# Patient Record
Sex: Female | Born: 1935
Health system: Southern US, Community
[De-identification: ages and names within clinical notes are randomized; demographics above are authoritative.]

## PROBLEM LIST (undated history)

## (undated) DIAGNOSIS — M545 Low back pain, unspecified: Secondary | ICD-10-CM

## (undated) DIAGNOSIS — E785 Hyperlipidemia, unspecified: Secondary | ICD-10-CM

## (undated) DIAGNOSIS — I872 Venous insufficiency (chronic) (peripheral): Secondary | ICD-10-CM

## (undated) DIAGNOSIS — F419 Anxiety disorder, unspecified: Secondary | ICD-10-CM

## (undated) DIAGNOSIS — I10 Essential (primary) hypertension: Secondary | ICD-10-CM

## (undated) DIAGNOSIS — I440 Atrioventricular block, first degree: Secondary | ICD-10-CM

## (undated) DIAGNOSIS — R42 Dizziness and giddiness: Secondary | ICD-10-CM

## (undated) DIAGNOSIS — I6529 Occlusion and stenosis of unspecified carotid artery: Secondary | ICD-10-CM

## (undated) HISTORY — PX: CHOLECYSTECTOMY: SHX55

## (undated) HISTORY — DX: Hyperlipidemia, unspecified: E78.5

## (undated) HISTORY — PX: BREAST EXCISIONAL BIOPSY: SUR124

## (undated) HISTORY — DX: Occlusion and stenosis of unspecified carotid artery: I65.29

## (undated) HISTORY — DX: Essential (primary) hypertension: I10

## (undated) HISTORY — PX: EYE SURGERY: SHX253

## (undated) HISTORY — PX: BREAST BIOPSY: SHX20

## (undated) HISTORY — DX: Atrioventricular block, first degree: I44.0

## (undated) HISTORY — PX: LUNG SURGERY: SHX703

## (undated) HISTORY — PX: ABDOMINAL HYSTERECTOMY: SHX81

---

## 1998-06-30 ENCOUNTER — Ambulatory Visit (HOSPITAL_COMMUNITY): Admission: RE | Admit: 1998-06-30 | Discharge: 1998-06-30 | Payer: Self-pay | Admitting: Family Medicine

## 1998-07-21 ENCOUNTER — Other Ambulatory Visit: Admission: RE | Admit: 1998-07-21 | Discharge: 1998-07-21 | Payer: Self-pay | Admitting: *Deleted

## 1998-08-03 ENCOUNTER — Encounter: Payer: Self-pay | Admitting: Family Medicine

## 1998-08-03 ENCOUNTER — Ambulatory Visit (HOSPITAL_COMMUNITY): Admission: RE | Admit: 1998-08-03 | Discharge: 1998-08-03 | Payer: Self-pay | Admitting: Family Medicine

## 1998-08-10 ENCOUNTER — Encounter: Payer: Self-pay | Admitting: Family Medicine

## 1998-08-10 ENCOUNTER — Ambulatory Visit (HOSPITAL_COMMUNITY): Admission: RE | Admit: 1998-08-10 | Discharge: 1998-08-10 | Payer: Self-pay | Admitting: Family Medicine

## 1999-08-09 ENCOUNTER — Encounter: Payer: Self-pay | Admitting: Family Medicine

## 1999-08-09 ENCOUNTER — Encounter: Admission: RE | Admit: 1999-08-09 | Discharge: 1999-08-09 | Payer: Self-pay | Admitting: Family Medicine

## 2000-01-26 ENCOUNTER — Encounter: Admission: RE | Admit: 2000-01-26 | Discharge: 2000-01-26 | Payer: Self-pay | Admitting: Family Medicine

## 2000-01-26 ENCOUNTER — Encounter: Payer: Self-pay | Admitting: Family Medicine

## 2000-06-12 ENCOUNTER — Other Ambulatory Visit: Admission: RE | Admit: 2000-06-12 | Discharge: 2000-06-12 | Payer: Self-pay | Admitting: *Deleted

## 2000-08-15 ENCOUNTER — Encounter: Admission: RE | Admit: 2000-08-15 | Discharge: 2000-08-15 | Payer: Self-pay | Admitting: Family Medicine

## 2000-08-15 ENCOUNTER — Encounter: Payer: Self-pay | Admitting: Family Medicine

## 2001-03-17 ENCOUNTER — Encounter: Payer: Self-pay | Admitting: Family Medicine

## 2001-03-17 ENCOUNTER — Encounter: Admission: RE | Admit: 2001-03-17 | Discharge: 2001-03-17 | Payer: Self-pay | Admitting: Family Medicine

## 2001-08-28 ENCOUNTER — Encounter: Admission: RE | Admit: 2001-08-28 | Discharge: 2001-08-28 | Payer: Self-pay | Admitting: Family Medicine

## 2001-08-28 ENCOUNTER — Encounter: Payer: Self-pay | Admitting: Family Medicine

## 2002-09-14 ENCOUNTER — Encounter: Admission: RE | Admit: 2002-09-14 | Discharge: 2002-09-14 | Payer: Self-pay | Admitting: Family Medicine

## 2002-09-14 ENCOUNTER — Encounter: Payer: Self-pay | Admitting: Family Medicine

## 2002-09-16 ENCOUNTER — Encounter: Admission: RE | Admit: 2002-09-16 | Discharge: 2002-09-16 | Payer: Self-pay | Admitting: Family Medicine

## 2002-09-16 ENCOUNTER — Encounter: Payer: Self-pay | Admitting: Family Medicine

## 2003-01-13 ENCOUNTER — Encounter: Payer: Self-pay | Admitting: Emergency Medicine

## 2003-01-13 ENCOUNTER — Emergency Department (HOSPITAL_COMMUNITY): Admission: EM | Admit: 2003-01-13 | Discharge: 2003-01-14 | Payer: Self-pay | Admitting: Emergency Medicine

## 2003-01-14 ENCOUNTER — Encounter: Payer: Self-pay | Admitting: General Surgery

## 2003-01-14 ENCOUNTER — Inpatient Hospital Stay (HOSPITAL_COMMUNITY): Admission: EM | Admit: 2003-01-14 | Discharge: 2003-01-17 | Payer: Self-pay | Admitting: Emergency Medicine

## 2003-04-22 ENCOUNTER — Other Ambulatory Visit: Admission: RE | Admit: 2003-04-22 | Discharge: 2003-04-22 | Payer: Self-pay | Admitting: Obstetrics & Gynecology

## 2003-09-23 ENCOUNTER — Encounter: Admission: RE | Admit: 2003-09-23 | Discharge: 2003-09-23 | Payer: Self-pay | Admitting: Family Medicine

## 2004-09-27 ENCOUNTER — Encounter: Admission: RE | Admit: 2004-09-27 | Discharge: 2004-09-27 | Payer: Self-pay | Admitting: Family Medicine

## 2005-04-19 ENCOUNTER — Emergency Department (HOSPITAL_COMMUNITY): Admission: EM | Admit: 2005-04-19 | Discharge: 2005-04-19 | Payer: Self-pay | Admitting: Emergency Medicine

## 2005-10-19 ENCOUNTER — Encounter: Admission: RE | Admit: 2005-10-19 | Discharge: 2005-10-19 | Payer: Self-pay | Admitting: Family Medicine

## 2006-02-19 ENCOUNTER — Ambulatory Visit: Payer: Self-pay | Admitting: Gastroenterology

## 2006-03-26 ENCOUNTER — Ambulatory Visit: Payer: Self-pay | Admitting: Gastroenterology

## 2006-03-26 ENCOUNTER — Encounter (INDEPENDENT_AMBULATORY_CARE_PROVIDER_SITE_OTHER): Payer: Self-pay | Admitting: *Deleted

## 2006-08-16 ENCOUNTER — Ambulatory Visit: Payer: Self-pay | Admitting: Internal Medicine

## 2006-11-05 ENCOUNTER — Encounter: Admission: RE | Admit: 2006-11-05 | Discharge: 2006-11-05 | Payer: Self-pay | Admitting: Family Medicine

## 2007-11-21 ENCOUNTER — Encounter: Admission: RE | Admit: 2007-11-21 | Discharge: 2007-11-21 | Payer: Self-pay | Admitting: Family Medicine

## 2008-11-22 ENCOUNTER — Encounter: Admission: RE | Admit: 2008-11-22 | Discharge: 2008-11-22 | Payer: Self-pay | Admitting: Family Medicine

## 2009-08-03 ENCOUNTER — Ambulatory Visit: Payer: Self-pay | Admitting: Vascular Surgery

## 2009-10-20 ENCOUNTER — Ambulatory Visit: Payer: Self-pay | Admitting: Vascular Surgery

## 2010-01-03 ENCOUNTER — Encounter: Admission: RE | Admit: 2010-01-03 | Discharge: 2010-01-03 | Payer: Self-pay | Admitting: Family Medicine

## 2010-06-07 ENCOUNTER — Ambulatory Visit: Payer: Self-pay | Admitting: Cardiology

## 2010-10-31 NOTE — Procedures (Signed)
LOWER EXTREMITY VENOUS REFLUX EXAM   INDICATION:  Telangiectasia and varicosity of right posterior calf.   EXAM:  Using color-flow imaging and pulse Doppler spectral analysis, the  right common femoral, superficial femoral, popliteal, posterior tibial,  greater and lesser saphenous veins are evaluated.  There is no evidence  suggesting deep venous insufficiency in the right lower extremity.   The right saphenofemoral junction is competent. The right GSV is  competent.   The right proximal short saphenous vein demonstrates competency.   GSV Diameter (used if found to be incompetent only)                                            Right    Left  Proximal Greater Saphenous Vein           cm       cm  Proximal-to-mid-thigh                     cm       cm  Mid thigh                                 cm       cm  Mid-distal thigh                          cm       cm  Distal thigh                              cm       cm  Knee                                      cm       cm   IMPRESSION:  1. The right greater saphenous vein shows no evidence of significant      reflux.  2. The right greater saphenous vein is not aneurysmal.  3. The right greater saphenous vein is not tortuous.  4. The deep venous system is competent on the right.  5. The right lesser saphenous vein is competent.   ___________________________________________  Larina Earthly, M.D.   AS/MEDQ  D:  08/03/2009  T:  08/04/2009  Job:  [id number]

## 2010-10-31 NOTE — Consult Note (Signed)
NEW PATIENT CONSULTATION   SINK, Myda E.  DOB:  January 27, 1935                                       08/03/2009  ZOXWR#:[id number]   The patient presents today for evaluation of lower extremity venous  pathology.  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  She has had multiple prior treatments with sclerotherapy, mainly by Dr.  Marcy Panning over the course of 10-15 years.  She presents today with  recurrent concerns of reticular vein in her posterior right calf which  has been treated in the past and also telangiectasia in both legs and  ankles.  She does not have any specific pain over this area.  She does  have heavy sensation over her ankles bilaterally that is somewhat worse  with standing.  She has not had any prior bleeding from these areas.   PAST MEDICAL HISTORY:  Significant for hypertension.  She does have a  history of oophorectomy and hysterectomy in 1974 and 1976 and also  cholecystectomy in 1985.  She does have a history of mitral valve  prolapse diagnosis in the past.   FAMILY HISTORY:  Is significant for premature atherosclerotic disease in  sister, mother and father.   SOCIAL HISTORY:  She is married.  She is retired.  She has one child.  She does not smoke, having quit in 1970s.  Does have one glass of  alcohol per day.   REVIEW OF SYSTEMS:  Negative for weight loss or weight gain.  She  reports her weight is 150 pounds.  She is 5 feet 5 inches tall.  HEART:  Negative other than the mitral valve prolapse in the past.  PULMONARY:  Negative.  GI:  Hiatal hernia.  GU:  Negative.  VASCULAR:  With discomfort in her legs with prolonged standing.  NEUROLOGIC:  Negative.  PSYCHIATRIC:  Negative.  SKIN:  Does have some prior small cutaneous cancers.   PHYSICAL EXAM.:  General:  A well-developed, well-nourished white female  in no acute distress.  Vital  signs:  Blood pressure is 180/100, heart  rate is 88, respirations 18.  HEENT:  Normal.  Musculoskeletal:  Shows  no major deformities or cyanosis.  Neurological:  No focal weakness or  paresthesias.  Skin:  Without ulcers or rashes.  She does have 2+  dorsalis pedis pulses bilaterally, 2+ radial pulses bilaterally.   She underwent noninvasive vascular lab study in our office.  This does  show no significant reflux in her great or superficial system.  On the  right she does have several small varicosities that are not in  communication with her greater small saphenous vein which is  representing the telangiectasia and reticular vein in her right  posterior calf.  I did discuss options with the patient explaining that  there was no significant danger related to these.  She has had multiple  sessions of sclerotherapy in the past for recurrent telangiectasia and  she is interested in proceeding with this in our office.  I explained  that this was performed by Clementeen Hoof, RN in our office and explained that  we would not use hypertonic saline which has been used by other  practitioners in the past due to the increased risk for staining and  also increased  pain.  She understands the procedure and potential out of  pocket expense and wishes to proceed at her convenience.     Larina Earthly, M.D.  Electronically Signed   TFE/MEDQ  D:  08/03/2009  T:  08/04/2009  Job:  [id number]   cc:   Levander Campion, M.D.

## 2010-11-03 NOTE — Consult Note (Signed)
   NAME:  Robin, [name] NO.:  [id number]   MEDICAL RECORD NO.:  [id number]                   PATIENT TYPE:  INP   LOCATION:  5705                                 FACILITY:  [facility]   PHYSICIAN:  Mark C. Ophelia Charter, M.D.                 DATE OF BIRTH:  1934-10-04   DATE OF CONSULTATION:  01/14/2003  DATE OF DISCHARGE:                                   CONSULTATION   REASON FOR CONSULTATION:  Fall off horse with compression fractures and  right fifth carpal fracture.   HISTORY OF PRESENT ILLNESS:  This 76 year old female was riding a horse when  it went through some bushes, and she was hit by a branch from a tree,  causing her to fall off the horse. She suffered L3 and T12 compression  fractures, less than 30%. She also suffered a right fifth metacarpal  fracture. The patient is neurologically intact. MRI scan has been performed.  There is some ecchymosis of right fingers, but sensation is intact. She has  periorbital ecchymosis on the right secondary to hitting the branch.   MRI scan is reviewed which shows no evidence of epidural hematoma. No cord  compression. The patient has some associated degenerative changes at 4-5  which is pre-existing.   PLAN:  TLSO extension orthosis. She should apply the brace before she gets  up. Once the brace is applied, she can be ambulatory, discontinue Foley,  discontinue IV/p.o. pain medications, followup in one week. X-ray department  is looking for x-rays of the hand from [place], and I would recommend leaving  her in the split, and we can follow up her hand injury next week in the  office.                                               Mark C. Ophelia Charter, M.D.    MCY/MEDQ  D:  01/14/2003  T:  01/15/2003  Job:  [id number]

## 2010-11-03 NOTE — Discharge Summary (Signed)
NAME:  Robin [name], Robin [name] NO.:  [id number]   MEDICAL RECORD NO.:  [id number]                   PATIENT TYPE:  INP   LOCATION:  5705                                 FACILITY:  [facility]   PHYSICIAN:  Jimmye Norman, M.D.                   DATE OF BIRTH:  08-30-1934   DATE OF ADMISSION:  01/14/2003  DATE OF DISCHARGE:  01/17/2003                                 DISCHARGE SUMMARY   FINAL DIAGNOSES:  1. Fall from a horse.  2. Right fifth metacarpal fracture.  3. A T12 compression fracture.  4. An L3 compression fracture.  5. Hypertension.  6. Hypokalemia.   HISTORY:  This is a 76 year old who fell off a horse at about 8:30 p.m. on  the night of admission.  She stated she struck her right face on a tree limb  and then fell off the horse to the left striking her right hand on the  ground first and then landing on the ground face first.  The patient denied  any loss of consciousness.  She stated she had some lower back pain and pain  in her right hand.  Because of these findings, she was brought to [facility]  Emergency Room where she was seen by Dr. Derrell Lolling and workup done.  She had  noted right fifth metacarpal fracture.  She also had questionable T12 and L3  compression fractures.  Subsequently, an MRI was ordered.  While in the  emergency room, Dr. Annell Greening was consulted and he came to see the patient.   HOSPITAL COURSE:  The patient had her right hand splinted by Dr. Ophelia Charter and  also was fitted for a TLSO brace per Dr. Ophelia Charter' orders.  An MRI was done,  which did reveal the L3 compression fracture and T12 compression fracture  with T12 being approximately 20% and L3 being approximately 10%.  The  patient was given pain medicines and she did well during her stay.  Her diet  was advanced to a regular diet as tolerated.  After the brace was applied,  she was told how to use it and she was up and out of bed with the brace on.  She continued to do well and  subsequently by January 17, 2003 she was ready  for discharge.  At this point, it was discussed with her that she needed to  wear the brace at all times while up and out of bed.  She had already been  given an appointment by Dr. Ophelia Charter to follow up with him on Wednesday.  She  was having no other complaints at this time and was doing well.  She did  have some constipation and it had not resolved at the time of discharge but  she was advised that if she did not get any better she should try a bottle  of magnesium citrate at home.  The patient was given a  prescription for  Vicoprofen one or two p.o. q.4-6h. p.r.n. for pain; 40 of these, no refills.  She was told to follow up with Dr. Ophelia Charter  as he has appointed.  She was told to call the trauma office if she should  have any questions or any problems.  There is no other reason to follow up  with her per the trauma service at this time.  The patient is subsequently  discharged home in satisfactory and stable condition.      Phineas Semen, P.A.                      Jimmye Norman, M.D.    CL/MEDQ  D:  01/17/2003  T:  01/18/2003  Job:  [id number]   cc:   Loraine Leriche C. Ophelia Charter, M.D.  [address]  [place], [place] [address]  Fax: [phone]

## 2010-11-03 NOTE — Assessment & Plan Note (Signed)
[facility]                           GASTROENTEROLOGY OFFICE NOTE   Robin [name], Robin [name]                MRN:          [id number]  DATE:02/19/2006                            DOB:          [birth year]    INDICATIONS:  Robin [name] is a 76 year old white female referred for  screening colonoscopy per the courtesy of Dr. Artis Flock.  She really denies GI  complaints.   On close questioning, the patient does have periodic reflux symptoms for  which she takes Tagamet.  She denies diarrhea, constipation, abdominal pain  or rectal bleeding.  She reports that she has done yearly hemoccult cards  which have been negative, had a negative flexible sigmoidoscopy several  years ago.  She never had full colonoscopy exam or barium enema.  Her  appetite is good and her weight is stable and she denies specific food  intolerances.  She has never had hepatitis or known hepatobiliary problems.  She does have mild lactose intolerance.   PAST MEDICAL HISTORY:  1. Remarkable for hypertension and angina pectoris but she denies having      had previous MI or CVAs.  2. She also suffers from mitral valve prolapse and has a history of benign      palpitations.  3. She has what sounds like pneumothorax at age 65 and had to have a      pleural adhesion operation by Dr. Laddie Aquas.  4. She has had previous cholecystectomy.  5. Hysterectomy.  6. Breast biopsy.   FAMILY HISTORY:  Remarkable for diabetes and atherosclerosis but no known  gastrointestinal problems.   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  She does not smoke or use ethanol.   MEDICATIONS:  1. Toprol 200 mg half a tablet twice a day.  2. Dyazide 25 mg a day.  3. Tiazac 180 mg a day.  4. Premarin 0.9 mg a day.  5. Synthroid 0.88 mg a day.  6. Potassium 20 mEq a day.  7. A variety of multivitamins.   She in the past has had reactions  to codeine and sulfa.   REVIEW OF SYSTEMS:  Noncontributory.   LABORATORY DATA:  Recent lab data per Dr. Artis Flock shows a normal CBC and  metabolic profile.   EXAM:  She is a healthy-appearing, attractive white female appearing younger  than her stated age.  I cannot appreciate stigmata of chronic liver disease.  She is 5 feet 5 inches tall and weighs 147 pounds.  Blood pressure is 138/84  and pulse was 90 and regular.  I could not appreciate thyromegaly or  lymphadenopathy.  Chest was entirely clear.  She appeared to be in a regular  rhythm at this time without significant murmurs, gallops or rubs to my exam.  I could not appreciate hepatosplenomegaly, abdominal masses or tenderness.  Peripheral extremities were unremarkable.  Mental status was clear.  Rectal  examination was deferred.   ASSESSMENT:  1. Need for screening colonoscopy because of her age.  2. Periodic acid reflux managed by over-the-counter H2 blockers.  3. Nonessential  hypertension with history of angina pectoris.  4. Status post cholecystectomy and hysterectomy.  5. Vague history of mitral valve prolapse and palpitations.  6. History of lactose intolerance.   RECOMMENDATIONS:  1. Outpatient colonoscopy at her convenience.  2. Continue other medications as per Dr. Artis Flock.  3. Reflux regime review with patient but I do not think she needs      endoscopic exam or regular PPI therapy at this time.                                   Vania Rea. Jarold Motto, MD, Clementeen Graham, [place]   DRP/MedQ  DD:  02/19/2006  DT:  02/19/2006  Job #:  [id number]   cc:   Quita Skye. Artis Flock, M.D.

## 2010-11-03 NOTE — H&P (Signed)
NAME:  Robin [name], Robin [name] NO.:  [id number]   MEDICAL RECORD NO.:  [id number]                   PATIENT TYPE:  INP   LOCATION:  5705                                 FACILITY:  [facility]   PHYSICIAN:  Angelia Mould. Derrell Lolling, M.D.             DATE OF BIRTH:  1935-02-09   DATE OF ADMISSION:  01/14/2003  DATE OF DISCHARGE:                                HISTORY & PHYSICAL   CHIEF COMPLAINT:  Fell from a horse.   HISTORY OF PRESENT ILLNESS:  This is a 76 year old white female who fell off  of a horse at about 8:30 p.m. last night.  She states that she struck her  right face on a tree limb and then fell off the horse to the left, striking  her right hand on the ground first and then landing on the ground face  first.  She denies loss of consciousness.  She states she has some lower  back pain, some pain in her right hand and left thumb.   She was initially evaluated at [facility] ER by Dr. Lynelle Doctor; Dr. Lynelle Doctor felt  that the patient needed to be admitted to the trauma service and transferred  the patient to [facility] ER following x-ray evaluation.  The patient has been  hemodynamically stable throughout and alert throughout.   PAST MEDICAL HISTORY:  1. Hypertension.  2. Remote history of mitral valve prolapse.  3. Hiatal hernia and gastroesophageal reflux disease.  4. She has had a hysterectomy, BSO, and appendectomy.  5. Status post open cholecystectomy.  6. Status post tonsillectomy.  7. Status post right pleurodesis at age 47 for spontaneous pneumothorax.   CURRENT MEDICATIONS:  1. Toprol 100 mg p.o. b.i.d.  2. Diltiazem 180 mg daily.  3. Synthroid 88 mcg daily.  4. Dyazide 25 mg daily.  5. Premarin 1.25 mg daily.   DRUG ALLERGIES:  1. CODEINE.  2. SULFA.  3. IVP DYE.   SOCIAL HISTORY:  The patient is here with her husband and daughter.  She  denies the use of tobacco but admits to drinking alcohol socially.  She has  had a tetanus shot today.   FAMILY  HISTORY:  Mother died of heart disease and hypertension.  Father died  of heart disease and diabetes.   REVIEW OF SYSTEMS:  All systems are reviewed and are noncontributory except  as described above.   PHYSICAL EXAMINATION:  GENERAL:  A pleasant, older-middle-aged woman in  minimal distress.  She is sitting up in bed.  VITAL SIGNS:  Pulse 88, blood pressure 154/32, respirations 20.  SKIN:  Warm and dry.  HEENT:  Normocephalic.  No lacerations.  There is some ecchymosis around the  right periorbital area but no edema.  There is no palpable facial fracture.  Eyes show intact extraocular movements.  Pupils are 3 mm and reactive to  light.  External auditory canals are clear.  There is no laceration of the  scalp or face.  Dental occlusion is good.  No dental injury.  NECK:  Supple and nontender.  There is no posterior tenderness to the neck.  She has good active and passive range of motion without any pain whatsoever.  Trachea is midline.  There is no swelling or crepitus or distended veins.  CHEST:  A well-healed right thoracotomy scar.  No crepitus or chest wall  tenderness.  LUNGS:  Clear.  HEART:  Regular rate and rhythm.  No detectable murmur.  BREASTS:  Not examined.  ABDOMEN:  Soft and nontender.  Active bowel sounds.  Liver and spleen not  enlarged.  No bruising or signs of trauma.  BACK:  She can sit up, and there is mild tenderness to palpate the lumbar  spine but no deformity or swelling.  The rest of the posterior back is  nontender.  GENITOURINARY:  A Foley catheter is in with clear urine visible.  There is  no blood at the urethral meatus.  Pelvis is stable and nontender to  compression.  EXTREMITIES:  There is a splint on the right hand, and neurovascular exam of  the right fingers is intact.  There is a little bit of swelling of the left  thumb with good range of motion there and no deformity.  NEUROLOGIC:  The patient is oriented times four.  She has excellent  memory.  She has normal motor and sensory exam of all four extremities and moves all  four extremities well.  Her speech is normal.  Her eyes are open.  VASCULAR:  The carotid, radial, and posterior tibial pulses are palpable  bilaterally.   ADMISSION DATA:  Sodium 130, potassium 2.5, BUN 9, glucose 151, hemoglobin  12.8, white blood cell count 18,400, platelet count 268,000.   X-rays show that the chest x-ray shows no active disease, perhaps some  slight interstitial changes.  X-ray of the extremities show a mid shaft  fracture of the right fifth metacarpal.  X-ray of the left thumb is  negative.  C-spine series is negative.  CT scan of the brain is normal.  CT  scan of the face is normal.  CT scan of the abdomen and pelvis is normal;  however, the patient refused IV contrast, so this was an uninfused study.  No obvious solid organ injury and clearly no free fluid or free air.   IMPRESSION:  1. Fall from horse.  2. Probable fracture of T12 and L3 (as seen on plain films, rule out     degenerative changes).  3. Fracture of the right fifth metacarpal, mid shaft.  4. Hypertension.  5. Hypokalemia.   PLAN:  1. The patient will be admitted to the hospital for pain control.  2.     Dr. Annell Greening is going to see her regarding the hand fracture and the T12     and L3 fractures.  3. We are going to get an MRI of the spine in the morning to clarify whether     these are old or new findings.  4. Potassium supplementation is ordered.                                                Angelia Mould. Derrell Lolling, M.D.    HMI/MEDQ  D:  01/14/2003  T:  01/14/2003  Job:  [id number]   cc:  Quita Skye Artis Flock, M.D.  [address]  [place]  [place] [address]  Fax: [phone]

## 2010-11-14 ENCOUNTER — Ambulatory Visit: Payer: Self-pay | Admitting: Cardiology

## 2010-12-05 ENCOUNTER — Encounter: Payer: Self-pay | Admitting: Cardiology

## 2010-12-11 ENCOUNTER — Encounter: Payer: Self-pay | Admitting: Cardiology

## 2010-12-11 ENCOUNTER — Ambulatory Visit (INDEPENDENT_AMBULATORY_CARE_PROVIDER_SITE_OTHER): Payer: Medicare Other | Admitting: Cardiology

## 2010-12-11 ENCOUNTER — Other Ambulatory Visit: Payer: Self-pay | Admitting: Family Medicine

## 2010-12-11 VITALS — BP 156/75 | HR 72 | Wt 159.0 lb

## 2010-12-11 DIAGNOSIS — Z1231 Encounter for screening mammogram for malignant neoplasm of breast: Secondary | ICD-10-CM

## 2010-12-11 DIAGNOSIS — I499 Cardiac arrhythmia, unspecified: Secondary | ICD-10-CM

## 2010-12-11 DIAGNOSIS — I119 Hypertensive heart disease without heart failure: Secondary | ICD-10-CM | POA: Insufficient documentation

## 2010-12-11 NOTE — Assessment & Plan Note (Signed)
This pleasant middle-aged woman has a history of essential hypertension.  We last saw her in December 2011.  At that time her blood pressure had improved significantly with the addition of Micardis.  Since last visit she's been feeling well.  She has not been having any chest pain or shortness of breath.  She has been traveling a lot and has been off her low cholesterol diet and has gained 4 pounds since last visit.

## 2010-12-11 NOTE — Progress Notes (Signed)
Robin [name] Date of Birth:  Dec 24, 1934 [facility] / [facility] [address]   [address] [place], [place]  [address] [phone]           Fax   [phone]  HPI: This pleasant 76 year old woman is seen for a scheduled followup office visit.  She has a history of essential hypertension.  We initially saw her in July 2011 at which time her systolic blood pressure was 190.  It was at that point we started Micardis 80 mg daily.  When she returned in December 2011 her blood pressure was significantly improved she's not having any side effects from her medication.  In addition to the Micardis the patient remains on Tiazac Dyazide and metoprolol.  She denies chest discomfort or shortness of breath.  His not having any palpitations dizziness or syncope.  Previous workup included a normal nuclear study done at [facility] vascular in October 2009.  She also had an echocardiogram in August of 2009 showing normal left ventricular structure and function and no evidence of mitral valve prolapse  Current Outpatient Prescriptions  Medication Sig Dispense Refill  . atorvastatin (LIPITOR) 10 MG tablet Take 10 mg by mouth daily. Takes off and on       . Cholecalciferol (VITAMIN D PO) Take 2,400 mg by mouth daily.        Marland Kitchen diltiazem (TIAZAC) 180 MG 24 hr capsule Take 180 mg by mouth daily.        Marland Kitchen estrogen-methylTESTOSTERone (ESTRATEST) 1.25-2.5 MG per tablet Take 1 tablet by mouth daily.        . Lutein 20 MG CAPS Take by mouth daily.        . metoprolol (TOPROL-XL) 100 MG 24 hr tablet Take 100 mg by mouth 2 (two) times daily.        Marland Kitchen telmisartan (MICARDIS) 80 MG tablet Take 80 mg by mouth daily.        . Triamterene-HCTZ (DYAZIDE PO) Take 25 mg by mouth daily.        Marland Kitchen VITAMIN A PO Take 5,000 mg by mouth daily.        Marland Kitchen DISCONTD: fish oil-omega-3 fatty acids 1000 MG capsule Take 1 g by mouth daily.          Allergies  Allergen Reactions  . Codeine   . Iodinated  Diagnostic Agents   . Pravastatin   . Sulfa Drugs Cross Reactors     Patient Active Problem List  Diagnoses  . Benign hypertensive heart disease without heart failure    History  Smoking status  . Never Smoker   Smokeless tobacco  . Not on file    History  Alcohol Use No    Family History  Problem Relation Age of Onset  . Hypertension Mother   . Stroke Mother   . Heart disease Mother   . Heart disease Father   . Hypertension Sister   . Cancer Sister   . Hypertension Brother   . Hypertension Sister   . Hypertension Sister     Review of Systems: The patient denies any heat or cold intolerance.  No weight gain or weight loss.  The patient denies headaches or blurry vision.  There is no cough or sputum production.  The patient denies dizziness.  There is no hematuria or hematochezia.  The patient denies any muscle aches or arthritis.  The patient denies any rash.  The patient denies frequent falling or instability.  There is no  history of depression or anxiety.  All other systems were reviewed and are negative.   Physical Exam: Filed Vitals:   12/11/10 1509  BP: 156/75  Pulse: 72  The general appearance reveals a well-developed well-nourished woman in no distress.The head and neck exam reveals pupils equal and reactive.  Extraocular movements are full.  There is no scleral icterus.  The mouth and pharynx are normal.  The neck is supple.  The carotids reveal no bruits.  The jugular venous pressure is normal.  The  thyroid is not enlarged.  There is no lymphadenopathy.  The chest is clear to percussion and auscultation.  There are no rales or rhonchi.  Expansion of the chest is symmetrical.  The precordium is quiet.  The first heart sound is normal.  The second heart sound is physiologically split.  There is no murmur gallop rub or click.  There is no abnormal lift or heave.  The abdomen is soft and nontender.  The bowel sounds are normal.  The liver and spleen are not enlarged.   There are no abdominal masses.  There are no abdominal bruits.  Extremities reveal good pedal pulses.  There is no phlebitis or edema.  There is no cyanosis or clubbing.  Strength is normal and symmetrical in all extremities.  There is no lateralizing weakness.  There are no sensory deficits.  The skin is warm and dry.  There is no rash.    Assessment / Plan: The patient's blood pressure still shows some evidence of whitecoat syndrome.  The patient states when she checks her blood pressure at home and is significantly improved.  We will continue her present medication and encourage better adherence to a low-salt diet and to try to lose weight through decreased portion size.  She'll return in 4 months for followup office visit

## 2011-01-08 ENCOUNTER — Ambulatory Visit
Admission: RE | Admit: 2011-01-08 | Discharge: 2011-01-08 | Disposition: A | Discharge status: Home / Self Care | Payer: Medicare Other | Source: Ambulatory Visit | Attending: Family Medicine | Admitting: Family Medicine

## 2011-01-08 DIAGNOSIS — Z1231 Encounter for screening mammogram for malignant neoplasm of breast: Secondary | ICD-10-CM

## 2011-04-20 ENCOUNTER — Telehealth: Payer: Self-pay | Admitting: Cardiology

## 2011-04-20 MED ORDER — TELMISARTAN 80 MG PO TABS: 80.0000 mg | ORAL_TABLET | Freq: Every day | ORAL | Status: DC

## 2011-04-20 NOTE — Telephone Encounter (Signed)
pt needs refill of mycardis 90 day supply, uses [facility]

## 2011-06-02 ENCOUNTER — Other Ambulatory Visit: Payer: Self-pay | Admitting: Cardiology

## 2011-06-13 ENCOUNTER — Ambulatory Visit (INDEPENDENT_AMBULATORY_CARE_PROVIDER_SITE_OTHER): Payer: Medicare Other | Admitting: Cardiology

## 2011-06-13 ENCOUNTER — Encounter: Payer: Self-pay | Admitting: Cardiology

## 2011-06-13 VITALS — BP 140/80 | HR 59 | Ht 65.0 in | Wt 155.0 lb

## 2011-06-13 DIAGNOSIS — I119 Hypertensive heart disease without heart failure: Secondary | ICD-10-CM

## 2011-06-13 DIAGNOSIS — E7800 Pure hypercholesterolemia, unspecified: Secondary | ICD-10-CM

## 2011-06-13 DIAGNOSIS — R002 Palpitations: Secondary | ICD-10-CM

## 2011-06-13 NOTE — Assessment & Plan Note (Signed)
The patient has not been having any chest pain or shortness of breath or recent palpitations

## 2011-06-13 NOTE — Progress Notes (Signed)
Robin [name] Date of Birth:  1935-02-23 [facility] / [facility] [address]   [address] [place], [place]  [address] [phone]           Fax   [phone]  History of Present Illness: This pleasant 76 year old woman with a history of palpitations high blood pressure and hypercholesterolemia is seen for a six-month followup office visit.  She has a long history of high blood pressure.  We initially saw her in July 2011.  She does not have any history of ischemic heart disease.  He had a previous normal nuclear stress test in August 2009 at [facility].  She also had a normal echocardiogram and normal cardiac catheterization.  Since last visit she's been doing well.  She's not been experiencing any palpitations.  She is getting over an upper respiratory infection which started a month ago.  She still has a nonproductive cough.  Not been running any fever and has been no sputum production.  She has a past history of elevated cholesterol.  Recently she has been off her statin therapy for uncertain reasons.  Current Outpatient Prescriptions  Medication Sig Dispense Refill  . atorvastatin (LIPITOR) 10 MG tablet Take 10 mg by mouth daily. Takes off and on       . CALCIUM PO Take by mouth daily.        . Cholecalciferol (VITAMIN D PO) Take 2,400 mg by mouth daily.        Marland Kitchen diltiazem (TIAZAC) 180 MG 24 hr capsule Take 180 mg by mouth daily.        Marland Kitchen estrogen-methylTESTOSTERone (ESTRATEST) 1.25-2.5 MG per tablet Take 1 tablet by mouth daily.        . fish oil-omega-3 fatty acids 1000 MG capsule Take 2 g by mouth 3 (three) times a week.        . Lutein 20 MG CAPS Take by mouth daily.        . metoprolol (TOPROL-XL) 100 MG 24 hr tablet Take 100 mg by mouth 2 (two) times daily.        Marland Kitchen telmisartan (MICARDIS) 80 MG tablet Take 1 tablet (80 mg total) by mouth daily.  90 tablet  3  . Triamterene-HCTZ (DYAZIDE PO) Take 25 mg by mouth daily.        Marland Kitchen  VITAMIN A PO Take 5,000 mg by mouth daily.          Allergies  Allergen Reactions  . Codeine   . Iodinated Diagnostic Agents   . Pravastatin   . Sulfa Drugs Cross Reactors     Patient Active Problem List  Diagnoses  . Benign hypertensive heart disease without heart failure    History  Smoking status  . Never Smoker   Smokeless tobacco  . Not on file    History  Alcohol Use No    Family History  Problem Relation Age of Onset  . Hypertension Mother   . Stroke Mother   . Heart disease Mother   . Heart disease Father   . Hypertension Sister   . Cancer Sister   . Hypertension Brother   . Hypertension Sister   . Hypertension Sister     Review of Systems: Constitutional: no fever chills diaphoresis or fatigue or change in weight.  Head and neck: no hearing loss, no epistaxis, no photophobia or visual disturbance. Respiratory: No cough, shortness of breath or wheezing. Cardiovascular: No chest pain peripheral edema, palpitations. Gastrointestinal: No  abdominal distention, no abdominal pain, no change in bowel habits hematochezia or melena. Genitourinary: No dysuria, no frequency, no urgency, no nocturia. Musculoskeletal:No arthralgias, no back pain, no gait disturbance or myalgias. Neurological: No dizziness, no headaches, no numbness, no seizures, no syncope, no weakness, no tremors. Hematologic: No lymphadenopathy, no easy bruising. Psychiatric: No confusion, no hallucinations, no sleep disturbance.    Physical Exam: Filed Vitals:   06/13/11 1430  BP: 178/88  Pulse: 59   I rechecked her blood pressure and it was down to 140/80 with relaxation.Pupils equal and reactive.   Extraocular Movements are full.  There is no scleral icterus.  The mouth and pharynx are normal.  The neck is supple.  The carotids reveal no bruits.  The jugular venous pressure is normal.  The thyroid is not enlarged.  There is no lymphadenopathy.  The head and neck exam reveals pupils equal  and reactive.  Extraocular movements are full.  There is no scleral icterus.  The mouth and pharynx are normal.  The neck is supple.  The carotids reveal no bruits.  The jugular venous pressure is normal.  The  thyroid is not enlarged.  There is no lymphadenopathy.  The chest is clear to percussion and auscultation.  There are no rales or rhonchi.  Expansion of the chest is symmetrical.  The precordium is quiet.  The first heart sound is normal.  The second heart sound is physiologically split.  There is no murmur gallop rub or click.  There is no abnormal lift or heave.  The abdomen is soft and nontender.  The bowel sounds are normal.  The liver and spleen are not enlarged.  There are no abdominal masses.  There are no abdominal bruits.  Extremities reveal good pedal pulses.  There is no phlebitis or edema.  There is no cyanosis or clubbing.  Strength is normal and symmetrical in all extremities.  There is no lateralizing weakness.  There are no sensory deficits.  The skin is warm and dry.  There is no rash.  EKG shows sinus bradycardia with first-degree AV block and no ischemic changes   Assessment / Plan: Continue present medication.  Return soon for fasting lab work.  Recheck in 6 months for followup office visit and EKG.  She was asking for a recommendation for a primary care physician in the [facility] area and I gave her the name of Dr. Caryn Bee little or his associates.

## 2011-06-13 NOTE — Patient Instructions (Signed)
Your physician wants you to return in 6 months, You will receive a reminder letter in the mail two months in advance. If you don't receive a letter, please call our office to schedule the follow-up appointment.  Your physician recommends that you return for a FASTING lipid profile: tomorrow between 8:30 -12

## 2011-06-13 NOTE — Assessment & Plan Note (Signed)
The patient had not been having any side effects from her low dose Crestor.  She has been off it for at least several weeks.  We will have her return in the near future for a fasting lipid panel and then decide about restarting her medication.

## 2011-06-14 ENCOUNTER — Other Ambulatory Visit (INDEPENDENT_AMBULATORY_CARE_PROVIDER_SITE_OTHER): Payer: Medicare Other | Admitting: *Deleted

## 2011-06-14 ENCOUNTER — Other Ambulatory Visit: Payer: Self-pay | Admitting: Cardiology

## 2011-06-14 DIAGNOSIS — I119 Hypertensive heart disease without heart failure: Secondary | ICD-10-CM

## 2011-06-14 DIAGNOSIS — R002 Palpitations: Secondary | ICD-10-CM

## 2011-06-14 DIAGNOSIS — E7800 Pure hypercholesterolemia, unspecified: Secondary | ICD-10-CM

## 2011-06-14 LAB — HEPATIC FUNCTION PANEL
ALT: 17 U/L (ref 0–35)
AST: 19 U/L (ref 0–37)
Albumin: 3.6 g/dL (ref 3.5–5.2)
Alkaline Phosphatase: 82 U/L (ref 39–117)
Bilirubin, Direct: 0.1 mg/dL (ref 0.0–0.3)
Total Bilirubin: 1 mg/dL (ref 0.3–1.2)
Total Protein: 6.8 g/dL (ref 6.0–8.3)

## 2011-06-14 LAB — LIPID PANEL
Cholesterol: 213 mg/dL — ABNORMAL HIGH (ref 0–200)
HDL: 32.6 mg/dL — ABNORMAL LOW (ref >39.00)
Total CHOL/HDL Ratio: 7
Triglycerides: 85 mg/dL (ref 0.0–149.0)
VLDL: 17 mg/dL (ref 0.0–40.0)

## 2011-06-14 LAB — BASIC METABOLIC PANEL WITH GFR
BUN: 15 mg/dL (ref 6–23)
CO2: 28 meq/L (ref 19–32)
Calcium: 9.7 mg/dL (ref 8.4–10.5)
Chloride: 101 meq/L (ref 96–112)
Creatinine, Ser: 0.9 mg/dL (ref 0.4–1.2)
GFR: 68.53 mL/min (ref >60.00)
Glucose, Bld: 96 mg/dL (ref 70–99)
Potassium: 3.5 meq/L (ref 3.5–5.1)
Sodium: 137 meq/L (ref 135–145)

## 2011-06-14 LAB — LDL CHOLESTEROL, DIRECT: Direct LDL: 161.5 mg/dL

## 2011-06-15 ENCOUNTER — Telehealth: Payer: Self-pay | Admitting: Cardiology

## 2011-06-15 ENCOUNTER — Telehealth: Payer: Self-pay | Admitting: *Deleted

## 2011-06-15 NOTE — Progress Notes (Signed)
lmtcb 06/15/11;aeh 

## 2011-06-15 NOTE — Telephone Encounter (Signed)
Message copied by Burnell Blanks on Fri Jun 15, 2011  2:33 PM ------      Message from: Cassell Clement      Created: Fri Jun 15, 2011 12:40 PM       Please report.  The cholesterol and LDL levels are too high.  She needs to take the Lipitor every day and not just on some days      The potassium level is borderline low at 3.5 so she needs to eat more high potassium foods

## 2011-06-15 NOTE — Telephone Encounter (Signed)
FU Call: pt returning call from our office regarding pt lab work. Please return pt call to discuss further.

## 2011-06-15 NOTE — Telephone Encounter (Signed)
Advised husband of labs and mailed copy

## 2011-06-15 NOTE — Telephone Encounter (Signed)
Advised husband of labs and mailed copy 

## 2011-06-22 ENCOUNTER — Telehealth: Payer: Self-pay | Admitting: Cardiology

## 2011-06-22 NOTE — Telephone Encounter (Signed)
Left message

## 2011-06-22 NOTE — Telephone Encounter (Signed)
Fu call °Pt returning your call  °

## 2011-06-22 NOTE — Telephone Encounter (Signed)
New msg Pt wanted to talk to you about her last blood work she had done. Please call

## 2011-06-22 NOTE — Telephone Encounter (Signed)
Faxed lab to PCP as requested

## 2012-01-03 ENCOUNTER — Other Ambulatory Visit: Payer: Self-pay | Admitting: Family Medicine

## 2012-01-03 DIAGNOSIS — Z1231 Encounter for screening mammogram for malignant neoplasm of breast: Secondary | ICD-10-CM

## 2012-01-18 ENCOUNTER — Ambulatory Visit: Payer: Medicare Other

## 2012-02-12 ENCOUNTER — Ambulatory Visit: Payer: Medicare Other

## 2012-02-14 ENCOUNTER — Other Ambulatory Visit: Payer: Self-pay | Admitting: Family Medicine

## 2012-02-14 ENCOUNTER — Ambulatory Visit
Admission: RE | Admit: 2012-02-14 | Discharge: 2012-02-14 | Disposition: A | Discharge status: Home / Self Care | Payer: Medicare Other | Source: Ambulatory Visit | Attending: Family Medicine | Admitting: Family Medicine

## 2012-02-14 DIAGNOSIS — Z1231 Encounter for screening mammogram for malignant neoplasm of breast: Secondary | ICD-10-CM

## 2012-05-01 ENCOUNTER — Telehealth: Payer: Self-pay | Admitting: Cardiology

## 2012-05-01 NOTE — Telephone Encounter (Signed)
New Problem:    Patient called in needing a refill of her telmisartan (MICARDIS) 80 MG tablet sent in to her pharmacy on file.  Also needs a weeks worth of pills called in to [facility] to hold her over until her mail order prescription arrives.  Please call back if you have any questions.

## 2012-05-02 MED ORDER — TELMISARTAN 80 MG PO TABS: 80.0000 mg | ORAL_TABLET | Freq: Every day | ORAL | Status: DC

## 2012-05-05 ENCOUNTER — Ambulatory Visit (INDEPENDENT_AMBULATORY_CARE_PROVIDER_SITE_OTHER): Payer: Medicare Other | Admitting: Cardiology

## 2012-05-05 ENCOUNTER — Encounter: Payer: Self-pay | Admitting: Cardiology

## 2012-05-05 VITALS — BP 145/76 | HR 54 | Resp 16 | Ht 65.0 in | Wt 154.0 lb

## 2012-05-05 DIAGNOSIS — I119 Hypertensive heart disease without heart failure: Secondary | ICD-10-CM

## 2012-05-05 DIAGNOSIS — E7800 Pure hypercholesterolemia, unspecified: Secondary | ICD-10-CM

## 2012-05-05 DIAGNOSIS — I491 Atrial premature depolarization: Secondary | ICD-10-CM

## 2012-05-05 NOTE — Assessment & Plan Note (Signed)
The patient's blood pressure today is elevated.  Physical examination reveals an accentuated aortic closure sound but no definite aortic insufficiency. We will update her echocardiogram

## 2012-05-05 NOTE — Patient Instructions (Signed)
Your physician has requested that you have an echocardiogram. Echocardiography is a painless test that uses sound waves to create images of your heart. It provides your doctor with information about the size and shape of your heart and how well your heart's chambers and valves are working. This procedure takes approximately one hour. There are no restrictions for this procedure.   Your physician recommends that you continue on your current medications as directed. Please refer to the Current Medication list given to you today.  Your physician wants you to follow-up in: 6 months. You will receive a reminder letter in the mail two months in advance. If you don't receive a letter, please call our office to schedule the follow-up appointment.  

## 2012-05-05 NOTE — Assessment & Plan Note (Signed)
The patient has a history of premature atrial contractions.  She's had a past history of symptomatic palpitations.  Her electrocardiogram today however did not show any premature beats.  Does have first-degree AV block

## 2012-05-05 NOTE — Progress Notes (Signed)
Robin [name] Date of Birth:  1934/07/16 [facility] [address] [place], [place]  [address] [phone]         Fax   [phone]  History of Present Illness: This pleasant 77 year old woman with a history of palpitations, high blood pressure and hypercholesterolemia is seen for a six-month followup office visit. She has a long history of high blood pressure. We initially saw her in July 2011. She does not have any history of ischemic heart disease.  She had a previous normal nuclear stress test in August 2009 at [facility]. She also had a normal echocardiogram and normal cardiac catheterization. Since last visit she's been doing well.  She has had some palpitations.  She states that she has white coat syndrome when she comes to the doctor's office.  She states that years ago she was told she had mitral valve prolapse.  She has been taking her Lipitor only Monday Wednesday and Friday.   Current Outpatient Prescriptions  Medication Sig Dispense Refill  . atorvastatin (LIPITOR) 10 MG tablet Take 10 mg by mouth daily. Takes off and on       . CALCIUM PO Take by mouth daily.        . Cholecalciferol (VITAMIN D PO) Take 2,400 mg by mouth daily.        Marland Kitchen diltiazem (TIAZAC) 180 MG 24 hr capsule Take 180 mg by mouth daily.        Marland Kitchen estrogen-methylTESTOSTERone (ESTRATEST) 1.25-2.5 MG per tablet Take 1 tablet by mouth daily.        . fish oil-omega-3 fatty acids 1000 MG capsule Take 2 g by mouth 3 (three) times a week.        . Lutein 20 MG CAPS Take by mouth daily.        . metoprolol (TOPROL-XL) 100 MG 24 hr tablet Take 100 mg by mouth 2 (two) times daily.        Marland Kitchen telmisartan (MICARDIS) 80 MG tablet Take 1 tablet (80 mg total) by mouth daily.  30 tablet  0  . Triamterene-HCTZ (DYAZIDE PO) Take 25 mg by mouth daily.        Marland Kitchen VITAMIN A PO Take 5,000 mg by mouth daily.          Allergies  Allergen Reactions  . Codeine   . Iodinated  Diagnostic Agents   . Pravastatin   . Sulfa Drugs Cross Reactors     Patient Active Problem List  Diagnosis  . Benign hypertensive heart disease without heart failure  . Hypercholesterolemia  . PAC (premature atrial contraction)    History  Smoking status  . Never Smoker   Smokeless tobacco  . Not on file    History  Alcohol Use No    Family History  Problem Relation Age of Onset  . Hypertension Mother   . Stroke Mother   . Heart disease Mother   . Heart disease Father   . Hypertension Sister   . Cancer Sister   . Hypertension Brother   . Hypertension Sister   . Hypertension Sister     Review of Systems: Constitutional: no fever chills diaphoresis or fatigue or change in weight.  Head and neck: no hearing loss, no epistaxis, no photophobia or visual disturbance. Respiratory: No cough, shortness of breath or wheezing. Cardiovascular: No chest pain peripheral edema, palpitations. Gastrointestinal: No abdominal distention, no abdominal pain, no change in bowel habits hematochezia or melena. Genitourinary: No dysuria,  no frequency, no urgency, no nocturia. Musculoskeletal:No arthralgias, no back pain, no gait disturbance or myalgias. Neurological: No dizziness, no headaches, no numbness, no seizures, no syncope, no weakness, no tremors. Hematologic: No lymphadenopathy, no easy bruising. Psychiatric: No confusion, no hallucinations, no sleep disturbance.    Physical Exam: Filed Vitals:   05/05/12 1417  BP: 145/76  Pulse: 54  Resp: 16   the general appearance reveals a well-developed well-nourished woman in no distress.The head and neck exam reveals pupils equal and reactive.  Extraocular movements are full.  There is no scleral icterus.  The mouth and pharynx are normal.  The neck is supple.  The carotids reveal no bruits.  The jugular venous pressure is normal.  The  thyroid is not enlarged.  There is no lymphadenopathy.  The chest is clear to percussion and  auscultation.  There are no rales or rhonchi.  Expansion of the chest is symmetrical.  The precordium is quiet.  The first heart sound is normal.  The second heart sound is accentuated. There is no murmur gallop rub or click.  There is no abnormal lift or heave.  The abdomen is soft and nontender.  The bowel sounds are normal.  The liver and spleen are not enlarged.  There are no abdominal masses.  There are no abdominal bruits.  Extremities reveal good pedal pulses.  There is no phlebitis or edema.  There is no cyanosis or clubbing.  Strength is normal and symmetrical in all extremities.  There is no lateralizing weakness.  There are no sensory deficits.  The skin is warm and dry.  There is no rash.   EKG today shows normal sinus rhythm and rightward axis and first-degree AV block  Assessment / Plan: Continue same medication.  Get two-dimensional echocardiogram.  Recheck 6 months

## 2012-05-05 NOTE — Assessment & Plan Note (Signed)
The patient is on low-dose Lipitor 10 mg 3 times a week.  She is not having any myalgias or side effects

## 2012-05-09 ENCOUNTER — Telehealth: Payer: Self-pay | Admitting: Cardiology

## 2012-05-09 ENCOUNTER — Ambulatory Visit (HOSPITAL_COMMUNITY): Payer: Medicare Other | Attending: Cardiology

## 2012-05-09 DIAGNOSIS — I491 Atrial premature depolarization: Secondary | ICD-10-CM

## 2012-05-09 DIAGNOSIS — R002 Palpitations: Secondary | ICD-10-CM | POA: Insufficient documentation

## 2012-05-09 DIAGNOSIS — I10 Essential (primary) hypertension: Secondary | ICD-10-CM | POA: Insufficient documentation

## 2012-05-09 DIAGNOSIS — I059 Rheumatic mitral valve disease, unspecified: Secondary | ICD-10-CM

## 2012-05-09 DIAGNOSIS — E7800 Pure hypercholesterolemia, unspecified: Secondary | ICD-10-CM | POA: Insufficient documentation

## 2012-05-09 DIAGNOSIS — I517 Cardiomegaly: Secondary | ICD-10-CM | POA: Insufficient documentation

## 2012-05-09 MED ORDER — TELMISARTAN 80 MG PO TABS: 80.0000 mg | ORAL_TABLET | Freq: Every day | ORAL | Status: DC

## 2012-05-09 NOTE — Telephone Encounter (Signed)
rx sent into pharmacy. LMOM 

## 2012-05-09 NOTE — Telephone Encounter (Signed)
NEW MESS:  Pt states she needs Micardis 80 mg CALLED into [facility].  90 day supply. Pt is out of this and needs a prescription called today.

## 2012-05-09 NOTE — Progress Notes (Signed)
Echocardiogram performed.  

## 2012-05-12 ENCOUNTER — Telehealth: Payer: Self-pay | Admitting: *Deleted

## 2012-05-12 NOTE — Telephone Encounter (Signed)
Message copied by Burnell Blanks on Mon May 12, 2012  3:36 PM ------      Message from: Cassell Clement      Created: Sun May 11, 2012  5:42 PM       Please report.  Echo is satisfactory.  LV normal. Mild MR. No serious problems. CSD

## 2012-05-12 NOTE — Telephone Encounter (Signed)
Advised of echo  

## 2012-08-06 ENCOUNTER — Other Ambulatory Visit: Payer: Self-pay | Admitting: *Deleted

## 2012-08-06 MED ORDER — TELMISARTAN 80 MG PO TABS: 80.0000 mg | ORAL_TABLET | Freq: Every day | ORAL | Status: DC

## 2012-11-11 ENCOUNTER — Ambulatory Visit (INDEPENDENT_AMBULATORY_CARE_PROVIDER_SITE_OTHER): Payer: Medicare Other | Admitting: Cardiology

## 2012-11-11 ENCOUNTER — Encounter: Payer: Self-pay | Admitting: Cardiology

## 2012-11-11 VITALS — BP 142/80 | HR 54 | Ht 65.0 in | Wt 159.8 lb

## 2012-11-11 DIAGNOSIS — E7800 Pure hypercholesterolemia, unspecified: Secondary | ICD-10-CM

## 2012-11-11 DIAGNOSIS — I119 Hypertensive heart disease without heart failure: Secondary | ICD-10-CM

## 2012-11-11 NOTE — Progress Notes (Signed)
Robin [name] Date of Birth:  Apr 27, 1935 [facility] [address] [place], [place]  [address] [phone]         Fax   [phone]  History of Present Illness: This pleasant 78 year old woman with a history of palpitations, high blood pressure and hypercholesterolemia is seen for a six-month followup office visit. She has a long history of high blood pressure. We initially saw her in July 2011. She does not have any history of ischemic heart disease. She had a previous normal nuclear stress test in August 2009 at [facility]. She also had a normal echocardiogram and normal cardiac catheterization. Since last visit she's been doing well. She has had some palpitations. She states that she has white coat syndrome when she comes to the doctor's office. She states that years ago she was told she had mitral valve prolapse. She has been taking her Lipitor only Monday Wednesday and Friday.  Since last visit she had an echocardiogram on 05/09/12 which showed an ejection fraction of 55-60% and showed mild mitral regurgitation.   Current Outpatient Prescriptions  Medication Sig Dispense Refill  . atorvastatin (LIPITOR) 10 MG tablet Take 10 mg by mouth daily. Takes off and on       . CALCIUM PO Take by mouth daily.        . Cholecalciferol (VITAMIN D PO) Take 2,400 mg by mouth daily.        Marland Kitchen diltiazem (TIAZAC) 180 MG 24 hr capsule Take 180 mg by mouth daily.        Marland Kitchen estrogen-methylTESTOSTERone (ESTRATEST) 1.25-2.5 MG per tablet Take 1 tablet by mouth daily.        . fish oil-omega-3 fatty acids 1000 MG capsule Take 2 g by mouth 3 (three) times a week.        . Lutein 20 MG CAPS Take by mouth daily.        . metoprolol (TOPROL-XL) 100 MG 24 hr tablet Take 100 mg by mouth 2 (two) times daily.        Marland Kitchen telmisartan (MICARDIS) 80 MG tablet Take 1 tablet (80 mg total) by mouth daily.  90 tablet  1  . Triamterene-HCTZ (DYAZIDE PO) Take 25 mg by mouth  daily.        Marland Kitchen VITAMIN A PO Take 5,000 mg by mouth daily.         No current facility-administered medications for this visit.    Allergies  Allergen Reactions  . Codeine   . Iodinated Diagnostic Agents   . Pravastatin   . Sulfa Drugs Cross Reactors     Patient Active Problem List   Diagnosis Date Noted  . PAC (premature atrial contraction) 05/05/2012  . Hypercholesterolemia 06/13/2011  . Benign hypertensive heart disease without heart failure 12/11/2010    History  Smoking status  . Never Smoker   Smokeless tobacco  . Not on file    History  Alcohol Use No    Family History  Problem Relation Age of Onset  . Hypertension Mother   . Stroke Mother   . Heart disease Mother   . Heart disease Father   . Hypertension Sister   . Cancer Sister   . Hypertension Brother   . Hypertension Sister   . Hypertension Sister     Review of Systems: Constitutional: no fever chills diaphoresis or fatigue or change in weight.  Head and neck: no hearing loss, no epistaxis, no photophobia or visual disturbance. Respiratory:  No cough, shortness of breath or wheezing. Cardiovascular: No chest pain peripheral edema, palpitations. Gastrointestinal: No abdominal distention, no abdominal pain, no change in bowel habits hematochezia or melena. Genitourinary: No dysuria, no frequency, no urgency, no nocturia. Musculoskeletal:No arthralgias, no back pain, no gait disturbance or myalgias. Neurological: No dizziness, no headaches, no numbness, no seizures, no syncope, no weakness, no tremors. Hematologic: No lymphadenopathy, no easy bruising. Psychiatric: No confusion, no hallucinations, no sleep disturbance.    Physical Exam: Filed Vitals:   11/11/12 1131  BP: 142/80  Pulse: 54   general appearance reveals a well-developed well-nourished woman in no distress.The head and neck exam reveals pupils equal and reactive.  Extraocular movements are full.  There is no scleral icterus.  The  mouth and pharynx are normal.  The neck is supple.  The carotids reveal no bruits.  The jugular venous pressure is normal.  The  thyroid is not enlarged.  There is no lymphadenopathy.  The chest is clear to percussion and auscultation.  There are no rales or rhonchi.  Expansion of the chest is symmetrical.  The precordium is quiet.  The first heart sound is normal.  The second heart sound is physiologically split.  There is no murmur gallop rub or click.  There is no abnormal lift or heave.  The abdomen is soft and nontender.  The bowel sounds are normal.  The liver and spleen are not enlarged.  There are no abdominal masses.  There are no abdominal bruits.  Extremities reveal good pedal pulses.  There is no phlebitis or edema.  There is no cyanosis or clubbing.  Strength is normal and symmetrical in all extremities.  There is no lateralizing weakness.  There are no sensory deficits.  The skin is warm and dry.  There is no rash.     Assessment / Plan: Continue on same medication.  Recheck in 6 months for office visit and EKG.

## 2012-11-11 NOTE — Assessment & Plan Note (Signed)
Patient has history of hypercholesterolemia.  She is on low-dose atorvastatin.  She has not been having any myalgias

## 2012-11-11 NOTE — Patient Instructions (Addendum)
Your physician recommends that you continue on your current medications as directed. Please refer to the Current Medication list given to you today.  Your physician wants you to follow-up in: 6 month ov/ekg You will receive a reminder letter in the mail two months in advance. If you don't receive a letter, please call our office to schedule the follow-up appointment.  

## 2012-11-11 NOTE — Assessment & Plan Note (Signed)
She checks her blood pressure at home.  Generally it runs in the range 120/70.  Occasionally the systolic will be down to 100 and on those days she does not feel as well.  I suggested to her that when her blood pressure is low she should increase her dietary salt intake or drink Gatorade.

## 2013-01-12 ENCOUNTER — Other Ambulatory Visit: Payer: Self-pay

## 2013-01-12 DIAGNOSIS — Z1231 Encounter for screening mammogram for malignant neoplasm of breast: Secondary | ICD-10-CM

## 2013-02-17 ENCOUNTER — Ambulatory Visit
Admission: RE | Admit: 2013-02-17 | Discharge: 2013-02-17 | Disposition: A | Discharge status: Home / Self Care | Payer: Medicare Other | Source: Ambulatory Visit

## 2013-02-17 DIAGNOSIS — Z1231 Encounter for screening mammogram for malignant neoplasm of breast: Secondary | ICD-10-CM

## 2013-04-21 ENCOUNTER — Encounter: Payer: Self-pay | Admitting: Cardiology

## 2013-04-21 ENCOUNTER — Ambulatory Visit (INDEPENDENT_AMBULATORY_CARE_PROVIDER_SITE_OTHER): Payer: Medicare Other | Admitting: Cardiology

## 2013-04-21 VITALS — BP 209/94 | HR 72 | Ht 65.0 in | Wt 154.0 lb

## 2013-04-21 DIAGNOSIS — I491 Atrial premature depolarization: Secondary | ICD-10-CM

## 2013-04-21 DIAGNOSIS — E28319 Asymptomatic premature menopause: Secondary | ICD-10-CM

## 2013-04-21 DIAGNOSIS — E7800 Pure hypercholesterolemia, unspecified: Secondary | ICD-10-CM

## 2013-04-21 DIAGNOSIS — I119 Hypertensive heart disease without heart failure: Secondary | ICD-10-CM

## 2013-04-21 MED ORDER — METOPROLOL TARTRATE 50 MG PO TABS
50.0000 mg | ORAL_TABLET | Freq: Once | ORAL | Status: AC
  Administered 2013-04-21: 50 mg via ORAL

## 2013-04-21 MED ORDER — AMLODIPINE BESYLATE 5 MG PO TABS: 5.0000 mg | ORAL_TABLET | Freq: Every day | ORAL | Status: DC

## 2013-04-21 NOTE — Assessment & Plan Note (Signed)
Patient has a history of hypercholesterolemia.  This is followed by her PCP

## 2013-04-21 NOTE — Assessment & Plan Note (Signed)
The patient has not been aware of any irregular heartbeat.  EKG today confirms normal sinus rhythm with first degree AV block

## 2013-04-21 NOTE — Assessment & Plan Note (Signed)
Pressure today was very high.  Checked it myself and the value was 208/100.  For better blood pressure control we will add amlodipine 5 mg one daily.  Here in the office we gave her an additional metoprolol 50 mg by mouth stat

## 2013-04-21 NOTE — Progress Notes (Signed)
Robin [name] Date of Birth:  18-Oct-1934 [address] [facility] [address] [place], [place]  [address] [phone]         Fax   [phone]  History of Present Illness: This pleasant 78 year old woman with a history of palpitations, high blood pressure and hypercholesterolemia is seen for a six-month followup office visit. She has a long history of high blood pressure. We initially saw her in July 2011. She does not have any history of ischemic heart disease. She had a previous normal nuclear stress test in August 2009 at [facility] vascular [facility]. She also had a normal echocardiogram and normal cardiac catheterization. Since last visit she's been doing well. She has had some palpitations. She states that she has white coat syndrome when she comes to the doctor's office. She states that years ago she was told she had mitral valve prolapse.   Since last visit she had an echocardiogram on 05/09/12 which showed an ejection fraction of 55-60% and showed mild mitral regurgitation.  Recently she has been having problems with poorly controlled high blood pressure.  She is on micardis and triamterene/HCTZ and Toprol.  She feels that her blood pressure elevation was triggered by a recent reduction in her estrogen replacement therapy.  She will be discussing this with her gynecologist Dr. Lloyd Huger  Current Outpatient Prescriptions  Medication Sig Dispense Refill  . CALCIUM PO Take by mouth daily.        . Cholecalciferol (VITAMIN D PO) Take 2,400 mg by mouth daily.        Marland Kitchen estradiol (CLIMARA - DOSED IN MG/24 HR) 0.05 mg/24hr patch       . fish oil-omega-3 fatty acids 1000 MG capsule Take 2 g by mouth 3 (three) times a week.        . fluticasone (FLONASE) 50 MCG/ACT nasal spray       . Lutein 20 MG CAPS Take by mouth daily.        . metoprolol (TOPROL-XL) 100 MG 24 hr tablet Take 100 mg by mouth 2 (two) times daily.        Marland Kitchen telmisartan (MICARDIS) 80 MG tablet Take 1 tablet (80 mg total) by  mouth daily.  90 tablet  1  . Triamterene-HCTZ (DYAZIDE PO) Take 25 mg by mouth daily.        Marland Kitchen VITAMIN A PO Take 5,000 mg by mouth daily.        Marland Kitchen amLODipine (NORVASC) 5 MG tablet Take 1 tablet (5 mg total) by mouth daily.  30 tablet  5   No current facility-administered medications for this visit.    Allergies  Allergen Reactions  . Codeine   . Iodinated Diagnostic Agents   . Pravastatin   . Sulfa Drugs Cross Reactors     Patient Active Problem List   Diagnosis Date Noted  . PAC (premature atrial contraction) 05/05/2012  . Hypercholesterolemia 06/13/2011  . Benign hypertensive heart disease without heart failure 12/11/2010    History  Smoking status  . Never Smoker   Smokeless tobacco  . Not on file    History  Alcohol Use No    Family History  Problem Relation Age of Onset  . Hypertension Mother   . Stroke Mother   . Heart disease Mother   . Heart disease Father   . Hypertension Sister   . Cancer Sister   . Hypertension Brother   . Hypertension Sister   . Hypertension Sister     Review  of Systems: Constitutional: no fever chills diaphoresis or fatigue or change in weight.  Head and neck: no hearing loss, no epistaxis, no photophobia or visual disturbance. Respiratory: No cough, shortness of breath or wheezing. Cardiovascular: No chest pain peripheral edema, palpitations. Gastrointestinal: No abdominal distention, no abdominal pain, no change in bowel habits hematochezia or melena. Genitourinary: No dysuria, no frequency, no urgency, no nocturia. Musculoskeletal:No arthralgias, no back pain, no gait disturbance or myalgias. Neurological: No dizziness, no headaches, no numbness, no seizures, no syncope, no weakness, no tremors. Hematologic: No lymphadenopathy, no easy bruising. Psychiatric: No confusion, no hallucinations, no sleep disturbance.    Physical Exam: Filed Vitals:   04/21/13 1005  BP: 209/94  Pulse: 72   general appearance reveals a  well-developed well-nourished woman in no distress.The head and neck exam reveals pupils equal and reactive.  Extraocular movements are full.  There is no scleral icterus.  The mouth and pharynx are normal.  The neck is supple.  The carotids reveal no bruits.  The jugular venous pressure is normal.  The  thyroid is not enlarged.  There is no lymphadenopathy.  The chest is clear to percussion and auscultation.  There are no rales or rhonchi.  Expansion of the chest is symmetrical.  The precordium is quiet.  The first heart sound is normal.  The second heart sound is physiologically split.  There is no murmur gallop rub or click.  There is no abnormal lift or heave.  The abdomen is soft and nontender.  The bowel sounds are normal.  The liver and spleen are not enlarged.  There are no abdominal masses.  There are no abdominal bruits.  Extremities reveal good pedal pulses.  There is no phlebitis or edema.  There is no cyanosis or clubbing.  Strength is normal and symmetrical in all extremities.  There is no lateralizing weakness.  There are no sensory deficits.  The skin is warm and dry.  There is no rash.  EKG shows normal sinus rhythm with first-degree AV block and no ischemic changes.   Assessment / Plan: For better blood pressure control we will add amlodipine 5 mg one daily.  If her blood pressure remains high she may need a higher dose of amlodipine subsequently.  She is anxious and I've encouraged her to take the lorazepam that she has on hand. Continue to avoid dietary salt. Recheck in several weeks for followup office visit

## 2013-04-21 NOTE — Patient Instructions (Signed)
START AMLODIPINE 5 MG DAILY, RX SENT TO [place]  Your physician recommends that you schedule a follow-up appointment in: 1-2 WEEKS

## 2013-04-27 ENCOUNTER — Telehealth: Payer: Self-pay | Admitting: Cardiology

## 2013-04-27 NOTE — Telephone Encounter (Signed)
Pt called because she states since she started taking Amlodipine 5 mg, she has been SOB tired; pt denies having LE edema pt's BP is 137/66.  Pt states she was taking Diltiazem 180 mg before, and the reason it was D/C was because her BP was slow, but she was taking so many BP medications. Pt would like to go back taking Diltiazem  Medication. Pt is aware of the  appointment she has on 05/07/13 with Dr. Patty Sermons.

## 2013-04-27 NOTE — Telephone Encounter (Signed)
New message    Talk to a nurse about amilodipine.

## 2013-04-28 NOTE — Telephone Encounter (Signed)
Okay to switch back to her diltiazem.

## 2013-04-28 NOTE — Telephone Encounter (Signed)
Left pt a detail message that Dr. Patty Sermons okay for pt to switch back to take diltiazem 180 mg once a day; and to stop taking the Amlodipine 5 mg. Pt to call back regarding the medication change.

## 2013-04-29 NOTE — Telephone Encounter (Signed)
Called back.  Had a question as to when she should take her Diltiazem.  States takes Metoprolol BID, Micardis around noon.  States doesn't like to take all BP med at same time because she is afraid that BP will drop.  Advised that she should take in the mornings.  She will monitor her BP and report to [place] if is low.  Also will call back when she needs refill on Diltiazem.  She agrees and verbalizes understanding.

## 2013-04-29 NOTE — Telephone Encounter (Signed)
Follow Up:  Pt is calling back to get instructions on how to take her medication. She wants to know if she takes it in the morning or at night, before or after food, etc... Please advise

## 2013-04-29 NOTE — Telephone Encounter (Signed)
lmtcb @8 :30

## 2013-04-30 ENCOUNTER — Telehealth: Payer: Self-pay

## 2013-04-30 MED ORDER — DILTIAZEM HCL ER COATED BEADS 120 MG PO CP24: 120.0000 mg | ORAL_CAPSULE | Freq: Every day | ORAL | Status: DC

## 2013-04-30 NOTE — Telephone Encounter (Signed)
Pt calls today b/c her blood pressure last pm after taking the Diltiazem 180  Was 109/58. She did not take her metoprolol last night because of this  She has not taken her blood pressure today. She would like Dr. Patty Sermons to decrease her Diltiazem to 90mg  CD  I will forward this to Dr. Patty Sermons for review. Pt would like new prescription called in to [facility]

## 2013-04-30 NOTE — Telephone Encounter (Signed)
I spoke with pt and she agrees with Dr. Patty Sermons Her current BP 140/70 & 131/65  Phoned in prescription of Diltiazem 120 ER as pt would like to start it tonight Mylo Red RN

## 2013-04-30 NOTE — Telephone Encounter (Signed)
Smallest CD is 120 mg CD.  Okay to send that in for her.

## 2013-04-30 NOTE — Addendum Note (Signed)
Addended by: Lisabeth Devoid F on: 04/30/2013 02:22 PM   Modules accepted: Orders

## 2013-04-30 NOTE — Telephone Encounter (Signed)
Transfer to triage

## 2013-04-30 NOTE — Addendum Note (Signed)
Addended by: Lisabeth Devoid F on: 04/30/2013 04:20 PM   Modules accepted: Orders

## 2013-05-06 NOTE — Telephone Encounter (Signed)
Patient has ov tomorrow

## 2013-05-07 ENCOUNTER — Ambulatory Visit (INDEPENDENT_AMBULATORY_CARE_PROVIDER_SITE_OTHER): Payer: Medicare Other | Admitting: Cardiology

## 2013-05-07 ENCOUNTER — Encounter: Payer: Self-pay | Admitting: Cardiology

## 2013-05-07 VITALS — BP 166/80 | HR 70 | Ht 65.0 in | Wt 156.0 lb

## 2013-05-07 DIAGNOSIS — I119 Hypertensive heart disease without heart failure: Secondary | ICD-10-CM

## 2013-05-07 DIAGNOSIS — R002 Palpitations: Secondary | ICD-10-CM

## 2013-05-07 DIAGNOSIS — E7800 Pure hypercholesterolemia, unspecified: Secondary | ICD-10-CM

## 2013-05-07 NOTE — Assessment & Plan Note (Signed)
The patient has been experiencing better blood pressure readings at home although her blood pressure is still quite labile.  Last night at home her blood pressure was 112/72.  Today in the office is elevated.  I rechecked it and got 166/80.  She does have an elevation of the white coat syndrome.  She has been watching her diet and avoiding salty foods.  She has been eating some sweets however and her weight is up 2 pounds

## 2013-05-07 NOTE — Assessment & Plan Note (Signed)
The patient has a past history of hypercholesterolemia which she is attempting to control with careful diet.

## 2013-05-07 NOTE — Patient Instructions (Signed)
Your physician recommends that you continue on your current medications as directed. Please refer to the Current Medication list given to you today.  Your physician recommends that you schedule a follow-up appointment in: 2 MONTH OV/EKG  

## 2013-05-07 NOTE — Progress Notes (Signed)
Robin [name] Date of Birth:  07-26-1934 [address] [facility] [address] [place], [place]  [address] [phone]         Fax   [phone]  History of Present Illness: This pleasant 78 year old woman with a history of palpitations, high blood pressure and hypercholesterolemia is seen for a six-month followup office visit. She has a long history of high blood pressure. We initially saw her in July 2011. She does not have any history of ischemic heart disease. She had a previous normal nuclear stress test in August 2009 at [facility] vascular [facility]. She also had a normal echocardiogram and normal cardiac catheterization. Since last visit she's been doing well. She has had some palpitations. She states that she has white coat syndrome when she comes to the doctor's office. She states that years ago she was told she had mitral valve prolapse.   Since last visit she had an echocardiogram on 05/09/12 which showed an ejection fraction of 55-60% and showed mild mitral regurgitation.  Recently she has been having problems with poorly controlled high blood pressure.  She is on micardis and triamterene/HCTZ and Toprol.  Since last visit she had a trial of amlodipine but was unable to tolerate it.  It caused shortness of breath.  Current Outpatient Prescriptions  Medication Sig Dispense Refill  . BILBERRY-BIOFLAV-RUTIN-QUERCET PO Take by mouth daily.      Marland Kitchen CALCIUM PO Take by mouth daily.        . Cholecalciferol (VITAMIN D PO) Take 2,400 mg by mouth daily.        Marland Kitchen diltiazem (CARDIZEM CD) 120 MG 24 hr capsule Take 1 capsule (120 mg total) by mouth daily.  90 capsule  2  . estradiol (CLIMARA - DOSED IN MG/24 HR) 0.05 mg/24hr patch Place 0.05 mg onto the skin once a week.       . fluticasone (FLONASE) 50 MCG/ACT nasal spray Place 2 sprays into both nostrils daily.       . Lutein 20 MG CAPS Take by mouth daily.       . metoprolol (TOPROL-XL) 100 MG 24 hr tablet Take 100 mg by mouth 2 (two)  times daily.        Marland Kitchen telmisartan (MICARDIS) 80 MG tablet Take 1 tablet (80 mg total) by mouth daily.  90 tablet  1  . Triamterene-HCTZ (DYAZIDE PO) Take 25 mg by mouth daily.        Marland Kitchen VITAMIN A PO Take 5,000 mg by mouth daily.        Marland Kitchen diltiazem (TIAZAC) 120 MG 24 hr capsule Take 120 mg by mouth daily.        No current facility-administered medications for this visit.    Allergies  Allergen Reactions  . Amlodipine     Shortness of breath  . Codeine   . Iodinated Diagnostic Agents   . Pravastatin   . Sulfa Drugs Cross Reactors     Patient Active Problem List   Diagnosis Date Noted  . PAC (premature atrial contraction) 05/05/2012  . Hypercholesterolemia 06/13/2011  . Benign hypertensive heart disease without heart failure 12/11/2010    History  Smoking status  . Never Smoker   Smokeless tobacco  . Not on file    History  Alcohol Use No    Family History  Problem Relation Age of Onset  . Hypertension Mother   . Stroke Mother   . Heart disease Mother   . Heart disease Father   .  Hypertension Sister   . Cancer Sister   . Hypertension Brother   . Hypertension Sister   . Hypertension Sister     Review of Systems: Constitutional: no fever chills diaphoresis or fatigue or change in weight.  Head and neck: no hearing loss, no epistaxis, no photophobia or visual disturbance. Respiratory: No cough, shortness of breath or wheezing. Cardiovascular: No chest pain peripheral edema, palpitations. Gastrointestinal: No abdominal distention, no abdominal pain, no change in bowel habits hematochezia or melena. Genitourinary: No dysuria, no frequency, no urgency, no nocturia. Musculoskeletal:No arthralgias, no back pain, no gait disturbance or myalgias. Neurological: No dizziness, no headaches, no numbness, no seizures, no syncope, no weakness, no tremors. Hematologic: No lymphadenopathy, no easy bruising. Psychiatric: No confusion, no hallucinations, no sleep  disturbance.    Physical Exam: Filed Vitals:   05/07/13 1057  BP: 166/80  Pulse:    general appearance reveals a well-developed well-nourished woman in no distress.The head and neck exam reveals pupils equal and reactive.  Extraocular movements are full.  There is no scleral icterus.  The mouth and pharynx are normal.  The neck is supple.  The carotids reveal no bruits.  The jugular venous pressure is normal.  The  thyroid is not enlarged.  There is no lymphadenopathy.  The chest is clear to percussion and auscultation.  There are no rales or rhonchi.  Expansion of the chest is symmetrical.  The precordium is quiet.  The first heart sound is normal.  The second heart sound is physiologically split.  There is no murmur gallop rub or click.  There is no abnormal lift or heave.  The abdomen is soft and nontender.  The bowel sounds are normal.  The liver and spleen are not enlarged.  There are no abdominal masses.  There are no abdominal bruits.  Extremities reveal good pedal pulses.  There is no phlebitis or edema.  There is no cyanosis or clubbing.  Strength is normal and symmetrical in all extremities.  There is no lateralizing weakness.  There are no sensory deficits.  The skin is warm and dry.  There is no rash.  EKG shows normal sinus rhythm with first-degree AV block and no ischemic changes.   Assessment / Plan: We had marked her chart allergic to amlodipine because it caused shortness of breath.  Her blood pressure at home is satisfactory on current therapy.  We will not add any additional medication at this time.  Continue to work harder on weight loss and prudent low-salt diet.  Recheck in 2 months for office visit and EKG.

## 2013-05-11 ENCOUNTER — Ambulatory Visit: Payer: Medicare Other | Admitting: Cardiology

## 2013-05-12 ENCOUNTER — Other Ambulatory Visit: Payer: Self-pay

## 2013-05-12 MED ORDER — TELMISARTAN 80 MG PO TABS: 80.0000 mg | ORAL_TABLET | Freq: Every day | ORAL | Status: DC

## 2013-06-01 ENCOUNTER — Other Ambulatory Visit: Payer: Self-pay | Admitting: Cardiology

## 2013-07-01 ENCOUNTER — Encounter: Payer: Self-pay | Admitting: Cardiology

## 2013-07-01 ENCOUNTER — Ambulatory Visit (INDEPENDENT_AMBULATORY_CARE_PROVIDER_SITE_OTHER): Payer: Medicare Other | Admitting: Cardiology

## 2013-07-01 VITALS — BP 180/72 | HR 82 | Ht 65.0 in | Wt 158.0 lb

## 2013-07-01 DIAGNOSIS — R002 Palpitations: Secondary | ICD-10-CM

## 2013-07-01 DIAGNOSIS — I119 Hypertensive heart disease without heart failure: Secondary | ICD-10-CM

## 2013-07-01 DIAGNOSIS — E7800 Pure hypercholesterolemia, unspecified: Secondary | ICD-10-CM

## 2013-07-01 NOTE — Patient Instructions (Signed)
Work on diet and weight loss  Your physician recommends that you continue on your current medications as directed. Please refer to the Current Medication list given to you today.  Your physician wants you to follow-up in: 3 month ov You will receive a reminder letter in the mail two months in advance. If you don't receive a letter, please call our office to schedule the follow-up appointment.

## 2013-07-01 NOTE — Assessment & Plan Note (Signed)
Patient has a history of hypercholesterolemia being treated with careful diet and attempts at weight loss.  Unfortunately her weight is up 4 pounds over the holidays.  The patient is walking a mile a day for exercise.

## 2013-07-01 NOTE — Progress Notes (Signed)
Robin [name] Date of Birth:  October 11, 1934 [place] [place], [place]  [address] [phone]         Fax   [phone]  History of Present Illness: This pleasant 79 year old woman with a history of palpitations, high blood pressure and hypercholesterolemia is seen for a six-month followup office visit. She has a long history of high blood pressure. We initially saw her in July 2011. She does not have any history of ischemic heart disease. She had a previous normal nuclear stress test in August 2009 at [facility]. She also had a normal echocardiogram and normal cardiac catheterization. Since last visit she's been doing well. She has had some palpitations. She states that she has white coat syndrome when she comes to the doctor's office. She states that years ago she was told she had mitral valve prolapse.   Since last visit she had an echocardiogram on 05/09/12 which showed an ejection fraction of 55-60% and showed mild mitral regurgitation.  Recently she has been having problems with poorly controlled high blood pressure.  She is on micardis and triamterene/HCTZ and Toprol.  She has had a trial of amlodipine but was unable to tolerate it.  It caused shortness of breath.  Since last visit she has been feeling reasonably well. Current Outpatient Prescriptions  Medication Sig Dispense Refill  . CALCIUM PO Take by mouth daily.        . Cholecalciferol (VITAMIN D PO) Take 2,400 mg by mouth daily.        Marland Kitchen diltiazem (TIAZAC) 120 MG 24 hr capsule Take 120 mg by mouth daily.       Marland Kitchen estradiol (CLIMARA - DOSED IN MG/24 HR) 0.05 mg/24hr patch Place 0.05 mg onto the skin once a week.       . Lutein 20 MG CAPS Take by mouth daily.       . metoprolol (TOPROL-XL) 100 MG 24 hr tablet Take 1 whole tab in the am and a  half in th pm      . telmisartan (MICARDIS) 80 MG tablet Take 1 tablet (80 mg total) by mouth daily.  90 tablet  2  . Triamterene-HCTZ (DYAZIDE  PO) Take 25 mg by mouth daily.        Marland Kitchen VITAMIN A PO Take 5,000 mg by mouth daily.         No current facility-administered medications for this visit.    Allergies  Allergen Reactions  . Amlodipine     Shortness of breath  . Codeine   . Iodinated Diagnostic Agents   . Pravastatin   . Sulfa Drugs Cross Reactors     Patient Active Problem List   Diagnosis Date Noted  . PAC (premature atrial contraction) 05/05/2012  . Hypercholesterolemia 06/13/2011  . Benign hypertensive heart disease without heart failure 12/11/2010    History  Smoking status  . Never Smoker   Smokeless tobacco  . Not on file    History  Alcohol Use No    Family History  Problem Relation Age of Onset  . Hypertension Mother   . Stroke Mother   . Heart disease Mother   . Heart disease Father   . Hypertension Sister   . Cancer Sister   . Hypertension Brother   . Hypertension Sister   . Hypertension Sister     Review of Systems: Constitutional: no fever chills diaphoresis or fatigue or change in weight.  Head and neck: no  hearing loss, no epistaxis, no photophobia or visual disturbance. Respiratory: No cough, shortness of breath or wheezing. Cardiovascular: No chest pain peripheral edema, palpitations. Gastrointestinal: No abdominal distention, no abdominal pain, no change in bowel habits hematochezia or melena. Genitourinary: No dysuria, no frequency, no urgency, no nocturia. Musculoskeletal:No arthralgias, no back pain, no gait disturbance or myalgias. Neurological: No dizziness, no headaches, no numbness, no seizures, no syncope, no weakness, no tremors. Hematologic: No lymphadenopathy, no easy bruising. Psychiatric: No confusion, no hallucinations, no sleep disturbance.    Physical Exam: Filed Vitals:   07/01/13 1357  BP: 180/72  Pulse: 82   general appearance reveals a well-developed well-nourished woman in no distress.The head and neck exam reveals pupils equal and reactive.   Extraocular movements are full.  There is no scleral icterus.  The mouth and pharynx are normal.  The neck is supple.  The carotids reveal no bruits.  The jugular venous pressure is normal.  The  thyroid is not enlarged.  There is no lymphadenopathy.  The chest is clear to percussion and auscultation.  There are no rales or rhonchi.  Expansion of the chest is symmetrical.  The precordium is quiet.  The first heart sound is normal.  The second heart sound is physiologically split.  There is no murmur gallop rub or click.  There is no abnormal lift or heave.  The abdomen is soft and nontender.  The bowel sounds are normal.  The liver and spleen are not enlarged.  There are no abdominal masses.  There are no abdominal bruits.  Extremities reveal good pedal pulses.  There is no phlebitis or edema.  There is no cyanosis or clubbing.  Strength is normal and symmetrical in all extremities.  There is no lateralizing weakness.  There are no sensory deficits.  The skin is warm and dry.  There is no rash.    Assessment / Plan: Overall patient is doing well.  She has white coat syndrome.  She will continue to monitor her blood pressures at home where they are quite satisfactory.  Recheck here in 3 months for followup office visit.  No change in meds today.Marland Kitchen

## 2013-07-01 NOTE — Assessment & Plan Note (Addendum)
Blood pressure on arrival here today was elevated.  At home her blood pressure runs in the 128/60 range.  I checked her blood pressure myself during the exam and obtained 158/80.  She attributes this to her known whitecoat syndrome.  The patient walks daily for exercise.  She has not been expressing any chest pain.  She notes occasional palpitations.

## 2013-07-30 ENCOUNTER — Other Ambulatory Visit: Payer: Self-pay | Admitting: Family Medicine

## 2013-07-30 DIAGNOSIS — E2839 Other primary ovarian failure: Secondary | ICD-10-CM

## 2013-07-30 DIAGNOSIS — Z1231 Encounter for screening mammogram for malignant neoplasm of breast: Secondary | ICD-10-CM

## 2013-09-03 ENCOUNTER — Other Ambulatory Visit: Payer: Self-pay | Admitting: Family Medicine

## 2013-09-03 DIAGNOSIS — H532 Diplopia: Secondary | ICD-10-CM

## 2013-09-04 ENCOUNTER — Ambulatory Visit
Admission: RE | Admit: 2013-09-04 | Discharge: 2013-09-04 | Disposition: A | Discharge status: Home / Self Care | Payer: 59 | Source: Ambulatory Visit | Attending: Family Medicine | Admitting: Family Medicine

## 2013-09-04 ENCOUNTER — Other Ambulatory Visit: Payer: Medicare Other

## 2013-09-04 DIAGNOSIS — H532 Diplopia: Secondary | ICD-10-CM

## 2013-10-16 ENCOUNTER — Telehealth: Payer: Self-pay | Admitting: Cardiology

## 2013-10-16 NOTE — Telephone Encounter (Signed)
New problem   Pt is having acute indigestion and want to come in office today to be seen,.

## 2013-10-16 NOTE — Telephone Encounter (Signed)
Patient states she is feeling some better. She has taken Prilosec and Nexium in the past and the Tagement helped the best so she will stick with this. PCP did recommend taking twice a day for a few days. Patient declined NTG at this time. Did advise to go to ED if worse, verbalized understanding.

## 2013-10-16 NOTE — Telephone Encounter (Signed)
Spoke with patient and she did see her PCP today who recommended Tagement or Mylanta and that he did not think it was a heart related issue but to call  Dr. Mare Ferrari if worse. PCP did not do an EKG. Patient states last night had heartburn and a lot of belching, even sore to touch.   Denies any radiating pain, just in center of breast bone. Pain was constant last night but eased up this am until she ate chicken noodle soup. She did take Tagement about 30 minutes before eating. She has no GI and does h/o of hiatal hernia. Patient thinks she may have some shortness of breath but she is unsure. Patient admits to being anxious. Has appointment next week to see  Dr. Mare Ferrari. Will forward to Tera Helper NP for review.

## 2013-10-16 NOTE — Telephone Encounter (Signed)
Start Protonix 40 mg a day  Give RX for sl NTG to have on hand.   To the ER if has further symptoms/concerns

## 2013-10-20 ENCOUNTER — Encounter: Payer: Self-pay | Admitting: Nurse Practitioner

## 2013-10-20 ENCOUNTER — Ambulatory Visit (INDEPENDENT_AMBULATORY_CARE_PROVIDER_SITE_OTHER): Payer: Medicare Other | Admitting: Nurse Practitioner

## 2013-10-20 ENCOUNTER — Telehealth: Payer: Self-pay | Admitting: *Deleted

## 2013-10-20 ENCOUNTER — Telehealth: Payer: Self-pay | Admitting: Cardiology

## 2013-10-20 VITALS — BP 170/80 | HR 68 | Ht 65.0 in | Wt 152.8 lb

## 2013-10-20 DIAGNOSIS — R0789 Other chest pain: Secondary | ICD-10-CM

## 2013-10-20 DIAGNOSIS — R0600 Dyspnea, unspecified: Secondary | ICD-10-CM

## 2013-10-20 DIAGNOSIS — R0609 Other forms of dyspnea: Secondary | ICD-10-CM

## 2013-10-20 DIAGNOSIS — R0989 Other specified symptoms and signs involving the circulatory and respiratory systems: Secondary | ICD-10-CM

## 2013-10-20 DIAGNOSIS — E876 Hypokalemia: Secondary | ICD-10-CM

## 2013-10-20 DIAGNOSIS — I491 Atrial premature depolarization: Secondary | ICD-10-CM

## 2013-10-20 LAB — CBC
HCT: 41.9 % (ref 36.0–46.0)
Hemoglobin: 14.3 g/dL (ref 12.0–15.0)
MCHC: 34.1 g/dL (ref 30.0–36.0)
MCV: 92.8 fl (ref 78.0–100.0)
Platelets: 305 10*3/uL (ref 150.0–400.0)
RBC: 4.52 Mil/uL (ref 3.87–5.11)
RDW: 12.2 % (ref 11.5–15.5)
WBC: 7.8 10*3/uL (ref 4.0–10.5)

## 2013-10-20 LAB — BASIC METABOLIC PANEL
BUN: 11 mg/dL (ref 6–23)
CO2: 30 mEq/L (ref 19–32)
Calcium: 10.7 mg/dL — ABNORMAL HIGH (ref 8.4–10.5)
Chloride: 97 mEq/L (ref 96–112)
Creatinine, Ser: 0.7 mg/dL (ref 0.4–1.2)
GFR: 89.29 mL/min (ref >60.00)
Glucose, Bld: 93 mg/dL (ref 70–99)
Potassium: 2.8 mEq/L — CL (ref 3.5–5.1)
Sodium: 134 mEq/L — ABNORMAL LOW (ref 135–145)

## 2013-10-20 LAB — BRAIN NATRIURETIC PEPTIDE: Pro B Natriuretic peptide (BNP): 124 pg/mL — ABNORMAL HIGH (ref 0.0–100.0)

## 2013-10-20 MED ORDER — POTASSIUM CHLORIDE ER 20 MEQ PO TBCR: EXTENDED_RELEASE_TABLET | ORAL | Status: DC

## 2013-10-20 NOTE — Telephone Encounter (Signed)
Pt states he had spoken with Rip Harbour Dr. Mare Ferrari nurse on 10/16/13 regarding pt having indigestion and SOB. Pt was order Protonix 40 mg once a day. Pt states her indigestion is better but she continues to be SOB. Pt states she would like to be seen today because, she would like to know if these symptoms aren't  her heart. An appointment was made for pt with Truitt Merle NP today at 2:30 Pm, pt is aware.

## 2013-10-20 NOTE — Patient Instructions (Signed)
I think you are doing ok  We will check some lab today to evaluate your shortness of breath  We will update your echocardiogram  We will change your visit with Dr. Mare Ferrari to 3 months  Stay on your current medicines for now  Call the [place] office at [phone] if you have any questions, problems or concerns.

## 2013-10-20 NOTE — Progress Notes (Signed)
[name] Date of Birth: 1935/02/14 Medical Record #[id number]  History of Present Illness: [name] is seen back today for a work in visit. Seen for Dr. Mare Ferrari. She has a history of palpitations from PACs, HTN and HLD. She has not tolerated Norvasc in the past. Normal nuclear stress test in 2009. Mild MR on echo from 2013 with a normal EF.   Last seen here in January.   Comes in today. Here alone. She notes that last week she had a "real pain" in the midsternal chest. Not really able to describe further. Does not remember what she was doing. Saw her PCP and found the area to be sore to touch - told it was her hiatal hernia. She is worried. Anxious. Waking up with anxiety. Also with more shortness of breath, especially with inclines, steps, etc. BP better at home. Walking 1 mile a day.   Current Outpatient Prescriptions  Medication Sig Dispense Refill  . CALCIUM PO Take by mouth daily.        . Cholecalciferol (VITAMIN D PO) Take 2,400 mg by mouth daily.        Marland Kitchen diltiazem (TIAZAC) 120 MG 24 hr capsule Take 120 mg by mouth daily.       Marland Kitchen estradiol (CLIMARA - DOSED IN MG/24 HR) 0.05 mg/24hr patch Place 0.05 mg onto the skin once a week.       . Lutein 20 MG CAPS Take by mouth daily.       . metoprolol (TOPROL-XL) 100 MG 24 hr tablet Take 1 whole tab in the am and a  half in th pm      . telmisartan (MICARDIS) 80 MG tablet Take 1 tablet (80 mg total) by mouth daily.  90 tablet  2  . Triamterene-HCTZ (DYAZIDE PO) Take 25 mg by mouth daily.        Marland Kitchen VITAMIN A PO Take 5,000 mg by mouth daily.         No current facility-administered medications for this visit.    Allergies  Allergen Reactions  . Amlodipine     Shortness of breath  . Codeine   . Iodinated Diagnostic Agents   . Pravastatin   . Sulfa Drugs Cross Reactors     Past Medical History  Diagnosis Date  . Hypertension   . Hyperlipidemia   . AV bloc first degree     Past Surgical History  Procedure Laterality  Date  . Cholecystectomy    . Lung surgery      History  Smoking status  . Never Smoker   Smokeless tobacco  . Not on file    History  Alcohol Use No    Family History  Problem Relation Age of Onset  . Hypertension Mother   . Stroke Mother   . Heart disease Mother   . Heart disease Father   . Hypertension Sister   . Cancer Sister   . Hypertension Brother   . Hypertension Sister   . Hypertension Sister     Review of Systems: The review of systems is per the HPI.  All other systems were reviewed and are negative.  Physical Exam: BP 170/80  Pulse 68  Ht 5\' 5"  (1.651 m)  Wt 152 lb 12.8 oz (69.31 kg)  BMI 25.43 kg/m2 Patient is very pleasant and in no acute distress. Does seem a little anxious. Skin is warm and dry. Color is normal.  HEENT is unremarkable. Normocephalic/atraumatic. PERRL. Sclera are nonicteric. Neck is  supple. No masses. No JVD. Lungs are clear. Cardiac exam shows a regular rate and rhythm. Abdomen is soft. Extremities are without edema. Gait and ROM are intact. No gross neurologic deficits noted.  LABORATORY DATA: EKG today shows sinus rhythm with 1st degree AV block   Lab Results  Component Value Date   GLUCOSE 96 06/14/2011   CHOL 213* 06/14/2011   TRIG 85.0 06/14/2011   HDL 32.60* 06/14/2011   LDLDIRECT 161.5 06/14/2011   ALT 17 06/14/2011   AST 19 06/14/2011   NA 137 06/14/2011   K 3.5 06/14/2011   CL 101 06/14/2011   CREATININE 0.9 06/14/2011   BUN 15 06/14/2011   CO2 28 06/14/2011   Echo Study Conclusions from November of 2013  - Left ventricle: The cavity size was normal. Wall thickness was normal. Systolic function was normal. The estimated ejection fraction was in the range of 55% to 60%. Wall motion was normal; there were no regional wall motion abnormalities. Left ventricular diastolic function parameters were normal. - Mitral valve: Mild regurgitation. - Left atrium: The atrium was mildly dilated. - Right atrium: The atrium  was mildly dilated. - Pulmonary arteries: Systolic pressure was mildly increased. PA peak pressure: 10mm Hg (S).    Assessment / Plan: 1. Atypical chest pain - sounds like this is musculoskeletal - she is reassured. EKG non acute.   2. HTN - bp better at home - has white coat syndrome - BP was 130/70 prior to coming here today. She will continue to monitor.   3. Dyspnea - will check echo. Check labs today as well.   4. Palpitations - does not seem to be an issue.  Will reschedule her visit that was for later this week with Dr. Mare Ferrari to 3 months, unless her studies are abnormal.   Patient is agreeable to this plan and will call if any problems develop in the interim.   Burtis Junes, RN, [facility] [address] [place], [place]  [address] [phone]

## 2013-10-20 NOTE — Telephone Encounter (Signed)
Potassium level 2.8. Dr Marlou Porch DOD recommended for pt to start Potassium supplement 40 mEq once daily, and to repeat BMET on Friday. Pt is aware to start taking this medication today. A prescription sent to [facility]. Pt has an appointment for labs this coming Friday 5/8/ 15 pt aware.

## 2013-10-20 NOTE — Telephone Encounter (Signed)
New problem   Pt is having indigestion and want to come in office today. Please call pt.

## 2013-10-22 ENCOUNTER — Ambulatory Visit: Payer: Medicare Other | Admitting: Cardiology

## 2013-10-23 ENCOUNTER — Other Ambulatory Visit (INDEPENDENT_AMBULATORY_CARE_PROVIDER_SITE_OTHER): Payer: Medicare Other

## 2013-10-23 DIAGNOSIS — E876 Hypokalemia: Secondary | ICD-10-CM

## 2013-10-23 LAB — BASIC METABOLIC PANEL
BUN: 14 mg/dL (ref 6–23)
CO2: 28 mEq/L (ref 19–32)
Calcium: 10.2 mg/dL (ref 8.4–10.5)
Chloride: 99 mEq/L (ref 96–112)
Creatinine, Ser: 0.9 mg/dL (ref 0.4–1.2)
GFR: 68.09 mL/min (ref >60.00)
Glucose, Bld: 93 mg/dL (ref 70–99)
Potassium: 3.9 mEq/L (ref 3.5–5.1)
Sodium: 134 mEq/L — ABNORMAL LOW (ref 135–145)

## 2013-11-04 ENCOUNTER — Ambulatory Visit (HOSPITAL_COMMUNITY): Payer: Medicare Other | Attending: Family Medicine | Admitting: Radiology

## 2013-11-04 DIAGNOSIS — R0600 Dyspnea, unspecified: Secondary | ICD-10-CM

## 2013-11-04 DIAGNOSIS — R072 Precordial pain: Secondary | ICD-10-CM

## 2013-11-04 DIAGNOSIS — I10 Essential (primary) hypertension: Secondary | ICD-10-CM | POA: Insufficient documentation

## 2013-11-04 DIAGNOSIS — R0609 Other forms of dyspnea: Secondary | ICD-10-CM | POA: Insufficient documentation

## 2013-11-04 DIAGNOSIS — R0989 Other specified symptoms and signs involving the circulatory and respiratory systems: Principal | ICD-10-CM | POA: Insufficient documentation

## 2013-11-04 DIAGNOSIS — I491 Atrial premature depolarization: Secondary | ICD-10-CM

## 2013-11-04 DIAGNOSIS — R0602 Shortness of breath: Secondary | ICD-10-CM

## 2013-11-04 NOTE — Progress Notes (Signed)
Echocardiogram performed.  

## 2013-11-05 NOTE — Telephone Encounter (Signed)
New message    Patient stating nurse Andee Poles called regarding test results.

## 2013-11-11 ENCOUNTER — Telehealth: Payer: Self-pay | Admitting: Cardiology

## 2013-11-11 NOTE — Telephone Encounter (Signed)
Spoke with patient and she has had anxiety attacks and had one last night. Had a nightmare and thinks this set it off and heart was racing. She took an old Lorazepam but thinks it has helped. Today she feels like she has been having a lot of irregular heart beats (like she's had in the past with her PAC's). Patient states she just can not seem to "shake it" this time. Blood pressure 121/62 HR 72. Currently taking Metoprolol 100 mg in the morning and 50 mg in the evening. Will forward to  Dr. Mare Ferrari for review

## 2013-11-11 NOTE — Telephone Encounter (Signed)
Advised patient

## 2013-11-11 NOTE — Telephone Encounter (Signed)
Would suggest that she talk to her PCP about getting some medication for anxiety

## 2013-11-11 NOTE — Telephone Encounter (Signed)
Follow up    Pt would like a call back about her Ultrasound test results please.

## 2014-01-17 ENCOUNTER — Other Ambulatory Visit: Payer: Self-pay | Admitting: Cardiology

## 2014-01-28 ENCOUNTER — Ambulatory Visit: Payer: Medicare Other | Admitting: Cardiology

## 2014-02-07 ENCOUNTER — Other Ambulatory Visit: Payer: Self-pay | Admitting: Cardiology

## 2014-02-18 ENCOUNTER — Ambulatory Visit
Admission: RE | Admit: 2014-02-18 | Discharge: 2014-02-18 | Disposition: A | Discharge status: Home / Self Care | Payer: Medicare Other | Source: Ambulatory Visit | Attending: Family Medicine | Admitting: Family Medicine

## 2014-02-18 DIAGNOSIS — E2839 Other primary ovarian failure: Secondary | ICD-10-CM

## 2014-02-18 DIAGNOSIS — Z1231 Encounter for screening mammogram for malignant neoplasm of breast: Secondary | ICD-10-CM

## 2014-03-01 ENCOUNTER — Other Ambulatory Visit: Payer: Self-pay | Admitting: Family Medicine

## 2014-03-01 DIAGNOSIS — R109 Unspecified abdominal pain: Secondary | ICD-10-CM

## 2014-03-02 ENCOUNTER — Ambulatory Visit (INDEPENDENT_AMBULATORY_CARE_PROVIDER_SITE_OTHER): Payer: Medicare Other | Admitting: Cardiology

## 2014-03-02 ENCOUNTER — Encounter: Payer: Self-pay | Admitting: Cardiology

## 2014-03-02 VITALS — BP 172/81 | HR 71 | Ht 65.0 in | Wt 150.0 lb

## 2014-03-02 DIAGNOSIS — E876 Hypokalemia: Secondary | ICD-10-CM

## 2014-03-02 DIAGNOSIS — R002 Palpitations: Secondary | ICD-10-CM

## 2014-03-02 DIAGNOSIS — I119 Hypertensive heart disease without heart failure: Secondary | ICD-10-CM

## 2014-03-02 DIAGNOSIS — I491 Atrial premature depolarization: Secondary | ICD-10-CM

## 2014-03-02 MED ORDER — POTASSIUM CHLORIDE ER 20 MEQ PO TBCR: EXTENDED_RELEASE_TABLET | ORAL | Status: DC

## 2014-03-02 NOTE — Assessment & Plan Note (Addendum)
She has had mild hypokalemia presumably secondary to her diuretic therapy.  She was seen in the work and office visit in May 2015 complaining of weakness and her potassium at that time was extremely low at 2.8.  3 days later after repletion and was up to 3.9.  She does not recall any history of diarrhea or excessive body fluid losses.

## 2014-03-02 NOTE — Progress Notes (Signed)
Robin [name] Date of Birth:  27-Jul-1934 Robin [name] [address] [place], [place]  [address] [phone]        Fax   [phone]   History of Present Illness: This pleasant 79 year old woman with a history of palpitations, high blood pressure and hypercholesterolemia is seen for a six-month followup office visit. She has a long history of high blood pressure. We initially saw her in July 2011. She does not have any history of ischemic heart disease. She had a previous normal nuclear stress test in August 2009 at [facility]. She also had a normal echocardiogram and normal cardiac catheterization. Since last visit she's been doing well. She has had some palpitations. She states that she has white coat syndrome when she comes to the doctor's office. She states that years ago she was told she had mitral valve prolapse. Since last visit she had an echocardiogram on 05/09/12 which showed an ejection fraction of 55-60% and showed mild mitral regurgitation.  Her last echocardiogram on 11/04/13 showed normal LV systolic function with an ejection fraction of 60-65% and there was grade 1 diastolic dysfunction.  She has a past history of hypokalemia and has been on potassium supplementation.   Current Outpatient Prescriptions  Medication Sig Dispense Refill  . BILBERRY-BIOFLAV-RUTIN-QUERCET PO Take 1,000 mg by mouth.      Marland Kitchen CALCIUM PO Take by mouth daily.        . Cholecalciferol (VITAMIN D PO) Take 2,400 mg by mouth daily.        Marland Kitchen diltiazem (TIAZAC) 120 MG 24 hr capsule TAKE (1) CAPSULE DAILY.  90 capsule  0  . estradiol (ESTRACE) 1 MG tablet Take 1 mg by mouth daily.      . Lutein 20 MG CAPS Take by mouth daily.       . metoprolol (TOPROL-XL) 100 MG 24 hr tablet 1/2 tab in am and 1/2 tab  in pm      . Potassium Chloride ER 20 MEQ TBCR One tablet daily  90 tablet  3  . telmisartan (MICARDIS) 80 MG tablet TAKE 1 TABLET ONCE DAILY.  90 tablet  0    . Triamterene-HCTZ (DYAZIDE PO) Take 25 mg by mouth daily.        Marland Kitchen VITAMIN A PO Take 5,000 mg by mouth daily.        . vitamin E (VITAMIN E) 400 UNIT capsule Take 400 Units by mouth daily.       No current facility-administered medications for this visit.    Allergies  Allergen Reactions  . Amlodipine     Shortness of breath  . Codeine   . Diphenhydramine   . Iodinated Diagnostic Agents   . Pravastatin   . Sulfa Antibiotics   . Sulfa Drugs Cross Reactors     Patient Active Problem List   Diagnosis Date Noted  . Hypokalemia 03/02/2014  . PAC (premature atrial contraction) 05/05/2012  . Hypercholesterolemia 06/13/2011  . Benign hypertensive heart disease without heart failure 12/11/2010    History  Smoking status  . Never Smoker   Smokeless tobacco  . Not on file    History  Alcohol Use No    Family History  Problem Relation Age of Onset  . Hypertension Mother   . Stroke Mother   . Heart disease Mother   . Heart disease Father   . Hypertension Sister   . Cancer Sister   . Hypertension Brother   .  Hypertension Sister   . Hypertension Sister     Review of Systems: Constitutional: no fever chills diaphoresis or fatigue or change in weight.  Head and neck: no hearing loss, no epistaxis, no photophobia or visual disturbance. Respiratory: No cough, shortness of breath or wheezing. Cardiovascular: No chest pain peripheral edema, palpitations. Gastrointestinal: No abdominal distention, no abdominal pain, no change in bowel habits hematochezia or melena. Genitourinary: No dysuria, no frequency, no urgency, no nocturia. Musculoskeletal:No arthralgias, no back pain, no gait disturbance or myalgias. Neurological: No dizziness, no headaches, no numbness, no seizures, no syncope, no weakness, no tremors. Hematologic: No lymphadenopathy, no easy bruising. Psychiatric: No confusion, no hallucinations, no sleep disturbance.    Physical Exam: Filed Vitals:   03/02/14  1508  BP: 172/81  Pulse: 71  The patient appears to be in no distress.  Head and neck exam reveals that the pupils are equal and reactive.  The extraocular movements are full.  There is no scleral icterus.  Mouth and pharynx are benign.  No lymphadenopathy.  No carotid bruits.  The jugular venous pressure is normal.  Thyroid is not enlarged or tender.  Chest is clear to percussion and auscultation.  No rales or rhonchi.  Expansion of the chest is symmetrical.  Heart reveals no abnormal lift or heave.  First and second heart sounds are normal.  There is no murmur gallop rub or click.  The abdomen is soft and nontender.  Bowel sounds are normoactive.  There is no hepatosplenomegaly or mass.  There are no abdominal bruits.  Extremities reveal no phlebitis or edema.  Pedal pulses are good.  There is no cyanosis or clubbing.  Neurologic exam is normal strength and no lateralizing weakness.  No sensory deficits.  Integument reveals no rash    Assessment / Plan: 1. hypertensive heart disease without heart failure. 2. palpitations and history of PACs. 3. Hypercholesterolemia 4. hypokalemia  Disposition: Continue current medications including potassium chloride 20 mEq daily. Return in 3 months for followup office visit EKG and basal metabolic panel.

## 2014-03-02 NOTE — Patient Instructions (Addendum)
Start taking your potassium 20 meq daily, rx sent to [facility] physician recommends that you schedule a follow-up appointment in: 3 month ov/ekg/bmet

## 2014-03-02 NOTE — Assessment & Plan Note (Signed)
She has an element of whitecoat syndrome and has been rushing around today.  At home her systolic blood pressures in the range of 125.  We will continue current antihypertensive therapy.

## 2014-03-02 NOTE — Assessment & Plan Note (Signed)
The patient has not been aware of any recent palpitations or tachycardia. 

## 2014-03-08 ENCOUNTER — Ambulatory Visit
Admission: RE | Admit: 2014-03-08 | Discharge: 2014-03-08 | Disposition: A | Discharge status: Home / Self Care | Payer: Medicare Other | Source: Ambulatory Visit | Attending: Family Medicine | Admitting: Family Medicine

## 2014-03-08 DIAGNOSIS — R109 Unspecified abdominal pain: Secondary | ICD-10-CM

## 2014-04-05 ENCOUNTER — Other Ambulatory Visit: Payer: Self-pay | Admitting: Cardiology

## 2014-04-30 ENCOUNTER — Other Ambulatory Visit: Payer: Self-pay | Admitting: Cardiology

## 2014-06-03 ENCOUNTER — Ambulatory Visit (INDEPENDENT_AMBULATORY_CARE_PROVIDER_SITE_OTHER): Payer: Medicare Other | Admitting: Cardiology

## 2014-06-03 ENCOUNTER — Encounter: Payer: Self-pay | Admitting: Cardiology

## 2014-06-03 ENCOUNTER — Other Ambulatory Visit (INDEPENDENT_AMBULATORY_CARE_PROVIDER_SITE_OTHER): Payer: Medicare Other | Admitting: *Deleted

## 2014-06-03 VITALS — BP 140/82 | HR 66 | Ht 65.0 in | Wt 154.0 lb

## 2014-06-03 DIAGNOSIS — E7800 Pure hypercholesterolemia, unspecified: Secondary | ICD-10-CM

## 2014-06-03 DIAGNOSIS — E876 Hypokalemia: Secondary | ICD-10-CM

## 2014-06-03 DIAGNOSIS — I119 Hypertensive heart disease without heart failure: Secondary | ICD-10-CM

## 2014-06-03 DIAGNOSIS — I491 Atrial premature depolarization: Secondary | ICD-10-CM

## 2014-06-03 DIAGNOSIS — I440 Atrioventricular block, first degree: Secondary | ICD-10-CM

## 2014-06-03 LAB — BASIC METABOLIC PANEL
BUN: 13 mg/dL (ref 6–23)
CO2: 27 mEq/L (ref 19–32)
Calcium: 10.2 mg/dL (ref 8.4–10.5)
Chloride: 101 mEq/L (ref 96–112)
Creatinine, Ser: 0.6 mg/dL (ref 0.4–1.2)
GFR: 99.17 mL/min (ref >60.00)
Glucose, Bld: 91 mg/dL (ref 70–99)
Potassium: 3.6 mEq/L (ref 3.5–5.1)
Sodium: 135 mEq/L (ref 135–145)

## 2014-06-03 NOTE — Progress Notes (Signed)
Robin [name] Date of Birth:  06/06/1935 [place] [address] [place], [place]  [address] [phone]        Fax   [phone]   History of Present Illness: This pleasant 79 year old woman with a history of palpitations, high blood pressure and hypercholesterolemia is seen for a six-month followup office visit. She has a long history of high blood pressure. We initially saw her in July 2011. She does not have any history of ischemic heart disease. She had a previous normal nuclear stress test in August 2009 at [facility]. She also had a normal echocardiogram and normal cardiac catheterization. Since last visit she's been doing well. She has had some palpitations. She states that she has white coat syndrome when she comes to the doctor's office. She states that years ago she was told she had mitral valve prolapse. Since last visit she had an echocardiogram on 05/09/12 which showed an ejection fraction of 55-60% and showed mild mitral regurgitation.   Her last echocardiogram on 11/04/13 showed normal LV systolic function with an ejection fraction of 60-65% and there was grade 1 diastolic dysfunction.   She has a past history of hypokalemia and has been on potassium supplementation.   Current Outpatient Prescriptions  Medication Sig Dispense Refill  . BILBERRY-BIOFLAV-RUTIN-QUERCET PO Take 1,000 mg by mouth.    Marland Kitchen CALCIUM PO Take by mouth daily.      . Cholecalciferol (VITAMIN D PO) Take 2,400 mg by mouth daily.      Marland Kitchen diltiazem (TIAZAC) 120 MG 24 hr capsule TAKE (1) CAPSULE DAILY. 90 capsule 3  . estradiol (ESTRACE) 1 MG tablet Take 1 mg by mouth daily.    . Lutein 20 MG CAPS Take by mouth daily.     . metoprolol (TOPROL-XL) 100 MG 24 hr tablet 1/2 tab in am and 1/2 tab  in pm    . Potassium Chloride ER 20 MEQ TBCR One tablet daily 90 tablet 3  . telmisartan (MICARDIS) 80 MG tablet TAKE 1 TABLET ONCE DAILY. 90 tablet 1  .  Triamterene-HCTZ (DYAZIDE PO) Take 25 mg by mouth daily.      Marland Kitchen VITAMIN A PO Take 5,000 mg by mouth daily.      . vitamin E (VITAMIN E) 400 UNIT capsule Take 400 Units by mouth 3 (three) times a week.      No current facility-administered medications for this visit.    Allergies  Allergen Reactions  . Amlodipine     Shortness of breath  . Codeine   . Diphenhydramine   . Iodinated Diagnostic Agents   . Pravastatin   . Sulfa Antibiotics   . Sulfa Drugs Cross Reactors     Patient Active Problem List   Diagnosis Date Noted  . First degree heart block by electrocardiogram 06/03/2014  . Hypokalemia 03/02/2014  . PAC (premature atrial contraction) 05/05/2012  . Hypercholesterolemia 06/13/2011  . Benign hypertensive heart disease without heart failure 12/11/2010    History  Smoking status  . Never Smoker   Smokeless tobacco  . Not on file    History  Alcohol Use No    Family History  Problem Relation Age of Onset  . Hypertension Mother   . Stroke Mother   . Heart disease Mother   . Heart disease Father   . Hypertension Sister   . Cancer Sister   . Hypertension Brother   . Hypertension Sister   . Hypertension Sister  Review of Systems: Constitutional: no fever chills diaphoresis or fatigue or change in weight.  Head and neck: no hearing loss, no epistaxis, no photophobia or visual disturbance. Respiratory: No cough, shortness of breath or wheezing. Cardiovascular: No chest pain peripheral edema, palpitations. Gastrointestinal: No abdominal distention, no abdominal pain, no change in bowel habits hematochezia or melena. Genitourinary: No dysuria, no frequency, no urgency, no nocturia. Musculoskeletal:No arthralgias, no back pain, no gait disturbance or myalgias. Neurological: No dizziness, no headaches, no numbness, no seizures, no syncope, no weakness, no tremors. Hematologic: No lymphadenopathy, no easy bruising. Psychiatric: No confusion, no hallucinations,  no sleep disturbance.    Physical Exam: Filed Vitals:   06/03/14 1357  BP: 140/82  Pulse: 66  The patient appears to be in no distress.  Head and neck exam reveals that the pupils are equal and reactive.  The extraocular movements are full.  There is no scleral icterus.  Mouth and pharynx are benign.  No lymphadenopathy.  No carotid bruits.  The jugular venous pressure is normal.  Thyroid is not enlarged or tender.  Chest is clear to percussion and auscultation.  No rales or rhonchi.  Expansion of the chest is symmetrical.  Heart reveals no abnormal lift or heave.  First and second heart sounds are normal.  There is no murmur gallop rub or click.  The abdomen is soft and nontender.  Bowel sounds are normoactive.  There is no hepatosplenomegaly or mass.  There are no abdominal bruits.  Extremities reveal no phlebitis or edema.  Pedal pulses are good.  There is no cyanosis or clubbing.  Neurologic exam is normal strength and no lateralizing weakness.  No sensory deficits.  Integument reveals no rash  EKG today shows normal sinus rhythm with first-degree AV block.  No ischemic changes.  Since last tracing of 10/20/13, the PR interval has improved   Assessment / Plan: 1. hypertensive heart disease without heart failure. 2. palpitations and history of PACs. 3. Hypercholesterolemia 4. Hypokalemia 5.  First-degree AV block  Disposition: Continue current medication.  We are checking a basal metabolic panel today.  Recheck in 6 months for office visit and basal metabolic panel.

## 2014-06-03 NOTE — Assessment & Plan Note (Signed)
Her blood pressure is remaining stable on current therapy.  No increased shortness of breath.  No dizziness or syncope.

## 2014-06-03 NOTE — Progress Notes (Signed)
Quick Note:  Please report to patient. The recent labs are stable. Continue same medication and careful diet. K is 3.6. Increase high potassium foods. ______

## 2014-06-03 NOTE — Assessment & Plan Note (Signed)
She notes occasional nonsustained palpitations.  She has had occasional PACs on previous EKGs.  No premature beats seen today.

## 2014-06-03 NOTE — Patient Instructions (Signed)
Your physician wants you to follow-up in: 6 months with Dr. Mare Ferrari. You will receive a reminder letter in the mail two months in advance. If you don't receive a letter, please call our office to schedule the follow-up appointment.  Your physician recommends that you continue on your current medications as directed. Please refer to the Current Medication list given to you today.

## 2014-06-03 NOTE — Assessment & Plan Note (Signed)
She is not having any symptoms of hypokalemia.  We are checking labs today.

## 2014-06-08 ENCOUNTER — Telehealth: Payer: Self-pay | Admitting: *Deleted

## 2014-06-08 NOTE — Telephone Encounter (Signed)
-----   Message from Darlin Coco, MD sent at 06/03/2014  9:19 PM EST ----- Please report to patient.  The recent labs are stable. Continue same medication and careful diet.  K is 3.6. Increase high potassium foods.

## 2014-06-08 NOTE — Telephone Encounter (Signed)
Pt notified of stable labs and to continue current med regimen and careful diet per Dr Mare Ferrari.  Informed the pt also that per Dr Mare Ferrari her K was 3.6 and he would like for her to increase the K in her foods.  Went over food choices high in K with the pt and informed her that I will mail her some resources for her review.  Pt verbalize understanding and agrees with this plan.

## 2014-08-19 ENCOUNTER — Telehealth: Payer: Self-pay | Admitting: Cardiology

## 2014-08-19 NOTE — Telephone Encounter (Signed)
New Msg      Pt calling, states she will having a procedure next Wednesday.  Pt has a question about Xanex .5 mg, states she will be given a dose before procedure.  Please return call.

## 2014-08-19 NOTE — Telephone Encounter (Signed)
Left message on VM (identified her) ok to take

## 2014-08-19 NOTE — Telephone Encounter (Signed)
Having a procedure at [facility] next week Rx given for Xanax and she wants to make sure ok to take Will forward to  Dr. Mare Ferrari for review

## 2014-08-19 NOTE — Telephone Encounter (Signed)
Okay for her to take the Xanax.

## 2014-10-24 ENCOUNTER — Other Ambulatory Visit: Payer: Self-pay | Admitting: Cardiology

## 2014-11-09 ENCOUNTER — Ambulatory Visit (INDEPENDENT_AMBULATORY_CARE_PROVIDER_SITE_OTHER): Payer: Medicare Other | Admitting: Cardiology

## 2014-11-09 ENCOUNTER — Encounter: Payer: Self-pay | Admitting: Cardiology

## 2014-11-09 VITALS — BP 140/80 | HR 69 | Ht 65.0 in | Wt 154.8 lb

## 2014-11-09 DIAGNOSIS — E876 Hypokalemia: Secondary | ICD-10-CM

## 2014-11-09 DIAGNOSIS — R002 Palpitations: Secondary | ICD-10-CM

## 2014-11-09 DIAGNOSIS — I119 Hypertensive heart disease without heart failure: Secondary | ICD-10-CM

## 2014-11-09 DIAGNOSIS — I872 Venous insufficiency (chronic) (peripheral): Secondary | ICD-10-CM

## 2014-11-09 NOTE — Patient Instructions (Signed)
Medication Instructions:  Your physician recommends that you continue on your current medications as directed. Please refer to the Current Medication list given to you today.  Labwork: bmet   Testing/Procedures: none  Follow-Up: Your physician wants you to follow-up in: 6 month ov/ekg/bmet You will receive a reminder letter in the mail two months in advance. If you don't receive a letter, please call our office to schedule the follow-up appointment.   Any Other Special Instructions Will Be Listed Below (If Applicable).

## 2014-11-09 NOTE — Progress Notes (Signed)
Cardiology Office Note   Date:  11/09/2014   ID:  Robin [name] [birth year], MRN [id number]  PCP:  Gavin Pound, MD  Cardiologist: Darlin Coco MD  No chief complaint on file.     History of Present Illness: Robin [name] is a 80 y.o. female who presents for a six-month follow-up office visit  This pleasant 80 year old woman with a history of palpitations, high blood pressure and hypercholesterolemia is seen for a six-month followup office visit. She has a long history of high blood pressure. We initially saw her in July 2011. She does not have any history of ischemic heart disease. She had a previous normal nuclear stress test in August 2009 at [facility]. She also had a normal echocardiogram and normal cardiac catheterization. Since last visit she's been doing well. She has had some palpitations. She states that she has white coat syndrome when she comes to the doctor's office. She states that years ago she was told she had mitral valve prolapse. Since last visit she had an echocardiogram on 05/09/12 which showed an ejection fraction of 55-60% and showed mild mitral regurgitation.  Her last echocardiogram on 11/04/13 showed normal LV systolic function with an ejection fraction of 60-65% and there was grade 1 diastolic dysfunction.  She has a past history of hypokalemia and has been on potassium supplementation.  We are checking a basal metabolic panel today. Overall she's been feeling better.  She is trying to walk a mile a day.  She had been having prior palpitations at night which stopped when she stopped eating a bedtime snack. She gives a history of having had vertigo in February and saw an ENT physician who diagnosed fluid in her right ear and placed her on a nasal spray. She has a history of venous insufficiency with reflux and is being treated at the vein center by Dr. Aleda Grana.  Past Medical History  Diagnosis Date  .  Hypertension   . Hyperlipidemia   . AV bloc first degree     Past Surgical History  Procedure Laterality Date  . Cholecystectomy    . Lung surgery       Current Outpatient Prescriptions  Medication Sig Dispense Refill  . ALPRAZolam (XANAX) 0.5 MG tablet Take 0.5 mg by mouth daily as needed (for anxiety).     Marland Kitchen BILBERRY-BIOFLAV-RUTIN-QUERCET PO Take 1,000 mg by mouth.    Marland Kitchen CALCIUM PO Take 1 tablet by mouth daily.     . Cholecalciferol (VITAMIN D PO) Take 2,400 mg by mouth daily.      Marland Kitchen diltiazem (TIAZAC) 120 MG 24 hr capsule Take 120 mg by mouth daily.    . diphenoxylate-atropine (LOMOTIL) 2.5-0.025 MG per tablet Take 1 tablet by mouth daily as needed (for diarrhea).     . estradiol (ESTRACE) 1 MG tablet Take 1 mg by mouth daily.    . Lutein 20 MG CAPS Take 1 capsule by mouth daily.     . metoprolol (TOPROL-XL) 100 MG 24 hr tablet Take by mouth daily. 1/2 tab in am and 1/2 tab  in pm    . Potassium Chloride ER 20 MEQ TBCR One tablet daily 90 tablet 3  . telmisartan (MICARDIS) 80 MG tablet Take 80 mg by mouth daily.    Marland Kitchen triamcinolone cream (KENALOG) 0.1 % Apply 1 application topically daily as needed (for rash).     . Triamterene-HCTZ (DYAZIDE PO) Take 25 mg by mouth daily.      Marland Kitchen  triamterene-hydrochlorothiazide (MAXZIDE-25) 37.5-25 MG per tablet Take 1 tablet by mouth daily.    Marland Kitchen VITAMIN A PO Take 5,000 mg by mouth daily.      . vitamin E (VITAMIN E) 400 UNIT capsule Take 400 Units by mouth 3 (three) times a week.      No current facility-administered medications for this visit.    Allergies:   Amlodipine; Codeine; Diphenhydramine; Iodinated diagnostic agents; Pravastatin; Sulfa antibiotics; and Sulfa drugs cross reactors    Social History:  The patient  reports that she has never smoked. She does not have any smokeless tobacco history on file. She reports that she does not drink alcohol or use illicit drugs.   Family History:  The patient's family history includes Cancer in  her sister; Heart disease in her father and mother; Hypertension in her brother, mother, sister, sister, and sister; Stroke in her mother.    ROS:  Please see the history of present illness.   Otherwise, review of systems are positive for none.   All other systems are reviewed and negative.    PHYSICAL EXAM: VS:  BP 140/80 mmHg  Pulse 69  Ht 5\' 5"  (1.651 m)  Wt 154 lb 12.8 oz (70.217 kg)  BMI 25.76 kg/m2 , BMI Body mass index is 25.76 kg/(m^2). GEN: Well nourished, well developed, in no acute distress HEENT: normal Neck: no JVD, carotid bruits, or masses Cardiac: RRR; no murmurs, rubs, or gallops,no edema  Respiratory:  clear to auscultation bilaterally, normal work of breathing GI: soft, nontender, nondistended, + BS MS: no deformity or atrophy Skin: warm and dry, no rash Neuro:  Strength and sensation are intact Psych: euthymic mood, full affect   EKG:  EKG is not ordered today.    Recent Labs: 06/03/2014: BUN 13; Creatinine 0.6; Potassium 3.6; Sodium 135    Lipid Panel    Component Value Date/Time   CHOL 213* 06/14/2011 0832   TRIG 85.0 06/14/2011 0832   HDL 32.60* 06/14/2011 0832   CHOLHDL 7 06/14/2011 0832   VLDL 17.0 06/14/2011 0832   LDLDIRECT 161.5 06/14/2011 0832      Wt Readings from Last 3 Encounters:  11/09/14 154 lb 12.8 oz (70.217 kg)  06/03/14 154 lb (69.854 kg)  03/02/14 150 lb (68.04 kg)        ASSESSMENT AND PLAN:  1. hypertensive heart disease without heart failure. 2. palpitations and history of PACs. 3. Hypercholesterolemia 4. Hypokalemia 5. First-degree AV block  Disposition: Continue current medication. We are checking a basal metabolic panel today. Recheck in 6 months for office visit and basal metabolic panel.   Current medicines are reviewed at length with the patient today.  The patient does not have concerns regarding medicines.  The following changes have been made:  no change  Labs/ tests ordered today include:    Orders Placed This Encounter  Procedures  . Basic metabolic panel  . Basic metabolic panel       Signed, Darlin Coco MD 11/09/2014 [facility] [place], [place], [place]  [address] Phone: [phone]; Fax: [phone]

## 2014-11-10 LAB — BASIC METABOLIC PANEL
BUN: 14 mg/dL (ref 6–23)
CO2: 32 mEq/L (ref 19–32)
Calcium: 9.8 mg/dL (ref 8.4–10.5)
Chloride: 98 mEq/L (ref 96–112)
Creatinine, Ser: 0.65 mg/dL (ref 0.40–1.20)
GFR: 93.8 mL/min (ref >60.00)
Glucose, Bld: 101 mg/dL — ABNORMAL HIGH (ref 70–99)
Potassium: 3.6 mEq/L (ref 3.5–5.1)
Sodium: 134 mEq/L — ABNORMAL LOW (ref 135–145)

## 2014-11-10 NOTE — Progress Notes (Signed)
Quick Note:  Please report to patient. The recent labs are stable. Continue same medication and careful diet. ______ 

## 2015-01-24 ENCOUNTER — Other Ambulatory Visit: Payer: Self-pay

## 2015-01-24 DIAGNOSIS — Z1231 Encounter for screening mammogram for malignant neoplasm of breast: Secondary | ICD-10-CM

## 2015-01-25 ENCOUNTER — Other Ambulatory Visit: Payer: Self-pay | Admitting: Cardiology

## 2015-01-25 NOTE — Telephone Encounter (Signed)
Is the patient still to be taking this? At her last office it is under the discontinued medication list. Please advise. Thanks, MI

## 2015-02-03 ENCOUNTER — Other Ambulatory Visit: Payer: Self-pay | Admitting: Family Medicine

## 2015-02-03 DIAGNOSIS — R911 Solitary pulmonary nodule: Secondary | ICD-10-CM

## 2015-03-03 ENCOUNTER — Ambulatory Visit
Admission: RE | Admit: 2015-03-03 | Discharge: 2015-03-03 | Disposition: A | Discharge status: Home / Self Care | Payer: Medicare Other | Source: Ambulatory Visit

## 2015-03-03 DIAGNOSIS — Z1231 Encounter for screening mammogram for malignant neoplasm of breast: Secondary | ICD-10-CM

## 2015-03-04 ENCOUNTER — Other Ambulatory Visit: Payer: Self-pay | Admitting: Family Medicine

## 2015-03-04 DIAGNOSIS — R928 Other abnormal and inconclusive findings on diagnostic imaging of breast: Secondary | ICD-10-CM

## 2015-03-10 ENCOUNTER — Ambulatory Visit
Admission: RE | Admit: 2015-03-10 | Discharge: 2015-03-10 | Disposition: A | Discharge status: Home / Self Care | Payer: Medicare Other | Source: Ambulatory Visit | Attending: Family Medicine | Admitting: Family Medicine

## 2015-03-10 ENCOUNTER — Other Ambulatory Visit: Payer: Self-pay | Admitting: Family Medicine

## 2015-03-10 DIAGNOSIS — R928 Other abnormal and inconclusive findings on diagnostic imaging of breast: Secondary | ICD-10-CM

## 2015-03-15 ENCOUNTER — Ambulatory Visit
Admission: RE | Admit: 2015-03-15 | Discharge: 2015-03-15 | Disposition: A | Discharge status: Home / Self Care | Payer: Medicare Other | Source: Ambulatory Visit | Attending: Family Medicine | Admitting: Family Medicine

## 2015-03-15 DIAGNOSIS — R911 Solitary pulmonary nodule: Secondary | ICD-10-CM

## 2015-03-17 ENCOUNTER — Ambulatory Visit
Admission: RE | Admit: 2015-03-17 | Discharge: 2015-03-17 | Disposition: A | Discharge status: Home / Self Care | Payer: Medicare Other | Source: Ambulatory Visit | Attending: Family Medicine | Admitting: Family Medicine

## 2015-03-17 ENCOUNTER — Other Ambulatory Visit: Payer: Self-pay | Admitting: Family Medicine

## 2015-03-17 DIAGNOSIS — R928 Other abnormal and inconclusive findings on diagnostic imaging of breast: Secondary | ICD-10-CM

## 2015-03-18 ENCOUNTER — Ambulatory Visit
Admission: RE | Admit: 2015-03-18 | Discharge: 2015-03-18 | Disposition: A | Discharge status: Home / Self Care | Payer: Medicare Other | Source: Ambulatory Visit | Attending: Family Medicine | Admitting: Family Medicine

## 2015-03-18 DIAGNOSIS — R928 Other abnormal and inconclusive findings on diagnostic imaging of breast: Secondary | ICD-10-CM

## 2015-03-26 ENCOUNTER — Other Ambulatory Visit: Payer: Self-pay | Admitting: Cardiology

## 2015-03-28 ENCOUNTER — Other Ambulatory Visit: Payer: Self-pay | Admitting: Cardiology

## 2015-03-30 ENCOUNTER — Other Ambulatory Visit: Payer: Self-pay | Admitting: General Surgery

## 2015-03-30 DIAGNOSIS — N6022 Fibroadenosis of left breast: Secondary | ICD-10-CM

## 2015-04-07 ENCOUNTER — Other Ambulatory Visit: Payer: Self-pay | Admitting: General Surgery

## 2015-04-07 DIAGNOSIS — N6022 Fibroadenosis of left breast: Secondary | ICD-10-CM

## 2015-04-29 ENCOUNTER — Encounter (HOSPITAL_BASED_OUTPATIENT_CLINIC_OR_DEPARTMENT_OTHER): Payer: Self-pay | Admitting: *Deleted

## 2015-04-29 ENCOUNTER — Encounter: Payer: Self-pay | Admitting: Cardiology

## 2015-05-02 ENCOUNTER — Ambulatory Visit
Admission: RE | Admit: 2015-05-02 | Discharge: 2015-05-02 | Disposition: A | Discharge status: Home / Self Care | Payer: Medicare Other | Source: Ambulatory Visit | Attending: General Surgery | Admitting: General Surgery

## 2015-05-02 ENCOUNTER — Encounter (HOSPITAL_BASED_OUTPATIENT_CLINIC_OR_DEPARTMENT_OTHER)
Admission: RE | Admit: 2015-05-02 | Discharge: 2015-05-02 | Disposition: A | Discharge status: Home / Self Care | Payer: Medicare Other | Source: Ambulatory Visit | Attending: General Surgery | Admitting: General Surgery

## 2015-05-02 DIAGNOSIS — Z882 Allergy status to sulfonamides status: Secondary | ICD-10-CM | POA: Diagnosis not present

## 2015-05-02 DIAGNOSIS — Z91041 Radiographic dye allergy status: Secondary | ICD-10-CM | POA: Diagnosis not present

## 2015-05-02 DIAGNOSIS — N6022 Fibroadenosis of left breast: Secondary | ICD-10-CM | POA: Diagnosis not present

## 2015-05-02 DIAGNOSIS — Z888 Allergy status to other drugs, medicaments and biological substances status: Secondary | ICD-10-CM | POA: Diagnosis not present

## 2015-05-02 DIAGNOSIS — M1990 Unspecified osteoarthritis, unspecified site: Secondary | ICD-10-CM | POA: Diagnosis not present

## 2015-05-02 DIAGNOSIS — E7800 Pure hypercholesterolemia, unspecified: Secondary | ICD-10-CM | POA: Diagnosis not present

## 2015-05-02 DIAGNOSIS — I10 Essential (primary) hypertension: Secondary | ICD-10-CM | POA: Diagnosis not present

## 2015-05-02 DIAGNOSIS — F419 Anxiety disorder, unspecified: Secondary | ICD-10-CM | POA: Diagnosis not present

## 2015-05-02 DIAGNOSIS — Z885 Allergy status to narcotic agent status: Secondary | ICD-10-CM | POA: Diagnosis not present

## 2015-05-02 DIAGNOSIS — Z87442 Personal history of urinary calculi: Secondary | ICD-10-CM | POA: Diagnosis not present

## 2015-05-02 DIAGNOSIS — R011 Cardiac murmur, unspecified: Secondary | ICD-10-CM | POA: Diagnosis not present

## 2015-05-02 LAB — BASIC METABOLIC PANEL
Anion gap: 5 (ref 5–15)
BUN: 12 mg/dL (ref 6–20)
CO2: 30 mmol/L (ref 22–32)
Calcium: 10 mg/dL (ref 8.9–10.3)
Chloride: 102 mmol/L (ref 101–111)
Creatinine, Ser: 0.75 mg/dL (ref 0.44–1.00)
GFR calc Af Amer: >60 mL/min (ref >60)
GFR calc non Af Amer: >60 mL/min (ref >60)
Glucose, Bld: 100 mg/dL — ABNORMAL HIGH (ref 65–99)
Potassium: 4.3 mmol/L (ref 3.5–5.1)
Sodium: 137 mmol/L (ref 135–145)

## 2015-05-05 ENCOUNTER — Ambulatory Visit
Admission: RE | Admit: 2015-05-05 | Discharge: 2015-05-05 | Disposition: A | Discharge status: Home / Self Care | Payer: Medicare Other | Source: Ambulatory Visit | Attending: General Surgery | Admitting: General Surgery

## 2015-05-05 ENCOUNTER — Ambulatory Visit (HOSPITAL_BASED_OUTPATIENT_CLINIC_OR_DEPARTMENT_OTHER): Payer: Medicare Other | Admitting: Anesthesiology

## 2015-05-05 ENCOUNTER — Encounter (HOSPITAL_BASED_OUTPATIENT_CLINIC_OR_DEPARTMENT_OTHER): Payer: Self-pay | Admitting: Anesthesiology

## 2015-05-05 ENCOUNTER — Encounter (HOSPITAL_BASED_OUTPATIENT_CLINIC_OR_DEPARTMENT_OTHER)
Admission: RE | Disposition: A | Discharge status: Home / Self Care | Payer: Self-pay | Source: Ambulatory Visit | Attending: General Surgery

## 2015-05-05 ENCOUNTER — Ambulatory Visit (HOSPITAL_BASED_OUTPATIENT_CLINIC_OR_DEPARTMENT_OTHER)
Admission: RE | Admit: 2015-05-05 | Discharge: 2015-05-05 | Disposition: A | Discharge status: Home / Self Care | Payer: Medicare Other | Source: Ambulatory Visit | Attending: General Surgery | Admitting: General Surgery

## 2015-05-05 DIAGNOSIS — I10 Essential (primary) hypertension: Secondary | ICD-10-CM | POA: Diagnosis not present

## 2015-05-05 DIAGNOSIS — Z888 Allergy status to other drugs, medicaments and biological substances status: Secondary | ICD-10-CM | POA: Insufficient documentation

## 2015-05-05 DIAGNOSIS — N6022 Fibroadenosis of left breast: Secondary | ICD-10-CM | POA: Insufficient documentation

## 2015-05-05 DIAGNOSIS — F419 Anxiety disorder, unspecified: Secondary | ICD-10-CM | POA: Diagnosis not present

## 2015-05-05 DIAGNOSIS — M1990 Unspecified osteoarthritis, unspecified site: Secondary | ICD-10-CM | POA: Diagnosis not present

## 2015-05-05 DIAGNOSIS — Z882 Allergy status to sulfonamides status: Secondary | ICD-10-CM | POA: Insufficient documentation

## 2015-05-05 DIAGNOSIS — Z885 Allergy status to narcotic agent status: Secondary | ICD-10-CM | POA: Insufficient documentation

## 2015-05-05 DIAGNOSIS — E7800 Pure hypercholesterolemia, unspecified: Secondary | ICD-10-CM | POA: Insufficient documentation

## 2015-05-05 DIAGNOSIS — Z87442 Personal history of urinary calculi: Secondary | ICD-10-CM | POA: Insufficient documentation

## 2015-05-05 DIAGNOSIS — R011 Cardiac murmur, unspecified: Secondary | ICD-10-CM | POA: Insufficient documentation

## 2015-05-05 DIAGNOSIS — Z91041 Radiographic dye allergy status: Secondary | ICD-10-CM | POA: Insufficient documentation

## 2015-05-05 HISTORY — DX: Low back pain: M54.5

## 2015-05-05 HISTORY — DX: Low back pain, unspecified: M54.50

## 2015-05-05 HISTORY — PX: BREAST LUMPECTOMY WITH RADIOACTIVE SEED LOCALIZATION: SHX6424

## 2015-05-05 HISTORY — DX: Venous insufficiency (chronic) (peripheral): I87.2

## 2015-05-05 HISTORY — DX: Anxiety disorder, unspecified: F41.9

## 2015-05-05 HISTORY — DX: Dizziness and giddiness: R42

## 2015-05-05 SURGERY — BREAST LUMPECTOMY WITH RADIOACTIVE SEED LOCALIZATION
Anesthesia: General | Site: Breast | Laterality: Left

## 2015-05-05 MED ORDER — GLYCOPYRROLATE 0.2 MG/ML IJ SOLN: 0.2000 mg | Freq: Once | INTRAMUSCULAR | Status: DC | PRN

## 2015-05-05 MED ORDER — PHENYLEPHRINE HCL 10 MG/ML IJ SOLN
INTRAMUSCULAR | Status: DC | PRN
  Administered 2015-05-05: 40 ug via INTRAVENOUS

## 2015-05-05 MED ORDER — FENTANYL CITRATE (PF) 100 MCG/2ML IJ SOLN: 25.0000 ug | INTRAMUSCULAR | Status: DC | PRN

## 2015-05-05 MED ORDER — BUPIVACAINE HCL (PF) 0.25 % IJ SOLN
INTRAMUSCULAR | Status: DC | PRN
  Administered 2015-05-05: 20 mL

## 2015-05-05 MED ORDER — PROPOFOL 10 MG/ML IV BOLUS
INTRAVENOUS | Status: AC
  Filled 2015-05-05: qty 40

## 2015-05-05 MED ORDER — EPHEDRINE SULFATE 50 MG/ML IJ SOLN
INTRAMUSCULAR | Status: AC
  Filled 2015-05-05: qty 1

## 2015-05-05 MED ORDER — MIDAZOLAM HCL 2 MG/2ML IJ SOLN: 1.0000 mg | INTRAMUSCULAR | Status: DC | PRN

## 2015-05-05 MED ORDER — CHLORHEXIDINE GLUCONATE 4 % EX LIQD: 1.0000 "application " | Freq: Once | CUTANEOUS | Status: DC

## 2015-05-05 MED ORDER — LIDOCAINE HCL (CARDIAC) 20 MG/ML IV SOLN
INTRAVENOUS | Status: AC
  Filled 2015-05-05: qty 5

## 2015-05-05 MED ORDER — SCOPOLAMINE 1 MG/3DAYS TD PT72: 1.0000 | MEDICATED_PATCH | Freq: Once | TRANSDERMAL | Status: DC | PRN

## 2015-05-05 MED ORDER — LACTATED RINGERS IV SOLN
INTRAVENOUS | Status: DC
  Administered 2015-05-05 (×2): via INTRAVENOUS

## 2015-05-05 MED ORDER — GLYCOPYRROLATE 0.2 MG/ML IJ SOLN
INTRAMUSCULAR | Status: DC | PRN
  Administered 2015-05-05 (×2): 0.2 mg via INTRAVENOUS

## 2015-05-05 MED ORDER — ONDANSETRON HCL 4 MG/2ML IJ SOLN
INTRAMUSCULAR | Status: DC | PRN
  Administered 2015-05-05: 4 mg via INTRAVENOUS

## 2015-05-05 MED ORDER — DEXAMETHASONE SODIUM PHOSPHATE 10 MG/ML IJ SOLN
INTRAMUSCULAR | Status: AC
  Filled 2015-05-05: qty 1

## 2015-05-05 MED ORDER — ONDANSETRON HCL 4 MG/2ML IJ SOLN
INTRAMUSCULAR | Status: AC
  Filled 2015-05-05: qty 2

## 2015-05-05 MED ORDER — FENTANYL CITRATE (PF) 100 MCG/2ML IJ SOLN
INTRAMUSCULAR | Status: AC
  Filled 2015-05-05: qty 2

## 2015-05-05 MED ORDER — CEFAZOLIN SODIUM-DEXTROSE 2-3 GM-% IV SOLR
INTRAVENOUS | Status: AC
  Filled 2015-05-05: qty 50

## 2015-05-05 MED ORDER — CEFAZOLIN SODIUM-DEXTROSE 2-3 GM-% IV SOLR
2.0000 g | INTRAVENOUS | Status: AC
  Administered 2015-05-05: 2 g via INTRAVENOUS

## 2015-05-05 MED ORDER — FENTANYL CITRATE (PF) 100 MCG/2ML IJ SOLN: 50.0000 ug | INTRAMUSCULAR | Status: DC | PRN

## 2015-05-05 MED ORDER — DEXAMETHASONE SODIUM PHOSPHATE 4 MG/ML IJ SOLN
INTRAMUSCULAR | Status: DC | PRN
  Administered 2015-05-05: 10 mg via INTRAVENOUS

## 2015-05-05 MED ORDER — FENTANYL CITRATE (PF) 100 MCG/2ML IJ SOLN
INTRAMUSCULAR | Status: DC | PRN
  Administered 2015-05-05: 100 ug via INTRAVENOUS

## 2015-05-05 MED ORDER — GLYCOPYRROLATE 0.2 MG/ML IJ SOLN
INTRAMUSCULAR | Status: AC
  Filled 2015-05-05: qty 1

## 2015-05-05 MED ORDER — EPHEDRINE SULFATE 50 MG/ML IJ SOLN
INTRAMUSCULAR | Status: DC | PRN
  Administered 2015-05-05: 10 mg via INTRAVENOUS

## 2015-05-05 MED ORDER — LIDOCAINE HCL (CARDIAC) 20 MG/ML IV SOLN
INTRAVENOUS | Status: DC | PRN
  Administered 2015-05-05: 50 mg via INTRAVENOUS

## 2015-05-05 MED ORDER — OXYCODONE-ACETAMINOPHEN 5-325 MG PO TABS: 1.0000 | ORAL_TABLET | ORAL | Status: DC | PRN

## 2015-05-05 MED ORDER — ONDANSETRON HCL 4 MG/2ML IJ SOLN: 4.0000 mg | Freq: Once | INTRAMUSCULAR | Status: DC | PRN

## 2015-05-05 MED ORDER — PROPOFOL 10 MG/ML IV BOLUS
INTRAVENOUS | Status: DC | PRN
  Administered 2015-05-05: 140 mg via INTRAVENOUS

## 2015-05-05 SURGICAL SUPPLY — 45 items
APPLIER CLIP 9.375 MED OPEN (MISCELLANEOUS)
APR CLP MED 9.3 20 MLT OPN (MISCELLANEOUS)
BLADE SURG 15 STRL LF DISP TIS (BLADE) ×1 IMPLANT
BLADE SURG 15 STRL SS (BLADE) ×2
CANISTER SUC SOCK COL 7IN (MISCELLANEOUS) ×2 IMPLANT
CANISTER SUCT 1200ML W/VALVE (MISCELLANEOUS) ×2 IMPLANT
CHLORAPREP W/TINT 26ML (MISCELLANEOUS) ×2 IMPLANT
CLIP APPLIE 9.375 MED OPEN (MISCELLANEOUS) IMPLANT
COVER BACK TABLE 60X90IN (DRAPES) ×2 IMPLANT
COVER MAYO STAND STRL (DRAPES) ×2 IMPLANT
COVER PROBE W GEL 5X96 (DRAPES) ×2 IMPLANT
DECANTER SPIKE VIAL GLASS SM (MISCELLANEOUS) IMPLANT
DEVICE DUBIN W/COMP PLATE 8390 (MISCELLANEOUS) ×2 IMPLANT
DRAPE LAPAROSCOPIC ABDOMINAL (DRAPES) ×1 IMPLANT
DRAPE UTILITY XL STRL (DRAPES) ×2 IMPLANT
ELECT COATED BLADE 2.86 ST (ELECTRODE) ×2 IMPLANT
ELECT REM PT RETURN 9FT ADLT (ELECTROSURGICAL) ×2
ELECTRODE REM PT RTRN 9FT ADLT (ELECTROSURGICAL) ×1 IMPLANT
GLOVE BIO SURGEON STRL SZ 6.5 (GLOVE) ×2 IMPLANT
GLOVE BIO SURGEON STRL SZ7.5 (GLOVE) ×4 IMPLANT
GLOVE BIOGEL PI IND STRL 7.0 (GLOVE) IMPLANT
GLOVE BIOGEL PI INDICATOR 7.0 (GLOVE) ×1
GLOVE ECLIPSE 6.5 STRL STRAW (GLOVE) ×1 IMPLANT
GLOVE ECLIPSE 7.5 STRL STRAW (GLOVE) ×1 IMPLANT
GLOVE SS BIOGEL STRL SZ 7.5 (GLOVE) IMPLANT
GLOVE SUPERSENSE BIOGEL SZ 7.5 (GLOVE) ×1
GOWN STRL REUS W/ TWL LRG LVL3 (GOWN DISPOSABLE) ×2 IMPLANT
GOWN STRL REUS W/TWL LRG LVL3 (GOWN DISPOSABLE) ×4
KIT MARKER MARGIN INK (KITS) ×2 IMPLANT
LIQUID BAND (GAUZE/BANDAGES/DRESSINGS) ×2 IMPLANT
NDL HYPO 25X1 1.5 SAFETY (NEEDLE) IMPLANT
NEEDLE HYPO 25X1 1.5 SAFETY (NEEDLE) ×2 IMPLANT
NS IRRIG 1000ML POUR BTL (IV SOLUTION) ×1 IMPLANT
PACK BASIN DAY SURGERY FS (CUSTOM PROCEDURE TRAY) ×2 IMPLANT
PENCIL BUTTON HOLSTER BLD 10FT (ELECTRODE) ×2 IMPLANT
SLEEVE SCD COMPRESS KNEE MED (MISCELLANEOUS) ×2 IMPLANT
SPONGE LAP 18X18 X RAY DECT (DISPOSABLE) ×2 IMPLANT
SUT MON AB 4-0 PC3 18 (SUTURE) ×1 IMPLANT
SUT SILK 2 0 SH (SUTURE) IMPLANT
SUT VICRYL 3-0 CR8 SH (SUTURE) ×2 IMPLANT
SYR CONTROL 10ML LL (SYRINGE) ×1 IMPLANT
TOWEL OR 17X24 6PK STRL BLUE (TOWEL DISPOSABLE) ×2 IMPLANT
TOWEL OR NON WOVEN STRL DISP B (DISPOSABLE) ×2 IMPLANT
TUBE CONNECTING 20X1/4 (TUBING) ×2 IMPLANT
YANKAUER SUCT BULB TIP NO VENT (SUCTIONS) ×1 IMPLANT

## 2015-05-05 NOTE — Anesthesia Preprocedure Evaluation (Addendum)
Anesthesia Evaluation  Patient identified by MRN, date of birth, ID band Patient awake    Reviewed: Allergy & Precautions, H&P , NPO status , Patient's Chart, lab work & pertinent test results, reviewed documented beta blocker date and time   History of Anesthesia Complications Negative for: history of anesthetic complications  Airway Mallampati: II  TM Distance: >3 FB Neck ROM: full    Dental no notable dental hx.    Pulmonary neg pulmonary ROS,    Pulmonary exam normal breath sounds clear to auscultation       Cardiovascular hypertension, Pt. on medications Normal cardiovascular exam+ dysrhythmias (1st degree AV block, PACs intermittent)  Rhythm:regular Rate:Normal  Echo 2015 with normal EF, diastolic dysfunction that is mild, mild MR   Neuro/Psych Anxiety negative neurological ROS     GI/Hepatic negative GI ROS, Neg liver ROS,   Endo/Other  negative endocrine ROS  Renal/GU negative Renal ROS     Musculoskeletal   Abdominal   Peds  Hematology negative hematology ROS (+)   Anesthesia Other Findings   Reproductive/Obstetrics negative OB ROS                            Anesthesia Physical Anesthesia Plan  ASA: II  Anesthesia Plan: General   Post-op Pain Management:    Induction: Intravenous  Airway Management Planned: LMA  Additional Equipment:   Intra-op Plan:   Post-operative Plan:   Informed Consent: I have reviewed the patients History and Physical, chart, labs and discussed the procedure including the risks, benefits and alternatives for the proposed anesthesia with the patient or authorized representative who has indicated his/her understanding and acceptance.   Dental Advisory Given  Plan Discussed with: Anesthesiologist, CRNA and Surgeon  Anesthesia Plan Comments:         Anesthesia Quick Evaluation

## 2015-05-05 NOTE — Anesthesia Postprocedure Evaluation (Signed)
  Anesthesia Post-op Note  Patient: Robin [name]  Procedure(s) Performed: Procedure(s) (LRB): LEFT BREAST LUMPECTOMY WITH RADIOACTIVE SEED LOCALIZATION (Left)  Patient Location: PACU  Anesthesia Type: General  Level of Consciousness: awake and alert   Airway and Oxygen Therapy: Patient Spontanous Breathing  Post-op Pain: mild  Post-op Assessment: Post-op Vital signs reviewed, Patient's Cardiovascular Status Stable, Respiratory Function Stable, Patent Airway and No signs of Nausea or vomiting  Last Vitals:  Filed Vitals:   05/05/15 1245  BP: 162/76  Pulse: 93  Temp:   Resp: 16    Post-op Vital Signs: stable   Complications: No apparent anesthesia complications

## 2015-05-05 NOTE — Interval H&P Note (Signed)
History and Physical Interval Note:  05/05/2015 11:20 AM  Mansi E. Sink  has presented today for surgery, with the diagnosis of LEFT BREAST COMPLEX SCLEROSING LESION  The various methods of treatment have been discussed with the patient and family. After consideration of risks, benefits and other options for treatment, the patient has consented to  Procedure(s): LEFT BREAST LUMPECTOMY WITH RADIOACTIVE SEED LOCALIZATION (Left) as a surgical intervention .  The patient's history has been reviewed, patient examined, no change in status, stable for surgery.  I have reviewed the patient's chart and labs.  Questions were answered to the patient's satisfaction.     TOTH III,PAUL S

## 2015-05-05 NOTE — Anesthesia Procedure Notes (Signed)
Procedure Name: LMA Insertion Date/Time: 05/05/2015 11:46 AM Performed by: Toula Moos L Pre-anesthesia Checklist: Patient identified, Emergency Drugs available, Suction available, Patient being monitored and Timeout performed Patient Re-evaluated:Patient Re-evaluated prior to inductionOxygen Delivery Method: Circle System Utilized Preoxygenation: Pre-oxygenation with 100% oxygen Intubation Type: IV induction Ventilation: Mask ventilation without difficulty LMA: LMA inserted LMA Size: 4.0 Number of attempts: 1 Airway Equipment and Method: Bite block Placement Confirmation: positive ETCO2 Tube secured with: Tape Dental Injury: Teeth and Oropharynx as per pre-operative assessment

## 2015-05-05 NOTE — Op Note (Signed)
05/05/2015  12:28 PM  PATIENT:  Robin [name]  80 y.o. female  PRE-OPERATIVE DIAGNOSIS:  LEFT BREAST COMPLEX SCLEROSING LESION  POST-OPERATIVE DIAGNOSIS:  LEFT BREAST COMPLEX SCLEROSING LESION  PROCEDURE:  Procedure(s): LEFT BREAST LUMPECTOMY WITH RADIOACTIVE SEED LOCALIZATION (Left)  SURGEON:  Surgeon(s) and Role:    * Jovita Kussmaul, MD - Primary  PHYSICIAN ASSISTANT:   ASSISTANTS: none   ANESTHESIA:   general  EBL:  Total I/O In: 1000 [I.V.:1000] Out: -   BLOOD ADMINISTERED:none  DRAINS: none   LOCAL MEDICATIONS USED:  MARCAINE     SPECIMEN:  Source of Specimen:  left breast tissue  DISPOSITION OF SPECIMEN:  PATHOLOGY  COUNTS:  YES  TOURNIQUET:  * No tourniquets in log *  DICTATION: .Dragon Dictation   After informed consent was obtained the patient was brought to the operating room and placed in the supine position on the operating room table. After adequate induction of general anesthesia the patient's left breast was prepped with ChloraPrep, allowed to dry, and draped in usual sterile manner. Previously an I-125 seed was placed in the lateral aspect of the left breast to mark an area of a complex sclerosing lesion. The neoprobe was set to I-125 in the area of radioactivity in the lateral left breast was readily identified. A transversely oriented incision was made overlying the area of radioactivity with a 15 blade knife. The incision was carried to the skin and subcutaneous tissue sharply with the electrocautery. While checking the area of radioactivity frequently with the neoprobe a circular portion of breast tissue was excised sharply around the radioactive seed with the electrocautery. Once the specimen was removed it was oriented with the appropriate paint colors. A specimen radiograph was obtained that showed the clip and seed to be in the center of the specimen. The specimen was then sent to pathology for further evaluation. Wound was infiltrated with quarter  percent Marcaine and irrigated with saline. The deep layer of the wound was closed in layers of interrupted 3-0 Vicryl stitches. The skin was then closed with interrupted 4-0 Monocryl subcuticular stitches. Dermabond dressings were applied. The patient tolerated the procedure well. At the end of the case all needle sponge and instrument counts were correct. The patient was then awakened and taken to recovery in stable condition.  PLAN OF CARE: Discharge to home after PACU  PATIENT DISPOSITION:  PACU - hemodynamically stable.   Delay start of Pharmacological VTE agent (>24hrs) due to surgical blood loss or risk of bleeding: not applicable

## 2015-05-05 NOTE — Transfer of Care (Signed)
Immediate Anesthesia Transfer of Care Note  Patient: Robin [name]  Procedure(s) Performed: Procedure(s): LEFT BREAST LUMPECTOMY WITH RADIOACTIVE SEED LOCALIZATION (Left)  Patient Location: PACU  Anesthesia Type:General  Level of Consciousness: awake and patient cooperative  Airway & Oxygen Therapy: Patient Spontanous Breathing and Patient connected to face mask oxygen  Post-op Assessment: Report given to RN and Post -op Vital signs reviewed and stable  Post vital signs: Reviewed and stable  Last Vitals:  Filed Vitals:   05/05/15 1014  BP: 182/60  Pulse: 63  Temp: 36.4 C  Resp: 20    Complications: No apparent anesthesia complications

## 2015-05-05 NOTE — Discharge Instructions (Signed)

## 2015-05-05 NOTE — H&P (Signed)
Robin [name] 03/29/2015 9:55 AM Location: [facility] Patient #: [id number] DOB: 02/18/1935 Widowed / Language: Cleophus Molt / Race: White Female   History of Present Illness Eddie Dibbles S. Marlou Starks MD; 03/30/2015 8:35 AM) Patient words: breast f/u.  The patient is a 80 year old female who presents with a breast mass. We are asked to see the patient in consultation by Dr. Pamelia Hoit to evaluate her for a complex sclerosing lesion of the left breast. The patient is a 80 year old white female who recently went for a routine screening mammogram. At that time she was found to have an abnormality in the lateral left breast. This was biopsied and came back as a complex sclerosing lesion. She denies any breast pain or discharge from the nipple. She states that she has no personal or family history of breast cancer and does not smoke.   Other Problems Marjean Donna, CMA; 03/29/2015 9:55 AM) Anxiety Disorder Arthritis Chest pain Cholelithiasis Heart murmur High blood pressure Hypercholesterolemia Kidney Stone Oophorectomy Bilateral. Other disease, cancer, significant illness Transfusion history Vascular Disease  Past Surgical History Marjean Donna, CMA; 03/29/2015 9:55 AM) Breast Biopsy Bilateral. Breast Mass; Local Excision Right. Cataract Surgery Bilateral. Gallbladder Surgery - Open Hysterectomy (not due to cancer) - Complete Lung Surgery Right. Tonsillectomy  Diagnostic Studies History Marjean Donna, CMA; 03/29/2015 9:55 AM) Colonoscopy 5-10 years ago Mammogram within last year Pap Smear 1-5 years ago  Allergies Marjean Donna, CMA; 03/29/2015 10:01 AM) AmLODIPine Besylate *CALCIUM CHANNEL BLOCKERS* Codeine Phosphate *ANALGESICS - OPIOID* Pravastatin Sodium *ANTIHYPERLIPIDEMICS* Sulfa Antibiotics DiphenhydrAMINE HCl *ANTIHISTAMINES*  Medication History (Sonya Bynum, CMA; 03/29/2015 9:58 AM) Xanax (0.5MG  Tablet, Oral) Active. Vitamin D  (Cholecalciferol) (1000UNIT Capsule, Oral) Active. Vitamin A (10000UNIT Capsule, Oral) Active. Tiazac (120MG  Capsule ER 24HR, Oral) Active. Metoprolol Succinate (100MG  Tablet ER, Oral) Active. Estrace (1MG  Tablet, Oral) Active. Potassium Chloride (20MEQ Packet, Oral) Active. Micardis (80MG  Tablet, Oral) Active. Vitamin E (1000UNIT Capsule, Oral) Active.  Social History Marjean Donna, [place]; 03/29/2015 9:55 AM) Alcohol use Occasional alcohol use. Caffeine use Carbonated beverages, Coffee, Tea. No drug use Tobacco use Former smoker.  Family History Marjean Donna, [place]; 03/29/2015 9:55 AM) Alcohol Abuse Father. Arthritis Family Members In General, Mother, Sister. Cancer Sister. Heart Disease Brother, Father, Mother. Hypertension Brother, Mother, Sister. Migraine Headache Family Members In General, Sister. Respiratory Condition Sister.  Pregnancy / Birth History Marjean Donna, [place]; 03/29/2015 9:55 AM) Age at menarche 27 years. Age of menopause <47 Gravida 4 Maternal age 53-25 Para 2    Review of Systems Eddie Dibbles S. Marlou Starks MD; 03/30/2015 8:35 AM) General Not Present- Appetite Loss, Chills, Fatigue, Fever, Night Sweats, Weight Gain and Weight Loss. Skin Not Present- Change in Wart/Mole, Dryness, Hives, Jaundice, New Lesions, Non-Healing Wounds, Rash and Ulcer. HEENT Not Present- Earache, Hearing Loss, Hoarseness, Nose Bleed, Oral Ulcers, Ringing in the Ears, Seasonal Allergies, Sinus Pain, Sore Throat, Visual Disturbances, Wears glasses/contact lenses and Yellow Eyes. Respiratory Not Present- Bloody sputum, Chronic Cough, Difficulty Breathing, Snoring and Wheezing. Breast Not Present- Breast Mass, Breast Pain, Nipple Discharge and Skin Changes. Cardiovascular Not Present- Chest Pain, Difficulty Breathing Lying Down, Leg Cramps, Palpitations, Rapid Heart Rate, Shortness of Breath and Swelling of Extremities. Gastrointestinal Not Present- Abdominal Pain, Bloating, Bloody  Stool, Change in Bowel Habits, Chronic diarrhea, Constipation, Difficulty Swallowing, Excessive gas, Gets full quickly at meals, Hemorrhoids, Indigestion, Nausea, Rectal Pain and Vomiting. Female Genitourinary Not Present- Frequency, Nocturia, Painful Urination, Pelvic Pain and Urgency. Musculoskeletal Not Present- Back Pain, Joint Pain, Joint Stiffness, Muscle Pain,  Muscle Weakness and Swelling of Extremities. Neurological Not Present- Decreased Memory, Fainting, Headaches, Numbness, Seizures, Tingling, Tremor, Trouble walking and Weakness. Psychiatric Not Present- Anxiety, Bipolar, Change in Sleep Pattern, Depression, Fearful and Frequent crying. Endocrine Not Present- Cold Intolerance, Excessive Hunger, Hair Changes, Heat Intolerance, Hot flashes and New Diabetes. Hematology Not Present- Easy Bruising, Excessive bleeding, Gland problems, HIV and Persistent Infections.  Vitals (Sonya Bynum CMA; 03/29/2015 9:56 AM) 03/29/2015 9:56 AM Weight: 149 lb Height: 65in Body Surface Area: 1.76 m Body Mass Index: 24.79 kg/m  Temp.: 30F(Temporal)  Pulse: 81 (Regular)  BP: 124/76 (Sitting, Left Arm, Standard)     Physical Exam Eddie Dibbles S. Marlou Starks MD; 03/30/2015 8:36 AM) General Mental Status-Alert. General Appearance-Consistent with stated age. Hydration-Well hydrated. Voice-Normal.  Head and Neck Head-normocephalic, atraumatic with no lesions or palpable masses. Trachea-midline. Thyroid Gland Characteristics - normal size and consistency.  Eye Eyeball - Bilateral-Extraocular movements intact. Sclera/Conjunctiva - Bilateral-No scleral icterus.  Chest and Lung Exam Chest and lung exam reveals -quiet, even and easy respiratory effort with no use of accessory muscles and on auscultation, normal breath sounds, no adventitious sounds and normal vocal resonance. Inspection Chest Wall - Normal. Back - normal.  Breast Note: There is no palpable mass in either  breast. There is no palpable axillary, supraclavicular, or cervical lymphadenopathy.   Cardiovascular Cardiovascular examination reveals -normal heart sounds, regular rate and rhythm with no murmurs and normal pedal pulses bilaterally.  Abdomen Inspection Inspection of the abdomen reveals - No Hernias. Skin - Scar - no surgical scars. Palpation/Percussion Palpation and Percussion of the abdomen reveal - Soft, Non Tender, No Rebound tenderness, No Rigidity (guarding) and No hepatosplenomegaly. Auscultation Auscultation of the abdomen reveals - Bowel sounds normal.  Neurologic Neurologic evaluation reveals -alert and oriented x 3 with no impairment of recent or remote memory. Mental Status-Normal.  Musculoskeletal Normal Exam - Left-Upper Extremity Strength Normal and Lower Extremity Strength Normal. Normal Exam - Right-Upper Extremity Strength Normal and Lower Extremity Strength Normal.  Lymphatic Head & Neck  General Head & Neck Lymphatics: Bilateral - Description - Normal. Axillary  General Axillary Region: Bilateral - Description - Normal. Tenderness - Non Tender. Femoral & Inguinal  Generalized Femoral & Inguinal Lymphatics: Bilateral - Description - Normal. Tenderness - Non Tender.    Assessment & Plan Eddie Dibbles S. Marlou Starks MD; 03/29/2015 10:32 AM) SCLEROSING ADENOSIS OF BREAST, LEFT (N60.22) Impression: The patient appears to have a complex sclerosing lesion in the lateral left breast. Because it is considered a high risk lesion and because of its abnormal appearance I would recommend that this area be removed. I have discussed with her in detail the risks and benefits of the operation to do this as well as some of the technical aspects and she understands and wishes to proceed. She would need a left breast radioactive seed localized lumpectomy. She would like to schedule this around some vein work that she is having done. Current Plans Pt Education - Breast Diseases:  discussed with patient and provided information.   Signed by Luella Cook, MD (03/30/2015 8:40 AM)

## 2015-05-06 ENCOUNTER — Encounter (HOSPITAL_BASED_OUTPATIENT_CLINIC_OR_DEPARTMENT_OTHER): Payer: Self-pay | Admitting: General Surgery

## 2015-05-16 ENCOUNTER — Ambulatory Visit: Payer: Medicare Other | Admitting: Cardiology

## 2015-06-16 ENCOUNTER — Other Ambulatory Visit: Payer: Self-pay | Admitting: Cardiology

## 2015-07-13 ENCOUNTER — Encounter: Payer: Self-pay | Admitting: Cardiology

## 2015-07-13 ENCOUNTER — Ambulatory Visit (INDEPENDENT_AMBULATORY_CARE_PROVIDER_SITE_OTHER): Payer: Medicare Other | Admitting: Cardiology

## 2015-07-13 VITALS — BP 118/70 | HR 62 | Ht 65.0 in | Wt 148.0 lb

## 2015-07-13 DIAGNOSIS — I119 Hypertensive heart disease without heart failure: Secondary | ICD-10-CM

## 2015-07-13 DIAGNOSIS — R002 Palpitations: Secondary | ICD-10-CM | POA: Diagnosis not present

## 2015-07-13 DIAGNOSIS — I440 Atrioventricular block, first degree: Secondary | ICD-10-CM

## 2015-07-13 DIAGNOSIS — I491 Atrial premature depolarization: Secondary | ICD-10-CM | POA: Diagnosis not present

## 2015-07-13 NOTE — Progress Notes (Signed)
Cardiology Office Note   Date:  07/13/2015   ID:  Robin [name] [birth year], MRN [id number]  PCP:  [name], PA-C  Cardiologist: Darlin Coco MD  Chief Complaint  Patient presents with  . routine follow up    Patient denies any chest pain, shortness of breath, le edema. or claudication      History of Present Illness: Robin [name] is a 81 y.o. female who presents for  Six-month follow-up visit  This pleasant 81 year old woman with a history of palpitations, high blood pressure and hypercholesterolemia is seen for a six-month followup office visit. She has a long history of high blood pressure. We initially saw her in July 2011. She does not have any history of ischemic heart disease. She had a previous normal nuclear stress test in August 2009 at [facility]. She also had a normal echocardiogram and normal cardiac catheterization. Since last visit she's been doing well. She has had some palpitations. She states that she has white coat syndrome when she comes to the doctor's office. She states that years ago she was told she had mitral valve prolapse. Since last visit she had an echocardiogram on 05/09/12 which showed an ejection fraction of 55-60% and showed mild mitral regurgitation.  Her last echocardiogram on 11/04/13 showed normal LV systolic function with an ejection fraction of 60-65% and there was grade 1 diastolic dysfunction.  She has a past history of hypokalemia and has been on potassium supplementation.  Her PCP checked her potassium recently and it was normal Overall she's been feeling better. She is trying to walk a mile a day. She had been having prior palpitations at night which stopped when she stopped eating a bedtime snack. She gives a history of having had vertigo in February and saw an ENT physician who diagnosed fluid in her right ear and placed her on a nasal spray. She has a history of venous insufficiency  with reflux and is being treated at the vein center by Dr. Aleda Grana. She thinks that she has finished with surgery on her legs now. She is pleased with the result.  Since we last saw her she had a left breast biopsy which proved to be non-cancer.   Past Medical History  Diagnosis Date  . Hypertension   . Hyperlipidemia   . AV bloc first degree   . Anxiety   . Vertigo   . Venous insufficiency of both lower extremities   . Lumbar pain     fell off of her horse and has low back pain at times    Past Surgical History  Procedure Laterality Date  . Cholecystectomy    . Lung surgery    . Abdominal hysterectomy    . Eye surgery      bil cataract surgery  . Breast lumpectomy with radioactive seed localization Left 05/05/2015    Procedure: LEFT BREAST LUMPECTOMY WITH RADIOACTIVE SEED LOCALIZATION;  Surgeon: Autumn Messing III, MD;  Location: [facility];  Service: General;  Laterality: Left;     Current Outpatient Prescriptions  Medication Sig Dispense Refill  . ALPRAZolam (XANAX) 0.5 MG tablet Take 0.5 mg by mouth daily as needed (for anxiety).     Marland Kitchen BILBERRY-BIOFLAV-RUTIN-QUERCET PO Take 1,000 mg by mouth.    Marland Kitchen CALCIUM PO Take 1 tablet by mouth daily.     . Cholecalciferol (VITAMIN D PO) Take 2,400 mg by mouth daily.      Marland Kitchen diltiazem (TIAZAC) 120  MG 24 hr capsule Take 120 mg by mouth daily.    . diphenoxylate-atropine (LOMOTIL) 2.5-0.025 MG per tablet Take 1 tablet by mouth daily as needed (for diarrhea).     . estradiol (ESTRACE) 1 MG tablet Take 1 mg by mouth daily.    . Lutein 20 MG CAPS Take 1 capsule by mouth daily.     . metoprolol (TOPROL-XL) 100 MG 24 hr tablet Take by mouth daily. 1/2 tab in am and 1/2 tab  in pm    . mometasone (NASONEX) 50 MCG/ACT nasal spray Place 2 sprays into the nose daily.    . potassium chloride SA (K-DUR,KLOR-CON) 20 MEQ tablet Take 1 tablet (20 mEq total) by mouth daily. 90 tablet 1  . telmisartan (MICARDIS) 80 MG tablet Take 80 mg by  mouth daily.    Marland Kitchen triamcinolone cream (KENALOG) 0.1 % Apply 1 application topically daily as needed (for rash).     . Triamterene-HCTZ (DYAZIDE PO) Take 25 mg by mouth daily.      Marland Kitchen VITAMIN A PO Take 5,000 mg by mouth daily.      . vitamin E (VITAMIN E) 400 UNIT capsule Take 400 Units by mouth 3 (three) times a week.      No current facility-administered medications for this visit.    Allergies:   Amlodipine; Codeine; Diphenhydramine; Iodinated diagnostic agents; Pravastatin; Sulfa antibiotics; and Sulfa drugs cross reactors    Social History:  The patient  reports that she has never smoked. She does not have any smokeless tobacco history on file. She reports that she drinks alcohol. She reports that she does not use illicit drugs.   Family History:  The patient's family history includes Cancer in her sister; Heart disease in her father and mother; Hypertension in her brother, mother, sister, sister, and sister; Stroke in her mother.    ROS:  Please see the history of present illness.   Otherwise, review of systems are positive for none.   All other systems are reviewed and negative.    PHYSICAL EXAM: VS:  BP 118/70 mmHg  Pulse 62  Ht 5\' 5"  (1.651 m)  Wt 148 lb (67.132 kg)  BMI 24.63 kg/m2 , BMI Body mass index is 24.63 kg/(m^2). GEN: Well nourished, well developed, in no acute distress HEENT: normal Neck: no JVD, carotid bruits, or masses Cardiac: RRR; no murmurs, rubs, or gallops,no edema  Respiratory:  clear to auscultation bilaterally, normal work of breathing GI: soft, nontender, nondistended, + BS MS: no deformity or atrophy Skin: warm and dry, no rash Neuro:  Strength and sensation are intact Psych: euthymic mood, full affect   EKG:  EKG is ordered today. The ekg ordered today demonstrates  Normal sinus rhythm with first-degree AV block and occasional PAC. Since prior tracing of 06/03/14, no significant change   Recent Labs: 05/02/2015: BUN 12; Creatinine, Ser 0.75;  Potassium 4.3; Sodium 137    Lipid Panel    Component Value Date/Time   CHOL 213* 06/14/2011 0832   TRIG 85.0 06/14/2011 0832   HDL 32.60* 06/14/2011 0832   CHOLHDL 7 06/14/2011 0832   VLDL 17.0 06/14/2011 0832   LDLDIRECT 161.5 06/14/2011 0832      Wt Readings from Last 3 Encounters:  07/13/15 148 lb (67.132 kg)  05/05/15 147 lb (66.679 kg)  11/09/14 154 lb 12.8 oz (70.217 kg)        ASSESSMENT AND PLAN:  1. hypertensive heart disease without heart failure. 2. palpitations and history of PACs. 3.  Hypercholesterolemia 4. Hypokalemia resolved 5. First-degree AV block, stable   Current medicines are reviewed at length with the patient today.  The patient does not have concerns regarding medicines.  The following changes have been made:  no change  Labs/ tests ordered today include:   Orders Placed This Encounter  Procedures  . EKG 12-Lead     disposition: the patient is currently stable on her present regimen.  She will continue current medication. She will continue close follow-up with her PCP and return here on a when necessary basis.   Berna Spare MD 07/13/2015 12:59 PM    [facility], [place], [place]  [address] Phone: [phone]; Fax: [phone]

## 2015-07-13 NOTE — Patient Instructions (Signed)
Medication Instructions:  Your physician recommends that you continue on your current medications as directed. Please refer to the Current Medication list given to you today.   Labwork: none  Testing/Procedures: none  Follow-Up: Follow up with Cardiology as needed.   Any Other Special Instructions Will Be Listed Below (If Applicable).     If you need a refill on your cardiac medications before your next appointment, please call your pharmacy.

## 2015-08-05 ENCOUNTER — Telehealth: Payer: Self-pay | Admitting: *Deleted

## 2015-08-05 ENCOUNTER — Telehealth: Payer: Self-pay | Admitting: Cardiology

## 2015-08-05 ENCOUNTER — Ambulatory Visit (INDEPENDENT_AMBULATORY_CARE_PROVIDER_SITE_OTHER): Payer: Medicare Other | Admitting: Cardiology

## 2015-08-05 VITALS — BP 120/80 | HR 76 | Ht 64.0 in | Wt 146.4 lb

## 2015-08-05 DIAGNOSIS — E876 Hypokalemia: Secondary | ICD-10-CM

## 2015-08-05 DIAGNOSIS — I480 Paroxysmal atrial fibrillation: Secondary | ICD-10-CM | POA: Insufficient documentation

## 2015-08-05 DIAGNOSIS — R002 Palpitations: Secondary | ICD-10-CM

## 2015-08-05 DIAGNOSIS — I119 Hypertensive heart disease without heart failure: Secondary | ICD-10-CM | POA: Diagnosis not present

## 2015-08-05 LAB — CBC WITH DIFFERENTIAL/PLATELET
Basophils Absolute: 0 10*3/uL (ref 0.0–0.1)
Basophils Relative: 0 % (ref 0–1)
Eosinophils Absolute: 0.2 10*3/uL (ref 0.0–0.7)
Eosinophils Relative: 2 % (ref 0–5)
HCT: 43.8 % (ref 36.0–46.0)
Hemoglobin: 14.8 g/dL (ref 12.0–15.0)
Lymphocytes Relative: 22 % (ref 12–46)
Lymphs Abs: 2 10*3/uL (ref 0.7–4.0)
MCH: 30.4 pg (ref 26.0–34.0)
MCHC: 33.8 g/dL (ref 30.0–36.0)
MCV: 89.9 fL (ref 78.0–100.0)
MPV: 9.2 fL (ref 8.6–12.4)
Monocytes Absolute: 0.7 10*3/uL (ref 0.1–1.0)
Monocytes Relative: 8 % (ref 3–12)
Neutro Abs: 6.2 10*3/uL (ref 1.7–7.7)
Neutrophils Relative %: 68 % (ref 43–77)
Platelets: 292 10*3/uL (ref 150–400)
RBC: 4.87 MIL/uL (ref 3.87–5.11)
RDW: 13 % (ref 11.5–15.5)
WBC: 9.1 10*3/uL (ref 4.0–10.5)

## 2015-08-05 LAB — BASIC METABOLIC PANEL
BUN: 15 mg/dL (ref 7–25)
CO2: 28 mmol/L (ref 20–31)
Calcium: 10.2 mg/dL (ref 8.6–10.4)
Chloride: 102 mmol/L (ref 98–110)
Creat: 0.76 mg/dL (ref 0.60–0.93)
Glucose, Bld: 102 mg/dL — ABNORMAL HIGH (ref 65–99)
Potassium: 3.5 mmol/L (ref 3.5–5.3)
Sodium: 139 mmol/L (ref 135–146)

## 2015-08-05 MED ORDER — APIXABAN 5 MG PO TABS: 5.0000 mg | ORAL_TABLET | Freq: Two times a day (BID) | ORAL | Status: DC

## 2015-08-05 NOTE — Telephone Encounter (Signed)
Patient c/o Palpitations:  High priority if patient c/o lightheadedness and shortness of breath.  1. How long have you been having palpitations? This am   2. Are you currently experiencing lightheadedness and shortness of breath? "not much breath"   3. Have you checked your BP and heart rate? (document readings) 111/68 p 109,  117/80 p 95, 100/65 p 89    4. Are you experiencing any other symptoms? Feeling weak   Stated she took 75 mg of toprol. Please call back and discuss.

## 2015-08-05 NOTE — Progress Notes (Signed)
Cardiology Office Note   Date:  08/05/2015   ID:  Zelia E. Meredeth Ide 1935/06/08, MRN [id number]  PCP:  [name], PA-C  Cardiologist: Darlin Coco MD  Chief Complaint  Patient presents with  . Shortness of Breath  . Nausea  . Chest Pain      History of Present Illness: Robin [name] is a 81 y.o. female who presents for evaluation of palpitations.  EKG today shows that she is in atrial flutter fibrillation.  She gives a history of intermittent nocturnal palpitations which have been increasing.  Today she awoke feeling somewhat nauseated and was aware of rapid irregular heartbeat in her chest.  She checked her blood pressure and it was much lower than usual and her pulse was 109 on her blood pressure machine.  She took a neck to have Toprol which resulted in slowing of her pulse.  However she has continued to feel the palpitations today.  She does not have any prior documentation of atrial flutter fibrillation. . She has a long history of high blood pressure. We initially saw her in July 2011. She does not have any history of ischemic heart disease. She had a previous normal nuclear stress test in August 2009 at [facility]. She also had a normal echocardiogram and normal cardiac catheterization. Since last visit she's been doing well. She has had some palpitations. She states that she has white coat syndrome when she comes to the doctor's office. She states that years ago she was told she had mitral valve prolapse. Since last visit she had an echocardiogram on 05/09/12 which showed an ejection fraction of 55-60% and showed mild mitral regurgitation.  Her last echocardiogram on 11/04/13 showed normal LV systolic function with an ejection fraction of 60-65% and there was grade 1 diastolic dysfunction.  She has a past history of hypokalemia and has been on potassium supplementation. Her PCP checked her potassium recently and it was normal. She  does not have any history of TIA or stroke.   Past Medical History  Diagnosis Date  . Hypertension   . Hyperlipidemia   . AV bloc first degree   . Anxiety   . Vertigo   . Venous insufficiency of both lower extremities   . Lumbar pain     fell off of her horse and has low back pain at times    Past Surgical History  Procedure Laterality Date  . Cholecystectomy    . Lung surgery    . Abdominal hysterectomy    . Eye surgery      bil cataract surgery  . Breast lumpectomy with radioactive seed localization Left 05/05/2015    Procedure: LEFT BREAST LUMPECTOMY WITH RADIOACTIVE SEED LOCALIZATION;  Surgeon: Autumn Messing III, MD;  Location: [place];  Service: General;  Laterality: Left;     Current Outpatient Prescriptions  Medication Sig Dispense Refill  . ALPRAZolam (XANAX) 0.5 MG tablet Take 0.5 mg by mouth daily as needed (for anxiety).     Marland Kitchen BILBERRY-BIOFLAV-RUTIN-QUERCET PO Take 1,000 mg by mouth.    Marland Kitchen CALCIUM PO Take 1 tablet by mouth daily.     . Cholecalciferol (VITAMIN D PO) Take 2,400 mg by mouth daily.      Marland Kitchen diltiazem (TIAZAC) 120 MG 24 hr capsule Take 120 mg by mouth 2 (two) times daily.     . diphenoxylate-atropine (LOMOTIL) 2.5-0.025 MG per tablet Take 1 tablet by mouth daily as needed (for diarrhea).     Marland Kitchen  estradiol (ESTRACE) 1 MG tablet Take 1 mg by mouth daily.    . Lutein 20 MG CAPS Take 1 capsule by mouth daily.     . metoprolol (TOPROL-XL) 100 MG 24 hr tablet Take by mouth daily. 1/2 tab in am and 1/2 tab  in pm    . mometasone (NASONEX) 50 MCG/ACT nasal spray Place 2 sprays into the nose daily.    . potassium chloride SA (K-DUR,KLOR-CON) 20 MEQ tablet Take 1 tablet (20 mEq total) by mouth daily. 90 tablet 1  . telmisartan (MICARDIS) 80 MG tablet Take 80 mg by mouth as directed. 1/2 tablet by mouth daily    . triamcinolone cream (KENALOG) 0.1 % Apply 1 application topically daily as needed (for rash).     . Triamterene-HCTZ (DYAZIDE PO) Take 25 mg  by mouth daily.      Marland Kitchen VITAMIN A PO Take 5,000 mg by mouth daily.      Marland Kitchen apixaban (ELIQUIS) 5 MG TABS tablet Take 1 tablet (5 mg total) by mouth 2 (two) times daily. 60 tablet 5   No current facility-administered medications for this visit.    Allergies:   Iodinated diagnostic agents; Amlodipine; Codeine; Diphenhydramine; Diphenhydramine hcl; Pravastatin; Sulfa antibiotics; and Sulfa drugs cross reactors    Social History:  The patient  reports that she has never smoked. She does not have any smokeless tobacco history on file. She reports that she drinks alcohol. She reports that she does not use illicit drugs.   Family History:  The patient's family history includes Cancer in her sister; Heart disease in her father and mother; Hypertension in her brother, mother, sister, sister, and sister; Stroke in her mother.    ROS:  Please see the history of present illness.   Otherwise, review of systems are positive for none.   All other systems are reviewed and negative.    PHYSICAL EXAM: VS:  BP 120/80 mmHg  Pulse 76  Ht 5\' 4"  (1.626 m)  Wt 146 lb 6.4 oz (66.407 kg)  BMI 25.12 kg/m2 , BMI Body mass index is 25.12 kg/(m^2). GEN: Well nourished, well developed, in no acute distress HEENT: normal Neck: no JVD, carotid bruits, or masses Cardiac: RRR; no murmurs, rubs, or gallops,no edema  Respiratory:  clear to auscultation bilaterally, normal work of breathing GI: soft, nontender, nondistended, + BS MS: no deformity or atrophy Skin: warm and dry, no rash Neuro:  Strength and sensation are intact Psych: euthymic mood, full affect   EKG:  EKG is ordered today. The ekg ordered today demonstrates atrial flutter fibrillation with variable ventricular response.  Ventricular rate of 111.  Since the previous EKG, atrial flutter fibrillation is negative.   Recent Labs: 05/02/2015: BUN 12; Creatinine, Ser 0.75; Potassium 4.3; Sodium 137    Lipid Panel    Component Value Date/Time   CHOL  213* 06/14/2011 0832   TRIG 85.0 06/14/2011 0832   HDL 32.60* 06/14/2011 0832   CHOLHDL 7 06/14/2011 0832   VLDL 17.0 06/14/2011 0832   LDLDIRECT 161.5 06/14/2011 0832      Wt Readings from Last 3 Encounters:  08/05/15 146 lb 6.4 oz (66.407 kg)  07/13/15 148 lb (67.132 kg)  05/05/15 147 lb (66.679 kg)        ASSESSMENT AND PLAN:  1.  Paroxysmal atrial flutter fibrillation.This patients CHA2DS2-VASc Score is 4 for age female sex and hypertension. Above score calculated as 1 point each if present [CHF, HTN, DM, Vascular=MI/PAD/Aortic Plaque, Age if 5-74, or  Female] Above score calculated as 2 points each if present [Age > 75, or Stroke/TIA/TE]  2.  Essential hypertension  3.  Hypercholesterolemia    Current medicines are reviewed at length with the patient today.  The patient does not have concerns regarding medicines.  The following changes have been made:  We are stopping her vitamin E.  We are starting Apixaban 5 mg twice a day.  We are stopping her baby aspirin.  We are increasing her diltiazem 120 mg twice a day.  We are decreasing her Micardis to 40 mg daily to allow more rate control.  She will continue her Toprol 50 mg twice a day. We will get an echocardiogram.  Today we are checking a CBC and basal metabolic panel.  Labs/ tests ordered today include:   Orders Placed This Encounter  Procedures  . Basic metabolic panel  . CBC with Differential/Platelet  . EKG 12-Lead  . ECHOCARDIOGRAM COMPLETE     Disposition:   The patient will return in one week for follow-up office visit and EKG.  Initial strategy will be rate control and anticoagulation.  After 4 weeks of anticoagulation, if still in atrial fibrillation we can consider elective cardioversion.  It sounds from her history that she may have been going in and out of atrial fibrillation at night for a while.  Berna Spare MD 08/05/2015 6:26 PM    [facility] [address], [place], [place]  [address] Phone: [phone]; Fax: [phone]

## 2015-08-05 NOTE — Telephone Encounter (Signed)
Patient having irregular heart beats with shortness of breath  Scheduled ov for patient today

## 2015-08-05 NOTE — Telephone Encounter (Signed)
PA # [id number] for Eliquis Good until 06/17/16 [phone]

## 2015-08-05 NOTE — Patient Instructions (Signed)
Medication Instructions:  STOP VITAMIN E  STOP ASPIRIN   START ELIQUIS 5 MG TWICE A DAY  DECREASE YOUR MICARDIS TO 1/2 TABLET (40 MG) DAILY   INCREASE YOUR DILTIAZEM TO 120 MG TWICE A DAY  START ELIQUIS TO 5 MG TWICE A DAY  Labwork: CBC/BMET  Testing/Procedures: Your physician has requested that you have an echocardiogram. Echocardiography is a painless test that uses sound waves to create images of your heart. It provides your doctor with information about the size and shape of your heart and how well your heart's chambers and valves are working. This procedure takes approximately one hour. There are no restrictions for this procedure.  Follow-Up: FOLLOW UP IN 1 WEEK

## 2015-08-06 ENCOUNTER — Telehealth: Payer: Self-pay | Admitting: Nurse Practitioner

## 2015-08-06 NOTE — Telephone Encounter (Signed)
   [name] called this afternoon to report that her blood pressure has been in the 1-teens today.  This comes after being dx with AF yesterday and having her dilt increased to 120 mg BID.  She is also on toprol xl 50 BID and micardis 80, 1/2 tab daily.  She has taken her AM doses of toprol and dilt and is due to take micardis now.  BP is 115.  She is concerned that her BP is going to drop too low and she won't be able to take PM doses of bb/dilt.  I advised that she hold micardis today to see how BP fares off of it.  If BP 130's to 140's off of it, she can resume tomorrow.  OTW, if BP remains in the 1-teens to 120's on bb/dilt alone, she should remain off of micardis until instructed otw.  Caller verbalized understanding and was grateful for the call back.  Murray Hodgkins, NP 08/06/2015, 2:44 PM

## 2015-08-06 NOTE — Progress Notes (Signed)
Quick Note:  Please report to patient. The recent labs are stable. Continue same medication and careful diet. Potassium borderline low. Increase KDur to BID ______

## 2015-08-08 ENCOUNTER — Other Ambulatory Visit: Payer: Self-pay | Admitting: Cardiology

## 2015-08-08 ENCOUNTER — Telehealth: Payer: Self-pay

## 2015-08-08 DIAGNOSIS — F419 Anxiety disorder, unspecified: Secondary | ICD-10-CM

## 2015-08-08 DIAGNOSIS — E876 Hypokalemia: Secondary | ICD-10-CM

## 2015-08-08 MED ORDER — POTASSIUM CHLORIDE CRYS ER 20 MEQ PO TBCR: 20.0000 meq | EXTENDED_RELEASE_TABLET | Freq: Two times a day (BID) | ORAL | Status: DC

## 2015-08-08 NOTE — Telephone Encounter (Signed)
New Message  Pt states that she had to call the on call provider Saturday. Pt would like a call back with clarity on which medications take. Please call   *STAT* If patient is at the pharmacy, call can be transferred to refill team.   1. Which medications need to be refilled? (please list name of each medication and dose if known) ALPRAZolam (XANAX) 0.5 MG tablet  2. Which pharmacy/location (including street and city if local pharmacy) is medication to be sent to? [place]  3. Do they need a 30 day or 90 day supply? 90 day   Pt would like this message routed to [facility] for review.

## 2015-08-08 NOTE — Telephone Encounter (Signed)
Prior auth for Eliquis approved through 06/17/2016. [id number].

## 2015-08-09 ENCOUNTER — Ambulatory Visit (HOSPITAL_COMMUNITY)
Admission: RE | Admit: 2015-08-09 | Discharge: 2015-08-09 | Disposition: A | Discharge status: Home / Self Care | Payer: Medicare Other | Source: Ambulatory Visit | Attending: Cardiology | Admitting: Cardiology

## 2015-08-09 DIAGNOSIS — I480 Paroxysmal atrial fibrillation: Secondary | ICD-10-CM

## 2015-08-09 DIAGNOSIS — I119 Hypertensive heart disease without heart failure: Secondary | ICD-10-CM | POA: Insufficient documentation

## 2015-08-09 DIAGNOSIS — I4891 Unspecified atrial fibrillation: Secondary | ICD-10-CM | POA: Diagnosis present

## 2015-08-09 DIAGNOSIS — I071 Rheumatic tricuspid insufficiency: Secondary | ICD-10-CM | POA: Insufficient documentation

## 2015-08-09 DIAGNOSIS — E785 Hyperlipidemia, unspecified: Secondary | ICD-10-CM | POA: Insufficient documentation

## 2015-08-09 DIAGNOSIS — I340 Nonrheumatic mitral (valve) insufficiency: Secondary | ICD-10-CM | POA: Diagnosis not present

## 2015-08-12 ENCOUNTER — Ambulatory Visit (INDEPENDENT_AMBULATORY_CARE_PROVIDER_SITE_OTHER): Payer: Medicare Other | Admitting: Cardiology

## 2015-08-12 ENCOUNTER — Encounter: Payer: Self-pay | Admitting: Cardiology

## 2015-08-12 VITALS — BP 152/80 | HR 72 | Ht 64.75 in | Wt 146.1 lb

## 2015-08-12 DIAGNOSIS — I119 Hypertensive heart disease without heart failure: Secondary | ICD-10-CM

## 2015-08-12 DIAGNOSIS — I480 Paroxysmal atrial fibrillation: Secondary | ICD-10-CM | POA: Diagnosis not present

## 2015-08-12 LAB — BASIC METABOLIC PANEL
BUN: 14 mg/dL (ref 7–25)
CO2: 28 mmol/L (ref 20–31)
Calcium: 10.2 mg/dL (ref 8.6–10.4)
Chloride: 98 mmol/L (ref 98–110)
Creat: 0.73 mg/dL (ref 0.60–0.93)
Glucose, Bld: 102 mg/dL — ABNORMAL HIGH (ref 65–99)
Potassium: 4.1 mmol/L (ref 3.5–5.3)
Sodium: 136 mmol/L (ref 135–146)

## 2015-08-12 MED ORDER — DILTIAZEM HCL ER COATED BEADS 180 MG PO CP24: 180.0000 mg | ORAL_CAPSULE | Freq: Every day | ORAL | Status: DC

## 2015-08-12 NOTE — Progress Notes (Signed)
Cardiology Office Note   Date:  08/12/2015   ID:  Robin [name] 02-Aug-1934, MRN [id number]  PCP:  Robin Stalker, PA-C  Cardiologist: Robin Coco MD  Chief Complaint  Patient presents with  . scheduled follow up    new onset a fib      History of Present Illness: Robin [name] is a 81 y.o. female who presents for follow-up office visit.  We had seen her one week ago at which time she was in atrial flutter fibrillation.  She had a past history of palpitations but had never had documented atrial flutter fibrillation.  She also has a past history of essential hypertension.  We initially saw her in July 2011. She does not have any history of ischemic heart disease. She had a previous normal nuclear stress test in August 2009 at [facility]. She also had a normal echocardiogram and normal cardiac catheterization. Since last visit she's been doing well. She has had some palpitations. She states that she has white coat syndrome when she comes to the doctor's office. She states that years ago she was told she had mitral valve prolapse. Since last visit she had an echocardiogram on 05/09/12 which showed an ejection fraction of 55-60% and showed mild mitral regurgitation.  The patient does not have any history of TIA or stroke.  She has a history of borderline hypokalemia and at her visit one week ago her potassium was 3.5 and we doubled her Kdur up to 6meq Twice a day. At her last visit she was started on Apixaban and we increased her diltiazem from 120 mg up to 240 mg daily.  She has felt weak and tired on this higher dose. She has not felt any recurrent atrial fibrillation.  She is in sinus rhythm today. We reviewed her echocardiogram 08/09/15.  It shows left ventricular ejection fraction of 55-60% and there is mild MR, mild TR, trivial PI, and no AI.  Past Medical History  Diagnosis Date  . Hypertension   . Hyperlipidemia   . AV bloc first  degree   . Anxiety   . Vertigo   . Venous insufficiency of both lower extremities   . Lumbar pain     fell off of her horse and has low back pain at times    Past Surgical History  Procedure Laterality Date  . Cholecystectomy    . Lung surgery    . Abdominal hysterectomy    . Eye surgery      bil cataract surgery  . Breast lumpectomy with radioactive seed localization Left 05/05/2015    Procedure: LEFT BREAST LUMPECTOMY WITH RADIOACTIVE SEED LOCALIZATION;  Surgeon: Robin Messing III, MD;  Location: [place];  Service: General;  Laterality: Left;     Current Outpatient Prescriptions  Medication Sig Dispense Refill  . ALPRAZolam (XANAX) 0.5 MG tablet Take 0.5 mg by mouth daily as needed (for anxiety).     Marland Kitchen apixaban (ELIQUIS) 5 MG TABS tablet Take 1 tablet (5 mg total) by mouth 2 (two) times daily. 60 tablet 5  . BILBERRY-BIOFLAV-RUTIN-QUERCET PO Take 1,000 mg by mouth.    Marland Kitchen CALCIUM PO Take 1 tablet by mouth daily.     . Cholecalciferol (VITAMIN D PO) Take 2,400 mg by mouth daily.      . diphenoxylate-atropine (LOMOTIL) 2.5-0.025 MG per tablet Take 1 tablet by mouth daily as needed (for diarrhea).     . estradiol (ESTRACE) 1 MG tablet  Take 1 mg by mouth daily.    Marland Kitchen LORazepam (ATIVAN) 0.5 MG tablet TAKE 1 TABLET TWICE DAILY AS NEEDED FOR ANXIETY. 60 tablet 0  . Lutein 20 MG CAPS Take 1 capsule by mouth daily.     . metoprolol (TOPROL-XL) 100 MG 24 hr tablet Take by mouth daily. 1/2 tab in am and 1/2 tab  in pm    . potassium chloride SA (K-DUR,KLOR-CON) 20 MEQ tablet Take 1 tablet (20 mEq total) by mouth 2 (two) times daily. 180 tablet 1  . telmisartan (MICARDIS) 80 MG tablet Take 80 mg by mouth as directed. 1/2 tablet by mouth daily    . triamcinolone (NASACORT ALLERGY 24HR) 55 MCG/ACT AERO nasal inhaler Place 2 sprays into the nose daily.    Marland Kitchen triamcinolone cream (KENALOG) 0.1 % Apply 1 application topically daily as needed (for rash).     . Triamterene-HCTZ (DYAZIDE  PO) Take 25 mg by mouth daily.      Marland Kitchen VITAMIN A PO Take 5,000 mg by mouth daily.      Marland Kitchen diltiazem (CARDIZEM CD) 180 MG 24 hr capsule Take 1 capsule (180 mg total) by mouth daily. 30 capsule 5   No current facility-administered medications for this visit.    Allergies:   Iodinated diagnostic agents; Amlodipine; Codeine; Diphenhydramine; Diphenhydramine hcl; Pravastatin; Sulfa antibiotics; and Sulfa drugs cross reactors    Social History:  The patient  reports that she has never smoked. She does not have any smokeless tobacco history on file. She reports that she drinks alcohol. She reports that she does not use illicit drugs.   Family History:  The patient's family history includes Cancer in her sister; Heart disease in her father and mother; Hypertension in her brother, mother, sister, sister, and sister; Stroke in her mother.    ROS:  Please see the history of present illness.   Otherwise, review of systems are positive for none.   All other systems are reviewed and negative.    PHYSICAL EXAM: VS:  BP 152/80 mmHg  Pulse 72  Ht 5' 4.75" (1.645 m)  Wt 146 lb 1.9 oz (66.28 kg)  BMI 24.49 kg/m2 , BMI Body mass index is 24.49 kg/(m^2). GEN: Well nourished, well developed, in no acute distress HEENT: normal Neck: no JVD, carotid bruits, or masses Cardiac: RRR; no murmurs, rubs, or gallops,no edema  Respiratory:  clear to auscultation bilaterally, normal work of breathing GI: soft, nontender, nondistended, + BS MS: no deformity or atrophy Skin: warm and dry, no rash Neuro:  Strength and sensation are intact Psych: euthymic mood, full affect   EKG:  EKG is ordered today. The ekg ordered today demonstrates normal sinus rhythm with first-degree AV block.  Since the prior tracing of 08/05/15, normal sinus rhythm has replaced atrial flutter fibrillation.   Recent Labs: 08/05/2015: BUN 15; Creat 0.76; Hemoglobin 14.8; Platelets 292; Potassium 3.5; Sodium 139    Lipid Panel      Component Value Date/Time   CHOL 213* 06/14/2011 0832   TRIG 85.0 06/14/2011 0832   HDL 32.60* 06/14/2011 0832   CHOLHDL 7 06/14/2011 0832   VLDL 17.0 06/14/2011 0832   LDLDIRECT 161.5 06/14/2011 0832      Wt Readings from Last 3 Encounters:  08/12/15 146 lb 1.9 oz (66.28 kg)  08/05/15 146 lb 6.4 oz (66.407 kg)  07/13/15 148 lb (67.132 kg)         ASSESSMENT AND PLAN:  1. Paroxysmal atrial flutter fibrillation.This patients CHA2DS2-VASc Score is  4 for age female sex and hypertension.  She is back in normal sinus rhythm today Above score calculated as 1 point each if present [CHF, HTN, DM, Vascular=MI/PAD/Aortic Plaque, Age if 65-74, or Female] Above score calculated as 2 points each if present [Age > 75, or Stroke/TIA/TE]  2. Essential hypertension  3. Hypercholesterolemia  4.  Hypokalemia.  We are checking a BMET today.  5.  Malaise and fatigue.   Current medicines are reviewed at length with the patient today.  The patient does have concerns regarding medicines.  The following changes have been made:  Reduce diltiazem to 180 mg extended release once a day  Labs/ tests ordered today include:   Orders Placed This Encounter  Procedures  . Basic metabolic panel  . EKG 12-Lead     Disposition:   She will return in one to 2 months for follow-up with Dr. Oval Linsey for office visit and EKG  Signed, Robin Coco MD 08/12/2015 11:50 AM    [address], [place], [place]  [address] Phone: [phone]; Fax: [phone]

## 2015-08-12 NOTE — Patient Instructions (Signed)
Medication Instructions START DILTIAZEM CD 180 MG DAILY  STOP DILTIAZEM 120   Labwork: BMET  Testing/Procedures: NONE  Follow-Up: Your physician recommends that you schedule a follow-up appointment in: DR [facility] [place] 1-2 MONTHS  If you need a refill on your cardiac medications before your next appointment, please call your pharmacy.

## 2015-08-15 ENCOUNTER — Telehealth: Payer: Self-pay | Admitting: Cardiology

## 2015-08-15 DIAGNOSIS — Z79899 Other long term (current) drug therapy: Secondary | ICD-10-CM

## 2015-08-15 NOTE — Telephone Encounter (Signed)
New message ° ° ° ° °Returning a call to the nurse °

## 2015-08-15 NOTE — Telephone Encounter (Signed)
Patient stated she was having loose stools with the 2 potassium Discussed with  Dr. Mare Ferrari and ok to decrease back to 1 K+ a day, increase potassium rich foods, and follow up lab in 1 month Advised patient, verbalized understanding

## 2015-08-15 NOTE — Telephone Encounter (Signed)
-----   Message from Darlin Coco, MD sent at 08/12/2015  8:20 PM EST ----- Potassium is now better. CSD

## 2015-08-18 ENCOUNTER — Telehealth: Payer: Self-pay | Admitting: Physician Assistant

## 2015-08-18 ENCOUNTER — Telehealth: Payer: Self-pay | Admitting: Cardiology

## 2015-08-18 NOTE — Telephone Encounter (Signed)
Please call,question about her medication.

## 2015-08-18 NOTE — Telephone Encounter (Signed)
Patient feels jittery every am when she gets up until about 10 am Patient coming for ov in am

## 2015-08-18 NOTE — Telephone Encounter (Signed)
Patient with past medical history of atrial fibrillation on eliquis, Toprol-XL and diltiazem called her this morning for jittery feeling. By 6 PM, she feels she went back into atrial fibrillation. She checked her blood pressure her systolic blood pressure is 160. Her heart rate is 116.  She has not taken her nighttime dose of 50 mg Toprol-XL. I have advised her to take an additional 50 mg tonight for total 100 mg at nighttime to help control the heart rate. She has a office visit tomorrow to further discuss medication adjustment. Of note, she is on 50 twice a day of Toprol-XL. She is also on 180 mg diltiazem CD.  Hilbert Corrigan PA Pager: [phone]

## 2015-08-19 ENCOUNTER — Encounter: Payer: Self-pay | Admitting: Cardiology

## 2015-08-19 ENCOUNTER — Ambulatory Visit
Admission: RE | Admit: 2015-08-19 | Discharge: 2015-08-19 | Disposition: A | Discharge status: Home / Self Care | Payer: Medicare Other | Source: Ambulatory Visit | Attending: Cardiology | Admitting: Cardiology

## 2015-08-19 ENCOUNTER — Ambulatory Visit (INDEPENDENT_AMBULATORY_CARE_PROVIDER_SITE_OTHER): Payer: Medicare Other | Admitting: Cardiology

## 2015-08-19 VITALS — BP 120/80 | HR 72 | Ht 64.75 in | Wt 145.8 lb

## 2015-08-19 DIAGNOSIS — Z79899 Other long term (current) drug therapy: Secondary | ICD-10-CM | POA: Diagnosis not present

## 2015-08-19 DIAGNOSIS — I480 Paroxysmal atrial fibrillation: Secondary | ICD-10-CM

## 2015-08-19 DIAGNOSIS — R002 Palpitations: Secondary | ICD-10-CM | POA: Diagnosis not present

## 2015-08-19 DIAGNOSIS — E876 Hypokalemia: Secondary | ICD-10-CM | POA: Diagnosis not present

## 2015-08-19 LAB — HEPATIC FUNCTION PANEL
ALT: 15 U/L (ref 6–29)
AST: 18 U/L (ref 10–35)
Albumin: 4.2 g/dL (ref 3.6–5.1)
Alkaline Phosphatase: 72 U/L (ref 33–130)
Bilirubin, Direct: 0.2 mg/dL (ref <=0.2)
Indirect Bilirubin: 0.9 mg/dL (ref 0.2–1.2)
Total Bilirubin: 1.1 mg/dL (ref 0.2–1.2)
Total Protein: 6.5 g/dL (ref 6.1–8.1)

## 2015-08-19 LAB — BASIC METABOLIC PANEL
BUN: 14 mg/dL (ref 7–25)
CO2: 30 mmol/L (ref 20–31)
Calcium: 10.6 mg/dL — ABNORMAL HIGH (ref 8.6–10.4)
Chloride: 98 mmol/L (ref 98–110)
Creat: 0.71 mg/dL (ref 0.60–0.93)
Glucose, Bld: 100 mg/dL — ABNORMAL HIGH (ref 65–99)
Potassium: 3.8 mmol/L (ref 3.5–5.3)
Sodium: 137 mmol/L (ref 135–146)

## 2015-08-19 LAB — T4, FREE: Free T4: 1.4 ng/dL (ref 0.8–1.8)

## 2015-08-19 LAB — TSH: TSH: 3.17 mIU/L

## 2015-08-19 NOTE — Progress Notes (Signed)
Cardiology Office Note   Date:  08/19/2015   ID:  [name] [birth year], MRN [id number]  PCP:  [name], PA-C  Cardiologist: Darlin Coco MD  Chief Complaint  Patient presents with  . Palpitations  . Dizziness      History of Present Illness: Robin [name] is a 81 y.o. female who presents for work in visit.  Robin [name] is a 81 y.o. female who presents for follow-up office visit. She has a history of recently documented atrial flutter fibrillation.  She continues to have bothersome palpitations. We initially saw her in July 2011. She does not have any history of ischemic heart disease. She had a previous normal nuclear stress test in August 2009 at [facility]. She also had a normal echocardiogram and normal cardiac catheterization. Since last visit she's been doing well. She has had some palpitations. She states that she has white coat syndrome when she comes to the doctor's office. She states that years ago she was told she had mitral valve prolapse. Since last visit she had an echocardiogram on 05/09/12 which showed an ejection fraction of 55-60% and showed mild mitral regurgitation. The patient does not have any history of TIA or stroke. She has a history of borderline hypokalemia and is on potassium supplementation.  She is on Apixaban for her anticoagulation. We reviewed her echocardiogram 08/09/15. It shows left ventricular ejection fraction of 55-60% and there is mild MR, mild TR, trivial PI, and no AI. Last evening she was aware of palpitations and spoke with the doctor on call who advised taking an extra 50 mg of metoprolol.  This appeared to have helped and she is back in normal sinus rhythm today  Past Medical History  Diagnosis Date  . Hypertension   . Hyperlipidemia   . AV bloc first degree   . Anxiety   . Vertigo   . Venous insufficiency of both lower extremities   . Lumbar pain     fell off of her  horse and has low back pain at times    Past Surgical History  Procedure Laterality Date  . Cholecystectomy    . Lung surgery    . Abdominal hysterectomy    . Eye surgery      bil cataract surgery  . Breast lumpectomy with radioactive seed localization Left 05/05/2015    Procedure: LEFT BREAST LUMPECTOMY WITH RADIOACTIVE SEED LOCALIZATION;  Surgeon: Autumn Messing III, MD;  Location: [place];  Service: General;  Laterality: Left;     Current Outpatient Prescriptions  Medication Sig Dispense Refill  . apixaban (ELIQUIS) 5 MG TABS tablet Take 1 tablet (5 mg total) by mouth 2 (two) times daily. 60 tablet 5  . BILBERRY-BIOFLAV-RUTIN-QUERCET PO Take 1,000 mg by mouth.    Marland Kitchen CALCIUM PO Take 1 tablet by mouth daily.     . Cholecalciferol (VITAMIN D PO) Take 2,400 mg by mouth daily.      Marland Kitchen diltiazem (CARDIZEM CD) 180 MG 24 hr capsule Take 1 capsule (180 mg total) by mouth daily. 30 capsule 5  . diphenoxylate-atropine (LOMOTIL) 2.5-0.025 MG per tablet Take 1 tablet by mouth daily as needed (for diarrhea).     . estradiol (ESTRACE) 1 MG tablet Take 1 mg by mouth daily.    Marland Kitchen LORazepam (ATIVAN) 0.5 MG tablet Take 0.5 mg by mouth 2 (two) times daily as needed for anxiety.    . Lutein 20 MG CAPS Take 1  capsule by mouth daily.     . metoprolol (TOPROL-XL) 100 MG 24 hr tablet Take by mouth as directed. 1 TABLET BY MOUTH IN THE MORNING AND 1/2 IN THE EVENING    . potassium chloride SA (K-DUR,KLOR-CON) 20 MEQ tablet Take 20 mEq by mouth daily.    Marland Kitchen triamcinolone (NASACORT ALLERGY 24HR) 55 MCG/ACT AERO nasal inhaler Place 2 sprays into the nose daily.    Marland Kitchen triamcinolone cream (KENALOG) 0.1 % Apply 1 application topically daily as needed (for rash).     . Triamterene-HCTZ (DYAZIDE PO) Take 25 mg by mouth daily.      Marland Kitchen VITAMIN A PO Take 5,000 mg by mouth daily.       No current facility-administered medications for this visit.    Allergies:   Iodinated diagnostic agents; Amlodipine;  Codeine; Diphenhydramine; Diphenhydramine hcl; Pravastatin; Sulfa antibiotics; and Sulfa drugs cross reactors    Social History:  The patient  reports that she has never smoked. She does not have any smokeless tobacco history on file. She reports that she drinks alcohol. She reports that she does not use illicit drugs.   Family History:  The patient's family history includes Cancer in her sister; Heart disease in her father and mother; Hypertension in her brother, mother, sister, sister, and sister; Stroke in her mother.    ROS:  Please see the history of present illness.   Otherwise, review of systems are positive for none.   All other systems are reviewed and negative.    PHYSICAL EXAM: VS:  BP 120/80 mmHg  Pulse 72  Ht 5' 4.75" (1.645 m)  Wt 145 lb 12.8 oz (66.134 kg)  BMI 24.44 kg/m2 , BMI Body mass index is 24.44 kg/(m^2). GEN: Well nourished, well developed, in no acute distress HEENT: normal Neck: no JVD, carotid bruits, or masses Cardiac: RRR; no murmurs, rubs, or gallops,no edema  Respiratory:  clear to auscultation bilaterally, normal work of breathing GI: soft, nontender, nondistended, + BS MS: no deformity or atrophy Skin: warm and dry, no rash Neuro:  Strength and sensation are intact Psych: euthymic mood, full affect   EKG:  EKG is ordered today. The ekg ordered today demonstrates normal sinus rhythm at 70 bpm with first-degree AV block and PR interval of 228 ms.  No ischemic changes.   Recent Labs: 08/05/2015: Hemoglobin 14.8; Platelets 292 08/12/2015: BUN 14; Creat 0.73; Potassium 4.1; Sodium 136    Lipid Panel    Component Value Date/Time   CHOL 213* 06/14/2011 0832   TRIG 85.0 06/14/2011 0832   HDL 32.60* 06/14/2011 0832   CHOLHDL 7 06/14/2011 0832   VLDL 17.0 06/14/2011 0832   LDLDIRECT 161.5 06/14/2011 0832      Wt Readings from Last 3 Encounters:  08/19/15 145 lb 12.8 oz (66.134 kg)  08/12/15 146 lb 1.9 oz (66.28 kg)  08/05/15 146 lb 6.4 oz  (66.407 kg)         ASSESSMENT AND PLAN:  1. Paroxysmal atrial flutter fibrillation.This patients CHA2DS2-VASc Score is 4 for age female sex and hypertension. She is back in normal sinus rhythm today Above score calculated as 1 point each if present [CHF, HTN, DM, Vascular=MI/PAD/Aortic Plaque, Age if 65-74, or Female] Above score calculated as 2 points each if present [Age > 75, or Stroke/TIA/TE]  2. Essential hypertension  3. Hypercholesterolemia  4. Hypokalemia. We are checking a BMET today.  5. Malaise and fatigue.   Current medicines are reviewed at length with the patient today.  The patient has concerns regarding medicines.  The following changes have been made:  We are increasing her Toprol XL 100 mg in the morning and continue 50 mg in the evening.  Labs/ tests ordered today include:   Orders Placed This Encounter  Procedures  . DG Chest 2 View  . TSH  . Hepatic function panel  . Basic metabolic panel  . T4, free  . EKG 12-Lead     Disposition:  We are checking additional lab work today including TSH, free T4, hepatic function panel, and basal metabolic panel.  We will get a chest x-ray today.  To make room for the higher dose of beta blocker we will stop Micardis.  Consider low dose amiodarone if palpitations persist.  Alternatively, could consider flecainide.  She has had a prior normal heart cath. She will be following up with Dr. Oval Linsey after my retirement.  Berna Spare MD 08/19/2015 9:50 AM    [address], [place], [place]  [address] Phone: [phone]; Fax: [phone]

## 2015-08-19 NOTE — Patient Instructions (Addendum)
Medication Instructions:  INCREASE YOUR TOPROL (METOPROLOL) TO 100 MG IN THE MORNING AND 50 MG IN THE EVENING  STOP MICARDIS   Labwork: FT4/TSH/HFP/BMET  Testing/Procedures: A chest x-ray takes a picture of the organs and structures inside the chest, including the heart, lungs, and blood vessels. This test can show several things, including, whether the heart is enlarges; whether fluid is building up in the lungs; and whether pacemaker / defibrillator leads are still in place. [facility] [place]   Follow-Up: KEEP SCHEDULED APPOINTMENT WITH DR [facility]  If you need a refill on your cardiac medications before your next appointment, please call your pharmacy.

## 2015-08-19 NOTE — Progress Notes (Signed)
Quick Note:  Please report to patient. The recent labs are stable. Continue same medication and careful diet. Thyroid function is stable ______

## 2015-08-23 ENCOUNTER — Telehealth: Payer: Self-pay | Admitting: *Deleted

## 2015-08-23 ENCOUNTER — Telehealth: Payer: Self-pay | Admitting: Cardiovascular Disease

## 2015-08-23 NOTE — Telephone Encounter (Signed)
-----   Message from Darlin Coco, MD sent at 08/19/2015  4:53 PM EST ----- Please report.  Chest x-ray is stable.  The heart is not enlarged.  There are changes consistent with COPD.  No evidence of heart failure.

## 2015-08-23 NOTE — Telephone Encounter (Signed)
New message ° ° ° ° °Returning a call to the nurse °

## 2015-08-23 NOTE — Telephone Encounter (Signed)
-----   Message from Darlin Coco, MD sent at 08/19/2015  4:54 PM EST ----- Please report to patient.  The recent labs are stable. Continue same medication and careful diet.  Thyroid function is stable

## 2015-08-23 NOTE — Telephone Encounter (Signed)
Advised patient of results.  

## 2015-08-23 NOTE — Telephone Encounter (Signed)
Patient is concerned with her blood pressure running in the 140's-150's but drops to 120's-130's with rest, heart rate running 50's-70's At night prior to her evening dose of Metoprolol her heart rate has been as low as 52, am heart rate normally above 70 Concerned about heart rate dropping with the additional Metoprolol  She has most of her elevated heart rates during the night and early am Will forward to Tera Helper NP for review

## 2015-08-24 NOTE — Telephone Encounter (Signed)
Ok to continue but will need to monitor. May need to consider placing monitor.

## 2015-08-25 NOTE — Telephone Encounter (Signed)
Left message to call back  

## 2015-08-26 NOTE — Telephone Encounter (Signed)
Returning your call from yesterday. °

## 2015-08-26 NOTE — Telephone Encounter (Signed)
Spoke with patient, states her blood pressure seems to be doing better Does continue to have "an episode once a day"  Stated they are getting shorter and not lasting as long, down to about 20 minutes Did mention monitor placement but told her I would follow up with her on Monday Advised if any problems over weekend she could call on call doctor for recommendations, verbalized understanding

## 2015-08-29 NOTE — Telephone Encounter (Signed)
Left message to call back  

## 2015-08-30 NOTE — Telephone Encounter (Signed)
Follow Up: ° ° ° °Returning your call from yesterday. °

## 2015-08-31 ENCOUNTER — Other Ambulatory Visit: Payer: Self-pay | Admitting: Family Medicine

## 2015-08-31 DIAGNOSIS — R918 Other nonspecific abnormal finding of lung field: Secondary | ICD-10-CM

## 2015-09-13 ENCOUNTER — Other Ambulatory Visit: Payer: Medicare Other

## 2015-09-21 NOTE — Telephone Encounter (Signed)
Pt would like for [facility] or a nurse to call her back in regards to her Afib. Please f/u with pt  Thanks

## 2015-09-21 NOTE — Telephone Encounter (Signed)
i returned patient's call. She's been having some intermittent palpitations. She states HR when checked today w BP check was 85. BP 145/80. Wanted to make sure the HR of 85 was OK. I did make her aware that these readings were fine, though if she is having A Fib the cuff reading may not be accurate for her pulse. Pt aware to call or seek urgent attention for new SOB, onset of dizziness/weakness, etc.  She has visit tomorrow at 2pm w/ Dr. Oval Linsey for establish care (former [facility] patient) and I advised appropriate to follow up then to address concerns. Appropriate to consider monitor placement or other intervention as warranted by physician.

## 2015-09-22 ENCOUNTER — Ambulatory Visit (INDEPENDENT_AMBULATORY_CARE_PROVIDER_SITE_OTHER): Payer: Medicare Other | Admitting: Cardiovascular Disease

## 2015-09-22 ENCOUNTER — Encounter: Payer: Self-pay | Admitting: Cardiovascular Disease

## 2015-09-22 VITALS — BP 140/70 | HR 64 | Ht 64.75 in | Wt 145.0 lb

## 2015-09-22 DIAGNOSIS — I480 Paroxysmal atrial fibrillation: Secondary | ICD-10-CM | POA: Diagnosis not present

## 2015-09-22 DIAGNOSIS — I491 Atrial premature depolarization: Secondary | ICD-10-CM

## 2015-09-22 DIAGNOSIS — I10 Essential (primary) hypertension: Secondary | ICD-10-CM | POA: Diagnosis not present

## 2015-09-22 NOTE — Progress Notes (Signed)
Cardiology Office Note   Date:  09/22/2015   ID:  Monicka E. Meredeth Ide 1934-08-03, MRN [id number]  PCP:  [name], PA-C  Cardiologist:   Sharol Harness, MD   Chief Complaint  Patient presents with  . Appointment    had chest pains yesterday. SHOB with steps and a lot of walking. a lot of tiredness and fatigue. No swelling.       History of Present Illness: Cecilie E. Susa Griffins is a 81 y.o. female with hypertension, paroxysmal atrial fibrillation, and hyperlipidemia who presents for follow-up.  [name] is a former patient of Dr. Mare Ferrari. She saw him on 09/15/15, at which time she continued to note recurrent palpitations.  She did an extra dose of metoprolol during one of these episodes which helped and she was back in sinus rhythm at the time she saw Dr. Mare Ferrari.  Her metoprolol was increased at that appointment to 100 mg in the morning and in the 50 evening.  After that appointment she has noted fluctuations in her blood pressures in the 120s to 130s at rest but up to the 140s to 150s with exertion. Her heart rate has been lower in the evening.  Two evenings ago she noted palpitations.  She felt like her heart was beating fast and skipping beats.  She has a home monitor that indicated that her heart reate was irregular.  The episode lasted through yesterday afternoon.  Her maximum heart rate during that time was 90.  The max BP was 146/87. When her heart is irregular she feels very tired and weak.  She has a hard time walking.  Episodes last from an hour up to days at a time.  The episode felt like a prolonged panic attack. She sometimes tries taking extra metoprolol or a xanax which helps.  She did not have any chest pain but did feel short of breath.  She denies lower extremity edema, orthopnea or PND.    She walks for exercise daily.  She walks her dog for usually 1 mile daily.  She notes shortness of breath when walking up an incline but denies chest pain.   Past Medical  History  Diagnosis Date  . Hypertension   . Hyperlipidemia   . AV bloc first degree   . Anxiety   . Vertigo   . Venous insufficiency of both lower extremities   . Lumbar pain     fell off of her horse and has low back pain at times    Past Surgical History  Procedure Laterality Date  . Cholecystectomy    . Lung surgery    . Abdominal hysterectomy    . Eye surgery      bil cataract surgery  . Breast lumpectomy with radioactive seed localization Left 05/05/2015    Procedure: LEFT BREAST LUMPECTOMY WITH RADIOACTIVE SEED LOCALIZATION;  Surgeon: Autumn Messing III, MD;  Location: [place];  Service: General;  Laterality: Left;     Current Outpatient Prescriptions  Medication Sig Dispense Refill  . apixaban (ELIQUIS) 5 MG TABS tablet Take 1 tablet (5 mg total) by mouth 2 (two) times daily. 60 tablet 5  . BILBERRY-BIOFLAV-RUTIN-QUERCET PO Take 1,000 mg by mouth.    Marland Kitchen CALCIUM PO Take 1 tablet by mouth daily.     . Cholecalciferol (VITAMIN D PO) Take 2,400 mg by mouth daily.      Marland Kitchen diltiazem (CARDIZEM CD) 180 MG 24 hr capsule Take 1 capsule (180 mg total) by  mouth daily. 30 capsule 5  . diphenoxylate-atropine (LOMOTIL) 2.5-0.025 MG per tablet Take 1 tablet by mouth daily as needed (for diarrhea).     . estradiol (ESTRACE) 1 MG tablet Take 1 mg by mouth daily.    Marland Kitchen LORazepam (ATIVAN) 0.5 MG tablet Take 0.5 mg by mouth 2 (two) times daily as needed for anxiety.    . Lutein 20 MG CAPS Take 1 capsule by mouth daily.     . metoprolol (TOPROL-XL) 100 MG 24 hr tablet TAKE 1/2 TABLET BY MOUTH IN THE MORNING AND 1 TABLET IN THE EVENING BY MOUTH DAILY    . potassium chloride SA (K-DUR,KLOR-CON) 20 MEQ tablet Take 20 mEq by mouth daily.    Marland Kitchen triamcinolone (NASACORT ALLERGY 24HR) 55 MCG/ACT AERO nasal inhaler Place 2 sprays into the nose daily.    Marland Kitchen triamcinolone cream (KENALOG) 0.1 % Apply 1 application topically daily as needed (for rash).     . Triamterene-HCTZ (DYAZIDE PO) Take 25  mg by mouth daily.      Marland Kitchen VITAMIN A PO Take 5,000 mg by mouth daily.       No current facility-administered medications for this visit.    Allergies:   Iodinated diagnostic agents; Amlodipine; Codeine; Diphenhydramine; Diphenhydramine hcl; Pravastatin; Sulfa antibiotics; and Sulfa drugs cross reactors    Social History:  The patient  reports that she has never smoked. She does not have any smokeless tobacco history on file. She reports that she drinks alcohol. She reports that she does not use illicit drugs.   Family History:  The patient's family history includes Cancer in her sister; Heart disease in her father and mother; Hypertension in her brother, mother, sister, sister, and sister; Stroke in her mother.    ROS:  Please see the history of present illness.   Otherwise, review of systems are positive for none.   All other systems are reviewed and negative.    PHYSICAL EXAM: VS:  BP 140/70 mmHg  Pulse 64  Ht 5' 4.75" (1.645 m)  Wt 65.772 kg (145 lb)  BMI 24.31 kg/m2 , BMI Body mass index is 24.31 kg/(m^2). GENERAL:  Well appearing HEENT:  Pupils equal round and reactive, fundi not visualized, oral mucosa unremarkable NECK:  No jugular venous distention, waveform within normal limits, carotid upstroke brisk and symmetric, no bruits, no thyromegaly LYMPHATICS:  No cervical adenopathy LUNGS:  Clear to auscultation bilaterally HEART:  RRR.  PMI not displaced or sustained,S1 and S2 within normal limits, no S3, no S4, no clicks, no rubs, no murmurs ABD:  Flat, positive bowel sounds normal in frequency in pitch, no bruits, no rebound, no guarding, no midline pulsatile mass, no hepatomegaly, no splenomegaly EXT:  2 plus pulses throughout, no edema, no cyanosis no clubbing SKIN:  No rashes no nodules NEURO:  Cranial nerves II through XII grossly intact, motor grossly intact throughout PSYCH:  Cognitively intact, oriented to person place and time    EKG:  EKG is ordered today. The ekg  ordered today demonstrates sinus arrhythmia.  Rate 64 bpm.     Recent Labs: 08/05/2015: Hemoglobin 14.8; Platelets 292 08/19/2015: ALT 15; BUN 14; Creat 0.71; Potassium 3.8; Sodium 137; TSH 3.17    Lipid Panel    Component Value Date/Time   CHOL 213* 06/14/2011 0832   TRIG 85.0 06/14/2011 0832   HDL 32.60* 06/14/2011 0832   CHOLHDL 7 06/14/2011 0832   VLDL 17.0 06/14/2011 0832   LDLDIRECT 161.5 06/14/2011 [id number]  Wt Readings from Last 3 Encounters:  09/22/15 65.772 kg (145 lb)  08/19/15 66.134 kg (145 lb 12.8 oz)  08/12/15 66.28 kg (146 lb 1.9 oz)      ASSESSMENT AND PLAN:  # Paroxysmal atrial fibrillation/flutter # PACs: It seems most likely that Ms. Sink is experiencing recurrent atrial fibrillation/flutter given the duration of her symptoms.  Her heart rate and BP are well-controlled but she is symptomatic.  I offered the option of trying an antiarrhythmic.  She is not interested in starting any new meds at this time.  She will continue metoprolol, diltiazem and Eliquis.  If she continues to have prolonged episodes, she will call and come in for an EKG.  No changes for now.  # Hypertension: BP well-controlled.  Continue metoprolol, HCTZ/triamterene and diltiazem.     Current medicines are reviewed at length with the patient today.  The patient does not have concerns regarding medicines.  The following changes have been made:  no change  Labs/ tests ordered today include:  No orders of the defined types were placed in this encounter.    Time spent: 25 minutes-Greater than 50% of this time was spent in counseling, explanation of diagnosis, planning of further management, and coordination of care.  Disposition:   FU with Cimone Fahey C. Oval Linsey, MD, [facility] in 2 months.     This note was written with the assistance of speech recognition software.  Please excuse any transcriptional errors.  Signed, Cielo Arias C. Oval Linsey, MD, [facility]  09/22/2015 5:41 PM    [facility]

## 2015-09-22 NOTE — Patient Instructions (Signed)
Medication Instructions:  Your physician recommends that you continue on your current medications as directed. Please refer to the Current Medication list given to you today.  Labwork: none  Testing/Procedures: none  Follow-Up: Your physician recommends that you schedule a follow-up appointment in:  2 month ov  If you need a refill on your cardiac medications before your next appointment, please call your pharmacy.

## 2015-10-10 ENCOUNTER — Ambulatory Visit: Payer: Medicare Other | Admitting: Cardiovascular Disease

## 2015-10-17 ENCOUNTER — Ambulatory Visit
Admission: RE | Admit: 2015-10-17 | Discharge: 2015-10-17 | Disposition: A | Discharge status: Home / Self Care | Payer: Medicare Other | Source: Ambulatory Visit | Attending: Family Medicine | Admitting: Family Medicine

## 2015-10-17 DIAGNOSIS — R918 Other nonspecific abnormal finding of lung field: Secondary | ICD-10-CM

## 2015-10-19 ENCOUNTER — Ambulatory Visit (INDEPENDENT_AMBULATORY_CARE_PROVIDER_SITE_OTHER): Payer: Medicare Other | Admitting: *Deleted

## 2015-10-19 VITALS — BP 140/80 | HR 91

## 2015-10-19 DIAGNOSIS — I499 Cardiac arrhythmia, unspecified: Secondary | ICD-10-CM | POA: Diagnosis not present

## 2015-10-19 DIAGNOSIS — I440 Atrioventricular block, first degree: Secondary | ICD-10-CM

## 2015-10-19 DIAGNOSIS — R5383 Other fatigue: Secondary | ICD-10-CM

## 2015-10-19 NOTE — Progress Notes (Signed)
1.) Reason for visit: patient woke this morning @ 7:30 am  Heart beating fast then slows down . No chest discomfort feels like "jumping out of my chest" Patient states since sitting out in the lobby no issues presnt  2.) Name of MD requesting visit:  3.) H&P: patient takes metoprolol succ.50 mg in the morning and 100 mg in the evening, she takes diltiazem 180 mg at lunch time. patient took morning dose  the 50 mg metoprolol . She states just fills tired. This was the first time this has occurred in months.  4.) ROS related to problem: blood pressure 140/90 ,ekg done and reviewed by Dr Gwenlyn Found  5.) Assessment and plan per MD: per Dr Gwenlyn Found, increase metoprolol succ. To 100 mg twice a day , wear event monitor for 2 weeks and follow up with Dr Oval Linsey. Patient was given instructions.  Rip Harbour RN also spoke with patient. Patient states she wanted to hold off on monitor placement at present time. She would take the increase dose in the morning, but wanted to know what Dr Oval Linsey suggested.  Melinda informed patient ,she would discuss with Dr Oval Linsey tomorrow and contact patient.

## 2015-10-21 ENCOUNTER — Telehealth: Payer: Self-pay | Admitting: Cardiovascular Disease

## 2015-10-21 NOTE — Telephone Encounter (Signed)
Pt returning call to [facility] -pls call [phone]

## 2015-10-21 NOTE — Telephone Encounter (Signed)
Spoke with patient and she did not increase her Metoprolol secondary to low heart rate She has not had anymore episodes since 10/19/15 If any further episodes ok to place event monitor per Dr Oval Linsey  Will follow up with her next week

## 2015-10-25 NOTE — Telephone Encounter (Signed)
Left message to call back  

## 2015-10-27 NOTE — Telephone Encounter (Signed)
Pt returned call pls call [phone]

## 2015-10-27 NOTE — Telephone Encounter (Signed)
Spoke with patient and she has not had any issues since 10/19/15 Will continue current medications and keep her scheduled follow up and call back if any further issues

## 2015-11-02 ENCOUNTER — Telehealth: Payer: Self-pay | Admitting: Cardiovascular Disease

## 2015-11-02 DIAGNOSIS — R002 Palpitations: Secondary | ICD-10-CM

## 2015-11-02 NOTE — Telephone Encounter (Signed)
Patient c/o Palpitations:  High priority if patient c/o lightheadedness and shortness of breath.  1. How long have you been having palpitations? This afternoon- 5/17  2. Are you currently experiencing lightheadedness and shortness of breath? No  3. Have you checked your BP and heart rate? (document readings) 100-120 pulse  4. Are you experiencing any other symptoms? No

## 2015-11-02 NOTE — Telephone Encounter (Signed)
Spoke with pt, while resting after lunch her pulse began to race. Her pulse was from 110 to 120 bpm. She felt nervous so she took 1/2 of lorazepam. After about one hour her pulse began to fluctuate between 80 and 90. At present her pulse is in the 80's. She feels washed out but otherwise fine. She currently takes metoprolol 50 mg int eh am and 100 mg in the pm. She takes her diltiazem at lunch. She agrees to wearing an event monitor. Order placed and sent to [facility] for scheduling.

## 2015-11-07 ENCOUNTER — Other Ambulatory Visit: Payer: Self-pay | Admitting: Cardiovascular Disease

## 2015-11-07 DIAGNOSIS — R002 Palpitations: Secondary | ICD-10-CM

## 2015-11-07 DIAGNOSIS — I480 Paroxysmal atrial fibrillation: Secondary | ICD-10-CM

## 2015-11-08 ENCOUNTER — Ambulatory Visit (INDEPENDENT_AMBULATORY_CARE_PROVIDER_SITE_OTHER): Payer: Medicare Other

## 2015-11-08 DIAGNOSIS — R002 Palpitations: Secondary | ICD-10-CM

## 2015-11-08 DIAGNOSIS — I480 Paroxysmal atrial fibrillation: Secondary | ICD-10-CM

## 2015-11-27 NOTE — Progress Notes (Signed)
Cardiology Office Note   Date:  11/28/2015   ID:  Janalee E. Meredeth Ide Nov 12, 1934, MRN [id number]  PCP:  [name], PA-C  Cardiologist:   Skeet Latch, MD   Chief Complaint  Patient presents with  . Follow-up    2 months  pt c/o indigestion--pain starts in center of chest and goes around to left side    `  ` History of Present Illness: Robin [name] is a 81 y.o. female with hypertension, paroxysmal atrial fibrillation, and hyperlipidemia and mild carotid stenosis who presents for follow-up.  Robin [name] is a former patient of Dr. Mare Ferrari. She reported recurrent palpitations and was instructed to take an extra tablet of metoprolol as needed.  At her last appointment on 09/2015 she continued to have palpitations.  We discussed starting an antiarrhythmic but she was not interested.  Since that appointment she has been feeling well.  Soon after she started wearing the monitor she noted some heart skipping but it did not feel like atrial fibrillation.  She sometimes notes a sensaion of indigestion that is substernal and radiates under her left breast.  She notes it when eating tomato-based foods.  She denies exertional chest pain or shortness of breath.  She takes Pepcid, which helps.  Robin [name] walks for exercise daily.  She walks her dog for usually 1 mile daily.  She notices shortness of breath when walking up hills when it is hot outside.  She doesn't feel this when it is cooler out.  She checks her blood pressure at home and it typically is well-controlled.  Of note, she had a heart cath in 2009 that she reports was normal.  Carotid Doppler 08/2013 revealed <50% stenosis bilaterally.  She checks her BP at home and it is typically well-controlled.   Past Medical History  Diagnosis Date  . Hypertension   . Hyperlipidemia   . AV bloc first degree   . Anxiety   . Vertigo   . Venous insufficiency of both lower extremities   . Lumbar pain     fell off of her horse and has  low back pain at times    Past Surgical History  Procedure Laterality Date  . Cholecystectomy    . Lung surgery    . Abdominal hysterectomy    . Eye surgery      bil cataract surgery  . Breast lumpectomy with radioactive seed localization Left 05/05/2015    Procedure: LEFT BREAST LUMPECTOMY WITH RADIOACTIVE SEED LOCALIZATION;  Surgeon: Autumn Messing III, MD;  Location: [place];  Service: General;  Laterality: Left;     Current Outpatient Prescriptions  Medication Sig Dispense Refill  . apixaban (ELIQUIS) 5 MG TABS tablet Take 1 tablet (5 mg total) by mouth 2 (two) times daily. 60 tablet 5  . BILBERRY-BIOFLAV-RUTIN-QUERCET PO Take 1,000 mg by mouth.    Marland Kitchen CALCIUM PO Take 1 tablet by mouth daily.     . Cholecalciferol (VITAMIN D PO) Take 2,400 mg by mouth daily.      Marland Kitchen diltiazem (CARDIZEM CD) 180 MG 24 hr capsule Take 1 capsule (180 mg total) by mouth daily. 30 capsule 5  . diphenoxylate-atropine (LOMOTIL) 2.5-0.025 MG per tablet Take 1 tablet by mouth daily as needed (for diarrhea).     . estradiol (ESTRACE) 1 MG tablet Take 1 mg by mouth daily.    . Lactobacillus Rhamnosus, GG, (CULTURELLE PO) Take 1 capsule by mouth every morning.    Marland Kitchen LORazepam (ATIVAN)  0.5 MG tablet Take 0.5 mg by mouth 2 (two) times daily as needed for anxiety.    . Lutein 20 MG CAPS Take 1 capsule by mouth daily.     . metoprolol (TOPROL-XL) 100 MG 24 hr tablet TAKE 1/2 TABLET BY MOUTH IN THE MORNING AND 1 TABLET IN THE EVENING BY MOUTH DAILY    . Multiple Vitamins-Minerals (HAIR SKIN NAILS PO) Take 1 capsule by mouth daily.    . potassium chloride SA (K-DUR,KLOR-CON) 20 MEQ tablet Take 20 mEq by mouth daily.    Marland Kitchen triamcinolone (NASACORT ALLERGY 24HR) 55 MCG/ACT AERO nasal inhaler Place 2 sprays into the nose daily.    Marland Kitchen triamcinolone cream (KENALOG) 0.1 % Apply 1 application topically daily as needed (for rash).     . Triamterene-HCTZ (DYAZIDE PO) Take 25 mg by mouth daily.      . vitamin A 10000  UNIT capsule Take 10,000 Units by mouth daily.     No current facility-administered medications for this visit.    Allergies:   Iodinated diagnostic agents; Amlodipine; Codeine; Diphenhydramine; Diphenhydramine hcl; Pravastatin; Sulfa antibiotics; and Sulfa drugs cross reactors    Social History:  The patient  reports that she has never smoked. She does not have any smokeless tobacco history on file. She reports that she drinks alcohol. She reports that she does not use illicit drugs.   Family History:  The patient's family history includes Cancer in her sister; Heart disease in her father and mother; Hypertension in her brother, mother, sister, sister, and sister; Stroke in her mother.    ROS:  Please see the history of present illness.   Otherwise, review of systems are positive for none.   All other systems are reviewed and negative.    PHYSICAL EXAM: VS:  BP 170/72 mmHg  Pulse 56  Ht 5\' 5"  (1.651 m)  Wt 146 lb 3.2 oz (66.316 kg)  BMI 24.33 kg/m2 , BMI Body mass index is 24.33 kg/(m^2). GENERAL:  Well appearing HEENT:  Pupils equal round and reactive, fundi not visualized, oral mucosa unremarkable NECK:  No jugular venous distention, waveform within normal limits, carotid upstroke brisk and symmetric, no bruits, no thyromegaly LYMPHATICS:  No cervical adenopathy LUNGS:  Clear to auscultation bilaterally HEART:  RRR.  PMI not displaced or sustained,S1 and S2 within normal limits, no S3, no S4, no clicks, no rubs, no murmurs ABD:  Flat, positive bowel sounds normal in frequency in pitch, no bruits, no rebound, no guarding, no midline pulsatile mass, no hepatomegaly, no splenomegaly EXT:  2 plus pulses throughout, no edema, no cyanosis no clubbing SKIN:  No rashes no nodules NEURO:  Cranial nerves II through XII grossly intact, motor grossly intact throughout PSYCH:  Cognitively intact, oriented to person place and time   EKG:  EKG is ordered today. The ekg ordered today  demonstrates sinus arrhythmia.  Rate 64 bpm.    Recent Labs: 08/05/2015: Hemoglobin 14.8; Platelets 292 08/19/2015: ALT 15; BUN 14; Creat 0.71; Potassium 3.8; Sodium 137; TSH 3.17    Lipid Panel    Component Value Date/Time   CHOL 213* 06/14/2011 0832   TRIG 85.0 06/14/2011 0832   HDL 32.60* 06/14/2011 0832   CHOLHDL 7 06/14/2011 0832   VLDL 17.0 06/14/2011 0832   LDLDIRECT 161.5 06/14/2011 0832      Wt Readings from Last 3 Encounters:  11/28/15 146 lb 3.2 oz (66.316 kg)  09/22/15 145 lb (65.772 kg)  08/19/15 145 lb 12.8 oz (66.134 kg)  ASSESSMENT AND PLAN:  # Paroxysmal atrial fibrillation/flutter # PACs: Robin [name] is currently wearing a 30 day event monitor.  She will continue metoprolol, diltiazem and Eliquis.  If she Is having recurrent atrial fibrillation she will consider an antiarrhythmic.  We will check a basic metabolic panel, and she reports palpitations in the past when she is hypokalemic.  # Atypical chest pain: Ms. Janie Morning  symptoms seem most consistent with gastroesophageal reflux disease. She does not have any exertional symptoms occurs after eating classic foods. She is not interested in stress testing at this time.  # Hypertension: BP  Is above goal today.  Repeat BP is better at 148/70.  Given  that she reports it is well controlled at home, we will not make any changes at this time. She will continue to monitor it and call us if her blood pressure is greater than 140/90.  Continue metoprolol, HCTZ/triamterene and diltiazem.    Current medicines are reviewed at length with the patient today.  The patient does not have concerns regarding medicines.  The following changes have been made:  no change  Labs/ tests ordered today include:   Orders Placed This Encounter  Procedures  . Basic Metabolic Panel (BMET)      Disposition:   FU with Dezman Granda C. Oval Linsey, MD, [facility] in 4 months.     This note was written with the assistance of speech recognition  software.  Please excuse any transcriptional errors.  Signed, Cantrell Larouche C. Oval Linsey, MD, [facility]  11/28/2015 4:22 PM    [facility]

## 2015-11-28 ENCOUNTER — Ambulatory Visit (INDEPENDENT_AMBULATORY_CARE_PROVIDER_SITE_OTHER): Payer: Medicare Other | Admitting: Cardiovascular Disease

## 2015-11-28 ENCOUNTER — Encounter: Payer: Self-pay | Admitting: Cardiovascular Disease

## 2015-11-28 VITALS — BP 170/72 | HR 56 | Ht 65.0 in | Wt 146.2 lb

## 2015-11-28 DIAGNOSIS — I4891 Unspecified atrial fibrillation: Secondary | ICD-10-CM | POA: Diagnosis not present

## 2015-11-28 DIAGNOSIS — R002 Palpitations: Secondary | ICD-10-CM | POA: Diagnosis not present

## 2015-11-28 DIAGNOSIS — I480 Paroxysmal atrial fibrillation: Secondary | ICD-10-CM

## 2015-11-28 DIAGNOSIS — E876 Hypokalemia: Secondary | ICD-10-CM | POA: Diagnosis not present

## 2015-11-28 DIAGNOSIS — I10 Essential (primary) hypertension: Secondary | ICD-10-CM

## 2015-11-28 LAB — BASIC METABOLIC PANEL
BUN: 11 mg/dL (ref 7–25)
CO2: 31 mmol/L (ref 20–31)
Calcium: 9.8 mg/dL (ref 8.6–10.4)
Chloride: 97 mmol/L — ABNORMAL LOW (ref 98–110)
Creat: 0.54 mg/dL — ABNORMAL LOW (ref 0.60–0.93)
Glucose, Bld: 90 mg/dL (ref 65–99)
Potassium: 4 mmol/L (ref 3.5–5.3)
Sodium: 138 mmol/L (ref 135–146)

## 2015-11-28 NOTE — Patient Instructions (Signed)
Medication Instructions:   NO CHANGE  Labwork:  Your physician recommends that you HAVE LAB WORK TODAY  Follow-Up:  Your physician wants you to follow-up in: [place] You will receive a reminder letter in the mail two months in advance. If you don't receive a letter, please call our office to schedule the follow-up appointment.

## 2015-12-06 ENCOUNTER — Other Ambulatory Visit: Payer: Self-pay

## 2015-12-06 ENCOUNTER — Ambulatory Visit: Payer: Medicare Other | Admitting: Cardiovascular Disease

## 2015-12-06 MED ORDER — DILTIAZEM HCL ER COATED BEADS 180 MG PO CP24: 180.0000 mg | ORAL_CAPSULE | Freq: Every day | ORAL | Status: DC

## 2015-12-30 ENCOUNTER — Telehealth: Payer: Self-pay | Admitting: Cardiovascular Disease

## 2015-12-30 NOTE — Telephone Encounter (Signed)
-----   Message from Skeet Latch, MD sent at 12/30/2015 10:20 AM EDT ----- Rare episodes of atrial fibrillation. It is probably not worth starting an antiarrhythmic unless this is really bothering her.

## 2015-12-30 NOTE — Telephone Encounter (Signed)
Returned call to patient she stated she was returning a call to [place].Message sent to [facility].

## 2015-12-30 NOTE — Telephone Encounter (Signed)
Advised patient and she will continue current medications

## 2015-12-30 NOTE — Telephone Encounter (Signed)
New message  Returning the nurses call. Would not say what it was concerning.

## 2016-01-10 ENCOUNTER — Other Ambulatory Visit: Payer: Self-pay | Admitting: *Deleted

## 2016-01-10 MED ORDER — APIXABAN 5 MG PO TABS: 5.0000 mg | ORAL_TABLET | Freq: Two times a day (BID) | ORAL | 3 refills | Status: DC

## 2016-03-28 ENCOUNTER — Encounter: Payer: Self-pay | Admitting: Cardiovascular Disease

## 2016-03-28 ENCOUNTER — Ambulatory Visit (INDEPENDENT_AMBULATORY_CARE_PROVIDER_SITE_OTHER): Payer: Medicare Other | Admitting: Cardiovascular Disease

## 2016-03-28 VITALS — BP 162/74 | HR 67 | Ht 65.0 in | Wt 145.8 lb

## 2016-03-28 DIAGNOSIS — I480 Paroxysmal atrial fibrillation: Secondary | ICD-10-CM

## 2016-03-28 DIAGNOSIS — I10 Essential (primary) hypertension: Secondary | ICD-10-CM | POA: Diagnosis not present

## 2016-03-28 NOTE — Progress Notes (Signed)
Cardiology Office Note   Date:  03/28/2016   ID:  Robin [name] 1935/05/02, MRN [id number]  PCP:  Robin Stalker, PA-C  Cardiologist:   Skeet Latch, MD   Chief Complaint  Patient presents with  . Follow-up    pt states some SOB every now and then     `  ` History of Present Illness: Robin [name] is a 81 y.o. female with hypertension, paroxysmal atrial fibrillation, and hyperlipidemia and mild carotid stenosis who presents for follow-up.  Robin [name] is a former patient of Dr. Mare Ferrari. She had a heart cath in 2009 that she reports was normal.  Carotid Doppler 08/2013 revealed <50% stenosis bilaterally.  She checks her BP at home and it is typically well-controlled. She reported recurrent palpitations and was instructed to take an extra tablet of metoprolol as needed.  At her last appointment on 09/2015 she continued to have palpitations.  She wore a 30 day event monitor that showed occasional short episodes of atrial fibrillation.    Since her last appointment Ms. Sink has been feeling well.  When she has episodes of atrial fibrillation she gets dizzy.  It happens three or four times per week and lasts for approximately 30 minutes.  It usually gets better when she relaxes.  It also makes her nervous.  She takes a lorazepam if it is really bad and this seems to help. She notes that her blood pressure is typically well-controlled.  It was [id number] mmHg systolic before coming to clinic today.  She denies chest pain or shortness of breath.  She has Lower extremity edema very rarely when she is standing for prolonged periods of time. She denies orthopnea or PND.  She notes occasional episodes of shortness of breath. She continues to walking the park with her dog daily.  She has noticed that she is less short of breath when walking up an incline lately.   Past Medical History:  Diagnosis Date  . Anxiety   . AV bloc first degree   . Hyperlipidemia   . Hypertension   . Lumbar pain     fell off of her horse and has low back pain at times  . Venous insufficiency of both lower extremities   . Vertigo     Past Surgical History:  Procedure Laterality Date  . ABDOMINAL HYSTERECTOMY    . BREAST LUMPECTOMY WITH RADIOACTIVE SEED LOCALIZATION Left 05/05/2015   Procedure: LEFT BREAST LUMPECTOMY WITH RADIOACTIVE SEED LOCALIZATION;  Surgeon: Autumn Messing III, MD;  Location: [facility];  Service: General;  Laterality: Left;  . CHOLECYSTECTOMY    . EYE SURGERY     bil cataract surgery  . LUNG SURGERY       Current Outpatient Prescriptions  Medication Sig Dispense Refill  . apixaban (ELIQUIS) 5 MG TABS tablet Take 1 tablet (5 mg total) by mouth 2 (two) times daily. 180 tablet 3  . BILBERRY-BIOFLAV-RUTIN-QUERCET PO Take 1,000 mg by mouth.    Marland Kitchen CALCIUM PO Take 1 tablet by mouth daily.     . Cholecalciferol (VITAMIN D PO) Take 2,400 mg by mouth daily.      Marland Kitchen diltiazem (CARDIZEM CD) 180 MG 24 hr capsule Take 1 capsule (180 mg total) by mouth daily. 30 capsule 5  . diphenoxylate-atropine (LOMOTIL) 2.5-0.025 MG per tablet Take 1 tablet by mouth daily as needed (for diarrhea).     . estradiol (ESTRACE) 1 MG tablet Take 1 mg by mouth daily.    Marland Kitchen  Lactobacillus Rhamnosus, GG, (CULTURELLE PO) Take 1 capsule by mouth every morning.    Marland Kitchen LORazepam (ATIVAN) 0.5 MG tablet Take 0.5 mg by mouth 2 (two) times daily as needed for anxiety.    . Lutein 20 MG CAPS Take 1 capsule by mouth daily.     . metoprolol (TOPROL-XL) 100 MG 24 hr tablet TAKE 1/2 TABLET BY MOUTH IN THE MORNING AND 1 TABLET IN THE EVENING BY MOUTH DAILY    . Multiple Vitamins-Minerals (HAIR SKIN NAILS PO) Take 1 capsule by mouth daily.    . potassium chloride SA (K-DUR,KLOR-CON) 20 MEQ tablet Take 20 mEq by mouth daily.    Marland Kitchen triamcinolone (NASACORT ALLERGY 24HR) 55 MCG/ACT AERO nasal inhaler Place 2 sprays into the nose daily.    Marland Kitchen triamcinolone cream (KENALOG) 0.1 % Apply 1 application topically daily as needed  (for rash).     . Triamterene-HCTZ (DYAZIDE PO) Take 25 mg by mouth daily.      . vitamin A 10000 UNIT capsule Take 10,000 Units by mouth daily.     No current facility-administered medications for this visit.     Allergies:   Iodinated diagnostic agents; Amlodipine; Codeine; Diphenhydramine; Diphenhydramine hcl; Pravastatin; Sulfa antibiotics; and Sulfa drugs cross reactors    Social History:  The patient  reports that she has never smoked. She does not have any smokeless tobacco history on file. She reports that she drinks alcohol. She reports that she does not use drugs.   Family History:  The patient's family history includes Cancer in her sister; Heart disease in her father and mother; Hypertension in her brother, mother, sister, sister, and sister; Stroke in her mother.    ROS:  Please see the history of present illness.   Otherwise, review of systems are positive for none.   All other systems are reviewed and negative.    PHYSICAL EXAM: VS:  BP (!) 162/74   Pulse 67   Ht 5\' 5"  (1.651 m)   Wt 145 lb 12.8 oz (66.1 kg)   BMI 24.26 kg/m  , BMI Body mass index is 24.26 kg/m. GENERAL:  Well appearing HEENT:  Pupils equal round and reactive, fundi not visualized, oral mucosa unremarkable NECK:  No jugular venous distention, waveform within normal limits, carotid upstroke brisk and symmetric, no bruits LYMPHATICS:  No cervical adenopathy LUNGS:  Clear to auscultation bilaterally HEART:  RRR.  PMI not displaced or sustained,S1 and S2 within normal limits, no S3, no S4, no clicks, no rubs, no murmurs ABD:  Flat, positive bowel sounds normal in frequency in pitch, no bruits, no rebound, no guarding, no midline pulsatile mass, no hepatomegaly, no splenomegaly EXT:  2 plus pulses throughout, no edema, no cyanosis no clubbing SKIN:  No rashes no nodules NEURO:  Cranial nerves II through XII grossly intact, motor grossly intact throughout PSYCH:  Cognitively intact, oriented to person  place and time   EKG:  EKG is ordered today. The ekg ordered today demonstrates sinus rhythm rate 68 bpm.  First degree AV block.   30 day Event Monitor 11/08/15:  Quality: Fair.  Baseline artifact. Predominant rhythm: sinus rhythm at atrial fibrillation both noted Sinus rhythm: Average heart rate: 73 bpm Max heart rate: 123 bpm Min heart rate: 46 bpm  Atrial fibrillation: Rates 58-122 bpm.   <1% atrial fibrillation burden.  Recent Labs: 08/05/2015: Hemoglobin 14.8; Platelets 292 08/19/2015: ALT 15; TSH 3.17 11/28/2015: BUN 11; Creat 0.54; Potassium 4.0; Sodium 138    Lipid Panel  Component Value Date/Time   CHOL 213 (H) 06/14/2011 0832   TRIG 85.0 06/14/2011 0832   HDL 32.60 (L) 06/14/2011 0832   CHOLHDL 7 06/14/2011 0832   VLDL 17.0 06/14/2011 0832   LDLDIRECT 161.5 06/14/2011 0832      Wt Readings from Last 3 Encounters:  03/28/16 145 lb 12.8 oz (66.1 kg)  11/28/15 146 lb 3.2 oz (66.3 kg)  09/22/15 145 lb (65.8 kg)      ASSESSMENT AND PLAN:  # Paroxysmal atrial fibrillation/flutter # PACs: Monitor showed <1% atrial fibrillation burden.  She continues to have palpitations but isn't interested in starting an antiarrhythmic.   Continue Eliquis, diltiazem and metoprolol.  # Hypertension: BP  Is above goal today.  She brings a log of her home blood pressures that it has been controlled at home.    Continue metoprolol, HCTZ/triamterene and diltiazem.    Current medicines are reviewed at length with the patient today.  The patient does not have concerns regarding medicines.  The following changes have been made:  no change  Labs/ tests ordered today include:   Orders Placed This Encounter  Procedures  . EKG 12-Lead      Disposition:   FU with Torianna Junio C. Oval Linsey, MD, [facility] in 4 months.     This note was written with the assistance of speech recognition software.  Please excuse any transcriptional errors.  Signed, Allante Whitmire C. Oval Linsey, MD, [facility]    03/28/2016 10:39 PM    [place]

## 2016-03-28 NOTE — Patient Instructions (Signed)
Medication Instructions:  Your physician recommends that you continue on your current medications as directed. Please refer to the Current Medication list given to you today.  Labwork: none  Testing/Procedures: none  Follow-Up: Your physician recommends that you schedule a follow-up appointment in: 4 month ov   If you need a refill on your cardiac medications before your next appointment, please call your pharmacy.  

## 2016-06-03 ENCOUNTER — Other Ambulatory Visit: Payer: Self-pay | Admitting: Cardiovascular Disease

## 2016-06-04 NOTE — Telephone Encounter (Signed)
Review for refill. 

## 2016-07-30 ENCOUNTER — Ambulatory Visit (INDEPENDENT_AMBULATORY_CARE_PROVIDER_SITE_OTHER): Payer: Medicare Other | Admitting: Cardiovascular Disease

## 2016-07-30 ENCOUNTER — Encounter: Payer: Self-pay | Admitting: Cardiovascular Disease

## 2016-07-30 VITALS — BP 160/78 | HR 68 | Ht 65.0 in | Wt 145.8 lb

## 2016-07-30 DIAGNOSIS — I10 Essential (primary) hypertension: Secondary | ICD-10-CM | POA: Diagnosis not present

## 2016-07-30 DIAGNOSIS — I480 Paroxysmal atrial fibrillation: Secondary | ICD-10-CM | POA: Diagnosis not present

## 2016-07-30 DIAGNOSIS — I491 Atrial premature depolarization: Secondary | ICD-10-CM

## 2016-07-30 NOTE — Patient Instructions (Signed)
Medication Instructions:  Your physician recommends that you continue on your current medications as directed. Please refer to the Current Medication list given to you today.  Labwork: NONE  Testing/Procedures: NONE  Follow-Up: Your physician recommends that you schedule a follow-up appointment in: [place]  Your physician recommends that you schedule a follow-up appointment in: 4 MONTHS WITH DR Ellport ON BLOOD PRESSURE AND BRING WITH YOU TO ONE MONTH FOLLOW UP WITH PHARMACIST  If you need a refill on your cardiac medications before your next appointment, please call your pharmacy.

## 2016-07-30 NOTE — Progress Notes (Signed)
Cardiology Office Note   Date:  07/30/2016   ID:  Aymar E. Meredeth Ide 1934-09-06, MRN [id number]  PCP:  [name], PA-C  Cardiologist:   Skeet Latch, MD   Chief Complaint  Patient presents with  . Follow-up    Pt states no SX.    `  ` History of Present Illness: Robin [name] is a 82 y.o. female with hypertension, paroxysmal atrial fibrillation, and hyperlipidemia and mild carotid stenosis who presents for follow-up.  Robin [name] is a former patient of Dr. Mare Ferrari. She had a heart cath in 2009 that she reports was normal.  Carotid Doppler 08/2013 revealed <50% stenosis bilaterally.  She checks her BP at home and it is typically well-controlled. She reported recurrent palpitations and was instructed to take an extra tablet of metoprolol as needed.  At her last appointment on 09/2015 she continued to have palpitations.  She wore a 30 day event monitor that showed occasional short episodes of atrial fibrillation.    Since her last appointment Ms. Sink has been feeling well.  She reports two episodes of atrial fibrillation lasting approximately 45 minutes each.  She took an extra metoprolol and laid down, which helped.  She notes that her blood pressure at home has been running mostly in the [id number] systolic. Her breathing has been good. She denies any chest pain, shortness of breath, lower extremity edema, orthopnea, or PND.  She is having sclerotherapy at [facility].    Past Medical History:  Diagnosis Date  . Anxiety   . AV bloc first degree   . Hyperlipidemia   . Hypertension   . Lumbar pain    fell off of her horse and has low back pain at times  . Venous insufficiency of both lower extremities   . Vertigo     Past Surgical History:  Procedure Laterality Date  . ABDOMINAL HYSTERECTOMY    . BREAST LUMPECTOMY WITH RADIOACTIVE SEED LOCALIZATION Left 05/05/2015   Procedure: LEFT BREAST LUMPECTOMY WITH RADIOACTIVE SEED LOCALIZATION;  Surgeon: Autumn Messing III, MD;  Location: [facility];  Service: General;  Laterality: Left;  . CHOLECYSTECTOMY    . EYE SURGERY     bil cataract surgery  . LUNG SURGERY       Current Outpatient Prescriptions  Medication Sig Dispense Refill  . apixaban (ELIQUIS) 5 MG TABS tablet Take 1 tablet (5 mg total) by mouth 2 (two) times daily. 180 tablet 3  . BILBERRY-BIOFLAV-RUTIN-QUERCET PO Take 1,000 mg by mouth.    Marland Kitchen CALCIUM PO Take 1 tablet by mouth daily.     . Cholecalciferol (VITAMIN D PO) Take 2,400 mg by mouth daily.      Marland Kitchen diltiazem (CARDIZEM CD) 180 MG 24 hr capsule Take 1 capsule (180 mg total) by mouth daily. 30 capsule 5  . diphenoxylate-atropine (LOMOTIL) 2.5-0.025 MG per tablet Take 1 tablet by mouth daily as needed (for diarrhea).     . estradiol (ESTRACE) 1 MG tablet Take 1 mg by mouth daily.    . Lactobacillus Rhamnosus, GG, (CULTURELLE PO) Take 1 capsule by mouth every morning.    Marland Kitchen LORazepam (ATIVAN) 0.5 MG tablet Take 0.5 mg by mouth 2 (two) times daily as needed for anxiety.    . Lutein 20 MG CAPS Take 1 capsule by mouth daily.     . metoprolol (TOPROL-XL) 100 MG 24 hr tablet TAKE 1/2 TABLET BY MOUTH IN THE MORNING AND 1 TABLET IN THE EVENING  BY MOUTH DAILY    . Multiple Vitamins-Minerals (HAIR SKIN NAILS PO) Take 1 capsule by mouth daily.    . potassium chloride SA (K-DUR,KLOR-CON) 20 MEQ tablet Take 20 mEq by mouth daily.    Marland Kitchen triamcinolone (NASACORT ALLERGY 24HR) 55 MCG/ACT AERO nasal inhaler Place 2 sprays into the nose daily.    Marland Kitchen triamcinolone cream (KENALOG) 0.1 % Apply 1 application topically daily as needed (for rash).     . Triamterene-HCTZ (DYAZIDE PO) Take 25 mg by mouth daily.      . vitamin A 10000 UNIT capsule Take 10,000 Units by mouth daily.     No current facility-administered medications for this visit.     Allergies:   Iodinated diagnostic agents; Amlodipine; Codeine; Diphenhydramine; Diphenhydramine hcl; Pravastatin; Sulfa antibiotics; and Sulfa drugs  cross reactors    Social History:  The patient  reports that she has never smoked. She has never used smokeless tobacco. She reports that she drinks alcohol. She reports that she does not use drugs.   Family History:  The patient's family history includes Cancer in her sister; Heart disease in her father and mother; Hypertension in her brother, mother, sister, sister, and sister; Stroke in her mother.    ROS:  Please see the history of present illness.   Otherwise, review of systems are positive for back pain.   All other systems are reviewed and negative.    PHYSICAL EXAM: VS:  BP (!) 160/78   Pulse 68   Ht 5\' 5"  (1.651 m)   Wt 66.1 kg (145 lb 12.8 oz)   BMI 24.26 kg/m  , BMI Body mass index is 24.26 kg/m. GENERAL:  Well appearing HEENT:  Pupils equal round and reactive, fundi not visualized, oral mucosa unremarkable NECK:  No jugular venous distention, waveform within normal limits, carotid upstroke brisk and symmetric, no bruits LYMPHATICS:  No cervical adenopathy LUNGS:  Clear to auscultation bilaterally HEART:  RRR.  PMI not displaced or sustained,S1 and S2 within normal limits, no S3, no S4, no clicks, no rubs, no murmurs ABD:  Flat, positive bowel sounds normal in frequency in pitch, no bruits, no rebound, no guarding, no midline pulsatile mass, no hepatomegaly, no splenomegaly EXT:  2 plus pulses throughout, no edema, no cyanosis no clubbing SKIN:  No rashes no nodules NEURO:  Cranial nerves II through XII grossly intact, motor grossly intact throughout PSYCH:  Cognitively intact, oriented to person place and time   EKG:  EKG is ordered today. The ekg ordered today demonstrates sinus rhythm rate 68 bpm.  First degree AV block.   30 day Event Monitor 11/08/15:  Quality: Fair.  Baseline artifact. Predominant rhythm: sinus rhythm at atrial fibrillation both noted Sinus rhythm: Average heart rate: 73 bpm Max heart rate: 123 bpm Min heart rate: 46 bpm  Atrial  fibrillation: Rates 58-122 bpm.   <1% atrial fibrillation burden.  Recent Labs: 08/05/2015: Hemoglobin 14.8; Platelets 292 08/19/2015: ALT 15; TSH 3.17 11/28/2015: BUN 11; Creat 0.54; Potassium 4.0; Sodium 138    Lipid Panel    Component Value Date/Time   CHOL 213 (H) 06/14/2011 0832   TRIG 85.0 06/14/2011 0832   HDL 32.60 (L) 06/14/2011 0832   CHOLHDL 7 06/14/2011 0832   VLDL 17.0 06/14/2011 0832   LDLDIRECT 161.5 06/14/2011 0832      Wt Readings from Last 3 Encounters:  07/30/16 66.1 kg (145 lb 12.8 oz)  03/28/16 66.1 kg (145 lb 12.8 oz)  11/28/15 66.3 kg (146 lb 3.2  oz)      ASSESSMENT AND PLAN:  # Paroxysmal atrial fibrillation/flutter # PACs: Monitor showed <1% atrial fibrillation burden.  Two episodes of atrial fibrillation since her last appointment. She is not interested in antiarrhythmics and feels metoprolol is controlling her symptoms well. Continue Eliquis, diltiazem and metoprolol.  # Hypertension: BP  Is above goal today.  I have asked her to bring her BP log.  We discussed the new guidelines of less than 130/80. She is reluctant to start any additional medications at this time. Continue metoprolol, HCTZ/triamterene and diltiazem.   # CV Prevention: Lipids checked with PCP.  Within normal limits per patient.  Current medicines are reviewed at length with the patient today.  The patient does not have concerns regarding medicines.  The following changes have been made:  no change  Labs/ tests ordered today include:   No orders of the defined types were placed in this encounter.     Disposition:   FU with Kardell Virgil C. Oval Linsey, MD, [facility] in 4 months.  Pharmacy in 1 month   This note was written with the assistance of speech recognition software.  Please excuse any transcriptional errors.  Signed, Latana Colin C. Oval Linsey, MD, [facility]  07/30/2016 3:00 PM    [place]

## 2016-08-21 ENCOUNTER — Other Ambulatory Visit: Payer: Self-pay | Admitting: General Surgery

## 2016-08-21 DIAGNOSIS — Z1231 Encounter for screening mammogram for malignant neoplasm of breast: Secondary | ICD-10-CM

## 2016-08-24 ENCOUNTER — Other Ambulatory Visit: Payer: Self-pay | Admitting: General Surgery

## 2016-08-24 DIAGNOSIS — N6022 Fibroadenosis of left breast: Secondary | ICD-10-CM

## 2016-08-24 DIAGNOSIS — N644 Mastodynia: Secondary | ICD-10-CM

## 2016-08-28 ENCOUNTER — Ambulatory Visit (INDEPENDENT_AMBULATORY_CARE_PROVIDER_SITE_OTHER): Payer: Medicare Other | Admitting: Pharmacist Clinician (PhC)/ Clinical Pharmacy Specialist

## 2016-08-28 DIAGNOSIS — I119 Hypertensive heart disease without heart failure: Secondary | ICD-10-CM | POA: Diagnosis not present

## 2016-08-28 NOTE — Patient Instructions (Signed)
Return for a a follow up appointment in 3 months  Your blood pressure today is 162/78  (goal is < 140/90)  Check your blood pressure at home several times each week and keep record of the readings.  Take your BP meds as follows:  Continue with your current medications  Bring all of your meds, your BP cuff and your record of home blood pressures to your next appointment.  Exercise as you're able, try to walk approximately 30 minutes per day.  Keep salt intake to a minimum, especially watch canned and prepared boxed foods.  Eat more fresh fruits and vegetables and fewer canned items.  Avoid eating in fast food restaurants.    HOW TO TAKE YOUR BLOOD PRESSURE: . Rest 5 minutes before taking your blood pressure. .  Don't smoke or drink caffeinated beverages for at least 30 minutes before. . Take your blood pressure before (not after) you eat. . Sit comfortably with your back supported and both feet on the floor (don't cross your legs). . Elevate your arm to heart level on a table or a desk. . Use the proper sized cuff. It should fit smoothly and snugly around your bare upper arm. There should be enough room to slip a fingertip under the cuff. The bottom edge of the cuff should be 1 inch above the crease of the elbow. . Ideally, take 3 measurements at one sitting and record the average.

## 2016-08-28 NOTE — Assessment & Plan Note (Signed)
Patient with some element of white coat hypertension, her home cuff read more than 10 points higher than her recorded home readings.  It did read lower than the office readings, but with her home average of 28 readings at 129/70 I am reluctant to add to her medications.  She is doing well with her current regimen and I have asked that she continue as is.  She should continue with regular home BP checks and we will follow up with her in 3 months.   She knows to call the office should her home readings trend to > 915 systolic on a regular basis.

## 2016-08-28 NOTE — Progress Notes (Signed)
08/28/2016 Robin [name] 21-Aug-1934 [id number]   HPI:  Robin [name] is a 82 y.o. female patient of Dr Oval Linsey, with a [place] below who presents today for hypertension clinic evaluation.  Robin [name] has atrial fibrillation, hyperlipidemia and mild carotid stenosis.  She is aware of her AF episodes, and feels that they are not as frequent since increasing her metoprolol to 150 mg daily.  She does not like the idea of taking the whole 150 mg at once, so she divides her dose into morning, supper and bedtime (she was originally told to take 50 mg am and 100 mg pm).  She admits to some problems with anxiety, as well as some GI problems that lead to diarrhea.   For this she uses Lomotil, but states that 30 tablets will usually last her about 3 months.    Blood Pressure Goal:  130/80  Current Medications:  Diltiazem 180 mg qd (after lunch)  Metoprolol succ 50 mg (tid)  Triam/hctz 37.5/25 qd (breakfast) (green tablet)  Family Hx:  Mother died from CHF (died at 62), father died heart disease, DM, multiple MI (died at 44)  1 sister with leukemia (51), 1 sister cancer (3)  1 daughter, no hypertension or heart disease  Social Hx:  No tobacco, quit more than 30 years ago; some alcohol, maybe 2-3 glasses of wine per week; coffee 2 cups in am, some Coke, states 2 L bottle will usually last about 3 days  Diet:  Eats out regularly, no added salt; can tell when she eats high sodium (holds fluid in legs), rare fried foods  Exercise:  Walks dog daily, has FitBit, averages 5,000 steps per day, sometimes as much as 8,000  Home BP readings:  Omron cuff- about 80 year old; average of 28 home readings was 129/70, with a range of 111-145/55-80.  Home cuff read about 10-15 points lower than office reading    Intolerances:    Amlodipine caused shortness of breath  Wt Readings from Last 3 Encounters:  07/30/16 145 lb 12.8 oz (66.1 kg)  03/28/16 145 lb 12.8 oz (66.1 kg)  11/28/15 146 lb 3.2 oz (66.3  kg)   BP Readings from Last 3 Encounters:  08/28/16 (!) 162/78  07/30/16 (!) 160/78  03/28/16 (!) 162/74   Pulse Readings from Last 3 Encounters:  08/28/16 64  07/30/16 68  03/28/16 67    Current Outpatient Prescriptions  Medication Sig Dispense Refill  . apixaban (ELIQUIS) 5 MG TABS tablet Take 1 tablet (5 mg total) by mouth 2 (two) times daily. 180 tablet 3  . BILBERRY-BIOFLAV-RUTIN-QUERCET PO Take 1,000 mg by mouth.    Marland Kitchen CALCIUM PO Take 1 tablet by mouth daily.     . Cholecalciferol (VITAMIN D PO) Take 2,400 mg by mouth daily.      Marland Kitchen diltiazem (CARDIZEM CD) 180 MG 24 hr capsule Take 1 capsule (180 mg total) by mouth daily. 30 capsule 5  . diphenoxylate-atropine (LOMOTIL) 2.5-0.025 MG per tablet Take 1 tablet by mouth daily as needed (for diarrhea).     . estradiol (ESTRACE) 1 MG tablet Take 1 mg by mouth daily.    . Lactobacillus Rhamnosus, GG, (CULTURELLE PO) Take 1 capsule by mouth every morning.    Marland Kitchen LORazepam (ATIVAN) 0.5 MG tablet Take 0.5 mg by mouth 2 (two) times daily as needed for anxiety.    . Lutein 20 MG CAPS Take 1 capsule by mouth daily.     . metoprolol (TOPROL-XL) 100  MG 24 hr tablet TAKE 1/2 TABLET BY MOUTH IN THE MORNING AND 1 TABLET IN THE EVENING BY MOUTH DAILY    . Multiple Vitamins-Minerals (HAIR SKIN NAILS PO) Take 1 capsule by mouth daily.    . potassium chloride SA (K-DUR,KLOR-CON) 20 MEQ tablet Take 20 mEq by mouth daily.    Marland Kitchen triamcinolone (NASACORT ALLERGY 24HR) 55 MCG/ACT AERO nasal inhaler Place 2 sprays into the nose daily.    Marland Kitchen triamcinolone cream (KENALOG) 0.1 % Apply 1 application topically daily as needed (for rash).     . Triamterene-HCTZ (DYAZIDE PO) Take 25 mg by mouth daily.      . vitamin A 10000 UNIT capsule Take 10,000 Units by mouth daily.     No current facility-administered medications for this visit.     Allergies  Allergen Reactions  . Iodinated Diagnostic Agents Shortness Of Breath  . Amlodipine     Shortness of breath  .  Codeine Nausea Only  . Diphenhydramine Other (See Comments)    hypertension  . Diphenhydramine Hcl     Hypertension   . Pravastatin     Muscle aches  . Sulfa Antibiotics Rash  . Sulfa Drugs Cross Reactors Rash    Past Medical History:  Diagnosis Date  . Anxiety   . AV bloc first degree   . Hyperlipidemia   . Hypertension   . Lumbar pain    fell off of her horse and has low back pain at times  . Venous insufficiency of both lower extremities   . Vertigo     Blood pressure (!) 162/78, pulse 64.  Benign hypertensive heart disease without heart failure Patient with some element of white coat hypertension, her home cuff read more than 10 points higher than her recorded home readings.  It did read lower than the office readings, but with her home average of 28 readings at 129/70 I am reluctant to add to her medications.  She is doing well with her current regimen and I have asked that she continue as is.  She should continue with regular home BP checks and we will follow up with her in 3 months.   She knows to call the office should her home readings trend to > 982 systolic on a regular basis.    Tommy Medal PharmD CPP [facility]

## 2016-08-30 ENCOUNTER — Other Ambulatory Visit: Payer: Self-pay | Admitting: Cardiovascular Disease

## 2016-08-30 NOTE — Telephone Encounter (Signed)
Rx(s) sent to pharmacy electronically.  

## 2016-08-30 NOTE — Telephone Encounter (Signed)
Please review for refill. Thanks!  

## 2016-09-04 ENCOUNTER — Other Ambulatory Visit: Payer: Self-pay | Admitting: Cardiovascular Disease

## 2016-09-05 ENCOUNTER — Other Ambulatory Visit: Payer: Self-pay

## 2016-09-05 NOTE — Telephone Encounter (Signed)
Refill Request.  

## 2016-09-10 ENCOUNTER — Ambulatory Visit
Admission: RE | Admit: 2016-09-10 | Discharge: 2016-09-10 | Disposition: A | Discharge status: Home / Self Care | Payer: Medicare Other | Source: Ambulatory Visit | Attending: General Surgery | Admitting: General Surgery

## 2016-09-10 DIAGNOSIS — N6022 Fibroadenosis of left breast: Secondary | ICD-10-CM

## 2016-09-10 DIAGNOSIS — N644 Mastodynia: Secondary | ICD-10-CM

## 2016-11-27 ENCOUNTER — Ambulatory Visit (INDEPENDENT_AMBULATORY_CARE_PROVIDER_SITE_OTHER): Payer: Medicare Other | Admitting: Cardiovascular Disease

## 2016-11-27 ENCOUNTER — Encounter: Payer: Self-pay | Admitting: Cardiovascular Disease

## 2016-11-27 VITALS — BP 165/72 | HR 68 | Ht 65.0 in | Wt 143.6 lb

## 2016-11-27 DIAGNOSIS — Z79899 Other long term (current) drug therapy: Secondary | ICD-10-CM | POA: Diagnosis not present

## 2016-11-27 DIAGNOSIS — I480 Paroxysmal atrial fibrillation: Secondary | ICD-10-CM | POA: Diagnosis not present

## 2016-11-27 DIAGNOSIS — I119 Hypertensive heart disease without heart failure: Secondary | ICD-10-CM | POA: Diagnosis not present

## 2016-11-27 NOTE — Progress Notes (Signed)
Cardiology Office Note   Date:  11/27/2016   ID:  Robin [name] February 20, 1935, MRN [id number]  PCP:  [name], PA-C  Cardiologist:   Skeet Latch, MD   No chief complaint on file.    History of Present Illness: Robin [name] is a 82 y.o. female with hypertension, paroxysmal atrial fibrillation, and hyperlipidemia and mild carotid stenosis who presents for follow-up.  Robin [name] is a former patient of Dr. Mare Ferrari. She had a heart cath in 2009 that she reports was normal.  Carotid Doppler 08/2013 revealed <50% stenosis bilaterally. She reported recurrent palpitations and wore a 30 day event monitor 10/2015 that showed occasional short episodes of atrial fibrillation.  She has been managed on Eliquis, diltiazem and metoprolol. She denies any recent episodes of atrial fibrillation. She has been checking her blood pressure at home and it typically had been running less than 8:54 systolic. However for the last week she's been treated with antibiotics for an infection on her left arm. During that time her blood pressure has been in the 140s. She saw our pharmacist 08/2016 and her average blood pressure was 129/70.  She notes some mild shortness of breath when walking up stairs or up hills. She denies chest pain, lower extremity edema, orthopnea, or PND. Her dogs daily and has no exertional symptoms and was going uphill.   Past Medical History:  Diagnosis Date  . Anxiety   . AV bloc first degree   . Hyperlipidemia   . Hypertension   . Lumbar pain    fell off of her horse and has low back pain at times  . Venous insufficiency of both lower extremities   . Vertigo     Past Surgical History:  Procedure Laterality Date  . ABDOMINAL HYSTERECTOMY    . BREAST BIOPSY Left   . BREAST EXCISIONAL BIOPSY Right   . BREAST LUMPECTOMY WITH RADIOACTIVE SEED LOCALIZATION Left 05/05/2015   Procedure: LEFT BREAST LUMPECTOMY WITH RADIOACTIVE SEED LOCALIZATION;  Surgeon: Autumn Messing III,  MD;  Location: [facility];  Service: General;  Laterality: Left;  . CHOLECYSTECTOMY    . EYE SURGERY     bil cataract surgery  . LUNG SURGERY       Current Outpatient Prescriptions  Medication Sig Dispense Refill  . apixaban (ELIQUIS) 5 MG TABS tablet Take 1 tablet (5 mg total) by mouth 2 (two) times daily. 180 tablet 3  . BILBERRY-BIOFLAV-RUTIN-QUERCET PO Take 1,000 mg by mouth.    Marland Kitchen CALCIUM PO Take 1 tablet by mouth daily.     . Cholecalciferol (VITAMIN D PO) Take 2,400 mg by mouth daily.      Marland Kitchen diltiazem (CARDIZEM CD) 180 MG 24 hr capsule Take 1 capsule (180 mg total) by mouth daily. 30 capsule 5  . diphenoxylate-atropine (LOMOTIL) 2.5-0.025 MG per tablet Take 1 tablet by mouth daily as needed (for diarrhea).     . estradiol (ESTRACE) 1 MG tablet Take 1 mg by mouth daily.    . Lactobacillus Rhamnosus, GG, (CULTURELLE PO) Take 1 capsule by mouth every morning.    Marland Kitchen LORazepam (ATIVAN) 0.5 MG tablet Take 0.5 mg by mouth 2 (two) times daily as needed for anxiety.    . Lutein 20 MG CAPS Take 1 capsule by mouth daily.     . metoprolol (TOPROL-XL) 100 MG 24 hr tablet TAKE 1/2 TABLET BY MOUTH IN THE MORNING AND 1 TABLET IN THE EVENING BY MOUTH DAILY    .  Multiple Vitamins-Minerals (HAIR SKIN NAILS PO) Take 1 capsule by mouth daily.    . potassium chloride SA (K-DUR,KLOR-CON) 20 MEQ tablet TAKE 1 TABLET TWICE DAILY. 180 tablet 11  . triamcinolone (NASACORT ALLERGY 24HR) 55 MCG/ACT AERO nasal inhaler Place 2 sprays into the nose daily.    Marland Kitchen triamcinolone cream (KENALOG) 0.1 % Apply 1 application topically daily as needed (for rash).     . Triamterene-HCTZ (DYAZIDE PO) Take 25 mg by mouth daily.      . vitamin A 10000 UNIT capsule Take 10,000 Units by mouth daily.     No current facility-administered medications for this visit.     Allergies:   Iodinated diagnostic agents; Amlodipine; Codeine; Diphenhydramine; Diphenhydramine hcl; Pravastatin; Sulfa antibiotics; and Sulfa drugs  cross reactors    Social History:  The patient  reports that she has never smoked. She has never used smokeless tobacco. She reports that she drinks alcohol. She reports that she does not use drugs.   Family History:  The patient's family history includes Cancer in her sister; Heart disease in her father and mother; Hypertension in her brother, mother, sister, sister, and sister; Stroke in her mother.    ROS:  Please see the history of present illness.   Otherwise, review of systems are positive for back pain.   All other systems are reviewed and negative.    PHYSICAL EXAM: VS:  BP (!) 165/72   Pulse 68   Ht 5\' 5"  (1.651 m)   Wt 65.1 kg (143 lb 9.6 oz)   BMI 23.90 kg/m  , BMI Body mass index is 23.9 kg/m. GENERAL:  Well appearing.  No acute distress HEENT:  Pupils equal round and reactive, fundi not visualized, oral mucosa unremarkable NECK:  No jugular venous distention, waveform within normal limits, carotid upstroke brisk and symmetric, no bruits LYMPHATICS:  No cervical adenopathy LUNGS:  Clear to auscultation bilaterally.  No crackles, wheezes, or rhonchi. HEART:  RRR.  PMI not displaced or sustained,S1 and S2 within normal limits, no S3, no S4, no clicks, no rubs, no murmurs ABD:  Flat, positive bowel sounds normal in frequency in pitch, no bruits, no rebound, no guarding, no midline pulsatile mass, no hepatomegaly, no splenomegaly EXT:  2 plus pulses throughout, no edema, no cyanosis no clubbing SKIN:  No rashes no nodules NEURO:  Cranial nerves II through XII grossly intact, motor grossly intact throughout PSYCH:  Cognitively intact, oriented to person place and time   EKG:  EKG is ordered today. The ekg ordered 11/27/16 demonstrates sinus rhythm rate 68 bpm.  First degree AV block.   30 day Event Monitor 11/08/15:  Quality: Fair.  Baseline artifact. Predominant rhythm: sinus rhythm at atrial fibrillation both noted Sinus rhythm: Average heart rate: 73 bpm Max heart  rate: 123 bpm Min heart rate: 46 bpm  Atrial fibrillation: Rates 58-122 bpm.   <1% atrial fibrillation burden.  Recent Labs: 11/28/2015: BUN 11; Creat 0.54; Potassium 4.0; Sodium 138   07/30/16: Sodium 139, potassium 3.6, BUN 13, creatinine 0.69 AST 14, ALT 15 Total cholesterol 198, triglycerides 131, HDL 63, LDL 109   Lipid Panel    Component Value Date/Time   CHOL 213 (H) 06/14/2011 0832   TRIG 85.0 06/14/2011 0832   HDL 32.60 (L) 06/14/2011 0832   CHOLHDL 7 06/14/2011 0832   VLDL 17.0 06/14/2011 0832   LDLDIRECT 161.5 06/14/2011 0832      Wt Readings from Last 3 Encounters:  11/27/16 65.1 kg (143 lb 9.6  oz)  07/30/16 66.1 kg (145 lb 12.8 oz)  03/28/16 66.1 kg (145 lb 12.8 oz)      ASSESSMENT AND PLAN:  # Paroxysmal atrial fibrillation/flutter # PACs: Monitor showed <1% atrial fibrillation burden.  She hasn't had any recent episodes of atrial fibrillation.  Continue Eliquis, diltiazem and metoprolol.  Check BMP and CBC today.  # Hypertension: BP is above goal today. It is been well-controlled at home. She will keep a log of her blood pressures and follow up with our pharmacist in 1 month.  She attributes this to her blood pressure to her antibiotic and infection. Continue metoprolol, HCTZ/triamterene and diltiazem.   # CV Prevention: Lipids checked with PCP.     Current medicines are reviewed at length with the patient today.  The patient does not have concerns regarding medicines.  The following changes have been made:  no change  Labs/ tests ordered today include:   Orders Placed This Encounter  Procedures  . CBC with Differential/Platelet  . Basic metabolic panel      Disposition:   FU with Guinevere Stephenson C. Oval Linsey, MD, [facility] in 4 months.  Pharmacy in 1 month   This note was written with the assistance of speech recognition software.  Please excuse any transcriptional errors.  Signed, Karl Erway C. Oval Linsey, MD, [facility]  11/27/2016 1:45 PM    [facility]

## 2016-11-27 NOTE — Addendum Note (Signed)
Addended by: Alvina Filbert B on: 11/27/2016 05:42 PM   Modules accepted: Orders

## 2016-11-27 NOTE — Patient Instructions (Addendum)
Medication Instructions:  Your physician recommends that you continue on your current medications as directed. Please refer to the Current Medication list given to you today.  Labwork: BMET/CBC TODAY   Testing/Procedures: none  Follow-Up: Your physician recommends that you schedule a follow-up appointment in: [place] physician wants you to follow-up in: [place] will receive a reminder letter in the mail two months in advance. If you don't receive a letter, please call our office to schedule the follow-up appointment.  Any Other Special Instructions Will Be Listed Below (If Applicable). LOG YOUR BLOOD PRESSURE AND BRING WITH YOU TO YOUR PHARM D APPOINTMENT   If you need a refill on your cardiac medications before your next appointment, please call your pharmacy.

## 2016-11-28 LAB — CBC WITH DIFFERENTIAL/PLATELET
Basophils Absolute: 0 10*3/uL (ref 0.0–0.2)
Basos: 0 %
EOS (ABSOLUTE): 0.2 10*3/uL (ref 0.0–0.4)
Eos: 2 %
Hematocrit: 42.5 % (ref 34.0–46.6)
Hemoglobin: 14.3 g/dL (ref 11.1–15.9)
Immature Grans (Abs): 0 10*3/uL (ref 0.0–0.1)
Immature Granulocytes: 0 %
Lymphocytes Absolute: 2.3 10*3/uL (ref 0.7–3.1)
Lymphs: 28 %
MCH: 31 pg (ref 26.6–33.0)
MCHC: 33.6 g/dL (ref 31.5–35.7)
MCV: 92 fL (ref 79–97)
Monocytes Absolute: 0.6 10*3/uL (ref 0.1–0.9)
Monocytes: 7 %
Neutrophils Absolute: 5.2 10*3/uL (ref 1.4–7.0)
Neutrophils: 63 %
Platelets: 290 10*3/uL (ref 150–379)
RBC: 4.62 x10E6/uL (ref 3.77–5.28)
RDW: 12.9 % (ref 12.3–15.4)
WBC: 8.3 10*3/uL (ref 3.4–10.8)

## 2016-11-28 LAB — BASIC METABOLIC PANEL
BUN/Creatinine Ratio: 22 (ref 12–28)
BUN: 13 mg/dL (ref 8–27)
CO2: 29 mmol/L (ref 20–29)
Calcium: 10.4 mg/dL — ABNORMAL HIGH (ref 8.7–10.3)
Chloride: 97 mmol/L (ref 96–106)
Creatinine, Ser: 0.59 mg/dL (ref 0.57–1.00)
GFR calc Af Amer: 101 mL/min/{1.73_m2} (ref >59)
GFR calc non Af Amer: 87 mL/min/{1.73_m2} (ref >59)
Glucose: 90 mg/dL (ref 65–99)
Potassium: 4 mmol/L (ref 3.5–5.2)
Sodium: 137 mmol/L (ref 134–144)

## 2016-12-04 ENCOUNTER — Other Ambulatory Visit: Payer: Self-pay | Admitting: Family Medicine

## 2016-12-04 DIAGNOSIS — R911 Solitary pulmonary nodule: Secondary | ICD-10-CM

## 2016-12-13 ENCOUNTER — Ambulatory Visit
Admission: RE | Admit: 2016-12-13 | Discharge: 2016-12-13 | Disposition: A | Discharge status: Home / Self Care | Payer: Medicare Other | Source: Ambulatory Visit | Attending: Family Medicine | Admitting: Family Medicine

## 2016-12-13 DIAGNOSIS — R911 Solitary pulmonary nodule: Secondary | ICD-10-CM

## 2016-12-27 ENCOUNTER — Ambulatory Visit (INDEPENDENT_AMBULATORY_CARE_PROVIDER_SITE_OTHER): Payer: Medicare Other | Admitting: Pharmacist

## 2016-12-27 VITALS — BP 134/60 | HR 57

## 2016-12-27 DIAGNOSIS — I119 Hypertensive heart disease without heart failure: Secondary | ICD-10-CM

## 2016-12-27 NOTE — Progress Notes (Signed)
Patient ID: Robin [name]                 DOB: Sep 26, 1934                      MRN: [id number]     HPI: Robin [name] is a 82 y.o. female referred by Dr. Oval Linsey to HTN clinic. PMH includes atrial fibrillation, anxiety, hyperlipidemia and mild carotid stenosis.  She is aware of her AF episodes, and feels that they are not as frequent since increasing her metoprolol to 150 mg daily.  She does not like the idea of taking the whole 150 mg at once, so she divides her dose into morning, supper and bedtime.  Patient presents for HTN follow up. Denies dizziness, chest pain, swelling or increase fatigue. Reports an episode of Afib lasting over 24 hours but now resolved.   Current HTN meds:  Diltiazem 180mg  daily (lunch) Metoprolol succinate 150mg  daily (50mg  every morning, supper and bedtime)  Previously tried: Amlodipine caused shortness of breath  BP goal: 130/80  Family History: Mother died from CHF (died at 52), father died heart disease, DM, multiple MI (died at 71)             1 sister with leukemia (71), 1 sister cancer (62)             1 daughter, no hypertension or heart disease   Social History: No tobacco, quit more than 30 years ago; some alcohol, maybe 2-3 glasses of wine per week; states 2 L bottle will usually last about 3 days  Diet:  coffee 2 cups in am, some Coke, Eats out regularly, no added salt; can tell when she eats high sodium (holds fluid in legs), rare fried foods  Exercise: 2-3 miles   Home BP readings: 15 readings; average 120/73 (pulse 56-76)  Wt Readings from Last 3 Encounters:  11/27/16 143 lb 9.6 oz (65.1 kg)  07/30/16 145 lb 12.8 oz (66.1 kg)  03/28/16 145 lb 12.8 oz (66.1 kg)   BP Readings from Last 3 Encounters:  12/27/16 134/60  11/27/16 (!) 165/72  08/28/16 (!) 162/78   Pulse Readings from Last 3 Encounters:  12/27/16 (!) 57  11/27/16 68  08/28/16 64    Past Medical History:  Diagnosis Date  . Anxiety   . AV bloc first degree   .  Hyperlipidemia   . Hypertension   . Lumbar pain    fell off of her horse and has low back pain at times  . Venous insufficiency of both lower extremities   . Vertigo     Current Outpatient Prescriptions on File Prior to Visit  Medication Sig Dispense Refill  . apixaban (ELIQUIS) 5 MG TABS tablet Take 1 tablet (5 mg total) by mouth 2 (two) times daily. 180 tablet 3  . BILBERRY-BIOFLAV-RUTIN-QUERCET PO Take 1,000 mg by mouth.    Marland Kitchen CALCIUM PO Take 1 tablet by mouth daily.     . Cholecalciferol (VITAMIN D PO) Take 2,400 mg by mouth daily.      Marland Kitchen diltiazem (CARDIZEM CD) 180 MG 24 hr capsule Take 1 capsule (180 mg total) by mouth daily. 30 capsule 5  . diphenoxylate-atropine (LOMOTIL) 2.5-0.025 MG per tablet Take 1 tablet by mouth daily as needed (for diarrhea).     . estradiol (ESTRACE) 1 MG tablet Take 1 mg by mouth daily.    . Lactobacillus Rhamnosus, GG, (CULTURELLE PO) Take 1 capsule by mouth every morning.    Marland Kitchen  LORazepam (ATIVAN) 0.5 MG tablet Take 0.5 mg by mouth 2 (two) times daily as needed for anxiety.    . Lutein 20 MG CAPS Take 1 capsule by mouth daily.     . metoprolol (TOPROL-XL) 100 MG 24 hr tablet TAKE 1/2 TABLET BY MOUTH IN THE MORNING AND 1 TABLET IN THE EVENING BY MOUTH DAILY    . Multiple Vitamins-Minerals (HAIR SKIN NAILS PO) Take 1 capsule by mouth daily.    . potassium chloride SA (K-DUR,KLOR-CON) 20 MEQ tablet TAKE 1 TABLET TWICE DAILY. 180 tablet 11  . triamcinolone (NASACORT ALLERGY 24HR) 55 MCG/ACT AERO nasal inhaler Place 2 sprays into the nose daily.    Marland Kitchen triamcinolone cream (KENALOG) 0.1 % Apply 1 application topically daily as needed (for rash).     . Triamterene-HCTZ (DYAZIDE PO) Take 25 mg by mouth daily.      . vitamin A 10000 UNIT capsule Take 10,000 Units by mouth daily.     No current facility-administered medications on file prior to visit.     Allergies  Allergen Reactions  . Iodinated Diagnostic Agents Shortness Of Breath  . Amlodipine      Shortness of breath  . Codeine Nausea Only  . Diphenhydramine Other (See Comments)    hypertension  . Diphenhydramine Hcl     Hypertension   . Pravastatin     Muscle aches  . Sulfa Antibiotics Rash  . Sulfa Drugs Cross Reactors Rash    Blood pressure 134/60, pulse (!) 57.  Benign hypertensive heart disease without heart failure Blood pressure today better control. Patient has some degree of white coat syndrome but readings are much improve today. BP readings from home also controlled over the past 2 months. Will continue current therapy without changes and follow up as needed. Patient was instructed to call clinic if BP persistent above 086 systolic of if any symptoms like dizziness or lightheadedness noted.   Elanore Talcott [name] PharmD, [place] [id number] 12/27/2016 9:19 PM

## 2016-12-27 NOTE — Assessment & Plan Note (Signed)
Blood pressure today better control. Patient has some degree of white coat syndrome but readings are much improve today. BP readings from home also controlled over the past 2 months. Will continue current therapy without changes and follow up as needed. Patient was instructed to call clinic if BP persistent above 833 systolic of if any symptoms like dizziness or lightheadedness noted.

## 2016-12-27 NOTE — Patient Instructions (Addendum)
Return for a follow up appointment in as needed  Your blood pressure today is 134/60 pulse 57  Check your blood pressure at home daily (if able) and keep record of the readings.  Take your BP meds as follows: **Continue all medication as prescribed**  Bring your BP cuff and your record of home blood pressures to your next appointment.  Exercise as you're able, try to walk approximately 30 minutes per day.  Keep salt intake to a minimum, especially watch canned and prepared boxed foods.  Eat more fresh fruits and vegetables and fewer canned items.  Avoid eating in fast food restaurants.    HOW TO TAKE YOUR BLOOD PRESSURE: . Rest 5 minutes before taking your blood pressure. .  Don't smoke or drink caffeinated beverages for at least 30 minutes before. . Take your blood pressure before (not after) you eat. . Sit comfortably with your back supported and both feet on the floor (don't cross your legs). . Elevate your arm to heart level on a table or a desk. . Use the proper sized cuff. It should fit smoothly and snugly around your bare upper arm. There should be enough room to slip a fingertip under the cuff. The bottom edge of the cuff should be 1 inch above the crease of the elbow. . Ideally, take 3 measurements at one sitting and record the average.

## 2017-01-17 ENCOUNTER — Other Ambulatory Visit: Payer: Self-pay | Admitting: Cardiovascular Disease

## 2017-01-17 NOTE — Telephone Encounter (Signed)
Please review for refill. Thanks!  

## 2017-02-27 ENCOUNTER — Other Ambulatory Visit: Payer: Self-pay | Admitting: Cardiovascular Disease

## 2017-02-27 ENCOUNTER — Encounter: Payer: Self-pay | Admitting: Cardiovascular Disease

## 2017-02-27 ENCOUNTER — Ambulatory Visit (INDEPENDENT_AMBULATORY_CARE_PROVIDER_SITE_OTHER): Payer: Medicare Other | Admitting: Cardiovascular Disease

## 2017-02-27 VITALS — BP 171/77 | HR 61 | Ht 65.0 in | Wt 142.6 lb

## 2017-02-27 DIAGNOSIS — I119 Hypertensive heart disease without heart failure: Secondary | ICD-10-CM

## 2017-02-27 DIAGNOSIS — Z5181 Encounter for therapeutic drug level monitoring: Secondary | ICD-10-CM | POA: Diagnosis not present

## 2017-02-27 DIAGNOSIS — I480 Paroxysmal atrial fibrillation: Secondary | ICD-10-CM | POA: Diagnosis not present

## 2017-02-27 DIAGNOSIS — E7800 Pure hypercholesterolemia, unspecified: Secondary | ICD-10-CM

## 2017-02-27 DIAGNOSIS — I6523 Occlusion and stenosis of bilateral carotid arteries: Secondary | ICD-10-CM | POA: Diagnosis not present

## 2017-02-27 LAB — BASIC METABOLIC PANEL
BUN/Creatinine Ratio: 18 (ref 12–28)
BUN: 12 mg/dL (ref 8–27)
CO2: 27 mmol/L (ref 20–29)
Calcium: 10.1 mg/dL (ref 8.7–10.3)
Chloride: 97 mmol/L (ref 96–106)
Creatinine, Ser: 0.68 mg/dL (ref 0.57–1.00)
GFR calc Af Amer: 96 mL/min/{1.73_m2} (ref >59)
GFR calc non Af Amer: 83 mL/min/{1.73_m2} (ref >59)
Glucose: 93 mg/dL (ref 65–99)
Potassium: 4.1 mmol/L (ref 3.5–5.2)
Sodium: 137 mmol/L (ref 134–144)

## 2017-02-27 NOTE — Progress Notes (Signed)
Cardiology Office Note   Date:  03/02/2017   ID:  Robin [name], DOB 1935-05-23, MRN [id number]  PCP:  Robin Stalker, PA-C  Cardiologist:   Robin Latch, MD   No chief complaint on file.    History of Present Illness: Robin [name] is a 82 y.o. female with hypertension, paroxysmal atrial fibrillation, and hyperlipidemia and mild carotid stenosis who presents for follow-up.  Robin [name] is a former patient of Dr. Mare [name]. She had a heart cath in 2009 that she reports was normal.  Carotid Doppler 08/2013 revealed <50% stenosis bilaterally. She reported recurrent palpitations and wore a 30 day event monitor 10/2015 that showed occasional short episodes of atrial fibrillation.  She has been managed on Eliquis, diltiazem and metoprolol.  At her last appointment [name]'s blood pressure was running above goal.  She kept a log of her blood pressure at home which had been well-controlled. She followed up with the pharmacist 12/2016 and no changes were made. She reports episodes of atrial fibrillation approximately once a month. Overall it has been well-controlled. For the last 2 days she feels like she has been in and out of atrial fibrillation. She has been limiting the chocolate and her diet and this seems to help. When her heart races she eats but no ice cream and it subsides. Episodes are associated with lightheadedness and presyncope but no chest pain or shortness of breath. Lately her blood pressure has been in the 295J systolic. She denies any chest pain, shortness of breath, lower extremity edema, orthopnea, or PND. She also has not had any bleeding issues.   Past Medical History:  Diagnosis Date  . Anxiety   . AV bloc first degree   . Carotid stenosis 03/02/2017  . Hyperlipidemia   . Hypertension   . Lumbar pain    fell off of her horse and has low back pain at times  . Venous insufficiency of both lower extremities   . Vertigo     Past Surgical History:  Procedure  Laterality Date  . ABDOMINAL HYSTERECTOMY    . BREAST BIOPSY Left   . BREAST EXCISIONAL BIOPSY Right   . BREAST LUMPECTOMY WITH RADIOACTIVE SEED LOCALIZATION Left 05/05/2015   Procedure: LEFT BREAST LUMPECTOMY WITH RADIOACTIVE SEED LOCALIZATION;  Surgeon: Robin Messing III, MD;  Location: [facility];  Service: General;  Laterality: Left;  . CHOLECYSTECTOMY    . EYE SURGERY     bil cataract surgery  . LUNG SURGERY       Current Outpatient Prescriptions  Medication Sig Dispense Refill  . BILBERRY-BIOFLAV-RUTIN-QUERCET PO Take 1,000 mg by mouth.    . Cholecalciferol (VITAMIN D PO) Take 2,400 mg by mouth daily.      Marland Kitchen diltiazem (CARDIZEM CD) 180 MG 24 hr capsule Take 1 capsule (180 mg total) by mouth daily. 30 capsule 5  . diphenoxylate-atropine (LOMOTIL) 2.5-0.025 MG per tablet Take 1 tablet by mouth daily as needed (for diarrhea).     Arne Cleveland 5 MG TABS tablet TAKE 1 TABLET TWICE DAILY. 180 tablet 1  . estradiol (ESTRACE) 1 MG tablet Take 1 mg by mouth daily.    . Lactobacillus Rhamnosus, GG, (CULTURELLE PO) Take 1 capsule by mouth every morning.    Marland Kitchen LORazepam (ATIVAN) 0.5 MG tablet Take 0.5 mg by mouth 2 (two) times daily as needed for anxiety.    . Lutein 20 MG CAPS Take 1 capsule by mouth daily.     . metoprolol (TOPROL-XL)  100 MG 24 hr tablet TAKE 1/2 TABLET BY MOUTH IN THE MORNING AND 1 TABLET IN THE EVENING BY MOUTH DAILY    . Multiple Vitamins-Minerals (HAIR SKIN NAILS PO) Take 1 capsule by mouth daily.    . potassium chloride SA (K-DUR,KLOR-CON) 20 MEQ tablet TAKE 1 TABLET TWICE DAILY. 180 tablet 11  . triamcinolone (NASACORT ALLERGY 24HR) 55 MCG/ACT AERO nasal inhaler Place 2 sprays into the nose daily.    Marland Kitchen triamcinolone cream (KENALOG) 0.1 % Apply 1 application topically daily as needed (for rash).     . Triamterene-HCTZ (DYAZIDE PO) Take 25 mg by mouth daily.      . vitamin A 10000 UNIT capsule Take 10,000 Units by mouth daily.     No current  facility-administered medications for this visit.     Allergies:   Iodinated diagnostic agents; Amlodipine; Codeine; Diphenhydramine; Diphenhydramine hcl; Pravastatin; Sulfa antibiotics; and Sulfa drugs cross reactors    Social History:  The patient  reports that she has never smoked. She has never used smokeless tobacco. She reports that she drinks alcohol. She reports that she does not use drugs.   Family History:  The patient's family history includes Cancer in her sister; Heart disease in her father and mother; Hypertension in her brother, mother, sister, sister, and sister; Stroke in her mother.    ROS:  Please see the history of present illness.   Otherwise, review of systems are positive for back pain.   All other systems are reviewed and negative.    PHYSICAL EXAM: VS:  BP (!) 171/77   Pulse 61   Ht 5\' 5"  (1.651 m)   Wt 64.7 kg (142 lb 9.6 oz)   SpO2 96%   BMI 23.73 kg/m  , BMI Body mass index is 23.73 kg/m. GENERAL:  Well appearing HEENT: Pupils equal round and reactive, fundi not visualized, oral mucosa unremarkable NECK:  No jugular venous distention, waveform within normal limits, carotid upstroke brisk and symmetric, no bruits, no thyromegaly LUNGS:  Clear to auscultation bilaterally HEART:  RRR.  PMI not displaced or sustained,S1 and S2 within normal limits, no S3, no S4, no clicks, no rubs, no murmurs ABD:  Flat, positive bowel sounds normal in frequency in pitch, no bruits, no rebound, no guarding, no midline pulsatile mass, no hepatomegaly, no splenomegaly EXT:  2 plus pulses throughout, no edema, no cyanosis no clubbing SKIN:  No rashes no nodules NEURO:  Cranial nerves II through XII grossly intact, motor grossly intact throughout PSYCH:  Cognitively intact, oriented to person place and time    EKG:  EKG is ordered today. The ekg ordered 11/27/16 demonstrates sinus rhythm rate 68 bpm.  First degree AV block.   30 day Event Monitor 11/08/15:  Quality: Fair.   Baseline artifact. Predominant rhythm: sinus rhythm at atrial fibrillation both noted Sinus rhythm: Average heart rate: 73 bpm Max heart rate: 123 bpm Min heart rate: 46 bpm  Atrial fibrillation: Rates 58-122 bpm.   <1% atrial fibrillation burden.  Recent Labs: 11/27/2016: Hemoglobin 14.3; Platelets 290 02/27/2017: BUN 12; Creatinine, Ser 0.68; Potassium 4.1; Sodium 137   07/30/16: Sodium 139, potassium 3.6, BUN 13, creatinine 0.69 AST 14, ALT 15 Total cholesterol 198, triglycerides 131, HDL 63, LDL 109   Lipid Panel    Component Value Date/Time   CHOL 213 (H) 06/14/2011 0832   TRIG 85.0 06/14/2011 0832   HDL 32.60 (L) 06/14/2011 0832   CHOLHDL 7 06/14/2011 0832   VLDL 17.0 06/14/2011 0175  LDLDIRECT 161.5 06/14/2011 0832      Wt Readings from Last 3 Encounters:  02/27/17 64.7 kg (142 lb 9.6 oz)  11/27/16 65.1 kg (143 lb 9.6 oz)  07/30/16 66.1 kg (145 lb 12.8 oz)      ASSESSMENT AND PLAN:  # Paroxysmal atrial fibrillation/flutter # PACs: Symptoms have been well controlled on diltiazem and metoprolol. Continue Eliquis.   # Hypertension: I suspect she has white coat hypertension. Her blood pressure is elevated today but has been well-controlled at home. Continue diltiazem, metoprolol, and hydrochlorothiazide/triamterene.  # Hyperlipidemia: LDL was 109 07/2016.  Given her carotid stenosis that should be lower. We will discuss follow-up.    # Carotid stenosis: <50% stenosis bilaterally in 08/2013. Repeat at follow-up.  Current medicines are reviewed at length with the patient today.  The patient does not have concerns regarding medicines.  The following changes have been made:  no change  Labs/ tests ordered today include:   Orders Placed This Encounter  Procedures  . Basic metabolic panel      Disposition:   FU with Masahiro Iglesia C. Oval Linsey, MD, [facility] in 6 months.     This note was written with the assistance of speech recognition software.  Please excuse any  transcriptional errors.  Signed, Jarvis Knodel C. Oval Linsey, MD, [facility]  03/02/2017 11:14 AM    [place]

## 2017-02-27 NOTE — Patient Instructions (Signed)
Medication Instructions:  Your physician recommends that you continue on your current medications as directed. Please refer to the Current Medication list given to you today.  Labwork: BMET TODAY   Testing/Procedures: NONE  Follow-Up: Your physician wants you to follow-up in: [place] will receive a reminder letter in the mail two months in advance. If you don't receive a letter, please call our office to schedule the follow-up appointment.  If you need a refill on your cardiac medications before your next appointment, please call your pharmacy.

## 2017-03-02 ENCOUNTER — Encounter: Payer: Self-pay | Admitting: Cardiovascular Disease

## 2017-03-02 DIAGNOSIS — I6529 Occlusion and stenosis of unspecified carotid artery: Secondary | ICD-10-CM

## 2017-03-02 HISTORY — DX: Occlusion and stenosis of unspecified carotid artery: I65.29

## 2017-05-30 ENCOUNTER — Other Ambulatory Visit: Payer: Self-pay | Admitting: General Surgery

## 2017-05-30 DIAGNOSIS — Z1231 Encounter for screening mammogram for malignant neoplasm of breast: Secondary | ICD-10-CM

## 2017-07-01 ENCOUNTER — Telehealth: Payer: Self-pay | Admitting: Cardiovascular Disease

## 2017-07-01 NOTE — Telephone Encounter (Signed)
Spoke with pt, she has tried tylenol and muscle cream. Patient given the okay to take advil for the pain. Patient voiced understanding to only take the advil once or twice a week but no more. She will call her medical doctor if she continues to have problems.

## 2017-07-01 NOTE — Telephone Encounter (Signed)
New message  Patient says that she has a crook in her neck and the only thing she can takes is Tylenol due to being on blood thinners Is there anything that can be recommended other that tylenol? Please call

## 2017-07-15 ENCOUNTER — Other Ambulatory Visit: Payer: Self-pay | Admitting: Cardiovascular Disease

## 2017-07-15 NOTE — Telephone Encounter (Signed)
Please advise for refill, Thanks !  

## 2017-08-30 ENCOUNTER — Other Ambulatory Visit: Payer: Self-pay | Admitting: Cardiovascular Disease

## 2017-08-30 ENCOUNTER — Other Ambulatory Visit: Payer: Self-pay | Admitting: Family Medicine

## 2017-08-30 DIAGNOSIS — IMO0001 Reserved for inherently not codable concepts without codable children: Secondary | ICD-10-CM

## 2017-08-30 DIAGNOSIS — R911 Solitary pulmonary nodule: Secondary | ICD-10-CM

## 2017-08-30 NOTE — Telephone Encounter (Signed)
REFILL 

## 2017-08-30 NOTE — Telephone Encounter (Signed)
Please review for refill. Thanks!  

## 2017-09-16 ENCOUNTER — Ambulatory Visit
Admission: RE | Admit: 2017-09-16 | Discharge: 2017-09-16 | Disposition: A | Discharge status: Home / Self Care | Payer: Medicare Other | Source: Ambulatory Visit | Attending: General Surgery | Admitting: General Surgery

## 2017-09-16 DIAGNOSIS — Z1231 Encounter for screening mammogram for malignant neoplasm of breast: Secondary | ICD-10-CM

## 2017-09-18 ENCOUNTER — Ambulatory Visit: Payer: Medicare Other | Admitting: Cardiovascular Disease

## 2017-09-19 ENCOUNTER — Other Ambulatory Visit: Payer: Self-pay | Admitting: Cardiovascular Disease

## 2017-09-19 NOTE — Telephone Encounter (Signed)
Refill Request.  

## 2017-10-10 ENCOUNTER — Telehealth: Payer: Self-pay | Admitting: Cardiovascular Disease

## 2017-10-10 NOTE — Telephone Encounter (Signed)
New message    Patient calling with concerns about HR.  STAT if HR is under 50 or over 120 (normal HR is 60-100 beats per minute)  1) What is your heart rate? Around  100  2) Do you have a log of your heart rate readings (document readings)? 82, 49  3) Do you have any other symptoms? Weakness, feels like heart is skipping

## 2017-10-10 NOTE — Telephone Encounter (Signed)
Returned call to patient.She stated she had episode of Afib yesterday lasting appox 7 hours.Stated she has not had Afib in along time.She took a extra 1/2 of Toprol and 1/4 of Lorazepam.Stated no Afib this morning just feels weak, pulse 49 B/P 108/61.She would like to be seen before she goes a fishing trip she has had planned.Appointment scheduled with Jory Sims DNP 10/16/17 at 2:00 pm.

## 2017-10-15 NOTE — Progress Notes (Signed)
Cardiology Office Note   Date:  10/15/2017   ID:  SHANNAN SLINKER, DOB 1935-04-22, MRN [id number]  PCP:  [name], PA-C  Cardiologist:  Dr.  No chief complaint on file.    History of Present Illness: Robin [name] is a 82 y.o. female who presents for ongoing assessment and management of HTN, PAF, mild carotid artery stenosis,  and hyperlipidemia.She has a history of cardia cath in 2009 that revealed normal coronary arteries. Holter monitor in 10/2015 revealing short episodes, of atrial fibrillation. She is on Eliquis, diltiazem, and metoprolol. When last seen by Dr. Oval Linsey on 01/27/2018 she reported episodes of atrial fib about once a month.   She comes today reporting 3 days of atrial fib last week. She states that she was overly tired during those days and felt that she had done too much. She stated that she felt badly during that time. She spontaneously converted to regular rhythm and has felt better. She states she is very aware of when she is in or out of Atrial fib. She did not take extra doses of metoprolol because her HR never went over 70-80 and was afraid that she would lower it too much.   She is medically compliant with Eliquis with Diltiazem and metoprolol, but splits her evening dose of metoprolol to 1/2 tablet at supper and 1/2 at bedtime instead of BID as directed. Again due to fears of making HR too low. She states the doses in the evening are about 5-6 hours apart.    Past Medical History:  Diagnosis Date  . Anxiety   . AV bloc first degree   . Carotid stenosis 03/02/2017  . Hyperlipidemia   . Hypertension   . Lumbar pain    fell off of her horse and has low back pain at times  . Venous insufficiency of both lower extremities   . Vertigo     Past Surgical History:  Procedure Laterality Date  . ABDOMINAL HYSTERECTOMY    . BREAST BIOPSY Left   . BREAST EXCISIONAL BIOPSY Right   . BREAST LUMPECTOMY WITH RADIOACTIVE SEED LOCALIZATION Left 05/05/2015    Procedure: LEFT BREAST LUMPECTOMY WITH RADIOACTIVE SEED LOCALIZATION;  Surgeon: Autumn Messing III, MD;  Location: [place];  Service: General;  Laterality: Left;  . CHOLECYSTECTOMY    . EYE SURGERY     bil cataract surgery  . LUNG SURGERY       Current Outpatient Medications  Medication Sig Dispense Refill  . BILBERRY-BIOFLAV-RUTIN-QUERCET PO Take 1,000 mg by mouth.    . Cholecalciferol (VITAMIN D PO) Take 2,400 mg by mouth daily.      Marland Kitchen diltiazem (CARDIZEM CD) 180 MG 24 hr capsule Take 1 capsule (180 mg total) by mouth daily. 30 capsule 5  . diltiazem (TIAZAC) 180 MG 24 hr capsule Take 1 capsule (180 mg total) by mouth daily. KEEP OV. 90 capsule 1  . diphenoxylate-atropine (LOMOTIL) 2.5-0.025 MG per tablet Take 1 tablet by mouth daily as needed (for diarrhea).     Arne Cleveland 5 MG TABS tablet TAKE 1 TABLET BY MOUTH TWICE DAILY. 180 tablet 1  . estradiol (ESTRACE) 1 MG tablet Take 1 mg by mouth daily.    . Lactobacillus Rhamnosus, GG, (CULTURELLE PO) Take 1 capsule by mouth every morning.    Marland Kitchen LORazepam (ATIVAN) 0.5 MG tablet Take 0.5 mg by mouth 2 (two) times daily as needed for anxiety.    . Lutein 20 MG CAPS Take 1 capsule by  mouth daily.     . metoprolol (TOPROL-XL) 100 MG 24 hr tablet TAKE 1/2 TABLET BY MOUTH IN THE MORNING AND 1 TABLET IN THE EVENING BY MOUTH DAILY    . Multiple Vitamins-Minerals (HAIR SKIN NAILS PO) Take 1 capsule by mouth daily.    . potassium chloride SA (K-DUR,KLOR-CON) 20 MEQ tablet TAKE 1 TABLET TWICE DAILY. 180 tablet 11  . potassium chloride SA (K-DUR,KLOR-CON) 20 MEQ tablet TAKE 1 TABLET BY MOUTH TWICE DAILY. 180 tablet 0  . triamcinolone (NASACORT ALLERGY 24HR) 55 MCG/ACT AERO nasal inhaler Place 2 sprays into the nose daily.    Marland Kitchen triamcinolone cream (KENALOG) 0.1 % Apply 1 application topically daily as needed (for rash).     . Triamterene-HCTZ (DYAZIDE PO) Take 25 mg by mouth daily.      . vitamin A 10000 UNIT capsule Take 10,000 Units by  mouth daily.     No current facility-administered medications for this visit.     Allergies:   Iodinated diagnostic agents; Amlodipine; Codeine; Diphenhydramine; Diphenhydramine hcl; Pravastatin; Sulfa antibiotics; and Sulfa drugs cross reactors    Social History:  The patient  reports that she has never smoked. She has never used smokeless tobacco. She reports that she drinks alcohol. She reports that she does not use drugs.   Family History:  The patient's family history includes Cancer in her sister; Heart disease in her father and mother; Hypertension in her brother, mother, sister, sister, and sister; Stroke in her mother.    ROS: All other systems are reviewed and negative. Unless otherwise mentioned in H&P    PHYSICAL EXAM: VS:  There were no vitals taken for this visit. , BMI There is no height or weight on file to calculate BMI. GEN: Well nourished, well developed, in no acute distress  HEENT: normal  Neck: no JVD, carotid bruits, or masses Cardiac: RRR; no murmurs, rubs, or gallops,no edema  Respiratory:  clear to auscultation bilaterally, normal work of breathing GI: soft, nontender, nondistended, + BS MS: no deformity or atrophy  Skin: warm and dry, no rash Neuro:  Strength and sensation are intact Psych: euthymic mood, full affect   EKG: Sinus bradycardia 58 bpm with 1st degree AV block PR 259 ms.   Recent Labs: 11/27/2016: Hemoglobin 14.3; Platelets 290 02/27/2017: BUN 12; Creatinine, Ser 0.68; Potassium 4.1; Sodium 137    Lipid Panel    Component Value Date/Time   CHOL 213 (H) 06/14/2011 0832   TRIG 85.0 06/14/2011 0832   HDL 32.60 (L) 06/14/2011 0832   CHOLHDL 7 06/14/2011 0832   VLDL 17.0 06/14/2011 0832   LDLDIRECT 161.5 06/14/2011 0832      Wt Readings from Last 3 Encounters:  02/27/17 142 lb 9.6 oz (64.7 kg)  11/27/16 143 lb 9.6 oz (65.1 kg)  07/30/16 145 lb 12.8 oz (66.1 kg)    Other studies Reviewed:  Echocardiogram 08/09/2015.  Left  ventricle: The cavity size was normal. Wall thickness was   normal. Systolic function was normal. The estimated ejection   fraction was in the range of 55% to 60%. Wall motion was normal;   there were no regional wall motion abnormalities. The study is   not technically sufficient to allow evaluation of LV diastolic   function. - Mitral valve: There was mild regurgitation. - Left atrium: The atrium was mildly dilated. - Right atrium: The atrium was mildly dilated   ASSESSMENT AND PLAN:  1.Paroxysmal Atrial fib: She endorses 3 days of constant atrial fib last  week that resolved on its own. She attributes this to being overtired and "doing too much." She is now feeling better. I have considered increasing diltiazem to 220 mg daily as she is also hypertensive. However she refuses this now as at home BP is normal. May need to consider increasing dose of metoprolol if she continues to more recurrent atrial fib, more often and lasting longer. Continue Eliquis.   2.  Hypertension:  BP is elevated on this office visit. I rechecked it again and found it to be more elevated at 182/98. She states its because she is nervous being here, and that BP's are normal at home. I have advised her to take one of the Ativan tablets when she returns home and to monitor her BP. If remains elevated will need to consider medication titration.   3. Hypercholesterolemia: Currently not on a statin. She is intolerant.   4. Anxiety:  Self reported today. She will take Ativan when she returns home.    Current medicines are reviewed at length with the patient today.    Labs/ tests ordered today include: ]We requested recent labs from PCP.    Phill Myron. West Pugh, ANP, AACC   10/15/2017 8:11 AM    [facility] [address] [place], [place] [address] Phone: [phone]; Fax: [phone]

## 2017-10-16 ENCOUNTER — Ambulatory Visit: Payer: Medicare Other | Admitting: Adult Health

## 2017-10-16 ENCOUNTER — Encounter: Payer: Self-pay | Admitting: Adult Health

## 2017-10-16 VITALS — BP 166/74 | HR 58 | Ht 64.5 in | Wt 144.0 lb

## 2017-10-16 DIAGNOSIS — I480 Paroxysmal atrial fibrillation: Secondary | ICD-10-CM

## 2017-10-16 DIAGNOSIS — E7800 Pure hypercholesterolemia, unspecified: Secondary | ICD-10-CM | POA: Diagnosis not present

## 2017-10-16 DIAGNOSIS — I10 Essential (primary) hypertension: Secondary | ICD-10-CM | POA: Diagnosis not present

## 2017-10-16 DIAGNOSIS — F419 Anxiety disorder, unspecified: Secondary | ICD-10-CM | POA: Diagnosis not present

## 2017-10-16 NOTE — Patient Instructions (Signed)
Medication Instructions:  NO CHANGES- Your physician recommends that you continue on your current medications as directed. Please refer to the Current Medication list given to you today.  If you need a refill on your cardiac medications before your next appointment, please call your pharmacy.  Follow-Up: Your physician wants you to follow-up in: make sure to keep scheduled appointment.    Thank you for choosing [facility]!!

## 2017-10-30 ENCOUNTER — Encounter: Payer: Self-pay | Admitting: Cardiovascular Disease

## 2017-11-01 ENCOUNTER — Ambulatory Visit (INDEPENDENT_AMBULATORY_CARE_PROVIDER_SITE_OTHER): Payer: Medicare Other | Admitting: Cardiovascular Disease

## 2017-11-01 ENCOUNTER — Encounter: Payer: Self-pay | Admitting: Cardiovascular Disease

## 2017-11-01 VITALS — BP 158/72 | HR 67 | Ht 64.5 in | Wt 146.8 lb

## 2017-11-01 DIAGNOSIS — I6523 Occlusion and stenosis of bilateral carotid arteries: Secondary | ICD-10-CM

## 2017-11-01 DIAGNOSIS — I10 Essential (primary) hypertension: Secondary | ICD-10-CM | POA: Diagnosis not present

## 2017-11-01 DIAGNOSIS — I480 Paroxysmal atrial fibrillation: Secondary | ICD-10-CM

## 2017-11-01 NOTE — Patient Instructions (Signed)
Medication Instructions:  Your physician recommends that you continue on your current medications as directed. Please refer to the Current Medication list given to you today.  Labwork: NONE  Testing/Procedures: Your physician has requested that you have a carotid duplex. This test is an ultrasound of the carotid arteries in your neck. It looks at blood flow through these arteries that supply the brain with blood. Allow one hour for this exam. There are no restrictions or special instructions.  Follow-Up: Your physician wants you to follow-up in: [place] will receive a reminder letter in the mail two months in advance. If you don't receive a letter, please call our office to schedule the follow-up appointment.  If you need a refill on your cardiac medications before your next appointment, please call your pharmacy.

## 2017-11-01 NOTE — Progress Notes (Signed)
Cardiology Office Note  Date:  11/01/2017   ID:  SHAYONNA OCAMPO, DOB 02-Dec-1934, MRN [id number]  PCP:  [name], PA-C  Cardiologist:   Skeet Latch, MD   Chief Complaint  Patient presents with  . Follow-up     History of Present Illness: Francyne E Sink is a 83 y.o. female with hypertension, paroxysmal atrial fibrillation, and hyperlipidemia and mild carotid stenosis who presents for follow-up.  [name] is a former patient of Dr. Mare Ferrari. She had a heart cath in 2009 that she reports was normal.  Carotid Doppler 08/2013 revealed <50% stenosis bilaterally. She reported recurrent palpitations and wore a 30 day event monitor 10/2015 that showed occasional short episodes of atrial fibrillation.  She has been managed on Eliquis, diltiazem and metoprolol.    [name] has been doing well.  She had some episodes of atrial fibrillation after the death of her dog.  She saw Beckie Busing, DNP, earlier this month and started taking both metoprolol and diltiazem in the morning.  Since then she has not had any recurrent episodes of atrial fibrillation.  She continues to check her blood pressure at home.  It is running in the 110s to 120s at night and 130s in the morning.  She has a long-standing history of whitecoat hypertension.  Her only complaint today is that she tripped and fell after walking on a tire.  She sprained her right ankle.  She saw her PCP who checked an x-ray that reportedly was negative for fracture.  There was some concern that her swelling may have been due to gout and she was given a prescription for prednisone.  However she did not fill this as she was concerned that she should be taking that with Eliquis.  Overall the ankle is getting better.  Her swelling is improving but still persist somewhat.  She is able to ambulate.  She took 2 doses of Advil yesterday and thinks that that helped.   Past Medical History:  Diagnosis Date  . Anxiety   . AV bloc first degree    . Carotid stenosis 03/02/2017  . Hyperlipidemia   . Hypertension   . Lumbar pain    fell off of her horse and has low back pain at times  . Venous insufficiency of both lower extremities   . Vertigo     Past Surgical History:  Procedure Laterality Date  . ABDOMINAL HYSTERECTOMY    . BREAST BIOPSY Left   . BREAST EXCISIONAL BIOPSY Right   . BREAST LUMPECTOMY WITH RADIOACTIVE SEED LOCALIZATION Left 05/05/2015   Procedure: LEFT BREAST LUMPECTOMY WITH RADIOACTIVE SEED LOCALIZATION;  Surgeon: Autumn Messing III, MD;  Location: [facility];  Service: General;  Laterality: Left;  . CHOLECYSTECTOMY    . EYE SURGERY     bil cataract surgery  . LUNG SURGERY       Current Outpatient Medications  Medication Sig Dispense Refill  . BILBERRY-BIOFLAV-RUTIN-QUERCET PO Take 1,000 mg by mouth.    . Cholecalciferol (VITAMIN D PO) Take 2,400 mg by mouth daily.      Marland Kitchen diltiazem (CARDIZEM CD) 180 MG 24 hr capsule Take 1 capsule (180 mg total) by mouth daily. 30 capsule 5  . diltiazem (TIAZAC) 180 MG 24 hr capsule Take 1 capsule (180 mg total) by mouth daily. KEEP OV. 90 capsule 1  . diphenoxylate-atropine (LOMOTIL) 2.5-0.025 MG per tablet Take 1 tablet by mouth daily as needed (for diarrhea).     Arne Cleveland  5 MG TABS tablet TAKE 1 TABLET BY MOUTH TWICE DAILY. 180 tablet 1  . estradiol (ESTRACE) 1 MG tablet Take 1 mg by mouth daily.    . Lactobacillus Rhamnosus, GG, (CULTURELLE PO) Take 1 capsule by mouth every morning.    Marland Kitchen LORazepam (ATIVAN) 0.5 MG tablet Take 0.5 mg by mouth 2 (two) times daily as needed for anxiety.    . Lutein 20 MG CAPS Take 1 capsule by mouth daily.     . metoprolol (TOPROL-XL) 100 MG 24 hr tablet TAKE 1/2 TABLET BY MOUTH IN THE MORNING AND 1 TABLET IN THE EVENING BY MOUTH DAILY    . Multiple Vitamins-Minerals (HAIR SKIN NAILS PO) Take 1 capsule by mouth daily.    . potassium chloride SA (K-DUR,KLOR-CON) 20 MEQ tablet TAKE 1 TABLET TWICE DAILY. 180 tablet 11  .  triamcinolone (NASACORT ALLERGY 24HR) 55 MCG/ACT AERO nasal inhaler Place 2 sprays into the nose daily.    Marland Kitchen triamcinolone cream (KENALOG) 0.1 % Apply 1 application topically daily as needed (for rash).     . Triamterene-HCTZ (DYAZIDE PO) Take 25 mg by mouth daily.      . vitamin A 10000 UNIT capsule Take 10,000 Units by mouth daily.     No current facility-administered medications for this visit.     Allergies:   Iodinated diagnostic agents; Amlodipine; Codeine; Diphenhydramine; Diphenhydramine hcl; Pravastatin; Sulfa antibiotics; and Sulfa drugs cross reactors    Social History:  The patient  reports that she has never smoked. She has never used smokeless tobacco. She reports that she drinks alcohol. She reports that she does not use drugs.   Family History:  The patient's family history includes Cancer in her sister; Heart disease in her father and mother; Hypertension in her brother, mother, sister, sister, and sister; Stroke in her mother.    ROS:  Please see the history of present illness.   Otherwise, review of systems are positive for back pain.   All other systems are reviewed and negative.    PHYSICAL EXAM: VS:  BP (!) 158/72   Pulse 67   Ht 5' 4.5" (1.638 m)   Wt 146 lb 12.8 oz (66.6 kg)   BMI 24.81 kg/m  , BMI Body mass index is 24.81 kg/m. GENERAL:  Well appearing HEENT: Pupils equal round and reactive, fundi not visualized, oral mucosa unremarkable NECK:  No jugular venous distention, waveform within normal limits, carotid upstroke brisk and symmetric, no bruits LUNGS:  Clear to auscultation bilaterally HEART:  RRR.  PMI not displaced or sustained,S1 and S2 within normal limits, no S3, no S4, no clicks, no rubs, no murmurs ABD:  Flat, positive bowel sounds normal in frequency in pitch, no bruits, no rebound, no guarding, no midline pulsatile mass, no hepatomegaly, no splenomegaly EXT:  2 plus pulses throughout, no edema, no cyanosis no clubbing SKIN:  No rashes no  nodules NEURO:  Cranial nerves II through XII grossly intact, motor grossly intact throughout PSYCH:  Cognitively intact, oriented to person place and time   EKG:  EKG is not ordered today. The ekg ordered 11/27/16 demonstrates sinus rhythm rate 68 bpm.  First degree AV block.   30 day Event Monitor 11/08/15:  Quality: Fair.  Baseline artifact. Predominant rhythm: sinus rhythm at atrial fibrillation both noted Sinus rhythm: Average heart rate: 73 bpm Max heart rate: 123 bpm Min heart rate: 46 bpm  Atrial fibrillation: Rates 58-122 bpm.   <1% atrial fibrillation burden.  Recent Labs: 11/27/2016: Hemoglobin  14.3; Platelets 290 02/27/2017: BUN 12; Creatinine, Ser 0.68; Potassium 4.1; Sodium 137   07/30/16: Sodium 139, potassium 3.6, BUN 13, creatinine 0.69 AST 14, ALT 15 Total cholesterol 198, triglycerides 131, HDL 63, LDL 109  11/01/17:  Total cholesterol 181, triglycerides 80, HDL 64, LDL 101 Creatinine 0.63 TSH 2.24   Lipid Panel    Component Value Date/Time   CHOL 213 (H) 06/14/2011 0832   TRIG 85.0 06/14/2011 0832   HDL 32.60 (L) 06/14/2011 0832   CHOLHDL 7 06/14/2011 0832   VLDL 17.0 06/14/2011 0832   LDLDIRECT 161.5 06/14/2011 0832      Wt Readings from Last 3 Encounters:  11/01/17 146 lb 12.8 oz (66.6 kg)  10/16/17 144 lb (65.3 kg)  02/27/17 142 lb 9.6 oz (64.7 kg)      ASSESSMENT AND PLAN:  # Paroxysmal atrial fibrillation/flutter # PACs: Symptoms have been well controlled on diltiazem and metoprolol. Continue Eliquis.   # Hypertension:  # White coat hypertension: BP is well-controlled at home.  It is elevated here consistent with whitecoat hypertension. Her machine is accurate as verified in the office.  She was encouraged to continue checking it periodically.  Continue diltiazem, metoprolol, and HCTZ/triamterene.  # Hyperlipidemia: LDL 101 08/2017.  Consider statin if carotid stenosis has progressed.    # Carotid stenosis: <50% stenosis bilaterally  in 08/2013. Repeat carotid Dopplers.   Current medicines are reviewed at length with the patient today.  The patient does not have concerns regarding medicines.  The following changes have been made:  no change  Labs/ tests ordered today include:   No orders of the defined types were placed in this encounter.     Disposition:   FU with Anika Shore C. Oval Linsey, MD, [facility] in 6 months.      Signed, Korinne Greenstein C. Oval Linsey, MD, [facility]  11/01/2017 11:09 AM    [place]

## 2017-11-06 ENCOUNTER — Encounter: Payer: Self-pay | Admitting: Cardiovascular Disease

## 2017-11-19 ENCOUNTER — Ambulatory Visit (HOSPITAL_COMMUNITY)
Admission: RE | Admit: 2017-11-19 | Discharge: 2017-11-19 | Disposition: A | Discharge status: Home / Self Care | Payer: Medicare Other | Source: Ambulatory Visit | Attending: Cardiovascular Disease | Admitting: Cardiovascular Disease

## 2017-11-19 DIAGNOSIS — I10 Essential (primary) hypertension: Secondary | ICD-10-CM | POA: Insufficient documentation

## 2017-11-19 DIAGNOSIS — E785 Hyperlipidemia, unspecified: Secondary | ICD-10-CM | POA: Insufficient documentation

## 2017-11-19 DIAGNOSIS — I6523 Occlusion and stenosis of bilateral carotid arteries: Secondary | ICD-10-CM | POA: Diagnosis not present

## 2017-12-16 ENCOUNTER — Ambulatory Visit
Admission: RE | Admit: 2017-12-16 | Discharge: 2017-12-16 | Disposition: A | Discharge status: Home / Self Care | Payer: Medicare Other | Source: Ambulatory Visit | Attending: Family Medicine | Admitting: Family Medicine

## 2017-12-16 DIAGNOSIS — IMO0001 Reserved for inherently not codable concepts without codable children: Secondary | ICD-10-CM

## 2017-12-16 DIAGNOSIS — R911 Solitary pulmonary nodule: Secondary | ICD-10-CM

## 2017-12-30 ENCOUNTER — Other Ambulatory Visit: Payer: Self-pay | Admitting: Cardiovascular Disease

## 2018-02-20 ENCOUNTER — Telehealth: Payer: Self-pay | Admitting: Cardiovascular Disease

## 2018-02-20 NOTE — Telephone Encounter (Signed)
Spoke with patient and she has just not been feeling well. States she normally doesn't have such long lasting episodes of AFib. Scheduled appointment for tomorrow morning with Dr Oval Linsey

## 2018-02-20 NOTE — Telephone Encounter (Signed)
° ° ° ° ° ° ° ° ° °  1. Are you currently SOB (can you hear that pt is SOB on the phone)? A "little bit"  2. How long have you been experiencing SOB? 1 week  3. Are you SOB when sitting or when up moving around?  When moving around  4. Are you currently experiencing any other symptoms? Weakness, concerns about afib

## 2018-02-20 NOTE — Telephone Encounter (Signed)
Returned call to patient of Dr. Oval Linsey. She has had "weak" spells before and she had a really bad one 2 weeks ago and has had AF since then - her BP was low during this time in the low-100s. She has PAF but her AF episodes do not typically last long. She reports continued weakness  & shortness of breath. BP today was 137/73 and HR 60.   Patient recalls an episode of weakness when her K+ was low. She has not had recent labs in epic and last by PCP were early 2019 per patient.   She is compliant with eliquis.  She takes metoprolol succinate 50mg  @ breakfast, @ 5pm, @ bedtime (10:30pm) She takes diltiazem @ lunch  She is scheduled for MD OV in Nov - there are sooner openings with "held" slots or mid-Sept if she needs to be seen sooner.   Will route to Dr. Oval Linsey & Rip Harbour LPN to review & advise patient

## 2018-02-21 ENCOUNTER — Encounter: Payer: Self-pay | Admitting: Cardiovascular Disease

## 2018-02-21 ENCOUNTER — Ambulatory Visit: Payer: Medicare Other | Admitting: Cardiovascular Disease

## 2018-02-21 VITALS — BP 170/82 | HR 67 | Ht 65.0 in | Wt 148.2 lb

## 2018-02-21 DIAGNOSIS — I10 Essential (primary) hypertension: Secondary | ICD-10-CM

## 2018-02-21 DIAGNOSIS — Z5181 Encounter for therapeutic drug level monitoring: Secondary | ICD-10-CM | POA: Diagnosis not present

## 2018-02-21 DIAGNOSIS — I6523 Occlusion and stenosis of bilateral carotid arteries: Secondary | ICD-10-CM

## 2018-02-21 DIAGNOSIS — R0609 Other forms of dyspnea: Secondary | ICD-10-CM | POA: Diagnosis not present

## 2018-02-21 DIAGNOSIS — I480 Paroxysmal atrial fibrillation: Secondary | ICD-10-CM | POA: Diagnosis not present

## 2018-02-21 LAB — CBC WITH DIFFERENTIAL/PLATELET
Basophils Absolute: 0 10*3/uL (ref 0.0–0.2)
Basos: 1 %
EOS (ABSOLUTE): 0.2 10*3/uL (ref 0.0–0.4)
Eos: 3 %
Hematocrit: 39.6 % (ref 34.0–46.6)
Hemoglobin: 13.7 g/dL (ref 11.1–15.9)
Immature Grans (Abs): 0 10*3/uL (ref 0.0–0.1)
Immature Granulocytes: 0 %
Lymphocytes Absolute: 1.8 10*3/uL (ref 0.7–3.1)
Lymphs: 26 %
MCH: 31.1 pg (ref 26.6–33.0)
MCHC: 34.6 g/dL (ref 31.5–35.7)
MCV: 90 fL (ref 79–97)
Monocytes Absolute: 0.7 10*3/uL (ref 0.1–0.9)
Monocytes: 10 %
Neutrophils Absolute: 4.1 10*3/uL (ref 1.4–7.0)
Neutrophils: 60 %
Platelets: 257 10*3/uL (ref 150–450)
RBC: 4.41 x10E6/uL (ref 3.77–5.28)
RDW: 13.2 % (ref 12.3–15.4)
WBC: 6.8 10*3/uL (ref 3.4–10.8)

## 2018-02-21 LAB — BASIC METABOLIC PANEL
BUN/Creatinine Ratio: 24 (ref 12–28)
BUN: 20 mg/dL (ref 8–27)
CO2: 27 mmol/L (ref 20–29)
Calcium: 10.4 mg/dL — ABNORMAL HIGH (ref 8.7–10.3)
Chloride: 99 mmol/L (ref 96–106)
Creatinine, Ser: 0.82 mg/dL (ref 0.57–1.00)
GFR calc Af Amer: 78 mL/min/{1.73_m2} (ref >59)
GFR calc non Af Amer: 68 mL/min/{1.73_m2} (ref >59)
Glucose: 95 mg/dL (ref 65–99)
Potassium: 4.3 mmol/L (ref 3.5–5.2)
Sodium: 139 mmol/L (ref 134–144)

## 2018-02-21 LAB — TSH: TSH: 3.59 u[IU]/mL (ref 0.450–4.500)

## 2018-02-21 LAB — T4, FREE: Free T4: 1.37 ng/dL (ref 0.82–1.77)

## 2018-02-21 LAB — MAGNESIUM: Magnesium: 2 mg/dL (ref 1.6–2.3)

## 2018-02-21 NOTE — Progress Notes (Signed)
Cardiology Office Note  Date:  02/21/2018   ID:  BRIARROSE SHOR, DOB [birth year], MRN [id number]  PCP:  [name], PA-C  Cardiologist:   Skeet Latch, MD   No chief complaint on file.    History of Present Illness: Robin [name] is a 83 y.o. female with hypertension, paroxysmal atrial fibrillation, and hyperlipidemia and mild carotid stenosis who presents for follow-up.  Robin [name] is a former patient of Dr. Mare Ferrari. She had a heart cath in 2009 that she reports was normal.  Carotid Doppler 08/2013 revealed <50% stenosis bilaterally. She reported recurrent palpitations and wore a 30 day event monitor 10/2015 that showed occasional short episodes of atrial fibrillation.  She has been managed on Eliquis, diltiazem and metoprolol.    Robin [name] has been doing well.  She had some episodes of atrial fibrillation after the death of her dog.  She saw Beckie Busing, DNP, earlier this month and started taking both metoprolol and diltiazem in the morning.  Since then she has not had any recurrent episodes of atrial fibrillation.  She continues to check her blood pressure at home.  It is running in the 110s to 120s at night and 130s in the morning.  She has a long-standing history of whitecoat hypertension.  Her only complaint today is that she tripped and fell after walking on a tire.  She sprained her right ankle.  She saw her PCP who checked an x-ray that reportedly was negative for fracture.  There was some concern that her swelling may have been due to gout and she was given a prescription for prednisone.  However she did not fill this as she was concerned that she should be taking that with Eliquis.  Overall the ankle is getting better.  Her swelling is improving but still persist somewhat.  She is able to ambulate.  She took 2 doses of Advil yesterday and thinks that that helped.  Two weekends ago she had an episode of near syncope that was the worst she has experienced.  She had to  lay down on the floor.  Since then she has had persistent atrial fibrillation.  This morning she felt a little better.  She has a tired feeling in her chest.  There is no chest pain but she has been more short of breath.  When she tries to exert herself it is hard for her to catch her breath, especially when walking up inclines.  She has not felt any palpitations.  She has chronic lower extremity edema that is stable as long as she wears her compression stockings.  She has no orthopnea or PND.  She also complains of chronic arthritis pains.  Despite this she tries to keep walking for exercise.   Past Medical History:  Diagnosis Date  . Anxiety   . AV bloc first degree   . Carotid stenosis 03/02/2017  . Hyperlipidemia   . Hypertension   . Lumbar pain    fell off of her horse and has low back pain at times  . Venous insufficiency of both lower extremities   . Vertigo     Past Surgical History:  Procedure Laterality Date  . ABDOMINAL HYSTERECTOMY    . BREAST BIOPSY Left   . BREAST EXCISIONAL BIOPSY Right   . BREAST LUMPECTOMY WITH RADIOACTIVE SEED LOCALIZATION Left 05/05/2015   Procedure: LEFT BREAST LUMPECTOMY WITH RADIOACTIVE SEED LOCALIZATION;  Surgeon: Autumn Messing III, MD;  Location: [facility];  Service: General;  Laterality: Left;  . CHOLECYSTECTOMY    . EYE SURGERY     bil cataract surgery  . LUNG SURGERY       Current Outpatient Medications  Medication Sig Dispense Refill  . BILBERRY-BIOFLAV-RUTIN-QUERCET PO Take 1,000 mg by mouth.    . Cholecalciferol (VITAMIN D PO) Take 2,400 mg by mouth daily.      Marland Kitchen diltiazem (TIAZAC) 180 MG 24 hr capsule Take 1 capsule (180 mg total) by mouth daily. KEEP OV. 90 capsule 1  . diphenoxylate-atropine (LOMOTIL) 2.5-0.025 MG per tablet Take 1 tablet by mouth daily as needed (for diarrhea).     Arne Cleveland 5 MG TABS tablet TAKE 1 TABLET BY MOUTH TWICE DAILY. 180 tablet 0  . estradiol (ESTRACE) 1 MG tablet Take 1 mg by mouth daily.      . Lactobacillus Rhamnosus, GG, (CULTURELLE PO) Take 1 capsule by mouth every morning.    Marland Kitchen LORazepam (ATIVAN) 0.5 MG tablet Take 0.5 mg by mouth 2 (two) times daily as needed for anxiety.    . Lutein 20 MG CAPS Take 1 capsule by mouth daily.     . metoprolol (TOPROL-XL) 100 MG 24 hr tablet TAKE 1/2 TABLET BY MOUTH IN THE MORNING AND 1 TABLET IN THE EVENING BY MOUTH DAILY    . Multiple Vitamins-Minerals (HAIR SKIN NAILS PO) Take 1 capsule by mouth daily.    . potassium chloride SA (K-DUR,KLOR-CON) 20 MEQ tablet TAKE 1 TABLET TWICE DAILY. 180 tablet 11  . triamcinolone (NASACORT ALLERGY 24HR) 55 MCG/ACT AERO nasal inhaler Place 2 sprays into the nose daily.    Marland Kitchen triamcinolone cream (KENALOG) 0.1 % Apply 1 application topically daily as needed (for rash).     . Triamterene-HCTZ (DYAZIDE PO) Take 25 mg by mouth daily.      . vitamin A 10000 UNIT capsule Take 10,000 Units by mouth daily.     No current facility-administered medications for this visit.     Allergies:   Iodinated diagnostic agents; Amlodipine; Codeine; Diphenhydramine; Diphenhydramine hcl; Pravastatin; Sulfa antibiotics; and Sulfa drugs cross reactors    Social History:  The patient  reports that she has never smoked. She has never used smokeless tobacco. She reports that she drinks alcohol. She reports that she does not use drugs.   Family History:  The patient's family history includes Cancer in her sister; Heart disease in her father and mother; Hypertension in her brother, mother, sister, sister, and sister; Stroke in her mother.    ROS:  Please see the history of present illness.   Otherwise, review of systems are positive for back pain.   All other systems are reviewed and negative.    PHYSICAL EXAM: VS:  BP (!) 170/82   Pulse 67   Ht 5\' 5"  (1.651 m)   Wt 148 lb 3.2 oz (67.2 kg)   BMI 24.66 kg/m  , BMI Body mass index is 24.66 kg/m. GENERAL:  Well appearing HEENT: Pupils equal round and reactive, fundi not  visualized, oral mucosa unremarkable NECK:  No jugular venous distention, waveform within normal limits, carotid upstroke brisk and symmetric, no bruits LUNGS:  Clear to auscultation bilaterally HEART: Irregularly irregular.  PMI not displaced or sustained,S1 and S2 within normal limits, no S3, no S4, no clicks, no rubs, no murmurs ABD:  Flat, positive bowel sounds normal in frequency in pitch, no bruits, no rebound, no guarding, no midline pulsatile mass, no hepatomegaly, no splenomegaly EXT:  2 plus pulses throughout, no edema, no cyanosis  no clubbing SKIN:  No rashes no nodules NEURO:  Cranial nerves II through XII grossly intact, motor grossly intact throughout PSYCH:  Cognitively intact, oriented to person place and time   EKG:  EKG is ordered today. The ekg ordered 11/27/16 demonstrates sinus rhythm rate 68 bpm.  First degree AV block.  02/21/18: Wandering pacemaker.  Rate 67 bpm.  First degree AV block  30 day Event Monitor 11/08/15:  Quality: Fair.  Baseline artifact. Predominant rhythm: sinus rhythm at atrial fibrillation both noted Sinus rhythm: Average heart rate: 73 bpm Max heart rate: 123 bpm Min heart rate: 46 bpm  Atrial fibrillation: Rates 58-122 bpm.   <1% atrial fibrillation burden.  Carotid Dopplers 11/19/17: 1-39% ICA stenosis bilaterally.  Recent Labs: 02/27/2017: BUN 12; Creatinine, Ser 0.68; Potassium 4.1; Sodium 137   07/30/16: Sodium 139, potassium 3.6, BUN 13, creatinine 0.69 AST 14, ALT 15 Total cholesterol 198, triglycerides 131, HDL 63, LDL 109  11/01/17:  Total cholesterol 181, triglycerides 80, HDL 64, LDL 101 Creatinine 0.63 TSH 2.24   Lipid Panel    Component Value Date/Time   CHOL 213 (H) 06/14/2011 0832   TRIG 85.0 06/14/2011 0832   HDL 32.60 (L) 06/14/2011 0832   CHOLHDL 7 06/14/2011 0832   VLDL 17.0 06/14/2011 0832   LDLDIRECT 161.5 06/14/2011 0832      Wt Readings from Last 3 Encounters:  02/21/18 148 lb 3.2 oz (67.2 kg)    11/01/17 146 lb 12.8 oz (66.6 kg)  10/16/17 144 lb (65.3 kg)      ASSESSMENT AND PLAN:  # Paroxysmal atrial fibrillation/flutter # PACs: Today Robin [name] has a wandering pacemaker on EKG so her heart is irregular but not in atrial fibrillation.  It is less likely that this would be the cause of her exertional dyspnea.  Continue metoprolol, diltiazem, and Eliquis.  Check CMP, CBC, magnsium and thyroid.   # Exertional dyspnea:  We will get a Lexiscan Myoview to assess. She is euvolemic on exam.  # Hypertension:  # White coat hypertension: Her blood pressure is elevated here as always.  She brings a log of her pressure showing that it is well-controlled at home.  It is been consistently less than 130/90.  Continue diltiazem, metoprolol, and HCTZ/triamterene.  # Hyperlipidemia: LDL 101 08/2017.  # Carotid stenosis: <50% stenosis bilaterally in 08/2013. Repeat carotid Dopplers.    Current medicines are reviewed at length with the patient today.  The patient does not have concerns regarding medicines.  The following changes have been made:  no change  Labs/ tests ordered today include:   Orders Placed This Encounter  Procedures  . CBC with Differential/Platelet  . T4, free  . TSH  . Basic metabolic panel  . Magnesium  . MYOCARDIAL PERFUSION IMAGING  . EKG 12-Lead      Disposition:   FU with Jernard Reiber C. Oval Linsey, MD, [place] in 1 month.      Signed, Aybree Lanyon C. Oval Linsey, MD, [facility]  02/21/2018 11:30 AM    [place]

## 2018-02-21 NOTE — Patient Instructions (Signed)
Medication Instructions:  Your physician recommends that you continue on your current medications as directed. Please refer to the Current Medication list given to you today.  Labwork: TSH/FT4/BMET/CBC/MAGNESIUM TODAY   Testing/Procedures: Your physician has requested that you have a lexiscan myoview. For further information please visit [email or web address]. Please follow instruction sheet, as given.  Follow-Up: Your physician recommends that you schedule a follow-up appointment in: 1 MONTH  If you need a refill on your cardiac medications before your next appointment, please call your pharmacy.

## 2018-02-25 ENCOUNTER — Telehealth (HOSPITAL_COMMUNITY): Payer: Self-pay

## 2018-02-25 NOTE — Telephone Encounter (Signed)
Encounter complete. 

## 2018-02-27 ENCOUNTER — Ambulatory Visit (HOSPITAL_COMMUNITY)
Admission: RE | Admit: 2018-02-27 | Discharge: 2018-02-27 | Disposition: A | Discharge status: Home / Self Care | Payer: Medicare Other | Source: Ambulatory Visit | Attending: Cardiology | Admitting: Cardiology

## 2018-02-27 DIAGNOSIS — R0609 Other forms of dyspnea: Secondary | ICD-10-CM

## 2018-02-27 DIAGNOSIS — I480 Paroxysmal atrial fibrillation: Secondary | ICD-10-CM

## 2018-02-27 LAB — MYOCARDIAL PERFUSION IMAGING
LV dias vol: 54 mL (ref 46–106)
LV sys vol: 10 mL
Peak HR: 93 {beats}/min
Rest HR: 72 {beats}/min
SDS: 2
SRS: 2
SSS: 4
TID: 1

## 2018-02-27 MED ORDER — REGADENOSON 0.4 MG/5ML IV SOLN
0.4000 mg | Freq: Once | INTRAVENOUS | Status: AC
  Administered 2018-02-27: 0.4 mg via INTRAVENOUS

## 2018-02-27 MED ORDER — TECHNETIUM TC 99M TETROFOSMIN IV KIT
30.6000 | PACK | Freq: Once | INTRAVENOUS | Status: AC | PRN
  Administered 2018-02-27: 30.6 via INTRAVENOUS
  Filled 2018-02-27: qty 31

## 2018-02-27 MED ORDER — TECHNETIUM TC 99M TETROFOSMIN IV KIT
10.1000 | PACK | Freq: Once | INTRAVENOUS | Status: AC | PRN
  Administered 2018-02-27: 10.1 via INTRAVENOUS
  Filled 2018-02-27: qty 11

## 2018-03-02 ENCOUNTER — Other Ambulatory Visit: Payer: Self-pay | Admitting: Cardiovascular Disease

## 2018-03-13 ENCOUNTER — Other Ambulatory Visit: Payer: Self-pay | Admitting: Cardiovascular Disease

## 2018-03-28 ENCOUNTER — Ambulatory Visit: Payer: Medicare Other | Admitting: Cardiovascular Disease

## 2018-03-28 ENCOUNTER — Encounter: Payer: Self-pay | Admitting: Cardiovascular Disease

## 2018-03-28 VITALS — BP 148/72 | HR 62 | Ht 65.0 in | Wt 148.0 lb

## 2018-03-28 DIAGNOSIS — I480 Paroxysmal atrial fibrillation: Secondary | ICD-10-CM

## 2018-03-28 DIAGNOSIS — I119 Hypertensive heart disease without heart failure: Secondary | ICD-10-CM | POA: Diagnosis not present

## 2018-03-28 DIAGNOSIS — E7800 Pure hypercholesterolemia, unspecified: Secondary | ICD-10-CM | POA: Diagnosis not present

## 2018-03-28 DIAGNOSIS — R0609 Other forms of dyspnea: Secondary | ICD-10-CM

## 2018-03-28 DIAGNOSIS — I491 Atrial premature depolarization: Secondary | ICD-10-CM

## 2018-03-28 NOTE — Progress Notes (Signed)
Cardiology Office Note  Date:  03/28/2018   ID:  Robin [name], DOB January 27, 1935, MRN [id number]  PCP:  [name], PA-C  Cardiologist:   Skeet Latch, MD   No chief complaint on file.    History of Present Illness: Robin [name] is a 83 y.o. female with hypertension, paroxysmal atrial fibrillation, hyperlipidemia and mild carotid stenosis who presents for follow-up.  Robin [name] is a former [name] of Dr. Mare Ferrari. Robin [name] had a heart cath in 2009 that Robin [name] reports was normal.  Carotid Doppler 08/2013 revealed <50% stenosis bilaterally. Robin [name] reported recurrent palpitations and wore a 30 day event monitor 10/2015 that showed occasional short episodes of atrial fibrillation.  Robin [name] has been managed on Eliquis, diltiazem and metoprolol.  Robin [name] is not interested in statins.  At Robin [name] last appointment Robin [name] reported episodes of near syncope.  Robin [name] noted that Robin [name] heart was irregular but in clinic Robin [name] was found to have a wandering pacemaker.  Robin [name] was referred for a Lexiscan Myoview which revealed LVEF 81% and no ischemia.  Since that time Robin [name] took a trip to [place] and had a recurrent episode.  Robin [name] discovered that Robin [name] was actually having vertigo.  Robin [name] was given a prescription for meclizine but wonders if it is okay for Robin [name] to take this with Robin [name] other medications.  Robin [name] is also experienced swelling in Robin [name] left Achilles tendon.  Robin [name] recalls injuring this area many years ago but has not done so lately.  There is no pain, just swelling.  Robin [name] denies lower extremity edema in general.  Robin [name] has no orthopnea or PND.  Robin [name] does wear Robin [name] compression socks regularly which helps.  Overall Robin [name] breathing has been good.  Robin [name] has struggled with some sinus congestion especially on Robin right.  Robin [name] blood pressure at home continues to run in Robin 355D systolic.   Past Medical History:  Diagnosis Date  . Anxiety   . AV bloc first degree   . Carotid stenosis 03/02/2017  . Hyperlipidemia   . Hypertension   . Lumbar  pain    fell off of Robin [name] horse and has low back pain at times  . Venous insufficiency of both lower extremities   . Vertigo     Past Surgical History:  Procedure Laterality Date  . ABDOMINAL HYSTERECTOMY    . BREAST BIOPSY Left   . BREAST EXCISIONAL BIOPSY Right   . BREAST LUMPECTOMY WITH RADIOACTIVE SEED LOCALIZATION Left 05/05/2015   Procedure: LEFT BREAST LUMPECTOMY WITH RADIOACTIVE SEED LOCALIZATION;  Surgeon: Autumn Messing III, MD;  Location: [place];  Service: General;  Laterality: Left;  . CHOLECYSTECTOMY    . EYE SURGERY     bil cataract surgery  . LUNG SURGERY       Current Outpatient Medications  Medication Sig Dispense Refill  . BILBERRY-BIOFLAV-RUTIN-QUERCET PO Take 1,000 mg by mouth.    . Cholecalciferol (VITAMIN D PO) Take 2,400 mg by mouth daily.      Marland Kitchen diltiazem (TIAZAC) 180 MG 24 hr capsule Take 1 capsule (180 mg total) by mouth daily. KEEP OV. 90 capsule 1  . diphenoxylate-atropine (LOMOTIL) 2.5-0.025 MG per tablet Take 1 tablet by mouth daily as needed (for diarrhea).     Arne Cleveland 5 MG TABS tablet TAKE 1 TABLET BY MOUTH TWICE DAILY. 180 tablet 0  . estradiol (ESTRACE) 0.5 MG tablet Take 0.5 mg by mouth every other day.     . Lactobacillus Rhamnosus, GG, (CULTURELLE PO) Take  1 capsule by mouth every morning.    . Lutein 20 MG CAPS Take 1 capsule by mouth daily.     . metoprolol (TOPROL-XL) 100 MG 24 hr tablet TAKE 1/2 TABLET BY MOUTH IN Robin MORNING AND 1 TABLET IN Robin EVENING BY MOUTH DAILY    . Multiple Vitamins-Minerals (HAIR SKIN NAILS PO) Take 1 capsule by mouth daily.    . potassium chloride SA (K-DUR,KLOR-CON) 20 MEQ tablet TAKE 1 TABLET TWICE DAILY. 180 tablet 11  . triamcinolone (NASACORT ALLERGY 24HR) 55 MCG/ACT AERO nasal inhaler Place 2 sprays into Robin nose daily.    Marland Kitchen triamcinolone cream (KENALOG) 0.1 % Apply 1 application topically daily as needed (for rash).     . Triamterene-HCTZ (DYAZIDE PO) Take 25 mg by mouth daily.      . vitamin  A 10000 UNIT capsule Take 10,000 Units by mouth daily.    Marland Kitchen LORazepam (ATIVAN) 0.5 MG tablet Take 0.5 mg by mouth 2 (two) times daily as needed for anxiety.     No current facility-administered medications for this visit.     Allergies:   Iodinated diagnostic agents; Amlodipine; Codeine; Diphenhydramine; Diphenhydramine hcl; Pravastatin; Sulfa antibiotics; and Sulfa drugs cross reactors    Social History:  Robin [name]  reports that Robin [name] has never smoked. Robin [name] has never used smokeless tobacco. Robin [name] reports that Robin [name] drinks alcohol. Robin [name] reports that Robin [name] does not use drugs.   Family History:  Robin [name]'s family history includes Cancer in Robin [name] sister; Heart disease in Robin [name] father and mother; Hypertension in Robin [name] brother, mother, sister, sister, and sister; Stroke in Robin [name] mother.    ROS:  Please see Robin history of present illness.   Otherwise, review of systems are positive for back pain.   All other systems are reviewed and negative.    PHYSICAL EXAM: VS:  BP (!) 148/72   Pulse 62   Ht 5\' 5"  (1.651 m)   Wt 148 lb (67.1 kg)   SpO2 99%   BMI 24.63 kg/m  , BMI Body mass index is 24.63 kg/m. GENERAL:  Well appearing HEENT: Pupils equal round and reactive, fundi not visualized, oral mucosa unremarkable NECK:  No jugular venous distention, waveform within normal limits, carotid upstroke brisk and symmetric, no bruits LUNGS:  Clear to auscultation bilaterally HEART:  RRR.  PMI not displaced or sustained,S1 and S2 within normal limits, no S3, no S4, no clicks, no rubs, no murmurs ABD:  Flat, positive bowel sounds normal in frequency in pitch, no bruits, no rebound, no guarding, no midline pulsatile mass, no hepatomegaly, no splenomegaly EXT:  2 plus pulses throughout, no edema, no cyanosis no clubbing SKIN:  No rashes no nodules NEURO:  Cranial nerves II through XII grossly intact, motor grossly intact throughout PSYCH:  Cognitively intact, oriented to person place and time   EKG:  EKG is not  ordered today. Robin ekg ordered 11/27/16 demonstrates sinus rhythm rate 68 bpm.  First degree AV block.  02/21/18: Wandering pacemaker.  Rate 67 bpm.  First degree AV block  Lexiscan Myoview 02/2018:   Nuclear stress EF: 81%.  Robin left ventricular ejection fraction is hyperdynamic (>65%).  Robin study is normal.  This is a low risk study.   Low risk stress nuclear study with normal perfusion and normal left ventricular regional and global systolic function.   30 day Event Monitor 11/08/15:  Quality: Fair.  Baseline artifact. Predominant rhythm: sinus rhythm at atrial fibrillation both noted Sinus rhythm: Average heart rate: 73  bpm Max heart rate: 123 bpm Min heart rate: 46 bpm  Atrial fibrillation: Rates 58-122 bpm.   <1% atrial fibrillation burden.  Carotid Dopplers 11/19/17: 1-39% ICA stenosis bilaterally.  Recent Labs: 02/21/2018: BUN 20; Creatinine, Ser 0.82; Hemoglobin 13.7; Magnesium 2.0; Platelets 257; Potassium 4.3; Sodium 139; TSH 3.590   07/30/16: Sodium 139, potassium 3.6, BUN 13, creatinine 0.69 AST 14, ALT 15 Total cholesterol 198, triglycerides 131, HDL 63, LDL 109  11/01/17:  Total cholesterol 181, triglycerides 80, HDL 64, LDL 101 Creatinine 0.63 TSH 2.24   Lipid Panel    Component Value Date/Time   CHOL 213 (H) 06/14/2011 0832   TRIG 85.0 06/14/2011 0832   HDL 32.60 (L) 06/14/2011 0832   CHOLHDL 7 06/14/2011 0832   VLDL 17.0 06/14/2011 0832   LDLDIRECT 161.5 06/14/2011 0832      Wt Readings from Last 3 Encounters:  03/28/18 148 lb (67.1 kg)  02/27/18 148 lb (67.1 kg)  02/21/18 148 lb 3.2 oz (67.2 kg)      ASSESSMENT AND PLAN:  # Paroxysmal atrial fibrillation/flutter # PACs: Stable on metoprolol, diltiazem and Eliquis.    # Exertional dyspnea:  Resolved.  Lexiscan Myoview negative for ischemia 02/2018.   # Hypertension:  # White coat hypertension: Robin [name] blood pressure is elevated here as always.  It remains controlled at home. Continue  diltiazem, metoprolol, and HCTZ/triamterene.  # Hyperlipidemia: LDL 101 08/2017.  Robin [name] is uninterested in statins.    # Carotid stenosis: <50% stenosis bilaterally in 08/2013. Mild on Dopplers 11/2017.     Current medicines are reviewed at length with Robin [name] today.  Robin [name] does not have concerns regarding medicines.  Robin following changes have been made:  no change  Labs/ tests ordered today include:   No orders of Robin defined types were placed in this encounter.     Disposition:   FU with Destynie Toomey C. Oval Linsey, MD, [facility] in 6 months.      Signed, Lillianah Swartzentruber C. Oval Linsey, MD, [facility]  03/28/2018 11:00 AM    [facility]

## 2018-03-28 NOTE — Patient Instructions (Signed)
Medication Instructions:  Your physician recommends that you continue on your current medications as directed. Please refer to the Current Medication list given to you today.  If you need a refill on your cardiac medications before your next appointment, please call your pharmacy.   Lab work: NONE     Testing/Procedures: NONE  Follow-Up: At [facility], you and your health needs are our priority.  As part of our continuing mission to provide you with exceptional heart care, we have created designated Provider Care Teams.  These Care Teams include your primary Cardiologist (physician) and Advanced Practice Providers (APPs -  Physician Assistants and Nurse Practitioners) who all work together to provide you with the care you need, when you need it. You will need a follow up appointment in 6 months.  Please call our office 2 months in advance to schedule this appointment.  You may see DR Cole Camp or one of the following Advanced Practice Providers on your designated Care Team:   Luke Kilroy, PA-C Krista Kroeger, PA-C . Callie Goodrich, PA-C    

## 2018-04-01 ENCOUNTER — Other Ambulatory Visit: Payer: Self-pay | Admitting: Cardiovascular Disease

## 2018-05-12 ENCOUNTER — Ambulatory Visit: Payer: Medicare Other | Admitting: Cardiovascular Disease

## 2018-05-26 ENCOUNTER — Other Ambulatory Visit: Payer: Self-pay | Admitting: Cardiovascular Disease

## 2018-05-26 NOTE — Telephone Encounter (Signed)
Rx request sent to pharmacy.  

## 2018-08-11 ENCOUNTER — Other Ambulatory Visit: Payer: Self-pay | Admitting: Family Medicine

## 2018-08-11 ENCOUNTER — Other Ambulatory Visit: Payer: Self-pay | Admitting: General Surgery

## 2018-08-11 DIAGNOSIS — Z1231 Encounter for screening mammogram for malignant neoplasm of breast: Secondary | ICD-10-CM

## 2018-08-15 ENCOUNTER — Other Ambulatory Visit: Payer: Self-pay | Admitting: Cardiovascular Disease

## 2018-08-15 ENCOUNTER — Ambulatory Visit: Payer: Medicare Other | Admitting: Cardiovascular Disease

## 2018-08-15 ENCOUNTER — Encounter: Payer: Self-pay | Admitting: Cardiovascular Disease

## 2018-08-15 VITALS — BP 154/80 | HR 65 | Ht 65.0 in | Wt 149.6 lb

## 2018-08-15 DIAGNOSIS — I480 Paroxysmal atrial fibrillation: Secondary | ICD-10-CM | POA: Diagnosis not present

## 2018-08-15 DIAGNOSIS — E7800 Pure hypercholesterolemia, unspecified: Secondary | ICD-10-CM

## 2018-08-15 DIAGNOSIS — I10 Essential (primary) hypertension: Secondary | ICD-10-CM

## 2018-08-15 DIAGNOSIS — R609 Edema, unspecified: Secondary | ICD-10-CM | POA: Diagnosis not present

## 2018-08-15 DIAGNOSIS — I6523 Occlusion and stenosis of bilateral carotid arteries: Secondary | ICD-10-CM

## 2018-08-15 DIAGNOSIS — I119 Hypertensive heart disease without heart failure: Secondary | ICD-10-CM

## 2018-08-15 NOTE — Patient Instructions (Signed)
Medication Instructions:  Ok to use Zyrtec as needed for allergies  If you need a refill on your cardiac medications before your next appointment, please call your pharmacy.   Lab work: NONE   Testing/Procedures: Your physician has requested that you have an echocardiogram. Echocardiography is a painless test that uses sound waves to create images of your heart. It provides your doctor with information about the size and shape of your heart and how well your heart's chambers and valves are working. This procedure takes approximately one hour. There are no restrictions for this procedure. [address]  Follow-Up: At [facility], you and your health needs are our priority.  As part of our continuing mission to provide you with exceptional heart care, we have created designated Provider Care Teams.  These Care Teams include your primary Cardiologist (physician) and Advanced Practice Providers (APPs -  Physician Assistants and Nurse Practitioners) who all work together to provide you with the care you need, when you need it. You will need a follow up appointment in 6 months.  Please call our office 2 months in advance to schedule this appointment.  You may see DR [facility] or one of the following Advanced Practice Providers on your designated Care Team:   Kerin Ransom, PA-C Roby Lofts, [place] . Sande Rives, PA-C

## 2018-08-15 NOTE — Progress Notes (Signed)
Cardiology Office Note  Date:  08/15/2018   ID:  Robin, [name] 04-11-1935, MRN [id number]  PCP:  [name], PA-C  Cardiologist:   Skeet Latch, MD   No chief complaint on file.    History of Present Illness: Robin [name] is a 84 y.o. female with hypertension, paroxysmal atrial fibrillation, hyperlipidemia and mild carotid stenosis who presents for follow-up.  [name] is a former patient of Dr. Mare Ferrari. She had a heart cath in 2009 that she reports was normal.  Carotid Doppler 08/2013 revealed <50% stenosis bilaterally. She reported recurrent palpitations and wore a 30 day event monitor 10/2015 that showed occasional short episodes of atrial fibrillation.  She has been managed on Eliquis, diltiazem and metoprolol.  She is not interested in statins.  [name] reported episodes of near syncope.  She noted that her heart was irregular but in clinic she was found to have a wandering pacemaker.  She was referred for a Lexiscan Myoview which revealed LVEF 81% and no ischemia.  Since that time she took a trip to the [place] and had a recurrent episode.  She discovered that she was actually having vertigo.  Since her last appointment [name] has been doing well.  She continues to have occasional episodes of vertigo.  She denies any recurrent syncope.  She has occasional lower extremity edema and one leg.  She takes an extra half tablet of HCTZ and this seems to help.  She also notes increased shortness of breath when she tries to walk upstairs.  She has no exertional chest pain or pressure.  She also denies orthopnea or PND.  She has been struggling with some memory loss.  Her pharmacist recommended that she try taking Prevagen, but she wanted to make sure it is safe with her medications.  She continues to check her blood pressure regularly at home.  It typically is around 235 systolic.   Past Medical History:  Diagnosis Date  . Anxiety   . AV bloc first degree   . Carotid  stenosis 03/02/2017  . Hyperlipidemia   . Hypertension   . Lumbar pain    fell off of her horse and has low back pain at times  . Venous insufficiency of both lower extremities   . Vertigo     Past Surgical History:  Procedure Laterality Date  . ABDOMINAL HYSTERECTOMY    . BREAST BIOPSY Left   . BREAST EXCISIONAL BIOPSY Right   . BREAST LUMPECTOMY WITH RADIOACTIVE SEED LOCALIZATION Left 05/05/2015   Procedure: LEFT BREAST LUMPECTOMY WITH RADIOACTIVE SEED LOCALIZATION;  Surgeon: Autumn Messing III, MD;  Location: [place];  Service: General;  Laterality: Left;  . CHOLECYSTECTOMY    . EYE SURGERY     bil cataract surgery  . LUNG SURGERY       Current Outpatient Medications  Medication Sig Dispense Refill  . BILBERRY-BIOFLAV-RUTIN-QUERCET PO Take 1,000 mg by mouth.    . cetirizine (ZYRTEC) 10 MG tablet Take 10 mg by mouth as needed for allergies.    . Cholecalciferol (VITAMIN D PO) Take 2,400 mg by mouth daily.      Marland Kitchen diltiazem (TIAZAC) 180 MG 24 hr capsule TAKE (1) CAPSULE DAILY. 90 capsule 0  . diphenoxylate-atropine (LOMOTIL) 2.5-0.025 MG per tablet Take 1 tablet by mouth daily as needed (for diarrhea).     Arne Cleveland 5 MG TABS tablet TAKE 1 TABLET BY MOUTH TWICE DAILY. 180 tablet 1  . estradiol (ESTRACE)  0.5 MG tablet Take 0.25 mg by mouth 2 (two) times a week.     . Lactobacillus Rhamnosus, GG, (CULTURELLE PO) Take 1 capsule by mouth every morning.    . Lutein 20 MG CAPS Take 1 capsule by mouth daily.     . metoprolol (TOPROL-XL) 100 MG 24 hr tablet TAKE 1/2 TABLET BY MOUTH IN THE MORNING AND 3/4th TABLET IN THE EVENING BY MOUTH DAILY    . Multiple Vitamins-Minerals (HAIR SKIN NAILS PO) Take 1 capsule by mouth daily.    . potassium chloride SA (K-DUR,KLOR-CON) 20 MEQ tablet TAKE 1 TABLET TWICE DAILY. (Patient taking differently: daily. ) 180 tablet 11  . triamcinolone (NASACORT ALLERGY 24HR) 55 MCG/ACT AERO nasal inhaler Place 2 sprays into the nose daily.    Marland Kitchen  triamcinolone cream (KENALOG) 0.1 % Apply 1 application topically daily as needed (for rash).     . Triamterene-HCTZ (DYAZIDE PO) Take 25 mg by mouth daily.      . vitamin A 10000 UNIT capsule Take 10,000 Units by mouth daily.     No current facility-administered medications for this visit.     Allergies:   Iodinated diagnostic agents; Amlodipine; Codeine; Diphenhydramine; Diphenhydramine hcl; Pravastatin; Sulfa antibiotics; and Sulfa drugs cross reactors    Social History:  The patient  reports that she has never smoked. She has never used smokeless tobacco. She reports current alcohol use. She reports that she does not use drugs.   Family History:  The patient's family history includes Cancer in her sister; Heart disease in her father and mother; Hypertension in her brother, mother, sister, sister, and sister; Stroke in her mother.    ROS:  Please see the history of present illness.   Otherwise, review of systems are positive for back pain.   All other systems are reviewed and negative.    PHYSICAL EXAM: VS:  BP (!) 154/80   Pulse 65   Ht 5\' 5"  (1.651 m)   Wt 149 lb 9.6 oz (67.9 kg)   BMI 24.89 kg/m  , BMI Body mass index is 24.89 kg/m. GENERAL:  Well appearing HEENT: Pupils equal round and reactive, fundi not visualized, oral mucosa unremarkable NECK:  No jugular venous distention, waveform within normal limits, carotid upstroke brisk and symmetric, no bruits LUNGS:  Clear to auscultation bilaterally HEART:  RRR.  PMI not displaced or sustained,S1 and S2 within normal limits, no S3, no S4, no clicks, no rubs, no murmurs ABD:  Flat, positive bowel sounds normal in frequency in pitch, no bruits, no rebound, no guarding, no midline pulsatile mass, no hepatomegaly, no splenomegaly EXT:  2 plus pulses throughout, no edema, no cyanosis no clubbing SKIN:  No rashes no nodules NEURO:  Cranial nerves II through XII grossly intact, motor grossly intact throughout PSYCH:  Cognitively  intact, oriented to person place and time   EKG:  EKG is ordered today. The ekg ordered 11/27/16 demonstrates sinus rhythm rate 68 bpm.  First degree AV block.  02/21/18: Wandering pacemaker.  Rate 67 bpm.  First degree AV block 08/15/18: Atrial fibrillation.  Rate 65 bpm.    Lexiscan Myoview 02/2018:   Nuclear stress EF: 81%.  The left ventricular ejection fraction is hyperdynamic (>65%).  The study is normal.  This is a low risk study.   Low risk stress nuclear study with normal perfusion and normal left ventricular regional and global systolic function.   30 day Event Monitor 11/08/15:  Quality: Fair.  Baseline artifact. Predominant rhythm: sinus  rhythm at atrial fibrillation both noted Sinus rhythm: Average heart rate: 73 bpm Max heart rate: 123 bpm Min heart rate: 46 bpm  Atrial fibrillation: Rates 58-122 bpm.   <1% atrial fibrillation burden.  Carotid Dopplers 11/19/17: 1-39% ICA stenosis bilaterally.  Recent Labs: 02/21/2018: BUN 20; Creatinine, Ser 0.82; Hemoglobin 13.7; Magnesium 2.0; Platelets 257; Potassium 4.3; Sodium 139; TSH 3.590   07/30/16: Sodium 139, potassium 3.6, BUN 13, creatinine 0.69 AST 14, ALT 15 Total cholesterol 198, triglycerides 131, HDL 63, LDL 109  11/01/17:  Total cholesterol 181, triglycerides 80, HDL 64, LDL 101 Creatinine 0.63 TSH 2.24   Lipid Panel    Component Value Date/Time   CHOL 213 (H) 06/14/2011 0832   TRIG 85.0 06/14/2011 0832   HDL 32.60 (L) 06/14/2011 0832   CHOLHDL 7 06/14/2011 0832   VLDL 17.0 06/14/2011 0832   LDLDIRECT 161.5 06/14/2011 0832      Wt Readings from Last 3 Encounters:  08/15/18 149 lb 9.6 oz (67.9 kg)  03/28/18 148 lb (67.1 kg)  02/27/18 148 lb (67.1 kg)      ASSESSMENT AND PLAN:  # Paroxysmal atrial fibrillation/flutter # PACs: She remains in atrial fibrillation.  Rate is controlled.  Stable on metoprolol, diltiazem and Eliquis.    # LE Edema:  She has known venous insufficiency.   However her symptoms have been worse lately despite wearing compression socks.  She also notes increased exertional dyspnea.  We will get an echo to assess.  # Exertional dyspnea:  Lexiscan Myoview negative for ischemia 02/2018.  Echo as above.  # Hypertension:  # White coat hypertension: Her blood pressure is elevated here as always.  It remains controlled at home. Continue diltiazem, metoprolol, and HCTZ/triamterene.  # Hyperlipidemia: LDL 101 08/2017.  She is uninterested in statins.    # Carotid stenosis: <50% stenosis bilaterally in 08/2013. Mild on Dopplers 11/2017.     Current medicines are reviewed at length with the patient today.  The patient does not have concerns regarding medicines.  The following changes have been made:  no change  Labs/ tests ordered today include:   Orders Placed This Encounter  Procedures  . EKG 12-Lead  . ECHOCARDIOGRAM COMPLETE      Disposition:   FU with Mouna Yager C. Oval Linsey, MD, [facility] in 6 months.      Signed, Trong Gosling C. Oval Linsey, MD, [facility]  08/15/2018 11:30 AM    [place]

## 2018-08-26 ENCOUNTER — Other Ambulatory Visit: Payer: Self-pay | Admitting: Cardiovascular Disease

## 2018-08-28 ENCOUNTER — Ambulatory Visit (HOSPITAL_COMMUNITY): Payer: Medicare Other | Attending: Cardiology

## 2018-08-28 ENCOUNTER — Other Ambulatory Visit: Payer: Self-pay

## 2018-08-28 DIAGNOSIS — R609 Edema, unspecified: Secondary | ICD-10-CM

## 2018-08-28 DIAGNOSIS — I480 Paroxysmal atrial fibrillation: Secondary | ICD-10-CM | POA: Diagnosis present

## 2018-09-04 ENCOUNTER — Telehealth: Payer: Self-pay | Admitting: Cardiovascular Disease

## 2018-09-04 NOTE — Telephone Encounter (Signed)
-----   Message from Skeet Latch, MD sent at 09/01/2018  4:20 PM EDT ----- Echo shows that her heart is squeezing well.  Mild calcification of the mitral and aortic valves.  This is expected with age and is not clinically significant.  Findings do not explain shortness of breath or swelling.  I suspect that her swelling is more related to chronic venous insufficiency of the legs.  Continue wearing compression socks.  She may need a higher strength.  Elevate legs when sitting and limit salt intake.

## 2018-09-04 NOTE — Telephone Encounter (Signed)
Advised patient, verbalized understanding  

## 2018-09-04 NOTE — Telephone Encounter (Signed)
Pt returning Melinda's call from 09/02/18

## 2018-09-19 ENCOUNTER — Ambulatory Visit: Payer: Medicare Other

## 2018-10-03 ENCOUNTER — Other Ambulatory Visit: Payer: Self-pay | Admitting: Cardiovascular Disease

## 2018-11-14 ENCOUNTER — Ambulatory Visit: Payer: Medicare Other

## 2018-12-23 ENCOUNTER — Other Ambulatory Visit: Payer: Self-pay

## 2018-12-23 ENCOUNTER — Ambulatory Visit
Admission: RE | Admit: 2018-12-23 | Discharge: 2018-12-23 | Disposition: A | Discharge status: Home / Self Care | Payer: Medicare Other | Source: Ambulatory Visit | Attending: Family Medicine | Admitting: Family Medicine

## 2018-12-23 DIAGNOSIS — Z1231 Encounter for screening mammogram for malignant neoplasm of breast: Secondary | ICD-10-CM

## 2019-02-12 ENCOUNTER — Telehealth: Payer: Self-pay | Admitting: Radiology

## 2019-02-12 ENCOUNTER — Other Ambulatory Visit: Payer: Self-pay

## 2019-02-12 ENCOUNTER — Ambulatory Visit (INDEPENDENT_AMBULATORY_CARE_PROVIDER_SITE_OTHER): Payer: Medicare Other | Admitting: Cardiology

## 2019-02-12 ENCOUNTER — Encounter: Payer: Self-pay | Admitting: Cardiology

## 2019-02-12 VITALS — BP 158/78 | HR 78 | Temp 97.7°F | Ht 64.0 in | Wt 145.0 lb

## 2019-02-12 DIAGNOSIS — Z7901 Long term (current) use of anticoagulants: Secondary | ICD-10-CM | POA: Insufficient documentation

## 2019-02-12 DIAGNOSIS — I119 Hypertensive heart disease without heart failure: Secondary | ICD-10-CM

## 2019-02-12 DIAGNOSIS — Z0389 Encounter for observation for other suspected diseases and conditions ruled out: Secondary | ICD-10-CM

## 2019-02-12 DIAGNOSIS — I6523 Occlusion and stenosis of bilateral carotid arteries: Secondary | ICD-10-CM

## 2019-02-12 DIAGNOSIS — I480 Paroxysmal atrial fibrillation: Secondary | ICD-10-CM

## 2019-02-12 DIAGNOSIS — IMO0001 Reserved for inherently not codable concepts without codable children: Secondary | ICD-10-CM | POA: Insufficient documentation

## 2019-02-12 DIAGNOSIS — I872 Venous insufficiency (chronic) (peripheral): Secondary | ICD-10-CM | POA: Insufficient documentation

## 2019-02-12 NOTE — Assessment & Plan Note (Signed)
She says she is going to have a procedure for this in the next few weeks

## 2019-02-12 NOTE — Assessment & Plan Note (Signed)
Less than 39% by doppler June 2019

## 2019-02-12 NOTE — Telephone Encounter (Signed)
Enrolled patient for a 14 day Zio monitor to be mailed. Brief instructions were gone over with the patient and she knows to expect the monitor to arrive in 3-4 days.  

## 2019-02-12 NOTE — Patient Instructions (Addendum)
Medication Instructions:  Your physician recommends that you continue on your current medications as directed. Please refer to the Current Medication list given to you today. If you need a refill on your cardiac medications before your next appointment, please call your pharmacy.   Lab work: None  If you have labs (blood work) drawn today and your tests are completely normal, you will receive your results only by: Marland Kitchen MyChart Message (if you have MyChart) OR . A paper copy in the mail If you have any lab test that is abnormal or we need to change your treatment, we will call you to review the results.  Testing/Procedures: Your physician has recommended that you wear a 14 DAY ZIO-PATCH monitor. The Zio patch cardiac monitor continuously records heart rhythm data for up to 14 days, this is for patients being evaluated for multiple types heart rhythms. For the first 24 hours post application, please avoid getting the Zio monitor wet in the shower or by excessive sweating during exercise. After that, feel free to carry on with regular activities. Keep soaps and lotions away from the ZIO XT Patch.  This will be placed at our [facility] location - [address].         Follow-Up: At [facility], you and your health needs are our priority.  As part of our continuing mission to provide you with exceptional heart care, we have created designated Provider Care Teams.  These Care Teams include your primary Cardiologist (physician) and Advanced Practice Providers (APPs -  Physician Assistants and Nurse Practitioners) who all work together to provide you with the care you need, when you need it. You will need a follow up appointment in 4-6 weeks. You may see Dr Skeet Latch or one of the following Advanced Practice Providers on your designated Care Team:   Kerin Ransom, PA-C Roby Lofts, [place] . Sande Rives, PA-C  Any Other Special Instructions Will Be Listed Below (If  Applicable).

## 2019-02-12 NOTE — Progress Notes (Signed)
Cardiology Office Note:    Date:  02/12/2019   ID:  Robin [name], DOB 04-Oct-1934, MRN [id number]  PCP:  [name], PA-C  Cardiologist:  Dr Oval Linsey Electrophysiologist:  None   Referring MD: [name], PA-C   Chief Complaint  Patient presents with   Follow-up    84m ov-afib    History of Present Illness:    Robin [name] (previously [employer]- she was married in March 2020) is an 84 y.o. female with a hx of remote catheterization showing normal coronaries in 2009, hypertension, venous insufficiency, mild carotid disease, and PAF.  She had PAF documented in 2017.  She has been on Eliquis.  Echo March 2020 showed an EF of 55% with mild left atrial enlargement.  Myoview in September 2018 was low risk. Previously it was felt the patient was asymptomatic when in AF.  The patient last saw Dr. Oval Linsey in February 2020 and is here today for routine follow-up.  The patient is done well since we saw her last.  She did relate to me that she thinks she can tell when she is in normal sinus rhythm.  She said she woke up earlier this week and felt "great".  She took her heart rate and it was regular at 60.  EKG from September 2019 showed her to be in sinus rhythm with PACs.  EKG from February 2020 showed her to be in atrial fibrillation with controlled ventricular response.  Today she is in atrial fibrillation with a rate of 78.  Past Medical History:  Diagnosis Date   Anxiety    AV bloc first degree    Carotid stenosis 03/02/2017   Hyperlipidemia    Hypertension    Lumbar pain    fell off of her horse and has low back pain at times   Venous insufficiency of both lower extremities    Vertigo     Past Surgical History:  Procedure Laterality Date   ABDOMINAL HYSTERECTOMY     BREAST BIOPSY Left    BREAST EXCISIONAL BIOPSY Right    BREAST LUMPECTOMY WITH RADIOACTIVE SEED LOCALIZATION Left 05/05/2015   Procedure: LEFT BREAST LUMPECTOMY WITH  RADIOACTIVE SEED LOCALIZATION;  Surgeon: Autumn Messing III, MD;  Location: [place];  Service: General;  Laterality: Left;   CHOLECYSTECTOMY     EYE SURGERY     bil cataract surgery   LUNG SURGERY      Current Medications: Current Meds  Medication Sig   BILBERRY-BIOFLAV-RUTIN-QUERCET PO Take 1,000 mg by mouth.   Cholecalciferol (VITAMIN D PO) Take 2,400 mg by mouth daily.     diltiazem (TIAZAC) 180 MG 24 hr capsule TAKE (1) CAPSULE DAILY.   diphenoxylate-atropine (LOMOTIL) 2.5-0.025 MG per tablet Take 1 tablet by mouth daily as needed (for diarrhea).    ELIQUIS 5 MG TABS tablet TAKE 1 TABLET BY MOUTH TWICE DAILY.   estradiol (ESTRACE) 0.5 MG tablet Take 0.25 mg by mouth 2 (two) times a week.    Lactobacillus Rhamnosus, GG, (CULTURELLE PO) Take 1 capsule by mouth every morning.   Lutein 20 MG CAPS Take 1 capsule by mouth daily.    metoprolol (TOPROL-XL) 100 MG 24 hr tablet TAKE 1/2 TABLET BY MOUTH IN THE MORNING AND 1TABLET IN THE EVENING BY MOUTH DAILY   Multiple Vitamins-Minerals (HAIR SKIN NAILS PO) Take 1 capsule by mouth daily.   potassium chloride SA (K-DUR) 20 MEQ tablet Take 20 mEq by mouth daily.   triamcinolone (NASACORT ALLERGY 24HR) 55  MCG/ACT AERO nasal inhaler Place 2 sprays into the nose daily.   triamcinolone cream (KENALOG) 0.1 % Apply 1 application topically daily as needed (for rash).    Triamterene-HCTZ (DYAZIDE PO) Take 25 mg by mouth daily.     vitamin A 10000 UNIT capsule Take 10,000 Units by mouth daily.     Allergies:   Iodinated diagnostic agents, Amlodipine, Codeine, Diphenhydramine, Diphenhydramine hcl, Pravastatin, Sulfa antibiotics, and Sulfa drugs cross reactors   Social History   Socioeconomic History   Marital status: Married    Spouse name: Not on file   Number of children: Not on file   Years of education: Not on file   Highest education level: Not on file  Occupational History   Not on file  Social Needs    Financial resource strain: Not on file   Food insecurity    Worry: Not on file    Inability: Not on file   Transportation needs    Medical: Not on file    Non-medical: Not on file  Tobacco Use   Smoking status: Never Smoker   Smokeless tobacco: Never Used  Substance and Sexual Activity   Alcohol use: Yes    Comment: social   Drug use: No   Sexual activity: Not on file  Lifestyle   Physical activity    Days per week: Not on file    Minutes per session: Not on file   Stress: Not on file  Relationships   Social connections    Talks on phone: Not on file    Gets together: Not on file    Attends religious service: Not on file    Active member of club or organization: Not on file    Attends meetings of clubs or organizations: Not on file    Relationship status: Not on file  Other Topics Concern   Not on file  Social History Narrative   Not on file     Family History: The patient's family history includes Cancer in her sister; Heart disease in her father and mother; Hypertension in her brother, mother, sister, sister, and sister; Stroke in her mother. There is no history of Breast cancer.  ROS:   Please see the history of present illness.    Chronic venous edema. She has a procedure scheduled in the next couple of weeks.  All other systems reviewed and are negative.  EKGs/Labs/Other Studies Reviewed:    The following studies were reviewed today: Echo March 2020 Myoview Sept 2019  EKG:  EKG is ordered today.  The ekg ordered today demonstrates AF with VR 78  Recent Labs: 02/21/2018: BUN 20; Creatinine, Ser 0.82; Hemoglobin 13.7; Magnesium 2.0; Platelets 257; Potassium 4.3; Sodium 139; TSH 3.590  Recent Lipid Panel    Component Value Date/Time   CHOL 213 (H) 06/14/2011 0832   TRIG 85.0 06/14/2011 0832   HDL 32.60 (L) 06/14/2011 0832   CHOLHDL 7 06/14/2011 0832   VLDL 17.0 06/14/2011 0832   LDLDIRECT 161.5 06/14/2011 0832    Physical Exam:    VS:  BP  (!) 158/78    Pulse 78    Temp 97.7 F (36.5 C) (Temporal)    Ht 5\' 4"  (1.626 m)    Wt 145 lb (65.8 kg)    SpO2 94%    BMI 24.89 kg/m     Wt Readings from Last 3 Encounters:  02/12/19 145 lb (65.8 kg)  08/15/18 149 lb 9.6 oz (67.9 kg)  03/28/18 148 lb (67.1 kg)  GEN:  Well nourished, well developed in no acute distress HEENT: Normal NECK: No JVD; No carotid bruits LYMPHATICS: No lymphadenopathy CARDIAC: irregularly irregular, no murmurs, rubs, gallops RESPIRATORY:  Clear to auscultation without rales, wheezing or rhonchi  ABDOMEN: Soft, non-tender, non-distended MUSCULOSKELETAL:  No edema; Compression stockings in place. No deformity  SKIN: Warm and dry NEUROLOGIC:  Alert and oriented x 3 PSYCHIATRIC:  Normal affect   ASSESSMENT:    Paroxysmal atrial fibrillation (HCC) Previously thought to be asymptomatic but she now says she can tell when she is in NSR and she feels better when in NSR  Chronic anticoagulation CHADS VASC=5- on Eliquis  Benign hypertensive heart disease without heart failure Fair control- white coat HTN  Carotid stenosis Less than 39% by doppler June 2019  Venous insufficiency (chronic) (peripheral) She says she is going to have a procedure for this in the next few weeks  PLAN:    I suggested a two week ZIO to get an idea of how often she goes into NSR and if she can corollate her change in symptoms to being in NSR.  I'll arrange for f/u with Dr Oval Linsey in 4-6 weeks.    Medication Adjustments/Labs and Tests Ordered: Current medicines are reviewed at length with the patient today.  Concerns regarding medicines are outlined above.  Orders Placed This Encounter  Procedures   LONG TERM MONITOR (3-14 DAYS)   EKG 12-Lead   No orders of the defined types were placed in this encounter.   Patient Instructions  Medication Instructions:  Your physician recommends that you continue on your current medications as directed. Please refer to the Current  Medication list given to you today. If you need a refill on your cardiac medications before your next appointment, please call your pharmacy.   Lab work: None  If you have labs (blood work) drawn today and your tests are completely normal, you will receive your results only by:  [place] (if you have MyChart) OR  A paper copy in the mail If you have any lab test that is abnormal or we need to change your treatment, we will call you to review the results.  Testing/Procedures: Your physician has recommended that you wear a 14 DAY ZIO-PATCH monitor. The Zio patch cardiac monitor continuously records heart rhythm data for up to 14 days, this is for patients being evaluated for multiple types heart rhythms. For the first 24 hours post application, please avoid getting the Zio monitor wet in the shower or by excessive sweating during exercise. After that, feel free to carry on with regular activities. Keep soaps and lotions away from the ZIO XT Patch.  This will be placed at our [facility] location - [address].         Follow-Up: At [facility], you and your health needs are our priority.  As part of our continuing mission to provide you with exceptional heart care, we have created designated Provider Care Teams.  These Care Teams include your primary Cardiologist (physician) and Advanced Practice Providers (APPs -  Physician Assistants and Nurse Practitioners) who all work together to provide you with the care you need, when you need it. You will need a follow up appointment in 4-6 weeks. You may see Dr Skeet Latch or one of the following Advanced Practice Providers on your designated Care Team:   Kerin Ransom, PA-C Roby Lofts, PA-C  Sande Rives, [place]  Any Other Special Instructions Will Be Listed Below (If  Applicable).       Angelena Form, PA-C  02/12/2019 12:14 PM    [facility]

## 2019-02-12 NOTE — Assessment & Plan Note (Signed)
Previously thought to be asymptomatic but she now says she can tell when she is in NSR and she feels better when in NSR

## 2019-02-12 NOTE — Assessment & Plan Note (Signed)
Fair control- white coat HTN

## 2019-02-12 NOTE — Assessment & Plan Note (Signed)
CHADS VASC=5- on Eliquis 

## 2019-02-17 ENCOUNTER — Ambulatory Visit (INDEPENDENT_AMBULATORY_CARE_PROVIDER_SITE_OTHER): Payer: Medicare Other

## 2019-02-17 DIAGNOSIS — I480 Paroxysmal atrial fibrillation: Secondary | ICD-10-CM | POA: Diagnosis not present

## 2019-03-26 ENCOUNTER — Telehealth: Payer: Self-pay

## 2019-03-26 ENCOUNTER — Telehealth (INDEPENDENT_AMBULATORY_CARE_PROVIDER_SITE_OTHER): Payer: Medicare Other | Admitting: Cardiology

## 2019-03-26 ENCOUNTER — Encounter: Payer: Self-pay | Admitting: Cardiology

## 2019-03-26 VITALS — Ht 65.0 in | Wt 144.0 lb

## 2019-03-26 DIAGNOSIS — I6523 Occlusion and stenosis of bilateral carotid arteries: Secondary | ICD-10-CM | POA: Diagnosis not present

## 2019-03-26 DIAGNOSIS — I480 Paroxysmal atrial fibrillation: Secondary | ICD-10-CM

## 2019-03-26 DIAGNOSIS — Z7901 Long term (current) use of anticoagulants: Secondary | ICD-10-CM

## 2019-03-26 MED ORDER — METOPROLOL SUCCINATE ER 100 MG PO TB24: ORAL_TABLET | ORAL | 0 refills | Status: DC

## 2019-03-26 NOTE — Telephone Encounter (Signed)

## 2019-03-26 NOTE — Progress Notes (Signed)
Virtual Visit via Telephone Note   This visit type was conducted due to national recommendations for restrictions regarding the COVID-19 Pandemic (e.g. social distancing) in an effort to limit this patient's exposure and mitigate transmission in our community.  Due to her co-morbid illnesses, this patient is at least at moderate risk for complications without adequate follow up.  This format is felt to be most appropriate for this patient at this time.  The patient did not have access to video technology/had technical difficulties with video requiring transitioning to audio format only (telephone).  All issues noted in this document were discussed and addressed.  No physical exam could be performed with this format.  Please refer to the patient's chart for her  consent to telehealth for [facility].   Date:  03/26/2019   ID:  HANG ORTEGA, DOB Apr 10, 1935, MRN [id number]  Patient Location: Home Provider Location: Office  PCP:  [name], PA-C  Cardiologist:  Skeet Latch, MD  Electrophysiologist:  None   Evaluation Performed:  Follow-Up Visit  Chief Complaint:  none  History of Present Illness:    Robin [name] is a pleasant 84 y.o. female with a hx of PAF.  She was seen in the office 02/12/2019 with c/o intermittent palpitations. A ZIO monitor was obtained which showed atrial fibrillation with VR 30's to 100.  I called the patient today for follow up.  She is doing well, "I feel pretty good".  No syncope or pre syncope. She takes Diltiazem 180 mg and Toprol 50 mg TID.   The patient does not have symptoms concerning for COVID-19 infection (fever, chills, cough, or new shortness of breath).    Past Medical History:  Diagnosis Date  . Anxiety   . AV bloc first degree   . Carotid stenosis 03/02/2017  . Hyperlipidemia   . Hypertension   . Lumbar pain    fell off of her horse and has low back pain at times  . Venous insufficiency of both lower extremities   .  Vertigo    Past Surgical History:  Procedure Laterality Date  . ABDOMINAL HYSTERECTOMY    . BREAST BIOPSY Left   . BREAST EXCISIONAL BIOPSY Right   . BREAST LUMPECTOMY WITH RADIOACTIVE SEED LOCALIZATION Left 05/05/2015   Procedure: LEFT BREAST LUMPECTOMY WITH RADIOACTIVE SEED LOCALIZATION;  Surgeon: Autumn Messing III, MD;  Location: [place];  Service: General;  Laterality: Left;  . CHOLECYSTECTOMY    . EYE SURGERY     bil cataract surgery  . LUNG SURGERY       Current Meds  Medication Sig  . BILBERRY-BIOFLAV-RUTIN-QUERCET PO Take 1,000 mg by mouth.  . Cholecalciferol (VITAMIN D PO) Take 2,400 mg by mouth daily.    Marland Kitchen diltiazem (TIAZAC) 180 MG 24 hr capsule TAKE (1) CAPSULE DAILY.  . diphenoxylate-atropine (LOMOTIL) 2.5-0.025 MG per tablet Take 1 tablet by mouth daily as needed (for diarrhea).   Arne Cleveland 5 MG TABS tablet TAKE 1 TABLET BY MOUTH TWICE DAILY.  Marland Kitchen estradiol (ESTRACE) 0.5 MG tablet Take 0.25 mg by mouth 2 (two) times a week.   . Lactobacillus Rhamnosus, GG, (CULTURELLE PO) Take 1 capsule by mouth every morning.  . Lutein 20 MG CAPS Take 1 capsule by mouth daily.   . metoprolol (TOPROL-XL) 100 MG 24 hr tablet TAKE 1/2 TABLET BY MOUTH IN THE MORNING AND 1TABLET IN THE EVENING BY MOUTH DAILY  . Multiple Vitamins-Minerals (HAIR SKIN NAILS PO) Take 1 capsule by  mouth daily.  . potassium chloride SA (K-DUR) 20 MEQ tablet Take 20 mEq by mouth daily.  Marland Kitchen triamcinolone cream (KENALOG) 0.1 % Apply 1 application topically daily as needed (for rash).   . Triamterene-HCTZ (DYAZIDE PO) Take 25 mg by mouth daily.    . vitamin A 10000 UNIT capsule Take 10,000 Units by mouth daily.     Allergies:   Iodinated diagnostic agents, Amlodipine, Codeine, Diphenhydramine, Diphenhydramine hcl, Pravastatin, Sulfa antibiotics, and Sulfa drugs cross reactors   Social History   Tobacco Use  . Smoking status: Never Smoker  . Smokeless tobacco: Never Used  Substance Use Topics  .  Alcohol use: Yes    Comment: social  . Drug use: No     Family Hx: The patient's family history includes Cancer in her sister; Heart disease in her father and mother; Hypertension in her brother, mother, sister, sister, and sister; Stroke in her mother. There is no history of Breast cancer.  ROS:   Please see the history of present illness.    All other systems reviewed and are negative.   Prior CV studies:   The following studies were reviewed today:  ZIO 03/12/2019  Labs/Other Tests and Data Reviewed:    EKG:  No ECG reviewed.  Recent Labs: No results found for requested labs within last 8760 hours.   Recent Lipid Panel Lab Results  Component Value Date/Time   CHOL 213 (H) 06/14/2011 08:32 AM   TRIG 85.0 06/14/2011 08:32 AM   HDL 32.60 (L) 06/14/2011 08:32 AM   CHOLHDL 7 06/14/2011 08:32 AM   LDLDIRECT 161.5 06/14/2011 08:32 AM    Wt Readings from Last 3 Encounters:  03/26/19 144 lb (65.3 kg)  02/12/19 145 lb (65.8 kg)  08/15/18 149 lb 9.6 oz (67.9 kg)     Objective:    Vital Signs:  Ht 5\' 5"  (1.651 m)   Wt 144 lb (65.3 kg)   BMI 23.96 kg/m    VITAL SIGNS:  reviewed  ASSESSMENT & PLAN:    Paroxysmal atrial fibrillation (HCC) Doing well- no most likely in CAF. I am a little concerned about her rate being slow at times.  She is on a fairly stiff dose of Toprol for her age and she is on Diltiazem..  I suggested she decrease her Toprol to 50 mg BID- (skip her HS dose).  F/U in 3 months.   Chronic anticoagulation CHADS VASC=5- on Eliquis  Benign hypertensive heart disease without heart failure Fair control- white coat HTN  Carotid stenosis Less than 39% by doppler June 2019  COVID-19 Education: The signs and symptoms of COVID-19 were discussed with the patient and how to seek care for testing (follow up with PCP or arrange E-visit).  The importance of social distancing was discussed today.  Time:   Today, I have spent 15 minutes with the patient with  telehealth technology discussing the above problems.     Medication Adjustments/Labs and Tests Ordered: Current medicines are reviewed at length with the patient today.  Concerns regarding medicines are outlined above.   Tests Ordered: No orders of the defined types were placed in this encounter.   Medication Changes: No orders of the defined types were placed in this encounter.   Follow Up:  In Person 3 months with Dr Oval Linsey- needs EKG  Signed, Kerin Ransom, PA-C  03/26/2019 11:33 AM    [place]

## 2019-03-26 NOTE — Telephone Encounter (Signed)
Contacted patient to discuss AVS Instructions. Gave patient Luke's recommendations from today's virtual office visit. Informed patient that someone from the scheduling dept will be in contact with them to schedule their follow up appt. Patient voiced understanding and AVS mailed.    

## 2019-03-26 NOTE — Patient Instructions (Addendum)
Medication Instructions:  TAKE METOPROLOL 50MG  TWICE A DAY ONE IN THE MORNING AND ONCE IN THE AFTERNOON. IF YOU HAVE THE 100MG  TABLETS BREAK IN HALF AND TAKE HALF TABLET IN THE MORNING AND HALF IN THE AFTERNOON  If you need a refill on your cardiac medications before your next appointment, please call your pharmacy.   Lab work: NONE  If you have labs (blood work) drawn today and your tests are completely normal, you will receive your results only by: Marland Kitchen MyChart Message (if you have MyChart) OR . A paper copy in the mail If you have any lab test that is abnormal or we need to change your treatment, we will call you to review the results.  Testing/Procedures: NONE   Follow-Up: At [facility], you and your health needs are our priority.  As part of our continuing mission to provide you with exceptional heart care, we have created designated Provider Care Teams.  These Care Teams include your primary Cardiologist (physician) and Advanced Practice Providers (APPs -  Physician Assistants and Nurse Practitioners) who all work together to provide you with the care you need, when you need it. You will need a follow up appointment in 3 months. Please call our office 2 months in advance to schedule this appointment.  You may see Skeet Latch, MD or one of the following Advanced Practice Providers on your designated Care Team:   Kerin Ransom, PA-C Roby Lofts, [place] . Sande Rives, PA-C  Any Other Special Instructions Will Be Listed Below (If Applicable).

## 2019-04-09 ENCOUNTER — Other Ambulatory Visit: Payer: Self-pay | Admitting: Cardiovascular Disease

## 2019-04-09 NOTE — Telephone Encounter (Signed)
Refill request

## 2019-06-18 ENCOUNTER — Ambulatory Visit: Payer: Medicare Other | Attending: Internal Medicine

## 2019-06-18 DIAGNOSIS — Z20822 Contact with and (suspected) exposure to covid-19: Secondary | ICD-10-CM

## 2019-06-20 LAB — NOVEL CORONAVIRUS, NAA: SARS-CoV-2, NAA: NOT DETECTED

## 2019-07-01 ENCOUNTER — Ambulatory Visit: Payer: Medicare PPO | Admitting: Cardiovascular Disease

## 2019-07-01 ENCOUNTER — Other Ambulatory Visit: Payer: Self-pay

## 2019-07-01 VITALS — BP 138/82 | HR 63 | Ht 64.0 in | Wt 142.8 lb

## 2019-07-01 DIAGNOSIS — I5032 Chronic diastolic (congestive) heart failure: Secondary | ICD-10-CM

## 2019-07-01 DIAGNOSIS — I6523 Occlusion and stenosis of bilateral carotid arteries: Secondary | ICD-10-CM

## 2019-07-01 DIAGNOSIS — I4819 Other persistent atrial fibrillation: Secondary | ICD-10-CM

## 2019-07-01 DIAGNOSIS — I10 Essential (primary) hypertension: Secondary | ICD-10-CM

## 2019-07-01 NOTE — Progress Notes (Signed)
Cardiology Office Note  Date:  07/20/2019   ID:  Robin [name], DOB 03/28/1935, MRN [id number]  PCP:  [name], PA-C  Cardiologist:   Skeet Latch, MD   No chief complaint on file.    History of Present Illness: Robin [name] is a 85 y.o. female with hypertension, paroxysmal atrial fibrillation, hyperlipidemia and mild carotid stenosis who presents for follow-up.  Robin [name] is a former patient of Dr. Mare Ferrari. She had a heart cath in 2009 that was normal.  Carotid Doppler 08/2013 revealed <50% stenosis bilaterally. She reported recurrent palpitations and wore a 30 day event monitor 10/2015 that showed occasional short episodes of atrial fibrillation.  She has been managed on Eliquis, diltiazem and metoprolol.  She is not interested in statins.  Robin [name] reported episodes of near syncope.  She noted that her heart was irregular but in clinic she was found to have a wandering pacemaker.  She was referred for a Lexiscan Myoview 02/2018 which revealed LVEF 81% and no ischemia.  Since that time she took a trip to the [place] and had a recurrent episode.  She discovered that she was actually having vertigo.  Since her last appointment Robin [name] got married.  She is in the process of moving.   Her blood pressure has been well-controlled.  She saw Kerin Ransom and reduced the dose of metoprolol.  She has been feeling well.  She is active getting her new house together and has no exertional chest pain or shortness of breath.  She denies lower extremity edema, orthopnea or PND.  She gets injections with [place], which seems to have helped.    Past Medical History:  Diagnosis Date  . Anxiety   . AV bloc first degree   . Carotid stenosis 03/02/2017  . Hyperlipidemia   . Hypertension   . Lumbar pain    fell off of her horse and has low back pain at times  . Venous insufficiency of both lower extremities   . Vertigo     Past Surgical History:  Procedure  Laterality Date  . ABDOMINAL HYSTERECTOMY    . BREAST BIOPSY Left   . BREAST EXCISIONAL BIOPSY Right   . BREAST LUMPECTOMY WITH RADIOACTIVE SEED LOCALIZATION Left 05/05/2015   Procedure: LEFT BREAST LUMPECTOMY WITH RADIOACTIVE SEED LOCALIZATION;  Surgeon: Autumn Messing III, MD;  Location: [place];  Service: General;  Laterality: Left;  . CHOLECYSTECTOMY    . EYE SURGERY     bil cataract surgery  . LUNG SURGERY       Current Outpatient Medications  Medication Sig Dispense Refill  . BILBERRY-BIOFLAV-RUTIN-QUERCET PO Take 1,000 mg by mouth.    . Cholecalciferol (VITAMIN D PO) Take 2,400 mg by mouth daily.      Marland Kitchen diltiazem (TIAZAC) 180 MG 24 hr capsule TAKE (1) CAPSULE DAILY. 90 capsule 3  . diphenoxylate-atropine (LOMOTIL) 2.5-0.025 MG per tablet Take 1 tablet by mouth daily as needed (for diarrhea).     Arne Cleveland 5 MG TABS tablet TAKE 1 TABLET BY MOUTH TWICE DAILY. 180 tablet 3  . estradiol (ESTRACE) 0.5 MG tablet Take 0.25 mg by mouth 2 (two) times a week.     . Lactobacillus Rhamnosus, GG, (CULTURELLE PO) Take 1 capsule by mouth every morning.    . Lutein 20 MG CAPS Take 1 capsule by mouth daily.     . metoprolol succinate (TOPROL-XL) 100 MG 24 hr tablet Take half tablet in the morning and  half tablet in the evening 30 tablet 0  . Multiple Vitamins-Minerals (HAIR SKIN NAILS PO) Take 1 capsule by mouth daily.    . potassium chloride SA (K-DUR) 20 MEQ tablet Take 20 mEq by mouth daily.    Marland Kitchen triamcinolone cream (KENALOG) 0.1 % Apply 1 application topically daily as needed (for rash).     . Triamterene-HCTZ (DYAZIDE PO) Take 25 mg by mouth daily.      . vitamin A 10000 UNIT capsule Take 10,000 Units by mouth daily.     No current facility-administered medications for this visit.    Allergies:   Iodinated diagnostic agents, Amlodipine, Codeine, Diphenhydramine, Diphenhydramine hcl, Pravastatin, Sulfa antibiotics, and Sulfa drugs cross reactors    Social History:  The  patient  reports that she has never smoked. She has never used smokeless tobacco. She reports current alcohol use. She reports that she does not use drugs.   Family History:  The patient's family history includes Cancer in her sister; Heart disease in her father and mother; Hypertension in her brother, mother, sister, sister, and sister; Stroke in her mother.    ROS:  Please see the history of present illness.   Otherwise, review of systems are positive for back pain.   All other systems are reviewed and negative.    PHYSICAL EXAM: VS:  BP 138/82   Pulse 63   Ht 5\' 4"  (1.626 m)   Wt 142 lb 12.8 oz (64.8 kg)   BMI 24.51 kg/m  , BMI Body mass index is 24.51 kg/m. GENERAL:  Well appearing HEENT: Pupils equal round and reactive, fundi not visualized, oral mucosa unremarkable NECK:  No jugular venous distention, waveform within normal limits, carotid upstroke brisk and symmetric, no bruits LUNGS:  Clear to auscultation bilaterally HEART:  Irregularly irregular.  PMI not displaced or sustained,S1 and S2 within normal limits, no S3, no S4, no clicks, no rubs, no murmurs ABD:  Flat, positive bowel sounds normal in frequency in pitch, no bruits, no rebound, no guarding, no midline pulsatile mass, no hepatomegaly, no splenomegaly EXT:  2 plus pulses throughout, no edema, no cyanosis no clubbing SKIN:  No rashes no nodules NEURO:  Cranial nerves II through XII grossly intact, motor grossly intact throughout PSYCH:  Cognitively intact, oriented to person place and time   EKG:  EKG is not ordered today. The ekg ordered 11/27/16 demonstrates sinus rhythm rate 68 bpm.  First degree AV block.  02/21/18: Wandering pacemaker.  Rate 67 bpm.  First degree AV block 08/15/18: Atrial fibrillation.  Rate 65 bpm.    Lexiscan Myoview 02/2018:   Nuclear stress EF: 81%.  The left ventricular ejection fraction is hyperdynamic (>65%).  The study is normal.  This is a low risk study.   Low risk stress  nuclear study with normal perfusion and normal left ventricular regional and global systolic function.   30 day Event Monitor 11/08/15:  Quality: Fair.  Baseline artifact. Predominant rhythm: sinus rhythm at atrial fibrillation both noted Sinus rhythm: Average heart rate: 73 bpm Max heart rate: 123 bpm Min heart rate: 46 bpm  Atrial fibrillation: Rates 58-122 bpm.   <1% atrial fibrillation burden.  Carotid Dopplers 11/19/17: 1-39% ICA stenosis bilaterally.  Recent Labs: No results found for requested labs within last 8760 hours.   07/30/16: Sodium 139, potassium 3.6, BUN 13, creatinine 0.69 AST 14, ALT 15 Total cholesterol 198, triglycerides 131, HDL 63, LDL 109  11/01/17:  Total cholesterol 181, triglycerides 80, HDL 64, LDL 101  Creatinine 0.63 TSH 2.24   Lipid Panel    Component Value Date/Time   CHOL 213 (H) 06/14/2011 0832   TRIG 85.0 06/14/2011 0832   HDL 32.60 (L) 06/14/2011 0832   CHOLHDL 7 06/14/2011 0832   VLDL 17.0 06/14/2011 0832   LDLDIRECT 161.5 06/14/2011 0832      Wt Readings from Last 3 Encounters:  07/01/19 142 lb 12.8 oz (64.8 kg)  03/26/19 144 lb (65.3 kg)  02/12/19 145 lb (65.8 kg)      ASSESSMENT AND PLAN:  # Paroxysmal atrial fibrillation/flutter # PACs: She remains in atrial fibrillation.  Rate is controlled and she is asymptomatic.  Stable on metoprolol, diltiazem and Eliquis.    # LE Edema:  Symtpoms have improved with HCTZ.  # Exertional dyspnea:  Lexiscan Myoview negative for ischemia 02/2018.  Echo as above.  # Hypertension:  # White coat hypertension: Her blood pressure is elevated here as always.  It remains controlled at home. Continue diltiazem, metoprolol, and HCTZ/triamterene.  # Hyperlipidemia: LDL 101 08/2017.  She is uninterested in statins.  Will discuss PCSK9 inhibitors and bempidoic acid at follow up.  # Carotid stenosis: <50% stenosis bilaterally in 08/2013. Mild on Dopplers 11/2017.     Current medicines are  reviewed at length with the patient today.  The patient does not have concerns regarding medicines.  The following changes have been made:  no change  Labs/ tests ordered today include:   No orders of the defined types were placed in this encounter.     Disposition:   FU with Marshell Dilauro C. Oval Linsey, MD, [facility] in 1 year   Signed, Katrinia Straker C. Oval Linsey, MD, [facility]  07/20/2019 9:19 AM    [place]

## 2019-07-01 NOTE — Patient Instructions (Addendum)
Medication Instructions:  Your physician recommends that you continue on your current medications as directed. Please refer to the Current Medication list given to you today.  *If you need a refill on your cardiac medications before your next appointment, please call your pharmacy*  Lab Work: NONE  Testing/Procedures: NONE   Follow-Up: At [facility], you and your health needs are our priority.  As part of our continuing mission to provide you with exceptional heart care, we have created designated Provider Care Teams.  These Care Teams include your primary Cardiologist (physician) and Advanced Practice Providers (APPs -  Physician Assistants and Nurse Practitioners) who all work together to provide you with the care you need, when you need it.  Your next appointment:   12 month(s)  The format for your next appointment:   In Person  Provider:   You may see Skeet Latch, MD or one of the following Advanced Practice Providers on your designated Care Team:    Kerin Ransom, PA-C  [place], [place]  Coletta Memos, [place]   COVID-19 Vaccine Information can be found at: [email or web address] For questions related to vaccine distribution or appointments, please email [email or web address] [email or web address] or call [phone].

## 2019-07-08 ENCOUNTER — Ambulatory Visit: Payer: Medicare PPO | Attending: Internal Medicine

## 2019-07-08 DIAGNOSIS — Z23 Encounter for immunization: Secondary | ICD-10-CM | POA: Insufficient documentation

## 2019-07-08 NOTE — Progress Notes (Signed)
   Covid-19 Vaccination Clinic  Name:  Robin [name]    MRN: [id number] DOB: 12-Apr-1935  07/08/2019  Robin [name] was observed post Covid-19 immunization for 15 minutes without incidence. She was provided with Vaccine Information Sheet and instruction to access the V-Safe system.   Robin [name] was instructed to call 911 with any severe reactions post vaccine: Marland Kitchen Difficulty breathing  . Swelling of your face and throat  . A fast heartbeat  . A bad rash all over your body  . Dizziness and weakness    Immunizations Administered    Name Date Dose VIS Date Route   Pfizer COVID-19 Vaccine 07/08/2019  2:34 PM 0.3 mL 05/29/2019 Intramuscular   Manufacturer: [place]   Lot: [id number]   [place]: [id number]

## 2019-07-20 ENCOUNTER — Encounter: Payer: Self-pay | Admitting: Cardiovascular Disease

## 2019-07-29 ENCOUNTER — Ambulatory Visit: Payer: Medicare PPO | Attending: Internal Medicine

## 2019-07-29 DIAGNOSIS — Z23 Encounter for immunization: Secondary | ICD-10-CM

## 2019-07-29 NOTE — Progress Notes (Signed)
   Covid-19 Vaccination Clinic  Name:  Robin [name]    MRN: [id number] DOB: 1935-02-10  07/29/2019  [name] was observed post Covid-19 immunization for 15 minutes without incidence. She was provided with Vaccine Information Sheet and instruction to access the V-Safe system.   [name] was instructed to call 911 with any severe reactions post vaccine: Marland Kitchen Difficulty breathing  . Swelling of your face and throat  . A fast heartbeat  . A bad rash all over your body  . Dizziness and weakness    Immunizations Administered    Name Date Dose VIS Date Route   Pfizer COVID-19 Vaccine 07/29/2019 10:14 AM 0.3 mL 05/29/2019 Intramuscular   Manufacturer: [employer], [employer]   Lot: [id number]   [place]: [id number]

## 2019-08-25 ENCOUNTER — Other Ambulatory Visit: Payer: Self-pay | Admitting: Cardiovascular Disease

## 2019-10-15 ENCOUNTER — Other Ambulatory Visit: Payer: Self-pay | Admitting: Family Medicine

## 2019-10-15 DIAGNOSIS — Z1231 Encounter for screening mammogram for malignant neoplasm of breast: Secondary | ICD-10-CM

## 2019-10-15 DIAGNOSIS — E2839 Other primary ovarian failure: Secondary | ICD-10-CM

## 2019-10-29 ENCOUNTER — Other Ambulatory Visit: Payer: Self-pay | Admitting: Cardiovascular Disease

## 2019-11-28 ENCOUNTER — Other Ambulatory Visit: Payer: Self-pay | Admitting: Cardiovascular Disease

## 2019-11-30 DIAGNOSIS — C44629 Squamous cell carcinoma of skin of left upper limb, including shoulder: Secondary | ICD-10-CM | POA: Diagnosis not present

## 2019-11-30 DIAGNOSIS — C44519 Basal cell carcinoma of skin of other part of trunk: Secondary | ICD-10-CM | POA: Diagnosis not present

## 2019-11-30 DIAGNOSIS — L905 Scar conditions and fibrosis of skin: Secondary | ICD-10-CM | POA: Diagnosis not present

## 2019-11-30 DIAGNOSIS — Z85828 Personal history of other malignant neoplasm of skin: Secondary | ICD-10-CM | POA: Diagnosis not present

## 2019-11-30 DIAGNOSIS — D485 Neoplasm of uncertain behavior of skin: Secondary | ICD-10-CM | POA: Diagnosis not present

## 2019-11-30 DIAGNOSIS — L570 Actinic keratosis: Secondary | ICD-10-CM | POA: Diagnosis not present

## 2019-12-15 DIAGNOSIS — C44629 Squamous cell carcinoma of skin of left upper limb, including shoulder: Secondary | ICD-10-CM | POA: Diagnosis not present

## 2019-12-15 DIAGNOSIS — C44519 Basal cell carcinoma of skin of other part of trunk: Secondary | ICD-10-CM | POA: Diagnosis not present

## 2019-12-31 ENCOUNTER — Other Ambulatory Visit: Payer: Medicare PPO

## 2019-12-31 ENCOUNTER — Ambulatory Visit: Payer: Medicare PPO

## 2020-01-25 ENCOUNTER — Ambulatory Visit
Admission: RE | Admit: 2020-01-25 | Discharge: 2020-01-25 | Disposition: A | Discharge status: Home / Self Care | Payer: Medicare PPO | Source: Ambulatory Visit | Attending: Family Medicine | Admitting: Family Medicine

## 2020-01-25 ENCOUNTER — Other Ambulatory Visit: Payer: Self-pay

## 2020-01-25 DIAGNOSIS — M85851 Other specified disorders of bone density and structure, right thigh: Secondary | ICD-10-CM | POA: Diagnosis not present

## 2020-01-25 DIAGNOSIS — Z1231 Encounter for screening mammogram for malignant neoplasm of breast: Secondary | ICD-10-CM | POA: Diagnosis not present

## 2020-01-25 DIAGNOSIS — Z780 Asymptomatic menopausal state: Secondary | ICD-10-CM | POA: Diagnosis not present

## 2020-01-25 DIAGNOSIS — E2839 Other primary ovarian failure: Secondary | ICD-10-CM

## 2020-03-09 ENCOUNTER — Other Ambulatory Visit: Payer: Self-pay

## 2020-03-09 ENCOUNTER — Encounter: Payer: Self-pay | Admitting: Cardiovascular Disease

## 2020-03-09 ENCOUNTER — Ambulatory Visit: Payer: Medicare PPO | Admitting: Cardiovascular Disease

## 2020-03-09 VITALS — BP 152/81 | HR 81 | Ht 64.0 in | Wt 146.2 lb

## 2020-03-09 DIAGNOSIS — I4819 Other persistent atrial fibrillation: Secondary | ICD-10-CM | POA: Diagnosis not present

## 2020-03-09 DIAGNOSIS — I491 Atrial premature depolarization: Secondary | ICD-10-CM | POA: Diagnosis not present

## 2020-03-09 DIAGNOSIS — Z7901 Long term (current) use of anticoagulants: Secondary | ICD-10-CM | POA: Diagnosis not present

## 2020-03-09 DIAGNOSIS — I480 Paroxysmal atrial fibrillation: Secondary | ICD-10-CM

## 2020-03-09 DIAGNOSIS — I119 Hypertensive heart disease without heart failure: Secondary | ICD-10-CM

## 2020-03-09 DIAGNOSIS — I6523 Occlusion and stenosis of bilateral carotid arteries: Secondary | ICD-10-CM

## 2020-03-09 DIAGNOSIS — E7800 Pure hypercholesterolemia, unspecified: Secondary | ICD-10-CM | POA: Diagnosis not present

## 2020-03-09 NOTE — Patient Instructions (Signed)
Medication Instructions:  CONSIDER NEXLETOL, REPATHA, OR PRALUENT  *If you need a refill on your cardiac medications before your next appointment, please call your pharmacy*  Lab Work: NONE   Testing/Procedures: NONE  Follow-Up: At [facility], you and your health needs are our priority.  As part of our continuing mission to provide you with exceptional heart care, we have created designated Provider Care Teams.  These Care Teams include your primary Cardiologist (physician) and Advanced Practice Providers (APPs -  Physician Assistants and Nurse Practitioners) who all work together to provide you with the care you need, when you need it.  We recommend signing up for the patient portal called "MyChart".  Sign up information is provided on this After Visit Summary.  MyChart is used to connect with patients for Virtual Visits (Telemedicine).  Patients are able to view lab/test results, encounter notes, upcoming appointments, etc.  Non-urgent messages can be sent to your provider as well.   To learn more about what you can do with MyChart, go to [email or web address].    Your next appointment:   12 month(s)  You will receive a reminder letter in the mail two months in advance. If you don't receive a letter, please call our office to schedule the follow-up appointment.  The format for your next appointment:   In Person  Provider:   You may see Skeet Latch, MD or one of the following Advanced Practice Providers on your designated Care Team:    Kerin Ransom, PA-C  [place], [place]  Coletta Memos, [place]  Other Instructions  MONITOR YOUR BLOOD PRESSURE AT HOME, CALL THE OFFICE IF IT IS NOT CONSISTENTLY BELOW 130/80

## 2020-03-09 NOTE — Progress Notes (Signed)
Cardiology Office Note  Date:  03/09/2020   ID:  Robin [name], DOB Oct 20, 1934, MRN [id number]  PCP:  Robin Stalker, PA-C  Cardiologist:   Robin Latch, MD   No chief complaint on file.    History of Present Illness: Robin [name] is a 85 y.o. female with hypertension, paroxysmal atrial fibrillation, hyperlipidemia and mild carotid stenosis who presents for follow-up.  Robin [name] is a former patient of Robin [name]. She had a heart cath in 2009 that was normal.  Carotid Doppler 08/2013 revealed <50% stenosis bilaterally. She reported recurrent palpitations and wore a 30 day event monitor 10/2015 that showed occasional short episodes of atrial fibrillation.  She has been managed on Eliquis, diltiazem and metoprolol.  She is not interested in statins.  Robin [name] reported episodes of near syncope.  She noted that her heart was irregular but in clinic she was found to have a wandering pacemaker.  She was referred for a Lexiscan Myoview 02/2018 which revealed LVEF 81% and no ischemia.  Since that time she took a trip to the [place] and had a recurrent episode.  She discovered that she was actually having vertigo.  Since her last appointment [name] has been doing well.  For the last couple months she has been struggling with chest apin.  She woke up with an episode of substernal chest apin.  She had a lot of belching and it improved. There was no radiation and she was not short of breawth.  She denies any recurrent chest pain. She got a new puppy and has been walking a lot.  She also had to more three houses and has been tired but has no chest pain.  She is sometimes short of breath.  She has some occasional ankle swelling that resolved after seeing [facility] and having a procedure.  At home her BP has been running low in the 120s.  This morning it was 299 systolic.  She was told that she has osteopenia and should be on phosamax but doesn't want to take it. She notes  that she had severe headaches after her second COVID-19 vaccine and is worried this will happen after a booster.     Past Medical History:  Diagnosis Date  . Anxiety   . AV bloc first degree   . Carotid stenosis 03/02/2017  . Hyperlipidemia   . Hypertension   . Lumbar pain    fell off of her horse and has low back pain at times  . Venous insufficiency of both lower extremities   . Vertigo     Past Surgical History:  Procedure Laterality Date  . ABDOMINAL HYSTERECTOMY    . BREAST BIOPSY Left   . BREAST EXCISIONAL BIOPSY Right   . BREAST LUMPECTOMY WITH RADIOACTIVE SEED LOCALIZATION Left 05/05/2015   Procedure: LEFT BREAST LUMPECTOMY WITH RADIOACTIVE SEED LOCALIZATION;  Surgeon: Autumn Messing III, MD;  Location: [facility];  Service: General;  Laterality: Left;  . CHOLECYSTECTOMY    . EYE SURGERY     bil cataract surgery  . LUNG SURGERY       Current Outpatient Medications  Medication Sig Dispense Refill  . BILBERRY-BIOFLAV-RUTIN-QUERCET PO Take 1,000 mg by mouth.    . Cholecalciferol (VITAMIN D PO) Take 2,400 mg by mouth daily.      Marland Kitchen diltiazem (TIAZAC) 180 MG 24 hr capsule TAKE (1) CAPSULE DAILY. 90 capsule 2  . diphenoxylate-atropine (LOMOTIL) 2.5-0.025 MG per tablet Take 1 tablet by  mouth daily as needed (for diarrhea).     Arne Cleveland 5 MG TABS tablet TAKE 1 TABLET BY MOUTH TWICE DAILY. 180 tablet 3  . estradiol (ESTRACE) 0.5 MG tablet Take 0.5 mg by mouth daily.    . Lactobacillus Rhamnosus, GG, (CULTURELLE PO) Take 1 capsule by mouth every morning.    . Lutein 20 MG CAPS Take 1 capsule by mouth daily.     . metoprolol succinate (TOPROL-XL) 100 MG 24 hr tablet Take half tablet in the morning and half tablet in the evening 30 tablet 0  . potassium chloride SA (KLOR-CON) 20 MEQ tablet Take 1 tablet (20 mEq total) by mouth 2 (two) times daily. (Patient taking differently: Take 20 mEq by mouth once. ) 180 tablet 0  . triamcinolone cream (KENALOG) 0.1 % Apply 1  application topically daily as needed (for rash).     . Triamterene-HCTZ (DYAZIDE PO) Take 25 mg by mouth daily.      . vitamin A 10000 UNIT capsule Take 10,000 Units by mouth daily.    . Multiple Vitamins-Minerals (HAIR SKIN NAILS PO) Take 1 capsule by mouth daily.     No current facility-administered medications for this visit.    Allergies:   Iodinated diagnostic agents, Amlodipine, Codeine, Diphenhydramine, Diphenhydramine hcl, Pravastatin, Zetia [ezetimibe], Sulfa antibiotics, and Sulfa drugs cross reactors    Social History:  The patient  reports that she has never smoked. She has never used smokeless tobacco. She reports current alcohol use. She reports that she does not use drugs.   Family History:  The patient's family history includes Cancer in her sister; Heart disease in her father and mother; Hypertension in her brother, mother, sister, sister, and sister; Stroke in her mother.    ROS:  Please see the history of present illness.   Otherwise, review of systems are positive for back pain.   All other systems are reviewed and negative.    PHYSICAL EXAM: VS:  BP (!) 152/81   Pulse 81   Ht 5\' 4"  (1.626 m)   Wt 146 lb 3.2 oz (66.3 kg)   SpO2 99%   BMI 25.10 kg/m  , BMI Body mass index is 25.1 kg/m. GENERAL:  Well appearing HEENT: Pupils equal round and reactive, fundi not visualized, oral mucosa unremarkable NECK:  No jugular venous distention, waveform within normal limits, carotid upstroke brisk and symmetric, no bruits LUNGS:  Clear to auscultation bilaterally HEART:  Irregularly irregular.  PMI not displaced or sustained,S1 and S2 within normal limits, no S3, no S4, no clicks, no rubs, no murmurs ABD:  Flat, positive bowel sounds normal in frequency in pitch, no bruits, no rebound, no guarding, no midline pulsatile mass, no hepatomegaly, no splenomegaly EXT:  2 plus pulses throughout, no edema, no cyanosis no clubbing SKIN:  No rashes no nodules NEURO:  Cranial nerves  II through XII grossly intact, motor grossly intact throughout PSYCH:  Cognitively intact, oriented to person place and time   EKG:  EKG is ordered today. The ekg ordered 11/27/16 demonstrates sinus rhythm rate 68 bpm.  First degree AV block.  02/21/18: Wandering pacemaker.  Rate 67 bpm.  First degree AV block 08/15/18: Atrial fibrillation.  Rate 65 bpm.   03/09/20: Atrial fibrillation.  Rate 81 bpm.    Lexiscan Myoview 02/2018:   Nuclear stress EF: 81%.  The left ventricular ejection fraction is hyperdynamic (>65%).  The study is normal.  This is a low risk study.   Low risk stress nuclear  study with normal perfusion and normal left ventricular regional and global systolic function.   30 day Event Monitor 11/08/15:  Quality: Fair.  Baseline artifact. Predominant rhythm: sinus rhythm at atrial fibrillation both noted Sinus rhythm: Average heart rate: 73 bpm Max heart rate: 123 bpm Min heart rate: 46 bpm  Atrial fibrillation: Rates 58-122 bpm.   <1% atrial fibrillation burden.  Carotid Dopplers 11/19/17: 1-39% ICA stenosis bilaterally.  Recent Labs: No results found for requested labs within last 8760 hours.   07/30/16: Sodium 139, potassium 3.6, BUN 13, creatinine 0.69 AST 14, ALT 15 Total cholesterol 198, triglycerides 131, HDL 63, LDL 109  11/01/17:  Total cholesterol 181, triglycerides 80, HDL 64, LDL 101 Creatinine 0.63 TSH 2.24   Lipid Panel    Component Value Date/Time   CHOL 213 (H) 06/14/2011 0832   TRIG 85.0 06/14/2011 0832   HDL 32.60 (L) 06/14/2011 0832   CHOLHDL 7 06/14/2011 0832   VLDL 17.0 06/14/2011 0832   LDLDIRECT 161.5 06/14/2011 0832      Wt Readings from Last 3 Encounters:  03/09/20 146 lb 3.2 oz (66.3 kg)  07/01/19 142 lb 12.8 oz (64.8 kg)  03/26/19 144 lb (65.3 kg)      ASSESSMENT AND PLAN:  # Paroxysmal atrial fibrillation/flutter # PACs: She remains in atrial fibrillation.  Rate is controlled and she is asymptomatic.  Stable  on metoprolol, diltiazem and Eliquis.    # LE Edema:  Symtpoms have improved with HCTZ and sclerotherapy.  # Exertional dyspnea:  Lexiscan Myoview negative for ischemia 02/2018.  Patient on echo 08/2018.  Overall she is doing well now.  # Hypertension:  # White coat hypertension: Her blood pressure is elevated here as always.  It remains controlled at home. Continue diltiazem, metoprolol, and HCTZ/triamterene.  She will continue to track it at home.  Her home machine is accurate.  # Hyperlipidemia: LDL 101 08/2017.  She tried pravastatin but didn't tolerate it.  She isn't interested in trying other statins.  She was given information on Praluent, Repatha, and bempedoic acid to consider.  # Carotid stenosis: <50% stenosis bilaterally in 08/2013. Mild on Dopplers 11/2017.  Consider lipid-lowering therapy as above.   Current medicines are reviewed at length with the patient today.  The patient does not have concerns regarding medicines.  The following changes have been made:  no change  Labs/ tests ordered today include:   Orders Placed This Encounter  Procedures  . EKG 12-Lead      Disposition:   FU with Alden Bensinger C. Oval Linsey, MD, [facility] in 1 year   Signed, Derrell Milanes C. Oval Linsey, MD, [facility]  03/09/2020 12:45 PM    [place]

## 2020-03-28 ENCOUNTER — Other Ambulatory Visit: Payer: Self-pay | Admitting: Cardiovascular Disease

## 2020-03-28 DIAGNOSIS — I4819 Other persistent atrial fibrillation: Secondary | ICD-10-CM

## 2020-03-28 NOTE — Telephone Encounter (Signed)
Prescription refill request for Eliquis received. Indication: a fib Last office visit: 03/09/20 Scr: 0.68 Age:  85 Weight: 66.3kg

## 2020-03-29 DIAGNOSIS — J029 Acute pharyngitis, unspecified: Secondary | ICD-10-CM | POA: Diagnosis not present

## 2020-04-22 ENCOUNTER — Other Ambulatory Visit: Payer: Self-pay | Admitting: Cardiovascular Disease

## 2020-05-21 ENCOUNTER — Ambulatory Visit: Payer: Medicare PPO | Attending: Internal Medicine

## 2020-05-21 DIAGNOSIS — Z23 Encounter for immunization: Secondary | ICD-10-CM

## 2020-05-21 NOTE — Progress Notes (Signed)
   Covid-19 Vaccination Clinic  Name:  Robin [name]    MRN: [id number] DOB: 10/26/1934  05/21/2020  [name] was observed post Covid-19 immunization for 15 minutes without incident. She was provided with Vaccine Information Sheet and instruction to access the V-Safe system.   [name] was instructed to call 911 with any severe reactions post vaccine: Marland Kitchen Difficulty breathing  . Swelling of face and throat  . A fast heartbeat  . A bad rash all over body  . Dizziness and weakness   Immunizations Administered    Name Date Dose VIS Date Route   Pfizer COVID-19 Vaccine 05/21/2020 11:18 AM 0.3 mL 04/06/2020 Intramuscular   Manufacturer: [employer]   Lot: [id number]   NDC: [address]-1

## 2020-06-27 DIAGNOSIS — R319 Hematuria, unspecified: Secondary | ICD-10-CM | POA: Diagnosis not present

## 2020-06-27 DIAGNOSIS — R0602 Shortness of breath: Secondary | ICD-10-CM | POA: Diagnosis not present

## 2020-06-27 DIAGNOSIS — R5383 Other fatigue: Secondary | ICD-10-CM | POA: Diagnosis not present

## 2020-06-27 DIAGNOSIS — R109 Unspecified abdominal pain: Secondary | ICD-10-CM | POA: Diagnosis not present

## 2020-06-28 DIAGNOSIS — R109 Unspecified abdominal pain: Secondary | ICD-10-CM | POA: Diagnosis not present

## 2020-06-28 DIAGNOSIS — R319 Hematuria, unspecified: Secondary | ICD-10-CM | POA: Diagnosis not present

## 2020-06-28 DIAGNOSIS — R0602 Shortness of breath: Secondary | ICD-10-CM | POA: Diagnosis not present

## 2020-06-28 DIAGNOSIS — R5383 Other fatigue: Secondary | ICD-10-CM | POA: Diagnosis not present

## 2020-07-13 ENCOUNTER — Other Ambulatory Visit: Payer: Self-pay | Admitting: Cardiovascular Disease

## 2020-07-13 DIAGNOSIS — I4819 Other persistent atrial fibrillation: Secondary | ICD-10-CM

## 2020-08-10 DIAGNOSIS — H52203 Unspecified astigmatism, bilateral: Secondary | ICD-10-CM | POA: Diagnosis not present

## 2020-08-10 DIAGNOSIS — D23122 Other benign neoplasm of skin of left lower eyelid, including canthus: Secondary | ICD-10-CM | POA: Diagnosis not present

## 2020-08-10 DIAGNOSIS — H401131 Primary open-angle glaucoma, bilateral, mild stage: Secondary | ICD-10-CM | POA: Diagnosis not present

## 2020-08-10 DIAGNOSIS — Z961 Presence of intraocular lens: Secondary | ICD-10-CM | POA: Diagnosis not present

## 2020-08-15 DIAGNOSIS — I8312 Varicose veins of left lower extremity with inflammation: Secondary | ICD-10-CM | POA: Diagnosis not present

## 2020-08-15 DIAGNOSIS — I8311 Varicose veins of right lower extremity with inflammation: Secondary | ICD-10-CM | POA: Diagnosis not present

## 2020-08-30 DIAGNOSIS — Z6824 Body mass index (BMI) 24.0-24.9, adult: Secondary | ICD-10-CM | POA: Diagnosis not present

## 2020-08-30 DIAGNOSIS — Z01419 Encounter for gynecological examination (general) (routine) without abnormal findings: Secondary | ICD-10-CM | POA: Diagnosis not present

## 2020-09-07 ENCOUNTER — Other Ambulatory Visit: Payer: Self-pay | Admitting: Cardiovascular Disease

## 2020-09-07 DIAGNOSIS — H401111 Primary open-angle glaucoma, right eye, mild stage: Secondary | ICD-10-CM | POA: Diagnosis not present

## 2020-09-14 DIAGNOSIS — H401122 Primary open-angle glaucoma, left eye, moderate stage: Secondary | ICD-10-CM | POA: Diagnosis not present

## 2020-09-30 ENCOUNTER — Telehealth: Payer: Self-pay | Admitting: Cardiovascular Disease

## 2020-09-30 NOTE — Telephone Encounter (Signed)
Patient called in to say that her bp hasnt been normal, she is been feeling really bad the last week or so. She took her bp last night and was 103/59 HR 84. She thinks he has something to do with the new medication she may be on.

## 2020-09-30 NOTE — Telephone Encounter (Signed)
Spoke to patient . She states  Last weekend she had a fainting spell but she did not contact anyone ,nor did she take blood pressure at that time.  she states she has been feeling sick all this week . Patient states she is feeling better today .  Last night blood pressure was 103/50 . She did not take night dose of Metoprolol . Today's blood pressure is  120/74 heart rate 88.    she states she takes Metoprolol 50 mg twice a day . Diltiazem 180 mg  After lunch, Dyazide 25 mg  In the morning. RN asked patient about her hydration , per patient her urine is very pale in color as the day progress.  RN informed patient continue to monitor blood pressure for next 2 weeks. It would be helpful to see a trent of pressure rather than 2 or 3. Since patient stated she had not been feeling well this past week it may the reason blood pressure was lower. Patient can contact to primary to manage this issue as well they would be the first line to call. Patient verbalized understanding.

## 2020-10-06 ENCOUNTER — Other Ambulatory Visit: Payer: Self-pay | Admitting: Cardiovascular Disease

## 2020-10-06 DIAGNOSIS — I4819 Other persistent atrial fibrillation: Secondary | ICD-10-CM

## 2020-10-06 NOTE — Telephone Encounter (Signed)
70f, 66.3kg, Creatinine, Serum 0.780 mg/ 06/28/2020, lovw/[place] 03/09/20

## 2020-10-20 ENCOUNTER — Telehealth: Payer: Self-pay | Admitting: Cardiovascular Disease

## 2020-10-20 NOTE — Telephone Encounter (Signed)
Spoke with pt, she is now leaving in [place] and the drive is going to be too long for her to go to the [facility]. She does not know of any provider here in the office to switch to and she is wanting guidance with that. Aware melinda in clinic so there maybe a delay in her calling back. Patient voiced understanding

## 2020-10-20 NOTE — Telephone Encounter (Signed)
     Pt would like to speak with Rip Harbour, she wanted to discuss about getting a different provider since Dr. Oval Linsey moving to a new office, she said she needs Melinda's help who can she switch to

## 2020-10-20 NOTE — Telephone Encounter (Signed)
Spoke with patient regarding changing providers secondary to longer drive, does not like highway driving Patient stated she has been having shortness of breath with exertion and concerned coming from [facility] since she is always in [place] when checks her blood pressure  Does have some swelling at times but nothing consistent  This has been going on since January, has not gotten worse  Scheduled her visit with Dr Oval Linsey for 6/1 Advised patient to call back if worsening symptoms, verbalized understanding

## 2020-10-25 DIAGNOSIS — E559 Vitamin D deficiency, unspecified: Secondary | ICD-10-CM | POA: Diagnosis not present

## 2020-10-25 DIAGNOSIS — I10 Essential (primary) hypertension: Secondary | ICD-10-CM | POA: Diagnosis not present

## 2020-10-25 DIAGNOSIS — E7800 Pure hypercholesterolemia, unspecified: Secondary | ICD-10-CM | POA: Diagnosis not present

## 2020-10-26 ENCOUNTER — Other Ambulatory Visit: Payer: Self-pay

## 2020-10-26 ENCOUNTER — Encounter (HOSPITAL_COMMUNITY): Payer: Self-pay | Admitting: Emergency Medicine

## 2020-10-26 ENCOUNTER — Inpatient Hospital Stay (HOSPITAL_COMMUNITY)
Admission: EM | Admit: 2020-10-26 | Discharge: 2020-11-29 | DRG: 329 | Disposition: A | Discharge status: Skilled Nursing Facility | Payer: Medicare PPO | Attending: Internal Medicine | Admitting: Internal Medicine

## 2020-10-26 ENCOUNTER — Emergency Department (HOSPITAL_COMMUNITY): Payer: Medicare PPO

## 2020-10-26 DIAGNOSIS — Z823 Family history of stroke: Secondary | ICD-10-CM

## 2020-10-26 DIAGNOSIS — R238 Other skin changes: Secondary | ICD-10-CM | POA: Diagnosis not present

## 2020-10-26 DIAGNOSIS — R140 Abdominal distension (gaseous): Secondary | ICD-10-CM | POA: Diagnosis not present

## 2020-10-26 DIAGNOSIS — Z87891 Personal history of nicotine dependence: Secondary | ICD-10-CM

## 2020-10-26 DIAGNOSIS — I10 Essential (primary) hypertension: Secondary | ICD-10-CM | POA: Diagnosis not present

## 2020-10-26 DIAGNOSIS — C182 Malignant neoplasm of ascending colon: Principal | ICD-10-CM | POA: Diagnosis present

## 2020-10-26 DIAGNOSIS — D509 Iron deficiency anemia, unspecified: Secondary | ICD-10-CM | POA: Diagnosis not present

## 2020-10-26 DIAGNOSIS — N281 Cyst of kidney, acquired: Secondary | ICD-10-CM | POA: Diagnosis not present

## 2020-10-26 DIAGNOSIS — Z95828 Presence of other vascular implants and grafts: Secondary | ICD-10-CM

## 2020-10-26 DIAGNOSIS — D62 Acute posthemorrhagic anemia: Secondary | ICD-10-CM | POA: Diagnosis present

## 2020-10-26 DIAGNOSIS — K648 Other hemorrhoids: Secondary | ICD-10-CM | POA: Diagnosis present

## 2020-10-26 DIAGNOSIS — I110 Hypertensive heart disease with heart failure: Secondary | ICD-10-CM | POA: Diagnosis present

## 2020-10-26 DIAGNOSIS — J690 Pneumonitis due to inhalation of food and vomit: Secondary | ICD-10-CM | POA: Diagnosis not present

## 2020-10-26 DIAGNOSIS — Z20822 Contact with and (suspected) exposure to covid-19: Secondary | ICD-10-CM | POA: Diagnosis present

## 2020-10-26 DIAGNOSIS — J811 Chronic pulmonary edema: Secondary | ICD-10-CM | POA: Diagnosis not present

## 2020-10-26 DIAGNOSIS — Q438 Other specified congenital malformations of intestine: Secondary | ICD-10-CM | POA: Diagnosis not present

## 2020-10-26 DIAGNOSIS — Z882 Allergy status to sulfonamides status: Secondary | ICD-10-CM

## 2020-10-26 DIAGNOSIS — Z85038 Personal history of other malignant neoplasm of large intestine: Secondary | ICD-10-CM | POA: Diagnosis not present

## 2020-10-26 DIAGNOSIS — I6529 Occlusion and stenosis of unspecified carotid artery: Secondary | ICD-10-CM | POA: Diagnosis present

## 2020-10-26 DIAGNOSIS — K922 Gastrointestinal hemorrhage, unspecified: Secondary | ICD-10-CM | POA: Diagnosis present

## 2020-10-26 DIAGNOSIS — I5033 Acute on chronic diastolic (congestive) heart failure: Secondary | ICD-10-CM | POA: Diagnosis present

## 2020-10-26 DIAGNOSIS — K567 Ileus, unspecified: Secondary | ICD-10-CM

## 2020-10-26 DIAGNOSIS — E7800 Pure hypercholesterolemia, unspecified: Secondary | ICD-10-CM | POA: Diagnosis not present

## 2020-10-26 DIAGNOSIS — Z888 Allergy status to other drugs, medicaments and biological substances status: Secondary | ICD-10-CM

## 2020-10-26 DIAGNOSIS — K8020 Calculus of gallbladder without cholecystitis without obstruction: Secondary | ICD-10-CM

## 2020-10-26 DIAGNOSIS — I509 Heart failure, unspecified: Secondary | ICD-10-CM

## 2020-10-26 DIAGNOSIS — K565 Intestinal adhesions [bands], unspecified as to partial versus complete obstruction: Secondary | ICD-10-CM | POA: Diagnosis present

## 2020-10-26 DIAGNOSIS — Z79899 Other long term (current) drug therapy: Secondary | ICD-10-CM

## 2020-10-26 DIAGNOSIS — D649 Anemia, unspecified: Secondary | ICD-10-CM | POA: Diagnosis not present

## 2020-10-26 DIAGNOSIS — J189 Pneumonia, unspecified organism: Secondary | ICD-10-CM | POA: Diagnosis not present

## 2020-10-26 DIAGNOSIS — Z8249 Family history of ischemic heart disease and other diseases of the circulatory system: Secondary | ICD-10-CM

## 2020-10-26 DIAGNOSIS — Z0000 Encounter for general adult medical examination without abnormal findings: Secondary | ICD-10-CM | POA: Diagnosis not present

## 2020-10-26 DIAGNOSIS — K5939 Other megacolon: Secondary | ICD-10-CM | POA: Diagnosis not present

## 2020-10-26 DIAGNOSIS — E43 Unspecified severe protein-calorie malnutrition: Secondary | ICD-10-CM | POA: Diagnosis present

## 2020-10-26 DIAGNOSIS — R1110 Vomiting, unspecified: Secondary | ICD-10-CM

## 2020-10-26 DIAGNOSIS — Z978 Presence of other specified devices: Secondary | ICD-10-CM

## 2020-10-26 DIAGNOSIS — I872 Venous insufficiency (chronic) (peripheral): Secondary | ICD-10-CM | POA: Diagnosis present

## 2020-10-26 DIAGNOSIS — D123 Benign neoplasm of transverse colon: Secondary | ICD-10-CM | POA: Diagnosis not present

## 2020-10-26 DIAGNOSIS — D126 Benign neoplasm of colon, unspecified: Secondary | ICD-10-CM

## 2020-10-26 DIAGNOSIS — K921 Melena: Secondary | ICD-10-CM | POA: Diagnosis not present

## 2020-10-26 DIAGNOSIS — K6389 Other specified diseases of intestine: Secondary | ICD-10-CM | POA: Diagnosis not present

## 2020-10-26 DIAGNOSIS — C801 Malignant (primary) neoplasm, unspecified: Secondary | ICD-10-CM | POA: Diagnosis not present

## 2020-10-26 DIAGNOSIS — L8990 Pressure ulcer of unspecified site, unspecified stage: Secondary | ICD-10-CM | POA: Insufficient documentation

## 2020-10-26 DIAGNOSIS — K580 Irritable bowel syndrome with diarrhea: Secondary | ICD-10-CM | POA: Diagnosis present

## 2020-10-26 DIAGNOSIS — C185 Malignant neoplasm of splenic flexure: Secondary | ICD-10-CM | POA: Diagnosis present

## 2020-10-26 DIAGNOSIS — Z1389 Encounter for screening for other disorder: Secondary | ICD-10-CM | POA: Diagnosis not present

## 2020-10-26 DIAGNOSIS — I480 Paroxysmal atrial fibrillation: Secondary | ICD-10-CM | POA: Diagnosis present

## 2020-10-26 DIAGNOSIS — Z0189 Encounter for other specified special examinations: Secondary | ICD-10-CM

## 2020-10-26 DIAGNOSIS — J479 Bronchiectasis, uncomplicated: Secondary | ICD-10-CM | POA: Diagnosis not present

## 2020-10-26 DIAGNOSIS — Z8719 Personal history of other diseases of the digestive system: Secondary | ICD-10-CM

## 2020-10-26 DIAGNOSIS — I700 Atherosclerosis of aorta: Secondary | ICD-10-CM | POA: Diagnosis not present

## 2020-10-26 DIAGNOSIS — J984 Other disorders of lung: Secondary | ICD-10-CM | POA: Diagnosis not present

## 2020-10-26 DIAGNOSIS — Z91041 Radiographic dye allergy status: Secondary | ICD-10-CM

## 2020-10-26 DIAGNOSIS — L89312 Pressure ulcer of right buttock, stage 2: Secondary | ICD-10-CM | POA: Diagnosis not present

## 2020-10-26 DIAGNOSIS — E869 Volume depletion, unspecified: Secondary | ICD-10-CM | POA: Diagnosis not present

## 2020-10-26 DIAGNOSIS — E785 Hyperlipidemia, unspecified: Secondary | ICD-10-CM | POA: Diagnosis present

## 2020-10-26 DIAGNOSIS — K635 Polyp of colon: Secondary | ICD-10-CM | POA: Diagnosis not present

## 2020-10-26 DIAGNOSIS — Z885 Allergy status to narcotic agent status: Secondary | ICD-10-CM

## 2020-10-26 DIAGNOSIS — R0602 Shortness of breath: Secondary | ICD-10-CM | POA: Diagnosis not present

## 2020-10-26 DIAGNOSIS — R110 Nausea: Secondary | ICD-10-CM

## 2020-10-26 DIAGNOSIS — Z452 Encounter for adjustment and management of vascular access device: Secondary | ICD-10-CM | POA: Diagnosis not present

## 2020-10-26 DIAGNOSIS — Z90710 Acquired absence of both cervix and uterus: Secondary | ICD-10-CM

## 2020-10-26 DIAGNOSIS — C189 Malignant neoplasm of colon, unspecified: Secondary | ICD-10-CM | POA: Diagnosis present

## 2020-10-26 DIAGNOSIS — N739 Female pelvic inflammatory disease, unspecified: Secondary | ICD-10-CM | POA: Diagnosis not present

## 2020-10-26 DIAGNOSIS — R5383 Other fatigue: Secondary | ICD-10-CM | POA: Diagnosis not present

## 2020-10-26 DIAGNOSIS — K56609 Unspecified intestinal obstruction, unspecified as to partial versus complete obstruction: Secondary | ICD-10-CM

## 2020-10-26 DIAGNOSIS — Z9049 Acquired absence of other specified parts of digestive tract: Secondary | ICD-10-CM

## 2020-10-26 DIAGNOSIS — I5031 Acute diastolic (congestive) heart failure: Secondary | ICD-10-CM | POA: Diagnosis not present

## 2020-10-26 DIAGNOSIS — Z7901 Long term (current) use of anticoagulants: Secondary | ICD-10-CM | POA: Diagnosis not present

## 2020-10-26 DIAGNOSIS — F419 Anxiety disorder, unspecified: Secondary | ICD-10-CM | POA: Diagnosis present

## 2020-10-26 DIAGNOSIS — R109 Unspecified abdominal pain: Secondary | ICD-10-CM | POA: Diagnosis not present

## 2020-10-26 DIAGNOSIS — K3189 Other diseases of stomach and duodenum: Secondary | ICD-10-CM | POA: Diagnosis present

## 2020-10-26 DIAGNOSIS — R195 Other fecal abnormalities: Secondary | ICD-10-CM

## 2020-10-26 DIAGNOSIS — Z01818 Encounter for other preprocedural examination: Secondary | ICD-10-CM | POA: Diagnosis not present

## 2020-10-26 DIAGNOSIS — I2510 Atherosclerotic heart disease of native coronary artery without angina pectoris: Secondary | ICD-10-CM | POA: Diagnosis not present

## 2020-10-26 DIAGNOSIS — R0682 Tachypnea, not elsewhere classified: Secondary | ICD-10-CM | POA: Diagnosis not present

## 2020-10-26 DIAGNOSIS — Z4682 Encounter for fitting and adjustment of non-vascular catheter: Secondary | ICD-10-CM | POA: Diagnosis not present

## 2020-10-26 DIAGNOSIS — E876 Hypokalemia: Secondary | ICD-10-CM | POA: Diagnosis not present

## 2020-10-26 DIAGNOSIS — D490 Neoplasm of unspecified behavior of digestive system: Secondary | ICD-10-CM | POA: Diagnosis not present

## 2020-10-26 DIAGNOSIS — K9189 Other postprocedural complications and disorders of digestive system: Secondary | ICD-10-CM | POA: Diagnosis not present

## 2020-10-26 DIAGNOSIS — C180 Malignant neoplasm of cecum: Secondary | ICD-10-CM | POA: Diagnosis present

## 2020-10-26 DIAGNOSIS — R911 Solitary pulmonary nodule: Secondary | ICD-10-CM | POA: Diagnosis not present

## 2020-10-26 DIAGNOSIS — Z809 Family history of malignant neoplasm, unspecified: Secondary | ICD-10-CM

## 2020-10-26 DIAGNOSIS — M47814 Spondylosis without myelopathy or radiculopathy, thoracic region: Secondary | ICD-10-CM | POA: Diagnosis not present

## 2020-10-26 DIAGNOSIS — D500 Iron deficiency anemia secondary to blood loss (chronic): Secondary | ICD-10-CM | POA: Diagnosis not present

## 2020-10-26 DIAGNOSIS — D539 Nutritional anemia, unspecified: Secondary | ICD-10-CM | POA: Diagnosis present

## 2020-10-26 DIAGNOSIS — C19 Malignant neoplasm of rectosigmoid junction: Secondary | ICD-10-CM | POA: Diagnosis not present

## 2020-10-26 DIAGNOSIS — D124 Benign neoplasm of descending colon: Secondary | ICD-10-CM | POA: Diagnosis not present

## 2020-10-26 DIAGNOSIS — R0902 Hypoxemia: Secondary | ICD-10-CM

## 2020-10-26 DIAGNOSIS — I517 Cardiomegaly: Secondary | ICD-10-CM | POA: Diagnosis not present

## 2020-10-26 LAB — CBC WITH DIFFERENTIAL/PLATELET
Abs Immature Granulocytes: 0.01 10*3/uL (ref 0.00–0.07)
Basophils Absolute: 0.1 10*3/uL (ref 0.0–0.1)
Basophils Relative: 1 %
Eosinophils Absolute: 0.2 10*3/uL (ref 0.0–0.5)
Eosinophils Relative: 2 %
HCT: 29.7 % — ABNORMAL LOW (ref 36.0–46.0)
Hemoglobin: 8.5 g/dL — ABNORMAL LOW (ref 12.0–15.0)
Immature Granulocytes: 0 %
Lymphocytes Relative: 24 %
Lymphs Abs: 1.5 10*3/uL (ref 0.7–4.0)
MCH: 21.5 pg — ABNORMAL LOW (ref 26.0–34.0)
MCHC: 28.6 g/dL — ABNORMAL LOW (ref 30.0–36.0)
MCV: 75 fL — ABNORMAL LOW (ref 80.0–100.0)
Monocytes Absolute: 0.7 10*3/uL (ref 0.1–1.0)
Monocytes Relative: 10 %
Neutro Abs: 3.9 10*3/uL (ref 1.7–7.7)
Neutrophils Relative %: 63 %
Platelets: 353 10*3/uL (ref 150–400)
RBC: 3.96 MIL/uL (ref 3.87–5.11)
RDW: 15.4 % (ref 11.5–15.5)
WBC: 6.3 10*3/uL (ref 4.0–10.5)
nRBC: 0 % (ref 0.0–0.2)

## 2020-10-26 LAB — COMPREHENSIVE METABOLIC PANEL
ALT: 14 U/L (ref 0–44)
AST: 18 U/L (ref 15–41)
Albumin: 3.8 g/dL (ref 3.5–5.0)
Alkaline Phosphatase: 104 U/L (ref 38–126)
Anion gap: 4 — ABNORMAL LOW (ref 5–15)
BUN: 12 mg/dL (ref 8–23)
CO2: 28 mmol/L (ref 22–32)
Calcium: 10.1 mg/dL (ref 8.9–10.3)
Chloride: 103 mmol/L (ref 98–111)
Creatinine, Ser: 0.55 mg/dL (ref 0.44–1.00)
GFR, Estimated: >60 mL/min (ref >60)
Glucose, Bld: 106 mg/dL — ABNORMAL HIGH (ref 70–99)
Potassium: 3.3 mmol/L — ABNORMAL LOW (ref 3.5–5.1)
Sodium: 135 mmol/L (ref 135–145)
Total Bilirubin: 1.1 mg/dL (ref 0.3–1.2)
Total Protein: 6.7 g/dL (ref 6.5–8.1)

## 2020-10-26 LAB — IRON AND TIBC
Iron: 22 ug/dL — ABNORMAL LOW (ref 28–170)
Saturation Ratios: 4 % — ABNORMAL LOW (ref 10.4–31.8)
TIBC: 525 ug/dL — ABNORMAL HIGH (ref 250–450)
UIBC: 503 ug/dL

## 2020-10-26 LAB — TYPE AND SCREEN
ABO/RH(D): O POS
Antibody Screen: NEGATIVE

## 2020-10-26 LAB — RETICULOCYTES
Immature Retic Fract: 21.3 % — ABNORMAL HIGH (ref 2.3–15.9)
RBC.: 3.98 MIL/uL (ref 3.87–5.11)
Retic Count, Absolute: 61.7 10*3/uL (ref 19.0–186.0)
Retic Ct Pct: 1.6 % (ref 0.4–3.1)

## 2020-10-26 LAB — FOLATE: Folate: 15.7 ng/mL (ref >5.9)

## 2020-10-26 LAB — TROPONIN I (HIGH SENSITIVITY)
Troponin I (High Sensitivity): 7 ng/L (ref <18)
Troponin I (High Sensitivity): 6 ng/L (ref <18)

## 2020-10-26 LAB — FERRITIN: Ferritin: 5 ng/mL — ABNORMAL LOW (ref 11–307)

## 2020-10-26 LAB — LIPASE, BLOOD: Lipase: 31 U/L (ref 11–51)

## 2020-10-26 LAB — RESP PANEL BY RT-PCR (FLU A&B, COVID) ARPGX2
Influenza A by PCR: NEGATIVE
Influenza B by PCR: NEGATIVE
SARS Coronavirus 2 by RT PCR: NEGATIVE

## 2020-10-26 LAB — VITAMIN B12: Vitamin B-12: 277 pg/mL (ref 180–914)

## 2020-10-26 LAB — ABO/RH: ABO/RH(D): O POS

## 2020-10-26 LAB — PROTIME-INR
INR: 1.4 — ABNORMAL HIGH (ref 0.8–1.2)
Prothrombin Time: 16.9 seconds — ABNORMAL HIGH (ref 11.4–15.2)

## 2020-10-26 LAB — POC OCCULT BLOOD, ED: Fecal Occult Bld: POSITIVE — AB

## 2020-10-26 MED ORDER — ONDANSETRON HCL 4 MG/2ML IJ SOLN
4.0000 mg | Freq: Four times a day (QID) | INTRAMUSCULAR | Status: DC | PRN
  Administered 2020-10-31 – 2020-11-24 (×16): 4 mg via INTRAVENOUS
  Filled 2020-10-26 (×16): qty 2

## 2020-10-26 MED ORDER — ACETAMINOPHEN 325 MG PO TABS: 650.0000 mg | ORAL_TABLET | Freq: Four times a day (QID) | ORAL | Status: DC | PRN

## 2020-10-26 MED ORDER — ACETAMINOPHEN 650 MG RE SUPP: 650.0000 mg | Freq: Four times a day (QID) | RECTAL | Status: DC | PRN

## 2020-10-26 MED ORDER — PANTOPRAZOLE SODIUM 40 MG IV SOLR
40.0000 mg | Freq: Once | INTRAVENOUS | Status: AC
  Administered 2020-10-26: 40 mg via INTRAVENOUS
  Filled 2020-10-26: qty 40

## 2020-10-26 MED ORDER — METOPROLOL SUCCINATE ER 50 MG PO TB24
50.0000 mg | ORAL_TABLET | Freq: Two times a day (BID) | ORAL | Status: DC
  Administered 2020-10-27 – 2020-11-02 (×14): 50 mg via ORAL
  Filled 2020-10-26 (×14): qty 1

## 2020-10-26 MED ORDER — ONDANSETRON HCL 4 MG PO TABS
4.0000 mg | ORAL_TABLET | Freq: Four times a day (QID) | ORAL | Status: DC | PRN
  Filled 2020-10-26: qty 1

## 2020-10-26 NOTE — ED Notes (Signed)
Patient gone for testing 

## 2020-10-26 NOTE — ED Notes (Signed)
Coming from PCP-Hgb 8-patient is on blood thinner-SOB, fatique

## 2020-10-26 NOTE — ED Notes (Signed)
Assisted pt to restroom. Ambulatory with steady gait. Denies dizziness and pain

## 2020-10-26 NOTE — ED Provider Notes (Signed)
[facility] DEPT Provider Note   CSN: [id number] Arrival date & time: 10/26/20  1721     History No chief complaint on file.   Robin [name] is a 86 y.o. female with a past medical history of A. fib anticoagulated with Eliquis, carotid stenosis, hypertension, hyperlipidemia, who presents today for evaluation of anemia and shortness of breath.    She was seen at her PCP yesterday and had a hemoglobin of 8 per her report. She states that since January she has had shortness of breath primarily with exertion.  When she was seen in January her hemoglobin was 12. She states that she had been slightly more short of breath recently.  She reports compliance with all of her medications, has not missed any doses of her Eliquis.  She denies any leg swelling.  She has not noticed any blood in her stools.  She denies any abdominal pain.  She does note that she had a wound on her toe that used for a while however does not have any other reported sources of bleeding.  She denies any syncopal events.  She has no prior history of GI bleeding.    HPI     Past Medical History:  Diagnosis Date  . Anxiety   . AV bloc first degree   . Carotid stenosis 03/02/2017  . Hyperlipidemia   . Hypertension   . Lumbar pain    fell off of her horse and has low back pain at times  . Venous insufficiency of both lower extremities   . Vertigo     Patient Active Problem List   Diagnosis Date Noted  . Chronic anticoagulation 02/12/2019  . Venous insufficiency (chronic) (peripheral) 02/12/2019  . Normal coronary arteries 02/12/2019  . Carotid stenosis 03/02/2017  . Paroxysmal atrial fibrillation ([place]) 08/05/2015  . First degree heart block by electrocardiogram 06/03/2014  . Hypokalemia 03/02/2014  . PAC (premature atrial contraction) 05/05/2012  . Hypercholesterolemia 06/13/2011  . Benign hypertensive heart disease without heart failure 12/11/2010    Past Surgical  History:  Procedure Laterality Date  . ABDOMINAL HYSTERECTOMY    . BREAST BIOPSY Left   . BREAST EXCISIONAL BIOPSY Right   . BREAST LUMPECTOMY WITH RADIOACTIVE SEED LOCALIZATION Left 05/05/2015   Procedure: LEFT BREAST LUMPECTOMY WITH RADIOACTIVE SEED LOCALIZATION;  Surgeon: Autumn Messing III, MD;  Location: [facility];  Service: General;  Laterality: Left;  . CHOLECYSTECTOMY    . EYE SURGERY     bil cataract surgery  . LUNG SURGERY       OB History   No obstetric history on file.     Family History  Problem Relation Age of Onset  . Hypertension Mother   . Stroke Mother   . Heart disease Mother   . Heart disease Father   . Hypertension Sister   . Cancer Sister   . Hypertension Brother   . Hypertension Sister   . Hypertension Sister   . Breast cancer Neg Hx     Social History   Tobacco Use  . Smoking status: Never Smoker  . Smokeless tobacco: Never Used  Vaping Use  . Vaping Use: Never used  Substance Use Topics  . Alcohol use: Yes    Comment: social  . Drug use: No    Home Medications Prior to Admission medications   Medication Sig Start Date End Date Taking? Authorizing Provider  atorvastatin (LIPITOR) 10 MG tablet Take 0.5 tablets by mouth 3 (three) times a  week. 09/02/20  Yes [provider]  Cholecalciferol (VITAMIN D) 50 MCG (2000 UT) tablet Take 2,000 Units by mouth at bedtime.   Yes [provider]  diphenoxylate-atropine (LOMOTIL) 2.5-0.025 MG per tablet Take 1 tablet by mouth daily as needed (for diarrhea).  08/27/14  Yes [provider]  ELIQUIS 5 MG TABS tablet TAKE 1 TABLET BY MOUTH TWICE DAILY. 10/06/20  Yes Skeet Latch, MD  estradiol (ESTRACE) 1 MG tablet Take 0.5 mg by mouth daily. 08/24/20  Yes [provider]  Lactobacillus Rhamnosus, GG, (CULTURELLE PO) Take 1 capsule by mouth every morning.   Yes [provider]  Lutein 20 MG CAPS Take 1 capsule by mouth every evening.   Yes [provider]  metoprolol succinate (TOPROL-XL) 50 MG 24 hr tablet Take 50 mg by mouth 2 (two) times daily. 10/03/20  Yes [provider]  potassium chloride SA (KLOR-CON) 20 MEQ tablet TAKE 1 TABLET BY MOUTH TWICE DAILY. Patient taking differently: Take 20 mEq by mouth daily. 04/22/20  Yes Skeet Latch, MD  TIADYLT ER 180 MG 24 hr capsule TAKE (1) CAPSULE DAILY. Patient taking differently: Take 180 mg by mouth daily. 09/07/20  Yes Skeet Latch, MD  tiZANidine (ZANAFLEX) 2 MG tablet Take 2 mg by mouth every 8 (eight) hours as needed for muscle spasms. 10/03/20  Yes [provider]  triamcinolone cream (KENALOG) 0.1 % Apply 1 application topically daily as needed (for rash).  09/13/14  Yes [provider]  triamterene-hydrochlorothiazide (MAXZIDE-25) 37.5-25 MG tablet Take 1 tablet by mouth daily. 08/17/20  Yes [provider]  vitamin A 10000 UNIT capsule Take 10,000 Units by mouth daily.   Yes [provider]    Allergies    Iodinated diagnostic agents, Amlodipine, Codeine, Diphenhydramine, Diphenhydramine hcl, Pravastatin, Zetia [ezetimibe], Sulfa antibiotics, and Sulfa drugs cross reactors  Review of Systems   Review of Systems  Constitutional: Negative for chills and fever.  HENT: Negative for congestion.   Eyes: Negative for visual disturbance.  Respiratory: Positive for shortness of breath. Negative for cough and chest tightness.   Cardiovascular: Negative for chest pain, palpitations and leg swelling.  Gastrointestinal: Negative for abdominal pain, blood in stool, diarrhea, nausea and vomiting.  Genitourinary: Negative for dysuria and hematuria.  Musculoskeletal: Negative for back pain and neck pain.  Skin: Negative for color change.  Neurological: Negative for weakness, light-headedness and headaches.  Psychiatric/Behavioral: Negative for confusion.  All other systems reviewed and are negative.   Physical Exam Updated Vital  Signs BP 113/63   Pulse (!) 59   Temp 97.7 F (36.5 C) (Oral)   Resp 11   Ht 5\' 4"  (1.626 m)   Wt 66 kg   SpO2 100%   BMI 24.98 kg/m   Physical Exam Vitals and nursing note reviewed.  Constitutional:      General: She is not in acute distress.    Appearance: She is not diaphoretic.  HENT:     Head: Normocephalic and atraumatic.  Eyes:     General: No scleral icterus.       Right eye: No discharge.        Left eye: No discharge.     Conjunctiva/sclera: Conjunctivae normal.  Cardiovascular:     Rate and Rhythm: Normal rate. Rhythm irregular.     Pulses: Normal pulses.     Heart sounds: Normal heart sounds. No murmur heard.   Pulmonary:     Effort: Pulmonary effort is normal. No respiratory distress.  Breath sounds: Normal breath sounds. No stridor.  Abdominal:     General: There is no distension.  Genitourinary:    Comments: Rectal performed by Dr. Vanita Panda.  Per his report stool was brown without frank melena Musculoskeletal:        General: No deformity.     Cervical back: Normal range of motion and neck supple.     Right lower leg: No edema.     Left lower leg: No edema.  Skin:    General: Skin is warm and dry.  Neurological:     Mental Status: She is alert.     Motor: No abnormal muscle tone.     Comments: Patient is awake and alert, answers questions appropriately.  Speech is not slurred.  Coordination is grossly intact.  Psychiatric:        Behavior: Behavior normal.     ED Results / Procedures / Treatments   Labs (all labs ordered are listed, but only abnormal results are displayed) Labs Reviewed  COMPREHENSIVE METABOLIC PANEL - Abnormal; Notable for the following components:      Result Value   Potassium 3.3 (*)    Glucose, Bld 106 (*)    Anion gap 4 (*)    All other components within normal limits  CBC WITH DIFFERENTIAL/PLATELET - Abnormal; Notable for the following components:   Hemoglobin 8.5 (*)    HCT 29.7 (*)    MCV 75.0 (*)    MCH  21.5 (*)    MCHC 28.6 (*)    All other components within normal limits  PROTIME-INR - Abnormal; Notable for the following components:   Prothrombin Time 16.9 (*)    INR 1.4 (*)    All other components within normal limits  IRON AND TIBC - Abnormal; Notable for the following components:   Iron 22 (*)    TIBC 525 (*)    Saturation Ratios 4 (*)    All other components within normal limits  FERRITIN - Abnormal; Notable for the following components:   Ferritin 5 (*)    All other components within normal limits  POC OCCULT BLOOD, ED - Abnormal; Notable for the following components:   Fecal Occult Bld POSITIVE (*)    All other components within normal limits  RESP PANEL BY RT-PCR (FLU A&B, COVID) ARPGX2  LIPASE, BLOOD  VITAMIN B12  FOLATE  RETICULOCYTES  TYPE AND SCREEN  TROPONIN I (HIGH SENSITIVITY)  TROPONIN I (HIGH SENSITIVITY)    EKG EKG Interpretation  Date/Time:  Wednesday Oct 26 2020 19:10:18 EDT Ventricular Rate:  85 PR Interval:    QRS Duration: 79 QT Interval:  374 QTC Calculation: 445 R Axis:   67 Text Interpretation: Atrial fibrillation Borderline low voltage, extremity leads Abnormal ECG Confirmed by Carmin Muskrat [phone]) on 10/26/2020 7:25:45 PM   Radiology DG Chest 2 View  Result Date: 10/26/2020 CLINICAL DATA:  Shortness of breath.  Fatigue. EXAM: CHEST - 2 VIEW COMPARISON:  Radiograph 08/19/2015.  CT 12/16/2017 FINDINGS: Upper normal heart size with normal mediastinal contours. No pulmonary edema, pleural effusion, or pneumothorax. No focal airspace disease. Mild biapical pleuroparenchymal scarring. Pulmonary nodules on prior CT are not well seen by radiograph. Chronic lower thoracic compression fracture, unchanged. IMPRESSION: No acute chest findings. Electronically Signed   By: Keith Rake M.D.   On: 10/26/2020 19:43    Procedures Procedures   Medications Ordered in ED Medications  pantoprazole (PROTONIX) injection 40 mg (40 mg Intravenous Given  10/26/20 2123)    ED  Course  I have reviewed the triage vital signs and the nursing notes.  Pertinent labs & imaging results that were available during my care of the patient were reviewed by me and considered in my medical decision making (see chart for details).  Clinical Course as of 10/26/20 2227  Wed Oct 26, 2020  2149 Non emergent GI consult placed to Dr. Cristina Gong for [facility] as patient PCP is [facility] per agreement.   They are given a secure chat message and added to the chart as a consultant.   [EH]    Clinical Course User Index [EH] Ollen Gross   MDM Rules/Calculators/A&P                         Patient is a 86 year old woman who presents today for evaluation of anemia.  In January last time she had blood drawn her hemoglobin was 12, however at her PCPs it was now 8 per her report.  She is anticoagulated with Eliquis and reports compliance with all of her meds. Here today her hemoglobin is 8.5, and she has occult positive stools. Given that she states her shortness of breath has worsened x-ray evaluation was performed including chest x-ray without acute abnormalities, EKG shows A. fib for which patient is taking Eliquis, troponin is not elevated.  She does not have any leg swelling, no clear edema or fluid overload.  She is 100% on room air. Given that she is occult positive with worsening shortness of breath in the setting of anticoagulation and Hemoccult stools she will require admission for further evaluation.  She is given Protonix.  Nonemergent GI consult placed to [facility] per protocol. Hospitalist is consulted for admission.  Anemia panel is sent.  With her hemoglobin of 8.5 she is not transfused in the emergency room.  Anemia panel does show that her iron is slightly low at 22 and her ferritin is low at 11.    Dr. Vanita Panda spoke with Dr. Alcario Drought per his report who will see the patient for admission.     Note: Portions of this report may have been transcribed  using voice recognition software. Every effort was made to ensure accuracy; however, inadvertent computerized transcription errors may be present  Final Clinical Impression(s) / ED Diagnoses Final diagnoses:  Anemia, unspecified type  Shortness of breath  Heme positive stool    Rx / DC Orders ED Discharge Orders    None       Ollen Gross 10/26/20 2326    Carmin Muskrat, MD 10/27/20 2232

## 2020-10-26 NOTE — ED Triage Notes (Signed)
Sent by PCP, pt's HMG was 8.0 on labs done yesterday and pt is on eliquis. States SOB since January, bloody stools or bloody emesis.

## 2020-10-27 ENCOUNTER — Encounter (HOSPITAL_COMMUNITY): Payer: Self-pay | Admitting: Internal Medicine

## 2020-10-27 ENCOUNTER — Other Ambulatory Visit: Payer: Self-pay | Admitting: Nurse Practitioner

## 2020-10-27 DIAGNOSIS — E869 Volume depletion, unspecified: Secondary | ICD-10-CM | POA: Diagnosis not present

## 2020-10-27 DIAGNOSIS — I5033 Acute on chronic diastolic (congestive) heart failure: Secondary | ICD-10-CM | POA: Diagnosis present

## 2020-10-27 DIAGNOSIS — C182 Malignant neoplasm of ascending colon: Secondary | ICD-10-CM | POA: Diagnosis present

## 2020-10-27 DIAGNOSIS — D509 Iron deficiency anemia, unspecified: Secondary | ICD-10-CM | POA: Diagnosis not present

## 2020-10-27 DIAGNOSIS — K6389 Other specified diseases of intestine: Secondary | ICD-10-CM | POA: Diagnosis not present

## 2020-10-27 DIAGNOSIS — R195 Other fecal abnormalities: Secondary | ICD-10-CM

## 2020-10-27 DIAGNOSIS — K9189 Other postprocedural complications and disorders of digestive system: Secondary | ICD-10-CM | POA: Diagnosis not present

## 2020-10-27 DIAGNOSIS — Z90710 Acquired absence of both cervix and uterus: Secondary | ICD-10-CM | POA: Diagnosis not present

## 2020-10-27 DIAGNOSIS — I872 Venous insufficiency (chronic) (peripheral): Secondary | ICD-10-CM | POA: Diagnosis present

## 2020-10-27 DIAGNOSIS — D500 Iron deficiency anemia secondary to blood loss (chronic): Secondary | ICD-10-CM | POA: Diagnosis not present

## 2020-10-27 DIAGNOSIS — I110 Hypertensive heart disease with heart failure: Secondary | ICD-10-CM | POA: Diagnosis present

## 2020-10-27 DIAGNOSIS — K635 Polyp of colon: Secondary | ICD-10-CM | POA: Diagnosis not present

## 2020-10-27 DIAGNOSIS — Z7901 Long term (current) use of anticoagulants: Secondary | ICD-10-CM | POA: Diagnosis not present

## 2020-10-27 DIAGNOSIS — D62 Acute posthemorrhagic anemia: Secondary | ICD-10-CM | POA: Diagnosis present

## 2020-10-27 DIAGNOSIS — E785 Hyperlipidemia, unspecified: Secondary | ICD-10-CM | POA: Diagnosis present

## 2020-10-27 DIAGNOSIS — K565 Intestinal adhesions [bands], unspecified as to partial versus complete obstruction: Secondary | ICD-10-CM | POA: Diagnosis present

## 2020-10-27 DIAGNOSIS — F419 Anxiety disorder, unspecified: Secondary | ICD-10-CM | POA: Diagnosis present

## 2020-10-27 DIAGNOSIS — K922 Gastrointestinal hemorrhage, unspecified: Secondary | ICD-10-CM | POA: Diagnosis not present

## 2020-10-27 DIAGNOSIS — K567 Ileus, unspecified: Secondary | ICD-10-CM | POA: Diagnosis not present

## 2020-10-27 DIAGNOSIS — I6529 Occlusion and stenosis of unspecified carotid artery: Secondary | ICD-10-CM | POA: Diagnosis present

## 2020-10-27 DIAGNOSIS — D649 Anemia, unspecified: Secondary | ICD-10-CM

## 2020-10-27 DIAGNOSIS — Q438 Other specified congenital malformations of intestine: Secondary | ICD-10-CM | POA: Diagnosis not present

## 2020-10-27 DIAGNOSIS — D539 Nutritional anemia, unspecified: Secondary | ICD-10-CM | POA: Diagnosis present

## 2020-10-27 DIAGNOSIS — I480 Paroxysmal atrial fibrillation: Secondary | ICD-10-CM

## 2020-10-27 DIAGNOSIS — I5031 Acute diastolic (congestive) heart failure: Secondary | ICD-10-CM | POA: Diagnosis not present

## 2020-10-27 DIAGNOSIS — J189 Pneumonia, unspecified organism: Secondary | ICD-10-CM | POA: Diagnosis not present

## 2020-10-27 DIAGNOSIS — E43 Unspecified severe protein-calorie malnutrition: Secondary | ICD-10-CM | POA: Diagnosis present

## 2020-10-27 DIAGNOSIS — Z20822 Contact with and (suspected) exposure to covid-19: Secondary | ICD-10-CM | POA: Diagnosis present

## 2020-10-27 DIAGNOSIS — J690 Pneumonitis due to inhalation of food and vomit: Secondary | ICD-10-CM | POA: Diagnosis not present

## 2020-10-27 DIAGNOSIS — C185 Malignant neoplasm of splenic flexure: Secondary | ICD-10-CM | POA: Diagnosis present

## 2020-10-27 DIAGNOSIS — L89312 Pressure ulcer of right buttock, stage 2: Secondary | ICD-10-CM | POA: Diagnosis not present

## 2020-10-27 DIAGNOSIS — R0602 Shortness of breath: Secondary | ICD-10-CM

## 2020-10-27 DIAGNOSIS — C180 Malignant neoplasm of cecum: Secondary | ICD-10-CM | POA: Diagnosis present

## 2020-10-27 LAB — CBC
HCT: 27.8 % — ABNORMAL LOW (ref 36.0–46.0)
Hemoglobin: 8 g/dL — ABNORMAL LOW (ref 12.0–15.0)
MCH: 21.7 pg — ABNORMAL LOW (ref 26.0–34.0)
MCHC: 28.8 g/dL — ABNORMAL LOW (ref 30.0–36.0)
MCV: 75.5 fL — ABNORMAL LOW (ref 80.0–100.0)
Platelets: 305 10*3/uL (ref 150–400)
RBC: 3.68 MIL/uL — ABNORMAL LOW (ref 3.87–5.11)
RDW: 15.5 % (ref 11.5–15.5)
WBC: 6.3 10*3/uL (ref 4.0–10.5)
nRBC: 0 % (ref 0.0–0.2)

## 2020-10-27 LAB — BASIC METABOLIC PANEL
Anion gap: 5 (ref 5–15)
BUN: 11 mg/dL (ref 8–23)
CO2: 27 mmol/L (ref 22–32)
Calcium: 9.6 mg/dL (ref 8.9–10.3)
Chloride: 105 mmol/L (ref 98–111)
Creatinine, Ser: 0.62 mg/dL (ref 0.44–1.00)
GFR, Estimated: >60 mL/min (ref >60)
Glucose, Bld: 100 mg/dL — ABNORMAL HIGH (ref 70–99)
Potassium: 3.2 mmol/L — ABNORMAL LOW (ref 3.5–5.1)
Sodium: 137 mmol/L (ref 135–145)

## 2020-10-27 MED ORDER — FERROUS SULFATE 325 (65 FE) MG PO TABS
325.0000 mg | ORAL_TABLET | Freq: Two times a day (BID) | ORAL | Status: DC
  Administered 2020-10-27 – 2020-11-02 (×9): 325 mg via ORAL
  Filled 2020-10-27 (×11): qty 1

## 2020-10-27 MED ORDER — TRIAMTERENE-HCTZ 37.5-25 MG PO TABS
1.0000 | ORAL_TABLET | Freq: Every day | ORAL | Status: DC
  Administered 2020-10-27 – 2020-10-29 (×3): 1 via ORAL
  Filled 2020-10-27 (×4): qty 1

## 2020-10-27 MED ORDER — LUTEIN 20 MG PO CAPS: 1.0000 | ORAL_CAPSULE | Freq: Every evening | ORAL | Status: DC

## 2020-10-27 MED ORDER — DIPHENOXYLATE-ATROPINE 2.5-0.025 MG PO TABS: 1.0000 | ORAL_TABLET | Freq: Every day | ORAL | Status: DC | PRN

## 2020-10-27 MED ORDER — PEG-KCL-NACL-NASULF-NA ASC-C 100 G PO SOLR
0.5000 | Freq: Once | ORAL | Status: AC
  Administered 2020-10-27: 100 g via ORAL
  Filled 2020-10-27: qty 1

## 2020-10-27 MED ORDER — DILTIAZEM HCL ER COATED BEADS 180 MG PO CP24
180.0000 mg | ORAL_CAPSULE | Freq: Every day | ORAL | Status: DC
  Administered 2020-10-27 – 2020-11-02 (×7): 180 mg via ORAL
  Filled 2020-10-27 (×9): qty 1

## 2020-10-27 MED ORDER — POTASSIUM CHLORIDE CRYS ER 20 MEQ PO TBCR
20.0000 meq | EXTENDED_RELEASE_TABLET | Freq: Once | ORAL | Status: AC
  Administered 2020-10-27: 20 meq via ORAL
  Filled 2020-10-27: qty 1

## 2020-10-27 MED ORDER — PEG-KCL-NACL-NASULF-NA ASC-C 100 G PO SOLR: 1.0000 | Freq: Once | ORAL | Status: DC

## 2020-10-27 MED ORDER — POTASSIUM CHLORIDE 2 MEQ/ML IV SOLN
INTRAVENOUS | Status: AC
  Filled 2020-10-27 (×2): qty 1000

## 2020-10-27 MED ORDER — ATORVASTATIN CALCIUM 10 MG PO TABS
5.0000 mg | ORAL_TABLET | ORAL | Status: DC
  Administered 2020-11-02: 5 mg via ORAL
  Filled 2020-10-27 (×3): qty 1

## 2020-10-27 MED ORDER — DILTIAZEM HCL ER BEADS 180 MG PO CP24: 180.0000 mg | ORAL_CAPSULE | Freq: Every day | ORAL | Status: DC

## 2020-10-27 MED ORDER — PEG-KCL-NACL-NASULF-NA ASC-C 100 G PO SOLR
0.5000 | Freq: Once | ORAL | Status: AC
  Administered 2020-10-28: 100 g via ORAL

## 2020-10-27 MED ORDER — PROSIGHT PO TABS
1.0000 | ORAL_TABLET | Freq: Every day | ORAL | Status: DC
  Administered 2020-10-27 – 2020-11-02 (×7): 1 via ORAL
  Filled 2020-10-27 (×8): qty 1

## 2020-10-27 MED ORDER — ESTRADIOL 1 MG PO TABS
0.5000 mg | ORAL_TABLET | Freq: Every day | ORAL | Status: DC
  Administered 2020-10-27 – 2020-11-02 (×6): 0.5 mg via ORAL
  Filled 2020-10-27 (×9): qty 0.5

## 2020-10-27 MED ORDER — POTASSIUM CHLORIDE CRYS ER 20 MEQ PO TBCR
20.0000 meq | EXTENDED_RELEASE_TABLET | Freq: Every day | ORAL | Status: DC
  Administered 2020-10-28 – 2020-11-02 (×5): 20 meq via ORAL
  Filled 2020-10-27 (×6): qty 1

## 2020-10-27 MED ORDER — VITAMIN D3 25 MCG (1000 UNIT) PO TABS
2000.0000 [IU] | ORAL_TABLET | Freq: Every day | ORAL | Status: DC
  Administered 2020-10-27 – 2020-11-01 (×6): 2000 [IU] via ORAL
  Filled 2020-10-27 (×6): qty 2

## 2020-10-27 MED ORDER — SODIUM CHLORIDE 0.9 % IV SOLN
510.0000 mg | Freq: Once | INTRAVENOUS | Status: AC
  Administered 2020-10-27: 510 mg via INTRAVENOUS
  Filled 2020-10-27: qty 510

## 2020-10-27 NOTE — Progress Notes (Signed)
PROGRESS NOTE  Samaa E Wilinski  DOB: Jun 26, 1934  PCP: [name], PA-C SVX:[id number]  DOA: 10/26/2020  LOS: 0 days  Hospital Day: 2  Chief complaint: Anemia  Brief narrative: Robin [name] is a 86 y.o. female with PMH significant for A.Fib on eliquis, HTN. Patient was sent to the ED by PCP on 5/11 for intermittent bloody stools, bloody vomiting, shortness of breath and anemia with a hemoglobin low at 8 while on Eliquis.   In the ED, patient was afebrile, blood pressure elevated to 158/88, breathing on room air Labs with hemoglobin at 8.5, MCV 75, ferritin significantly low at 5, FOBT positive.  Subjective: Patient was seen and examined this afternoon.  Pleasant elderly Caucasian female.  Propped up in bed.  Not in distress.  Daughter at bedside.  Getting IV iron at this time. Remains hemodynamically stable. Labs this morning with hemoglobin down at 8, potassium low at 3.2  Assessment/Plan: Acute insidious GI bleeding -Presented with intermittent bloody stool, bloody vomiting, low hemoglobin and FOBT positive -Eliquis on hold. -GI consulted.  Noted a tentative plan of EGD and colonoscopy tomorrow. -Currently on clear liquid diet  Acute blood loss anemia Severe iron deficiency -Hemoglobin from 2019 was 13.7, now with a low hemoglobin 8 and low ferritin of 5 -Secondary to GI bleeding -Transfuse if less than 7. -We will give IV iron while in the hospital.  Oral iron at discharge.  Paroxysmal A. fib -Continue Toprol 50 mg twice daily and Tiadylt (diltiazem) 180 mg daily. -Eliquis on hold. -Continue to monitor in telemetry.  Essential hypertension -On metoprolol, diltiazem, triamterene and HCTZ -Continue the same.  Continue to monitor blood pressure  Hypokalemia -Potassium level low at 3.2.  Oral replacement given.  Continue to monitor. Recent Labs  Lab 10/26/20 1919 10/27/20 0551  K 3.3* 3.2*   Mobility: Encourage ambulation Code Status:   Code  Status: Full Code  Nutritional status: Body mass index is 24.98 kg/m.     Diet Order            Diet NPO time specified Except for: Sips with Meds  Diet effective ____           Diet clear liquid Room service appropriate? Yes; Fluid consistency: Thin  Diet effective now                 DVT prophylaxis: SCDs Start: 10/26/20 2337   Antimicrobials:  None Fluid: IV iron Consultants: GI Family Communication:  Daughter at bedside  Status is: Inpatient  Remains inpatient appropriate because: Pending procedure tomorrow  Dispo: The patient is from: Home              Anticipated d/c is to: Home hopefully in 1 to 2 days              Patient currently is not medically stable to d/c.   Difficult to place patient No     Infusions:    Scheduled Meds: . [START ON 10/28/2020] atorvastatin  5 mg Oral Once per day on Mon Wed Fri  . cholecalciferol  2,000 Units Oral QHS  . diltiazem  180 mg Oral Daily  . estradiol  0.5 mg Oral Daily  . ferrous sulfate  325 mg Oral BID WC  . metoprolol succinate  50 mg Oral BID  . multivitamin  1 tablet Oral Daily  . peg 3350 powder  0.5 kit Oral Once   And  . [START ON 10/28/2020] peg 3350 powder  0.5 kit  Oral Once  . potassium chloride SA  20 mEq Oral Daily  . triamterene-hydrochlorothiazide  1 tablet Oral Daily    Antimicrobials: Anti-infectives (From admission, onward)   None      PRN meds: acetaminophen **OR** acetaminophen, diphenoxylate-atropine, ondansetron **OR** ondansetron (ZOFRAN) IV   Objective: Vitals:   10/27/20 1147 10/27/20 1316  BP:  (!) 141/75  Pulse: 70 75  Resp:  16  Temp:  98.4 F (36.9 C)  SpO2:  100%   No intake or output data in the 24 hours ending 10/27/20 1517 Filed Weights   10/26/20 1729  Weight: 66 kg   Weight change:  Body mass index is 24.98 kg/m.   Physical Exam: General exam: Pleasant, elderly Caucasian female.  Not in distress Skin: No rashes, lesions or ulcers. HEENT: Atraumatic,  normocephalic, no obvious bleeding Lungs: Clear to auscultation bilaterally CVS: Regular rate and rhythm, no murmur GI/Abd soft, nontender, nondistended, bowel sound present CNS: Alert, awake, oriented x3 Psychiatry: Mood appropriate Extremities: No pedal edema, no calf redness  Data Review: I have personally reviewed the laboratory data and studies available.  Recent Labs  Lab 10/26/20 1919 10/27/20 0551  WBC 6.3 6.3  NEUTROABS 3.9  --   HGB 8.5* 8.0*  HCT 29.7* 27.8*  MCV 75.0* 75.5*  PLT 353 305   Recent Labs  Lab 10/26/20 1919 10/27/20 0551  NA 135 137  K 3.3* 3.2*  CL 103 105  CO2 28 27  GLUCOSE 106* 100*  BUN 12 11  CREATININE 0.55 0.62  CALCIUM 10.1 9.6    F/u labs ordered Unresulted Labs (From admission, onward)         None      Signed, Terrilee Croak, MD [facility] 10/27/2020

## 2020-10-27 NOTE — ED Notes (Signed)
Pt ambulated to restroom w/o standby assistance. Breakfast tray at bedside.

## 2020-10-27 NOTE — ED Notes (Signed)
This nurse spoke with Ron Parker for [facility]. Per Nevin Bloodgood, pt cleared for PO medications and clear liquid diet at this time. D/t pt's previous Eliquis use, EGD and colonoscopy will not be performed today. Dr. Pietro Cassis, Hospitalist notified and aware.

## 2020-10-27 NOTE — ED Notes (Signed)
Pt A&O x4. Attached to cardiac monitor x3. VSS at this time. Pt denies any complaints or concerns.

## 2020-10-27 NOTE — ED Notes (Signed)
Verified with pharmacy adjustment of administration time for pt's Metoprolol, given she received it today at 1236AM. Pharmacy to send pt's Estrace, Multivitamin, and Triamterene-HCTZ.

## 2020-10-27 NOTE — Consult Note (Signed)
Referring Provider:  [facility]         Primary Care Physician:  Robin Stalker, PA-C Primary Gastroenterologist:  Previously Dr. Sharlett Iles           We were asked to see this patient for:   Anemia , FOBT+               ASSESSMENT / PLAN:   # 86 yo female with heme positive IDA on Eliquis. Rule out gastrointestinal neoplasm, AVM, polyps.  --Patient will be scheduled for EGD and colonoscopy. The risks and benefits of EGD and colonoscopy with possible biopsies were discussed with the patient and she agree to proceed.   # AFIB on Eliquis, last dose Wed morning.   # Chronic intermittent loose stool, generally diet dependent. Takes Imodium as needed. Probably unrelated but she had non-specific inflammation of left colon on colonoscopy in 2007    HPI:                                                                                                                             Chief Complaint:  anemia  Robin [name] is a 86 y.o. female with a past medical history significant for atrial fibrillation on Eliquis, hypertension, hyperlipidemia, mild carotid stenosis, chronic intermittent loose stool, hysterectomy and cholecystectomy  Patient came to ED yesterday with symptomatic anemia. Labs at PCP's office showed a hgb of 8. Patient takes Eliquis. No recent labs in Epic to compare but patient says her hemoglobin was 12 in January . In the ED her hgb was 8.6, MCV 75. Ferritin 5. Stool heme positive.  Renal function normal.   [name] says she began feeling fatigued and short of breath about 2 weeks ago. Denies overt GI blood loss.  No abdominal pain, nausea, vomiting or other GI symptoms. She gives a history of intermittent loose stool, usually diet dependent. She takes Imodium as needed.  She has not had a colonoscopy since the one we did in 2007.  It does sound like she subsequently completed some stool cards for occult blood and they were negative.  She does not take any NSAIDs.   No family history of colon cancer.    PREVIOUS ENDOSCOPIC EVALUATIONS / [place] STUDIES    2007 Screening colonoscopy  --complete exam, good prep --descending colon colitis.   Biopsies - diffuse, non-specific mid inflammation.    Past Medical History:  Diagnosis Date  . Anxiety   . AV bloc first degree   . Carotid stenosis 03/02/2017  . Hyperlipidemia   . Hypertension   . Lumbar pain    fell off of her horse and has low back pain at times  . Venous insufficiency of both lower extremities   . Vertigo     Past Surgical History:  Procedure Laterality Date  . ABDOMINAL HYSTERECTOMY    . BREAST BIOPSY Left   . BREAST EXCISIONAL BIOPSY Right   . BREAST LUMPECTOMY WITH RADIOACTIVE SEED  LOCALIZATION Left 05/05/2015   Procedure: LEFT BREAST LUMPECTOMY WITH RADIOACTIVE SEED LOCALIZATION;  Surgeon: Autumn Messing III, MD;  Location: [place];  Service: General;  Laterality: Left;  . CHOLECYSTECTOMY    . EYE SURGERY     bil cataract surgery  . LUNG SURGERY      Prior to Admission medications   Medication Sig Start Date End Date Taking? Authorizing Provider  atorvastatin (LIPITOR) 10 MG tablet Take 0.5 tablets by mouth 3 (three) times a week. 09/02/20  Yes [provider]  Cholecalciferol (VITAMIN D) 50 MCG (2000 UT) tablet Take 2,000 Units by mouth at bedtime.   Yes [provider]  diphenoxylate-atropine (LOMOTIL) 2.5-0.025 MG per tablet Take 1 tablet by mouth daily as needed (for diarrhea).  08/27/14  Yes [provider]  ELIQUIS 5 MG TABS tablet TAKE 1 TABLET BY MOUTH TWICE DAILY. 10/06/20  Yes Skeet Latch, MD  estradiol (ESTRACE) 1 MG tablet Take 0.5 mg by mouth daily. 08/24/20  Yes [provider]  Lactobacillus Rhamnosus, GG, (CULTURELLE PO) Take 1 capsule by mouth every morning.   Yes [provider]  Lutein 20 MG CAPS Take 1 capsule by mouth every evening.   Yes [provider]  metoprolol succinate  (TOPROL-XL) 50 MG 24 hr tablet Take 50 mg by mouth 2 (two) times daily. 10/03/20  Yes [provider]  potassium chloride SA (KLOR-CON) 20 MEQ tablet TAKE 1 TABLET BY MOUTH TWICE DAILY. Patient taking differently: Take 20 mEq by mouth daily. 04/22/20  Yes Skeet Latch, MD  TIADYLT ER 180 MG 24 hr capsule TAKE (1) CAPSULE DAILY. Patient taking differently: Take 180 mg by mouth daily. 09/07/20  Yes Skeet Latch, MD  tiZANidine (ZANAFLEX) 2 MG tablet Take 2 mg by mouth every 8 (eight) hours as needed for muscle spasms. 10/03/20  Yes [provider]  triamcinolone cream (KENALOG) 0.1 % Apply 1 application topically daily as needed (for rash).  09/13/14  Yes [provider]  triamterene-hydrochlorothiazide (MAXZIDE-25) 37.5-25 MG tablet Take 1 tablet by mouth daily. 08/17/20  Yes [provider]  vitamin A 10000 UNIT capsule Take 10,000 Units by mouth daily.   Yes [provider]    Current Facility-Administered Medications  Medication Dose Route Frequency Provider Last Rate Last Admin  . acetaminophen (TYLENOL) tablet 650 mg  650 mg Oral Q6H PRN Etta Quill, DO       Or  . acetaminophen (TYLENOL) suppository 650 mg  650 mg Rectal Q6H PRN Etta Quill, DO      . [START ON 10/28/2020] atorvastatin (LIPITOR) tablet 5 mg  5 mg Oral Once per day on Mon Wed Fri Gardner, Jared M, DO      . cholecalciferol (VITAMIN D) tablet 2,000 Units  2,000 Units Oral QHS Jennette Kettle M, DO      . diltiazem (CARDIZEM CD) 24 hr capsule 180 mg  180 mg Oral Daily Alcario Drought, Jared M, DO      . diphenoxylate-atropine (LOMOTIL) 2.5-0.025 MG per tablet 1 tablet  1 tablet Oral Daily PRN Etta Quill, DO      . estradiol (ESTRACE) tablet 0.5 mg  0.5 mg Oral Daily Alcario Drought, Jared M, DO      . ferrous sulfate tablet 325 mg  325 mg Oral BID WC Jennette Kettle M, DO      . metoprolol succinate (TOPROL-XL) 24 hr tablet 50 mg  50 mg Oral BID Etta Quill, DO   50  mg at  10/27/20 0036  . multivitamin (PROSIGHT) tablet 1 tablet  1 tablet Oral Daily Jennette Kettle M, DO      . ondansetron [facility]) tablet 4 mg  4 mg Oral Q6H PRN Etta Quill, DO       Or  . ondansetron [facility]) injection 4 mg  4 mg Intravenous Q6H PRN Etta Quill, DO      . potassium chloride SA (KLOR-CON) CR tablet 20 mEq  20 mEq Oral Daily Alcario Drought, Jared M, DO      . triamterene-hydrochlorothiazide (MAXZIDE-25) 37.5-25 MG per tablet 1 tablet  1 tablet Oral Daily Etta Quill, DO       Current Outpatient Medications  Medication Sig Dispense Refill  . atorvastatin (LIPITOR) 10 MG tablet Take 0.5 tablets by mouth 3 (three) times a week.    . Cholecalciferol (VITAMIN D) 50 MCG (2000 UT) tablet Take 2,000 Units by mouth at bedtime.    . diphenoxylate-atropine (LOMOTIL) 2.5-0.025 MG per tablet Take 1 tablet by mouth daily as needed (for diarrhea).     Arne Cleveland 5 MG TABS tablet TAKE 1 TABLET BY MOUTH TWICE DAILY. 180 tablet 1  . estradiol (ESTRACE) 1 MG tablet Take 0.5 mg by mouth daily.    . Lactobacillus Rhamnosus, GG, (CULTURELLE PO) Take 1 capsule by mouth every morning.    . Lutein 20 MG CAPS Take 1 capsule by mouth every evening.    . metoprolol succinate (TOPROL-XL) 50 MG 24 hr tablet Take 50 mg by mouth 2 (two) times daily.    . potassium chloride SA (KLOR-CON) 20 MEQ tablet TAKE 1 TABLET BY MOUTH TWICE DAILY. (Patient taking differently: Take 20 mEq by mouth daily.) 180 tablet 2  . TIADYLT ER 180 MG 24 hr capsule TAKE (1) CAPSULE DAILY. (Patient taking differently: Take 180 mg by mouth daily.) 90 capsule 2  . tiZANidine (ZANAFLEX) 2 MG tablet Take 2 mg by mouth every 8 (eight) hours as needed for muscle spasms.    Marland Kitchen triamcinolone cream (KENALOG) 0.1 % Apply 1 application topically daily as needed (for rash).     . triamterene-hydrochlorothiazide (MAXZIDE-25) 37.5-25 MG tablet Take 1 tablet by mouth daily.    . vitamin A 10000 UNIT capsule Take 10,000 Units by mouth daily.       Allergies as of 10/26/2020 - Review Complete 10/26/2020  Allergen Reaction Noted  . Iodinated diagnostic agents Shortness Of Breath 12/05/2010  . Amlodipine  05/07/2013  . Codeine Nausea Only 12/05/2010  . Diphenhydramine Other (See Comments) 01/29/2011  . Diphenhydramine hcl  08/05/2015  . Pravastatin  12/05/2010  . Zetia [ezetimibe]  03/09/2020  . Sulfa antibiotics Rash 03/02/2014  . Sulfa drugs cross reactors Rash 12/05/2010    Family History  Problem Relation Age of Onset  . Hypertension Mother   . Stroke Mother   . Heart disease Mother   . Heart disease Father   . Hypertension Sister   . Cancer Sister   . Hypertension Brother   . Hypertension Sister   . Hypertension Sister   . Breast cancer Neg Hx     Social History   Socioeconomic History  . Marital status: Married    Spouse name: Not on file  . Number of children: Not on file  . Years of education: Not on file  . Highest education level: Not on file  Occupational History  . Not on file  Tobacco Use  . Smoking status: Never Smoker  . Smokeless tobacco:  Never Used  Vaping Use  . Vaping Use: Never used  Substance and Sexual Activity  . Alcohol use: Yes    Comment: social  . Drug use: No  . Sexual activity: Not on file  Other Topics Concern  . Not on file  Social History Narrative  . Not on file   Social Determinants of Health   Financial Resource Strain: Not on file  Food Insecurity: Not on file  Transportation Needs: Not on file  Physical Activity: Not on file  Stress: Not on file  Social Connections: Not on file  Intimate Partner Violence: Not on file    Review of Systems: All systems reviewed and negative except where noted in HPI.  OBJECTIVE:    Physical Exam: Vital signs in last 24 hours: Temp:  [97.7 F (36.5 C)-97.8 F (36.6 C)] 97.8 F (36.6 C) (05/12 0734) Pulse Rate:  [47-98] 65 (05/12 0734) Resp:  [11-21] 16 (05/12 0734) BP: (101-161)/(55-88) 134/82 (05/12 0734) SpO2:   [93 %-100 %] 100 % (05/12 0734) Weight:  [66 kg] 66 kg (05/11 1729)   General:   Alert  female in NAD Psych:  Pleasant, cooperative. Normal mood and affect. Eyes:  Pupils equal, sclera clear, no icterus.   Conjunctiva pink. Ears:  Normal auditory acuity. Nose:  No deformity, discharge,  or lesions. Neck:  Supple; no masses Lungs:  Clear throughout to auscultation.   No wheezes, crackles, or rhonchi.  Heart:  Regular rate and rhythm, failure and cirrhosis no lower extremity edema Abdomen:  Soft, non-distended, nontender, BS active, no palp mass   Rectal:  Deferred  Msk:  Symmetrical without gross deformities. . Neurologic:  Alert and  oriented x4;  grossly normal neurologically. Skin:  Intact without significant lesions or rashes.  Filed Weights   10/26/20 1729  Weight: 66 kg     Scheduled inpatient medications . [START ON 10/28/2020] atorvastatin  5 mg Oral Once per day on Mon Wed Fri  . cholecalciferol  2,000 Units Oral QHS  . diltiazem  180 mg Oral Daily  . estradiol  0.5 mg Oral Daily  . ferrous sulfate  325 mg Oral BID WC  . metoprolol succinate  50 mg Oral BID  . multivitamin  1 tablet Oral Daily  . potassium chloride SA  20 mEq Oral Daily  . triamterene-hydrochlorothiazide  1 tablet Oral Daily      Intake/Output from previous day: No intake/output data recorded. Intake/Output this shift: No intake/output data recorded.   Lab Results: Recent Labs    10/26/20 1919 10/27/20 0551  WBC 6.3 6.3  HGB 8.5* 8.0*  HCT 29.7* 27.8*  PLT 353 305   BMET Recent Labs    10/26/20 1919 10/27/20 0551  NA 135 137  K 3.3* 3.2*  CL 103 105  CO2 28 27  GLUCOSE 106* 100*  BUN 12 11  CREATININE 0.55 0.62  CALCIUM 10.1 9.6   LFT Recent Labs    10/26/20 1919  PROT 6.7  ALBUMIN 3.8  AST 18  ALT 14  ALKPHOS 104  BILITOT 1.1   PT/INR Recent Labs    10/26/20 1930  LABPROT 16.9*  INR 1.4*   Hepatitis Panel No results for input(s): HEPBSAG, HCVAB, HEPAIGM,  HEPBIGM in the last 72 hours.   . CBC Latest Ref Rng & Units 10/27/2020 10/26/2020 02/21/2018  WBC 4.0 - 10.5 K/uL 6.3 6.3 6.8  Hemoglobin 12.0 - 15.0 g/dL 8.0(L) 8.5(L) 13.7  Hematocrit 36.0 - 46.0 % 27.8(L) 29.7(L) 39.6  Platelets 150 - 400 K/uL 305 353 257    . CMP Latest Ref Rng & Units 10/27/2020 10/26/2020 02/21/2018  Glucose 70 - 99 mg/dL 100(H) 106(H) 95  BUN 8 - 23 mg/dL 11 12 20   Creatinine 0.44 - 1.00 mg/dL 0.62 0.55 0.82  Sodium 135 - 145 mmol/L 137 135 139  Potassium 3.5 - 5.1 mmol/L 3.2(L) 3.3(L) 4.3  Chloride 98 - 111 mmol/L 105 103 99  CO2 22 - 32 mmol/L 27 28 27   Calcium 8.9 - 10.3 mg/dL 9.6 10.1 10.4(H)  Total Protein 6.5 - 8.1 g/dL - 6.7 -  Total Bilirubin 0.3 - 1.2 mg/dL - 1.1 -  Alkaline Phos 38 - 126 U/L - 104 -  AST 15 - 41 U/L - 18 -  ALT 0 - 44 U/L - 14 -   Studies/Results: DG Chest 2 View  Result Date: 10/26/2020 CLINICAL DATA:  Shortness of breath.  Fatigue. EXAM: CHEST - 2 VIEW COMPARISON:  Radiograph 08/19/2015.  CT 12/16/2017 FINDINGS: Upper normal heart size with normal mediastinal contours. No pulmonary edema, pleural effusion, or pneumothorax. No focal airspace disease. Mild biapical pleuroparenchymal scarring. Pulmonary nodules on prior CT are not well seen by radiograph. Chronic lower thoracic compression fracture, unchanged. IMPRESSION: No acute chest findings. Electronically Signed   By: Keith Rake M.D.   On: 10/26/2020 19:43    Principal Problem:   Anemia Active Problems:   Paroxysmal atrial fibrillation (HCC)   Chronic anticoagulation   GI bleed    Tye Savoy, NP-C @  10/27/2020, 10:11 AM

## 2020-10-27 NOTE — ED Notes (Signed)
GI at the bedside.

## 2020-10-27 NOTE — ED Notes (Signed)
Consult placed to GI at this time, per Dr. Pietro Cassis, hospitalist, regarding potential for EGD today. Per Dr. Pietro Cassis, holding PO medications at this time until GI returns call regarding EGD.

## 2020-10-27 NOTE — ED Notes (Signed)
This nurse spoke with Dr. Pietro Cassis, hospitalist. Per Dr. Pietro Cassis, pt is to remain NPO at this time d/t potential for scope. This nurse will contact GI regarding plan for scope. No additional orders at this time. Will continue to monitor.

## 2020-10-27 NOTE — ED Notes (Signed)
Relayed to both pt and ED tech, pt is NPO at this time per hospitalist.

## 2020-10-27 NOTE — Progress Notes (Signed)
Getting first dose of iron today in hospital. Will need dose # 2 in about a week.

## 2020-10-27 NOTE — H&P (View-Only) (Signed)
Referring Provider:  [facility]         Primary Care Physician:  Robin Stalker, PA-C Primary Gastroenterologist:  Previously Dr. Sharlett Iles           We were asked to see this patient for:   Anemia , FOBT+               ASSESSMENT / PLAN:   # 86 yo female with heme positive IDA on Eliquis. Rule out gastrointestinal neoplasm, AVM, polyps.  --Patient will be scheduled for EGD and colonoscopy. The risks and benefits of EGD and colonoscopy with possible biopsies were discussed with the patient and she agree to proceed.   # AFIB on Eliquis, last dose Wed morning.   # Chronic intermittent loose stool, generally diet dependent. Takes Imodium as needed. Probably unrelated but she had non-specific inflammation of left colon on colonoscopy in 2007    HPI:                                                                                                                             Chief Complaint:  anemia  Robin [name] is a 86 y.o. female with a past medical history significant for atrial fibrillation on Eliquis, hypertension, hyperlipidemia, mild carotid stenosis, chronic intermittent loose stool, hysterectomy and cholecystectomy  Patient came to ED yesterday with symptomatic anemia. Labs at PCP's office showed a hgb of 8. Patient takes Eliquis. No recent labs in Epic to compare but patient says her hemoglobin was 12 in January . In the ED her hgb was 8.6, MCV 75. Ferritin 5. Stool heme positive.  Renal function normal.   [name] says she began feeling fatigued and short of breath about 2 weeks ago. Denies overt GI blood loss.  No abdominal pain, nausea, vomiting or other GI symptoms. She gives a history of intermittent loose stool, usually diet dependent. She takes Imodium as needed.  She has not had a colonoscopy since the one we did in 2007.  It does sound like she subsequently completed some stool cards for occult blood and they were negative.  She does not take any NSAIDs.   No family history of colon cancer.    PREVIOUS ENDOSCOPIC EVALUATIONS / [place] STUDIES    2007 Screening colonoscopy  --complete exam, good prep --descending colon colitis.   Biopsies - diffuse, non-specific mid inflammation.    Past Medical History:  Diagnosis Date  . Anxiety   . AV bloc first degree   . Carotid stenosis 03/02/2017  . Hyperlipidemia   . Hypertension   . Lumbar pain    fell off of her horse and has low back pain at times  . Venous insufficiency of both lower extremities   . Vertigo     Past Surgical History:  Procedure Laterality Date  . ABDOMINAL HYSTERECTOMY    . BREAST BIOPSY Left   . BREAST EXCISIONAL BIOPSY Right   . BREAST LUMPECTOMY WITH RADIOACTIVE SEED  LOCALIZATION Left 05/05/2015   Procedure: LEFT BREAST LUMPECTOMY WITH RADIOACTIVE SEED LOCALIZATION;  Surgeon: Autumn Messing III, MD;  Location: [place];  Service: General;  Laterality: Left;  . CHOLECYSTECTOMY    . EYE SURGERY     bil cataract surgery  . LUNG SURGERY      Prior to Admission medications   Medication Sig Start Date End Date Taking? Authorizing Provider  atorvastatin (LIPITOR) 10 MG tablet Take 0.5 tablets by mouth 3 (three) times a week. 09/02/20  Yes [provider]  Cholecalciferol (VITAMIN D) 50 MCG (2000 UT) tablet Take 2,000 Units by mouth at bedtime.   Yes [provider]  diphenoxylate-atropine (LOMOTIL) 2.5-0.025 MG per tablet Take 1 tablet by mouth daily as needed (for diarrhea).  08/27/14  Yes [provider]  ELIQUIS 5 MG TABS tablet TAKE 1 TABLET BY MOUTH TWICE DAILY. 10/06/20  Yes Skeet Latch, MD  estradiol (ESTRACE) 1 MG tablet Take 0.5 mg by mouth daily. 08/24/20  Yes [provider]  Lactobacillus Rhamnosus, GG, (CULTURELLE PO) Take 1 capsule by mouth every morning.   Yes [provider]  Lutein 20 MG CAPS Take 1 capsule by mouth every evening.   Yes [provider]  metoprolol succinate  (TOPROL-XL) 50 MG 24 hr tablet Take 50 mg by mouth 2 (two) times daily. 10/03/20  Yes [provider]  potassium chloride SA (KLOR-CON) 20 MEQ tablet TAKE 1 TABLET BY MOUTH TWICE DAILY. Patient taking differently: Take 20 mEq by mouth daily. 04/22/20  Yes Skeet Latch, MD  TIADYLT ER 180 MG 24 hr capsule TAKE (1) CAPSULE DAILY. Patient taking differently: Take 180 mg by mouth daily. 09/07/20  Yes Skeet Latch, MD  tiZANidine (ZANAFLEX) 2 MG tablet Take 2 mg by mouth every 8 (eight) hours as needed for muscle spasms. 10/03/20  Yes [provider]  triamcinolone cream (KENALOG) 0.1 % Apply 1 application topically daily as needed (for rash).  09/13/14  Yes [provider]  triamterene-hydrochlorothiazide (MAXZIDE-25) 37.5-25 MG tablet Take 1 tablet by mouth daily. 08/17/20  Yes [provider]  vitamin A 10000 UNIT capsule Take 10,000 Units by mouth daily.   Yes [provider]    Current Facility-Administered Medications  Medication Dose Route Frequency Provider Last Rate Last Admin  . acetaminophen (TYLENOL) tablet 650 mg  650 mg Oral Q6H PRN Etta Quill, DO       Or  . acetaminophen (TYLENOL) suppository 650 mg  650 mg Rectal Q6H PRN Etta Quill, DO      . [START ON 10/28/2020] atorvastatin (LIPITOR) tablet 5 mg  5 mg Oral Once per day on Mon Wed Fri Gardner, Jared M, DO      . cholecalciferol (VITAMIN D) tablet 2,000 Units  2,000 Units Oral QHS Jennette Kettle M, DO      . diltiazem (CARDIZEM CD) 24 hr capsule 180 mg  180 mg Oral Daily Alcario Drought, Jared M, DO      . diphenoxylate-atropine (LOMOTIL) 2.5-0.025 MG per tablet 1 tablet  1 tablet Oral Daily PRN Etta Quill, DO      . estradiol (ESTRACE) tablet 0.5 mg  0.5 mg Oral Daily Alcario Drought, Jared M, DO      . ferrous sulfate tablet 325 mg  325 mg Oral BID WC Jennette Kettle M, DO      . metoprolol succinate (TOPROL-XL) 24 hr tablet 50 mg  50 mg Oral BID Etta Quill, DO   50  mg at  10/27/20 0036  . multivitamin (PROSIGHT) tablet 1 tablet  1 tablet Oral Daily Jennette Kettle M, DO      . ondansetron [facility]) tablet 4 mg  4 mg Oral Q6H PRN Etta Quill, DO       Or  . ondansetron [facility]) injection 4 mg  4 mg Intravenous Q6H PRN Etta Quill, DO      . potassium chloride SA (KLOR-CON) CR tablet 20 mEq  20 mEq Oral Daily Alcario Drought, Jared M, DO      . triamterene-hydrochlorothiazide (MAXZIDE-25) 37.5-25 MG per tablet 1 tablet  1 tablet Oral Daily Etta Quill, DO       Current Outpatient Medications  Medication Sig Dispense Refill  . atorvastatin (LIPITOR) 10 MG tablet Take 0.5 tablets by mouth 3 (three) times a week.    . Cholecalciferol (VITAMIN D) 50 MCG (2000 UT) tablet Take 2,000 Units by mouth at bedtime.    . diphenoxylate-atropine (LOMOTIL) 2.5-0.025 MG per tablet Take 1 tablet by mouth daily as needed (for diarrhea).     Arne Cleveland 5 MG TABS tablet TAKE 1 TABLET BY MOUTH TWICE DAILY. 180 tablet 1  . estradiol (ESTRACE) 1 MG tablet Take 0.5 mg by mouth daily.    . Lactobacillus Rhamnosus, GG, (CULTURELLE PO) Take 1 capsule by mouth every morning.    . Lutein 20 MG CAPS Take 1 capsule by mouth every evening.    . metoprolol succinate (TOPROL-XL) 50 MG 24 hr tablet Take 50 mg by mouth 2 (two) times daily.    . potassium chloride SA (KLOR-CON) 20 MEQ tablet TAKE 1 TABLET BY MOUTH TWICE DAILY. (Patient taking differently: Take 20 mEq by mouth daily.) 180 tablet 2  . TIADYLT ER 180 MG 24 hr capsule TAKE (1) CAPSULE DAILY. (Patient taking differently: Take 180 mg by mouth daily.) 90 capsule 2  . tiZANidine (ZANAFLEX) 2 MG tablet Take 2 mg by mouth every 8 (eight) hours as needed for muscle spasms.    Marland Kitchen triamcinolone cream (KENALOG) 0.1 % Apply 1 application topically daily as needed (for rash).     . triamterene-hydrochlorothiazide (MAXZIDE-25) 37.5-25 MG tablet Take 1 tablet by mouth daily.    . vitamin A 10000 UNIT capsule Take 10,000 Units by mouth daily.       Allergies as of 10/26/2020 - Review Complete 10/26/2020  Allergen Reaction Noted  . Iodinated diagnostic agents Shortness Of Breath 12/05/2010  . Amlodipine  05/07/2013  . Codeine Nausea Only 12/05/2010  . Diphenhydramine Other (See Comments) 01/29/2011  . Diphenhydramine hcl  08/05/2015  . Pravastatin  12/05/2010  . Zetia [ezetimibe]  03/09/2020  . Sulfa antibiotics Rash 03/02/2014  . Sulfa drugs cross reactors Rash 12/05/2010    Family History  Problem Relation Age of Onset  . Hypertension Mother   . Stroke Mother   . Heart disease Mother   . Heart disease Father   . Hypertension Sister   . Cancer Sister   . Hypertension Brother   . Hypertension Sister   . Hypertension Sister   . Breast cancer Neg Hx     Social History   Socioeconomic History  . Marital status: Married    Spouse name: Not on file  . Number of children: Not on file  . Years of education: Not on file  . Highest education level: Not on file  Occupational History  . Not on file  Tobacco Use  . Smoking status: Never Smoker  . Smokeless tobacco:  Never Used  Vaping Use  . Vaping Use: Never used  Substance and Sexual Activity  . Alcohol use: Yes    Comment: social  . Drug use: No  . Sexual activity: Not on file  Other Topics Concern  . Not on file  Social History Narrative  . Not on file   Social Determinants of Health   Financial Resource Strain: Not on file  Food Insecurity: Not on file  Transportation Needs: Not on file  Physical Activity: Not on file  Stress: Not on file  Social Connections: Not on file  Intimate Partner Violence: Not on file    Review of Systems: All systems reviewed and negative except where noted in HPI.  OBJECTIVE:    Physical Exam: Vital signs in last 24 hours: Temp:  [97.7 F (36.5 C)-97.8 F (36.6 C)] 97.8 F (36.6 C) (05/12 0734) Pulse Rate:  [47-98] 65 (05/12 0734) Resp:  [11-21] 16 (05/12 0734) BP: (101-161)/(55-88) 134/82 (05/12 0734) SpO2:   [93 %-100 %] 100 % (05/12 0734) Weight:  [66 kg] 66 kg (05/11 1729)   General:   Alert  female in NAD Psych:  Pleasant, cooperative. Normal mood and affect. Eyes:  Pupils equal, sclera clear, no icterus.   Conjunctiva pink. Ears:  Normal auditory acuity. Nose:  No deformity, discharge,  or lesions. Neck:  Supple; no masses Lungs:  Clear throughout to auscultation.   No wheezes, crackles, or rhonchi.  Heart:  Regular rate and rhythm, failure and cirrhosis no lower extremity edema Abdomen:  Soft, non-distended, nontender, BS active, no palp mass   Rectal:  Deferred  Msk:  Symmetrical without gross deformities. . Neurologic:  Alert and  oriented x4;  grossly normal neurologically. Skin:  Intact without significant lesions or rashes.  Filed Weights   10/26/20 1729  Weight: 66 kg     Scheduled inpatient medications . [START ON 10/28/2020] atorvastatin  5 mg Oral Once per day on Mon Wed Fri  . cholecalciferol  2,000 Units Oral QHS  . diltiazem  180 mg Oral Daily  . estradiol  0.5 mg Oral Daily  . ferrous sulfate  325 mg Oral BID WC  . metoprolol succinate  50 mg Oral BID  . multivitamin  1 tablet Oral Daily  . potassium chloride SA  20 mEq Oral Daily  . triamterene-hydrochlorothiazide  1 tablet Oral Daily      Intake/Output from previous day: No intake/output data recorded. Intake/Output this shift: No intake/output data recorded.   Lab Results: Recent Labs    10/26/20 1919 10/27/20 0551  WBC 6.3 6.3  HGB 8.5* 8.0*  HCT 29.7* 27.8*  PLT 353 305   BMET Recent Labs    10/26/20 1919 10/27/20 0551  NA 135 137  K 3.3* 3.2*  CL 103 105  CO2 28 27  GLUCOSE 106* 100*  BUN 12 11  CREATININE 0.55 0.62  CALCIUM 10.1 9.6   LFT Recent Labs    10/26/20 1919  PROT 6.7  ALBUMIN 3.8  AST 18  ALT 14  ALKPHOS 104  BILITOT 1.1   PT/INR Recent Labs    10/26/20 1930  LABPROT 16.9*  INR 1.4*   Hepatitis Panel No results for input(s): HEPBSAG, HCVAB, HEPAIGM,  HEPBIGM in the last 72 hours.   . CBC Latest Ref Rng & Units 10/27/2020 10/26/2020 02/21/2018  WBC 4.0 - 10.5 K/uL 6.3 6.3 6.8  Hemoglobin 12.0 - 15.0 g/dL 8.0(L) 8.5(L) 13.7  Hematocrit 36.0 - 46.0 % 27.8(L) 29.7(L) 39.6  Platelets 150 - 400 K/uL 305 353 257    . CMP Latest Ref Rng & Units 10/27/2020 10/26/2020 02/21/2018  Glucose 70 - 99 mg/dL 100(H) 106(H) 95  BUN 8 - 23 mg/dL 11 12 20   Creatinine 0.44 - 1.00 mg/dL 0.62 0.55 0.82  Sodium 135 - 145 mmol/L 137 135 139  Potassium 3.5 - 5.1 mmol/L 3.2(L) 3.3(L) 4.3  Chloride 98 - 111 mmol/L 105 103 99  CO2 22 - 32 mmol/L 27 28 27   Calcium 8.9 - 10.3 mg/dL 9.6 10.1 10.4(H)  Total Protein 6.5 - 8.1 g/dL - 6.7 -  Total Bilirubin 0.3 - 1.2 mg/dL - 1.1 -  Alkaline Phos 38 - 126 U/L - 104 -  AST 15 - 41 U/L - 18 -  ALT 0 - 44 U/L - 14 -   Studies/Results: DG Chest 2 View  Result Date: 10/26/2020 CLINICAL DATA:  Shortness of breath.  Fatigue. EXAM: CHEST - 2 VIEW COMPARISON:  Radiograph 08/19/2015.  CT 12/16/2017 FINDINGS: Upper normal heart size with normal mediastinal contours. No pulmonary edema, pleural effusion, or pneumothorax. No focal airspace disease. Mild biapical pleuroparenchymal scarring. Pulmonary nodules on prior CT are not well seen by radiograph. Chronic lower thoracic compression fracture, unchanged. IMPRESSION: No acute chest findings. Electronically Signed   By: Keith Rake M.D.   On: 10/26/2020 19:43    Principal Problem:   Anemia Active Problems:   Paroxysmal atrial fibrillation (HCC)   Chronic anticoagulation   GI bleed    Tye Savoy, NP-C @  10/27/2020, 10:11 AM

## 2020-10-27 NOTE — ED Notes (Signed)
Messaged hospitalist regarding tx plan for pt, scope versus admission versus abservation. Hospitalist to review. No additional orders at this time.

## 2020-10-27 NOTE — H&P (Addendum)
History and Physical    Robin [name] EUM:[id number] DOB: 1934-11-23 DOA: 10/26/2020  PCP: [name], PA-C  Patient coming from: Home  I have personally briefly reviewed patient's old medical records in [place]  Chief Complaint: Anemia  HPI: Robin [name] is a 86 y.o. female with medical history significant of A.Fib on eliquis, HTN.  Pt presents to ED for evaluation of anemia and SOB.  SOB ongoing since Jan, primarily pt has DOE.  HGB was 12 in Jan when seen by PCP.  Symptoms slightly worsened recently.  Taking all meds as directed.  No leg swelling.  No missed doses of eliquis.  No visible blood in stools.  No prior h/o GIB.  No syncope, no CP, no abd pain.   ED Course: HGB 8.5, hemoccult positive.  EDP sent message to [facility] and hospitalist asked to admit.   Review of Systems: As per HPI, otherwise all review of systems negative.  Past Medical History:  Diagnosis Date  . Anxiety   . AV bloc first degree   . Carotid stenosis 03/02/2017  . Hyperlipidemia   . Hypertension   . Lumbar pain    fell off of her horse and has low back pain at times  . Venous insufficiency of both lower extremities   . Vertigo     Past Surgical History:  Procedure Laterality Date  . ABDOMINAL HYSTERECTOMY    . BREAST BIOPSY Left   . BREAST EXCISIONAL BIOPSY Right   . BREAST LUMPECTOMY WITH RADIOACTIVE SEED LOCALIZATION Left 05/05/2015   Procedure: LEFT BREAST LUMPECTOMY WITH RADIOACTIVE SEED LOCALIZATION;  Surgeon: Autumn Messing III, MD;  Location: [place];  Service: General;  Laterality: Left;  . CHOLECYSTECTOMY    . EYE SURGERY     bil cataract surgery  . LUNG SURGERY       reports that she has never smoked. She has never used smokeless tobacco. She reports current alcohol use. She reports that she does not use drugs.  Allergies  Allergen Reactions  . Iodinated Diagnostic Agents Shortness Of Breath  . Amlodipine     Shortness of breath   . Codeine Nausea Only  . Diphenhydramine Other (See Comments)    hypertension  . Diphenhydramine Hcl     Hypertension   . Pravastatin     Muscle aches  . Zetia [Ezetimibe]   . Sulfa Antibiotics Rash  . Sulfa Drugs Cross Reactors Rash    Family History  Problem Relation Age of Onset  . Hypertension Mother   . Stroke Mother   . Heart disease Mother   . Heart disease Father   . Hypertension Sister   . Cancer Sister   . Hypertension Brother   . Hypertension Sister   . Hypertension Sister   . Breast cancer Neg Hx      Prior to Admission medications   Medication Sig Start Date End Date Taking? Authorizing Provider  atorvastatin (LIPITOR) 10 MG tablet Take 0.5 tablets by mouth 3 (three) times a week. 09/02/20  Yes [provider]  Cholecalciferol (VITAMIN D) 50 MCG (2000 UT) tablet Take 2,000 Units by mouth at bedtime.   Yes [provider]  diphenoxylate-atropine (LOMOTIL) 2.5-0.025 MG per tablet Take 1 tablet by mouth daily as needed (for diarrhea).  08/27/14  Yes [provider]  ELIQUIS 5 MG TABS tablet TAKE 1 TABLET BY MOUTH TWICE DAILY. 10/06/20  Yes Skeet Latch, MD  estradiol (ESTRACE) 1 MG tablet Take 0.5  mg by mouth daily. 08/24/20  Yes [provider]  Lactobacillus Rhamnosus, GG, (CULTURELLE PO) Take 1 capsule by mouth every morning.   Yes [provider]  Lutein 20 MG CAPS Take 1 capsule by mouth every evening.   Yes [provider]  metoprolol succinate (TOPROL-XL) 50 MG 24 hr tablet Take 50 mg by mouth 2 (two) times daily. 10/03/20  Yes [provider]  potassium chloride SA (KLOR-CON) 20 MEQ tablet TAKE 1 TABLET BY MOUTH TWICE DAILY. Patient taking differently: Take 20 mEq by mouth daily. 04/22/20  Yes Skeet Latch, MD  TIADYLT ER 180 MG 24 hr capsule TAKE (1) CAPSULE DAILY. Patient taking differently: Take 180 mg by mouth daily. 09/07/20  Yes Skeet Latch, MD  tiZANidine (ZANAFLEX) 2 MG tablet  Take 2 mg by mouth every 8 (eight) hours as needed for muscle spasms. 10/03/20  Yes [provider]  triamcinolone cream (KENALOG) 0.1 % Apply 1 application topically daily as needed (for rash).  09/13/14  Yes [provider]  triamterene-hydrochlorothiazide (MAXZIDE-25) 37.5-25 MG tablet Take 1 tablet by mouth daily. 08/17/20  Yes [provider]  vitamin A 10000 UNIT capsule Take 10,000 Units by mouth daily.   Yes [provider]    Physical Exam: Vitals:   10/26/20 2200 10/27/20 0036 10/27/20 0200 10/27/20 0300  BP: 113/63 (!) 115/59 (!) 108/55 123/60  Pulse: (!) 59  (!) 54 (!) 55  Resp: 11   14  Temp:      TempSrc:      SpO2: 100%  93% 100%  Weight:      Height:        Constitutional: NAD, calm, comfortable Eyes: PERRL, lids and conjunctivae normal ENMT: Mucous membranes are moist. Posterior pharynx clear of any exudate or lesions.Normal dentition.  Neck: normal, supple, no masses, no thyromegaly Respiratory: clear to auscultation bilaterally, no wheezing, no crackles. Normal respiratory effort. No accessory muscle use.  Cardiovascular: Regular rate and rhythm, no murmurs / rubs / gallops. No extremity edema. 2+ pedal pulses. No carotid bruits.  Abdomen: no tenderness, no masses palpated. No hepatosplenomegaly. Bowel sounds positive.  Musculoskeletal: no clubbing / cyanosis. No joint deformity upper and lower extremities. Good ROM, no contractures. Normal muscle tone.  Skin: no rashes, lesions, ulcers. No induration Neurologic: CN 2-12 grossly intact. Sensation intact, DTR normal. Strength 5/5 in all 4.  Psychiatric: Normal judgment and insight. Alert and oriented x 3. Normal mood.    Labs on Admission: I have personally reviewed following labs and imaging studies  CBC: Recent Labs  Lab 10/26/20 1919  WBC 6.3  NEUTROABS 3.9  HGB 8.5*  HCT 29.7*  MCV 75.0*  PLT 169   Basic Metabolic Panel: Recent Labs  Lab 10/26/20 1919  NA 135  K  3.3*  CL 103  CO2 28  GLUCOSE 106*  BUN 12  CREATININE 0.55  CALCIUM 10.1   GFR: Estimated Creatinine Clearance: 49.8 mL/min (by C-G formula based on SCr of 0.55 mg/dL). Liver Function Tests: Recent Labs  Lab 10/26/20 1919  AST 18  ALT 14  ALKPHOS 104  BILITOT 1.1  PROT 6.7  ALBUMIN 3.8   Recent Labs  Lab 10/26/20 1919  LIPASE 31   No results for input(s): AMMONIA in the last 168 hours. Coagulation Profile: Recent Labs  Lab 10/26/20 1930  INR 1.4*   Cardiac Enzymes: No results for input(s): CKTOTAL, CKMB, CKMBINDEX, TROPONINI in the last 168 hours. BNP (last 3 results) No results for  input(s): PROBNP in the last 8760 hours. HbA1C: No results for input(s): HGBA1C in the last 72 hours. CBG: No results for input(s): GLUCAP in the last 168 hours. Lipid Profile: No results for input(s): CHOL, HDL, LDLCALC, TRIG, CHOLHDL, LDLDIRECT in the last 72 hours. Thyroid Function Tests: No results for input(s): TSH, T4TOTAL, FREET4, T3FREE, THYROIDAB in the last 72 hours. Anemia Panel: Recent Labs    10/26/20 1919 10/26/20 1930  VITAMINB12  --  277  FOLATE  --  15.7  FERRITIN  --  5*  TIBC  --  525*  IRON  --  22*  RETICCTPCT 1.6  --    Urine analysis: No results found for: COLORURINE, APPEARANCEUR, LABSPEC, PHURINE, GLUCOSEU, HGBUR, BILIRUBINUR, KETONESUR, PROTEINUR, UROBILINOGEN, NITRITE, LEUKOCYTESUR  Radiological Exams on Admission: DG Chest 2 View  Result Date: 10/26/2020 CLINICAL DATA:  Shortness of breath.  Fatigue. EXAM: CHEST - 2 VIEW COMPARISON:  Radiograph 08/19/2015.  CT 12/16/2017 FINDINGS: Upper normal heart size with normal mediastinal contours. No pulmonary edema, pleural effusion, or pneumothorax. No focal airspace disease. Mild biapical pleuroparenchymal scarring. Pulmonary nodules on prior CT are not well seen by radiograph. Chronic lower thoracic compression fracture, unchanged. IMPRESSION: No acute chest findings. Electronically Signed   By: Keith Rake M.D.   On: 10/26/2020 19:43    EKG: Independently reviewed.  A.Fib, rate controlled at 71  Assessment/Plan Principal Problem:   Anemia Active Problems:   Paroxysmal atrial fibrillation (HCC)   Chronic anticoagulation   GI bleed    1. Iron def anemia due to GIB - 1. Hold eliquis 2. Repeat CBC in AM 3. Clear liquid diet 4. GI consult in AM 5. Start PO iron at 1 tab BID. 6. Tele monitor 2. PAF - 1. Hold eliquis 2. Cont BB and CCB  DVT prophylaxis: SCDs Code Status: Full Family Communication: No family in room Disposition Plan: Home after cleared by GI Consults called: Message sent by EDP to Dr. Cristina Gong Admission status: Place in obs   Twylah Bennetts, [place] [facility]  How to contact the [facility] Attending or Consulting provider [place] or covering provider during after hours [place], for this patient?  1. Check the care team in [facility] and look for a) attending/consulting [facility] provider listed and b) the [facility] team listed 2. Log into [email or web address]  Amion Physician Scheduling and messaging for groups and whole hospitals  On call and physician scheduling software for group practices, residents, hospitalists and other medical providers for call, clinic, rotation and shift schedules. On[employer] is a hospital-wide system for scheduling doctors and paging doctors on call. EasyPlot is for scientific plotting and data analysis.  [email or web address]  and use [place] universal password to access. If you do not have the password, please contact the hospital operator.  3. Locate the [facility] provider you are looking for under [facility] and page to a number that you can be directly reached. 4. If you still have difficulty reaching the provider, please page the [facility] (Director on Call) for the Hospitalists listed on amion for assistance.  10/27/2020, 3:46 AM

## 2020-10-27 NOTE — ED Notes (Signed)
ED TO INPATIENT HANDOFF REPORT  Name/Age/Gender Robin [name] 86 y.o. female  Code Status    Code Status Orders  (From admission, onward)         Start     Ordered   10/26/20 2338  Full code  Continuous        10/26/20 2339        Code Status History    This patient has a current code status but no historical code status.   Advance Care Planning Activity      Home/SNF/Other Home  Chief Complaint Anemia [D64.9]  Level of Care/Admitting Diagnosis ED Disposition    ED Disposition Condition Comment   Admit  Hospital Area: [facility] [[id number]]  Level of Care: Telemetry [5]  Admit to tele based on following criteria: Other see comments  Comments: Anemia  Covid Evaluation: Asymptomatic Screening Protocol (No Symptoms)  Diagnosis: Anemia [366440]  Admitting Physician: Doreatha Massed  Attending Physician: Etta Quill [phone]       Medical History Past Medical History:  Diagnosis Date  . Anxiety   . AV bloc first degree   . Carotid stenosis 03/02/2017  . Hyperlipidemia   . Hypertension   . Lumbar pain    fell off of her horse and has low back pain at times  . Venous insufficiency of both lower extremities   . Vertigo     Allergies Allergies  Allergen Reactions  . Iodinated Diagnostic Agents Shortness Of Breath  . Amlodipine     Shortness of breath  . Codeine Nausea Only  . Diphenhydramine Other (See Comments)    hypertension  . Diphenhydramine Hcl     Hypertension   . Pravastatin     Muscle aches  . Zetia [Ezetimibe]   . Sulfa Antibiotics Rash  . Sulfa Drugs Cross Reactors Rash    IV Location/Drains/Wounds Patient Lines/Drains/Airways Status    Active Line/Drains/Airways    Name Placement date Placement time Site Days   Peripheral IV 10/26/20 Right Antecubital 10/26/20  1924  Antecubital  1   Incision (Closed) 05/05/15 Breast Left 05/05/15  1216  -- 2002          Labs/Imaging Results for orders  placed or performed during the hospital encounter of 10/26/20 (from the past 48 hour(s))  Comprehensive metabolic panel     Status: Abnormal   Collection Time: 10/26/20  7:19 PM  Result Value Ref Range   Sodium 135 135 - 145 mmol/L   Potassium 3.3 (L) 3.5 - 5.1 mmol/L   Chloride 103 98 - 111 mmol/L   CO2 28 22 - 32 mmol/L   Glucose, Bld 106 (H) 70 - 99 mg/dL    Comment: Glucose reference range applies only to samples taken after fasting for at least 8 hours.   BUN 12 8 - 23 mg/dL   Creatinine, Ser 0.55 0.44 - 1.00 mg/dL   Calcium 10.1 8.9 - 10.3 mg/dL   Total Protein 6.7 6.5 - 8.1 g/dL   Albumin 3.8 3.5 - 5.0 g/dL   AST 18 15 - 41 U/L   ALT 14 0 - 44 U/L   Alkaline Phosphatase 104 38 - 126 U/L   Total Bilirubin 1.1 0.3 - 1.2 mg/dL   GFR, Estimated >60 >60 mL/min    Comment: (NOTE) Calculated using the CKD-EPI Creatinine Equation (2021)    Anion gap 4 (L) 5 - 15    Comment: Performed at [facility], [place]  [address] [place], [place] [address]  CBC with Differential     Status: Abnormal   Collection Time: 10/26/20  7:19 PM  Result Value Ref Range   WBC 6.3 4.0 - 10.5 K/uL   RBC 3.96 3.87 - 5.11 MIL/uL   Hemoglobin 8.5 (L) 12.0 - 15.0 g/dL    Comment: Reticulocyte Hemoglobin testing may be clinically indicated, consider ordering this additional test [id number]    HCT 29.7 (L) 36.0 - 46.0 %   MCV 75.0 (L) 80.0 - 100.0 fL   MCH 21.5 (L) 26.0 - 34.0 pg   MCHC 28.6 (L) 30.0 - 36.0 g/dL   RDW 15.4 11.5 - 15.5 %   Platelets 353 150 - 400 K/uL   nRBC 0.0 0.0 - 0.2 %   Neutrophils Relative % 63 %   Neutro Abs 3.9 1.7 - 7.7 K/uL   Lymphocytes Relative 24 %   Lymphs Abs 1.5 0.7 - 4.0 K/uL   Monocytes Relative 10 %   Monocytes Absolute 0.7 0.1 - 1.0 K/uL   Eosinophils Relative 2 %   Eosinophils Absolute 0.2 0.0 - 0.5 K/uL   Basophils Relative 1 %   Basophils Absolute 0.1 0.0 - 0.1 K/uL   Immature Granulocytes 0 %   Abs Immature Granulocytes 0.01 0.00 - 0.07  K/uL    Comment: Performed at [facility], [place] [address], [place], [place] [address]  Lipase, blood     Status: None   Collection Time: 10/26/20  7:19 PM  Result Value Ref Range   Lipase 31 11 - 51 U/L    Comment: Performed at [facility], [place] [address], [place], [place] [address]  Troponin I (High Sensitivity)     Status: None   Collection Time: 10/26/20  7:19 PM  Result Value Ref Range   Troponin I (High Sensitivity) 7 <18 ng/L    Comment: (NOTE) Elevated high sensitivity troponin I (hsTnI) values and significant  changes across serial measurements may suggest ACS but many other  chronic and acute conditions are known to elevate hsTnI results.  Refer to the "Links" section for chest pain algorithms and additional  guidance. Performed at [facility], [place] [address] [place], [place] [address]   Reticulocytes     Status: Abnormal   Collection Time: 10/26/20  7:19 PM  Result Value Ref Range   Retic Ct Pct 1.6 0.4 - 3.1 %   RBC. 3.98 3.87 - 5.11 MIL/uL   Retic Count, Absolute 61.7 19.0 - 186.0 K/uL   Immature Retic Fract 21.3 (H) 2.3 - 15.9 %    Comment: Performed at [facility], [place] [address], [place], [place] [address]  POC occult blood, ED RN will collect     Status: Abnormal   Collection Time: 10/26/20  7:27 PM  Result Value Ref Range   Fecal Occult Bld POSITIVE (A) NEGATIVE  Protime-INR     Status: Abnormal   Collection Time: 10/26/20  7:30 PM  Result Value Ref Range   Prothrombin Time 16.9 (H) 11.4 - 15.2 seconds   INR 1.4 (H) 0.8 - 1.2    Comment: (NOTE) INR goal varies based on device and disease states. Performed at [facility], [place] [address] [place], [place] [address]   Vitamin B12     Status: None   Collection Time: 10/26/20  7:30 PM  Result Value Ref Range   Vitamin B-12 277 180 - 914 pg/mL    Comment: (NOTE) This  assay is not validated for testing neonatal  or myeloproliferative syndrome specimens for Vitamin B12 levels. Performed at [facility], [place] [address] [place], [place] [address]   Folate     Status: None   Collection Time: 10/26/20  7:30 PM  Result Value Ref Range   Folate 15.7 >5.9 ng/mL    Comment: Performed at [facility], [place] [address] [place], [place] [address]  Iron and TIBC     Status: Abnormal   Collection Time: 10/26/20  7:30 PM  Result Value Ref Range   Iron 22 (L) 28 - 170 ug/dL   TIBC 525 (H) 250 - 450 ug/dL   Saturation Ratios 4 (L) 10.4 - 31.8 %   UIBC 503 ug/dL    Comment: Performed at [facility], [place] [address] [place], [place] [address]  Ferritin     Status: Abnormal   Collection Time: 10/26/20  7:30 PM  Result Value Ref Range   Ferritin 5 (L) 11 - 307 ng/mL    Comment: Performed at [facility], [place] [address], [place], [place] [address]  Type and screen [place]     Status: None   Collection Time: 10/26/20  9:27 PM  Result Value Ref Range   ABO/RH(D) O POS    Antibody Screen NEG    Sample Expiration      10/29/2020,2359 Performed at [place] [address], [place], [place] [address]   Troponin I (High Sensitivity)     Status: None   Collection Time: 10/26/20  9:30 PM  Result Value Ref Range   Troponin I (High Sensitivity) 6 <18 ng/L    Comment: (NOTE) Elevated high sensitivity troponin I (hsTnI) values and significant  changes across serial measurements may suggest ACS but many other  chronic and acute conditions are known to elevate hsTnI results.  Refer to the "Links" section for chest pain algorithms and additional  guidance. Performed at [facility], [place] [address] [place], [place] [address]   ABO/Rh     Status: None   Collection Time: 10/26/20  9:37 PM  Result Value Ref Range   ABO/RH(D)      O POS Performed at [facility], [place] [address], [place], [place] [address]   Resp Panel by RT-PCR (Flu A&B, Covid) Nasopharyngeal Swab     Status: None   Collection Time: 10/26/20 10:00 PM   Specimen: Nasopharyngeal Swab; Nasopharyngeal(NP) swabs in vial transport medium  Result Value Ref Range   SARS Coronavirus 2 by RT PCR NEGATIVE NEGATIVE    Comment: (NOTE) SARS-CoV-2 target nucleic acids are NOT DETECTED.  The SARS-CoV-2 RNA is generally detectable in upper respiratory specimens during the acute phase of infection. The lowest concentration of SARS-CoV-2 viral copies this assay can detect is 138 copies/mL. A negative result does not preclude SARS-Cov-2 infection and should not be used as the sole basis for treatment or other patient management decisions. A negative result may occur with  improper specimen collection/handling, submission of specimen other than nasopharyngeal swab, presence of viral mutation(s) within the areas targeted by this assay, and inadequate number of viral copies(<138 copies/mL). A negative result must be combined with clinical observations, patient history, and epidemiological information. The expected result is Negative.  Fact Sheet for Patients:  EntrepreneurPulse.com.au  Fact Sheet for Healthcare Providers:  IncredibleEmployment.be  This test is no t yet approved or cleared by the [place] FDA  and  has been authorized for detection and/or diagnosis of SARS-CoV-2 by FDA under an Emergency Use Authorization (EUA). This EUA will remain  in effect (meaning this test can be used) for the duration of the COVID-19 declaration under Section 564(b)(1) of the Act, 21 U.S.C.section 360bbb-3(b)(1), unless the authorization is terminated  or revoked sooner.       Influenza A by PCR NEGATIVE NEGATIVE   Influenza B by PCR NEGATIVE NEGATIVE    Comment: (NOTE) The Xpert Xpress SARS-CoV-2/FLU/RSV plus assay is intended as an aid in the  diagnosis of influenza from Nasopharyngeal swab specimens and should not be used as a sole basis for treatment. Nasal washings and aspirates are unacceptable for Xpert Xpress SARS-CoV-2/FLU/RSV testing.  Fact Sheet for Patients: EntrepreneurPulse.com.au  Fact Sheet for Healthcare Providers: IncredibleEmployment.be  This test is not yet approved or cleared by the [place] FDA and has been authorized for detection and/or diagnosis of SARS-CoV-2 by FDA under an Emergency Use Authorization (EUA). This EUA will remain in effect (meaning this test can be used) for the duration of the COVID-19 declaration under Section 564(b)(1) of the Act, 21 U.S.C. section 360bbb-3(b)(1), unless the authorization is terminated or revoked.  Performed at [facility], [place] [address] [place] [address], [place], [place] [address]    DG Chest 2 View  Result Date: 10/26/2020 CLINICAL DATA:  Shortness of breath.  Fatigue. EXAM: CHEST - 2 VIEW COMPARISON:  Radiograph 08/19/2015.  CT 12/16/2017 FINDINGS: Upper normal heart size with normal mediastinal contours. No pulmonary edema, pleural effusion, or pneumothorax. No focal airspace disease. Mild biapical pleuroparenchymal scarring. Pulmonary nodules on prior CT are not well seen by radiograph. Chronic lower thoracic compression fracture, unchanged. IMPRESSION: No acute chest findings. Electronically Signed   By: Keith Rake M.D.   On: 10/26/2020 19:43    Pending Labs Unresulted Labs (From admission, onward)          Start     Ordered   10/27/20 0500  CBC  Tomorrow morning,   R        10/26/20 2339   10/27/20 [id number]  Basic metabolic panel  Tomorrow morning,   R        10/26/20 2339          Vitals/Pain Today's Vitals   10/26/20 2115 10/26/20 2158 10/26/20 2200 10/27/20 0036  BP: 132/65  113/63 (!) 115/59  Pulse: 72 (!) 57 (!) 59   Resp: (!) 21  11   Temp:      TempSrc:      SpO2: 100%  100%   Weight:       Height:      PainSc:        Isolation Precautions No active isolations  Medications Medications  acetaminophen (TYLENOL) tablet 650 mg (has no administration in time range)    Or  acetaminophen (TYLENOL) suppository 650 mg (has no administration in time range)  ondansetron (ZOFRAN) tablet 4 mg (has no administration in time range)    Or  ondansetron (ZOFRAN) injection 4 mg (has no administration in time range)  metoprolol succinate (TOPROL-XL) 24 hr tablet 50 mg (50 mg Oral Given 10/27/20 0036)  pantoprazole (PROTONIX) injection 40 mg (40 mg Intravenous Given 10/26/20 2123)    Mobility walks

## 2020-10-28 ENCOUNTER — Inpatient Hospital Stay (HOSPITAL_COMMUNITY): Payer: Medicare PPO

## 2020-10-28 ENCOUNTER — Inpatient Hospital Stay (HOSPITAL_COMMUNITY): Payer: Medicare PPO | Admitting: Certified Registered"

## 2020-10-28 ENCOUNTER — Encounter (HOSPITAL_COMMUNITY): Payer: Self-pay | Admitting: Internal Medicine

## 2020-10-28 ENCOUNTER — Encounter (HOSPITAL_COMMUNITY)
Admission: EM | Disposition: A | Discharge status: Skilled Nursing Facility | Payer: Self-pay | Source: Home / Self Care | Attending: Family Medicine

## 2020-10-28 DIAGNOSIS — C182 Malignant neoplasm of ascending colon: Principal | ICD-10-CM

## 2020-10-28 DIAGNOSIS — K635 Polyp of colon: Secondary | ICD-10-CM | POA: Diagnosis not present

## 2020-10-28 DIAGNOSIS — D509 Iron deficiency anemia, unspecified: Secondary | ICD-10-CM | POA: Diagnosis not present

## 2020-10-28 DIAGNOSIS — K6389 Other specified diseases of intestine: Secondary | ICD-10-CM

## 2020-10-28 HISTORY — PX: SUBMUCOSAL TATTOO INJECTION: SHX6856

## 2020-10-28 HISTORY — PX: COLONOSCOPY WITH PROPOFOL: SHX5780

## 2020-10-28 HISTORY — PX: BIOPSY: SHX5522

## 2020-10-28 HISTORY — PX: POLYPECTOMY: SHX5525

## 2020-10-28 LAB — MAGNESIUM: Magnesium: 2 mg/dL (ref 1.7–2.4)

## 2020-10-28 LAB — CBC WITH DIFFERENTIAL/PLATELET
Abs Immature Granulocytes: 0.03 10*3/uL (ref 0.00–0.07)
Basophils Absolute: 0.1 10*3/uL (ref 0.0–0.1)
Basophils Relative: 1 %
Eosinophils Absolute: 0.1 10*3/uL (ref 0.0–0.5)
Eosinophils Relative: 2 %
HCT: 31.6 % — ABNORMAL LOW (ref 36.0–46.0)
Hemoglobin: 9 g/dL — ABNORMAL LOW (ref 12.0–15.0)
Immature Granulocytes: 0 %
Lymphocytes Relative: 19 %
Lymphs Abs: 1.3 10*3/uL (ref 0.7–4.0)
MCH: 21.5 pg — ABNORMAL LOW (ref 26.0–34.0)
MCHC: 28.5 g/dL — ABNORMAL LOW (ref 30.0–36.0)
MCV: 75.4 fL — ABNORMAL LOW (ref 80.0–100.0)
Monocytes Absolute: 0.7 10*3/uL (ref 0.1–1.0)
Monocytes Relative: 10 %
Neutro Abs: 4.8 10*3/uL (ref 1.7–7.7)
Neutrophils Relative %: 68 %
Platelets: 362 10*3/uL (ref 150–400)
RBC: 4.19 MIL/uL (ref 3.87–5.11)
RDW: 15.5 % (ref 11.5–15.5)
WBC: 7 10*3/uL (ref 4.0–10.5)
nRBC: 0 % (ref 0.0–0.2)

## 2020-10-28 LAB — BASIC METABOLIC PANEL
Anion gap: 7 (ref 5–15)
BUN: 11 mg/dL (ref 8–23)
CO2: 24 mmol/L (ref 22–32)
Calcium: 10.2 mg/dL (ref 8.9–10.3)
Chloride: 104 mmol/L (ref 98–111)
Creatinine, Ser: 0.62 mg/dL (ref 0.44–1.00)
GFR, Estimated: >60 mL/min (ref >60)
Glucose, Bld: 111 mg/dL — ABNORMAL HIGH (ref 70–99)
Potassium: 3.2 mmol/L — ABNORMAL LOW (ref 3.5–5.1)
Sodium: 135 mmol/L (ref 135–145)

## 2020-10-28 LAB — PHOSPHORUS: Phosphorus: 2.3 mg/dL — ABNORMAL LOW (ref 2.5–4.6)

## 2020-10-28 SURGERY — COLONOSCOPY WITH PROPOFOL
Anesthesia: Monitor Anesthesia Care

## 2020-10-28 MED ORDER — METOPROLOL TARTRATE 5 MG/5ML IV SOLN
2.5000 mg | INTRAVENOUS | Status: DC | PRN
  Administered 2020-11-02 – 2020-11-18 (×4): 2.5 mg via INTRAVENOUS
  Filled 2020-10-28 (×4): qty 5

## 2020-10-28 MED ORDER — SPOT INK MARKER SYRINGE KIT
PACK | SUBMUCOSAL | Status: AC
  Filled 2020-10-28: qty 5

## 2020-10-28 MED ORDER — LACTATED RINGERS IV SOLN
INTRAVENOUS | Status: AC | PRN
  Administered 2020-10-28: 1000 mL via INTRAVENOUS

## 2020-10-28 MED ORDER — SPOT INK MARKER SYRINGE KIT
PACK | SUBMUCOSAL | Status: DC | PRN
  Administered 2020-10-28: 3 mL via SUBMUCOSAL

## 2020-10-28 MED ORDER — PROPOFOL 10 MG/ML IV BOLUS
INTRAVENOUS | Status: AC
  Filled 2020-10-28: qty 20

## 2020-10-28 MED ORDER — PROPOFOL 500 MG/50ML IV EMUL
INTRAVENOUS | Status: DC | PRN
  Administered 2020-10-28: 85 ug/kg/min via INTRAVENOUS

## 2020-10-28 MED ORDER — PROPOFOL 10 MG/ML IV BOLUS
INTRAVENOUS | Status: DC | PRN
  Administered 2020-10-28 (×2): 10 mg via INTRAVENOUS

## 2020-10-28 MED ORDER — PROPOFOL 1000 MG/100ML IV EMUL
INTRAVENOUS | Status: AC
  Filled 2020-10-28: qty 100

## 2020-10-28 MED ORDER — BOOST / RESOURCE BREEZE PO LIQD CUSTOM
1.0000 | Freq: Three times a day (TID) | ORAL | Status: DC
  Administered 2020-10-28 – 2020-10-30 (×6): 1 via ORAL

## 2020-10-28 MED ORDER — LORAZEPAM 0.5 MG PO TABS
0.2500 mg | ORAL_TABLET | Freq: Two times a day (BID) | ORAL | Status: DC | PRN
  Administered 2020-10-28 – 2020-10-29 (×2): 0.25 mg via ORAL
  Filled 2020-10-28 (×2): qty 1

## 2020-10-28 MED ORDER — K PHOS MONO-SOD PHOS DI & MONO 155-852-130 MG PO TABS
500.0000 mg | ORAL_TABLET | Freq: Once | ORAL | Status: AC
  Administered 2020-10-28: 500 mg via ORAL
  Filled 2020-10-28: qty 2

## 2020-10-28 MED ORDER — ALPRAZOLAM 0.25 MG PO TABS
0.2500 mg | ORAL_TABLET | Freq: Two times a day (BID) | ORAL | Status: DC | PRN
  Filled 2020-10-28: qty 1

## 2020-10-28 SURGICAL SUPPLY — 25 items

## 2020-10-28 NOTE — Plan of Care (Signed)
Pt tolerates moviprep ok; AAOx4; ambulatory; no s/s of acute distress or pain reported or observed; pt ambulates from bed to bathroom getting ready for EGD/Colonoscopy today.

## 2020-10-28 NOTE — Progress Notes (Signed)
Patient in Endoscopy, found to have a non-obstructing ascending colon mass. CCS consulted [name])

## 2020-10-28 NOTE — Transfer of Care (Signed)
Immediate Anesthesia Transfer of Care Note  Patient: Robin [name]  Procedure(s) Performed: ESOPHAGOGASTRODUODENOSCOPY (EGD) WITH PROPOFOL (N/A ) COLONOSCOPY WITH PROPOFOL (N/A ) BIOPSY POLYPECTOMY SUBMUCOSAL TATTOO INJECTION  Patient Location: PACU  Anesthesia Type:MAC  Level of Consciousness: awake  Airway & Oxygen Therapy: Patient Spontanous Breathing and Patient connected to face mask oxygen  Post-op Assessment: Report given to RN, Post -op Vital signs reviewed and stable and Patient moving all extremities X 4  Post vital signs: Reviewed and stable  Last Vitals:  Vitals Value Taken Time  BP    Temp    Pulse    Resp    SpO2      Last Pain:  Vitals:   10/28/20 1221  TempSrc: Temporal  PainSc: 0-No pain         Complications: No complications documented.

## 2020-10-28 NOTE — Anesthesia Procedure Notes (Signed)
Procedure Name: MAC Date/Time: 10/28/2020 12:50 PM Performed by: Niel Hummer, CRNA Pre-anesthesia Checklist: Patient identified, Emergency Drugs available, Suction available and Patient being monitored Oxygen Delivery Method: Simple face mask

## 2020-10-28 NOTE — Interval H&P Note (Signed)
History and Physical Interval Note:  10/28/2020 12:34 PM  Robin [name]  has presented today for surgery, with the diagnosis of iron deficiency anemia, hemoccult positive stool.  The various methods of treatment have been discussed with the patient and family. After consideration of risks, benefits and other options for treatment, the patient has consented to  Procedure(s): ESOPHAGOGASTRODUODENOSCOPY (EGD) WITH PROPOFOL (N/A) COLONOSCOPY WITH PROPOFOL (N/A) as a surgical intervention.  The patient's history has been reviewed, patient examined, no change in status, stable for surgery.  I have reviewed the patient's chart and labs.  Questions were answered to the patient's satisfaction.     Milus Banister

## 2020-10-28 NOTE — Op Note (Signed)
[facility] Patient Name: Robin [name] Procedure Date: 10/28/2020 MRN: [id number] Attending MD: Milus Banister , MD Date of Birth: 01-01-1935 CSN: [id number] Age: 86 Admit Type: Inpatient Procedure:                Colonoscopy Indications:              Heme positive stool, new anemia Providers:                Milus Banister, MD, Clyde Lundborg, RN, Tyrone Apple, Technician Referring MD:              Medicines:                Monitored Anesthesia Care Complications:            No immediate complications. Estimated blood loss:                            None. Estimated Blood Loss:     Estimated blood loss: none. Procedure:                Pre-Anesthesia Assessment:                           - Prior to the procedure, a History and Physical                            was performed, and patient medications and                            allergies were reviewed. The patient's tolerance of                            previous anesthesia was also reviewed. The risks                            and benefits of the procedure and the sedation                            options and risks were discussed with the patient.                            All questions were answered, and informed consent                            was obtained. Prior Anticoagulants: The patient has                            taken Eliquis (apixaban), last dose was 2 days                            prior to procedure. ASA Grade Assessment: III - A  patient with severe systemic disease. After                            reviewing the risks and benefits, the patient was                            deemed in satisfactory condition to undergo the                            procedure.                           After obtaining informed consent, the colonoscope                            was passed under direct vision. Throughout the                             procedure, the patient's blood pressure, pulse, and                            oxygen saturations were monitored continuously. The                            [id number]-[id number] ([id number]) Olympus colonoscope was                            introduced through the anus and advanced to the the                            cecum, identified by appendiceal orifice and                            ileocecal valve. The colonoscopy was performed                            without difficulty. The patient tolerated the                            procedure well. The quality of the bowel                            preparation was good. The ileocecal valve,                            appendiceal orifice, and rectum were photographed. Scope In: 12:58:08 PM Scope Out: 1:35:20 PM Scope Withdrawal Time: 0 hours 32 minutes 38 seconds  Total Procedure Duration: 0 hours 37 minutes 12 seconds  Findings:      An obviously malignant, non-circumferential, 3-4cm mass was found       located adjacent to the IC valve. Biopsies taken. jar 1      Two sessile polyps were found in the transverse colon. The polyps were       2-5 mm in size. These polyps were removed with a cold snare. Resection  and retrieval were complete. jar 2      A 25 mm heaped up but soft polyp was found in the (presumed) splenic       flexure. This was removed in piecemeal fashion with snare/cautery and       then the site was labled with two submucosal injections of Spot. jar 3.      Three sessile polyps were removed from the descending colon, ranged in       size from 7mm to 1.2cm. The largest required snare/cautery. The other       two polyps were removed with cold snare. jar 4 (there may have been       contamination from the splenic flexure polyp pieces into jar 4).      Internal hemorrhoids. 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                            Biopsied.                           - Several  polyps were also removed (mixed snare                            cautery and cold snare). The largest was located at                            the presumed splenic flexure, removed piecemeal,                            and although it was soft and not overtly malignant                            I labled the site with submucosal Spot injections                            given it's size (2.5cm). Moderate Sedation:      Not Applicable - Patient had care per Anesthesia. Recommendation:           - Return patient to hospital ward for ongoing care.                           - Will need staging tests with CT scan chest, abd,                            pelvis. CEA.                           - I recommend continuing to hold eliquis for now.                            Await pathology results.                           - General surgery has already been consulted.                           -  [facility] will return to see her on Monday. Drs.                            Collene Mares and Benson Norway are covering this weekend if needed. Procedure Code(s):        --- Professional ---                           [phone], Colonoscopy, flexible; with removal of                            tumor(s), polyp(s), or other lesion(s) by snare                            technique                           [id number], Colonoscopy, flexible; with directed                            submucosal injection(s), any substance Diagnosis Code(s):        --- Professional ---                           C18.2, Malignant neoplasm of ascending colon                           K63.5, Polyp of colon                           R19.5, Other fecal abnormalities CPT copyright 2019 American Medical Association. All rights reserved. The codes documented in this report are preliminary and upon coder review may  be revised to meet current compliance requirements. Milus Banister, MD 10/28/2020 1:54:48 PM This report has been signed electronically. Number of  Addenda: 0

## 2020-10-28 NOTE — Consult Note (Addendum)
Consult Note  Robin [name] December 15, 1934  [id number].    Requesting MD: Tye Savoy NP Chief Complaint/Reason for Consult: Ascending colon mass HPI:  Patient is an 86 year old female who presented to [facility] for evaluation of anemia and SOB. Labs at her PCP's office showed a hgb of 8.0 and she reports that hgb had been 12.0 in Jan of this year. She noticed increased fatigue and SOB starting 2 weeks ago. No overt bleeding. No family hx of colon cancer. Patient's last colonoscopy was in 2007 for screening and showed some descending colitis. She does report some chronic loose stools. Denies dark or tarry stools, or change in stool character. She denies abdominal pain, nausea, vomiting, distention. She has not noted any significant weight loss unintentionally in the last few months.  PMH otherwise significant for atrial fibrillation on eliquis (LD 5/11 AM), HTN, HLD. Past abdominal surgery includes open cholecystectomy, salpingooophrectomy and abdominal hysterectomy. Denies tobacco or illicit drug use, drinks socially. She did smoke cigarettes in her 73s but reports she did not smoke heavily. Patient lives at home alone but her daughter lives close to her and she has several neighbors. Her husband passed away in 04/28/2020.   ROS: Review of Systems  Constitutional: Positive for malaise/fatigue. Negative for chills, diaphoresis, fever and weight loss.  Respiratory: Positive for shortness of breath. Negative for wheezing.   Cardiovascular: Positive for palpitations. Negative for chest pain.  Gastrointestinal: Positive for diarrhea. Negative for abdominal pain, blood in stool, constipation, melena, nausea and vomiting.  Genitourinary: Negative for dysuria, frequency and urgency.  All other systems reviewed and are negative.   Family History  Problem Relation Age of Onset  . Hypertension Mother   . Stroke Mother   . Heart disease Mother   . Heart disease Father   . Hypertension  Sister   . Cancer Sister   . Hypertension Brother   . Hypertension Sister   . Hypertension Sister   . Breast cancer Neg Hx     Past Medical History:  Diagnosis Date  . Anxiety   . AV bloc first degree   . Carotid stenosis 03/02/2017  . Hyperlipidemia   . Hypertension   . Lumbar pain    fell off of her horse and has low back pain at times  . Venous insufficiency of both lower extremities   . Vertigo     Past Surgical History:  Procedure Laterality Date  . ABDOMINAL HYSTERECTOMY    . BREAST BIOPSY Left   . BREAST EXCISIONAL BIOPSY Right   . BREAST LUMPECTOMY WITH RADIOACTIVE SEED LOCALIZATION Left 05/05/2015   Procedure: LEFT BREAST LUMPECTOMY WITH RADIOACTIVE SEED LOCALIZATION;  Surgeon: Autumn Messing III, MD;  Location: [facility];  Service: General;  Laterality: Left;  . CHOLECYSTECTOMY    . EYE SURGERY     bil cataract surgery  . LUNG SURGERY      Social History:  reports that she has never smoked. She has never used smokeless tobacco. She reports current alcohol use. She reports that she does not use drugs.  Allergies:  Allergies  Allergen Reactions  . Iodinated Diagnostic Agents Shortness Of Breath  . Amlodipine     Shortness of breath  . Codeine Nausea Only  . Diphenhydramine Other (See Comments)    hypertension  . Diphenhydramine Hcl     Hypertension   . Pravastatin     Muscle aches  . Zetia [Ezetimibe]   . Sulfa Antibiotics  Rash  . Sulfa Drugs Cross Reactors Rash    Medications Prior to Admission  Medication Sig Dispense Refill  . atorvastatin (LIPITOR) 10 MG tablet Take 0.5 tablets by mouth 3 (three) times a week.    . Cholecalciferol (VITAMIN D) 50 MCG (2000 UT) tablet Take 2,000 Units by mouth at bedtime.    . diphenoxylate-atropine (LOMOTIL) 2.5-0.025 MG per tablet Take 1 tablet by mouth daily as needed (for diarrhea).     Arne Cleveland 5 MG TABS tablet TAKE 1 TABLET BY MOUTH TWICE DAILY. 180 tablet 1  . estradiol (ESTRACE) 1 MG tablet  Take 0.5 mg by mouth daily.    . Lactobacillus Rhamnosus, GG, (CULTURELLE PO) Take 1 capsule by mouth every morning.    . Lutein 20 MG CAPS Take 1 capsule by mouth every evening.    . metoprolol succinate (TOPROL-XL) 50 MG 24 hr tablet Take 50 mg by mouth 2 (two) times daily.    . potassium chloride SA (KLOR-CON) 20 MEQ tablet TAKE 1 TABLET BY MOUTH TWICE DAILY. (Patient taking differently: Take 20 mEq by mouth daily.) 180 tablet 2  . TIADYLT ER 180 MG 24 hr capsule TAKE (1) CAPSULE DAILY. (Patient taking differently: Take 180 mg by mouth daily.) 90 capsule 2  . tiZANidine (ZANAFLEX) 2 MG tablet Take 2 mg by mouth every 8 (eight) hours as needed for muscle spasms.    Marland Kitchen triamcinolone cream (KENALOG) 0.1 % Apply 1 application topically daily as needed (for rash).     . triamterene-hydrochlorothiazide (MAXZIDE-25) 37.5-25 MG tablet Take 1 tablet by mouth daily.    . vitamin A 10000 UNIT capsule Take 10,000 Units by mouth daily.      Blood pressure (!) 173/83, pulse 100, temperature 97.7 F (36.5 C), temperature source Temporal, resp. rate 19, height 5\' 4"  (1.626 m), weight 66 kg, SpO2 100 %. Physical Exam:  General: pleasant, WD, elderly female who is laying in bed in NAD HEENT: head is normocephalic, atraumatic.  Sclera are noninjected.  PERRL.  Ears and nose without any masses or lesions.  Mouth is pink and moist Heart: regular, rate, and rhythm.  Normal s1,s2. No obvious murmurs, gallops, or rubs noted.  Palpable radial and pedal pulses bilaterally Lungs: CTAB, no wheezes, rhonchi, or rales noted.  Respiratory effort nonlabored Abd: soft, NT, ND, +BS, no masses, hernias, or organomegaly MS: all 4 extremities are symmetrical with no cyanosis, clubbing, or edema. Skin: warm and dry with no masses, lesions, or rashes Neuro: Cranial nerves 2-12 grossly intact, sensation is normal throughout Psych: A&Ox3 with an appropriate affect.   Results for orders placed or performed during the hospital  encounter of 10/26/20 (from the past 48 hour(s))  Comprehensive metabolic panel     Status: Abnormal   Collection Time: 10/26/20  7:19 PM  Result Value Ref Range   Sodium 135 135 - 145 mmol/L   Potassium 3.3 (L) 3.5 - 5.1 mmol/L   Chloride 103 98 - 111 mmol/L   CO2 28 22 - 32 mmol/L   Glucose, Bld 106 (H) 70 - 99 mg/dL    Comment: Glucose reference range applies only to samples taken after fasting for at least 8 hours.   BUN 12 8 - 23 mg/dL   Creatinine, Ser 0.55 0.44 - 1.00 mg/dL   Calcium 10.1 8.9 - 10.3 mg/dL   Total Protein 6.7 6.5 - 8.1 g/dL   Albumin 3.8 3.5 - 5.0 g/dL   AST 18 15 - 41 U/L  ALT 14 0 - 44 U/L   Alkaline Phosphatase 104 38 - 126 U/L   Total Bilirubin 1.1 0.3 - 1.2 mg/dL   GFR, Estimated >60 >60 mL/min    Comment: (NOTE) Calculated using the CKD-EPI Creatinine Equation (2021)    Anion gap 4 (L) 5 - 15    Comment: Performed at [facility], [place] [address], [place], [place] [address]  CBC with Differential     Status: Abnormal   Collection Time: 10/26/20  7:19 PM  Result Value Ref Range   WBC 6.3 4.0 - 10.5 K/uL   RBC 3.96 3.87 - 5.11 MIL/uL   Hemoglobin 8.5 (L) 12.0 - 15.0 g/dL    Comment: Reticulocyte Hemoglobin testing may be clinically indicated, consider ordering this additional test [id number]    HCT 29.7 (L) 36.0 - 46.0 %   MCV 75.0 (L) 80.0 - 100.0 fL   MCH 21.5 (L) 26.0 - 34.0 pg   MCHC 28.6 (L) 30.0 - 36.0 g/dL   RDW 15.4 11.5 - 15.5 %   Platelets 353 150 - 400 K/uL   nRBC 0.0 0.0 - 0.2 %   Neutrophils Relative % 63 %   Neutro Abs 3.9 1.7 - 7.7 K/uL   Lymphocytes Relative 24 %   Lymphs Abs 1.5 0.7 - 4.0 K/uL   Monocytes Relative 10 %   Monocytes Absolute 0.7 0.1 - 1.0 K/uL   Eosinophils Relative 2 %   Eosinophils Absolute 0.2 0.0 - 0.5 K/uL   Basophils Relative 1 %   Basophils Absolute 0.1 0.0 - 0.1 K/uL   Immature Granulocytes 0 %   Abs Immature Granulocytes 0.01 0.00 - 0.07 K/uL    Comment: Performed at [facility], [place] [address] [place], [place] [address]  Lipase, blood     Status: None   Collection Time: 10/26/20  7:19 PM  Result Value Ref Range   Lipase 31 11 - 51 U/L    Comment: Performed at [facility], [place] [address], [place], [place] [address]  Troponin I (High Sensitivity)     Status: None   Collection Time: 10/26/20  7:19 PM  Result Value Ref Range   Troponin I (High Sensitivity) 7 <18 ng/L    Comment: (NOTE) Elevated high sensitivity troponin I (hsTnI) values and significant  changes across serial measurements may suggest ACS but many other  chronic and acute conditions are known to elevate hsTnI results.  Refer to the "Links" section for chest pain algorithms and additional  guidance. Performed at [facility], [place] [address] [place], [place] [address]   Reticulocytes     Status: Abnormal   Collection Time: 10/26/20  7:19 PM  Result Value Ref Range   Retic Ct Pct 1.6 0.4 - 3.1 %   RBC. 3.98 3.87 - 5.11 MIL/uL   Retic Count, Absolute 61.7 19.0 - 186.0 K/uL   Immature Retic Fract 21.3 (H) 2.3 - 15.9 %    Comment: Performed at [facility], [place] [address] [place], [place] [address]  POC occult blood, ED RN will collect     Status: Abnormal   Collection Time: 10/26/20  7:27 PM  Result Value Ref Range   Fecal Occult Bld POSITIVE (A) NEGATIVE  Protime-INR     Status: Abnormal   Collection Time: 10/26/20  7:30 PM  Result Value Ref Range   Prothrombin Time 16.9 (H) 11.4 - 15.2 seconds   INR 1.4 (H) 0.8 -  1.2    Comment: (NOTE) INR goal varies based on device and disease states. Performed at [facility], [place] [address] [place], [place] [address]   Vitamin B12     Status: None   Collection Time: 10/26/20  7:30 PM  Result Value Ref Range   Vitamin B-12 277 180 - 914 pg/mL    Comment: (NOTE) This assay is not validated for testing neonatal or myeloproliferative syndrome specimens  for Vitamin B12 levels. Performed at [facility], [place] [address] [place], [place] [address]   Folate     Status: None   Collection Time: 10/26/20  7:30 PM  Result Value Ref Range   Folate 15.7 >5.9 ng/mL    Comment: Performed at [facility], [place] [address] [place], [place] [address]  Iron and TIBC     Status: Abnormal   Collection Time: 10/26/20  7:30 PM  Result Value Ref Range   Iron 22 (L) 28 - 170 ug/dL   TIBC 525 (H) 250 - 450 ug/dL   Saturation Ratios 4 (L) 10.4 - 31.8 %   UIBC 503 ug/dL    Comment: Performed at [facility], [place] [address], [place], [place] [address]  Ferritin     Status: Abnormal   Collection Time: 10/26/20  7:30 PM  Result Value Ref Range   Ferritin 5 (L) 11 - 307 ng/mL    Comment: Performed at [facility], [place] [address], [place], [place] [address]  Type and screen [place]     Status: None   Collection Time: 10/26/20  9:27 PM  Result Value Ref Range   ABO/RH(D) O POS    Antibody Screen NEG    Sample Expiration      10/29/2020,2359 Performed at [place] [address], [place], [place] [address]   Troponin I (High Sensitivity)     Status: None   Collection Time: 10/26/20  9:30 PM  Result Value Ref Range   Troponin I (High Sensitivity) 6 <18 ng/L    Comment: (NOTE) Elevated high sensitivity troponin I (hsTnI) values and significant  changes across serial measurements may suggest ACS but many other  chronic and acute conditions are known to elevate hsTnI results.  Refer to the "Links" section for chest pain algorithms and additional  guidance. Performed at [facility], [place] [address], [place], [place] [address]   ABO/Rh     Status: None   Collection Time: 10/26/20  9:37 PM  Result Value Ref Range   ABO/RH(D)      O POS Performed at [facility], [place] [address] [place], [place]  [address]   Resp Panel by RT-PCR (Flu A&B, Covid) Nasopharyngeal Swab     Status: None   Collection Time: 10/26/20 10:00 PM   Specimen: Nasopharyngeal Swab; Nasopharyngeal(NP) swabs in vial transport medium  Result Value Ref Range   SARS Coronavirus 2 by RT PCR NEGATIVE NEGATIVE    Comment: (NOTE) SARS-CoV-2 target nucleic acids are NOT DETECTED.  The SARS-CoV-2 RNA is generally detectable in upper respiratory specimens during the acute phase of infection. The lowest concentration of SARS-CoV-2 viral copies this assay can detect is 138 copies/mL. A negative result does not preclude SARS-Cov-2 infection and should not be used as the sole basis for treatment or other patient management decisions. A negative result may occur with  improper specimen collection/handling, submission of specimen other than nasopharyngeal swab, presence  of viral mutation(s) within the areas targeted by this assay, and inadequate number of viral copies(<138 copies/mL). A negative result must be combined with clinical observations, patient history, and epidemiological information. The expected result is Negative.  Fact Sheet for Patients:  EntrepreneurPulse.com.au  Fact Sheet for Healthcare Providers:  IncredibleEmployment.be  This test is no t yet approved or cleared by the [place] FDA and  has been authorized for detection and/or diagnosis of SARS-CoV-2 by FDA under an Emergency Use Authorization (EUA). This EUA will remain  in effect (meaning this test can be used) for the duration of the COVID-19 declaration under Section 564(b)(1) of the Act, 21 U.S.C.section 360bbb-3(b)(1), unless the authorization is terminated  or revoked sooner.       Influenza A by PCR NEGATIVE NEGATIVE   Influenza B by PCR NEGATIVE NEGATIVE    Comment: (NOTE) The Xpert Xpress SARS-CoV-2/FLU/RSV plus assay is intended as an aid in the diagnosis of influenza from Nasopharyngeal swab  specimens and should not be used as a sole basis for treatment. Nasal washings and aspirates are unacceptable for Xpert Xpress SARS-CoV-2/FLU/RSV testing.  Fact Sheet for Patients: EntrepreneurPulse.com.au  Fact Sheet for Healthcare Providers: IncredibleEmployment.be  This test is not yet approved or cleared by the [place] FDA and has been authorized for detection and/or diagnosis of SARS-CoV-2 by FDA under an Emergency Use Authorization (EUA). This EUA will remain in effect (meaning this test can be used) for the duration of the COVID-19 declaration under Section 564(b)(1) of the Act, 21 U.S.C. section 360bbb-3(b)(1), unless the authorization is terminated or revoked.  Performed at [facility], [place] [address] [place], [place] [address]   CBC     Status: Abnormal   Collection Time: 10/27/20  5:51 AM  Result Value Ref Range   WBC 6.3 4.0 - 10.5 K/uL   RBC 3.68 (L) 3.87 - 5.11 MIL/uL   Hemoglobin 8.0 (L) 12.0 - 15.0 g/dL    Comment: Reticulocyte Hemoglobin testing may be clinically indicated, consider ordering this additional test [id number]    HCT 27.8 (L) 36.0 - 46.0 %   MCV 75.5 (L) 80.0 - 100.0 fL   MCH 21.7 (L) 26.0 - 34.0 pg   MCHC 28.8 (L) 30.0 - 36.0 g/dL   RDW 15.5 11.5 - 15.5 %   Platelets 305 150 - 400 K/uL   nRBC 0.0 0.0 - 0.2 %    Comment: Performed at [facility], [place] [address], [place], [place] [id number]  Basic metabolic panel     Status: Abnormal   Collection Time: 10/27/20  5:51 AM  Result Value Ref Range   Sodium 137 135 - 145 mmol/L   Potassium 3.2 (L) 3.5 - 5.1 mmol/L   Chloride 105 98 - 111 mmol/L   CO2 27 22 - 32 mmol/L   Glucose, Bld 100 (H) 70 - 99 mg/dL    Comment: Glucose reference range applies only to samples taken after fasting for at least 8 hours.   BUN 11 8 - 23 mg/dL   Creatinine, Ser 0.62 0.44 - 1.00 mg/dL   Calcium 9.6 8.9 - 10.3 mg/dL   GFR, Estimated  >60 >60 mL/min    Comment: (NOTE) Calculated using the CKD-EPI Creatinine Equation (2021)    Anion gap 5 5 - 15    Comment: Performed at [facility], [place] [address], [place], [place] [address]  CBC with Differential/Platelet     Status: Abnormal   Collection Time: 10/28/20  7:20 AM  Result Value Ref Range   WBC 7.0 4.0 - 10.5 K/uL   RBC 4.19 3.87 - 5.11 MIL/uL   Hemoglobin 9.0 (L) 12.0 - 15.0 g/dL   HCT 31.6 (L) 36.0 - 46.0 %   MCV 75.4 (L) 80.0 - 100.0 fL   MCH 21.5 (L) 26.0 - 34.0 pg   MCHC 28.5 (L) 30.0 - 36.0 g/dL   RDW 15.5 11.5 - 15.5 %   Platelets 362 150 - 400 K/uL   nRBC 0.0 0.0 - 0.2 %   Neutrophils Relative % 68 %   Neutro Abs 4.8 1.7 - 7.7 K/uL   Lymphocytes Relative 19 %   Lymphs Abs 1.3 0.7 - 4.0 K/uL   Monocytes Relative 10 %   Monocytes Absolute 0.7 0.1 - 1.0 K/uL   Eosinophils Relative 2 %   Eosinophils Absolute 0.1 0.0 - 0.5 K/uL   Basophils Relative 1 %   Basophils Absolute 0.1 0.0 - 0.1 K/uL   Immature Granulocytes 0 %   Abs Immature Granulocytes 0.03 0.00 - 0.07 K/uL    Comment: Performed at [facility], [place] [address], [place], [place] [id number]  Basic metabolic panel     Status: Abnormal   Collection Time: 10/28/20  7:20 AM  Result Value Ref Range   Sodium 135 135 - 145 mmol/L   Potassium 3.2 (L) 3.5 - 5.1 mmol/L   Chloride 104 98 - 111 mmol/L   CO2 24 22 - 32 mmol/L   Glucose, Bld 111 (H) 70 - 99 mg/dL    Comment: Glucose reference range applies only to samples taken after fasting for at least 8 hours.   BUN 11 8 - 23 mg/dL   Creatinine, Ser 0.62 0.44 - 1.00 mg/dL   Calcium 10.2 8.9 - 10.3 mg/dL   GFR, Estimated >60 >60 mL/min    Comment: (NOTE) Calculated using the CKD-EPI Creatinine Equation (2021)    Anion gap 7 5 - 15    Comment: Performed at [facility], [place] [address], [place], [place] [address]  Phosphorus     Status: Abnormal   Collection Time: 10/28/20  7:20 AM  Result  Value Ref Range   Phosphorus 2.3 (L) 2.5 - 4.6 mg/dL    Comment: Performed at [facility], [place] [address], [place], [place] [address]  Magnesium     Status: None   Collection Time: 10/28/20  7:20 AM  Result Value Ref Range   Magnesium 2.0 1.7 - 2.4 mg/dL    Comment: Performed at [facility], [place] [address] [place], [place] [address]   DG Chest 2 View  Result Date: 10/26/2020 CLINICAL DATA:  Shortness of breath.  Fatigue. EXAM: CHEST - 2 VIEW COMPARISON:  Radiograph 08/19/2015.  CT 12/16/2017 FINDINGS: Upper normal heart size with normal mediastinal contours. No pulmonary edema, pleural effusion, or pneumothorax. No focal airspace disease. Mild biapical pleuroparenchymal scarring. Pulmonary nodules on prior CT are not well seen by radiograph. Chronic lower thoracic compression fracture, unchanged. IMPRESSION: No acute chest findings. Electronically Signed   By: Keith Rake M.D.   On: 10/26/2020 19:43      Assessment/Plan Iron deficiency anemia - likely secondary to chronic GI losses, hgb 9.0 Atrial fibrillation on eliquis - LD 5/11 AM HTN HLD  Ascending colon mass - colonoscopy today with non-obstructing ascending colonic mass, no active bleeding observed - check CEA and CT CAP for staging, will need MRI of liver since she has  an allergy to iodinated contrast - biopsies from colonoscopy pending  - await pathology for surgical planning, patient would like to go ahead and have surgery while she is here - do not advance past FLD, do not resume eliquis, ok for heparin gtt if needed - check prealbumin with AM labs  FEN: FLD VTE: SCDs, eliquis on hold ID: none currently   Norm Parcel, [facility] Surgery 10/28/2020, 1:31 PM Please see Amion for pager number during day hours 7:00am-4:30pm

## 2020-10-28 NOTE — Anesthesia Preprocedure Evaluation (Addendum)
Anesthesia Evaluation  Patient identified by MRN, date of birth, ID band Patient awake    Reviewed: Allergy & Precautions, NPO status , Patient's Chart, lab work & pertinent test results  Airway Mallampati: II  TM Distance: >3 FB     Dental   Pulmonary    breath sounds clear to auscultation       Cardiovascular hypertension, + dysrhythmias  Rhythm:Regular Rate:Normal     Neuro/Psych    GI/Hepatic Neg liver ROS, History noted Dr. Nyoka Cowden   Endo/Other  negative endocrine ROS  Renal/GU      Musculoskeletal   Abdominal   Peds  Hematology  (+) anemia ,   Anesthesia Other Findings   Reproductive/Obstetrics                             Anesthesia Physical Anesthesia Plan  ASA: III  Anesthesia Plan: MAC   Post-op Pain Management:    Induction: Intravenous  PONV Risk Score and Plan: 2 and Propofol infusion  Airway Management Planned: Nasal Cannula and Simple Face Mask  Additional Equipment:   Intra-op Plan:   Post-operative Plan:   Informed Consent: I have reviewed the patients History and Physical, chart, labs and discussed the procedure including the risks, benefits and alternatives for the proposed anesthesia with the patient or authorized representative who has indicated his/her understanding and acceptance.     Dental advisory given  Plan Discussed with: CRNA and Anesthesiologist  Anesthesia Plan Comments:         Anesthesia Quick Evaluation

## 2020-10-28 NOTE — Anesthesia Postprocedure Evaluation (Signed)
Anesthesia Post Note  Patient: Robin [name]  Procedure(s) Performed: COLONOSCOPY WITH PROPOFOL (N/A ) BIOPSY POLYPECTOMY SUBMUCOSAL TATTOO INJECTION     Patient location during evaluation: Endoscopy Anesthesia Type: MAC Level of consciousness: awake Pain management: pain level controlled Vital Signs Assessment: post-procedure vital signs reviewed and stable Respiratory status: spontaneous breathing Cardiovascular status: stable Postop Assessment: no apparent nausea or vomiting Anesthetic complications: no   No complications documented.  Last Vitals:  Vitals:   10/28/20 1351 10/28/20 1401  BP: 138/71 (!) 153/72  Pulse: 82 (!) 101  Resp: 11 16  Temp:    SpO2: 100% 100%    Last Pain:  Vitals:   10/28/20 1401  TempSrc:   PainSc: 0-No pain                 Tiphanie Vo

## 2020-10-28 NOTE — Progress Notes (Signed)
PROGRESS NOTE  Robin [name]  DOB: 27-Mar-1935  PCP: [name], PA-C EGB:[id number]  DOA: 10/26/2020  LOS: 1 day  Hospital Day: 3  Chief complaint: Anemia  Brief narrative: Robin [name] is a 86 y.o. female with PMH significant for A.Fib on eliquis, HTN. Patient was sent to the ED by PCP on 5/11 for intermittent bloody stools, bloody vomiting, shortness of breath and anemia with a hemoglobin low at 8 while on Eliquis.   In the ED, patient was afebrile, blood pressure elevated to 158/88, breathing on room air Labs with hemoglobin at 8.5, MCV 75, ferritin significantly low at 5, FOBT positive. Patient was admitted to hospitalist service. 5/13, patient underwent EGD and colonoscopy.  Subjective: Patient was seen and examined this morning.  Lying on bed.  Not in distress.  Daughter at bedside. Patient was waiting for EGD and colonoscopy. Later in the afternoon, I learned that patient was noted to have a nonobstructing ascending colon mass in his general surgery was consulted.   Assessment/Plan: Acute insidious GI bleeding Ascending colon mass -Presented with intermittent bloody stool, bloody vomiting, low hemoglobin and FOBT positive -Eliquis on hold. -GI consulted.  5/13, patient underwent EGD and colonoscopy. -Noted to have a nonobstructive ascending colon mass.  General surgery consulted.  Acute blood loss anemia Severe iron deficiency -Hemoglobin from 2019 was 13.7, now with a low hemoglobin 8 and low ferritin of 5 -Secondary to GI bleeding -Transfuse if less than 7. -5/12, IV iron given. Recent Labs    10/26/20 1919 10/26/20 1930 10/27/20 0551 10/28/20 0720  HGB 8.5*  --  8.0* 9.0*  MCV 75.0*  --  75.5* 75.4*  VITAMINB12  --  277  --   --   FOLATE  --  15.7  --   --   FERRITIN  --  5*  --   --   TIBC  --  525*  --   --   IRON  --  22*  --   --   RETICCTPCT 1.6  --   --   --    Paroxysmal A. fib -Continue Toprol 50 mg twice daily and Tiadylt  (diltiazem) 180 mg daily. -Eliquis on hold. -Continue to monitor in telemetry.  Essential hypertension -On metoprolol, diltiazem, triamterene and HCTZ -Continue the same.  Continue to monitor blood pressure  Hypokalemia/hypophosphatemia -Labs this morning with potassium level low at 3.2 and phosphorus level low at 2.3.   -Oral replacement ordered.   Potassium level low at 3.2.  Oral replacement given.  Continue to monitor. Recent Labs  Lab 10/26/20 1919 10/27/20 0551 10/28/20 0720  K 3.3* 3.2* 3.2*  MG  --   --  2.0  PHOS  --   --  2.3*   Mobility: Encourage ambulation Code Status:   Code Status: Full Code  Nutritional status: Body mass index is 24.98 kg/m.     Diet Order            Diet NPO time specified Except for: Sips with Meds  Diet effective ____                 DVT prophylaxis: SCDs Start: 10/26/20 2337   Antimicrobials:  None Fluid: IV iron Consultants: GI Family Communication:  Daughter at bedside  Status is: Inpatient  Remains inpatient appropriate because: Underwent EGD and colonoscopy today.  Dispo: The patient is from: Home              Anticipated d/c is to: Pending  clinical course              Patient currently is not medically stable to d/c.   Difficult to place patient No     Infusions:  . lactated ringers with kcl Stopped (10/28/20 0242)    Scheduled Meds: . [MAR Hold] atorvastatin  5 mg Oral Once per day on Mon Wed Fri  . [MAR Hold] cholecalciferol  2,000 Units Oral QHS  . [MAR Hold] diltiazem  180 mg Oral Daily  . [MAR Hold] estradiol  0.5 mg Oral Daily  . [MAR Hold] ferrous sulfate  325 mg Oral BID WC  . [MAR Hold] metoprolol succinate  50 mg Oral BID  . [MAR Hold] multivitamin  1 tablet Oral Daily  . phosphorus  500 mg Oral Once  . [MAR Hold] potassium chloride SA  20 mEq Oral Daily  . [MAR Hold] triamterene-hydrochlorothiazide  1 tablet Oral Daily    Antimicrobials: Anti-infectives (From admission, onward)   None       PRN meds: [MAR Hold] acetaminophen **OR** [MAR Hold] acetaminophen, [MAR Hold] diphenoxylate-atropine, [MAR Hold] metoprolol tartrate, [MAR Hold] ondansetron **OR** [MAR Hold] ondansetron (ZOFRAN) IV   Objective: Vitals:   10/28/20 1221 10/28/20 1341  BP: (!) 173/83 (!) 132/51  Pulse: 100   Resp: 19 19  Temp: 97.7 F (36.5 C)   SpO2: 100%     Intake/Output Summary (Last 24 hours) at 10/28/2020 1347 Last data filed at 10/28/2020 1340 Gross per 24 hour  Intake 720 ml  Output --  Net 720 ml   Filed Weights   10/26/20 1729  Weight: 66 kg   Weight change:  Body mass index is 24.98 kg/m.   Physical Exam: General exam: Pleasant, elderly Caucasian female.  Not in physical distress Skin: No rashes, lesions or ulcers. HEENT: Atraumatic, normocephalic, no obvious bleeding Lungs: Clear to auscultation bilaterally CVS: Regular rate and rhythm, no murmur GI/Abd soft, nontender, nondistended, bowel sound present CNS: Alert, awake, oriented x3 Psychiatry: Mood appropriate Extremities: No pedal edema, no calf redness  Data Review: I have personally reviewed the laboratory data and studies available.  Recent Labs  Lab 10/26/20 1919 10/27/20 0551 10/28/20 0720  WBC 6.3 6.3 7.0  NEUTROABS 3.9  --  4.8  HGB 8.5* 8.0* 9.0*  HCT 29.7* 27.8* 31.6*  MCV 75.0* 75.5* 75.4*  PLT 353 305 362   Recent Labs  Lab 10/26/20 1919 10/27/20 0551 10/28/20 0720  NA 135 137 135  K 3.3* 3.2* 3.2*  CL 103 105 104  CO2 28 27 24   GLUCOSE 106* 100* 111*  BUN 12 11 11   CREATININE 0.55 0.62 0.62  CALCIUM 10.1 9.6 10.2  MG  --   --  2.0  PHOS  --   --  2.3*    F/u labs ordered Unresulted Labs (From admission, onward)          Start     Ordered   10/28/20 1332  CEA  Once,   R        10/28/20 1333   10/28/20 0500  CBC with Differential/Platelet  Daily,   R      10/27/20 1518   10/28/20 7622  Basic metabolic panel  Daily,   R      10/27/20 1518          Signed, Terrilee Croak,  MD [facility] 10/28/2020

## 2020-10-29 ENCOUNTER — Inpatient Hospital Stay (HOSPITAL_COMMUNITY): Payer: Medicare PPO

## 2020-10-29 DIAGNOSIS — E876 Hypokalemia: Secondary | ICD-10-CM

## 2020-10-29 DIAGNOSIS — D509 Iron deficiency anemia, unspecified: Secondary | ICD-10-CM | POA: Diagnosis not present

## 2020-10-29 DIAGNOSIS — Z7901 Long term (current) use of anticoagulants: Secondary | ICD-10-CM | POA: Diagnosis not present

## 2020-10-29 DIAGNOSIS — K6389 Other specified diseases of intestine: Secondary | ICD-10-CM | POA: Diagnosis not present

## 2020-10-29 DIAGNOSIS — D500 Iron deficiency anemia secondary to blood loss (chronic): Secondary | ICD-10-CM | POA: Diagnosis not present

## 2020-10-29 DIAGNOSIS — I10 Essential (primary) hypertension: Secondary | ICD-10-CM

## 2020-10-29 LAB — CBC WITH DIFFERENTIAL/PLATELET
Abs Immature Granulocytes: 0.04 10*3/uL (ref 0.00–0.07)
Basophils Absolute: 0.1 10*3/uL (ref 0.0–0.1)
Basophils Relative: 1 %
Eosinophils Absolute: 0.2 10*3/uL (ref 0.0–0.5)
Eosinophils Relative: 3 %
HCT: 28 % — ABNORMAL LOW (ref 36.0–46.0)
Hemoglobin: 7.9 g/dL — ABNORMAL LOW (ref 12.0–15.0)
Immature Granulocytes: 1 %
Lymphocytes Relative: 17 %
Lymphs Abs: 1.3 10*3/uL (ref 0.7–4.0)
MCH: 21.3 pg — ABNORMAL LOW (ref 26.0–34.0)
MCHC: 28.2 g/dL — ABNORMAL LOW (ref 30.0–36.0)
MCV: 75.5 fL — ABNORMAL LOW (ref 80.0–100.0)
Monocytes Absolute: 0.8 10*3/uL (ref 0.1–1.0)
Monocytes Relative: 10 %
Neutro Abs: 5.5 10*3/uL (ref 1.7–7.7)
Neutrophils Relative %: 68 %
Platelets: 340 10*3/uL (ref 150–400)
RBC: 3.71 MIL/uL — ABNORMAL LOW (ref 3.87–5.11)
RDW: 15.5 % (ref 11.5–15.5)
WBC: 7.9 10*3/uL (ref 4.0–10.5)
nRBC: 0 % (ref 0.0–0.2)

## 2020-10-29 LAB — PREALBUMIN: Prealbumin: 10.8 mg/dL — ABNORMAL LOW (ref 18–38)

## 2020-10-29 LAB — BASIC METABOLIC PANEL
Anion gap: 8 (ref 5–15)
BUN: 10 mg/dL (ref 8–23)
CO2: 26 mmol/L (ref 22–32)
Calcium: 9.9 mg/dL (ref 8.9–10.3)
Chloride: 101 mmol/L (ref 98–111)
Creatinine, Ser: 0.67 mg/dL (ref 0.44–1.00)
GFR, Estimated: >60 mL/min (ref >60)
Glucose, Bld: 105 mg/dL — ABNORMAL HIGH (ref 70–99)
Potassium: 3 mmol/L — ABNORMAL LOW (ref 3.5–5.1)
Sodium: 135 mmol/L (ref 135–145)

## 2020-10-29 LAB — CEA: CEA: 0.6 ng/mL (ref 0.0–4.7)

## 2020-10-29 MED ORDER — POTASSIUM CHLORIDE CRYS ER 20 MEQ PO TBCR
40.0000 meq | EXTENDED_RELEASE_TABLET | ORAL | Status: AC
  Administered 2020-10-29 (×2): 40 meq via ORAL
  Filled 2020-10-29 (×2): qty 2

## 2020-10-29 MED ORDER — GADOBUTROL 1 MMOL/ML IV SOLN
6.0000 mL | Freq: Once | INTRAVENOUS | Status: AC | PRN
  Administered 2020-10-29: 6 mL via INTRAVENOUS

## 2020-10-29 NOTE — Progress Notes (Signed)
PROGRESS NOTE  Robin [name] NKN:[id number] DOB: 1935-01-28   PCP: [name], PA-C  Patient is from: Home.  Very independent at baseline.  DOA: 10/26/2020 LOS: 2  Chief complaints: Anemia  Brief Narrative / Interim history: 86 year old F with PMH of A. fib on Eliquis and HTN presented to ED for anemia and shortness of breath, and admitted for blood loss anemia due to GI bleed.  Hgb 8.5 (reportedly 11 in January 2022 per daughter).  Hemoccult positive.  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  Colonoscopy on 5/13 with "obvious malignant tumor adjacent to the IC valve (biopsied) and several colonic polyps which were removed and retrieved".   MRI abdomen with and without contrast without significant finding within the visualized portion of the abdomen.   General surgery consulted. Planning surgical resection early next week.  Subjective: Seen and examined this afternoon after she returned from an MRI.  No major events overnight of this morning.  No complaints.  She denies chest pain, shortness of breath, nausea, vomiting or abdominal pain.  Daughter at bedside.  Objective: Vitals:   10/28/20 1401 10/28/20 2140 10/29/20 0609 10/29/20 1343  BP: (!) 153/72 (!) 130/58 (!) 114/51 118/60  Pulse: (!) 101 68 75 63  Resp: 16 16 15 18   Temp:  97.9 F (36.6 C) 97.7 F (36.5 C) 97.7 F (36.5 C)  TempSrc:  Oral Oral Oral  SpO2: 100% 97% 95% 98%  Weight:      Height:        Intake/Output Summary (Last 24 hours) at 10/29/2020 1717 Last data filed at 10/28/2020 2120 Gross per 24 hour  Intake 673.52 ml  Output --  Net 673.52 ml   Filed Weights   10/26/20 1729  Weight: 66 kg    Examination:  GENERAL: No apparent distress.  Nontoxic. HEENT: MMM.  Vision and hearing grossly intact.  NECK: Supple.  No apparent JVD.  RESP: On RA.  No IWOB.  Fair aeration bilaterally. CVS: Irregular rhythm.   Normal rate.Marland Kitchen Heart sounds normal.  ABD/GI/GU: BS+. Abd soft, NTND.  MSK/EXT:  Moves extremities. No apparent deformity. No edema.  SKIN: no apparent skin lesion or wound NEURO: Awake, alert and oriented appropriately.  No apparent focal neuro deficit. PSYCH: Calm. Normal affect.  Procedures:  5/13-Colonoscopy on 5/13 with "obvious malignant tumor adjacent to the IC valve (biopsied) and several colonic polyps which were removed and retrieved".   Microbiology summarized: -COVID-19 and influenza PCR nonreactive.  Assessment & Plan: Severe iron deficiency anemia due to acute on chronic blood loss from GI bleed: FOBT positive.  Hgb 13.7 in 2019, and reportedly about 11 earlier this year.  Recent Labs    10/26/20 1919 10/27/20 0551 10/28/20 0720 10/29/20 0626  HGB 8.5* 8.0* 9.0* 7.9*  -H&H relatively stable -We will transfuse 1 unit in the morning given upcoming surgery -Received IV iron on 5/12.  Will give additional dose after 3 days -Monitor H&H -Continue holding Eliquis  Ascending colon mass with ileocecal adenopathy: Noted on CT abdomen and pelvis and colonoscopy.  -MRI as above. -Follow-up pathology from colonoscopy -General surgery following-recommended full liquid diet pending surgery -Continue holding Eliquis -Patient seems to be stable from cardiopulmonary standpoint.  No history of CHF or unstable arrhythmia.   Paroxysmal A. fib: On Toprol-XL, Cardizem and Eliquis at home.  CHA2DS2-VASc score 4 (age, sex, HTN) -Continue Toprol-XL and Cardizem -Continue holding Eliquis  Essential hypertension: Normotensive. -Continue metoprolol and  diltiazem. -Hold Maxzide in the setting of hypokalemia   Hypokalemia/hypophosphatemia: Hypokalemia could be due to HCTZ. -Hold Maxzide -K-Dur 40 mill equivalent x2  Nutrition: Prealbumin 10.8. -Consulted dietitian -Continue boost 3 times a day Body mass index is 24.98 kg/m.         DVT prophylaxis:  SCDs Start: 10/26/20  2337  Code Status: Full code Family Communication: Updated patient's daughter at bedside. Level of care: Telemetry Status is: Inpatient  Remains inpatient appropriate because:Persistent severe electrolyte disturbances, Ongoing diagnostic testing needed not appropriate for outpatient work up and Inpatient level of care appropriate due to severity of illness   Dispo: The patient is from: Home              Anticipated d/c is to: Home              Patient currently is not medically stable to d/c.   Difficult to place patient No       Consultants:  Gastroenterology General surgery   Sch Meds:  Scheduled Meds: . atorvastatin  5 mg Oral Once per day on Mon Wed Fri  . cholecalciferol  2,000 Units Oral QHS  . diltiazem  180 mg Oral Daily  . estradiol  0.5 mg Oral Daily  . feeding supplement  1 Container Oral TID BM  . ferrous sulfate  325 mg Oral BID WC  . metoprolol succinate  50 mg Oral BID  . multivitamin  1 tablet Oral Daily  . potassium chloride SA  20 mEq Oral Daily  . triamterene-hydrochlorothiazide  1 tablet Oral Daily   Continuous Infusions: PRN Meds:.acetaminophen **OR** acetaminophen, diphenoxylate-atropine, LORazepam, metoprolol tartrate, ondansetron **OR** ondansetron (ZOFRAN) IV  Antimicrobials: Anti-infectives (From admission, onward)   None       I have personally reviewed the following labs and images: CBC: Recent Labs  Lab 10/26/20 1919 10/27/20 0551 10/28/20 0720 10/29/20 0626  WBC 6.3 6.3 7.0 7.9  NEUTROABS 3.9  --  4.8 5.5  HGB 8.5* 8.0* 9.0* 7.9*  HCT 29.7* 27.8* 31.6* 28.0*  MCV 75.0* 75.5* 75.4* 75.5*  PLT 353 305 362 340   BMP &GFR Recent Labs  Lab 10/26/20 1919 10/27/20 0551 10/28/20 0720 10/29/20 0626  NA 135 137 135 135  K 3.3* 3.2* 3.2* 3.0*  CL 103 105 104 101  CO2 28 27 24 26   GLUCOSE 106* 100* 111* 105*  BUN 12 11 11 10   CREATININE 0.55 0.62 0.62 0.67  CALCIUM 10.1 9.6 10.2 9.9  MG  --   --  2.0  --   PHOS  --   --   2.3*  --    Estimated Creatinine Clearance: 49.8 mL/min (by C-G formula based on SCr of 0.67 mg/dL). Liver & Pancreas: Recent Labs  Lab 10/26/20 1919  AST 18  ALT 14  ALKPHOS 104  BILITOT 1.1  PROT 6.7  ALBUMIN 3.8   Recent Labs  Lab 10/26/20 1919  LIPASE 31   No results for input(s): AMMONIA in the last 168 hours. Diabetic: No results for input(s): HGBA1C in the last 72 hours. No results for input(s): GLUCAP in the last 168 hours. Cardiac Enzymes: No results for input(s): CKTOTAL, CKMB, CKMBINDEX, TROPONINI in the last 168 hours. No results for input(s): PROBNP in the last 8760 hours. Coagulation Profile: Recent Labs  Lab 10/26/20 1930  INR 1.4*   Thyroid Function Tests: No results for input(s): TSH, T4TOTAL, FREET4, T3FREE, THYROIDAB in the last 72 hours. Lipid Profile: No results for input(s): CHOL,  HDL, LDLCALC, TRIG, CHOLHDL, LDLDIRECT in the last 72 hours. Anemia Panel: Recent Labs    10/26/20 1919 10/26/20 1930  VITAMINB12  --  277  FOLATE  --  15.7  FERRITIN  --  5*  TIBC  --  525*  IRON  --  22*  RETICCTPCT 1.6  --    Urine analysis: No results found for: COLORURINE, APPEARANCEUR, LABSPEC, PHURINE, GLUCOSEU, HGBUR, BILIRUBINUR, KETONESUR, PROTEINUR, UROBILINOGEN, NITRITE, LEUKOCYTESUR Sepsis Labs: Invalid input(s): PROCALCITONIN, [place]  Microbiology: Recent Results (from the past 240 hour(s))  Resp Panel by RT-PCR (Flu A&B, Covid) Nasopharyngeal Swab     Status: None   Collection Time: 10/26/20 10:00 PM   Specimen: Nasopharyngeal Swab; Nasopharyngeal(NP) swabs in vial transport medium  Result Value Ref Range Status   SARS Coronavirus 2 by RT PCR NEGATIVE NEGATIVE Final    Comment: (NOTE) SARS-CoV-2 target nucleic acids are NOT DETECTED.  The SARS-CoV-2 RNA is generally detectable in upper respiratory specimens during the acute phase of infection. The lowest concentration of SARS-CoV-2 viral copies this assay can detect is 138 copies/mL.  A negative result does not preclude SARS-Cov-2 infection and should not be used as the sole basis for treatment or other patient management decisions. A negative result may occur with  improper specimen collection/handling, submission of specimen other than nasopharyngeal swab, presence of viral mutation(s) within the areas targeted by this assay, and inadequate number of viral copies(<138 copies/mL). A negative result must be combined with clinical observations, patient history, and epidemiological information. The expected result is Negative.  Fact Sheet for Patients:  EntrepreneurPulse.com.au  Fact Sheet for Healthcare Providers:  IncredibleEmployment.be  This test is no t yet approved or cleared by the [place] FDA and  has been authorized for detection and/or diagnosis of SARS-CoV-2 by FDA under an Emergency Use Authorization (EUA). This EUA will remain  in effect (meaning this test can be used) for the duration of the COVID-19 declaration under Section 564(b)(1) of the Act, 21 U.S.C.section 360bbb-3(b)(1), unless the authorization is terminated  or revoked sooner.       Influenza A by PCR NEGATIVE NEGATIVE Final   Influenza B by PCR NEGATIVE NEGATIVE Final    Comment: (NOTE) The Xpert Xpress SARS-CoV-2/FLU/RSV plus assay is intended as an aid in the diagnosis of influenza from Nasopharyngeal swab specimens and should not be used as a sole basis for treatment. Nasal washings and aspirates are unacceptable for Xpert Xpress SARS-CoV-2/FLU/RSV testing.  Fact Sheet for Patients: EntrepreneurPulse.com.au  Fact Sheet for Healthcare Providers: IncredibleEmployment.be  This test is not yet approved or cleared by the [place] FDA and has been authorized for detection and/or diagnosis of SARS-CoV-2 by FDA under an Emergency Use Authorization (EUA). This EUA will remain in effect (meaning this test  can be used) for the duration of the COVID-19 declaration under Section 564(b)(1) of the Act, 21 U.S.C. section 360bbb-3(b)(1), unless the authorization is terminated or revoked.  Performed at [facility], [place] [address] [place], [place] [address]     Radiology Studies: MR LIVER W WO CONTRAST  Result Date: 10/29/2020 CLINICAL DATA:  Colorectal cancer staging EXAM: MRI ABDOMEN WITHOUT AND WITH CONTRAST TECHNIQUE: Multiplanar multisequence MR imaging of the abdomen was performed both before and after the administration of intravenous contrast. CONTRAST:  27mL GADAVIST GADOBUTROL 1 MMOL/ML IV SOLN COMPARISON:  10/28/2020 FINDINGS: Examination is generally somewhat limited by breath motion artifact throughout. Lower chest: No acute findings. Hepatobiliary: Hepatic iron deposition. No mass or other  parenchymal abnormality identified. Status post cholecystectomy. No biliary ductal dilatation. Pancreas: No mass, inflammatory changes, or other parenchymal abnormality identified. Spleen:  Splenic iron deposition. Adrenals/Urinary Tract: No masses identified. Large, benign exophytic cyst arising from the inferior pole of the right kidney measuring at least 11.0 cm (series 5, image 29). Multiple additional small cortical and parapelvic cysts. No hydronephrosis. Stomach/Bowel: Visualized portions within the abdomen are unremarkable. Vascular/Lymphatic: No pathologically enlarged lymph nodes identified. No abdominal aortic aneurysm demonstrated. Aortic atherosclerosis. Other:  None. Musculoskeletal: No suspicious bone lesions identified. Bone marrow iron deposition. IMPRESSION: 1. Examination is generally somewhat limited by breath motion artifact. Within this limitation, no evidence of lymphadenopathy or metastatic disease in the abdomen. 2. Hepatic, splenic, and bone marrow iron deposition. Correlate for history of transfusion or other etiology of iron overload. 3. Status post cholecystectomy.  Electronically Signed   By: Eddie Candle M.D.   On: 10/29/2020 13:11      Jhoanna Heyde T. [place]  If 7PM-7AM, please contact night-coverage [email or web address] 10/29/2020, 5:17 PM

## 2020-10-29 NOTE — H&P (View-Only) (Signed)
1 Day Post-Op   Subjective/Chief Complaint: No complaints this morning   Objective: Vital signs in last 24 hours: Temp:  [97.7 F (36.5 C)-97.9 F (36.6 C)] 97.7 F (36.5 C) (05/14 0609) Pulse Rate:  [68-101] 75 (05/14 0609) Resp:  [11-19] 15 (05/14 0609) BP: (114-173)/(51-83) 114/51 (05/14 0609) SpO2:  [95 %-100 %] 95 % (05/14 0609) Last BM Date: 10/28/20  Intake/Output from previous day: 05/13 0701 - 05/14 0700 In: 1173.5 [P.O.:600; I.V.:573.5] Out: -  Intake/Output this shift: No intake/output data recorded.  Exam: Abdomen soft, non tender Awake and alert  Lab Results:  Recent Labs    10/28/20 0720 10/29/20 0626  WBC 7.0 7.9  HGB 9.0* 7.9*  HCT 31.6* 28.0*  PLT 362 340   BMET Recent Labs    10/28/20 0720 10/29/20 0626  NA 135 135  K 3.2* 3.0*  CL 104 101  CO2 24 26  GLUCOSE 111* 105*  BUN 11 10  CREATININE 0.62 0.67  CALCIUM 10.2 9.9   PT/INR Recent Labs    10/26/20 1930  LABPROT 16.9*  INR 1.4*   ABG No results for input(s): PHART, HCO3 in the last 72 hours.  Invalid input(s): PCO2, PO2  Studies/Results: CT CHEST ABDOMEN PELVIS WO CONTRAST  Result Date: 10/29/2020 CLINICAL DATA:  Colorectal carcinoma.  Staging. EXAM: CT CHEST, ABDOMEN AND PELVIS WITHOUT CONTRAST TECHNIQUE: Multidetector CT imaging of the chest, abdomen and pelvis was performed following the standard protocol without IV contrast. COMPARISON:  CT chest 12/16/2017 and CT AP 03/08/2014 FINDINGS: CT CHEST FINDINGS Cardiovascular: The heart size appears within normal limits. Aortic atherosclerosis. Coronary artery calcifications. No pericardial effusion. Mediastinum/Nodes: No enlarged mediastinal, hilar, or axillary lymph nodes. Thyroid gland, trachea, and esophagus demonstrate no significant findings. Lungs/Pleura: No pleural effusion, airspace consolidation, or pneumothorax. Again seen are chronic parenchymal changes likely due to indolent atypical infection such as MAI. This  includes bilateral areas scarring, peripheral tree-in-bud nodularity architectural distortion, cylindrical bronchiectasis and volume loss. This predominantly affects the right middle lobe and lingula as well as the anterior right lower lobe. Imaging findings appears similar to the previous exam. Scattered solid round nodules are identified, which, although nonspecific, are likely the sequelae of chronic indolent infection and appears similar to previous exam. These include: -4 mm peripheral nodule in the right lower lobe, image 95/4. This is unchanged from previous exam. Two adjacent solid-appearing nodules are also unchanged from the previous exam, image 93/4. -within the anterolateral left lower lobe there is a nodule measuring 4 mm, image 119/4. Unchanged from previous exam. No new suspicious lung nodules identified. Musculoskeletal: No chest wall mass or suspicious bone lesions identified. There is remote superior endplate deformity involving T11 and T12. Unchanged from previous exam. CT ABDOMEN PELVIS FINDINGS Hepatobiliary: Within the limitations of unenhanced technique there is no focal liver abnormality. Previous cholecystectomy. No biliary dilatation. Pancreas: Unremarkable. No pancreatic ductal dilatation or surrounding inflammatory changes. Spleen: Normal in size without focal abnormality. Adrenals/Urinary Tract: Normal appearance of the adrenal glands. Large right kidney cyst measures 10.5 cm. No hydronephrosis or nephrolithiasis identified bilaterally. Urinary bladder appears normal for degree of distension. Stomach/Bowel: Stomach is within normal limits. The appendix is difficult to visualize within its entirety separate from right lower quadrant bowel loops. No secondary signs of acute appendicitis however no bowel wall thickening, inflammation, or distension. The soft tissue lesion near the ileocecal valve is identified and measures approximately 2.9 cm, image 101/2 and image 31/5. Multiple small  ileocolic lymph nodes are identified  which measure up to 7 mm. Vascular/Lymphatic: Aortic atherosclerosis. No enlarged abdominal or pelvic lymph nodes. Reproductive: Status post hysterectomy. No adnexal masses. Other: No free fluid or fluid collections. Musculoskeletal: Degenerative disc disease identified within the thoracolumbar spine. IMPRESSION: 1. Masslike soft tissue lesion near the ileocecal valve is identified compatible with primary colonic neoplasm. Small ileocolic lymph nodes are identified which measure up to 7 mm. No findings of solid organ metastasis or nodal metastasis within the chest, abdomen or pelvis. 2. Chronic parenchymal changes within both lungs likely due to indolent atypical infection such as MAI. 3. Aortic atherosclerosis and coronary artery calcifications. Aortic Atherosclerosis (ICD10-I70.0). Electronically Signed   By: Kerby Moors M.D.   On: 10/29/2020 06:45    Anti-infectives: Anti-infectives (From admission, onward)   None      Assessment/Plan: Iron deficiency anemia - likely secondary to chronic GI losses, hgb 7.9 Atrial fibrillation on eliquis - LD 5/11 AM HTN HLD  Ascending colon mass - colonoscopy yesterday with non-obstructing ascending colonic mass, no active bleeding observed - CEA normal, CT shows mass colon mass near ileocecal valve and several small nodes in the area.   No evidence of metastatic disease - biopsies from colonoscopy pending  - await pathology for surgical planning, patient would like to go ahead and have surgery while she is here - do not advance past FLD, do not resume eliquis, ok for heparin gtt if needed - prealbumin low.  Recommend nutrition consult  Dr. Johney Maine is the acute care surgeon starting Monday and will be the surgeon making further decisions about the timing of surgery. He will see her Monday. We will see PRN the rest of the weekend.  LOS: 2 days    Robin [name] 10/29/2020

## 2020-10-29 NOTE — Progress Notes (Signed)
1 Day Post-Op   Subjective/Chief Complaint: No complaints this morning   Objective: Vital signs in last 24 hours: Temp:  [97.7 F (36.5 C)-97.9 F (36.6 C)] 97.7 F (36.5 C) (05/14 0609) Pulse Rate:  [68-101] 75 (05/14 0609) Resp:  [11-19] 15 (05/14 0609) BP: (114-173)/(51-83) 114/51 (05/14 0609) SpO2:  [95 %-100 %] 95 % (05/14 0609) Last BM Date: 10/28/20  Intake/Output from previous day: 05/13 0701 - 05/14 0700 In: 1173.5 [P.O.:600; I.V.:573.5] Out: -  Intake/Output this shift: No intake/output data recorded.  Exam: Abdomen soft, non tender Awake and alert  Lab Results:  Recent Labs    10/28/20 0720 10/29/20 0626  WBC 7.0 7.9  HGB 9.0* 7.9*  HCT 31.6* 28.0*  PLT 362 340   BMET Recent Labs    10/28/20 0720 10/29/20 0626  NA 135 135  K 3.2* 3.0*  CL 104 101  CO2 24 26  GLUCOSE 111* 105*  BUN 11 10  CREATININE 0.62 0.67  CALCIUM 10.2 9.9   PT/INR Recent Labs    10/26/20 1930  LABPROT 16.9*  INR 1.4*   ABG No results for input(s): PHART, HCO3 in the last 72 hours.  Invalid input(s): PCO2, PO2  Studies/Results: CT CHEST ABDOMEN PELVIS WO CONTRAST  Result Date: 10/29/2020 CLINICAL DATA:  Colorectal carcinoma.  Staging. EXAM: CT CHEST, ABDOMEN AND PELVIS WITHOUT CONTRAST TECHNIQUE: Multidetector CT imaging of the chest, abdomen and pelvis was performed following the standard protocol without IV contrast. COMPARISON:  CT chest 12/16/2017 and CT AP 03/08/2014 FINDINGS: CT CHEST FINDINGS Cardiovascular: The heart size appears within normal limits. Aortic atherosclerosis. Coronary artery calcifications. No pericardial effusion. Mediastinum/Nodes: No enlarged mediastinal, hilar, or axillary lymph nodes. Thyroid gland, trachea, and esophagus demonstrate no significant findings. Lungs/Pleura: No pleural effusion, airspace consolidation, or pneumothorax. Again seen are chronic parenchymal changes likely due to indolent atypical infection such as MAI. This  includes bilateral areas scarring, peripheral tree-in-bud nodularity architectural distortion, cylindrical bronchiectasis and volume loss. This predominantly affects the right middle lobe and lingula as well as the anterior right lower lobe. Imaging findings appears similar to the previous exam. Scattered solid round nodules are identified, which, although nonspecific, are likely the sequelae of chronic indolent infection and appears similar to previous exam. These include: -4 mm peripheral nodule in the right lower lobe, image 95/4. This is unchanged from previous exam. Two adjacent solid-appearing nodules are also unchanged from the previous exam, image 93/4. -within the anterolateral left lower lobe there is a nodule measuring 4 mm, image 119/4. Unchanged from previous exam. No new suspicious lung nodules identified. Musculoskeletal: No chest wall mass or suspicious bone lesions identified. There is remote superior endplate deformity involving T11 and T12. Unchanged from previous exam. CT ABDOMEN PELVIS FINDINGS Hepatobiliary: Within the limitations of unenhanced technique there is no focal liver abnormality. Previous cholecystectomy. No biliary dilatation. Pancreas: Unremarkable. No pancreatic ductal dilatation or surrounding inflammatory changes. Spleen: Normal in size without focal abnormality. Adrenals/Urinary Tract: Normal appearance of the adrenal glands. Large right kidney cyst measures 10.5 cm. No hydronephrosis or nephrolithiasis identified bilaterally. Urinary bladder appears normal for degree of distension. Stomach/Bowel: Stomach is within normal limits. The appendix is difficult to visualize within its entirety separate from right lower quadrant bowel loops. No secondary signs of acute appendicitis however no bowel wall thickening, inflammation, or distension. The soft tissue lesion near the ileocecal valve is identified and measures approximately 2.9 cm, image 101/2 and image 31/5. Multiple small  ileocolic lymph nodes are identified  which measure up to 7 mm. Vascular/Lymphatic: Aortic atherosclerosis. No enlarged abdominal or pelvic lymph nodes. Reproductive: Status post hysterectomy. No adnexal masses. Other: No free fluid or fluid collections. Musculoskeletal: Degenerative disc disease identified within the thoracolumbar spine. IMPRESSION: 1. Masslike soft tissue lesion near the ileocecal valve is identified compatible with primary colonic neoplasm. Small ileocolic lymph nodes are identified which measure up to 7 mm. No findings of solid organ metastasis or nodal metastasis within the chest, abdomen or pelvis. 2. Chronic parenchymal changes within both lungs likely due to indolent atypical infection such as MAI. 3. Aortic atherosclerosis and coronary artery calcifications. Aortic Atherosclerosis (ICD10-I70.0). Electronically Signed   By: Kerby Moors M.D.   On: 10/29/2020 06:45    Anti-infectives: Anti-infectives (From admission, onward)   None      Assessment/Plan: Iron deficiency anemia - likely secondary to chronic GI losses, hgb 7.9 Atrial fibrillation on eliquis - LD 5/11 AM HTN HLD  Ascending colon mass - colonoscopy yesterday with non-obstructing ascending colonic mass, no active bleeding observed - CEA normal, CT shows mass colon mass near ileocecal valve and several small nodes in the area.   No evidence of metastatic disease - biopsies from colonoscopy pending  - await pathology for surgical planning, patient would like to go ahead and have surgery while she is here - do not advance past FLD, do not resume eliquis, ok for heparin gtt if needed - prealbumin low.  Recommend nutrition consult  Dr. Johney Maine is the acute care surgeon starting Monday and will be the surgeon making further decisions about the timing of surgery. He will see her Monday. We will see PRN the rest of the weekend.  LOS: 2 days    Robin [name] 10/29/2020

## 2020-10-30 DIAGNOSIS — K6389 Other specified diseases of intestine: Secondary | ICD-10-CM | POA: Diagnosis not present

## 2020-10-30 DIAGNOSIS — D500 Iron deficiency anemia secondary to blood loss (chronic): Secondary | ICD-10-CM | POA: Diagnosis not present

## 2020-10-30 DIAGNOSIS — D509 Iron deficiency anemia, unspecified: Secondary | ICD-10-CM | POA: Diagnosis not present

## 2020-10-30 DIAGNOSIS — Z7901 Long term (current) use of anticoagulants: Secondary | ICD-10-CM | POA: Diagnosis not present

## 2020-10-30 LAB — RENAL FUNCTION PANEL
Albumin: 3.3 g/dL — ABNORMAL LOW (ref 3.5–5.0)
Anion gap: 5 (ref 5–15)
BUN: 11 mg/dL (ref 8–23)
CO2: 27 mmol/L (ref 22–32)
Calcium: 9.9 mg/dL (ref 8.9–10.3)
Chloride: 104 mmol/L (ref 98–111)
Creatinine, Ser: 0.6 mg/dL (ref 0.44–1.00)
GFR, Estimated: >60 mL/min (ref >60)
Glucose, Bld: 102 mg/dL — ABNORMAL HIGH (ref 70–99)
Phosphorus: 2.4 mg/dL — ABNORMAL LOW (ref 2.5–4.6)
Potassium: 4.3 mmol/L (ref 3.5–5.1)
Sodium: 136 mmol/L (ref 135–145)

## 2020-10-30 LAB — CBC
HCT: 29.7 % — ABNORMAL LOW (ref 36.0–46.0)
Hemoglobin: 8.4 g/dL — ABNORMAL LOW (ref 12.0–15.0)
MCH: 21.5 pg — ABNORMAL LOW (ref 26.0–34.0)
MCHC: 28.3 g/dL — ABNORMAL LOW (ref 30.0–36.0)
MCV: 76.2 fL — ABNORMAL LOW (ref 80.0–100.0)
Platelets: 344 10*3/uL (ref 150–400)
RBC: 3.9 MIL/uL (ref 3.87–5.11)
RDW: 15.9 % — ABNORMAL HIGH (ref 11.5–15.5)
WBC: 8.6 10*3/uL (ref 4.0–10.5)
nRBC: 0.4 % — ABNORMAL HIGH (ref 0.0–0.2)

## 2020-10-30 LAB — PREPARE RBC (CROSSMATCH)

## 2020-10-30 LAB — MAGNESIUM: Magnesium: 2 mg/dL (ref 1.7–2.4)

## 2020-10-30 MED ORDER — SODIUM CHLORIDE 0.9% IV SOLUTION: Freq: Once | INTRAVENOUS | Status: AC

## 2020-10-30 MED ORDER — BOOST / RESOURCE BREEZE PO LIQD CUSTOM
1.0000 | Freq: Two times a day (BID) | ORAL | Status: DC
  Administered 2020-11-01: 1 via ORAL

## 2020-10-30 MED ORDER — ENSURE ENLIVE PO LIQD: 237.0000 mL | Freq: Two times a day (BID) | ORAL | Status: DC

## 2020-10-30 MED ORDER — SODIUM CHLORIDE 0.9 % IV SOLN
510.0000 mg | Freq: Once | INTRAVENOUS | Status: AC
  Administered 2020-10-30: 510 mg via INTRAVENOUS
  Filled 2020-10-30: qty 510

## 2020-10-30 NOTE — Progress Notes (Signed)
PROGRESS NOTE  Robin [name]:[id number] DOB: [birth year]   PCP: [name], PA-C  Patient is from: Home.  Very independent at baseline.  DOA: 10/26/2020 LOS: 3  Chief complaints: Anemia  Brief Narrative / Interim history: 86 year old F with PMH of A. fib on Eliquis and HTN presented to ED for anemia and shortness of breath, and admitted for blood loss anemia due to GI bleed.  Hgb 8.5 (reportedly 11 in January 2022 per daughter).  Hemoccult positive.  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  Colonoscopy on 5/13 with "obvious malignant tumor adjacent to the IC valve (biopsied) and several colonic polyps which were removed and retrieved".   MRI abdomen with and without contrast without significant finding within the visualized portion of the abdomen.   General surgery consulted. Planning surgical resection early next week.  Subjective: Seen and examined earlier this morning.  No major events overnight of this morning.  No complaints.  She denies chest pain, dyspnea, palpitation, GI or UTI symptoms.  She denies melena or hematochezia.  Objective: Vitals:   10/29/20 1343 10/29/20 2105 10/29/20 2118 10/30/20 0538  BP: 118/60 108/64  110/63  Pulse: 63 64 80 76  Resp: 18  16 18   Temp: 97.7 F (36.5 C)  98.2 F (36.8 C) 97.6 F (36.4 C)  TempSrc: Oral  Oral Oral  SpO2: 98%  96% 97%  Weight:      Height:        Intake/Output Summary (Last 24 hours) at 10/30/2020 1234 Last data filed at 10/30/2020 0800 Gross per 24 hour  Intake 420 ml  Output --  Net 420 ml   Filed Weights   10/26/20 1729  Weight: 66 kg    Examination:  GENERAL: No apparent distress.  Nontoxic. HEENT: MMM.  Vision and hearing grossly intact.  NECK: Supple.  No apparent JVD.  RESP: On RA.  No IWOB.  Fair aeration bilaterally. CVS: Irregular rhythm.  Normal rate.  Heart sounds normal.  ABD/GI/GU: BS+. Abd  soft, NTND.  MSK/EXT:  Moves extremities. No apparent deformity. No edema.  SKIN: no apparent skin lesion or wound NEURO: Awake and alert. Oriented appropriately.  No apparent focal neuro deficit. PSYCH: Calm. Normal affect.  Procedures:  5/13-Colonoscopy on 5/13 with "obvious malignant tumor adjacent to the IC valve (biopsied) and several colonic polyps which were removed and retrieved".   Microbiology summarized: -COVID-19 and influenza PCR nonreactive.  Assessment & Plan: Severe iron deficiency anemia due to acute on chronic blood loss from GI bleed: FOBT positive.  Hgb 13.7 in 2019, and reportedly about 12 in January 2022 per daughter. Recent Labs    10/26/20 1919 10/27/20 0551 10/28/20 0720 10/29/20 0626 10/30/20 0525  HGB 8.5* 8.0* 9.0* 7.9* 8.4*  -H&H relatively stable -Transfuse 1 unit -Received IV iron on 5/12.  Will give additional dose today. -Monitor H&H -Continue holding Eliquis  Ascending colon mass with ileocecal adenopathy: Noted on CT abdomen and pelvis and colonoscopy.  -MRI as above. -Follow-up pathology from colonoscopy -General surgery following-recommended full liquid diet pending surgery -Continue holding Eliquis -Patient seems to be stable from cardiopulmonary standpoint.  No history of CHF or unstable arrhythmia.   Paroxysmal A. fib: On Toprol-XL, Cardizem and Eliquis at home.  CHA2DS2-VASc score 4 (age, sex, HTN) -Continue Toprol-XL and Cardizem -Continue holding Eliquis  Essential hypertension: Normotensive. -Continue metoprolol and diltiazem. -Hold Maxzide in the setting of hypokalemia   Hypokalemia/hypophosphatemia: Hypokalemia could be  due to HCTZ.  Resolved.   Nutrition: Prealbumin 10.8. -Consulted dietitian -Continue boost 3 times a day Body mass index is 24.98 kg/m.         DVT prophylaxis:  SCDs Start: 10/26/20 2337  Code Status: Full code Family Communication: Updated patient's daughter at bedside. Level of care:  Telemetry Status is: Inpatient  Remains inpatient appropriate because:Ongoing diagnostic testing needed not appropriate for outpatient work up and Inpatient level of care appropriate due to severity of illness   Dispo: The patient is from: Home              Anticipated d/c is to: Home              Patient currently is not medically stable to d/c.   Difficult to place patient No       Consultants:  Gastroenterology General surgery   Sch Meds:  Scheduled Meds: . sodium chloride   Intravenous Once  . atorvastatin  5 mg Oral Once per day on Mon Wed Fri  . cholecalciferol  2,000 Units Oral QHS  . diltiazem  180 mg Oral Daily  . estradiol  0.5 mg Oral Daily  . feeding supplement  1 Container Oral TID BM  . ferrous sulfate  325 mg Oral BID WC  . metoprolol succinate  50 mg Oral BID  . multivitamin  1 tablet Oral Daily  . potassium chloride SA  20 mEq Oral Daily   Continuous Infusions: . ferumoxytol     PRN Meds:.acetaminophen **OR** acetaminophen, diphenoxylate-atropine, LORazepam, metoprolol tartrate, ondansetron **OR** ondansetron (ZOFRAN) IV  Antimicrobials: Anti-infectives (From admission, onward)   None       I have personally reviewed the following labs and images: CBC: Recent Labs  Lab 10/26/20 1919 10/27/20 0551 10/28/20 0720 10/29/20 0626 10/30/20 0525  WBC 6.3 6.3 7.0 7.9 8.6  NEUTROABS 3.9  --  4.8 5.5  --   HGB 8.5* 8.0* 9.0* 7.9* 8.4*  HCT 29.7* 27.8* 31.6* 28.0* 29.7*  MCV 75.0* 75.5* 75.4* 75.5* 76.2*  PLT 353 305 362 340 344   BMP &GFR Recent Labs  Lab 10/26/20 1919 10/27/20 0551 10/28/20 0720 10/29/20 0626 10/30/20 0525  NA 135 137 135 135 136  K 3.3* 3.2* 3.2* 3.0* 4.3  CL 103 105 104 101 104  CO2 28 27 24 26 27   GLUCOSE 106* 100* 111* 105* 102*  BUN 12 11 11 10 11   CREATININE 0.55 0.62 0.62 0.67 0.60  CALCIUM 10.1 9.6 10.2 9.9 9.9  MG  --   --  2.0  --  2.0  PHOS  --   --  2.3*  --  2.4*   Estimated Creatinine Clearance:  49.8 mL/min (by C-G formula based on SCr of 0.6 mg/dL). Liver & Pancreas: Recent Labs  Lab 10/26/20 1919 10/30/20 0525  AST 18  --   ALT 14  --   ALKPHOS 104  --   BILITOT 1.1  --   PROT 6.7  --   ALBUMIN 3.8 3.3*   Recent Labs  Lab 10/26/20 1919  LIPASE 31   No results for input(s): AMMONIA in the last 168 hours. Diabetic: No results for input(s): HGBA1C in the last 72 hours. No results for input(s): GLUCAP in the last 168 hours. Cardiac Enzymes: No results for input(s): CKTOTAL, CKMB, CKMBINDEX, TROPONINI in the last 168 hours. No results for input(s): PROBNP in the last 8760 hours. Coagulation Profile: Recent Labs  Lab 10/26/20 1930  INR 1.4*  Thyroid Function Tests: No results for input(s): TSH, T4TOTAL, FREET4, T3FREE, THYROIDAB in the last 72 hours. Lipid Profile: No results for input(s): CHOL, HDL, LDLCALC, TRIG, CHOLHDL, LDLDIRECT in the last 72 hours. Anemia Panel: No results for input(s): VITAMINB12, FOLATE, FERRITIN, TIBC, IRON, RETICCTPCT in the last 72 hours. Urine analysis: No results found for: COLORURINE, APPEARANCEUR, LABSPEC, PHURINE, GLUCOSEU, HGBUR, BILIRUBINUR, KETONESUR, PROTEINUR, UROBILINOGEN, NITRITE, LEUKOCYTESUR Sepsis Labs: Invalid input(s): PROCALCITONIN, [place]  Microbiology: Recent Results (from the past 240 hour(s))  Resp Panel by RT-PCR (Flu A&B, Covid) Nasopharyngeal Swab     Status: None   Collection Time: 10/26/20 10:00 PM   Specimen: Nasopharyngeal Swab; Nasopharyngeal(NP) swabs in vial transport medium  Result Value Ref Range Status   SARS Coronavirus 2 by RT PCR NEGATIVE NEGATIVE Final    Comment: (NOTE) SARS-CoV-2 target nucleic acids are NOT DETECTED.  The SARS-CoV-2 RNA is generally detectable in upper respiratory specimens during the acute phase of infection. The lowest concentration of SARS-CoV-2 viral copies this assay can detect is 138 copies/mL. A negative result does not preclude SARS-Cov-2 infection and  should not be used as the sole basis for treatment or other patient management decisions. A negative result may occur with  improper specimen collection/handling, submission of specimen other than nasopharyngeal swab, presence of viral mutation(s) within the areas targeted by this assay, and inadequate number of viral copies(<138 copies/mL). A negative result must be combined with clinical observations, patient history, and epidemiological information. The expected result is Negative.  Fact Sheet for Patients:  EntrepreneurPulse.com.au  Fact Sheet for Healthcare Providers:  IncredibleEmployment.be  This test is no t yet approved or cleared by the [place] FDA and  has been authorized for detection and/or diagnosis of SARS-CoV-2 by FDA under an Emergency Use Authorization (EUA). This EUA will remain  in effect (meaning this test can be used) for the duration of the COVID-19 declaration under Section 564(b)(1) of the Act, 21 U.S.C.section 360bbb-3(b)(1), unless the authorization is terminated  or revoked sooner.       Influenza A by PCR NEGATIVE NEGATIVE Final   Influenza B by PCR NEGATIVE NEGATIVE Final    Comment: (NOTE) The Xpert Xpress SARS-CoV-2/FLU/RSV plus assay is intended as an aid in the diagnosis of influenza from Nasopharyngeal swab specimens and should not be used as a sole basis for treatment. Nasal washings and aspirates are unacceptable for Xpert Xpress SARS-CoV-2/FLU/RSV testing.  Fact Sheet for Patients: EntrepreneurPulse.com.au  Fact Sheet for Healthcare Providers: IncredibleEmployment.be  This test is not yet approved or cleared by the [place] FDA and has been authorized for detection and/or diagnosis of SARS-CoV-2 by FDA under an Emergency Use Authorization (EUA). This EUA will remain in effect (meaning this test can be used) for the duration of the COVID-19 declaration  under Section 564(b)(1) of the Act, 21 U.S.C. section 360bbb-3(b)(1), unless the authorization is terminated or revoked.  Performed at [facility], [place] [address], [place], [place] [address]     Radiology Studies: No results found.    Antonio Creswell T. Brack[place]ridge  If 7PM-7AM, please contact night-coverage [email or web address] 10/30/2020, 12:34 PM

## 2020-10-30 NOTE — Progress Notes (Signed)
Initial Nutrition Assessment  RD working remotely.   DOCUMENTATION CODES:   Not applicable  INTERVENTION:  - continue Boost Breeze but will decrease from TID to BID, each supplement provides 250 kcal and 9 grams of protein. - will order Ensure Enlive BID, each supplement provides 350 kcal and 20 grams of protein - diet advancement as medically feasible. - complete NFPE when feasible.    NUTRITION DIAGNOSIS:   Increased nutrient needs related to acute illness as evidenced by estimated needs.  GOAL:   Patient will meet greater than or equal to 90% of their needs  MONITOR:   PO intake,Supplement acceptance,Labs,Weight trends  REASON FOR ASSESSMENT:   Consult Assessment of nutrition requirement/status  ASSESSMENT:   86 y.o. female with medical history of A.Fib on Eliquis and HTN. She presented to the ED for evaluation of anemia and SOB which has been ongoing since 06/2020.  Patient is out of the room to MRI. Diet advanced from NPO to Heart Healthy on 5/13 at 1515 and then changed to [place] at 1546. The only documented meal intake since that time was 45% of dinner on 5/13.  Boost Breeze ordered TID and patient has been accepting this supplement 100% of the time offered.   She has not been seen by a [place] RD at any time in the past.  Weight on 5/11 (date of admission) was 145.5 lb and weight has been stable since 02/12/19. No information documented in the edema section of flow sheet.    Labs reviewed; Phos: 2.4 mg/dl. Medications reviewed; 2000 units cholecalciferol/day, 325 mg ferrous sulfate BID, 510 mg feraheme x1 run 5/15, 1 tablet prosight/day, 20 mEq Klor-Con/day.     NUTRITION - FOCUSED PHYSICAL EXAM:  unable to complete at this time.   Diet Order:   Diet Order            Diet full liquid Room service appropriate? Yes; Fluid consistency: Thin  Diet effective now                 EDUCATION NEEDS:   Not appropriate for education at this time  Skin:   Skin Assessment: Reviewed RN Assessment  Last BM:  5/13 (type 7)  Height:   Ht Readings from Last 1 Encounters:  10/26/20 5\' 4"  (1.626 m)    Weight:   Wt Readings from Last 1 Encounters:  10/26/20 66 kg     Estimated Nutritional Needs:  Kcal:  1500-1700 kcal Protein:  70-85 grams Fluid:  >/= 1.6 L/day       Jarome Matin, MS, RD, LDN, CNSC Inpatient Clinical Dietitian RD pager # available in AMION  After hours/weekend pager # available in [facility]

## 2020-10-31 ENCOUNTER — Inpatient Hospital Stay (HOSPITAL_COMMUNITY): Payer: Medicare PPO | Admitting: Certified Registered"

## 2020-10-31 ENCOUNTER — Encounter (HOSPITAL_COMMUNITY)
Admission: EM | Disposition: A | Discharge status: Skilled Nursing Facility | Payer: Self-pay | Source: Home / Self Care | Attending: Family Medicine

## 2020-10-31 ENCOUNTER — Encounter (HOSPITAL_COMMUNITY): Payer: Self-pay | Admitting: Internal Medicine

## 2020-10-31 DIAGNOSIS — C180 Malignant neoplasm of cecum: Secondary | ICD-10-CM | POA: Diagnosis not present

## 2020-10-31 DIAGNOSIS — D500 Iron deficiency anemia secondary to blood loss (chronic): Secondary | ICD-10-CM | POA: Diagnosis not present

## 2020-10-31 DIAGNOSIS — Z7901 Long term (current) use of anticoagulants: Secondary | ICD-10-CM | POA: Diagnosis not present

## 2020-10-31 DIAGNOSIS — I480 Paroxysmal atrial fibrillation: Secondary | ICD-10-CM | POA: Diagnosis not present

## 2020-10-31 DIAGNOSIS — D126 Benign neoplasm of colon, unspecified: Secondary | ICD-10-CM

## 2020-10-31 DIAGNOSIS — D509 Iron deficiency anemia, unspecified: Secondary | ICD-10-CM | POA: Diagnosis not present

## 2020-10-31 DIAGNOSIS — C189 Malignant neoplasm of colon, unspecified: Secondary | ICD-10-CM | POA: Diagnosis present

## 2020-10-31 DIAGNOSIS — K6389 Other specified diseases of intestine: Secondary | ICD-10-CM | POA: Diagnosis not present

## 2020-10-31 DIAGNOSIS — E7800 Pure hypercholesterolemia, unspecified: Secondary | ICD-10-CM | POA: Diagnosis not present

## 2020-10-31 HISTORY — PX: COLON RESECTION: SHX5231

## 2020-10-31 LAB — SURGICAL PCR SCREEN
MRSA, PCR: NEGATIVE
Staphylococcus aureus: NEGATIVE

## 2020-10-31 LAB — TYPE AND SCREEN
ABO/RH(D): O POS
Antibody Screen: NEGATIVE
Unit division: 0

## 2020-10-31 LAB — BPAM RBC
Blood Product Expiration Date: 202206082359
ISSUE DATE / TIME: 202205151523
Unit Type and Rh: 5100

## 2020-10-31 LAB — MAGNESIUM: Magnesium: 2.2 mg/dL (ref 1.7–2.4)

## 2020-10-31 LAB — HEMOGLOBIN AND HEMATOCRIT, BLOOD
HCT: 33.2 % — ABNORMAL LOW (ref 36.0–46.0)
Hemoglobin: 9.8 g/dL — ABNORMAL LOW (ref 12.0–15.0)

## 2020-10-31 LAB — SURGICAL PATHOLOGY

## 2020-10-31 LAB — PHOSPHORUS: Phosphorus: 3.1 mg/dL (ref 2.5–4.6)

## 2020-10-31 LAB — POTASSIUM: Potassium: 3.6 mmol/L (ref 3.5–5.1)

## 2020-10-31 SURGERY — LAPAROSCOPIC RIGHT COLON RESECTION
Anesthesia: General | Site: Abdomen

## 2020-10-31 MED ORDER — FENTANYL CITRATE (PF) 100 MCG/2ML IJ SOLN: 25.0000 ug | INTRAMUSCULAR | Status: DC | PRN

## 2020-10-31 MED ORDER — ONDANSETRON HCL 4 MG/2ML IJ SOLN
INTRAMUSCULAR | Status: AC
  Filled 2020-10-31: qty 2

## 2020-10-31 MED ORDER — DEXAMETHASONE SODIUM PHOSPHATE 10 MG/ML IJ SOLN
INTRAMUSCULAR | Status: DC | PRN
  Administered 2020-10-31: 8 mg via INTRAVENOUS

## 2020-10-31 MED ORDER — ALVIMOPAN 12 MG PO CAPS
12.0000 mg | ORAL_CAPSULE | Freq: Two times a day (BID) | ORAL | Status: DC
  Administered 2020-11-01: 12 mg via ORAL
  Filled 2020-10-31 (×3): qty 1

## 2020-10-31 MED ORDER — BUPIVACAINE LIPOSOME 1.3 % IJ SUSP
INTRAMUSCULAR | Status: DC | PRN
  Administered 2020-10-31: 20 mL

## 2020-10-31 MED ORDER — ROCURONIUM BROMIDE 10 MG/ML (PF) SYRINGE
PREFILLED_SYRINGE | INTRAVENOUS | Status: AC
  Filled 2020-10-31: qty 10

## 2020-10-31 MED ORDER — CALCIUM POLYCARBOPHIL 625 MG PO TABS
625.0000 mg | ORAL_TABLET | Freq: Two times a day (BID) | ORAL | Status: DC
  Administered 2020-10-31 – 2020-11-02 (×3): 625 mg via ORAL
  Filled 2020-10-31 (×4): qty 1

## 2020-10-31 MED ORDER — SODIUM CHLORIDE 0.9 % IV SOLN
2.0000 g | INTRAVENOUS | Status: AC
  Administered 2020-10-31: 2 g via INTRAVENOUS
  Filled 2020-10-31 (×2): qty 2

## 2020-10-31 MED ORDER — CHLORHEXIDINE GLUCONATE CLOTH 2 % EX PADS
6.0000 | MEDICATED_PAD | Freq: Every day | CUTANEOUS | Status: DC
  Administered 2020-10-31 – 2020-11-03 (×4): 6 via TOPICAL

## 2020-10-31 MED ORDER — MIDAZOLAM HCL 5 MG/5ML IJ SOLN
INTRAMUSCULAR | Status: DC | PRN
  Administered 2020-10-31: 1 mg via INTRAVENOUS

## 2020-10-31 MED ORDER — ENALAPRILAT 1.25 MG/ML IV SOLN
0.6250 mg | Freq: Four times a day (QID) | INTRAVENOUS | Status: DC | PRN
  Filled 2020-10-31: qty 1

## 2020-10-31 MED ORDER — LIP MEDEX EX OINT
TOPICAL_OINTMENT | CUTANEOUS | Status: AC
  Filled 2020-10-31: qty 7

## 2020-10-31 MED ORDER — PROCHLORPERAZINE MALEATE 10 MG PO TABS
10.0000 mg | ORAL_TABLET | Freq: Four times a day (QID) | ORAL | Status: DC | PRN
  Filled 2020-10-31 (×2): qty 1

## 2020-10-31 MED ORDER — ALUM & MAG HYDROXIDE-SIMETH 200-200-20 MG/5ML PO SUSP
30.0000 mL | Freq: Four times a day (QID) | ORAL | Status: DC | PRN
  Filled 2020-10-31: qty 30

## 2020-10-31 MED ORDER — ALBUMIN HUMAN 5 % IV SOLN: INTRAVENOUS | Status: DC | PRN

## 2020-10-31 MED ORDER — PROCHLORPERAZINE EDISYLATE 10 MG/2ML IJ SOLN
5.0000 mg | Freq: Four times a day (QID) | INTRAMUSCULAR | Status: DC | PRN
  Administered 2020-10-31: 5 mg via INTRAVENOUS
  Administered 2020-11-01 – 2020-11-02 (×3): 10 mg via INTRAVENOUS
  Administered 2020-11-09: 5 mg via INTRAVENOUS
  Administered 2020-11-10: 10 mg via INTRAVENOUS
  Filled 2020-10-31 (×6): qty 2

## 2020-10-31 MED ORDER — LIDOCAINE 2% (20 MG/ML) 5 ML SYRINGE
INTRAMUSCULAR | Status: DC | PRN
  Administered 2020-10-31: 50 mg via INTRAVENOUS

## 2020-10-31 MED ORDER — POTASSIUM CHLORIDE 2 MEQ/ML IV SOLN: INTRAVENOUS | Status: DC

## 2020-10-31 MED ORDER — ENSURE SURGERY PO LIQD
237.0000 mL | Freq: Two times a day (BID) | ORAL | Status: DC
  Filled 2020-10-31: qty 237

## 2020-10-31 MED ORDER — LACTATED RINGERS IV SOLN: INTRAVENOUS | Status: DC | PRN

## 2020-10-31 MED ORDER — PHENYLEPHRINE HCL-NACL 10-0.9 MG/250ML-% IV SOLN
INTRAVENOUS | Status: DC | PRN
  Administered 2020-10-31: 40 ug/min via INTRAVENOUS

## 2020-10-31 MED ORDER — BUPIVACAINE-EPINEPHRINE 0.25% -1:200000 IJ SOLN
INTRAMUSCULAR | Status: DC | PRN
  Administered 2020-10-31: 50 mL

## 2020-10-31 MED ORDER — PROPOFOL 10 MG/ML IV BOLUS
INTRAVENOUS | Status: DC | PRN
  Administered 2020-10-31 (×2): 50 mg via INTRAVENOUS
  Administered 2020-10-31: 100 mg via INTRAVENOUS

## 2020-10-31 MED ORDER — KCL IN DEXTROSE-NACL 20-5-0.45 MEQ/L-%-% IV SOLN
INTRAVENOUS | Status: DC
  Filled 2020-10-31 (×2): qty 1000

## 2020-10-31 MED ORDER — BUPIVACAINE LIPOSOME 1.3 % IJ SUSP
20.0000 mL | Freq: Once | INTRAMUSCULAR | Status: DC
  Filled 2020-10-31 (×2): qty 20

## 2020-10-31 MED ORDER — SIMETHICONE 80 MG PO CHEW
40.0000 mg | CHEWABLE_TABLET | Freq: Four times a day (QID) | ORAL | Status: DC | PRN
  Administered 2020-10-31 – 2020-11-01 (×2): 40 mg via ORAL
  Filled 2020-10-31 (×2): qty 1

## 2020-10-31 MED ORDER — ROCURONIUM BROMIDE 10 MG/ML (PF) SYRINGE
PREFILLED_SYRINGE | INTRAVENOUS | Status: AC
  Filled 2020-10-31: qty 20

## 2020-10-31 MED ORDER — LIDOCAINE 2% (20 MG/ML) 5 ML SYRINGE
INTRAMUSCULAR | Status: AC
  Filled 2020-10-31: qty 5

## 2020-10-31 MED ORDER — GABAPENTIN 300 MG PO CAPS
300.0000 mg | ORAL_CAPSULE | ORAL | Status: AC
  Administered 2020-10-31: 300 mg via ORAL
  Filled 2020-10-31: qty 1

## 2020-10-31 MED ORDER — BUPIVACAINE-EPINEPHRINE 0.25% -1:200000 IJ SOLN
INTRAMUSCULAR | Status: AC
  Filled 2020-10-31: qty 1

## 2020-10-31 MED ORDER — PHENYLEPHRINE HCL (PRESSORS) 10 MG/ML IV SOLN
INTRAVENOUS | Status: AC
  Filled 2020-10-31: qty 1

## 2020-10-31 MED ORDER — FENTANYL CITRATE (PF) 250 MCG/5ML IJ SOLN
INTRAMUSCULAR | Status: AC
  Filled 2020-10-31: qty 5

## 2020-10-31 MED ORDER — METHOCARBAMOL 500 MG PO TABS: 1000.0000 mg | ORAL_TABLET | Freq: Four times a day (QID) | ORAL | Status: DC | PRN

## 2020-10-31 MED ORDER — CHLORHEXIDINE GLUCONATE CLOTH 2 % EX PADS
6.0000 | MEDICATED_PAD | Freq: Once | CUTANEOUS | Status: DC
  Administered 2020-10-31: 6 via TOPICAL

## 2020-10-31 MED ORDER — LACTATED RINGERS IV SOLN: INTRAVENOUS | Status: DC

## 2020-10-31 MED ORDER — DEXAMETHASONE SODIUM PHOSPHATE 10 MG/ML IJ SOLN
INTRAMUSCULAR | Status: AC
  Filled 2020-10-31: qty 1

## 2020-10-31 MED ORDER — ACETAMINOPHEN 500 MG PO TABS
1000.0000 mg | ORAL_TABLET | ORAL | Status: AC
  Administered 2020-10-31: 1000 mg via ORAL
  Filled 2020-10-31: qty 2

## 2020-10-31 MED ORDER — SUGAMMADEX SODIUM 200 MG/2ML IV SOLN
INTRAVENOUS | Status: DC | PRN
  Administered 2020-10-31: 130 mg via INTRAVENOUS

## 2020-10-31 MED ORDER — ENOXAPARIN SODIUM 40 MG/0.4ML IJ SOSY
40.0000 mg | PREFILLED_SYRINGE | INTRAMUSCULAR | Status: AC
  Administered 2020-10-31: 40 mg via SUBCUTANEOUS
  Filled 2020-10-31: qty 0.4

## 2020-10-31 MED ORDER — PROPOFOL 10 MG/ML IV BOLUS
INTRAVENOUS | Status: AC
  Filled 2020-10-31: qty 20

## 2020-10-31 MED ORDER — MELATONIN 3 MG PO TABS: 3.0000 mg | ORAL_TABLET | Freq: Every evening | ORAL | Status: DC | PRN

## 2020-10-31 MED ORDER — ALVIMOPAN 12 MG PO CAPS
12.0000 mg | ORAL_CAPSULE | ORAL | Status: AC
  Administered 2020-10-31: 12 mg via ORAL
  Filled 2020-10-31 (×2): qty 1

## 2020-10-31 MED ORDER — ACETAMINOPHEN 500 MG PO TABS
1000.0000 mg | ORAL_TABLET | Freq: Four times a day (QID) | ORAL | Status: DC
  Administered 2020-10-31 – 2020-11-01 (×4): 1000 mg via ORAL
  Filled 2020-10-31 (×5): qty 2

## 2020-10-31 MED ORDER — SODIUM CHLORIDE 0.9 % IV SOLN: Freq: Three times a day (TID) | INTRAVENOUS | Status: DC | PRN

## 2020-10-31 MED ORDER — POTASSIUM CHLORIDE CRYS ER 20 MEQ PO TBCR
40.0000 meq | EXTENDED_RELEASE_TABLET | Freq: Once | ORAL | Status: AC
  Administered 2020-10-31: 40 meq via ORAL
  Filled 2020-10-31: qty 2

## 2020-10-31 MED ORDER — SODIUM CHLORIDE 0.9 % IV SOLN
2.0000 g | Freq: Two times a day (BID) | INTRAVENOUS | Status: AC
  Administered 2020-10-31: 2 g via INTRAVENOUS
  Filled 2020-10-31: qty 2

## 2020-10-31 MED ORDER — MIDAZOLAM HCL 2 MG/2ML IJ SOLN
INTRAMUSCULAR | Status: AC
  Filled 2020-10-31: qty 2

## 2020-10-31 MED ORDER — ONDANSETRON HCL 4 MG/2ML IJ SOLN
INTRAMUSCULAR | Status: DC | PRN
  Administered 2020-10-31 (×2): 4 mg via INTRAVENOUS

## 2020-10-31 MED ORDER — ROCURONIUM BROMIDE 100 MG/10ML IV SOLN
INTRAVENOUS | Status: DC | PRN
  Administered 2020-10-31 (×2): 20 mg via INTRAVENOUS
  Administered 2020-10-31: 60 mg via INTRAVENOUS
  Administered 2020-10-31: 10 mg via INTRAVENOUS

## 2020-10-31 MED ORDER — HYDROMORPHONE HCL 1 MG/ML IJ SOLN
0.5000 mg | INTRAMUSCULAR | Status: DC | PRN
  Administered 2020-10-31: 0.5 mg via INTRAVENOUS
  Filled 2020-10-31: qty 1

## 2020-10-31 MED ORDER — ALBUMIN HUMAN 5 % IV SOLN
INTRAVENOUS | Status: AC
  Filled 2020-10-31: qty 250

## 2020-10-31 MED ORDER — FENTANYL CITRATE (PF) 100 MCG/2ML IJ SOLN
INTRAMUSCULAR | Status: DC | PRN
  Administered 2020-10-31: 50 ug via INTRAVENOUS
  Administered 2020-10-31: 25 ug via INTRAVENOUS
  Administered 2020-10-31 (×2): 50 ug via INTRAVENOUS
  Administered 2020-10-31: 25 ug via INTRAVENOUS
  Administered 2020-10-31: 50 ug via INTRAVENOUS

## 2020-10-31 MED ORDER — TRAMADOL HCL 50 MG PO TABS: 50.0000 mg | ORAL_TABLET | Freq: Four times a day (QID) | ORAL | Status: DC | PRN

## 2020-10-31 MED ORDER — METHOCARBAMOL 1000 MG/10ML IJ SOLN
1000.0000 mg | Freq: Four times a day (QID) | INTRAVENOUS | Status: DC | PRN
  Filled 2020-10-31: qty 10

## 2020-10-31 MED ORDER — ENOXAPARIN SODIUM 40 MG/0.4ML IJ SOSY
40.0000 mg | PREFILLED_SYRINGE | INTRAMUSCULAR | Status: DC
  Administered 2020-11-01 – 2020-11-05 (×5): 40 mg via SUBCUTANEOUS
  Filled 2020-10-31 (×5): qty 0.4

## 2020-10-31 SURGICAL SUPPLY — 78 items
APL PRP STRL LF DISP 70% ISPRP (MISCELLANEOUS) ×2
APPLIER CLIP 5 13 M/L LIGAMAX5 (MISCELLANEOUS)
APPLIER CLIP ROT 10 11.4 M/L (STAPLE)
APR CLP MED LRG 11.4X10 (STAPLE)
APR CLP MED LRG 5 ANG JAW (MISCELLANEOUS)
CABLE HIGH FREQUENCY MONO STRZ (ELECTRODE) ×4 IMPLANT
CELLS DAT CNTRL 66122 CELL SVR (MISCELLANEOUS) IMPLANT
CHLORAPREP W/TINT 26 (MISCELLANEOUS) ×4 IMPLANT
CLIP APPLIE 5 13 M/L LIGAMAX5 (MISCELLANEOUS) IMPLANT
CLIP APPLIE ROT 10 11.4 M/L (STAPLE) IMPLANT
COUNTER NEEDLE 20 DBL MAG RED (NEEDLE) ×4 IMPLANT
COVER MAYO STAND STRL (DRAPES) ×12 IMPLANT
COVER SURGICAL LIGHT HANDLE (MISCELLANEOUS) ×4 IMPLANT
COVER WAND RF STERILE (DRAPES) IMPLANT
DECANTER SPIKE VIAL GLASS SM (MISCELLANEOUS) ×4 IMPLANT
DRAIN CHANNEL 19F RND (DRAIN) IMPLANT
DRAPE LAPAROSCOPIC ABDOMINAL (DRAPES) ×4 IMPLANT
DRAPE SURG IRRIG POUCH 19X23 (DRAPES) ×4 IMPLANT
DRSG OPSITE POSTOP 4X10 (GAUZE/BANDAGES/DRESSINGS) IMPLANT
DRSG OPSITE POSTOP 4X6 (GAUZE/BANDAGES/DRESSINGS) IMPLANT
DRSG OPSITE POSTOP 4X8 (GAUZE/BANDAGES/DRESSINGS) IMPLANT
DRSG TEGADERM 2-3/8X2-3/4 SM (GAUZE/BANDAGES/DRESSINGS) ×8 IMPLANT
DRSG TEGADERM 4X4.75 (GAUZE/BANDAGES/DRESSINGS) IMPLANT
ELECT REM PT RETURN 15FT ADLT (MISCELLANEOUS) ×4 IMPLANT
ENDOLOOP SUT PDS II  0 18 (SUTURE)
ENDOLOOP SUT PDS II 0 18 (SUTURE) IMPLANT
EVACUATOR SILICONE 100CC (DRAIN) IMPLANT
GAUZE SPONGE 2X2 8PLY STRL LF (GAUZE/BANDAGES/DRESSINGS) ×2 IMPLANT
GAUZE SPONGE 4X4 12PLY STRL (GAUZE/BANDAGES/DRESSINGS) ×4 IMPLANT
GLOVE SURG LTX SZ8 (GLOVE) ×8 IMPLANT
GLOVE SURG UNDER LTX SZ8 (GLOVE) ×8 IMPLANT
GOWN STRL REUS W/TWL XL LVL3 (GOWN DISPOSABLE) ×16 IMPLANT
IRRIG SUCT STRYKERFLOW 2 WTIP (MISCELLANEOUS) ×4
IRRIGATION SUCT STRKRFLW 2 WTP (MISCELLANEOUS) ×2 IMPLANT
KIT TURNOVER KIT A (KITS) ×4 IMPLANT
LEGGING LITHOTOMY PAIR STRL (DRAPES) IMPLANT
NDL INSUFFLATION 14GA 120MM (NEEDLE) IMPLANT
NEEDLE INSUFFLATION 14GA 120MM (NEEDLE) ×4 IMPLANT
PACK COLON (CUSTOM PROCEDURE TRAY) ×4 IMPLANT
PAD POSITIONING PINK XL (MISCELLANEOUS) ×4 IMPLANT
PENCIL SMOKE EVACUATOR (MISCELLANEOUS) IMPLANT
PORT LAP GEL ALEXIS MED 5-9CM (MISCELLANEOUS) ×3 IMPLANT
PROTECTOR NERVE ULNAR (MISCELLANEOUS) IMPLANT
RETRACTOR WND ALEXIS 18 MED (MISCELLANEOUS) IMPLANT
RTRCTR WOUND ALEXIS 18CM MED (MISCELLANEOUS)
SCISSORS LAP 5X35 DISP (ENDOMECHANICALS) ×4 IMPLANT
SEALER TISSUE G2 STRG ARTC 35C (ENDOMECHANICALS) IMPLANT
SET TUBE SMOKE EVAC HIGH FLOW (TUBING) ×4 IMPLANT
SLEEVE ADV FIXATION 5X100MM (TROCAR) IMPLANT
SLEEVE XCEL OPT CAN 5 100 (ENDOMECHANICALS) IMPLANT
SPONGE GAUZE 2X2 STER 10/PKG (GAUZE/BANDAGES/DRESSINGS) ×2
SPONGE LAP 18X18 RF (DISPOSABLE) IMPLANT
STAPLER VISISTAT 35W (STAPLE) IMPLANT
SURGILUBE 2OZ TUBE FLIPTOP (MISCELLANEOUS) IMPLANT
SUT MNCRL AB 4-0 PS2 18 (SUTURE) ×7 IMPLANT
SUT PDS AB 1 CTX 36 (SUTURE) ×8 IMPLANT
SUT PDS AB 1 TP1 96 (SUTURE) IMPLANT
SUT PROLENE 0 CT 2 (SUTURE) IMPLANT
SUT PROLENE 2 0 SH DA (SUTURE) IMPLANT
SUT SILK 2 0 (SUTURE) ×4
SUT SILK 2 0 SH CR/8 (SUTURE) ×4 IMPLANT
SUT SILK 2-0 18XBRD TIE 12 (SUTURE) ×2 IMPLANT
SUT SILK 3 0 (SUTURE) ×4
SUT SILK 3 0 SH CR/8 (SUTURE) ×4 IMPLANT
SUT SILK 3-0 18XBRD TIE 12 (SUTURE) ×2 IMPLANT
SUT VICRYL 0 UR6 27IN ABS (SUTURE) ×4 IMPLANT
SUT VLOC 180 2-0 6IN GS21 (SUTURE) ×6 IMPLANT
SYS LAPSCP GELPORT 120MM (MISCELLANEOUS)
SYSTEM LAPSCP GELPORT 120MM (MISCELLANEOUS) IMPLANT
TAPE UMBILICAL 1/8 X36 TWILL (MISCELLANEOUS) ×4 IMPLANT
TOWEL OR 17X26 10 PK STRL BLUE (TOWEL DISPOSABLE) IMPLANT
TOWEL OR NON WOVEN STRL DISP B (DISPOSABLE) ×4 IMPLANT
TRAY FOLEY MTR SLVR 16FR STAT (SET/KITS/TRAYS/PACK) IMPLANT
TROCAR ADV FIXATION 5X100MM (TROCAR) IMPLANT
TROCAR BLADELESS OPT 5 100 (ENDOMECHANICALS) ×4 IMPLANT
TROCAR XCEL NON-BLD 11X100MML (ENDOMECHANICALS) IMPLANT
TUBING CONNECTING 10 (TUBING) IMPLANT
TUBING CONNECTING 10' (TUBING)

## 2020-10-31 NOTE — Interval H&P Note (Signed)
History and Physical Interval Note:  10/31/2020 9:31 AM  Robin [name]  has presented today for surgery, with the diagnosis of ANEMIA/COLON CA.  The various methods of treatment have been discussed with the patient and family. After consideration of risks, benefits and other options for treatment, the patient has consented to  Procedure(s): LAPAROSCOPIC COLON RESECTION (N/A) as a surgical intervention.  The patient's history has been reviewed, patient examined, no change in status, stable for surgery.  I have reviewed the patient's chart and labs.  Questions were answered to the patient & her daughter's satisfaction.    I have re-reviewed the the patient's records, history, medications, and allergies.  I have re-examined the patient.  I again discussed intraoperative plans and goals of post-operative recovery.  The patient agrees to proceed.  Robin [name]  11-06-1934 [id number]  Patient Care Team: [name], PA-C as PCP - General (Family Medicine) Skeet Latch, MD as PCP - Cardiology (Cardiology) Milus Banister, MD as Attending Physician (Gastroenterology)  Patient Active Problem List   Diagnosis Date Noted   Primary cancer of cecum [facility]) 10/31/2020    Priority: High   High grade dysplasia in colonic adenomas transverse colon & splenic flexure 10/31/2020   Colonic mass    GI bleed 10/27/2020   Anemia 10/26/2020   Chronic anticoagulation 02/12/2019   Venous insufficiency (chronic) (peripheral) 02/12/2019   Normal coronary arteries 02/12/2019   Carotid stenosis 03/02/2017   Paroxysmal atrial fibrillation ([place]) 08/05/2015   First degree heart block by electrocardiogram 06/03/2014   Hypokalemia 03/02/2014   PAC (premature atrial contraction) 05/05/2012   Hypercholesterolemia 06/13/2011   Benign hypertensive heart disease without heart failure 12/11/2010    Past Medical History:  Diagnosis Date   Anxiety    AV bloc first degree    Carotid stenosis  03/02/2017   Hyperlipidemia    Hypertension    Lumbar pain    fell off of her horse and has low back pain at times   Venous insufficiency of both lower extremities    Vertigo     Past Surgical History:  Procedure Laterality Date   ABDOMINAL HYSTERECTOMY     BREAST BIOPSY Left    BREAST EXCISIONAL BIOPSY Right    BREAST LUMPECTOMY WITH RADIOACTIVE SEED LOCALIZATION Left 05/05/2015   Procedure: LEFT BREAST LUMPECTOMY WITH RADIOACTIVE SEED LOCALIZATION;  Surgeon: Autumn Messing III, MD;  Location: [place];  Service: General;  Laterality: Left;   CHOLECYSTECTOMY     EYE SURGERY     bil cataract surgery   LUNG SURGERY      Social History   Socioeconomic History   Marital status: Married    Spouse name: Not on file   Number of children: Not on file   Years of education: Not on file   Highest education level: Not on file  Occupational History   Not on file  Tobacco Use   Smoking status: Never Smoker   Smokeless tobacco: Never Used  Vaping Use   Vaping Use: Never used  Substance and Sexual Activity   Alcohol use: Yes    Comment: social   Drug use: No   Sexual activity: Not on file  Other Topics Concern   Not on file  Social History Narrative   Not on file   Social Determinants of Health   Financial Resource Strain: Not on file  Food Insecurity: Not on file  Transportation Needs: Not on file  Physical Activity: Not on file  Stress:  Not on file  Social Connections: Not on file  Intimate Partner Violence: Not on file    Family History  Problem Relation Age of Onset   Hypertension Mother    Stroke Mother    Heart disease Mother    Heart disease Father    Hypertension Sister    Cancer Sister    Hypertension Brother    Hypertension Sister    Hypertension Sister    Breast cancer Neg Hx     Medications Prior to Admission  Medication Sig Dispense Refill Last Dose   atorvastatin (LIPITOR) 10 MG tablet Take 0.5 tablets by mouth 3 (three) times a  week.   10/24/2020 at unknown time   Cholecalciferol (VITAMIN D) 50 MCG (2000 UT) tablet Take 2,000 Units by mouth at bedtime.   10/25/2020 at Unknown time   diphenoxylate-atropine (LOMOTIL) 2.5-0.025 MG per tablet Take 1 tablet by mouth daily as needed (for diarrhea).    10/26/2020 at Unknown time   ELIQUIS 5 MG TABS tablet TAKE 1 TABLET BY MOUTH TWICE DAILY. 180 tablet 1 10/26/2020 at 0800   estradiol (ESTRACE) 1 MG tablet Take 0.5 mg by mouth daily.   10/26/2020 at Unknown time   Lactobacillus Rhamnosus, GG, (CULTURELLE PO) Take 1 capsule by mouth every morning.   10/26/2020 at Unknown time   Lutein 20 MG CAPS Take 1 capsule by mouth every evening.   10/25/2020 at Unknown time   metoprolol succinate (TOPROL-XL) 50 MG 24 hr tablet Take 50 mg by mouth 2 (two) times daily.   10/26/2020 at 0800   potassium chloride SA (KLOR-CON) 20 MEQ tablet TAKE 1 TABLET BY MOUTH TWICE DAILY. (Patient taking differently: Take 20 mEq by mouth daily.) 180 tablet 2 10/26/2020 at Unknown time   TIADYLT ER 180 MG 24 hr capsule TAKE (1) CAPSULE DAILY. (Patient taking differently: Take 180 mg by mouth daily.) 90 capsule 2 10/26/2020 at Unknown time   tiZANidine (ZANAFLEX) 2 MG tablet Take 2 mg by mouth every 8 (eight) hours as needed for muscle spasms.   unknown   triamcinolone cream (KENALOG) 0.1 % Apply 1 application topically daily as needed (for rash).    unknown   triamterene-hydrochlorothiazide (MAXZIDE-25) 37.5-25 MG tablet Take 1 tablet by mouth daily.   10/26/2020 at Unknown time   vitamin A 10000 UNIT capsule Take 10,000 Units by mouth daily.   10/26/2020 at Unknown time    Current Facility-Administered Medications  Medication Dose Route Frequency Provider Last Rate Last Admin   acetaminophen (TYLENOL) tablet 650 mg  650 mg Oral Q6H PRN Milus Banister, MD       Or   acetaminophen (TYLENOL) suppository 650 mg  650 mg Rectal Q6H PRN Milus Banister, MD       acetaminophen (TYLENOL) tablet 1,000 mg  1,000 mg Oral On Call  to OR Saverio Danker, PA-C       alvimopan (ENTEREG) capsule 12 mg  12 mg Oral On Call to OR Saverio Danker, PA-C       atorvastatin (LIPITOR) tablet 5 mg  5 mg Oral Once per day on Mon Wed Fri Milus Banister, MD       bupivacaine liposome (EXPAREL) 1.3 % injection 266 mg  20 mL Infiltration Once Saverio Danker, PA-C       cefoTEtan (CEFOTAN) 2 g in sodium chloride 0.9 % 100 mL IVPB  2 g Intravenous On Call to OR Saverio Danker, PA-C       Chlorhexidine Gluconate Cloth  2 % PADS 6 each  6 each Topical Once Saverio Danker, PA-C       cholecalciferol (VITAMIN D) tablet 2,000 Units  2,000 Units Oral QHS Milus Banister, MD   2,000 Units at 10/30/20 2156   dextrose 5 % and 0.45 % NaCl with KCl 20 mEq/L infusion   Intravenous Continuous Polly Cobia, RPH       diltiazem (CARDIZEM CD) 24 hr capsule 180 mg  180 mg Oral Daily Milus Banister, MD   180 mg at 10/30/20 1407   diphenoxylate-atropine (LOMOTIL) 2.5-0.025 MG per tablet 1 tablet  1 tablet Oral Daily PRN Milus Banister, MD       enoxaparin (LOVENOX) injection 40 mg  40 mg Subcutaneous On Call to OR Saverio Danker, PA-C       estradiol (ESTRACE) tablet 0.5 mg  0.5 mg Oral Daily Milus Banister, MD   0.5 mg at 10/30/20 0946   feeding supplement (BOOST / RESOURCE BREEZE) liquid 1 Container  1 Container Oral BID BM Gonfa, Taye T, MD       feeding supplement (ENSURE ENLIVE / ENSURE PLUS) liquid 237 mL  237 mL Oral BID BM Gonfa, Taye T, MD       ferrous sulfate tablet 325 mg  325 mg Oral BID WC Milus Banister, MD   325 mg at 10/30/20 1717   gabapentin (NEURONTIN) capsule 300 mg  300 mg Oral On Call to OR Saverio Danker, PA-C       LORazepam (ATIVAN) tablet 0.25 mg  0.25 mg Oral Q12H PRN Dahal, Marlowe Aschoff, MD   0.25 mg at 10/29/20 1008   metoprolol succinate (TOPROL-XL) 24 hr tablet 50 mg  50 mg Oral BID Milus Banister, MD   50 mg at 10/30/20 2156   metoprolol tartrate (LOPRESSOR) injection 2.5 mg  2.5 mg Intravenous Q4H PRN Milus Banister, MD        multivitamin (PROSIGHT) tablet 1 tablet  1 tablet Oral Daily Milus Banister, MD   1 tablet at 10/30/20 0946   ondansetron (ZOFRAN) tablet 4 mg  4 mg Oral Q6H PRN Milus Banister, MD       Or   ondansetron [facility]) injection 4 mg  4 mg Intravenous Q6H PRN Milus Banister, MD       potassium chloride SA (KLOR-CON) CR tablet 20 mEq  20 mEq Oral Daily Milus Banister, MD   20 mEq at 10/29/20 8588   potassium chloride SA (KLOR-CON) CR tablet 40 mEq  40 mEq Oral Once Mercy Riding, MD         Allergies  Allergen Reactions   Iodinated Diagnostic Agents Shortness Of Breath   Amlodipine     Shortness of breath   Codeine Nausea Only   Diphenhydramine Other (See Comments)    hypertension   Diphenhydramine Hcl     Hypertension    Pravastatin     Muscle aches   Zetia [Ezetimibe]    Sulfa Antibiotics Rash   Sulfa Drugs Cross Reactors Rash    BP 134/77   Pulse (!) 103   Temp (!) 97.4 F (36.3 C) (Oral)   Resp 18   Ht 5\' 4"  (1.626 m)   Wt 66 kg   SpO2 97%   BMI 24.98 kg/m   Labs: Results for orders placed or performed during the hospital encounter of 10/26/20 (from the past 48 hour(s))  Renal function panel     Status: Abnormal  Collection Time: 10/30/20  5:25 AM  Result Value Ref Range   Sodium 136 135 - 145 mmol/L   Potassium 4.3 3.5 - 5.1 mmol/L    Comment: DELTA CHECK NOTED NO VISIBLE HEMOLYSIS    Chloride 104 98 - 111 mmol/L   CO2 27 22 - 32 mmol/L   Glucose, Bld 102 (H) 70 - 99 mg/dL    Comment: Glucose reference range applies only to samples taken after fasting for at least 8 hours.   BUN 11 8 - 23 mg/dL   Creatinine, Ser 0.60 0.44 - 1.00 mg/dL   Calcium 9.9 8.9 - 10.3 mg/dL   Phosphorus 2.4 (L) 2.5 - 4.6 mg/dL   Albumin 3.3 (L) 3.5 - 5.0 g/dL   GFR, Estimated >60 >60 mL/min    Comment: (NOTE) Calculated using the CKD-EPI Creatinine Equation (2021)    Anion gap 5 5 - 15    Comment: Performed at [facility], [place] [address],  [place], [place] [address]  CBC     Status: Abnormal   Collection Time: 10/30/20  5:25 AM  Result Value Ref Range   WBC 8.6 4.0 - 10.5 K/uL   RBC 3.90 3.87 - 5.11 MIL/uL   Hemoglobin 8.4 (L) 12.0 - 15.0 g/dL    Comment: Reticulocyte Hemoglobin testing may be clinically indicated, consider ordering this additional test [id number]    HCT 29.7 (L) 36.0 - 46.0 %   MCV 76.2 (L) 80.0 - 100.0 fL   MCH 21.5 (L) 26.0 - 34.0 pg   MCHC 28.3 (L) 30.0 - 36.0 g/dL   RDW 15.9 (H) 11.5 - 15.5 %   Platelets 344 150 - 400 K/uL   nRBC 0.4 (H) 0.0 - 0.2 %    Comment: Performed at [facility], [place] [address], [place], [place] [address]  Magnesium     Status: None   Collection Time: 10/30/20  5:25 AM  Result Value Ref Range   Magnesium 2.0 1.7 - 2.4 mg/dL    Comment: Performed at [facility], [place] [address] [place], [place] [address]  Prepare RBC (crossmatch)     Status: None   Collection Time: 10/30/20  7:36 AM  Result Value Ref Range   Order Confirmation      ORDER PROCESSED BY BLOOD BANK Performed at [facility], [place] [address], [place], [place] [address]   Type and screen [place]     Status: None   Collection Time: 10/30/20 10:03 AM  Result Value Ref Range   ABO/RH(D) O POS    Antibody Screen NEG    Sample Expiration 11/02/2020,2359    Unit Number [id number]    Blood Component Type RED CELLS,LR    Unit division 00    Status of Unit ISSUED,FINAL    Transfusion Status OK TO TRANSFUSE    Crossmatch Result      Compatible Performed at [facility], [place] [address] [place], [place] [address]   Hemoglobin and hematocrit, blood     Status: Abnormal   Collection Time: 10/31/20  5:25 AM  Result Value Ref Range   Hemoglobin 9.8 (L) 12.0 - 15.0 g/dL   HCT 33.2 (L) 36.0 - 46.0 %    Comment: Performed at [facility], [place] [address] [place], [place] [address]  Magnesium      Status: None   Collection Time: 10/31/20  5:25 AM  Result Value Ref Range   Magnesium 2.2  1.7 - 2.4 mg/dL    Comment: Performed at [facility], [place] [address] [place], [place] [address]  Potassium     Status: None   Collection Time: 10/31/20  5:25 AM  Result Value Ref Range   Potassium 3.6 3.5 - 5.1 mmol/L    Comment: Performed at [facility], [place] [address] [place], [place] [address]  Phosphorus     Status: None   Collection Time: 10/31/20  5:25 AM  Result Value Ref Range   Phosphorus 3.1 2.5 - 4.6 mg/dL    Comment: Performed at [facility], [place] [address], [place], [place] [address]    Imaging / Studies: DG Chest 2 View  Result Date: 10/26/2020 CLINICAL DATA:  Shortness of breath.  Fatigue. EXAM: CHEST - 2 VIEW COMPARISON:  Radiograph 08/19/2015.  CT 12/16/2017 FINDINGS: Upper normal heart size with normal mediastinal contours. No pulmonary edema, pleural effusion, or pneumothorax. No focal airspace disease. Mild biapical pleuroparenchymal scarring. Pulmonary nodules on prior CT are not well seen by radiograph. Chronic lower thoracic compression fracture, unchanged. IMPRESSION: No acute chest findings. Electronically Signed   By: Keith Rake M.D.   On: 10/26/2020 19:43   MR LIVER W WO CONTRAST  Result Date: 10/29/2020 CLINICAL DATA:  Colorectal cancer staging EXAM: MRI ABDOMEN WITHOUT AND WITH CONTRAST TECHNIQUE: Multiplanar multisequence MR imaging of the abdomen was performed both before and after the administration of intravenous contrast. CONTRAST:  76mL GADAVIST GADOBUTROL 1 MMOL/ML IV SOLN COMPARISON:  10/28/2020 FINDINGS: Examination is generally somewhat limited by breath motion artifact throughout. Lower chest: No acute findings. Hepatobiliary: Hepatic iron deposition. No mass or other parenchymal abnormality identified. Status post cholecystectomy. No biliary ductal dilatation. Pancreas: No mass, inflammatory  changes, or other parenchymal abnormality identified. Spleen:  Splenic iron deposition. Adrenals/Urinary Tract: No masses identified. Large, benign exophytic cyst arising from the inferior pole of the right kidney measuring at least 11.0 cm (series 5, image 29). Multiple additional small cortical and parapelvic cysts. No hydronephrosis. Stomach/Bowel: Visualized portions within the abdomen are unremarkable. Vascular/Lymphatic: No pathologically enlarged lymph nodes identified. No abdominal aortic aneurysm demonstrated. Aortic atherosclerosis. Other:  None. Musculoskeletal: No suspicious bone lesions identified. Bone marrow iron deposition. IMPRESSION: 1. Examination is generally somewhat limited by breath motion artifact. Within this limitation, no evidence of lymphadenopathy or metastatic disease in the abdomen. 2. Hepatic, splenic, and bone marrow iron deposition. Correlate for history of transfusion or other etiology of iron overload. 3. Status post cholecystectomy. Electronically Signed   By: Eddie Candle M.D.   On: 10/29/2020 13:11   CT CHEST ABDOMEN PELVIS WO CONTRAST  Result Date: 10/29/2020 CLINICAL DATA:  Colorectal carcinoma.  Staging. EXAM: CT CHEST, ABDOMEN AND PELVIS WITHOUT CONTRAST TECHNIQUE: Multidetector CT imaging of the chest, abdomen and pelvis was performed following the standard protocol without IV contrast. COMPARISON:  CT chest 12/16/2017 and CT AP 03/08/2014 FINDINGS: CT CHEST FINDINGS Cardiovascular: The heart size appears within normal limits. Aortic atherosclerosis. Coronary artery calcifications. No pericardial effusion. Mediastinum/Nodes: No enlarged mediastinal, hilar, or axillary lymph nodes. Thyroid gland, trachea, and esophagus demonstrate no significant findings. Lungs/Pleura: No pleural effusion, airspace consolidation, or pneumothorax. Again seen are chronic parenchymal changes likely due to indolent atypical infection such as MAI. This includes bilateral areas scarring,  peripheral tree-in-bud nodularity architectural distortion, cylindrical bronchiectasis and volume loss. This predominantly affects the right middle lobe and lingula as well as the anterior right lower lobe. Imaging findings appears similar to the previous  exam. Scattered solid round nodules are identified, which, although nonspecific, are likely the sequelae of chronic indolent infection and appears similar to previous exam. These include: -4 mm peripheral nodule in the right lower lobe, image 95/4. This is unchanged from previous exam. Two adjacent solid-appearing nodules are also unchanged from the previous exam, image 93/4. -within the anterolateral left lower lobe there is a nodule measuring 4 mm, image 119/4. Unchanged from previous exam. No new suspicious lung nodules identified. Musculoskeletal: No chest wall mass or suspicious bone lesions identified. There is remote superior endplate deformity involving T11 and T12. Unchanged from previous exam. CT ABDOMEN PELVIS FINDINGS Hepatobiliary: Within the limitations of unenhanced technique there is no focal liver abnormality. Previous cholecystectomy. No biliary dilatation. Pancreas: Unremarkable. No pancreatic ductal dilatation or surrounding inflammatory changes. Spleen: Normal in size without focal abnormality. Adrenals/Urinary Tract: Normal appearance of the adrenal glands. Large right kidney cyst measures 10.5 cm. No hydronephrosis or nephrolithiasis identified bilaterally. Urinary bladder appears normal for degree of distension. Stomach/Bowel: Stomach is within normal limits. The appendix is difficult to visualize within its entirety separate from right lower quadrant bowel loops. No secondary signs of acute appendicitis however no bowel wall thickening, inflammation, or distension. The soft tissue lesion near the ileocecal valve is identified and measures approximately 2.9 cm, image 101/2 and image 31/5. Multiple small ileocolic lymph nodes are identified  which measure up to 7 mm. Vascular/Lymphatic: Aortic atherosclerosis. No enlarged abdominal or pelvic lymph nodes. Reproductive: Status post hysterectomy. No adnexal masses. Other: No free fluid or fluid collections. Musculoskeletal: Degenerative disc disease identified within the thoracolumbar spine. IMPRESSION: 1. Masslike soft tissue lesion near the ileocecal valve is identified compatible with primary colonic neoplasm. Small ileocolic lymph nodes are identified which measure up to 7 mm. No findings of solid organ metastasis or nodal metastasis within the chest, abdomen or pelvis. 2. Chronic parenchymal changes within both lungs likely due to indolent atypical infection such as MAI. 3. Aortic atherosclerosis and coronary artery calcifications. Aortic Atherosclerosis (ICD10-I70.0). Electronically Signed   By: Kerby Moors M.D.   On: 10/29/2020 06:45     .Adin Hector, M.D., F.A.C.S. Gastrointestinal and Minimally Invasive Surgery [facility] [address], [place], [place] [address] [phone] Main / Paging  10/31/2020 9:32 AM     Adin Hector

## 2020-10-31 NOTE — Discharge Instructions (Addendum)
SURGERY: POST OP INSTRUCTIONS (Surgery for small bowel obstruction, colon resection, etc)   ######################################################################  EAT Gradually transition to a high fiber diet with a fiber supplement over the next few days after discharge  WALK Walk an hour a day.  Control your pain to do that.    CONTROL PAIN Control pain so that you can walk, sleep, tolerate sneezing/coughing, go up/down stairs.  HAVE A BOWEL MOVEMENT DAILY Keep your bowels regular to avoid problems.  OK to try a laxative to override constipation.  OK to use an antidairrheal to slow down diarrhea.  Call if not better after 2 tries  CALL IF YOU HAVE PROBLEMS/CONCERNS Call if you are still struggling despite following these instructions. Call if you have concerns not answered by these instructions  ######################################################################   DIET Follow a light diet the first few days at home.  Start with a bland diet such as soups, liquids, starchy foods, low fat foods, etc.  If you feel full, bloated, or constipated, stay on a ful liquid or pureed/blenderized diet for a few days until you feel better and no longer constipated. Be sure to drink plenty of fluids every day to avoid getting dehydrated (feeling dizzy, not urinating, etc.). Gradually add a fiber supplement to your diet over the next week.  Gradually get back to a regular solid diet.  Avoid fast food or heavy meals the first week as you are more likely to get nauseated. It is expected for your digestive tract to need a few months to get back to normal.  It is common for your bowel movements and stools to be irregular.  You will have occasional bloating and cramping that should eventually fade away.  Until you are eating solid food normally, off all pain medications, and back to regular activities; your bowels will not be normal. Focus on eating a low-fat, high fiber diet the rest of your life  (See Getting to [place], below).  CARE of your INCISION or WOUND It is good for closed incision and even open wounds to be washed every day.  Shower every day.  Short baths are fine.  Wash the incisions and wounds clean with soap & water.     If you have a closed incision(s), wash the incision with soap & water every day.  You may leave closed incisions open to air if it is dry.   You may cover the incision with clean gauze & replace it after your daily shower for comfort.  It is good for closed incisions and even open wounds to be washed every day.  Shower every day.  Short baths are fine.  Wash the incisions and wounds clean with soap & water.    You may leave closed incisions open to air if it is dry.   You may cover the incision with clean gauze & replace it after your daily shower for comfort.    ACTIVITIES as tolerated Start light daily activities --- self-care, walking, climbing stairs-- beginning the day after surgery.  Gradually increase activities as tolerated.  Control your pain to be active.  Stop when you are tired.  Ideally, walk several times a day, eventually an hour a day.   Most people are back to most day-to-day activities in a few weeks.  It takes 4-8 weeks to get back to unrestricted, intense activity. If you can walk 30 minutes without difficulty, it is safe to try more intense activity such as jogging, treadmill, bicycling, low-impact aerobics, swimming,  etc. Save the most intensive and strenuous activity for last (Usually 4-8 weeks after surgery) such as sit-ups, heavy lifting, contact sports, etc.  Refrain from any intense heavy lifting or straining until you are off narcotics for pain control.  You will have off days, but things should improve week-by-week. DO NOT PUSH THROUGH PAIN.  Let pain be your guide: If it hurts to do something, don't do it.  Pain is your body warning you to avoid that activity for another week until the pain goes down. You may drive when  you are no longer taking narcotic prescription pain medication, you can comfortably wear a seatbelt, and you can safely make sudden turns/stops to protect yourself without hesitating due to pain. You may have sexual intercourse when it is comfortable. If it hurts to do something, stop.  MEDICATIONS Take your usually prescribed home medications unless otherwise directed.   Blood thinners:  Usually you can restart any strong blood thinners after the second postoperative day.  It is OK to take aspirin right away.     If you are on strong blood thinners (warfarin/Coumadin, Plavix, Xerelto, Eliquis, Pradaxa, etc), discuss with your surgeon, medicine PCP, and/or cardiologist for instructions on when to restart the blood thinner & if blood monitoring is needed (PT/INR blood check, etc).     PAIN CONTROL Pain after surgery or related to activity is often due to strain/injury to muscle, tendon, nerves and/or incisions.  This pain is usually short-term and will improve in a few months.  To help speed the process of healing and to get back to regular activity more quickly, DO THE FOLLOWING THINGS TOGETHER: Increase activity gradually.  DO NOT PUSH THROUGH PAIN Use Ice and/or Heat Try Gentle Massage and/or Stretching Take over the counter pain medication Take Narcotic prescription pain medication for more severe pain  Good pain control = faster recovery.  It is better to take more medicine to be more active than to stay in bed all day to avoid medications.  Increase activity gradually Avoid heavy lifting at first, then increase to lifting as tolerated over the next 6 weeks. Do not "push through" the pain.  Listen to your body and avoid positions and maneuvers than reproduce the pain.  Wait a few days before trying something more intense Walking an hour a day is encouraged to help your body recover faster and more safely.  Start slowly and stop when getting sore.  If you can walk 30 minutes without  stopping or pain, you can try more intense activity (running, jogging, aerobics, cycling, swimming, treadmill, sex, sports, weightlifting, etc.) Remember: If it hurts to do it, then don't do it! Use Ice and/or Heat You will have swelling and bruising around the incisions.  This will take several weeks to resolve. Ice packs or heating pads (6-8 times a day, 30-60 minutes at a time) will help sooth soreness & bruising. Some people prefer to use ice alone, heat alone, or alternate between ice & heat.  Experiment and see what works best for you.  Consider trying ice for the first few days to help decrease swelling and bruising; then, switch to heat to help relax sore spots and speed recovery. Shower every day.  Short baths are fine.  It feels good!  Keep the incisions and wounds clean with soap & water.   Try Gentle Massage and/or Stretching Massage at the area of pain many times a day Stop if you feel pain - do not overdo it Take  over the counter pain medication This helps the muscle and nerve tissues become less irritable and calm down faster Choose ONE of the following over-the-counter anti-inflammatory medications: Acetaminophen 500mg  tabs (Tylenol) 1-2 pills with every meal and just before bedtime (avoid if you have liver problems or if you have acetaminophen in you narcotic prescription) Naproxen 220mg  tabs (ex. Aleve, Naprosyn) 1-2 pills twice a day (avoid if you have kidney, stomach, IBD, or bleeding problems) Ibuprofen 200mg  tabs (ex. Advil, Motrin) 3-4 pills with every meal and just before bedtime (avoid if you have kidney, stomach, IBD, or bleeding problems) Take with food/snack several times a day as directed for at least 2 weeks to help keep pain / soreness down & more manageable. Take Narcotic prescription pain medication for more severe pain A prescription for strong pain control is often given to you upon discharge (for example: oxycodone/Percocet, hydrocodone/Norco/Vicodin, or  tramadol/Ultram) Take your pain medication as prescribed. Be mindful that most narcotic prescriptions contain Tylenol (acetaminophen) as well - avoid taking too much Tylenol. If you are having problems/concerns with the prescription medicine (does not control pain, nausea, vomiting, rash, itching, etc.), please call us [phone] to see if we need to switch you to a different pain medicine that will work better for you and/or control your side effects better. If you need a refill on your pain medication, you must call the office before 4 pm and on weekdays only.  By federal law, prescriptions for narcotics cannot be called into a pharmacy.  They must be filled out on paper & picked up from our office by the patient or authorized caretaker.  Prescriptions cannot be filled after 4 pm nor on weekends.    WHEN TO CALL us [phone] Severe uncontrolled or worsening pain  Fever over 101 F (38.5 C) Concerns with the incision: Worsening pain, redness, rash/hives, swelling, bleeding, or drainage Reactions / problems with new medications (itching, rash, hives, nausea, etc.) Nausea and/or vomiting Difficulty urinating Difficulty breathing Worsening fatigue, dizziness, lightheadedness, blurred vision Other concerns If you are not getting better after two weeks or are noticing you are getting worse, contact our office [phone] for further advice.  We may need to adjust your medications, re-evaluate you in the office, send you to the emergency room, or see what other things we can do to help. The clinic staff is available to answer your questions during regular business hours (8:30am-5pm).  Please don't hesitate to call and ask to speak to one of our nurses for clinical concerns.    A surgeon from [facility] is always on call at the hospitals 24 hours/day If you have a medical emergency, go to the nearest emergency room or call 911.  FOLLOW UP in our office One the day of your  discharge from the hospital (or the next business weekday), please call [facility] to set up or confirm an appointment to see your surgeon in the office for a follow-up appointment.  Usually it is 2-3 weeks after your surgery.   If you have skin staples at your incision(s), let the office know so we can set up a time in the office for the nurse to remove them (usually around 10 days after surgery). Make sure that you call for appointments the day of discharge (or the next business weekday) from the hospital to ensure a convenient appointment time. IF YOU HAVE DISABILITY OR FAMILY LEAVE FORMS, BRING THEM TO THE OFFICE FOR PROCESSING.  DO NOT GIVE  THEM TO YOUR DOCTOR.  [facility] [address] [place], [place], [place]  [address] ? [phone] - Main [phone] - [place],  [phone] - Fax [email or web address]    GETTING TO GOOD BOWEL HEALTH. It is expected for your digestive tract to need a few months to get back to normal.  It is common for your bowel movements and stools to be irregular.  You will have occasional bloating and cramping that should eventually fade away.  Until you are eating solid food normally, off all pain medications, and back to regular activities; your bowels will not be normal.   Avoiding constipation The goal: ONE SOFT BOWEL MOVEMENT A DAY!    Drink plenty of fluids.  Choose water first. TAKE A FIBER SUPPLEMENT EVERY DAY THE REST OF YOUR LIFE During your first week back home, gradually add back a fiber supplement every day Experiment which form you can tolerate.   There are many forms such as powders, tablets, wafers, gummies, etc Psyllium bran (Metamucil), methylcellulose (Citrucel), Miralax or Glycolax, Benefiber, Flax Seed.  Adjust the dose week-by-week (1/2 dose/day to 6 doses a day) until you are moving your bowels 1-2 times a day.  Cut back the dose or try a different fiber product if it is giving you  problems such as diarrhea or bloating. Sometimes a laxative is needed to help jump-start bowels if constipated until the fiber supplement can help regulate your bowels.  If you are tolerating eating & you are farting, it is okay to try a gentle laxative such as double dose MiraLax, prune juice, or Milk of Magnesia.  Avoid using laxatives too often. Stool softeners can sometimes help counteract the constipating effects of narcotic pain medicines.  It can also cause diarrhea, so avoid using for too long. If you are still constipated despite taking fiber daily, eating solids, and a few doses of laxatives, call our office. Controlling diarrhea Try drinking liquids and eating bland foods for a few days to avoid stressing your intestines further. Avoid dairy products (especially milk & ice cream) for a short time.  The intestines often can lose the ability to digest lactose when stressed. Avoid foods that cause gassiness or bloating.  Typical foods include beans and other legumes, cabbage, broccoli, and dairy foods.  Avoid greasy, spicy, fast foods.  Every person has some sensitivity to other foods, so listen to your body and avoid those foods that trigger problems for you. Probiotics (such as active yogurt, Align, etc) may help repopulate the intestines and colon with normal bacteria and calm down a sensitive digestive tract Adding a fiber supplement gradually can help thicken stools by absorbing excess fluid and retrain the intestines to act more normally.  Slowly increase the dose over a few weeks.  Too much fiber too soon can backfire and cause cramping & bloating. It is okay to try and slow down diarrhea with a few doses of antidiarrheal medicines.   Bismuth subsalicylate (ex. Kayopectate, Pepto Bismol) for a few doses can help control diarrhea.  Avoid if pregnant.   Loperamide (Imodium) can slow down diarrhea.  Start with one tablet (2mg ) first.  Avoid if you are having fevers or severe pain.    TROUBLESHOOTING IRREGULAR BOWELS 1) Start with a soft & bland diet. No spicy, greasy, or fried foods.  2) Avoid gluten/wheat or dairy products from diet to see if symptoms improve. 3) Miralax 17gm or flax seed mixed in [place]. water or juice-daily. May use  2-4 times a day as needed. 4) Gas-X, Phazyme, etc. as needed for gas & bloating.  5) Prilosec (omeprazole) over-the-counter as needed 6)  Consider probiotics (Align, Activa, etc) to help calm the bowels down  Call your doctor if you are getting worse or not getting better.  Sometimes further testing (cultures, endoscopy, X-ray studies, CT scans, bloodwork, etc.) may be needed to help diagnose and treat the cause of the diarrhea. [facility], [place], [place], [place], [place]  [address] [phone] - Main.    [phone]  - Toll Free.   [phone] - Fax [email or web address]    Colorectal Cancer  Colorectal cancer is a cancerous (malignant) tumor in the colon or rectum, which are parts of the large intestine. A tumor is a mass of cells or tissue. The cancer can spread (metastasize) to other parts of the body. What are the causes? This condition is usually caused by abnormal growths called polyps on the inner wall of the colon or rectum. Left untreated, these polyps can develop into cancer. Other times, abnormal changes to genes (gene mutations) can cause cells to become cancerous. What increases the risk? The following factors may make you more likely to develop this condition: Being older than age 23. Having a personal or family history of colorectal cancer or polyps in your colon. Having diabetes, or having had cancer and cancer treatments such as radiation before. Having certain hereditary conditions, such as: Lynch syndrome. Familial adenomatous polyposis. Turcot syndrome. Peutz-Jeghers syndrome. MUTYH-associated polyposis (MAP). Being overweight or obese. Having a diet that  is: High in red meats, such as beef, pork, lamb, or liver. High in precooked, cured, or other processed meat, such as sausages, meat loaves, and hot dogs. Low in fiber, such as fiber found in whole grains, fruits, and vegetables. Being inactive (sedentary), smoking, or drinking too much alcohol. Having an inflammatory bowel disease, such as ulcerative colitis or Crohn's disease. What are the signs or symptoms? Early colorectal cancer often does not cause symptoms. As the cancer grows, symptoms may include: Changes in bowel habits. Feeling like the bowel does not empty completely after a bowel movement. Stools (feces) that are narrower than usual, or blood in the stool or toilet after a bowel movement. The blood may be bright red or very dark in color. Diarrhea, constipation, or frequent gas pain. Anemia, constant tiredness (fatigue), or nausea and vomiting. Discomfort, pain, bloating, fullness, or cramps in the abdomen. Unexplained weight loss. How is this diagnosed? This condition may be diagnosed with: A medical history. A physical exam. Tests. These may include: An exam of the rectum using a gloved finger (digital rectal exam). A stool test called a fecal occult blood test. Blood tests. A biopsy. This is removal of a tissue sample from the colon or rectum to be looked at under a microscope. You may also have other tests, including: X-rays, CT scans, MRIs, or a PET scan. A sigmoidoscopy. This test is done to view the inside of the rectum. A colonoscopy. This test is done to view the inside of the colon. During this test, small polyps can be removed or biopsies may be taken. An endorectal ultrasound. This test checks how deep a tumor in the rectum has grown and whether the cancer has spread to lymph nodes or other nearby tissues. Additional tests may be done to find out whether the cancer has spread to other parts of the body (what stage it is). The stages of cancer include:  Stage 0 -  At this stage, the cancer is found only in the innermost lining of the colon or rectum. The tumor has not spread to other tissue. Stage 1 (I) - At this stage, the cancer has grown into the inner wall (muscle layer) of the colon or rectum. Stage 2 (II) - At this stage, the cancer has grown more deeply into the wall of the colon or rectum or through the wall. It may have invaded nearby tissue or organs. Stage 3 (III) - At this stage, the cancer has spread to nearby lymph nodes or tissue near the lymph nodes. Stage 4 (IV) - At this stage, the cancer has spread to other parts of the body that are not near the colon, such as the liver or lungs. How is this treated? Treatment for this condition depends on the type and stage of the cancer. Treatment may include: Surgery. In the early stages of the cancer, surgery may be done to remove polyps or small tumors from the colon. In later stages, surgery may be done to remove part of the colon (partial colectomy). Chemotherapy. This treatment uses medicines to kill cancer cells. Targeted therapy. This treatment can kill tumor cells by targeting specific gene mutations or proteins that the cancer expresses. Immunotherapy (biologic therapy). This treatment uses your body's disease-fighting system (immune system) to fight the cancer. Substances made by your body or in a laboratory are used to boost, direct, or restore your body's natural defenses against cancer. Radiation therapy. This treatment uses radiation to kill cancer cells or shrink tumors. Radiofrequency ablation. This treatment uses radio waves to destroy the tumors that may have spread to other areas of the body, such as the liver. Follow these instructions at home: Take over-the-counter and prescription medicines only as told by your health care provider. Try to eat regular, healthy meals. Some of your treatments might affect your appetite. Ask to meet with a dietitian if you are having problems eating or  with your appetite. Consider joining a support group. This may help you learn about your diagnosis and manage the stress of having colorectal cancer. If you are admitted to the hospital, tell your cancer care team. Keep all follow-up visits. This is important. How is this prevented? Colorectal cancer can be prevented with screening tests that find polyps so they can be removed before they develop into cancer. All adults should have screening for colorectal cancer starting at age 52 and continuing until age 91. Your health care provider may recommend screening before age 32. People at increased risk should start screening at an earlier age. You may be able to help reduce your risk of developing colorectal cancer by staying at a healthy weight, eating a healthy diet, avoiding tobacco and alcohol use, and being physically active. Where to find more information American Cancer Society: cancer.[place]): cancer.gov Contact a health care provider if: Your diarrhea or constipation does not go away. You have blood in your stool or in the toilet after a bowel movement. Your bowel habits change. You have increased pain in your abdomen. You notice new fatigue or weakness. You lose weight without a known reason. Get help right away if: You have increased bleeding from the rectum. You have any uncontrollable or severe abdominal symptoms. Summary Colorectal cancer is a cancerous (malignant) tumor in the colon or rectum, which are parts of the large intestine. Common risk factors for this condition include being older than age 31, having a personal or  family history of colorectal cancer or colon polyps, having certain hereditary conditions, or having conditions such as diabetes or inflammatory bowel disease. This condition may be diagnosed with tests, such as a colonoscopy and biopsy. Treatment depends on the type and stage of the cancer. Often, treatment includes surgery to  remove the abnormal tissue, along with chemotherapy, targeted therapy, or immunotherapy. Keep all follow-up visits. This is important. This information is not intended to replace advice given to you by your health care provider. Make sure you discuss any questions you have with your health care provider. Document Revised: 09/23/2019 Document Reviewed: 09/23/2019 Elsevier Patient Education  2021 [employer].

## 2020-10-31 NOTE — Progress Notes (Signed)
PROGRESS NOTE  Robin [name]:[id number] DOB: Sep 16, 1934   PCP: [name], PA-C  Patient is from: Home.  Very independent at baseline.  DOA: 10/26/2020 LOS: 4  Chief complaints: Anemia  Brief Narrative / Interim history: 86 year old F with PMH of A. fib on Eliquis and HTN presented to ED for anemia and shortness of breath, and admitted for blood loss anemia due to GI bleed.  Hgb 8.5 (reportedly 11 in January 2022 per daughter).  Hemoccult positive.  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  Colonoscopy on 5/13 with "obvious malignant tumor adjacent to the IC valve (biopsied) and several colonic polyps which were removed and retrieved".   MRI abdomen with and without contrast without significant finding within the visualized portion of the abdomen.   General surgery consulted. Planning surgery today.  Subjective: Seen and examined earlier this morning before she went down for surgery.  No major events overnight of this morning.  Patient and daughter passed a lot of questions about surgery and plan of care after surgery.  Otherwise, no complaints.  Understandably anxious.   Objective: Vitals:   10/30/20 2156 10/31/20 0412 10/31/20 0436 10/31/20 1109  BP:  134/77  (!) 148/62  Pulse: 74 (!) 103  (!) 101  Resp:  18  20  Temp:  (!) 97.4 F (36.3 C)  97.6 F (36.4 C)  TempSrc:  Oral  Oral  SpO2:  91% 97% 100%  Weight:      Height:        Intake/Output Summary (Last 24 hours) at 10/31/2020 1307 Last data filed at 10/30/2020 2103 Gross per 24 hour  Intake 515.5 ml  Output --  Net 515.5 ml   Filed Weights   10/26/20 1729  Weight: 66 kg    Examination:  GENERAL: No apparent distress.  Nontoxic. HEENT: MMM.  Vision and hearing grossly intact.  NECK: Supple.  No apparent JVD.  RESP:  No IWOB.  Fair aeration bilaterally. CVS: Irregular rhythm.  Normal rate.  Heart sounds normal.   ABD/GI/GU: BS+. Abd soft, NTND.  MSK/EXT:  Moves extremities. No apparent deformity.  Trace edema. SKIN: no apparent skin lesion or wound NEURO: Awake and alert. Oriented appropriately.  No apparent focal neuro deficit. PSYCH: Calm. Normal affect.  Procedures:  5/13-Colonoscopy on 5/13 with "obvious malignant tumor adjacent to the IC valve (biopsied) and several colonic polyps which were removed and retrieved".   Microbiology summarized: -COVID-19 and influenza PCR nonreactive.  Assessment & Plan: Severe iron deficiency anemia due to acute on chronic blood loss from GI bleed: FOBT positive.  Hgb 13.7 in 2019, and reportedly about 12 in January 2022 per daughter. Recent Labs    10/26/20 1919 10/27/20 0551 10/28/20 0720 10/29/20 0626 10/30/20 0525 10/31/20 0525  HGB 8.5* 8.0* 9.0* 7.9* 8.4* 9.8*  -Transfused 1 unit with appropriate response. -Received IV iron on 5/12 and 5/15 -Monitor H&H -Continue holding Eliquis  Ascending colon mass with ileocecal adenopathy: Noted on CT abdomen and pelvis and colonoscopy.  -MRI as above.  Pathology from proximal mass with invasive adenocarcinoma.  Pathology of polyp from transverse colon and splenic flexure with at least high-grade dysplasia.  -Patient is scheduled for surgery today. -Continue holding Eliquis -Patient seems to be stable from cardiopulmonary standpoint.  No history of CHF or unstable arrhythmia.   Paroxysmal A. fib: On Toprol-XL, Cardizem and Eliquis at home.  CHA2DS2-VASc score 4 (age, sex, HTN) -Continue Toprol-XL and Cardizem -  Continue holding Eliquis  Essential hypertension: Normotensive. -Continue metoprolol and diltiazem. -Hold Maxzide in the setting of hypokalemia  Hypokalemia/hypophosphatemia: Hypokalemia could be due to HCTZ.  Resolved. -K-Dur 40 mill equivalent x1  Nutrition: Prealbumin 10.8. -Ensure Enlive and boost per dietitian Body mass index is 24.98 kg/m. Nutrition Problem: Increased nutrient  needs Etiology: acute illness Signs/Symptoms: estimated needs Interventions: Ensure Enlive (each supplement provides 350kcal and 20 grams of protein)   DVT prophylaxis:  SCD's Start: 10/31/20 0825 SCDs Start: 10/26/20 2337  Code Status: Full code Family Communication: Updated patient's daughter at bedside. Level of care: Telemetry Status is: Inpatient  Remains inpatient appropriate because:Ongoing diagnostic testing needed not appropriate for outpatient work up and Inpatient level of care appropriate due to severity of illness   Dispo: The patient is from: Home              Anticipated d/c is to: Home              Patient currently is not medically stable to d/c.   Difficult to place patient No       Consultants:  Gastroenterology General surgery   Sch Meds:  Scheduled Meds: . [MAR Hold] atorvastatin  5 mg Oral Once per day on Mon Wed Fri  . bupivacaine liposome  20 mL Infiltration Once  . Chlorhexidine Gluconate Cloth  6 each Topical Once  . [MAR Hold] cholecalciferol  2,000 Units Oral QHS  . [MAR Hold] diltiazem  180 mg Oral Daily  . [MAR Hold] estradiol  0.5 mg Oral Daily  . [MAR Hold] feeding supplement  1 Container Oral BID BM  . [MAR Hold] feeding supplement  237 mL Oral BID BM  . [MAR Hold] ferrous sulfate  325 mg Oral BID WC  . [MAR Hold] metoprolol succinate  50 mg Oral BID  . [MAR Hold] multivitamin  1 tablet Oral Daily  . [MAR Hold] potassium chloride SA  20 mEq Oral Daily   Continuous Infusions: . cefoTEtan (CEFOTAN) IV    . dextrose 5 % and 0.45 % NaCl with KCl 20 mEq/L 75 mL/hr at 10/31/20 1046  . lactated ringers 50 mL/hr at 10/31/20 1120   PRN Meds:.[MAR Hold] acetaminophen **OR** [MAR Hold] acetaminophen, [MAR Hold] diphenoxylate-atropine, [MAR Hold] LORazepam, [MAR Hold] metoprolol tartrate, [MAR Hold] ondansetron **OR** [MAR Hold] ondansetron (ZOFRAN) IV  Antimicrobials: Anti-infectives (From admission, onward)   Start     Dose/Rate Route  Frequency Ordered Stop   10/31/20 0915  cefoTEtan (CEFOTAN) 2 g in sodium chloride 0.9 % 100 mL IVPB        2 g 200 mL/hr over 30 Minutes Intravenous On call to O.R. 10/31/20 [id number] 11/01/20 0559       I have personally reviewed the following labs and images: CBC: Recent Labs  Lab 10/26/20 1919 10/27/20 0551 10/28/20 0720 10/29/20 0626 10/30/20 0525 10/31/20 0525  WBC 6.3 6.3 7.0 7.9 8.6  --   NEUTROABS 3.9  --  4.8 5.5  --   --   HGB 8.5* 8.0* 9.0* 7.9* 8.4* 9.8*  HCT 29.7* 27.8* 31.6* 28.0* 29.7* 33.2*  MCV 75.0* 75.5* 75.4* 75.5* 76.2*  --   PLT 353 305 362 340 344  --    BMP &GFR Recent Labs  Lab 10/26/20 1919 10/27/20 0551 10/28/20 0720 10/29/20 0626 10/30/20 0525 10/31/20 0525  NA 135 137 135 135 136  --   K 3.3* 3.2* 3.2* 3.0* 4.3 3.6  CL 103 105 104 101 104  --  CO2 28 27 24 26 27   --   GLUCOSE 106* 100* 111* 105* 102*  --   BUN 12 11 11 10 11   --   CREATININE 0.55 0.62 0.62 0.67 0.60  --   CALCIUM 10.1 9.6 10.2 9.9 9.9  --   MG  --   --  2.0  --  2.0 2.2  PHOS  --   --  2.3*  --  2.4* 3.1   Estimated Creatinine Clearance: 49.8 mL/min (by C-G formula based on SCr of 0.6 mg/dL). Liver & Pancreas: Recent Labs  Lab 10/26/20 1919 10/30/20 0525  AST 18  --   ALT 14  --   ALKPHOS 104  --   BILITOT 1.1  --   PROT 6.7  --   ALBUMIN 3.8 3.3*   Recent Labs  Lab 10/26/20 1919  LIPASE 31   No results for input(s): AMMONIA in the last 168 hours. Diabetic: No results for input(s): HGBA1C in the last 72 hours. No results for input(s): GLUCAP in the last 168 hours. Cardiac Enzymes: No results for input(s): CKTOTAL, CKMB, CKMBINDEX, TROPONINI in the last 168 hours. No results for input(s): PROBNP in the last 8760 hours. Coagulation Profile: Recent Labs  Lab 10/26/20 1930  INR 1.4*   Thyroid Function Tests: No results for input(s): TSH, T4TOTAL, FREET4, T3FREE, THYROIDAB in the last 72 hours. Lipid Profile: No results for input(s): CHOL, HDL,  LDLCALC, TRIG, CHOLHDL, LDLDIRECT in the last 72 hours. Anemia Panel: No results for input(s): VITAMINB12, FOLATE, FERRITIN, TIBC, IRON, RETICCTPCT in the last 72 hours. Urine analysis: No results found for: COLORURINE, APPEARANCEUR, LABSPEC, PHURINE, GLUCOSEU, HGBUR, BILIRUBINUR, KETONESUR, PROTEINUR, UROBILINOGEN, NITRITE, LEUKOCYTESUR Sepsis Labs: Invalid input(s): PROCALCITONIN, [place]  Microbiology: Recent Results (from the past 240 hour(s))  Resp Panel by RT-PCR (Flu A&B, Covid) Nasopharyngeal Swab     Status: None   Collection Time: 10/26/20 10:00 PM   Specimen: Nasopharyngeal Swab; Nasopharyngeal(NP) swabs in vial transport medium  Result Value Ref Range Status   SARS Coronavirus 2 by RT PCR NEGATIVE NEGATIVE Final    Comment: (NOTE) SARS-CoV-2 target nucleic acids are NOT DETECTED.  The SARS-CoV-2 RNA is generally detectable in upper respiratory specimens during the acute phase of infection. The lowest concentration of SARS-CoV-2 viral copies this assay can detect is 138 copies/mL. A negative result does not preclude SARS-Cov-2 infection and should not be used as the sole basis for treatment or other patient management decisions. A negative result may occur with  improper specimen collection/handling, submission of specimen other than nasopharyngeal swab, presence of viral mutation(s) within the areas targeted by this assay, and inadequate number of viral copies(<138 copies/mL). A negative result must be combined with clinical observations, patient history, and epidemiological information. The expected result is Negative.  Fact Sheet for Patients:  EntrepreneurPulse.com.au  Fact Sheet for Healthcare Providers:  IncredibleEmployment.be  This test is no t yet approved or cleared by the [place] FDA and  has been authorized for detection and/or diagnosis of SARS-CoV-2 by FDA under an Emergency Use Authorization (EUA). This EUA  will remain  in effect (meaning this test can be used) for the duration of the COVID-19 declaration under Section 564(b)(1) of the Act, 21 U.S.C.section 360bbb-3(b)(1), unless the authorization is terminated  or revoked sooner.       Influenza A by PCR NEGATIVE NEGATIVE Final   Influenza B by PCR NEGATIVE NEGATIVE Final    Comment: (NOTE) The Xpert Xpress SARS-CoV-2/FLU/RSV plus assay is intended  as an aid in the diagnosis of influenza from Nasopharyngeal swab specimens and should not be used as a sole basis for treatment. Nasal washings and aspirates are unacceptable for Xpert Xpress SARS-CoV-2/FLU/RSV testing.  Fact Sheet for Patients: EntrepreneurPulse.com.au  Fact Sheet for Healthcare Providers: IncredibleEmployment.be  This test is not yet approved or cleared by the [place] FDA and has been authorized for detection and/or diagnosis of SARS-CoV-2 by FDA under an Emergency Use Authorization (EUA). This EUA will remain in effect (meaning this test can be used) for the duration of the COVID-19 declaration under Section 564(b)(1) of the Act, 21 U.S.C. section 360bbb-3(b)(1), unless the authorization is terminated or revoked.  Performed at [facility], [place] [address], [place], [place] [address]   Surgical pcr screen     Status: None   Collection Time: 10/31/20 10:19 AM   Specimen: Nasal Mucosa; Nasal Swab  Result Value Ref Range Status   MRSA, PCR NEGATIVE NEGATIVE Final   Staphylococcus aureus NEGATIVE NEGATIVE Final    Comment: (NOTE) The Xpert SA Assay (FDA approved for NASAL specimens in patients 46 years of age and older), is one component of a comprehensive surveillance program. It is not intended to diagnose infection nor to guide or monitor treatment. Performed at [facility], [place] [address], [place], [place] [address]     Radiology Studies: No results found.    Fardeen Steinberger T.  Harper  If 7PM-7AM, please contact night-coverage [email or web address] 10/31/2020, 1:07 PM

## 2020-10-31 NOTE — Anesthesia Procedure Notes (Signed)
Procedure Name: Intubation Date/Time: 10/31/2020 12:42 PM Performed by: Gwyndolyn Saxon, CRNA Pre-anesthesia Checklist: Patient identified, Emergency Drugs available, Suction available and Patient being monitored Patient Re-evaluated:Patient Re-evaluated prior to induction Oxygen Delivery Method: Circle system utilized Preoxygenation: Pre-oxygenation with 100% oxygen Induction Type: IV induction Ventilation: Mask ventilation without difficulty Laryngoscope Size: Miller and 2 Grade View: Grade I Tube type: Oral Tube size: 6.5 mm Number of attempts: 1 Airway Equipment and Method: Patient positioned with wedge pillow and Stylet Placement Confirmation: ETT inserted through vocal cords under direct vision,  positive ETCO2 and breath sounds checked- equal and bilateral Secured at: 20 cm Tube secured with: Tape Dental Injury: Teeth and Oropharynx as per pre-operative assessment

## 2020-10-31 NOTE — Progress Notes (Signed)
Chaplain's Akshaya Toepfer and Vaughan Basta engaged in an initial visit with Robin [name] and her daughter.  They wanted prayer around [name]'s upcoming surgery.  They talked about how quickly they have learned of what has been happening to [name]'s body.  They both expressed their anxiety and also hopes around the surgery.  [name] is hoping she will not need a colostomy bag.  Chaplains offered listening, presence and prayer with them.   Chaplains are available to provide further support.      10/31/20 1100  Clinical Encounter Type  Visited With Patient and family together  Visit Type Initial;Spiritual support  Referral From Nurse;Family  Consult/Referral To Chaplain  Spiritual Encounters  Spiritual Needs Prayer

## 2020-10-31 NOTE — Anesthesia Postprocedure Evaluation (Signed)
Anesthesia Post Note  Patient: Marylin E Cory  Procedure(s) Performed: LAPAROSCOPIC RIGHT COLON RESECTION, lysis of adhesions, hemicolectomy, bilateral tap block (N/A Abdomen)     Patient location during evaluation: PACU Anesthesia Type: General Level of consciousness: awake and alert, patient cooperative and oriented Pain management: pain level controlled Vital Signs Assessment: post-procedure vital signs reviewed and stable Respiratory status: spontaneous breathing, nonlabored ventilation and respiratory function stable Cardiovascular status: blood pressure returned to baseline and stable Postop Assessment: no apparent nausea or vomiting Anesthetic complications: no   No complications documented.  Last Vitals:  Vitals:   10/31/20 1645 10/31/20 1700  BP: (!) 143/75 138/71  Pulse: 71 91  Resp: 16 18  Temp:    SpO2: 100% 100%    Last Pain:  Vitals:   10/31/20 1700  TempSrc:   PainSc: 0-No pain                 Moise Friday,E. Lenwood Balsam

## 2020-10-31 NOTE — Anesthesia Preprocedure Evaluation (Addendum)
Anesthesia Evaluation  Patient identified by MRN, date of birth, ID band Patient awake    Reviewed: Allergy & Precautions, NPO status , Patient's Chart, lab work & pertinent test results, reviewed documented beta blocker date and time   Airway Mallampati: I  TM Distance: >3 FB Neck ROM: Full    Dental no notable dental hx. (+) Teeth Intact, Dental Advisory Given   Pulmonary neg pulmonary ROS,    Pulmonary exam normal breath sounds clear to auscultation       Cardiovascular hypertension, Pt. on home beta blockers and Pt. on medications + Peripheral Vascular Disease  Normal cardiovascular exam+ dysrhythmias (on eliquis, h/o 1st degree AV block and PACs) Atrial Fibrillation  Rhythm:Regular Rate:Normal  TTE 2020 1. The left ventricle has normal systolic function, with an ejection  fraction of 55-60%. The cavity size was normal. Left ventricular diastolic  Doppler parameters are indeterminate. No evidence of left ventricular  regional wall motion abnormalities.  2. Normal right ventricular size with mildly decreased systolic function.  D-shaped interventricular septum suggesting RV pressure/volume overload.  3. Left atrial size was mildly dilated.  4. Right atrial size was mildly dilated.  5. Trivial pericardial effusion is present.  6. Mild calcification of the mitral valve leaflet. No evidence of mitral  valve stenosis. Mild mitral regurgitation.  7. The aortic valve is tricuspid Mild calcification of the aortic valve.  no stenosis of the aortic valve.  8. The aortic root and ascending aorta are normal in size and structure.  9. The IVC is normal in size with PA systolic pressure 35 mmHg. The   Stress Test 2017 Nuclear stress EF: 81%. The left ventricular ejection fraction is hyperdynamic (>65%). The study is normal. This is a low risk study  Event Monitor 2020 Quality: Fair.  Baseline artifact. Predominant  rhythm: atrial fibrillation Average heart rate: 64 bpm Max heart rate: 106 bpm Min heart rate: 38 bpm    Neuro/Psych negative neurological ROS  negative psych ROS   GI/Hepatic negative GI ROS, Neg liver ROS,   Endo/Other  negative endocrine ROS  Renal/GU negative Renal ROS  negative genitourinary   Musculoskeletal negative musculoskeletal ROS (+)   Abdominal   Peds  Hematology  (+) Blood dyscrasia (Hgb 9.8 s/p 1U RBCs yesterday), anemia ,   Anesthesia Other Findings Ascending colon mass  Reproductive/Obstetrics                            Anesthesia Physical Anesthesia Plan  ASA: III  Anesthesia Plan: General   Post-op Pain Management:    Induction: Intravenous  PONV Risk Score and Plan: 3 and Dexamethasone, Ondansetron and Treatment may vary due to age or medical condition  Airway Management Planned: Oral ETT  Additional Equipment:   Intra-op Plan:   Post-operative Plan: Extubation in OR  Informed Consent: I have reviewed the patients History and Physical, chart, labs and discussed the procedure including the risks, benefits and alternatives for the proposed anesthesia with the patient or authorized representative who has indicated his/her understanding and acceptance.     Dental advisory given  Plan Discussed with: CRNA  Anesthesia Plan Comments:         Anesthesia Quick Evaluation

## 2020-10-31 NOTE — Progress Notes (Signed)
3 Days Post-Op  Subjective: Patient with no abdominal pain this am.  Having some diarrhea.  No other complaints.  Was unaware of the possibility of surgery today.  Daughter present in the room.  ROS: See above, otherwise other systems negative  Objective: Vital signs in last 24 hours: Temp:  [97.4 F (36.3 C)-98 F (36.7 C)] 97.4 F (36.3 C) (05/16 0412) Pulse Rate:  [51-103] 103 (05/16 0412) Resp:  [16-20] 18 (05/16 0412) BP: (113-134)/(47-77) 134/77 (05/16 0412) SpO2:  [91 %-99 %] 97 % (05/16 0436) Last BM Date: 10/30/20  Intake/Output from previous day: 05/15 0701 - 05/16 0700 In: 935.5 [P.O.:540; Blood:395.5] Out: -  Intake/Output this shift: No intake/output data recorded.  PE: Gen: NAD Heart: irregular, but rate controlled Lungs: CTAB Abd: soft, NT, Nd, +BS Psych: A&Ox3  Lab Results:  Recent Labs    10/29/20 0626 10/30/20 0525 10/31/20 0525  WBC 7.9 8.6  --   HGB 7.9* 8.4* 9.8*  HCT 28.0* 29.7* 33.2*  PLT 340 344  --    BMET Recent Labs    10/29/20 0626 10/30/20 0525 10/31/20 0525  NA 135 136  --   K 3.0* 4.3 3.6  CL 101 104  --   CO2 26 27  --   GLUCOSE 105* 102*  --   BUN 10 11  --   CREATININE 0.67 0.60  --   CALCIUM 9.9 9.9  --    PT/INR No results for input(s): LABPROT, INR in the last 72 hours. CMP     Component Value Date/Time   NA 136 10/30/2020 0525   NA 139 02/21/2018 0854   K 3.6 10/31/2020 0525   CL 104 10/30/2020 0525   CO2 27 10/30/2020 0525   GLUCOSE 102 (H) 10/30/2020 0525   BUN 11 10/30/2020 0525   BUN 20 02/21/2018 0854   CREATININE 0.60 10/30/2020 0525   CREATININE 0.54 (L) 11/28/2015 1422   CALCIUM 9.9 10/30/2020 0525   PROT 6.7 10/26/2020 1919   ALBUMIN 3.3 (L) 10/30/2020 0525   AST 18 10/26/2020 1919   ALT 14 10/26/2020 1919   ALKPHOS 104 10/26/2020 1919   BILITOT 1.1 10/26/2020 1919   GFRNONAA >60 10/30/2020 0525   GFRAA 78 02/21/2018 0854   Lipase     Component Value Date/Time   LIPASE 31  10/26/2020 1919       Studies/Results: MR LIVER W WO CONTRAST  Result Date: 10/29/2020 CLINICAL DATA:  Colorectal cancer staging EXAM: MRI ABDOMEN WITHOUT AND WITH CONTRAST TECHNIQUE: Multiplanar multisequence MR imaging of the abdomen was performed both before and after the administration of intravenous contrast. CONTRAST:  40mL GADAVIST GADOBUTROL 1 MMOL/ML IV SOLN COMPARISON:  10/28/2020 FINDINGS: Examination is generally somewhat limited by breath motion artifact throughout. Lower chest: No acute findings. Hepatobiliary: Hepatic iron deposition. No mass or other parenchymal abnormality identified. Status post cholecystectomy. No biliary ductal dilatation. Pancreas: No mass, inflammatory changes, or other parenchymal abnormality identified. Spleen:  Splenic iron deposition. Adrenals/Urinary Tract: No masses identified. Large, benign exophytic cyst arising from the inferior pole of the right kidney measuring at least 11.0 cm (series 5, image 29). Multiple additional small cortical and parapelvic cysts. No hydronephrosis. Stomach/Bowel: Visualized portions within the abdomen are unremarkable. Vascular/Lymphatic: No pathologically enlarged lymph nodes identified. No abdominal aortic aneurysm demonstrated. Aortic atherosclerosis. Other:  None. Musculoskeletal: No suspicious bone lesions identified. Bone marrow iron deposition. IMPRESSION: 1. Examination is generally somewhat limited by breath motion artifact. Within this limitation, no evidence  of lymphadenopathy or metastatic disease in the abdomen. 2. Hepatic, splenic, and bone marrow iron deposition. Correlate for history of transfusion or other etiology of iron overload. 3. Status post cholecystectomy. Electronically Signed   By: Eddie Candle M.D.   On: 10/29/2020 13:11    Anti-infectives: Anti-infectives (From admission, onward)   Start     Dose/Rate Route Frequency Ordered Stop   10/31/20 0915  cefoTEtan (CEFOTAN) 2 g in sodium chloride 0.9 % 100  mL IVPB        2 g 200 mL/hr over 30 Minutes Intravenous On call to O.R. 10/31/20 0824 11/01/20 0559       Assessment/Plan Iron deficiency anemia - likely secondary to chronic GI losses, hgb7.9 Atrial fibrillation on eliquis - LD5/11 AM HTN HLD  Ascending colon mass - colonoscopy yesterday with non-obstructing ascending colonic mass, no active bleeding observed.  Several other polyps noted  - CEA normal, CT shows mass colon mass near ileocecal valve and several small nodes in the area.   No evidence of metastatic disease - biopsies from colonoscopy pending, but prelim shows some concern for pre-cancerous changes in TC polyps and possible malignancy in the splenic flexure polyp. -will plan to proceed with lap assisted partial colectomy including right to proximal descending with a ileo-descending anastomosis.   -a long time was spent discussing this with the patient and her daughter the plans for surgery.  We discussed that her recent echo in 2020 was stable and her EKG this admission is stable.  She has no angina or exacerbating factors.  Discussed with primary service who also feels she is medically stable. -all questions answered to the best of my ability with the information available to me at the time of my visit.   FEN - NPO VTE - Lovenox on call to OR ID - Cefotetan on call to OR   LOS: 4 days    Henreitta Cea , [facility] Surgery 10/31/2020, 10:21 AM Please see Amion for pager number during day hours 7:00am-4:30pm or 7:00am -11:30am on weekends

## 2020-10-31 NOTE — Op Note (Signed)
10/31/2020  4:23 PM  PATIENT:  Robin [name]  86 y.o. female  Patient Care Team: [name], PA-C as PCP - General (Family Medicine) Skeet Latch, MD as PCP - Cardiology (Cardiology) Milus Banister, MD as Attending Physician (Gastroenterology) [name], MD as Consulting Physician (General Surgery)  PRE-OPERATIVE DIAGNOSIS:   CECAL COLON CANCER TRANSVERSE COLON POLYPS WITH HIGH GRADE DYSPLASIA & POSSIBLE ADENOCARCINOMA IRON DEFICIENCY ANEMIA FROM LOWER GI BLOOD LOSS  POST-OPERATIVE DIAGNOSIS:   CECAL COLON CANCER TRANSVERSE COLON POLYPS WITH HIGH GRADE DYSPLASIA & POSSIBLE ADENOCARCINOMA IRON DEFICIENCY ANEMIA FROM LOWER GI BLOOD LOSS  PROCEDURE:   LAPAROSCOPIC PROXIMAL HEMICOLECTOMY  OMENTOPEXY OF ANASTOMOSIS LAPAROSCOPIC LYSIS OF ADHESIONS TRANSVERSUS ABDOMINIS PLANE (TAP) BLOCK - BILATERAL  SURGEON:  Adin Hector, MD  ASSISTANT: Sheralyn Boatman, PA-C  An experienced assistant was required given the standard of surgical care given the complexity of the case.  This assistant was needed for exposure, dissection, suction, tissue approximation, retraction, perception, etc.  ANESTHESIA:     General  Nerve block provided with liposomal bupivacaine (Experel) mixed with 0.25% bupivacaine as a Bilateral TAP block x 25mL each side at the level of the transverse abdominis & preperitoneal spaces along the flank at the anterior axillary line, from subcostal ridge to iliac crest under laparoscopic guidance   Local field block at port sites & extraction wound  EBL:  Total I/O In: 1000 [I.V.:650; IV Piggyback:350] Out: 225 [Urine:150; Blood:75]  Delay start of Pharmacological VTE agent (>24hrs) due to surgical blood loss or risk of bleeding:  no  DRAINS: none   SPECIMEN: Proximal half of colon containing obvious cecal mass and polypectomy sites.  Tattoo marks largest most suspicious high-grade dysplasia lesion with probable focus of adenocarcinoma in  the mid transverse colon  DISPOSITION OF SPECIMEN:  PATHOLOGY  COUNTS:  YES  PLAN OF CARE: Admit to inpatient   PATIENT DISPOSITION:  PACU - hemodynamically stable.  INDICATION:    Patient found to have iron deficiency anemia with bleeding on full anticoagulation for cardiac issues.  Underwent upper and lower endoscopy.  Numerous colonic polyps noted and removed piecemeal.  Larger more suspicious Lowne lesion noted in the cecum near the ileocecal valve.  Cleared from medical standpoint with decent performance status.  Cecal mass obvious adenocarcinoma.  Transverse colon polyps with focuses of high-grade dysplasia.  The largest lesion tattooed that was most distal has area suspicious for adenocarcinoma as well.  I recommended at least hemicolectomy possible extended colectomy with ilio colonic anastomosis towards the left side.  I recommended segmental resection:  The anatomy & physiology of the digestive tract was discussed.  The pathophysiology was discussed.  Natural history risks without surgery was discussed.   I worked to give an overview of the disease and the frequent need to have multispecialty involvement.  I feel the risks of no intervention will lead to serious problems that outweigh the operative risks; therefore, I recommended a partial colectomy to remove the pathology.  Laparoscopic & open techniques were discussed.   Risks such as bleeding, infection, abscess, leak, reoperation, possible ostomy, hernia, heart attack, death, and other risks were discussed.  I noted a good likelihood this will help address the problem.   Goals of post-operative recovery were discussed as well.  We will work to minimize complications.  An educational handout on the pathology was given as well.  Questions were answered.    The patient expresses understanding & wishes to proceed with surgery.  OR FINDINGS:  CASE DATA:  Type of patient?: LDOW CASE (Surgical Hospitalist [facility] Inpatient)  Status of  Case? URGENT Add On  Infection Present At Time Of Surgery (PATOS)?  NO  Patient had moderately significant intra-abdominal adhesions from her prior open cholecystectomy as well as pelvic surgeries.  Obvious bulky mass in the cecum near the ileocecal valve.  Tattooing actually at the mid transverse colon.  She had a rather redundant stretched out colon.  Specimen opened on the back table and clearly the tattooed region had some fibrosis and thickening through the colon wall suspicious for some residual cancer.  No obvious metastatic disease on visceral parietal peritoneum or liver.  It is an isoperistaltic anastomosis between terminal ileum and distal transverse colon resting in the left upper quadrant.   DESCRIPTION:   Informed consent was confirmed.  The patient underwent general anaesthesia without difficulty.  The patient was positioned with arms tucked & secured appropriately.  VTE prevention in place.  The patient's abdomen was clipped, prepped, & draped in a sterile fashion.  Surgical timeout confirmed our plan.  The patient was positioned in reverse Trendelenburg.  Abdominal entry was gained using Varess technique at the left subcostal ridge on the anterior abdominal wall.  No elevated EtCO2 noted.  Port placed.  Camera inspection revealed no injury.  Extra ports were carefully placed under direct laparoscopic visualization.  We docked the [employer] robot carefully and placed intstruments under visualization  Patient had moderate adhesions.  We worked to free the greater omentum off its adhesions to the anterior abdominal wall.  I mobilized & reflected the greater omentum in the upper abdomen.  I focused on trying to inspect all of the colon to find the tattooing which was suspected to be at the splenic flexure.  After some moderate lyse adhesions of greater omentum off the anterior abdominal wall small bowel and colon I could reflected up and find the transverse colon.  Suspicious  area of tattooing noted at the mid transverse colon.  She had very dense adhesions between transverse colon and descending colon I carefully freed off to help open up this hairpin region.  Eventually can follow and confirmed there was no tattooing at the splenic flexure.  I followed the colon down the descending colon and sigmoid colon and checked in the pelvis and saw no tattooing on the left colon at all.  I freed off a few small bowel interloop adhesions.  With a region marked off I decided proceed with proximal colectomy.  I was able to elevate the proximal colon to isolate the ileocolonic pedicle.  I scored the ileal mesentery just proximal to that.   I carried that further dissection in a medial to lateral fashion.  I was able to bluntly get into the retro-mesenteric plane on the right side.  I freed the proximal right sided colonic mesentery off the retroperitoneum including the duodenal sweep, pancreatic head, & Gerota's fascia of the right kidney. I was able to get underneath the hepatic flexure.  I was able to get underneath the proximal and mid transverse colon.  I isolated the proximal ileocecal pedicle.  I skeletonized it & transected the vessels.  Continue to elevate the proximal colon off the retroperitoneum and the large simple right kidney cyst.  Came up to the hepatic flexure.  Came up over the pancreatic head towards the midline.  Then freed the greater omentum off the mid to distal transverse colon.  Her lesser sac was quite obliterated and here to but  eventually was able to free it off.  Free off the mesocolon.  Freed greater omentum off its adhesions to the liver as well.  I could get into the retroperitoneum over the pancreatic head that had been dissected free and mobilized the proximal colon in a superior to inferior fashion towards the hepatic flexure.  I then proceeded to mobilize the terminal ileum & proximal "right" colon in a lateral to medial fashion.  I mobilized the distal ileal  mesentery off its retroperitoneal and pelvic attachments.  I mobilized the ascending colon off It is side wall attachments to the paracolic gutter and retroperitoneum.  With this I was able to completely mobilize from the distal ileum to the mid transverse colon off the retroperitoneum to reflect towards the left abdomen.  Her very terminal ileum had some tight folds and scarring and fibrosis for some reason so I decided to transect the ileum 10 cm more proximally where it was soft and not fibrotic and twisted.  I could isolate the pathology. When he went ahead and proceeded with transection.  I transected the distal ileal mesentery and then transected at the distal ileum with a lap stapler 39mm blue load.  I then transected transverse colon mesentery just proximal to a dominant left middle colic arterial pedicle radially to the region in the distal transverse colon that was away from the tattooing and was mobile soft and without evidence of any serosal tear or injury..  Transected at the distal transverse colon with a 3mm blue load lap stapler.  We assured hemostasis.     We placed the wound protector through the suprapubic 84mm port site after it was enlarged in a Pfannenstiel fashion.  Specimen removed without incident.  We opened up the specimen on the back table and confirmed the obvious fibrotic cancer in the cecum.  Concede numerous polypectomy sites.  Tattooing at the mid transverse colon with some thickening of the mesentery near the colon that raise suspicions for adenocarcinoma.  There is another polypectomy site in the mid/distal transverse colon as well.  Good proximal and distal margins.  Reviewing the colonoscopy the tattooing was at the most distal concerning polypectomy site so I felt like we had adequate margins.  She had had redundant colon so I ended up taking a fair amount to make sure I had good margins and not missing anything.  I really scrubbed in sterilely.  We did a side-to-side  stapled anastomosis of ileum to mid-transverse colon using a 38mm white load in an isoperistaltic fashion.  (Distal stump of ileum to distal transverse colon for the distal end of the anastomosis.  Proximal end of colon stump to more proximal ileum for the proximal end of the anastomosis).  I sewed the common staple channel wound with an absorbable suture ( 2-0 V-lock) in a running Octavia fashion from each corner and meeting in the center.  I did meticulous inspection prove an airtight closure.  I protected the anastomosis line with an anterior omentopexy of greater omentum using V lock suture.  We did reinspection of the abdomen.  Hemostasis was good.   Ureters, retroperitoneum, and bowel uninjured.  The anastomosis looked healthy.   Endoluminal gas was evacuated.    Ports & wound protector removed.  Hemostasis was good.  Sterile unused instruments were used from this point.  I closed the skin at the port sites using Monocryl stitch and sterile dressing.  I closed the extraction wound using a 0 Vicryl vertical peritoneal closure and  a #1 PDS transverse anterior rectal fascial closure like a small Pfannenstiel closure. I closed the skin with some interrupted Monocryl stitches. I placed antibiotic-soaked wicks into the closure at the corners x2.  I placed sterile dressings.     Patient is being extubated go to recovery room. I discussed postop care with the patient in detail the office & in the holding area. Instructions are written. I discussed operative findings, updated the patient's status, discussed probable steps to recovery, and gave postoperative recommendations to the patient's daughter, Salena Saner.  Recommendations were made.  Questions were answered.  She expressed understanding & appreciation.   Adin Hector, M.D., F.A.C.S. Gastrointestinal and Minimally Invasive Surgery [facility] [address], [place], [place] [address] [phone] Main /  Paging

## 2020-10-31 NOTE — Transfer of Care (Signed)
Immediate Anesthesia Transfer of Care Note  Patient: Robin [name]  Procedure(s) Performed: LAPAROSCOPIC RIGHT COLON RESECTION, lysis of adhesions, hemicolectomy, bilateral tap block (N/A Abdomen)  Patient Location: PACU  Anesthesia Type:General  Level of Consciousness: drowsy  Airway & Oxygen Therapy: Patient Spontanous Breathing and Patient connected to face mask oxygen  Post-op Assessment: Report given to RN and Post -op Vital signs reviewed and stable  Post vital signs: Reviewed and stable  Last Vitals:  Vitals Value Taken Time  BP 143/85 10/31/20 1624  Temp    Pulse 81 10/31/20 1625  Resp 15 10/31/20 1625  SpO2 100 % 10/31/20 1625  Vitals shown include unvalidated device data.  Last Pain:  Vitals:   10/31/20 1109  TempSrc: Oral  PainSc: 0-No pain      Patients Stated Pain Goal: 3 (06/10/81 5003)  Complications: No complications documented.

## 2020-10-31 NOTE — Care Management Important Message (Signed)
Important Message  Patient Details IM Letter given to the Patient. Name: Robin [name] MRN: [id number] Date of Birth: 06/09/1935   Medicare Important Message Given:  Yes     Kerin Salen 10/31/2020, 9:07 AM

## 2020-11-01 ENCOUNTER — Encounter (HOSPITAL_COMMUNITY): Payer: Self-pay | Admitting: Surgery

## 2020-11-01 DIAGNOSIS — K6389 Other specified diseases of intestine: Secondary | ICD-10-CM | POA: Diagnosis not present

## 2020-11-01 DIAGNOSIS — D500 Iron deficiency anemia secondary to blood loss (chronic): Secondary | ICD-10-CM | POA: Diagnosis not present

## 2020-11-01 DIAGNOSIS — D509 Iron deficiency anemia, unspecified: Secondary | ICD-10-CM | POA: Diagnosis not present

## 2020-11-01 DIAGNOSIS — Z7901 Long term (current) use of anticoagulants: Secondary | ICD-10-CM | POA: Diagnosis not present

## 2020-11-01 DIAGNOSIS — C801 Malignant (primary) neoplasm, unspecified: Secondary | ICD-10-CM

## 2020-11-01 LAB — CBC
HCT: 33.7 % — ABNORMAL LOW (ref 36.0–46.0)
Hemoglobin: 10 g/dL — ABNORMAL LOW (ref 12.0–15.0)
MCH: 23.2 pg — ABNORMAL LOW (ref 26.0–34.0)
MCHC: 29.7 g/dL — ABNORMAL LOW (ref 30.0–36.0)
MCV: 78.2 fL — ABNORMAL LOW (ref 80.0–100.0)
Platelets: 294 10*3/uL (ref 150–400)
RBC: 4.31 MIL/uL (ref 3.87–5.11)
RDW: 18.8 % — ABNORMAL HIGH (ref 11.5–15.5)
WBC: 17 10*3/uL — ABNORMAL HIGH (ref 4.0–10.5)
nRBC: 0 % (ref 0.0–0.2)

## 2020-11-01 LAB — BASIC METABOLIC PANEL
Anion gap: 9 (ref 5–15)
BUN: 12 mg/dL (ref 8–23)
CO2: 24 mmol/L (ref 22–32)
Calcium: 9.3 mg/dL (ref 8.9–10.3)
Chloride: 100 mmol/L (ref 98–111)
Creatinine, Ser: 0.65 mg/dL (ref 0.44–1.00)
GFR, Estimated: >60 mL/min (ref >60)
Glucose, Bld: 158 mg/dL — ABNORMAL HIGH (ref 70–99)
Potassium: 4 mmol/L (ref 3.5–5.1)
Sodium: 133 mmol/L — ABNORMAL LOW (ref 135–145)

## 2020-11-01 LAB — MAGNESIUM: Magnesium: 1.8 mg/dL (ref 1.7–2.4)

## 2020-11-01 MED ORDER — MAGNESIUM SULFATE 2 GM/50ML IV SOLN
2.0000 g | Freq: Once | INTRAVENOUS | Status: AC
  Administered 2020-11-01: 2 g via INTRAVENOUS
  Filled 2020-11-01: qty 50

## 2020-11-01 MED ORDER — LACTATED RINGERS IV BOLUS: 1000.0000 mL | Freq: Three times a day (TID) | INTRAVENOUS | Status: AC | PRN

## 2020-11-01 NOTE — Evaluation (Signed)
Physical Therapy Evaluation Patient Details Name: Robin [name] MRN: [id number] DOB: 1935/01/28 Today's Date: 11/01/2020   History of Present Illness  86 year old F presented to ED for anemia and shortness of breath, and admitted for blood loss anemia due to GI bleed.  Hgb 8.5 (reportedly 11 in January 2022 per daughter).  Hemoccult positive.  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 s/p laparoscopic hemicolectomy 10/31/20. Pt  with PMH of A. fib on Eliquis and HTN.  Clinical Impression  Pt admitted with above diagnosis. Pt ambulated 15' with min/guard assistance, no loss of balance. Distance limited by fatigue, pt had ambulated in the hallway twice earlier today. Instructed pt in bed level strengthening exercises. Good progress expected.  Pt currently with functional limitations due to the deficits listed below (see PT Problem List). Pt will benefit from skilled PT to increase their independence and safety with mobility to allow discharge to the venue listed below.       Follow Up Recommendations No PT follow up    Equipment Recommendations  None recommended by PT    Recommendations for Other Services       Precautions / Restrictions Precautions Precautions: Other (comment) Precaution Comments: abdominal surgery Restrictions Weight Bearing Restrictions: No      Mobility  Bed Mobility Overal bed mobility: Needs Assistance Bed Mobility: Sit to Supine Rolling: Supervision Sidelying to sit: Min guard;HOB elevated   Sit to supine: Min assist Sit to sidelying: Supervision General bed mobility comments: min A LEs into bed    Transfers Overall transfer level: Needs assistance Equipment used: 1 person hand held assist Transfers: Sit to/from Stand Sit to Stand: Min guard Stand pivot transfers: Min guard       General transfer comment: patient reports feeling weaker than  baseline  Ambulation/Gait Ambulation/Gait assistance: Min guard Gait Distance (Feet): 15 Feet Assistive device: None Gait Pattern/deviations: Step-through pattern;Decreased stride length     General Gait Details: held onto door frame, wall, bedrail; distance limited by fatigue (she reports she ambulated in the hallway twice earlier today)  Stairs            Wheelchair Mobility    Modified Rankin (Stroke Patients Only)       Balance Overall balance assessment: Mild deficits observed, not formally tested                                           Pertinent Vitals/Pain Pain Assessment: Faces Faces Pain Scale: Hurts a little bit Pain Location: abdomen with mobility Pain Descriptors / Indicators: Sore;Tender Pain Intervention(s): Limited activity within patient's tolerance;Monitored during session;Repositioned    Home Living Family/patient expects to be discharged to:: Private residence Living Arrangements: Alone Available Help at Discharge: Family;Available PRN/intermittently Type of Home: House Home Access: Stairs to enter Entrance Stairs-Rails: Right Entrance Stairs-Number of Steps: 3-4 Home Layout: One level Home Equipment: Grab bars - tub/shower;Cane - single point;Walker - 2 wheels;Shower seat - built in Additional Comments: daughter can assist prn    Prior Function Level of Independence: Independent               Hand Dominance   Dominant Hand: Right    Extremity/Trunk Assessment   Upper Extremity Assessment Upper Extremity Assessment: Defer to OT evaluation    Lower Extremity Assessment Lower Extremity Assessment: Overall WFL for tasks assessed  Cervical / Trunk Assessment Cervical / Trunk Assessment: Normal  Communication   Communication: No difficulties  Cognition Arousal/Alertness: Awake/alert Behavior During Therapy: WFL for tasks assessed/performed Overall Cognitive Status: Within Functional Limits for tasks  assessed                                        General Comments      Exercises General Exercises - Lower Extremity Ankle Circles/Pumps: AROM;Both;10 reps;Supine Short Arc Quad: AROM;Both;10 reps;Supine   Assessment/Plan    PT Assessment Patient needs continued PT services  PT Problem List Decreased mobility;Decreased activity tolerance       PT Treatment Interventions Gait training;Therapeutic exercise;Patient/family education    PT Goals (Current goals can be found in the Care Plan section)  Acute Rehab PT Goals Patient Stated Goal: see my dog PT Goal Formulation: With patient/family Time For Goal Achievement: 11/15/20 Potential to Achieve Goals: Good    Frequency Min 3X/week   Barriers to discharge        Co-evaluation               AM-PAC PT "6 Clicks" Mobility  Outcome Measure Help needed turning from your back to your side while in a flat bed without using bedrails?: A Little Help needed moving from lying on your back to sitting on the side of a flat bed without using bedrails?: A Little Help needed moving to and from a bed to a chair (including a wheelchair)?: A Little Help needed standing up from a chair using your arms (e.g., wheelchair or bedside chair)?: A Little Help needed to walk in hospital room?: A Little Help needed climbing 3-5 steps with a railing? : A Little 6 Click Score: 18    End of Session   Activity Tolerance: Patient limited by fatigue;Patient tolerated treatment well Patient left: in bed;with call bell/phone within reach;with family/visitor present;with bed alarm set Nurse Communication: Mobility status PT Visit Diagnosis: Difficulty in walking, not elsewhere classified (R26.2);Pain    Time: 6226-3335 PT Time Calculation (min) (ACUTE ONLY): 17 min   Charges:   PT Evaluation $PT Eval Low Complexity: 1 Low         Blondell Reveal Kistler PT 11/01/2020  Acute Rehabilitation Services Pager  [phone] Office [phone]

## 2020-11-01 NOTE — Progress Notes (Addendum)
PROGRESS NOTE  JADEN BATCHELDER HYW:[id number] DOB: 05-18-1935   PCP: [name], PA-C  Patient is from: Home.  Very independent at baseline.  DOA: 10/26/2020 LOS: 5  Chief complaints: Anemia  Brief Narrative / Interim history: 86 year old F with PMH of A. fib on Eliquis and HTN presented to ED for anemia and shortness of breath, and admitted for blood loss anemia due to GI bleed.  Hgb 8.5 (reportedly 11 in January 2022 per daughter).  Hemoccult positive.  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  Colonoscopy on 5/13 confirmed malignant tumor adjacent to the IC valve. Pathology from proximal mass with invasive adenocarcinoma.  Pathology of polyp from transverse colon and splenic flexure with at least high-grade dysplasia. MRI abdomen with and without contrast without significant finding within the visualized portion of the abdomen. Patient underwent laparoscopic proximal hemicolectomy with reanastomosis on 5/16.   Anticipated to go home with home health on 5/19 per general surgery.  Subjective: Seen and examined earlier this morning.  No major events overnight of this morning.  She tolerated clear liquid diet.  She had 2 bowel movements after surgery.  Has already ambulated in the hallway without issue.  She denies chest pain, dyspnea, lightheadedness.   Objective: Vitals:   11/01/20 0058 11/01/20 0355 11/01/20 1011 11/01/20 1443  BP: 137/75 136/73  (!) 115/55  Pulse: 86 71  64  Resp:  16  16  Temp: 97.9 F (36.6 C) 97.7 F (36.5 C)  (!) 97.4 F (36.3 C)  TempSrc: Axillary Oral  Oral  SpO2: 100% 99% 100% 100%  Weight:      Height:        Intake/Output Summary (Last 24 hours) at 11/01/2020 1534 Last data filed at 11/01/2020 1339 Gross per 24 hour  Intake 1942.69 ml  Output 1545 ml  Net 397.69 ml   Filed Weights   10/26/20 1729  Weight: 66 kg    Examination:  GENERAL: No  apparent distress.  Nontoxic. HEENT: MMM.  Vision and hearing grossly intact.  NECK: Supple.  No apparent JVD.  RESP:  No IWOB.  Fair aeration bilaterally. CVS: Irregular rhythm.  Normal rate. Heart sounds normal.  ABD/GI/GU: BS+. Abd soft.  Honeycomb dressing over surgical wound.  Laparoscopic wounds clean MSK/EXT:  Moves extremities. No apparent deformity. No edema.  SKIN: no apparent skin lesion or wound NEURO: Awake and alert. Oriented appropriately.  No apparent focal neuro deficit. PSYCH: Calm. Normal affect.  Procedures:  5/13-Colonoscopy. See result above 5/16-laparoscopic proximal hemicolectomy with reanastomosis  Microbiology summarized: -COVID-19 and influenza PCR nonreactive.  Assessment & Plan: Severe iron deficiency anemia due to acute on chronic blood loss from GI bleed: FOBT positive.  Hgb 13.7 in 2019, and reportedly about 12 in January 2022 per daughter. 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 MRI as above.  Pathology from proximal mass with invasive adenocarcinoma.  Pathology of polyp from transverse colon and splenic flexure with at least high-grade dysplasia.  -S/p laparoscopic proximal hemicolectomy with reanastomosis by Dr. Johney Maine -General surgery managing -Follow surgical pathology  Paroxysmal A. fib: On Toprol-XL, Cardizem and Eliquis at home.  CHA2DS2-VASc score 4 (  age, sex, HTN) -Continue Toprol-XL and Cardizem -Resume Eliquis once okay from surgical perspective  Essential hypertension: Normotensive. -Continue metoprolol and diltiazem. -Continue holding Maxzide.  May consider resuming the morning  Hypokalemia/hypophosphatemia: Hypokalemia could be  due to HCTZ.  Resolved. -Recheck in the morning  Nutrition: Prealbumin 10.8. Body mass index is 24.98 kg/m. Nutrition Problem: Increased nutrient needs Etiology: acute illness Signs/Symptoms: estimated needs Interventions: Ensure Enlive (each supplement provides 350kcal and 20 grams of protein)   DVT prophylaxis:  enoxaparin (LOVENOX) injection 40 mg Start: 11/01/20 0800 SCDs Start: 10/26/20 2337  Code Status: Full code Family Communication: Updated patient's daughter over the phone Level of care: Telemetry Status is: Inpatient  Remains inpatient appropriate because:Inpatient level of care appropriate due to severity of illness pending clearance by surgery   Dispo: The patient is from: Home              Anticipated d/c is to: Home              Patient currently is not medically stable to d/c.   Difficult to place patient No       Consultants:  Gastroenterology General surgery   Sch Meds:  Scheduled Meds: . acetaminophen  1,000 mg Oral Q6H  . alvimopan  12 mg Oral BID  . atorvastatin  5 mg Oral Once per day on Mon Wed Fri  . Chlorhexidine Gluconate Cloth  6 each Topical Daily  . cholecalciferol  2,000 Units Oral QHS  . diltiazem  180 mg Oral Daily  . enoxaparin (LOVENOX) injection  40 mg Subcutaneous Q24H  . estradiol  0.5 mg Oral Daily  . feeding supplement  1 Container Oral BID BM  . ferrous sulfate  325 mg Oral BID WC  . metoprolol succinate  50 mg Oral BID  . multivitamin  1 tablet Oral Daily  . polycarbophil  625 mg Oral BID  . potassium chloride SA  20 mEq Oral Daily   Continuous Infusions: . lactated ringers    . methocarbamol (ROBAXIN) IV     PRN Meds:.alum & mag hydroxide-simeth, diphenoxylate-atropine, enalaprilat, HYDROmorphone (DILAUDID) injection, lactated ringers, LORazepam, melatonin, methocarbamol (ROBAXIN) IV, methocarbamol, metoprolol tartrate, ondansetron **OR** ondansetron (ZOFRAN) IV, prochlorperazine **OR** prochlorperazine, simethicone,  traMADol  Antimicrobials: Anti-infectives (From admission, onward)   Start     Dose/Rate Route Frequency Ordered Stop   10/31/20 2200  cefoTEtan (CEFOTAN) 2 g in sodium chloride 0.9 % 100 mL IVPB        2 g 200 mL/hr over 30 Minutes Intravenous Every 12 hours 10/31/20 1744 10/31/20 2205   10/31/20 0915  cefoTEtan (CEFOTAN) 2 g in sodium chloride 0.9 % 100 mL IVPB        2 g 200 mL/hr over 30 Minutes Intravenous On call to O.R. 10/31/20 1937 10/31/20 1312       I have personally reviewed the following labs and images: CBC: Recent Labs  Lab 10/26/20 1919 10/27/20 0551 10/28/20 0720 10/29/20 0626 10/30/20 0525 10/31/20 0525 11/01/20 0514  WBC 6.3 6.3 7.0 7.9 8.6  --  17.0*  NEUTROABS 3.9  --  4.8 5.5  --   --   --   HGB 8.5* 8.0* 9.0* 7.9* 8.4* 9.8* 10.0*  HCT 29.7* 27.8* 31.6* 28.0* 29.7* 33.2* 33.7*  MCV 75.0* 75.5* 75.4* 75.5* 76.2*  --  78.2*  PLT 353 305 362 340 344  --  294   BMP &GFR Recent Labs  Lab 10/27/20 0551 10/28/20 0720 10/29/20 9024 10/30/20 0525 10/31/20 0973  11/01/20 0514  NA 137 135 135 136  --  133*  K 3.2* 3.2* 3.0* 4.3 3.6 4.0  CL 105 104 101 104  --  100  CO2 27 24 26 27   --  24  GLUCOSE 100* 111* 105* 102*  --  158*  BUN 11 11 10 11   --  12  CREATININE 0.62 0.62 0.67 0.60  --  0.65  CALCIUM 9.6 10.2 9.9 9.9  --  9.3  MG  --  2.0  --  2.0 2.2 1.8  PHOS  --  2.3*  --  2.4* 3.1  --    Estimated Creatinine Clearance: 49.8 mL/min (by C-G formula based on SCr of 0.65 mg/dL). Liver & Pancreas: Recent Labs  Lab 10/26/20 1919 10/30/20 0525  AST 18  --   ALT 14  --   ALKPHOS 104  --   BILITOT 1.1  --   PROT 6.7  --   ALBUMIN 3.8 3.3*   Recent Labs  Lab 10/26/20 1919  LIPASE 31   No results for input(s): AMMONIA in the last 168 hours. Diabetic: No results for input(s): HGBA1C in the last 72 hours. No results for input(s): GLUCAP in the last 168 hours. Cardiac Enzymes: No results for input(s): CKTOTAL, CKMB, CKMBINDEX, TROPONINI in  the last 168 hours. No results for input(s): PROBNP in the last 8760 hours. Coagulation Profile: Recent Labs  Lab 10/26/20 1930  INR 1.4*   Thyroid Function Tests: No results for input(s): TSH, T4TOTAL, FREET4, T3FREE, THYROIDAB in the last 72 hours. Lipid Profile: No results for input(s): CHOL, HDL, LDLCALC, TRIG, CHOLHDL, LDLDIRECT in the last 72 hours. Anemia Panel: No results for input(s): VITAMINB12, FOLATE, FERRITIN, TIBC, IRON, RETICCTPCT in the last 72 hours. Urine analysis: No results found for: COLORURINE, APPEARANCEUR, LABSPEC, PHURINE, GLUCOSEU, HGBUR, BILIRUBINUR, KETONESUR, PROTEINUR, UROBILINOGEN, NITRITE, LEUKOCYTESUR Sepsis Labs: Invalid input(s): PROCALCITONIN, [place]  Microbiology: Recent Results (from the past 240 hour(s))  Resp Panel by RT-PCR (Flu A&B, Covid) Nasopharyngeal Swab     Status: None   Collection Time: 10/26/20 10:00 PM   Specimen: Nasopharyngeal Swab; Nasopharyngeal(NP) swabs in vial transport medium  Result Value Ref Range Status   SARS Coronavirus 2 by RT PCR NEGATIVE NEGATIVE Final    Comment: (NOTE) SARS-CoV-2 target nucleic acids are NOT DETECTED.  The SARS-CoV-2 RNA is generally detectable in upper respiratory specimens during the acute phase of infection. The lowest concentration of SARS-CoV-2 viral copies this assay can detect is 138 copies/mL. A negative result does not preclude SARS-Cov-2 infection and should not be used as the sole basis for treatment or other patient management decisions. A negative result may occur with  improper specimen collection/handling, submission of specimen other than nasopharyngeal swab, presence of viral mutation(s) within the areas targeted by this assay, and inadequate number of viral copies(<138 copies/mL). A negative result must be combined with clinical observations, patient history, and epidemiological information. The expected result is Negative.  Fact Sheet for Patients:   EntrepreneurPulse.com.au  Fact Sheet for Healthcare Providers:  IncredibleEmployment.be  This test is no t yet approved or cleared by the [place] FDA and  has been authorized for detection and/or diagnosis of SARS-CoV-2 by FDA under an Emergency Use Authorization (EUA). This EUA will remain  in effect (meaning this test can be used) for the duration of the COVID-19 declaration under Section 564(b)(1) of the Act, 21 U.S.C.section 360bbb-3(b)(1), unless the authorization is terminated  or revoked sooner.  Influenza A by PCR NEGATIVE NEGATIVE Final   Influenza B by PCR NEGATIVE NEGATIVE Final    Comment: (NOTE) The Xpert Xpress SARS-CoV-2/FLU/RSV plus assay is intended as an aid in the diagnosis of influenza from Nasopharyngeal swab specimens and should not be used as a sole basis for treatment. Nasal washings and aspirates are unacceptable for Xpert Xpress SARS-CoV-2/FLU/RSV testing.  Fact Sheet for Patients: EntrepreneurPulse.com.au  Fact Sheet for Healthcare Providers: IncredibleEmployment.be  This test is not yet approved or cleared by the [place] FDA and has been authorized for detection and/or diagnosis of SARS-CoV-2 by FDA under an Emergency Use Authorization (EUA). This EUA will remain in effect (meaning this test can be used) for the duration of the COVID-19 declaration under Section 564(b)(1) of the Act, 21 U.S.C. section 360bbb-3(b)(1), unless the authorization is terminated or revoked.  Performed at [facility], [place] [address], [place], [place] [address]   Surgical pcr screen     Status: None   Collection Time: 10/31/20 10:19 AM   Specimen: Nasal Mucosa; Nasal Swab  Result Value Ref Range Status   MRSA, PCR NEGATIVE NEGATIVE Final   Staphylococcus aureus NEGATIVE NEGATIVE Final    Comment: (NOTE) The Xpert SA Assay (FDA approved for NASAL specimens in  patients 61 years of age and older), is one component of a comprehensive surveillance program. It is not intended to diagnose infection nor to guide or monitor treatment. Performed at [facility], [place] [address] [place],  [address]     Radiology Studies: No results found.    Ammi Hutt T. Sparks  If 7PM-7AM, please contact night-coverage [email or web address] 11/01/2020, 3:34 PM

## 2020-11-01 NOTE — Progress Notes (Signed)
Robin [name] [id number] 11-Aug-1934  CARE TEAM:  PCP: [name], PA-C  Outpatient Care Team: Patient Care Team: [name], PA-C as PCP - General (Family Medicine) Skeet Latch, MD as PCP - Cardiology (Cardiology) Milus Banister, MD as Attending Physician (Gastroenterology) [name], MD as Consulting Physician (General Surgery)  Inpatient Treatment Team: Treatment Team: Attending Provider: Mercy Riding, MD; Rounding Team: Suzan Garibaldi, MD; Consulting Physician: Jerene Bears, MD; Rounding Team: Edison Pace, Md, MD; Registered Nurse: Constance Haw, RN; Registered Nurse: Adela Ports, RN; Technician: Duncan-Williams, [place] L, NT; Pharmacist: Randa Spike, [facility]   Problem List:   Principal Problem:   Cecal cancer & polyps s/p lap hemicolectomy 10/31/2020 Active Problems:   High grade dysplasia in colonic adenomas transverse colon & splenic flexure   Paroxysmal atrial fibrillation (HCC)   Chronic anticoagulation   Anemia   GI bleed   Colonic mass   Colon cancer ([place])   1 Day Post-Op  10/31/2020  POST-OPERATIVE DIAGNOSIS:   CECAL COLON CANCER TRANSVERSE COLON POLYPS WITH HIGH GRADE DYSPLASIA & POSSIBLE ADENOCARCINOMA IRON DEFICIENCY ANEMIA FROM LOWER GI BLOOD LOSS  PROCEDURE:   LAPAROSCOPIC PROXIMAL HEMICOLECTOMY  OMENTOPEXY OF ANASTOMOSIS LAPAROSCOPIC LYSIS OF ADHESIONS TRANSVERSUS ABDOMINIS PLANE (TAP) BLOCK - BILATERAL  SURGEON:  Adin Hector, MD  OR FINDINGS:   CASE DATA:  Type of patient?: LDOW CASE (Surgical Hospitalist [facility] Inpatient)  Status of Case? URGENT Add On  Infection Present At Time Of Surgery (PATOS)?  NO  Patient had moderately significant intra-abdominal adhesions from her prior open cholecystectomy as well as pelvic surgeries.  Obvious bulky mass in the cecum near the ileocecal valve.  Tattooing actually at the mid transverse colon.  She had a rather redundant stretched out colon.  Specimen opened  on the back table and clearly the tattooed region had some fibrosis and thickening through the colon wall suspicious for some residual cancer.  No obvious metastatic disease on visceral parietal peritoneum or liver.  It is an isoperistaltic anastomosis between terminal ileum and distal transverse colon resting in the left upper quadrant.   Assessment  Recovering relatively well  [facility] Stay = 5 days)  Plan:  Enhance recovery protocol.  Follow-up on pathology  Agree with full liquids/dysphagia diet.  Possible advance to soft diet if does well..  Stop IV fluids with as needed bolus backup.  DC Foley.  No need for telemetry from a surgery standpoint but will defer to primary medicine service given her history of atrial fibrillation.  If improves well and hemoglobin stable, can most likely reinstitute oral anticoagulation tomorrow, postop day 2  VTE prophylaxis- SCDs, etc. low-dose prophylactic enoxaparin for now.  If hemoglobin stable and continues to do well, can consider starting oral full anticoagulation tomorrow, postop day 2.  Follow hemoglobin and watch out for recurrent GI bleeding  mobilize as tolerated to help recovery.  Physical and Occupational Therapy evaluations per family and patient request to assess recovery and independence.  She was rather active so hopefully she can transition home with good family support.  Low threshold to have home health involved.  If has more significant declines then may need skilled facility.  Hopefully not as likely.  Fiber bowel regimen.  Anticipate more frequent bowel movements with almost half of the colon removed but she should gradually adapt back to her baseline of 1-2 bowel movements a day over the coming year.  Disposition:  Disposition:  The patient is from: Home  Anticipate discharge to:  Home with Home Health  Anticipated Date of Discharge is:  May 19,2022    Barriers to discharge:  Pending Clinical improvement (more  likely than not)  Patient currently is NOT MEDICALLY STABLE for discharge from the hospital from a surgery standpoint.      30 minutes spent in review, evaluation, examination, counseling, and coordination of care.   I have reviewed this patient's available data, including medical history, events of note, physical examination and test results as part of my evaluation.  A significant portion of that time was spent in counseling.  Care during the described time interval was provided by me.  11/01/2020    Subjective: (Chief complaint)  Tolerated liquids.  Went to chair and walked in hallways.  Nursing outside room and feels patient is recovering well.  Had some loose bowel movements.  Wants to try to advance diet.  Denies any pain which is a relief for her.  Overall she feels like she is doing quite well and is appreciative of care.  Objective:  Vital signs:  Vitals:   10/31/20 2053 10/31/20 2330 11/01/20 0058 11/01/20 0355  BP: (!) 154/86 (!) 149/84 137/75 136/73  Pulse: 97 95 86 71  Resp: 15   16  Temp: (!) 97.5 F (36.4 C) (!) 97.5 F (36.4 C) 97.9 F (36.6 C) 97.7 F (36.5 C)  TempSrc: Oral Axillary Axillary Oral  SpO2: 100% 100% 100% 99%  Weight:      Height:        Last BM Date: 10/31/20  Intake/Output   Yesterday:  05/16 0701 - 05/17 0700 In: 2702.7 [P.O.:180; I.V.:2172.7; IV Piggyback:350] Out: 1625 [Urine:1550; Blood:75] This shift:  No intake/output data recorded.  Bowel function:  Flatus: YES  BM:  YES  Drain: (No drain)   Physical Exam:  General: Pt awake/alert in no acute distress Eyes: PERRL, normal EOM.  Sclera clear.  No icterus Neuro: CN II-XII intact w/o focal sensory/motor deficits. Lymph: No head/neck/groin lymphadenopathy Psych:  No delerium/psychosis/paranoia.  Oriented x 4 HENT: Normocephalic, Mucus membranes moist.  No thrush Neck: Supple, No tracheal deviation.  No obvious thyromegaly Chest: No pain to chest wall  compression.  Good respiratory excursion.  No audible wheezing CV:  Pulses intact.  Regular rhythm.  No major extremity edema MS: Normal AROM mjr joints.  No obvious deformity  Abdomen: Soft.  Nondistended.  Nontender.  Dressings with minimal old blood.  Otherwise clean and intact.   no evidence of peritonitis.  No incarcerated hernias.  Ext:   No deformity.  No mjr edema.  No cyanosis Skin: No petechiae / purpurea.  No major sores.  Warm and dry    Results:   Cultures: Recent Results (from the past 720 hour(s))  Resp Panel by RT-PCR (Flu A&B, Covid) Nasopharyngeal Swab     Status: None   Collection Time: 10/26/20 10:00 PM   Specimen: Nasopharyngeal Swab; Nasopharyngeal(NP) swabs in vial transport medium  Result Value Ref Range Status   SARS Coronavirus 2 by RT PCR NEGATIVE NEGATIVE Final    Comment: (NOTE) SARS-CoV-2 target nucleic acids are NOT DETECTED.  The SARS-CoV-2 RNA is generally detectable in upper respiratory specimens during the acute phase of infection. The lowest concentration of SARS-CoV-2 viral copies this assay can detect is 138 copies/mL. A negative result does not preclude SARS-Cov-2 infection and should not be used as the sole basis for treatment or other patient management decisions. A negative result may occur with  improper specimen collection/handling, submission of specimen  other than nasopharyngeal swab, presence of viral mutation(s) within the areas targeted by this assay, and inadequate number of viral copies(<138 copies/mL). A negative result must be combined with clinical observations, patient history, and epidemiological information. The expected result is Negative.  Fact Sheet for Patients:  EntrepreneurPulse.com.au  Fact Sheet for Healthcare Providers:  IncredibleEmployment.be  This test is no t yet approved or cleared by the [place] FDA and  has been authorized for detection and/or diagnosis of  SARS-CoV-2 by FDA under an Emergency Use Authorization (EUA). This EUA will remain  in effect (meaning this test can be used) for the duration of the COVID-19 declaration under Section 564(b)(1) of the Act, 21 U.S.C.section 360bbb-3(b)(1), unless the authorization is terminated  or revoked sooner.       Influenza A by PCR NEGATIVE NEGATIVE Final   Influenza B by PCR NEGATIVE NEGATIVE Final    Comment: (NOTE) The Xpert Xpress SARS-CoV-2/FLU/RSV plus assay is intended as an aid in the diagnosis of influenza from Nasopharyngeal swab specimens and should not be used as a sole basis for treatment. Nasal washings and aspirates are unacceptable for Xpert Xpress SARS-CoV-2/FLU/RSV testing.  Fact Sheet for Patients: EntrepreneurPulse.com.au  Fact Sheet for Healthcare Providers: IncredibleEmployment.be  This test is not yet approved or cleared by the [place] FDA and has been authorized for detection and/or diagnosis of SARS-CoV-2 by FDA under an Emergency Use Authorization (EUA). This EUA will remain in effect (meaning this test can be used) for the duration of the COVID-19 declaration under Section 564(b)(1) of the Act, 21 U.S.C. section 360bbb-3(b)(1), unless the authorization is terminated or revoked.  Performed at [facility], [place] [address] [place], [place] [address]   Surgical pcr screen     Status: None   Collection Time: 10/31/20 10:19 AM   Specimen: Nasal Mucosa; Nasal Swab  Result Value Ref Range Status   MRSA, PCR NEGATIVE NEGATIVE Final   Staphylococcus aureus NEGATIVE NEGATIVE Final    Comment: (NOTE) The Xpert SA Assay (FDA approved for NASAL specimens in patients 79 years of age and older), is one component of a comprehensive surveillance program. It is not intended to diagnose infection nor to guide or monitor treatment. Performed at [facility], [place] [address] [place], [place]  [address]     Labs: Results for orders placed or performed during the hospital encounter of 10/26/20 (from the past 48 hour(s))  Type and screen [place]     Status: None   Collection Time: 10/30/20 10:03 AM  Result Value Ref Range   ABO/RH(D) O POS    Antibody Screen NEG    Sample Expiration 11/02/2020,2359    Unit Number [id number]    Blood Component Type RED CELLS,LR    Unit division 00    Status of Unit ISSUED,FINAL    Transfusion Status OK TO TRANSFUSE    Crossmatch Result      Compatible Performed at [facility], [place] [address] [place], [place] [address]   Hemoglobin and hematocrit, blood     Status: Abnormal   Collection Time: 10/31/20  5:25 AM  Result Value Ref Range   Hemoglobin 9.8 (L) 12.0 - 15.0 g/dL   HCT 33.2 (L) 36.0 - 46.0 %    Comment: Performed at [facility], [place] [address], [place],  [address]  Magnesium     Status: None   Collection Time: 10/31/20  5:25 AM  Result Value Ref Range  Magnesium 2.2 1.7 - 2.4 mg/dL    Comment: Performed at [facility], [place] [address], [place], [place] [address]  Potassium     Status: None   Collection Time: 10/31/20  5:25 AM  Result Value Ref Range   Potassium 3.6 3.5 - 5.1 mmol/L    Comment: Performed at [facility], [place] [address], [place], [place] [address]  Phosphorus     Status: None   Collection Time: 10/31/20  5:25 AM  Result Value Ref Range   Phosphorus 3.1 2.5 - 4.6 mg/dL    Comment: Performed at [facility], [place] [address] [place], [place] [address]  Surgical pcr screen     Status: None   Collection Time: 10/31/20 10:19 AM   Specimen: Nasal Mucosa; Nasal Swab  Result Value Ref Range   MRSA, PCR NEGATIVE NEGATIVE   Staphylococcus aureus NEGATIVE NEGATIVE    Comment: (NOTE) The Xpert SA Assay (FDA approved for NASAL specimens in patients 94 years of age and older), is one component  of a comprehensive surveillance program. It is not intended to diagnose infection nor to guide or monitor treatment. Performed at [facility], [place] [address] [place], [place] [address]   Basic metabolic panel     Status: Abnormal   Collection Time: 11/01/20  5:14 AM  Result Value Ref Range   Sodium 133 (L) 135 - 145 mmol/L   Potassium 4.0 3.5 - 5.1 mmol/L   Chloride 100 98 - 111 mmol/L   CO2 24 22 - 32 mmol/L   Glucose, Bld 158 (H) 70 - 99 mg/dL    Comment: Glucose reference range applies only to samples taken after fasting for at least 8 hours.   BUN 12 8 - 23 mg/dL   Creatinine, Ser 0.65 0.44 - 1.00 mg/dL   Calcium 9.3 8.9 - 10.3 mg/dL   GFR, Estimated >60 >60 mL/min    Comment: (NOTE) Calculated using the CKD-EPI Creatinine Equation (2021)    Anion gap 9 5 - 15    Comment: Performed at [facility], [place] [address] [place], [place] [address]  CBC     Status: Abnormal   Collection Time: 11/01/20  5:14 AM  Result Value Ref Range   WBC 17.0 (H) 4.0 - 10.5 K/uL   RBC 4.31 3.87 - 5.11 MIL/uL   Hemoglobin 10.0 (L) 12.0 - 15.0 g/dL   HCT 33.7 (L) 36.0 - 46.0 %   MCV 78.2 (L) 80.0 - 100.0 fL   MCH 23.2 (L) 26.0 - 34.0 pg   MCHC 29.7 (L) 30.0 - 36.0 g/dL   RDW 18.8 (H) 11.5 - 15.5 %   Platelets 294 150 - 400 K/uL   nRBC 0.0 0.0 - 0.2 %    Comment: Performed at [facility], [place] [address], [name], [place] [address]  Magnesium     Status: None   Collection Time: 11/01/20  5:14 AM  Result Value Ref Range   Magnesium 1.8 1.7 - 2.4 mg/dL    Comment: Performed at [facility], [place] [address], [place], [place] [address]    Imaging / Studies: No results found.  Medications / Allergies: per chart  Antibiotics: Anti-infectives (From admission, onward)   Start     Dose/Rate Route Frequency Ordered Stop   10/31/20 2200  cefoTEtan (CEFOTAN) 2 g in sodium chloride 0.9 % 100 mL IVPB        2 g 200  mL/hr  over 30 Minutes Intravenous Every 12 hours 10/31/20 1744 10/31/20 2205   10/31/20 0915  cefoTEtan (CEFOTAN) 2 g in sodium chloride 0.9 % 100 mL IVPB        2 g 200 mL/hr over 30 Minutes Intravenous On call to O.R. 10/31/20 0824 10/31/20 1312        Note: Portions of this report may have been transcribed using voice recognition software. Every effort was made to ensure accuracy; however, inadvertent computerized transcription errors may be present.   Any transcriptional errors that result from this process are unintentional.    Adin Hector, MD, FACS, MASCRS  Esophageal, Gastrointestinal & Colorectal Surgery Robotic and Minimally Invasive Surgery [facility] [address] [place], [place] [address] [phone] Fax [phone] Main/Paging  CONTACT INFORMATION: Weekday (9AM-5PM) concerns: Call [facility] main office at [phone] Weeknight (5PM-9AM) or Weekend/Holiday concerns: Check [email or web address] for General Surgery [facility] coverage (Please, do not use SecureChat as it is not reliable communication to operating surgeons for immediate patient care)      11/01/2020  7:59 AM

## 2020-11-01 NOTE — Progress Notes (Signed)
Pt able to walk a few steps in room; from bed to bathroom and back; pt reports no dizziness or weakness; pt had a loose bowel movement; dark brown, about 153ml  with sign of bright yellow staining fluid.

## 2020-11-01 NOTE — Evaluation (Signed)
Occupational Therapy Evaluation Patient Details Name: Robin [name] MRN: [id number] DOB: 30-Aug-1934 Today's Date: 11/01/2020    History of Present Illness 86 year old F presented to ED for anemia and shortness of breath, and admitted for blood loss anemia due to GI bleed.  Hgb 8.5 (reportedly 11 in January 2022 per daughter).  Hemoccult positive.  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 s/p laparoscopic hemicolectomy 10/31/20. Pt  with PMH of A. fib on Eliquis and HTN.   Clinical Impression   Patient lives home alone in a single level house with 3-4 steps to enter. Cares for her 110 pound lab, very independent at baseline. Educated patient in proper body mechanics and compensatory strategies in order to reduce abdominal strain post surgery. Able to perform log roll technique for bed mobility with min cues and min G assist. Patient also demo figure 4 method for lower body dressing with increased time to doff/don sock. Overall patient min G level for functional transfer to bedside commode then recliner, reports feeling weaker than her baseline. Recommend continued acute OT services to maximize patient overall endurance and safety in order to D/C home safely.     Follow Up Recommendations  Home health OT;Supervision - Intermittent    Equipment Recommendations  None recommended by OT       Precautions / Restrictions Precautions Precautions: Fall Restrictions Weight Bearing Restrictions: No      Mobility Bed Mobility Overal bed mobility: Needs Assistance Bed Mobility: Rolling;Sidelying to Sit;Sit to Sidelying Rolling: Supervision Sidelying to sit: Min guard;HOB elevated     Sit to sidelying: Supervision General bed mobility comments: educated patient on log roll technique to minimize abdominal strain; min cues for technique and patient able to demo for in and out of bed. reports less tenderness  "that really helped"    Transfers Overall transfer level: Needs assistance Equipment used: 1 person hand held assist Transfers: Sit to/from Stand;Stand Pivot Transfers Sit to Stand: Min guard Stand pivot transfers: Min guard       General transfer comment: patient reports feeling weaker than baseline, hand held assist initially then able to pivot to bedside commode, then recliner at min G level for safety    Balance Overall balance assessment: Mild deficits observed, not formally tested                                         ADL either performed or assessed with clinical judgement   ADL Overall ADL's : Needs assistance/impaired Eating/Feeding: Independent;Sitting   Grooming: Wash/dry hands;Supervision/safety;Standing   Upper Body Bathing: Supervision/ safety;Set up;Sitting   Lower Body Bathing: Min guard;Sit to/from stand   Upper Body Dressing : Set up;Sitting   Lower Body Dressing: Min guard;Sitting/lateral leans;Sit to/from stand Lower Body Dressing Details (indicate cue type and reason): educate patient on using figure 4 technique for lower body ADLs to minimize strain to abdomin, patient return demo to doff/don sock Toilet Transfer: Min Statistician Details (indicate cue type and reason): min G for safety, patient reports feeling weaker than baseline however no physical assistance on/off commode Toileting- Clothing Manipulation and Hygiene: Set up;Sitting/lateral lean       Functional mobility during ADLs: Min guard General ADL Comments: educated patient in home modifications as well as proper body mechanics in order to reduce abdominal strain in home environment, verbalize understanding and appreciative  Pertinent Vitals/Pain Pain Assessment: Faces Faces Pain Scale: Hurts a little bit Pain Location: abdomen with mobility Pain Descriptors / Indicators: Sore;Tender Pain Intervention(s): Monitored  during session     Hand Dominance Right   Extremity/Trunk Assessment Upper Extremity Assessment Upper Extremity Assessment: Overall WFL for tasks assessed   Lower Extremity Assessment Lower Extremity Assessment: Defer to PT evaluation       Communication Communication Communication: No difficulties   Cognition Arousal/Alertness: Awake/alert Behavior During Therapy: WFL for tasks assessed/performed Overall Cognitive Status: Within Functional Limits for tasks assessed                                                Home Living Family/patient expects to be discharged to:: Private residence Living Arrangements: Alone Available Help at Discharge: Family Type of Home: House Home Access: Stairs to enter Technical brewer of Steps: 3-4 Entrance Stairs-Rails: Right Home Layout: One level     Bathroom Shower/Tub: Occupational psychologist: Handicapped height     Home Equipment: Grab bars - tub/shower;Cane - single point;Walker - 2 wheels;Shower seat - built in          Prior Functioning/Environment Level of Independence: Independent                 OT Problem List: Pain;Decreased activity tolerance;Impaired balance (sitting and/or standing);Decreased safety awareness;Decreased knowledge of use of DME or AE      OT Treatment/Interventions: Self-care/ADL training;DME and/or AE instruction;Therapeutic activities;Patient/family education;Balance training    OT Goals(Current goals can be found in the care plan section) Acute Rehab OT Goals Patient Stated Goal: see my dog OT Goal Formulation: With patient Time For Goal Achievement: 11/15/20 Potential to Achieve Goals: Good  OT Frequency: Min 2X/week    AM-PAC OT "6 Clicks" Daily Activity     Outcome Measure Help from another person eating meals?: None Help from another person taking care of personal grooming?: A Little Help from another person toileting, which includes using toliet,  bedpan, or urinal?: A Little Help from another person bathing (including washing, rinsing, drying)?: A Little Help from another person to put on and taking off regular upper body clothing?: A Little Help from another person to put on and taking off regular lower body clothing?: A Little 6 Click Score: 19   End of Session Nurse Communication: Mobility status  Activity Tolerance: Patient tolerated treatment well Patient left: in chair;with call bell/phone within reach;with family/visitor present  OT Visit Diagnosis: Pain;Other abnormalities of gait and mobility (R26.89);Unsteadiness on feet (R26.81) Pain - part of body:  (abdomen)                Time: 8101-7510 OT Time Calculation (min): 31 min Charges:  OT General Charges $OT Visit: 1 Visit OT Evaluation $OT Eval Low Complexity: 1 Low OT Treatments $Self Care/Home Management : 8-22 mins  Delbert Phenix OT OT pager: [place] 11/01/2020, 2:09 PM

## 2020-11-02 ENCOUNTER — Inpatient Hospital Stay (HOSPITAL_COMMUNITY): Payer: Medicare PPO

## 2020-11-02 DIAGNOSIS — C180 Malignant neoplasm of cecum: Secondary | ICD-10-CM

## 2020-11-02 LAB — CBC
HCT: 34.7 % — ABNORMAL LOW (ref 36.0–46.0)
Hemoglobin: 10.5 g/dL — ABNORMAL LOW (ref 12.0–15.0)
MCH: 23.1 pg — ABNORMAL LOW (ref 26.0–34.0)
MCHC: 30.3 g/dL (ref 30.0–36.0)
MCV: 76.4 fL — ABNORMAL LOW (ref 80.0–100.0)
Platelets: 369 10*3/uL (ref 150–400)
RBC: 4.54 MIL/uL (ref 3.87–5.11)
RDW: 21.2 % — ABNORMAL HIGH (ref 11.5–15.5)
WBC: 23.2 10*3/uL — ABNORMAL HIGH (ref 4.0–10.5)
nRBC: 0 % (ref 0.0–0.2)

## 2020-11-02 LAB — RENAL FUNCTION PANEL
Albumin: 3.4 g/dL — ABNORMAL LOW (ref 3.5–5.0)
Anion gap: 8 (ref 5–15)
BUN: 17 mg/dL (ref 8–23)
CO2: 27 mmol/L (ref 22–32)
Calcium: 9.8 mg/dL (ref 8.9–10.3)
Chloride: 96 mmol/L — ABNORMAL LOW (ref 98–111)
Creatinine, Ser: 0.7 mg/dL (ref 0.44–1.00)
GFR, Estimated: >60 mL/min (ref >60)
Glucose, Bld: 137 mg/dL — ABNORMAL HIGH (ref 70–99)
Phosphorus: 1.8 mg/dL — ABNORMAL LOW (ref 2.5–4.6)
Potassium: 3.7 mmol/L (ref 3.5–5.1)
Sodium: 131 mmol/L — ABNORMAL LOW (ref 135–145)

## 2020-11-02 LAB — MAGNESIUM: Magnesium: 2.2 mg/dL (ref 1.7–2.4)

## 2020-11-02 MED ORDER — LACTATED RINGERS IV SOLN: INTRAVENOUS | Status: DC

## 2020-11-02 MED ORDER — HYDROMORPHONE HCL 1 MG/ML IJ SOLN: 0.5000 mg | INTRAMUSCULAR | Status: DC | PRN

## 2020-11-02 MED ORDER — K PHOS MONO-SOD PHOS DI & MONO 155-852-130 MG PO TABS
500.0000 mg | ORAL_TABLET | Freq: Four times a day (QID) | ORAL | Status: DC
  Filled 2020-11-02 (×3): qty 2

## 2020-11-02 MED ORDER — PHENOL 1.4 % MT LIQD
1.0000 | OROMUCOSAL | Status: DC | PRN
  Administered 2020-11-02: 1 via OROMUCOSAL
  Filled 2020-11-02: qty 177

## 2020-11-02 MED ORDER — LACTATED RINGERS IV BOLUS
1000.0000 mL | Freq: Once | INTRAVENOUS | Status: AC
  Administered 2020-11-02: 1000 mL via INTRAVENOUS

## 2020-11-02 MED ORDER — LORAZEPAM 2 MG/ML IJ SOLN
0.2500 mg | Freq: Once | INTRAMUSCULAR | Status: AC | PRN
  Administered 2020-11-02: 0.25 mg via INTRAVENOUS
  Filled 2020-11-02: qty 1

## 2020-11-02 MED ORDER — METHOCARBAMOL 1000 MG/10ML IJ SOLN
1000.0000 mg | Freq: Three times a day (TID) | INTRAVENOUS | Status: DC | PRN
  Filled 2020-11-02: qty 10

## 2020-11-02 MED ORDER — LACTATED RINGERS IV BOLUS
500.0000 mL | Freq: Once | INTRAVENOUS | Status: AC
  Administered 2020-11-02: 500 mL via INTRAVENOUS

## 2020-11-02 MED ORDER — METHOCARBAMOL 500 MG PO TABS: 1000.0000 mg | ORAL_TABLET | Freq: Three times a day (TID) | ORAL | Status: DC | PRN

## 2020-11-02 MED ORDER — LACTATED RINGERS IV BOLUS: 1000.0000 mL | Freq: Three times a day (TID) | INTRAVENOUS | Status: AC | PRN

## 2020-11-02 MED ORDER — SIMETHICONE 80 MG PO CHEW
40.0000 mg | CHEWABLE_TABLET | Freq: Four times a day (QID) | ORAL | Status: DC
  Administered 2020-11-03: 40 mg via ORAL
  Filled 2020-11-02 (×5): qty 1

## 2020-11-02 NOTE — Progress Notes (Signed)
PROGRESS NOTE    Robin [name]  VOH:[id number] DOB: 1935-04-12 DOA: 10/26/2020 PCP: [name], PA-C   No chief complaint on file.  Brief Narrative:  86 year old F with PMH of Robin [name]. fib on Eliquis and HTN presented to ED for anemia and shortness of breath, and admitted for blood loss anemia due to GI bleed.  Hgb 8.5 (reportedly 11 in January 2022 per daughter).  Hemoccult positive.  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  Colonoscopy on 5/13 confirmed malignant tumor adjacent to the IC valve. Pathology from proximal mass with invasive adenocarcinoma.  Pathology of polyp from transverse colon and splenic flexure with at least high-grade dysplasia. MRI abdomen with and without contrast without significant finding within the visualized portion of the abdomen. Patient underwent laparoscopic proximal hemicolectomy with reanastomosis on 5/16.  Post op treatment now per surgery.  Assessment & Plan:   Principal Problem:   Cecal cancer & polyps s/p lap hemicolectomy 10/31/2020 Active Problems:   Paroxysmal atrial fibrillation (HCC)   Chronic anticoagulation   Anemia   GI bleed   Colonic mass   High grade dysplasia in colonic adenomas transverse colon & splenic flexure   Colon cancer (HCC)  Adenocarcinoma of proximal colon with ileocecal adenopathy: Noted on CT abdomen and pelvis and colonoscopy. MRI as above.  Pathology from proximal mass with invasive adenocarcinoma.  Pathology of polyp from transverse colon and splenic flexure with at least high-grade dysplasia.  -S/p laparoscopic proximal hemicolectomy with reanastomosis by Dr. Johney Maine on 5/16 -General surgery managing -Follow surgical pathology  Nausea  Vomiting - c/o nausea this AM, follow with antiemetics, add dose of ativan - clear liquid diet for now and then per surgery - episode of emesis x1 last night, continue to monitor for now  Severe  iron deficiency anemia due to acute on chronic blood loss from GI bleed: FOBT positive.  Hgb 13.7 in 2019, and reportedly about 12 in January 2022 per daughter. - Hb relatively stable today, follow - labs c/w iron def anemia, low normal B12 (follow MMA)  Paroxysmal Robin [name]. Fib with RVR: On Toprol-XL, Cardizem and Eliquis at home.  CHA2DS2-VASc score 4 (age, sex, HTN) -Continue Toprol-XL and Cardizem -tachycardia noted today, mild, ? 2/2 emesis and volume depletion, follow with bolus -Resume Eliquis once okay from surgical perspective  Essential hypertension: Normotensive. -Continue metoprolol and diltiazem. -Continue holding Maxzide for now  Hypokalemia/hypophosphatemia: Hypokalemia could be due to HCTZ.  Resolved. -Recheck in the morning and follow and replace  Leukocytosis - reactive? Continue to monitor  DVT prophylaxis: lovenox Code Status: full Family Communication: daughter over phone in room Disposition:   Status is: Inpatient  Remains inpatient appropriate because:Inpatient level of care appropriate due to severity of illness   Dispo: The patient is from: Home              Anticipated d/c is to: pending              Patient currently is not medically stable to d/c.   Difficult to place patient No       Consultants:   Surgery  GI  Procedures:  5/16 PROCEDURE:   LAPAROSCOPIC PROXIMAL HEMICOLECTOMY  OMENTOPEXY OF ANASTOMOSIS LAPAROSCOPIC LYSIS OF ADHESIONS TRANSVERSUS ABDOMINIS PLANE (TAP) BLOCK - BILATERAL  Colonoscopy - Obvious malignant tumor adjacent to the IC valve. Biopsied. - Several polyps were also removed (mixed snare cautery and cold snare). The largest was located at the presumed splenic  flexure, removed piecemeal, and although it was soft and not overtly malignant I labled the site with submucosal Spot injections given it's size (2.5cm). - Return patient to hospital ward for ongoing care. - Will need staging tests with CT scan chest, abd,  pelvis. CEA. - I recommend continuing to hold eliquis for now. Await pathology results. - General surgery has already been consulted. - [facility] will return to see her on Monday. Drs. Collene Mares and Benson Norway are covering this weekend if needed.  Antimicrobials: Anti-infectives (From admission, onward)   Start     Dose/Rate Route Frequency Ordered Stop   10/31/20 2200  cefoTEtan (CEFOTAN) 2 g in sodium chloride 0.9 % 100 mL IVPB        2 g 200 mL/hr over 30 Minutes Intravenous Every 12 hours 10/31/20 1744 10/31/20 2205   10/31/20 0915  cefoTEtan (CEFOTAN) 2 g in sodium chloride 0.9 % 100 mL IVPB        2 g 200 mL/hr over 30 Minutes Intravenous On call to O.R. 10/31/20 0824 10/31/20 1312        Subjective: C/o nausea/vomting, tachycardia overnight Continued nausea now, vomited x1 Maybe after soft diet started  Objective: Vitals:   11/01/20 1011 11/01/20 1443 11/01/20 2012 11/02/20 0627  BP:  (!) 115/55 (!) 143/79 (!) 149/92  Pulse:  64 90 97  Resp:  16 16 18   Temp:  (!) 97.4 F (36.3 C) 98.2 F (36.8 C) 97.9 F (36.6 C)  TempSrc:  Oral Oral Oral  SpO2: 100% 100% 96% 97%  Weight:      Height:        Intake/Output Summary (Last 24 hours) at 11/02/2020 0904 Last data filed at 11/01/2020 2305 Gross per 24 hour  Intake 480 ml  Output 570 ml  Net -90 ml   Filed Weights   10/26/20 1729  Weight: 66 kg    Examination:  General exam: Appears calm and comfortable  Respiratory system: Clear to auscultation. Respiratory effort normal. Cardiovascular system: irregularly irregular, tachycardic Gastrointestinal system: distended, appropriately diffusely tender, surgical dressings in place Central nervous system: Alert and oriented. No focal neurological deficits. Extremities: no LEE Skin: No rashes, lesions or ulcers Psychiatry: Judgement and insight appear normal. Mood & affect appropriate.     Data Reviewed: I have personally reviewed following labs and imaging  studies  CBC: Recent Labs  Lab 10/26/20 1919 10/27/20 0551 10/28/20 0720 10/29/20 0626 10/30/20 0525 10/31/20 0525 11/01/20 0514 11/02/20 0508  WBC 6.3   < > 7.0 7.9 8.6  --  17.0* 23.2*  NEUTROABS 3.9  --  4.8 5.5  --   --   --   --   HGB 8.5*   < > 9.0* 7.9* 8.4* 9.8* 10.0* 10.5*  HCT 29.7*   < > 31.6* 28.0* 29.7* 33.2* 33.7* 34.7*  MCV 75.0*   < > 75.4* 75.5* 76.2*  --  78.2* 76.4*  PLT 353   < > 362 340 344  --  294 369   < > = values in this interval not displayed.    Basic Metabolic Panel: Recent Labs  Lab 10/28/20 0720 10/29/20 0626 10/30/20 0525 10/31/20 0525 11/01/20 0514 11/02/20 0508  NA 135 135 136  --  133* 131*  K 3.2* 3.0* 4.3 3.6 4.0 3.7  CL 104 101 104  --  100 96*  CO2 24 26 27   --  24 27  GLUCOSE 111* 105* 102*  --  158* 137*  BUN 11  10 11  --  12 17  CREATININE 0.62 0.67 0.60  --  0.65 0.70  CALCIUM 10.2 9.9 9.9  --  9.3 9.8  MG 2.0  --  2.0 2.2 1.8 2.2  PHOS 2.3*  --  2.4* 3.1  --  1.8*    GFR: Estimated Creatinine Clearance: 49.8 mL/min (by C-G formula based on SCr of 0.7 mg/dL).  Liver Function Tests: Recent Labs  Lab 10/26/20 1919 10/30/20 0525 11/02/20 0508  AST 18  --   --   ALT 14  --   --   ALKPHOS 104  --   --   BILITOT 1.1  --   --   PROT 6.7  --   --   ALBUMIN 3.8 3.3* 3.4*    CBG: No results for input(s): GLUCAP in the last 168 hours.   Recent Results (from the past 240 hour(s))  Resp Panel by RT-PCR (Flu Zayquan Bogard&B, Covid) Nasopharyngeal Swab     Status: None   Collection Time: 10/26/20 10:00 PM   Specimen: Nasopharyngeal Swab; Nasopharyngeal(NP) swabs in vial transport medium  Result Value Ref Range Status   SARS Coronavirus 2 by RT PCR NEGATIVE NEGATIVE Final    Comment: (NOTE) SARS-CoV-2 target nucleic acids are NOT DETECTED.  The SARS-CoV-2 RNA is generally detectable in upper respiratory specimens during the acute phase of infection. The lowest concentration of SARS-CoV-2 viral copies this assay can detect  is 138 copies/mL. Fatoumata Albaugh negative result does not preclude SARS-Cov-2 infection and should not be used as the sole basis for treatment or other patient management decisions. Haakon Titsworth negative result may occur with  improper specimen collection/handling, submission of specimen other than nasopharyngeal swab, presence of viral mutation(s) within the areas targeted by this assay, and inadequate number of viral copies(<138 copies/mL). Lashaunta Sicard negative result must be combined with clinical observations, patient history, and epidemiological information. The expected result is Negative.  Fact Sheet for Patients:  EntrepreneurPulse.com.au  Fact Sheet for Healthcare Providers:  IncredibleEmployment.be  This test is no t yet approved or cleared by the [place] FDA and  has been authorized for detection and/or diagnosis of SARS-CoV-2 by FDA under an Emergency Use Authorization (EUA). This EUA will remain  in effect (meaning this test can be used) for the duration of the COVID-19 declaration under Section 564(b)(1) of the Act, 21 U.S.C.section 360bbb-3(b)(1), unless the authorization is terminated  or revoked sooner.       Influenza Dashley Monts by PCR NEGATIVE NEGATIVE Final   Influenza B by PCR NEGATIVE NEGATIVE Final    Comment: (NOTE) The Xpert Xpress SARS-CoV-2/FLU/RSV plus assay is intended as an aid in the diagnosis of influenza from Nasopharyngeal swab specimens and should not be used as Niko Jakel sole basis for treatment. Nasal washings and aspirates are unacceptable for Xpert Xpress SARS-CoV-2/FLU/RSV testing.  Fact Sheet for Patients: EntrepreneurPulse.com.au  Fact Sheet for Healthcare Providers: IncredibleEmployment.be  This test is not yet approved or cleared by the [place] FDA and has been authorized for detection and/or diagnosis of SARS-CoV-2 by FDA under an Emergency Use Authorization (EUA). This EUA will remain in effect  (meaning this test can be used) for the duration of the COVID-19 declaration under Section 564(b)(1) of the Act, 21 U.S.C. section 360bbb-3(b)(1), unless the authorization is terminated or revoked.  Performed at [facility], [place] [address], [place], [place] [address]   Surgical pcr screen     Status: None   Collection Time: 10/31/20 10:19 AM   Specimen:  Nasal Mucosa; Nasal Swab  Result Value Ref Range Status   MRSA, PCR NEGATIVE NEGATIVE Final   Staphylococcus aureus NEGATIVE NEGATIVE Final    Comment: (NOTE) The Xpert SA Assay (FDA approved for NASAL specimens in patients 16 years of age and older), is one component of Azucena Dart comprehensive surveillance program. It is not intended to diagnose infection nor to guide or monitor treatment. Performed at [facility], [place] [address], [place], [place] [address]          Radiology Studies: No results found.      Scheduled Meds: . acetaminophen  1,000 mg Oral Q6H  . atorvastatin  5 mg Oral Once per day on Mon Wed Fri  . Chlorhexidine Gluconate Cloth  6 each Topical Daily  . cholecalciferol  2,000 Units Oral QHS  . diltiazem  180 mg Oral Daily  . enoxaparin (LOVENOX) injection  40 mg Subcutaneous Q24H  . estradiol  0.5 mg Oral Daily  . feeding supplement  1 Container Oral BID BM  . ferrous sulfate  325 mg Oral BID WC  . metoprolol succinate  50 mg Oral BID  . multivitamin  1 tablet Oral Daily  . polycarbophil  625 mg Oral BID  . potassium chloride SA  20 mEq Oral Daily   Continuous Infusions: . lactated ringers    . methocarbamol (ROBAXIN) IV       LOS: 6 days    Time spent: over 30 min    Fayrene Helper, MD [facility]   To contact the attending provider between 7A-7P or the covering provider during after hours 7P-7A, please log into the web site [email or web address] and access using universal [place] password for that web site. If you do not have the password, please  call the hospital operator.  11/02/2020, 9:04 AM

## 2020-11-02 NOTE — Progress Notes (Signed)
Physical Therapy Treatment Patient Details Name: Robin [name] MRN: [id number] DOB: 12/25/1934 Today's Date: 11/02/2020    History of Present Illness 86 year old F presented to ED for anemia and shortness of breath, and admitted for blood loss anemia due to GI bleed.  Hgb 8.5 (reportedly 11 in January 2022 per daughter).  Hemoccult positive.  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 s/p laparoscopic hemicolectomy 10/31/20. Pt  with PMH of A. fib on Eliquis and HTN.    PT Comments    Pt limited with ambulation to bathroom then back to bed. Min guard to steady with HHA with ambulation, pt declines RW, slightly unsteady and reaching for furniture/walls. Once seated EOB pt agreeable to BLE exercise, but becomes more nauseas after 2 exercises and requests to return to supine. Cued pt through log rolling to sit up and return to supine. Session limited by nausea and nausea medication making pt feel "loopy"- RN notified. Will continue to progress as able.   Follow Up Recommendations  No PT follow up     Equipment Recommendations  None recommended by PT    Recommendations for Other Services       Precautions / Restrictions Precautions Precautions: Other (comment) Precaution Comments: abdominal surgery Restrictions Weight Bearing Restrictions: No    Mobility  Bed Mobility Overal bed mobility: Needs Assistance Bed Mobility: Sidelying to Sit;Sit to Sidelying  Sidelying to sit: Min guard;HOB elevated  Sit to sidelying: Min assist General bed mobility comments: min G to upright trunk with HOB elevated, using log roll technique, min A for sit to sidelying to lift BLE back into bed and prevent from sliding off, limited by nausea    Transfers Overall transfer level: Needs assistance Equipment used: 1 person hand held assist Transfers: Sit to/from Stand Sit to Stand: Min assist;Min guard  General  transfer comment: min A with initial STS rep, progressing to min guard to steady with transfers  Ambulation/Gait Ambulation/Gait assistance: Min guard Gait Distance (Feet): 30 Feet (15x2) Assistive device: 1 person hand held assist Gait Pattern/deviations: Step-through pattern;Decreased stride length Gait velocity: decreased   General Gait Details: single HHA- pt declines RW, slightly unsteady and reaching for furniture/wall to steady, limited by nausea and nausea medication making pt feel loopy   Stairs             Wheelchair Mobility    Modified Rankin (Stroke Patients Only)       Balance Overall balance assessment: Mild deficits observed, not formally tested       Cognition Arousal/Alertness: Awake/alert Behavior During Therapy: WFL for tasks assessed/performed Overall Cognitive Status: Within Functional Limits for tasks assessed       Exercises General Exercises - Lower Extremity Toe Raises: Seated;AROM;Strengthening;Both;10 reps Heel Raises: Seated;AROM;Strengthening;Both;10 reps    General Comments        Pertinent Vitals/Pain Pain Assessment: No/denies pain ("no pain, just nauseas")    Home Living                      Prior Function            PT Goals (current goals can now be found in the care plan section) Acute Rehab PT Goals Patient Stated Goal: see my dog PT Goal Formulation: With patient/family Time For Goal Achievement: 11/15/20 Potential to Achieve Goals: Good Progress towards PT goals: Progressing toward goals    Frequency    Min 3X/week  PT Plan Current plan remains appropriate (nausea)    Co-evaluation              AM-PAC PT "6 Clicks" Mobility   Outcome Measure  Help needed turning from your back to your side while in a flat bed without using bedrails?: A Little Help needed moving from lying on your back to sitting on the side of a flat bed without using bedrails?: A Little Help needed moving to and  from a bed to a chair (including a wheelchair)?: A Little Help needed standing up from a chair using your arms (e.g., wheelchair or bedside chair)?: A Little Help needed to walk in hospital room?: A Little Help needed climbing 3-5 steps with a railing? : A Little 6 Click Score: 18    End of Session Equipment Utilized During Treatment: Gait belt Activity Tolerance: Other (comment) (limited by nausea) Patient left: in bed;with call bell/phone within reach;with bed alarm set Nurse Communication: Mobility status PT Visit Diagnosis: Difficulty in walking, not elsewhere classified (R26.2);Pain     Time: 2585-2778 PT Time Calculation (min) (ACUTE ONLY): 13 min  Charges:  $Therapeutic Activity: 8-22 mins                      Tori Kylena Mole PT, DPT 11/02/20, 12:25 PM

## 2020-11-02 NOTE — Progress Notes (Signed)
2 Days Post-Op  Subjective: Patient doesn't feel well today.  N/V and feels bloated.  Still having some diarrhea.  Walked well yesterday.  ROS: See above, otherwise other systems negative  Objective: Vital signs in last 24 hours: Temp:  [97.4 F (36.3 C)-98.2 F (36.8 C)] 97.9 F (36.6 C) (05/18 0627) Pulse Rate:  [64-97] 97 (05/18 0627) Resp:  [16-18] 18 (05/18 0627) BP: (115-149)/(55-92) 149/92 (05/18 0627) SpO2:  [96 %-100 %] 97 % (05/18 0627) Last BM Date: 11/01/20  Intake/Output from previous day: 05/17 0701 - 05/18 0700 In: 480 [P.O.:480] Out: 620 [Urine:120; Emesis/NG output:500] Intake/Output this shift: No intake/output data recorded.  PE: Heart: tachy Lungs: CTAB Abd: soft, but distended, few BS, appropriately tender, incisions c/d/i  Lab Results:  Recent Labs    11/01/20 0514 11/02/20 0508  WBC 17.0* 23.2*  HGB 10.0* 10.5*  HCT 33.7* 34.7*  PLT 294 369   BMET Recent Labs    11/01/20 0514 11/02/20 0508  NA 133* 131*  K 4.0 3.7  CL 100 96*  CO2 24 27  GLUCOSE 158* 137*  BUN 12 17  CREATININE 0.65 0.70  CALCIUM 9.3 9.8   PT/INR No results for input(s): LABPROT, INR in the last 72 hours. CMP     Component Value Date/Time   NA 131 (L) 11/02/2020 0508   NA 139 02/21/2018 0854   K 3.7 11/02/2020 0508   CL 96 (L) 11/02/2020 0508   CO2 27 11/02/2020 0508   GLUCOSE 137 (H) 11/02/2020 0508   BUN 17 11/02/2020 0508   BUN 20 02/21/2018 0854   CREATININE 0.70 11/02/2020 0508   CREATININE 0.54 (L) 11/28/2015 1422   CALCIUM 9.8 11/02/2020 0508   PROT 6.7 10/26/2020 1919   ALBUMIN 3.4 (L) 11/02/2020 0508   AST 18 10/26/2020 1919   ALT 14 10/26/2020 1919   ALKPHOS 104 10/26/2020 1919   BILITOT 1.1 10/26/2020 1919   GFRNONAA >60 11/02/2020 0508   GFRAA 78 02/21/2018 0854   Lipase     Component Value Date/Time   LIPASE 31 10/26/2020 1919       Studies/Results: No results found.  Anti-infectives: Anti-infectives (From admission,  onward)   Start     Dose/Rate Route Frequency Ordered Stop   10/31/20 2200  cefoTEtan (CEFOTAN) 2 g in sodium chloride 0.9 % 100 mL IVPB        2 g 200 mL/hr over 30 Minutes Intravenous Every 12 hours 10/31/20 1744 10/31/20 2205   10/31/20 0915  cefoTEtan (CEFOTAN) 2 g in sodium chloride 0.9 % 100 mL IVPB        2 g 200 mL/hr over 30 Minutes Intravenous On call to O.R. 10/31/20 0824 10/31/20 1312       Assessment/Plan Iron deficiency anemia - likely secondary to chronic GI losses, hgb10.5 Atrial fibrillation on eliquis - LD5/11 AM HTN HLD  POD 2, s/p lap assisted right hemicolectomy for ascending colon mass (adenocarcinoma) and multiple polyps (likely pre-malignant), Dr. Johney Maine 10/31/20 -path pending -likely has an ileus given N/V and inability to keep anything down along with distention.  Will check plain film to rule out need for NGT. -agree with primary on fluid bolus.  If needs to be NPO or NGT, will need to resume some IVF hydration to prevent dehydration -WBC up to 23K today, likely still reactive from surgery, but will closely follow.  No need for abx therapy and no pain or clinical exam concerning for infectious complication or etiology at  this point. -cont to mobilize and ambulate as able -IS/pulm toilet  FEN - CLD, but may need NPO VTE - Lovenox  ID - Cefotetan on call to OR   LOS: 6 days    Henreitta Cea , [facility] Surgery 11/02/2020, 9:53 AM Please see Amion for pager number during day hours 7:00am-4:30pm or 7:00am -11:30am on weekends

## 2020-11-03 DIAGNOSIS — C180 Malignant neoplasm of cecum: Secondary | ICD-10-CM | POA: Diagnosis not present

## 2020-11-03 LAB — COMPREHENSIVE METABOLIC PANEL
ALT: 19 U/L (ref 0–44)
AST: 16 U/L (ref 15–41)
Albumin: 3.3 g/dL — ABNORMAL LOW (ref 3.5–5.0)
Alkaline Phosphatase: 79 U/L (ref 38–126)
Anion gap: 8 (ref 5–15)
BUN: 13 mg/dL (ref 8–23)
CO2: 26 mmol/L (ref 22–32)
Calcium: 9.5 mg/dL (ref 8.9–10.3)
Chloride: 100 mmol/L (ref 98–111)
Creatinine, Ser: 0.64 mg/dL (ref 0.44–1.00)
GFR, Estimated: >60 mL/min (ref >60)
Glucose, Bld: 114 mg/dL — ABNORMAL HIGH (ref 70–99)
Potassium: 3.5 mmol/L (ref 3.5–5.1)
Sodium: 134 mmol/L — ABNORMAL LOW (ref 135–145)
Total Bilirubin: 1.9 mg/dL — ABNORMAL HIGH (ref 0.3–1.2)
Total Protein: 5.9 g/dL — ABNORMAL LOW (ref 6.5–8.1)

## 2020-11-03 LAB — CBC WITH DIFFERENTIAL/PLATELET
Abs Immature Granulocytes: 0.12 10*3/uL — ABNORMAL HIGH (ref 0.00–0.07)
Basophils Absolute: 0 10*3/uL (ref 0.0–0.1)
Basophils Relative: 0 %
Eosinophils Absolute: 0 10*3/uL (ref 0.0–0.5)
Eosinophils Relative: 0 %
HCT: 34.8 % — ABNORMAL LOW (ref 36.0–46.0)
Hemoglobin: 10.3 g/dL — ABNORMAL LOW (ref 12.0–15.0)
Immature Granulocytes: 1 %
Lymphocytes Relative: 6 %
Lymphs Abs: 1.1 10*3/uL (ref 0.7–4.0)
MCH: 23.1 pg — ABNORMAL LOW (ref 26.0–34.0)
MCHC: 29.6 g/dL — ABNORMAL LOW (ref 30.0–36.0)
MCV: 78 fL — ABNORMAL LOW (ref 80.0–100.0)
Monocytes Absolute: 0.7 10*3/uL (ref 0.1–1.0)
Monocytes Relative: 4 %
Neutro Abs: 15.8 10*3/uL — ABNORMAL HIGH (ref 1.7–7.7)
Neutrophils Relative %: 89 %
Platelets: 333 10*3/uL (ref 150–400)
RBC: 4.46 MIL/uL (ref 3.87–5.11)
RDW: 22.1 % — ABNORMAL HIGH (ref 11.5–15.5)
WBC: 17.7 10*3/uL — ABNORMAL HIGH (ref 4.0–10.5)
nRBC: 0 % (ref 0.0–0.2)

## 2020-11-03 LAB — PHOSPHORUS: Phosphorus: 1.6 mg/dL — ABNORMAL LOW (ref 2.5–4.6)

## 2020-11-03 LAB — MAGNESIUM: Magnesium: 2.2 mg/dL (ref 1.7–2.4)

## 2020-11-03 MED ORDER — SODIUM PHOSPHATES 45 MMOLE/15ML IV SOLN
30.0000 mmol | Freq: Once | INTRAVENOUS | Status: AC
  Administered 2020-11-03: 30 mmol via INTRAVENOUS
  Filled 2020-11-03: qty 10

## 2020-11-03 MED ORDER — MAGIC MOUTHWASH
15.0000 mL | Freq: Four times a day (QID) | ORAL | Status: AC
  Administered 2020-11-03: 15 mL via ORAL
  Filled 2020-11-03 (×2): qty 15

## 2020-11-03 MED ORDER — LORAZEPAM 2 MG/ML IJ SOLN
0.2500 mg | Freq: Two times a day (BID) | INTRAMUSCULAR | Status: DC | PRN
  Administered 2020-11-03 – 2020-11-16 (×15): 0.25 mg via INTRAVENOUS
  Filled 2020-11-03 (×15): qty 1

## 2020-11-03 MED ORDER — DILTIAZEM HCL-DEXTROSE 125-5 MG/125ML-% IV SOLN (PREMIX)
5.0000 mg/h | INTRAVENOUS | Status: AC
  Administered 2020-11-03: 5 mg/h via INTRAVENOUS
  Administered 2020-11-04: 15 mg/h via INTRAVENOUS
  Administered 2020-11-04: 10 mg/h via INTRAVENOUS
  Filled 2020-11-03 (×4): qty 125

## 2020-11-03 NOTE — Progress Notes (Addendum)
PROGRESS NOTE    Robin [name]  ERX:[id number] DOB: 12-08-1934 DOA: 10/26/2020 PCP: Robin Stalker, PA-C   No chief complaint on file.  Brief Narrative:  86 year old F with PMH of Robin [name]. fib on Eliquis and HTN presented to ED for anemia and shortness of breath, and admitted for blood loss anemia due to GI bleed.  Hgb 8.5 (reportedly 11 in January 2022 per daughter).  Hemoccult positive.  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  Colonoscopy on 5/13 confirmed malignant tumor adjacent to the IC valve. Pathology from proximal mass with invasive adenocarcinoma.  Pathology of polyp from transverse colon and splenic flexure with at least high-grade dysplasia. MRI abdomen with and without contrast without significant finding within the visualized portion of the abdomen. Patient underwent laparoscopic proximal hemicolectomy with reanastomosis on 5/16.  Post op treatment now per surgery.  Assessment & Plan:   Principal Problem:   Cecal cancer & polyps s/p lap hemicolectomy 10/31/2020 Active Problems:   Paroxysmal atrial fibrillation (HCC)   Chronic anticoagulation   Anemia   GI bleed   Colonic mass   High grade dysplasia in colonic adenomas transverse colon & splenic flexure   Colon cancer (HCC)   Hypophosphatemia  Adenocarcinoma of proximal colon with ileocecal adenopathy: Noted on CT abdomen and pelvis and colonoscopy. MRI as above.  Pathology from proximal mass with invasive adenocarcinoma.  Pathology of polyp from transverse colon and splenic flexure with at least high-grade dysplasia.  -S/p laparoscopic proximal hemicolectomy with reanastomosis by Dr. Johney Maine on 5/16 -General surgery managing -Follow surgical pathology  Nausea  Vomiting  Ileus  - plain film with dilated small bowel loobs concerning for distal small bowel obstruction or ileus - NGT to LIS, NPO - per surgery, appreciate assistance -  IVF  Severe iron deficiency anemia due to acute on chronic blood loss from GI bleed: FOBT positive.  Hgb 13.7 in 2019, and reportedly about 12 in January 2022 per daughter. - Hb relatively stable today, follow - labs c/w iron def anemia, low normal B12 (follow MMA)  Paroxysmal Robin [name]. Fib with RVR: On Toprol-XL, Cardizem and Eliquis at home.  CHA2DS2-VASc score 4 (age, sex, HTN) - rate in 102's today, better controlled, but AV nodal blockers have been held PO -Continue Toprol-XL and Cardizem these have been d/c'd -> transition to IV dilt for now with prn metoprolol  -tachycardia noted today, mild, ? 2/2 emesis and volume depletion, follow with bolus -Resume Eliquis once okay from surgical perspective  Essential hypertension: Normotensive. -metop and diltiazide on hold - receiving IV dilt -Continue holding Maxzide for now  Hypokalemia/hypophosphatemia: Hypokalemia could be due to HCTZ.  Resolved. -Recheck in the morning and follow and replace  Leukocytosis - reactive? Continue to monitor - improving  DVT prophylaxis: lovenox Code Status: full Family Communication: daughter over phone in room - 5/19  Disposition:   Status is: Inpatient  Remains inpatient appropriate because:Inpatient level of care appropriate due to severity of illness   Dispo: The patient is from: Home              Anticipated d/c is to: pending              Patient currently is not medically stable to d/c.   Difficult to place patient No       Consultants:   Surgery  GI  Procedures:  5/16 PROCEDURE:   LAPAROSCOPIC PROXIMAL HEMICOLECTOMY  OMENTOPEXY OF ANASTOMOSIS LAPAROSCOPIC LYSIS OF  ADHESIONS TRANSVERSUS ABDOMINIS PLANE (TAP) BLOCK - BILATERAL  Colonoscopy - Obvious malignant tumor adjacent to the IC valve. Biopsied. - Several polyps were also removed (mixed snare cautery and cold snare). The largest was located at the presumed splenic flexure, removed piecemeal, and although it was soft  and not overtly malignant I labled the site with submucosal Spot injections given it's size (2.5cm). - Return patient to hospital ward for ongoing care. - Will need staging tests with CT scan chest, abd, pelvis. CEA. - I recommend continuing to hold eliquis for now. Await pathology results. - General surgery has already been consulted. - [facility] will return to see her on Monday. Drs. Collene Mares and Benson Norway are covering this weekend if needed.  Antimicrobials: Anti-infectives (From admission, onward)   Start     Dose/Rate Route Frequency Ordered Stop   10/31/20 2200  cefoTEtan (CEFOTAN) 2 g in sodium chloride 0.9 % 100 mL IVPB        2 g 200 mL/hr over 30 Minutes Intravenous Every 12 hours 10/31/20 1744 10/31/20 2205   10/31/20 0915  cefoTEtan (CEFOTAN) 2 g in sodium chloride 0.9 % 100 mL IVPB        2 g 200 mL/hr over 30 Minutes Intravenous On call to O.R. 10/31/20 0824 10/31/20 1312        Subjective: Feels Robin [name] little better today, still nauseous with ice chips  Objective: Vitals:   11/02/20 1440 11/02/20 1937 11/03/20 0500 11/03/20 0500  BP: (!) 146/68 (!) 141/73  (!) 159/100  Pulse: 86 90  (!) 110  Resp: (!) 23 18  18   Temp: 98.6 F (37 C) (!) 97.5 F (36.4 C)  97.6 F (36.4 C)  TempSrc: Oral Oral  Oral  SpO2: 95% 95%  95%  Weight:   68.5 kg   Height:        Intake/Output Summary (Last 24 hours) at 11/03/2020 0954 Last data filed at 11/03/2020 0601 Gross per 24 hour  Intake 10 ml  Output 800 ml  Net -790 ml   Filed Weights   10/26/20 1729 11/03/20 0500  Weight: 66 kg 68.5 kg    Examination:  General: No acute distress. Cardiovascular: irregularly irregular Lungs: unlabored Abdomen: Soft, mildly distended, appropriately tender.  Surgical dressing in place.   Neurological: Alert and oriented 3. Moves all extremities 4. Cranial nerves II through XII grossly intact. Skin: Warm and dry. No rashes or lesions. Extremities: No clubbing or cyanosis. No edema.   Data  Reviewed: I have personally reviewed following labs and imaging studies  CBC: Recent Labs  Lab 10/28/20 0720 10/29/20 0626 10/30/20 0525 10/31/20 0525 11/01/20 0514 11/02/20 0508 11/03/20 0523  WBC 7.0 7.9 8.6  --  17.0* 23.2* 17.7*  NEUTROABS 4.8 5.5  --   --   --   --  15.8*  HGB 9.0* 7.9* 8.4* 9.8* 10.0* 10.5* 10.3*  HCT 31.6* 28.0* 29.7* 33.2* 33.7* 34.7* 34.8*  MCV 75.4* 75.5* 76.2*  --  78.2* 76.4* 78.0*  PLT 362 340 344  --  294 369 0000000    Basic Metabolic Panel: Recent Labs  Lab 10/28/20 0720 10/29/20 0626 10/30/20 0525 10/31/20 0525 11/01/20 0514 11/02/20 0508 11/03/20 0523  NA 135 135 136  --  133* 131* 134*  K 3.2* 3.0* 4.3 3.6 4.0 3.7 3.5  CL 104 101 104  --  100 96* 100  CO2 24 26 27   --  24 27 26   GLUCOSE 111* 105* 102*  --  158* 137* 114*  BUN 11 10 11   --  12 17 13   CREATININE 0.62 0.67 0.60  --  0.65 0.70 0.64  CALCIUM 10.2 9.9 9.9  --  9.3 9.8 9.5  MG 2.0  --  2.0 2.2 1.8 2.2 2.2  PHOS 2.3*  --  2.4* 3.1  --  1.8* 1.6*    GFR: Estimated Creatinine Clearance: 50.6 mL/min (by C-G formula based on SCr of 0.64 mg/dL).  Liver Function Tests: Recent Labs  Lab 10/30/20 0525 11/02/20 0508 11/03/20 0523  AST  --   --  16  ALT  --   --  19  ALKPHOS  --   --  79  BILITOT  --   --  1.9*  PROT  --   --  5.9*  ALBUMIN 3.3* 3.4* 3.3*    CBG: No results for input(s): GLUCAP in the last 168 hours.   Recent Results (from the past 240 hour(s))  Resp Panel by RT-PCR (Flu Robin [name]&B, Covid) Nasopharyngeal Swab     Status: None   Collection Time: 10/26/20 10:00 PM   Specimen: Nasopharyngeal Swab; Nasopharyngeal(NP) swabs in vial transport medium  Result Value Ref Range Status   SARS Coronavirus 2 by RT PCR NEGATIVE NEGATIVE Final    Comment: (NOTE) SARS-CoV-2 target nucleic acids are NOT DETECTED.  The SARS-CoV-2 RNA is generally detectable in upper respiratory specimens during the acute phase of infection. The lowest concentration of SARS-CoV-2 viral  copies this assay can detect is 138 copies/mL. Kamon Fahr negative result does not preclude SARS-Cov-2 infection and should not be used as the sole basis for treatment or other patient management decisions. Ladarious Kresse negative result may occur with  improper specimen collection/handling, submission of specimen other than nasopharyngeal swab, presence of viral mutation(s) within the areas targeted by this assay, and inadequate number of viral copies(<138 copies/mL). Jayleene Glaeser negative result must be combined with clinical observations, patient history, and epidemiological information. The expected result is Negative.  Fact Sheet for Patients:  EntrepreneurPulse.com.au  Fact Sheet for Healthcare Providers:  IncredibleEmployment.be  This test is no t yet approved or cleared by the [place] FDA and  has been authorized for detection and/or diagnosis of SARS-CoV-2 by FDA under an Emergency Use Authorization (EUA). This EUA will remain  in effect (meaning this test can be used) for the duration of the COVID-19 declaration under Section 564(b)(1) of the Act, 21 U.S.C.section 360bbb-3(b)(1), unless the authorization is terminated  or revoked sooner.       Influenza [place] by PCR NEGATIVE NEGATIVE Final   Influenza B by PCR NEGATIVE NEGATIVE Final    Comment: (NOTE) The Xpert Xpress SARS-CoV-2/FLU/RSV plus assay is intended as an aid in the diagnosis of influenza from Nasopharyngeal swab specimens and should not be used as [place] Hamor sole basis for treatment. Nasal washings and aspirates are unacceptable for Xpert Xpress SARS-CoV-2/FLU/RSV testing.  Fact Sheet for Patients: EntrepreneurPulse.com.au  Fact Sheet for Healthcare Providers: IncredibleEmployment.be  This test is not yet approved or cleared by the [place] FDA and has been authorized for detection and/or diagnosis of SARS-CoV-2 by FDA under an Emergency Use Authorization (EUA). This  EUA will remain in effect (meaning this test can be used) for the duration of the COVID-19 declaration under Section 564(b)(1) of the Act, 21 U.S.C. section 360bbb-3(b)(1), unless the authorization is terminated or revoked.  Performed at [facility], [place] [address] [place], [place] [address]   Surgical pcr screen     Status:  None   Collection Time: 10/31/20 10:19 AM   Specimen: Nasal Mucosa; Nasal Swab  Result Value Ref Range Status   MRSA, PCR NEGATIVE NEGATIVE Final   Staphylococcus aureus NEGATIVE NEGATIVE Final    Comment: (NOTE) The Xpert SA Assay (FDA approved for NASAL specimens in patients 42 years of age and older), is one component of [place] comprehensive surveillance program. It is not intended to diagnose infection nor to guide or monitor treatment. Performed at [facility], [place] [address], [place], [place] [address]          Radiology Studies: DG Abd 1 View  Result Date: 11/02/2020 CLINICAL DATA:  NG tube placement EXAM: ABDOMEN - 1 VIEW COMPARISON:  11/02/2020 FINDINGS: Esophageal tube tip and side port overlie the proximal stomach. Partially visualized dilated air distended bowel. IMPRESSION: Esophageal tube tip and side port overlie the proximal stomach Electronically Signed   By: Donavan Foil M.D.   On: 11/02/2020 20:20   DG Abd Portable 1V  Result Date: 11/02/2020 CLINICAL DATA:  Ileus. EXAM: PORTABLE ABDOMEN - 1 VIEW COMPARISON:  Oct 28, 2020. FINDINGS: Status post cholecystectomy. No colonic dilatation. Dilated small bowel loops are seen centrally and in the right abdomen concerning for distal small bowel obstruction or ileus. No abnormal calcifications are noted IMPRESSION: Dilated small bowel loops are noted concerning for distal small bowel obstruction or ileus. Electronically Signed   By: Marijo Conception M.D.   On: 11/02/2020 10:30        Scheduled Meds: . Chlorhexidine Gluconate Cloth  6 each Topical Daily  .  enoxaparin (LOVENOX) injection  40 mg Subcutaneous Q24H  . magic mouthwash  15 mL Oral QID  . simethicone  40 mg Oral QID   Continuous Infusions: . lactated ringers    . lactated ringers 75 mL/hr at 11/03/20 0400  . methocarbamol (ROBAXIN) IV    . sodium phosphate  Dextrose 5% IVPB 30 mmol (11/03/20 0807)     LOS: 7 days    Time spent: over 30 min    Fayrene Helper, MD [facility]   To contact the attending provider between 7A-7P or the covering provider during after hours 7P-7A, please log into the web site [email or web address] and access using universal [facility] password for that web site. If you do not have the password, please call the hospital operator.  11/03/2020, 9:54 AM

## 2020-11-03 NOTE — Care Management Important Message (Signed)
Important Message  Patient Details IM Letter given to the Patient. Name: Robin [name] MRN: [id number] Date of Birth: 1935-02-18   Medicare Important Message Given:  Yes     Kerin Salen 11/03/2020, 10:12 AM

## 2020-11-03 NOTE — Progress Notes (Signed)
Robin [name] [id number] [birth year]  CARE TEAM:  PCP: [name], PA-C  Outpatient Care Team: Patient Care Team: [name], PA-C as PCP - General (Family Medicine) Skeet Latch, MD as PCP - Cardiology (Cardiology) Milus Banister, MD as Attending Physician (Gastroenterology) [name], MD as Consulting Physician (General Surgery)  Inpatient Treatment Team: Treatment Team: Attending Provider: Elodia Florence., MD; Rounding Team: Edison Pace, Md, MD; Technician: Jeannetta Nap, [place]; Rounding Team: Suzan Garibaldi, MD; Consulting Physician: [name], MD; Occupational Therapist: Lenward Chancellor, OT; Registered Nurse: Lottie Dawson, RN; Pharmacist: Napoleon Form, [facility]   Problem List:   Principal Problem:   Cecal cancer & polyps s/p lap hemicolectomy 10/31/2020 Active Problems:   High grade dysplasia in colonic adenomas transverse colon & splenic flexure   Paroxysmal atrial fibrillation (HCC)   Chronic anticoagulation   Anemia   GI bleed   Colonic mass   Colon cancer ([place])   Hypophosphatemia   3 Days Post-Op  10/31/2020  POST-OPERATIVE DIAGNOSIS: CECALCOLON CANCER TRANSVERSE COLON POLYPS WITH HIGH GRADE DYSPLASIA & POSSIBLE ADENOCARCINOMA IRON DEFICIENCYANEMIAFROM LOWER GI BLOOD LOSS  PROCEDURE: LAPAROSCOPICPROXIMAL HEMICOLECTOMY  OMENTOPEXY OF ANASTOMOSIS LAPAROSCOPIC LYSIS OF ADHESIONS TRANSVERSUS ABDOMINIS PLANE (TAP) BLOCK - BILATERAL  SURGEON: Adin Hector, MD  OR FINDINGS:  CASE DATA:  Type of patient?:LDOW CASE (Surgical Hospitalist [facility] Inpatient)  Status of Case?URGENT Add On  Infection Present At Time Of Surgery(PATOS)?NO  Patient hadmoderately significant intra-abdominal adhesions from her prior open cholecystectomy as well as pelvic surgeries.  Obvious bulky mass in the cecum near the ileocecal valve. Tattooing actually at the mid transverse colon. She had a rather redundant stretched  out colon. Specimen opened on the back table and clearly the tattooed region had some fibrosis and thickening through the colon wall suspicious for some residual cancer.  No obvious metastatic disease on visceral parietal peritoneum or liver.  It is anisoperistaltic anastomosis between terminal ileum and distal transverse colon resting in the left upper quadrant.    Assessment  ILEUS  [facility] Stay = 7 days)  Assessment/Plan Iron deficiency anemia - likely secondary to chronic GI losses, hgb10.5 Atrial fibrillation on eliquis - LD5/11 AM HTN HLD  POD 3, s/p lap assisted right hemicolectomy for ascending colon mass (adenocarcinoma) and multiple polyps (likely pre-malignant), Dr. Johney Maine 10/31/20 -path pending -likely has an ileus given N/V and inability to keep anything down along with distention.   -continue NGT  -IVF hydration to prevent dehydration -WBC up but falling , likely still reactive from surgery, but will closely follow.  No need for abx therapy and no pain or clinical exam concerning for infectious complication or etiology at this point. -cont to mobilize and ambulate as able -IS/pulm toilet  FEN -sips/NGT.  TNA if ileus > POD#7.  Phosphorus very low.  Most likely reflection of malnutrition.  IV replacement VTE -Lovenox  ID -Cefotetan periop   30 minutes spent in review, evaluation, examination, counseling, and coordination of care.   I have reviewed this patient's available data, including medical history, events of note, physical examination and test results as part of my evaluation.  A significant portion of that time was spent in counseling.  Care during the described time interval was provided by me.  11/03/2020    Subjective: (Chief complaint)  Recurrent emesis with evidence of ileus.  NG tube placed.  Patient denies much abdominal pain.  Sore throat.  Had small bowel movement but no major flatus.  Walking around in room.  Nursing just  outside room.  NG tube clogged with thick material.  I was able to flush and fix.  Objective:  Vital signs:  Vitals:   11/02/20 1440 11/02/20 1937 11/03/20 0500 11/03/20 0500  BP: (!) 146/68 (!) 141/73  (!) 159/100  Pulse: 86 90  (!) 110  Resp: (!) 23 18  18   Temp: 98.6 F (37 C) (!) 97.5 F (36.4 C)  97.6 F (36.4 C)  TempSrc: Oral Oral  Oral  SpO2: 95% 95%  95%  Weight:   68.5 kg   Height:        Last BM Date: 11/02/20  Intake/Output   Yesterday:  05/18 0701 - 05/19 0700 In: 10 [P.O.:10] Out: 800 [Urine:200; Emesis/NG output:600] This shift:  No intake/output data recorded.  Bowel function:  Flatus: No  BM:  YES  Drain: Thick brown effluent with chunks.  Not quite fecalized.  NG tube effluent   Physical Exam:  General: Pt awake/alert in no acute distress Eyes: PERRL, normal EOM.  Sclera clear.  No icterus Neuro: CN II-XII intact w/o focal sensory/motor deficits. Lymph: No head/neck/groin lymphadenopathy Psych:  No delerium/psychosis/paranoia.  Oriented x 4 HENT: Normocephalic, Mucus membranes moist.  No thrush.  Sore throat Neck: Supple, No tracheal deviation.  No obvious thyromegaly Chest: No pain to chest wall compression.  Good respiratory excursion.  No audible wheezing CV:  Pulses intact.  Regular rhythm.  No major extremity edema MS: Normal AROM mjr joints.  No obvious deformity  Abdomen: Soft.  Moderately distended.  Nontender.  No evidence of peritonitis.  No incarcerated hernias.  Ext:  No deformity.  No mjr edema.  No cyanosis Skin: No petechiae / purpurea.  No major sores.  Warm and dry    Results:   Cultures: Recent Results (from the past 720 hour(s))  Resp Panel by RT-PCR (Flu A&B, Covid) Nasopharyngeal Swab     Status: None   Collection Time: 10/26/20 10:00 PM   Specimen: Nasopharyngeal Swab; Nasopharyngeal(NP) swabs in vial transport medium  Result Value Ref Range Status   SARS Coronavirus 2 by RT PCR NEGATIVE NEGATIVE Final     Comment: (NOTE) SARS-CoV-2 target nucleic acids are NOT DETECTED.  The SARS-CoV-2 RNA is generally detectable in upper respiratory specimens during the acute phase of infection. The lowest concentration of SARS-CoV-2 viral copies this assay can detect is 138 copies/mL. A negative result does not preclude SARS-Cov-2 infection and should not be used as the sole basis for treatment or other patient management decisions. A negative result may occur with  improper specimen collection/handling, submission of specimen other than nasopharyngeal swab, presence of viral mutation(s) within the areas targeted by this assay, and inadequate number of viral copies(<138 copies/mL). A negative result must be combined with clinical observations, patient history, and epidemiological information. The expected result is Negative.  Fact Sheet for Patients:  EntrepreneurPulse.com.au  Fact Sheet for Healthcare Providers:  IncredibleEmployment.be  This test is no t yet approved or cleared by the [place] FDA and  has been authorized for detection and/or diagnosis of SARS-CoV-2 by FDA under an Emergency Use Authorization (EUA). This EUA will remain  in effect (meaning this test can be used) for the duration of the COVID-19 declaration under Section 564(b)(1) of the Act, 21 U.S.C.section 360bbb-3(b)(1), unless the authorization is terminated  or revoked sooner.       Influenza A by PCR NEGATIVE NEGATIVE Final   Influenza B by PCR NEGATIVE NEGATIVE Final    Comment: (NOTE)  The Xpert Xpress SARS-CoV-2/FLU/RSV plus assay is intended as an aid in the diagnosis of influenza from Nasopharyngeal swab specimens and should not be used as a sole basis for treatment. Nasal washings and aspirates are unacceptable for Xpert Xpress SARS-CoV-2/FLU/RSV testing.  Fact Sheet for Patients: EntrepreneurPulse.com.au  Fact Sheet for Healthcare  Providers: IncredibleEmployment.be  This test is not yet approved or cleared by the [place] FDA and has been authorized for detection and/or diagnosis of SARS-CoV-2 by FDA under an Emergency Use Authorization (EUA). This EUA will remain in effect (meaning this test can be used) for the duration of the COVID-19 declaration under Section 564(b)(1) of the Act, 21 U.S.C. section 360bbb-3(b)(1), unless the authorization is terminated or revoked.  Performed at [facility], [place] [address], [place], [place] [address]   Surgical pcr screen     Status: None   Collection Time: 10/31/20 10:19 AM   Specimen: Nasal Mucosa; Nasal Swab  Result Value Ref Range Status   MRSA, PCR NEGATIVE NEGATIVE Final   Staphylococcus aureus NEGATIVE NEGATIVE Final    Comment: (NOTE) The Xpert SA Assay (FDA approved for NASAL specimens in patients 50 years of age and older), is one component of a comprehensive surveillance program. It is not intended to diagnose infection nor to guide or monitor treatment. Performed at [facility], [place] [address] [place], [place] [address]     Labs: Results for orders placed or performed during the hospital encounter of 10/26/20 (from the past 48 hour(s))  CBC     Status: Abnormal   Collection Time: 11/02/20  5:08 AM  Result Value Ref Range   WBC 23.2 (H) 4.0 - 10.5 K/uL   RBC 4.54 3.87 - 5.11 MIL/uL   Hemoglobin 10.5 (L) 12.0 - 15.0 g/dL   HCT 34.7 (L) 36.0 - 46.0 %   MCV 76.4 (L) 80.0 - 100.0 fL   MCH 23.1 (L) 26.0 - 34.0 pg   MCHC 30.3 30.0 - 36.0 g/dL   RDW 21.2 (H) 11.5 - 15.5 %   Platelets 369 150 - 400 K/uL   nRBC 0.0 0.0 - 0.2 %    Comment: Performed at [facility], [place] [address] [place], [place] [address]  Renal function panel     Status: Abnormal   Collection Time: 11/02/20  5:08 AM  Result Value Ref Range   Sodium 131 (L) 135 - 145 mmol/L   Potassium 3.7 3.5 - 5.1  mmol/L   Chloride 96 (L) 98 - 111 mmol/L   CO2 27 22 - 32 mmol/L   Glucose, Bld 137 (H) 70 - 99 mg/dL    Comment: Glucose reference range applies only to samples taken after fasting for at least 8 hours.   BUN 17 8 - 23 mg/dL   Creatinine, Ser 0.70 0.44 - 1.00 mg/dL   Calcium 9.8 8.9 - 10.3 mg/dL   Phosphorus 1.8 (L) 2.5 - 4.6 mg/dL   Albumin 3.4 (L) 3.5 - 5.0 g/dL   GFR, Estimated >60 >60 mL/min    Comment: (NOTE) Calculated using the CKD-EPI Creatinine Equation (2021)    Anion gap 8 5 - 15    Comment: Performed at [facility], [place] [address] [place], [place] [address]  Magnesium     Status: None   Collection Time: 11/02/20  5:08 AM  Result Value Ref Range   Magnesium 2.2 1.7 - 2.4 mg/dL    Comment: Performed at [facility], [address] [place], [place] [address]  CBC with Differential/Platelet     Status: Abnormal   Collection Time: 11/03/20  5:23 AM  Result Value Ref Range   WBC 17.7 (H) 4.0 - 10.5 K/uL   RBC 4.46 3.87 - 5.11 MIL/uL   Hemoglobin 10.3 (L) 12.0 - 15.0 g/dL   HCT 34.8 (L) 36.0 - 46.0 %   MCV 78.0 (L) 80.0 - 100.0 fL   MCH 23.1 (L) 26.0 - 34.0 pg   MCHC 29.6 (L) 30.0 - 36.0 g/dL   RDW 22.1 (H) 11.5 - 15.5 %   Platelets 333 150 - 400 K/uL   nRBC 0.0 0.0 - 0.2 %   Neutrophils Relative % 89 %   Neutro Abs 15.8 (H) 1.7 - 7.7 K/uL   Lymphocytes Relative 6 %   Lymphs Abs 1.1 0.7 - 4.0 K/uL   Monocytes Relative 4 %   Monocytes Absolute 0.7 0.1 - 1.0 K/uL   Eosinophils Relative 0 %   Eosinophils Absolute 0.0 0.0 - 0.5 K/uL   Basophils Relative 0 %   Basophils Absolute 0.0 0.0 - 0.1 K/uL   Immature Granulocytes 1 %   Abs Immature Granulocytes 0.12 (H) 0.00 - 0.07 K/uL    Comment: Performed at [facility], [place] [address] [place], [place] [address]  Comprehensive metabolic panel     Status: Abnormal   Collection Time: 11/03/20  5:23 AM  Result Value Ref Range   Sodium 134 (L) 135 - 145 mmol/L    Potassium 3.5 3.5 - 5.1 mmol/L   Chloride 100 98 - 111 mmol/L   CO2 26 22 - 32 mmol/L   Glucose, Bld 114 (H) 70 - 99 mg/dL    Comment: Glucose reference range applies only to samples taken after fasting for at least 8 hours.   BUN 13 8 - 23 mg/dL   Creatinine, Ser 0.64 0.44 - 1.00 mg/dL   Calcium 9.5 8.9 - 10.3 mg/dL   Total Protein 5.9 (L) 6.5 - 8.1 g/dL   Albumin 3.3 (L) 3.5 - 5.0 g/dL   AST 16 15 - 41 U/L   ALT 19 0 - 44 U/L   Alkaline Phosphatase 79 38 - 126 U/L   Total Bilirubin 1.9 (H) 0.3 - 1.2 mg/dL   GFR, Estimated >60 >60 mL/min    Comment: (NOTE) Calculated using the CKD-EPI Creatinine Equation (2021)    Anion gap 8 5 - 15    Comment: Performed at [facility], [place] [address], [place], [place] [address]  Magnesium     Status: None   Collection Time: 11/03/20  5:23 AM  Result Value Ref Range   Magnesium 2.2 1.7 - 2.4 mg/dL    Comment: Performed at [facility], [place] [address], [place], [place] [address]  Phosphorus     Status: Abnormal   Collection Time: 11/03/20  5:23 AM  Result Value Ref Range   Phosphorus 1.6 (L) 2.5 - 4.6 mg/dL    Comment: Performed at [facility], [place] [address] [place], [place] [address]    Imaging / Studies: DG Abd 1 View  Result Date: 11/02/2020 CLINICAL DATA:  NG tube placement EXAM: ABDOMEN - 1 VIEW COMPARISON:  11/02/2020 FINDINGS: Esophageal tube tip and side port overlie the proximal stomach. Partially visualized dilated air distended bowel. IMPRESSION: Esophageal tube tip and side port overlie the proximal stomach Electronically Signed   By: Donavan Foil M.D.   On: 11/02/2020 20:20  DG Abd Portable 1V  Result Date: 11/02/2020 CLINICAL DATA:  Ileus. EXAM: PORTABLE ABDOMEN - 1 VIEW COMPARISON:  Oct 28, 2020. FINDINGS: Status post cholecystectomy. No colonic dilatation. Dilated small bowel loops are seen centrally and in the right abdomen concerning for distal small bowel  obstruction or ileus. No abnormal calcifications are noted IMPRESSION: Dilated small bowel loops are noted concerning for distal small bowel obstruction or ileus. Electronically Signed   By: Marijo Conception M.D.   On: 11/02/2020 10:30    Medications / Allergies: per chart  Antibiotics: Anti-infectives (From admission, onward)   Start     Dose/Rate Route Frequency Ordered Stop   10/31/20 2200  cefoTEtan (CEFOTAN) 2 g in sodium chloride 0.9 % 100 mL IVPB        2 g 200 mL/hr over 30 Minutes Intravenous Every 12 hours 10/31/20 1744 10/31/20 2205   10/31/20 0915  cefoTEtan (CEFOTAN) 2 g in sodium chloride 0.9 % 100 mL IVPB        2 g 200 mL/hr over 30 Minutes Intravenous On call to O.R. 10/31/20 0824 10/31/20 1312        Note: Portions of this report may have been transcribed using voice recognition software. Every effort was made to ensure accuracy; however, inadvertent computerized transcription errors may be present.   Any transcriptional errors that result from this process are unintentional.    Adin Hector, MD, FACS, MASCRS  Esophageal, Gastrointestinal & Colorectal Surgery [facility]otic and Minimally Invasive [facility] [address] [place], [place] [address] [phone] Fax [phone] Main/Paging  CONTACT INFORMATION: Weekday (9AM-5PM) concerns: Call [facility] main office at [phone] Weeknight (5PM-9AM) or Weekend/Holiday concerns: Check [email or web address] for General Surgery [facility] coverage (Please, do not use SecureChat as it is not reliable communication to operating surgeons for immediate patient care)      11/03/2020  8:07 AM

## 2020-11-03 NOTE — Plan of Care (Signed)
  Problem: Education: Goal: Knowledge of General Education information will improve Description: Including pain rating scale, medication(s)/side effects and non-pharmacologic comfort measures Outcome: Completed/Met

## 2020-11-03 NOTE — Plan of Care (Signed)
Pt tolerated NG tube well; some complain about soar throat 2nd to tube; able to ambulate using the BSC. No s/s of acute distress or pain observed or reported. Call light within reach and bed at lowest position for safety. Pt had 3 episode of urine, dark yellow, clear. Pt uses mouth sponges with water, ice chips making her a little nauseous.

## 2020-11-03 NOTE — Progress Notes (Signed)
Patient s/p laparoscopic proximal hemicolectomy surgery on 11/03/2020 Dx: Cecal adenocarcinoma ? POSSIBLE transverse colon CA as well?  Of note the patient had numerous other polyps with high-grade dysplasia and possible cancers at transverse colon.  It would be helpful of pathology could double check those spots, especially the tattooed area that was of the greatest suspicion according to gastroenterology and Dr. Tresa Moore with pathology.  In the end, lymph nodes are negative and Robin [name] margins are negative so it may not affect things but I wish to be thorough since there was concern of multiple cancers and not just the obvious cecal CA.    [place]:   1.  Please set the patient up for GI Tumor Board.  Patient Care Team: [name], PA-C as PCP - General (Family Medicine) Skeet Latch, MD as PCP - Cardiology (Cardiology) Milus Banister, MD as Attending Physician (Gastroenterology) [name], MD as Consulting Physician (General Surgery)

## 2020-11-03 NOTE — Progress Notes (Signed)
Occupational Therapy Treatment Patient Details Name: Robin [name] MRN: [id number] DOB: 07-25-1934 Today's Date: 11/03/2020    History of present illness 86 year old F presented to ED for anemia and shortness of breath, and admitted for blood loss anemia due to GI bleed.  Hgb 8.5 (reportedly 11 in January 2022 per daughter).  Hemoccult positive.  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 s/p laparoscopic hemicolectomy 10/31/20. Pt  with PMH of A. fib on Eliquis and HTN.   OT comments  Treatment focused on ADLs out of bed. Patient stood at sink for 9 minutes for grooming tasks with supervision and performed toileting with supervision as well. Patient ambulated 60 feet in hallway holding onto IV pole. Reports mild pain in abdomen and reported her legs felt weak with ambulation. Patient progressing towards goals. Reports her daughter is going to assist her at discharge.   Follow Up Recommendations  Home health OT;Supervision - Intermittent    Equipment Recommendations  None recommended by OT    Recommendations for Other Services      Precautions / Restrictions Precautions Precautions: Other (comment) Precaution Comments: abdominal surgery, NG Restrictions Weight Bearing Restrictions: No       Mobility Bed Mobility Overal bed mobility: Needs Assistance Bed Mobility: Supine to Sit Rolling: Supervision              Transfers Overall transfer level: Needs assistance   Transfers: Sit to/from Stand           General transfer comment: min guard to stand and ambulate to the bathroom without DME. To ambulate in hall approx 60 feet patient held onto IV pole.    Balance Overall balance assessment: Mild deficits observed, not formally tested                                         ADL either performed or assessed with clinical judgement   ADL       Grooming:  Standing;Oral care;Wash/dry face;Wash/dry hands;Brushing hair                   Toilet Transfer: Electronics engineer and Hygiene: Supervision/safety;Sit to/from stand               Vision Patient Visual Report: No change from baseline     Perception     Praxis      Cognition Arousal/Alertness: Awake/alert Behavior During Therapy: WFL for tasks assessed/performed Overall Cognitive Status: Within Functional Limits for tasks assessed                                          Exercises     Shoulder Instructions       General Comments      Pertinent Vitals/ Pain       Pain Assessment: Faces Faces Pain Scale: Hurts a little bit Pain Location: abdomen with mobility Pain Descriptors / Indicators: Sore;Tender Pain Intervention(s): Limited activity within patient's tolerance;Monitored during session  Home Living                                          Prior  Functioning/Environment              Frequency  Min 2X/week        Progress Toward Goals  OT Goals(current goals can now be found in the care plan section)  Progress towards OT goals: Progressing toward goals  Acute Rehab OT Goals Patient Stated Goal: see my dog OT Goal Formulation: With patient Time For Goal Achievement: 11/15/20 Potential to Achieve Goals: Good  Plan Discharge plan remains appropriate    Co-evaluation                 AM-PAC OT "6 Clicks" Daily Activity     Outcome Measure   Help from another person eating meals?: None Help from another person taking care of personal grooming?: None Help from another person toileting, which includes using toliet, bedpan, or urinal?: A Little Help from another person bathing (including washing, rinsing, drying)?: A Little Help from another person to put on and taking off regular upper body clothing?: A Little Help from another person to put on  and taking off regular lower body clothing?: A Little 6 Click Score: 20    End of Session    OT Visit Diagnosis: Pain;Other abnormalities of gait and mobility (R26.89);Unsteadiness on feet (R26.81)   Activity Tolerance Patient tolerated treatment well   Patient Left in chair;with call bell/phone within reach;with family/visitor present   Nurse Communication Mobility status        Time: 5400-8676 OT Time Calculation (min): 21 min  Charges: OT General Charges $OT Visit: 1 Visit OT Treatments $Self Care/Home Management : 8-22 mins  Derl Barrow, OTR/L [place]  Office [phone] Pager: [place] 11/03/2020, 11:30 AM

## 2020-11-04 ENCOUNTER — Telehealth: Payer: Self-pay | Admitting: Cardiovascular Disease

## 2020-11-04 DIAGNOSIS — C180 Malignant neoplasm of cecum: Secondary | ICD-10-CM | POA: Diagnosis not present

## 2020-11-04 LAB — COMPREHENSIVE METABOLIC PANEL
ALT: 19 U/L (ref 0–44)
AST: 17 U/L (ref 15–41)
Albumin: 2.9 g/dL — ABNORMAL LOW (ref 3.5–5.0)
Alkaline Phosphatase: 84 U/L (ref 38–126)
Anion gap: 7 (ref 5–15)
BUN: 11 mg/dL (ref 8–23)
CO2: 28 mmol/L (ref 22–32)
Calcium: 8.7 mg/dL — ABNORMAL LOW (ref 8.9–10.3)
Chloride: 104 mmol/L (ref 98–111)
Creatinine, Ser: 0.5 mg/dL (ref 0.44–1.00)
GFR, Estimated: >60 mL/min (ref >60)
Glucose, Bld: 106 mg/dL — ABNORMAL HIGH (ref 70–99)
Potassium: 2.6 mmol/L — CL (ref 3.5–5.1)
Sodium: 139 mmol/L (ref 135–145)
Total Bilirubin: 1.9 mg/dL — ABNORMAL HIGH (ref 0.3–1.2)
Total Protein: 5.6 g/dL — ABNORMAL LOW (ref 6.5–8.1)

## 2020-11-04 LAB — CBC WITH DIFFERENTIAL/PLATELET
Abs Immature Granulocytes: 0.07 10*3/uL (ref 0.00–0.07)
Basophils Absolute: 0 10*3/uL (ref 0.0–0.1)
Basophils Relative: 0 %
Eosinophils Absolute: 0 10*3/uL (ref 0.0–0.5)
Eosinophils Relative: 0 %
HCT: 33.6 % — ABNORMAL LOW (ref 36.0–46.0)
Hemoglobin: 9.9 g/dL — ABNORMAL LOW (ref 12.0–15.0)
Immature Granulocytes: 1 %
Lymphocytes Relative: 6 %
Lymphs Abs: 0.7 10*3/uL (ref 0.7–4.0)
MCH: 23.5 pg — ABNORMAL LOW (ref 26.0–34.0)
MCHC: 29.5 g/dL — ABNORMAL LOW (ref 30.0–36.0)
MCV: 79.8 fL — ABNORMAL LOW (ref 80.0–100.0)
Monocytes Absolute: 0.7 10*3/uL (ref 0.1–1.0)
Monocytes Relative: 6 %
Neutro Abs: 10.1 10*3/uL — ABNORMAL HIGH (ref 1.7–7.7)
Neutrophils Relative %: 87 %
Platelets: 281 10*3/uL (ref 150–400)
RBC: 4.21 MIL/uL (ref 3.87–5.11)
RDW: 22.8 % — ABNORMAL HIGH (ref 11.5–15.5)
WBC: 11.6 10*3/uL — ABNORMAL HIGH (ref 4.0–10.5)
nRBC: 0 % (ref 0.0–0.2)

## 2020-11-04 LAB — BASIC METABOLIC PANEL
Anion gap: 7 (ref 5–15)
BUN: 11 mg/dL (ref 8–23)
CO2: 26 mmol/L (ref 22–32)
Calcium: 8.9 mg/dL (ref 8.9–10.3)
Chloride: 102 mmol/L (ref 98–111)
Creatinine, Ser: 0.53 mg/dL (ref 0.44–1.00)
GFR, Estimated: >60 mL/min (ref >60)
Glucose, Bld: 121 mg/dL — ABNORMAL HIGH (ref 70–99)
Potassium: 3.2 mmol/L — ABNORMAL LOW (ref 3.5–5.1)
Sodium: 135 mmol/L (ref 135–145)

## 2020-11-04 LAB — PHOSPHORUS: Phosphorus: 1.7 mg/dL — ABNORMAL LOW (ref 2.5–4.6)

## 2020-11-04 LAB — MAGNESIUM: Magnesium: 2 mg/dL (ref 1.7–2.4)

## 2020-11-04 MED ORDER — POTASSIUM CHLORIDE 10 MEQ/100ML IV SOLN
10.0000 meq | INTRAVENOUS | Status: AC
  Administered 2020-11-04 (×6): 10 meq via INTRAVENOUS
  Filled 2020-11-04 (×4): qty 100

## 2020-11-04 MED ORDER — POTASSIUM CHLORIDE 10 MEQ/100ML IV SOLN
INTRAVENOUS | Status: AC
  Filled 2020-11-04: qty 100

## 2020-11-04 MED ORDER — DILTIAZEM HCL ER COATED BEADS 180 MG PO CP24
180.0000 mg | ORAL_CAPSULE | Freq: Every day | ORAL | Status: DC
  Administered 2020-11-04 – 2020-11-10 (×7): 180 mg via ORAL
  Filled 2020-11-04 (×7): qty 1

## 2020-11-04 MED ORDER — METOPROLOL TARTRATE 50 MG PO TABS
50.0000 mg | ORAL_TABLET | Freq: Two times a day (BID) | ORAL | Status: DC
  Administered 2020-11-04 – 2020-11-10 (×12): 50 mg via ORAL
  Filled 2020-11-04 (×12): qty 1

## 2020-11-04 MED ORDER — POTASSIUM PHOSPHATES 15 MMOLE/5ML IV SOLN
10.0000 mmol | Freq: Once | INTRAVENOUS | Status: AC
  Administered 2020-11-04: 10 mmol via INTRAVENOUS
  Filled 2020-11-04: qty 3.33

## 2020-11-04 MED ORDER — KCL-LACTATED RINGERS-D5W 20 MEQ/L IV SOLN
INTRAVENOUS | Status: DC
  Filled 2020-11-04 (×4): qty 1000

## 2020-11-04 NOTE — Progress Notes (Signed)
Nutrition Follow-up  DOCUMENTATION CODES:   Not applicable  INTERVENTION:  - diet re-advancement as medically feasible.   NUTRITION DIAGNOSIS:   Increased nutrient needs related to acute illness as evidenced by estimated needs. -ongoing  GOAL:   Patient will meet greater than or equal to 90% of their needs -unmet/unable to meet at this time  MONITOR:   Diet advancement,Labs,Weight trends  ASSESSMENT:   86 y.o. female with medical history of A.Fib on Eliquis and HTN. She presented to the ED for evaluation of anemia and SOB which has been ongoing since 06/2020.  Initial RD assessment done remotely on 5/15. At that time, she was on FLD. She was made NPO the AM of 5/16, advanced to CLD that day and FLD and then Soft on 5/17, returned to CLD on 5/18 at 1135, and then NPO since 5/18 at 1653.  The only documented PO intakes in the past 1 week were 45% of dinner on 5/13 (FLD) and 100% of breakfast on 5/17 (FLD).  NGT placed on 5/18 and remains at this time d/t post-op ileus. Clamping trials began today and patient able to have ice chips and sips of water. Notes indicate hope for NGT removal 5/21.  Patient laying in bed with no family or visitors present. She denies abdominal pain/pressure or nausea. She has had a few sips of water and reports it caused her to burp. Reports hx of reflux and that she burps with nearly all PO intake at baseline.  For an unknown amount of time she has had diarrhea at least once/day. She takes probiotic daily and lomotil if she is going to be in public or with friends; does not take lomotil if she is staying home.  Her husband died in 04-22-2020 and she lost 10 lb at that time and then weight has been stable since.   Weight on 5/11 was 145 lb, weight on 5/19 was 151 lb, and weight today is 143 lb.   She is POD #4 lap-assisted R hemicolectomy d/t ascending colon mass (adenocarcinoma) and multiple polyps.   Labs reviewed; K: 2.6 mmol/l, Phos: 1.7 mg/dl, Ca:  8.7 mg/dl. Medications reviewed; 10 mmol IV KPhos x1 run 5/20, 30 mmol IV NaPhos x1 run 5/19. IVF; D5-20 mEq IV KCl @ 75 ml/hr (306 kcal/24 hrs).    NUTRITION - FOCUSED PHYSICAL EXAM:  Flowsheet Row Most Recent Value  Orbital Region No depletion  Upper Arm Region No depletion  Thoracic and Lumbar Region Unable to assess  Buccal Region No depletion  Temple Region No depletion  Clavicle Bone Region Mild depletion  Clavicle and Acromion Bone Region Mild depletion  Scapular Bone Region Unable to assess  Dorsal Hand No depletion  Patellar Region No depletion  Anterior Thigh Region Unable to assess  Posterior Calf Region No depletion  Edema (RD Assessment) None  Hair Reviewed  Eyes Reviewed  Mouth Reviewed  Skin Reviewed  Nails Reviewed       Diet Order:   Diet Order            Diet NPO time specified Except for: Ice Chips, Sips with Meds, Other (See Comments)  Diet effective now                 EDUCATION NEEDS:   No education needs have been identified at this time  Skin:  Skin Assessment: Reviewed RN Assessment  Last BM:  5/20 (type 7 x2)  Height:   Ht Readings from Last 1 Encounters:  10/26/20 5'  4" (1.626 m)    Weight:   Wt Readings from Last 1 Encounters:  11/04/20 64.8 kg    Estimated Nutritional Needs:  Kcal:  1700-1900 kcal Protein:  80-95 grams Fluid:  >/= 2 L/day      Jarome Matin, MS, RD, LDN, CNSC Inpatient Clinical Dietitian RD pager # available in [facility]  After hours/weekend pager # available in [facility]

## 2020-11-04 NOTE — Plan of Care (Signed)
  Problem: Clinical Measurements: Goal: Ability to maintain clinical measurements within normal limits will improve Outcome: Progressing   Problem: Clinical Measurements: Goal: Diagnostic test results will improve Outcome: Progressing   Problem: Activity: Goal: Risk for activity intolerance will decrease Outcome: Progressing   Problem: Pain Managment: Goal: General experience of comfort will improve Outcome: Progressing   Problem: Safety: Goal: Ability to remain free from injury will improve Outcome: Progressing   

## 2020-11-04 NOTE — Telephone Encounter (Signed)
Called and spoke with daughter and patient. She states that she just wanted Dr.[place] to be made aware that she is in the hospital and requested to talk to [place]. I attempted to help patient but she wanted to only speak with [facility]. Patient was thankful for call back.  Will route to primary nurse.

## 2020-11-04 NOTE — Progress Notes (Signed)
Path pT2pN0 = Stage 1 colon cancer. I told the pt the good news (daughter was on the phone as well) Copy of pathology in room   Adin Hector, MD, FACS, MASCRS  Esophageal, Gastrointestinal & Colorectal Surgery Robotic and Minimally Invasive Surgery [facility] [address], [place], [place] [address] [phone] Fax [phone] Main/Paging  CONTACT INFORMATION: Weekday (9AM-5PM) concerns: Call [facility] main office at [phone] Weeknight (5PM-9AM) or Weekend/Holiday concerns: Check [email or web address] for General Surgery [facility] coverage (Please, do not use SecureChat as it is not reliable communication to operating surgeons for immediate patient care)

## 2020-11-04 NOTE — Telephone Encounter (Signed)
Called patient to reschedule her appointment due to change in provider's schedule. She states she is currently in the hospital and requests Rip Harbour call her back to discuss why she is there.

## 2020-11-04 NOTE — Progress Notes (Signed)
Physical Therapy Treatment Patient Details Name: Robin [name] MRN: [id number] DOB: August 03, 1934 Today's Date: 11/04/2020    History of Present Illness 86 year old F presented to ED for anemia and shortness of breath, and admitted for blood loss anemia due to GI bleed.  Hgb 8.5 (reportedly 11 in January 2022 per daughter).  Hemoccult positive.  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 s/p laparoscopic hemicolectomy 10/31/20. Pt  with PMH of A. fib on Eliquis and HTN.    PT Comments    Pt assisted with ambulating in hallway and tolerated improved distance today.  Pt agreeable to stay remain OOB in recliner end of session.  Daughter present for session.   Follow Up Recommendations  No PT follow up     Equipment Recommendations  None recommended by PT    Recommendations for Other Services       Precautions / Restrictions Precautions Precautions: Other (comment) Precaution Comments: abdominal surgery, NG    Mobility  Bed Mobility Overal bed mobility: Needs Assistance Bed Mobility: Supine to Sit Rolling: Supervision Sidelying to sit: Supervision;HOB elevated            Transfers Overall transfer level: Needs assistance Equipment used: 1 person hand held assist Transfers: Sit to/from Stand Sit to Stand: Min guard         General transfer comment: pt requested HHA, min/guard for safety/lines  Ambulation/Gait Ambulation/Gait assistance: Min guard Gait Distance (Feet): 160 Feet Assistive device: IV Pole Gait Pattern/deviations: Step-through pattern;Decreased stride length Gait velocity: decreased   General Gait Details: pt pushed IV pole with both hands, slow and steady, distance to tolerance, HR 110 bpm   Stairs             Wheelchair Mobility    Modified Rankin (Stroke Patients Only)       Balance                                             Cognition Arousal/Alertness: Awake/alert Behavior During Therapy: WFL for tasks assessed/performed Overall Cognitive Status: Within Functional Limits for tasks assessed                                        Exercises      General Comments        Pertinent Vitals/Pain Pain Assessment: Faces Faces Pain Scale: Hurts a little bit Pain Location: abdomen with mobility Pain Descriptors / Indicators: Sore;Tender Pain Intervention(s): Monitored during session;Repositioned    Home Living                      Prior Function            PT Goals (current goals can now be found in the care plan section) Progress towards PT goals: Progressing toward goals    Frequency    Min 3X/week      PT Plan Current plan remains appropriate    Co-evaluation              AM-PAC PT "6 Clicks" Mobility   Outcome Measure  Help needed turning from your back to your side while in a flat bed without using bedrails?: A Little Help needed moving from lying on your back to  sitting on the side of a flat bed without using bedrails?: A Little Help needed moving to and from a bed to a chair (including a wheelchair)?: A Little Help needed standing up from a chair using your arms (e.g., wheelchair or bedside chair)?: A Little Help needed to walk in hospital room?: A Little Help needed climbing 3-5 steps with a railing? : A Little 6 Click Score: 18    End of Session   Activity Tolerance: Patient tolerated treatment well Patient left: in chair;with call bell/phone within reach;with family/visitor present;with nursing/sitter in room Nurse Communication: Mobility status PT Visit Diagnosis: Difficulty in walking, not elsewhere classified (R26.2)     Time: 1435-1450 PT Time Calculation (min) (ACUTE ONLY): 15 min  Charges:  $Gait Training: 8-22 mins                     Arlyce Dice, DPT Acute Rehabilitation Services Pager: [phone] Office:  [phone]  York Ram E 11/04/2020, 3:08 PM

## 2020-11-04 NOTE — Progress Notes (Signed)
PROGRESS NOTE    Robin [name]  KGU:[id number] DOB: Oct 08, 1934 DOA: 10/26/2020 PCP: [name], PA-C   No chief complaint on file.  Brief Narrative:  86 year old F with PMH of Robin [name]. fib on Eliquis and HTN presented to ED for anemia and shortness of breath, and admitted for blood loss anemia due to GI bleed.  Hgb 8.5 (reportedly 11 in January 2022 per daughter).  Hemoccult positive.  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  Colonoscopy on 5/13 confirmed malignant tumor adjacent to the IC valve. Pathology from proximal mass with invasive adenocarcinoma.  Pathology of polyp from transverse colon and splenic flexure with at least high-grade dysplasia. MRI abdomen with and without contrast without significant finding within the visualized portion of the abdomen. Patient underwent laparoscopic proximal hemicolectomy with reanastomosis on 5/16.  Post op treatment now per surgery.  Assessment & Plan:   Principal Problem:   Cecal cancer & polyps s/p lap hemicolectomy 10/31/2020 Active Problems:   Paroxysmal atrial fibrillation (HCC)   Chronic anticoagulation   Anemia   GI bleed   Colonic mass   High grade dysplasia in colonic adenomas transverse colon & splenic flexure   Colon cancer (HCC)   Hypophosphatemia  Adenocarcinoma of proximal colon with ileocecal adenopathy: Noted on CT abdomen and pelvis and colonoscopy. MRI as above.  Pathology from proximal mass with invasive adenocarcinoma.  Pathology of polyp from transverse colon and splenic flexure with at least high-grade dysplasia.  -S/p laparoscopic proximal hemicolectomy with reanastomosis by Dr. Johney Maine on 5/16 -General surgery managing -Follow surgical pathology -> invasive moderately differentiated adenocarcinoma with mucinous features, tumor invades muscularis propia, margins of resction not involved, 21 LN's negative for carcinoma.  - Dr. Johney Maine  planning for patient to follow up with tumor board - Will need outpatient oncology follow up  Nausea  Vomiting  Ileus  - plain film with dilated small bowel loobs concerning for distal small bowel obstruction or ileus - NGT clamped today, sips of clears - hopefully can be removed tomorrow - per surgery, appreciate assistance  Severe iron deficiency anemia due to acute on chronic blood loss from GI bleed: FOBT positive.  Hgb 13.7 in 2019, and reportedly about 12 in January 2022 per daughter. - Hb relatively stable today, follow - labs c/w iron def anemia, low normal B12 (follow MMA)  Paroxysmal [name]. Fib with RVR: On Toprol-XL, Cardizem and Eliquis at home.  CHA2DS2-VASc score 4 (age, sex, HTN) - rate in 69's again today, better controlled, but AV nodal blockers have been held PO -currently on dilt gtt -> as NGT now clamped, will resume her home meds and d/c dilt gtt -Resume Eliquis once okay from surgical perspective  Essential hypertension: Normotensive. -metop and diltiazide on hold - receiving IV dilt -> transition back today as above -Continue holding Maxzide for now  Hypokalemia/hypophosphatemia: Hypokalemia could be due to HCTZ.  Resolved. - 2/2 NG output, NPO status  Leukocytosis - reactive? Continue to monitor - improving  DVT prophylaxis: lovenox Code Status: full Family Communication: daughter over phone in room - 5/20 Disposition:   Status is: Inpatient  Remains inpatient appropriate because:Inpatient level of care appropriate due to severity of illness   Dispo: The patient is from: Home              Anticipated d/c is to: pending              Patient currently is not medically stable  to d/c.   Difficult to place patient No       Consultants:   Surgery  GI  Procedures:  5/16 PROCEDURE:   LAPAROSCOPIC PROXIMAL HEMICOLECTOMY  OMENTOPEXY OF ANASTOMOSIS LAPAROSCOPIC LYSIS OF ADHESIONS TRANSVERSUS ABDOMINIS PLANE (TAP) BLOCK -  BILATERAL  Colonoscopy - Obvious malignant tumor adjacent to the IC valve. Biopsied. - Several polyps were also removed (mixed snare cautery and cold snare). The largest was located at the presumed splenic flexure, removed piecemeal, and although it was soft and not overtly malignant I labled the site with submucosal Spot injections given it's size (2.5cm). - Return patient to hospital ward for ongoing care. - Will need staging tests with CT scan chest, abd, pelvis. CEA. - I recommend continuing to hold eliquis for now. Await pathology results. - General surgery has already been consulted. -  GI will return to see her on Monday. Drs. Collene Mares and Benson Norway are covering this weekend if needed.  Antimicrobials: Anti-infectives (From admission, onward)   Start     Dose/Rate Route Frequency Ordered Stop   10/31/20 2200  cefoTEtan (CEFOTAN) 2 g in sodium chloride 0.9 % 100 mL IVPB        2 g 200 mL/hr over 30 Minutes Intravenous Every 12 hours 10/31/20 1744 10/31/20 2205   10/31/20 0915  cefoTEtan (CEFOTAN) 2 g in sodium chloride 0.9 % 100 mL IVPB        2 g 200 mL/hr over 30 Minutes Intravenous On call to O.R. 10/31/20 0824 10/31/20 1312        Subjective: Anxious overnight with heartrate   Objective: Vitals:   11/04/20 0410 11/04/20 0540 11/04/20 0600 11/04/20 1300  BP:   (!) 143/76 (!) 146/77  Pulse: (!) 104  93 89  Resp: 17  20 (!) 24  Temp:    99.5 F (37.5 C)  TempSrc:    Oral  SpO2: 96%  94% 96%  Weight:  64.8 kg    Height:        Intake/Output Summary (Last 24 hours) at 11/04/2020 1602 Last data filed at 11/04/2020 1400 Gross per 24 hour  Intake 4052.69 ml  Output 450 ml  Net 3602.69 ml   Filed Weights   10/26/20 1729 11/03/20 0500 11/04/20 0540  Weight: 66 kg 68.5 kg 64.8 kg    Examination:  General: No acute distress. Cardiovascular: irregularly irregular Lungs: Clear to auscultation bilaterally  Abdomen: Soft, appropriately tender, nondistended -  surgical dressing in place Neurological: Alert and oriented 3. Moves all extremities 4. Cranial nerves II through XII grossly intact. Skin: Warm and dry. No rashes or lesions. Extremities: No clubbing or cyanosis. No edema.  Data Reviewed: I have personally reviewed following labs and imaging studies  CBC: Recent Labs  Lab 10/29/20 0626 10/30/20 0525 10/31/20 0525 11/01/20 0514 11/02/20 0508 11/03/20 0523 11/04/20 0425  WBC 7.9 8.6  --  17.0* 23.2* 17.7* 11.6*  NEUTROABS 5.5  --   --   --   --  15.8* 10.1*  HGB 7.9* 8.4* 9.8* 10.0* 10.5* 10.3* 9.9*  HCT 28.0* 29.7* 33.2* 33.7* 34.7* 34.8* 33.6*  MCV 75.5* 76.2*  --  78.2* 76.4* 78.0* 79.8*  PLT 340 344  --  294 369 333 [id number]    Basic Metabolic Panel: Recent Labs  Lab 10/30/20 0525 10/31/20 0525 11/01/20 0514 11/02/20 0508 11/03/20 0523 11/04/20 0425  NA 136  --  133* 131* 134* 139  K 4.3 3.6 4.0 3.7 3.5 2.6*  CL 104  --  100 96* 100 104  CO2 27  --  24 27 26 28   GLUCOSE 102*  --  158* 137* 114* 106*  BUN 11  --  12 17 13 11   CREATININE 0.60  --  0.65 0.70 0.64 0.50  CALCIUM 9.9  --  9.3 9.8 9.5 8.7*  MG 2.0 2.2 1.8 2.2 2.2 2.0  PHOS 2.4* 3.1  --  1.8* 1.6* 1.7*    GFR: Estimated Creatinine Clearance: 46 mL/min (by C-G formula based on SCr of 0.5 mg/dL).  Liver Function Tests: Recent Labs  Lab 10/30/20 0525 11/02/20 0508 11/03/20 0523 11/04/20 0425  AST  --   --  16 17  ALT  --   --  19 19  ALKPHOS  --   --  79 84  BILITOT  --   --  1.9* 1.9*  PROT  --   --  5.9* 5.6*  ALBUMIN 3.3* 3.4* 3.3* 2.9*    CBG: No results for input(s): GLUCAP in the last 168 hours.   Recent Results (from the past 240 hour(s))  Resp Panel by RT-PCR (Flu Worth Kober&B, Covid) Nasopharyngeal Swab     Status: None   Collection Time: 10/26/20 10:00 PM   Specimen: Nasopharyngeal Swab; Nasopharyngeal(NP) swabs in vial transport medium  Result Value Ref Range Status   SARS Coronavirus 2 by RT PCR NEGATIVE NEGATIVE Final    Comment:  (NOTE) SARS-CoV-2 target nucleic acids are NOT DETECTED.  The SARS-CoV-2 RNA is generally detectable in upper respiratory specimens during the acute phase of infection. The lowest concentration of SARS-CoV-2 viral copies this assay can detect is 138 copies/mL. Kamorah Nevils negative result does not preclude SARS-Cov-2 infection and should not be used as the sole basis for treatment or other patient management decisions. Laken Rog negative result may occur with  improper specimen collection/handling, submission of specimen other than nasopharyngeal swab, presence of viral mutation(s) within the areas targeted by this assay, and inadequate number of viral copies(<138 copies/mL). Randie Tallarico negative result must be combined with clinical observations, patient history, and epidemiological information. The expected result is Negative.  Fact Sheet for Patients:  EntrepreneurPulse.com.au  Fact Sheet for Healthcare Providers:  IncredibleEmployment.be  This test is no t yet approved or cleared by the [place] FDA and  has been authorized for detection and/or diagnosis of SARS-CoV-2 by FDA under an Emergency Use Authorization (EUA). This EUA will remain  in effect (meaning this test can be used) for the duration of the COVID-19 declaration under Section 564(b)(1) of the Act, 21 U.S.C.section 360bbb-3(b)(1), unless the authorization is terminated  or revoked sooner.       Influenza Yunus Stoklosa by PCR NEGATIVE NEGATIVE Final   Influenza B by PCR NEGATIVE NEGATIVE Final    Comment: (NOTE) The Xpert Xpress SARS-CoV-2/FLU/RSV plus assay is intended as an aid in the diagnosis of influenza from Nasopharyngeal swab specimens and should not be used as Mihcael Ledee sole basis for treatment. Nasal washings and aspirates are unacceptable for Xpert Xpress SARS-CoV-2/FLU/RSV testing.  Fact Sheet for Patients: EntrepreneurPulse.com.au  Fact Sheet for Healthcare  Providers: IncredibleEmployment.be  This test is not yet approved or cleared by the [place] FDA and has been authorized for detection and/or diagnosis of SARS-CoV-2 by FDA under an Emergency Use Authorization (EUA). This EUA will remain in effect (meaning this test can be used) for the duration of the COVID-19 declaration under Section 564(b)(1) of the Act, 21 U.S.C. section 360bbb-3(b)(1), unless the authorization is terminated or revoked.  Performed at [facility]  [place] [address] [place], [place] [address]   Surgical pcr screen     Status: None   Collection Time: 10/31/20 10:19 AM   Specimen: Nasal Mucosa; Nasal Swab  Result Value Ref Range Status   MRSA, PCR NEGATIVE NEGATIVE Final   Staphylococcus aureus NEGATIVE NEGATIVE Final    Comment: (NOTE) The Xpert SA Assay (FDA approved for NASAL specimens in patients 50 years of age and older), is one component of Jeyren Danowski comprehensive surveillance program. It is not intended to diagnose infection nor to guide or monitor treatment. Performed at [facility], [place] [address], [place], [place] [address]          Radiology Studies: DG Abd 1 View  Result Date: 11/02/2020 CLINICAL DATA:  NG tube placement EXAM: ABDOMEN - 1 VIEW COMPARISON:  11/02/2020 FINDINGS: Esophageal tube tip and side port overlie the proximal stomach. Partially visualized dilated air distended bowel. IMPRESSION: Esophageal tube tip and side port overlie the proximal stomach Electronically Signed   By: Donavan Foil M.D.   On: 11/02/2020 20:20        Scheduled Meds: . enoxaparin (LOVENOX) injection  40 mg Subcutaneous Q24H  . simethicone  40 mg Oral QID   Continuous Infusions: . dextrose 5% lactated ringers with KCl 20 mEq/L 75 mL/hr at 11/04/20 0824  . diltiazem (CARDIZEM) infusion 15 mg/hr (11/04/20 1018)  . methocarbamol (ROBAXIN) IV    . potassium PHOSPHATE IVPB (in mmol)       LOS: 8  days    Time spent: over 30 min    Fayrene Helper, MD [facility]   To contact the attending provider between 7A-7P or the covering provider during after hours 7P-7A, please log into the web site [email or web address] and access using universal [place] password for that web site. If you do not have the password, please call the hospital operator.  11/04/2020, 4:02 PM

## 2020-11-04 NOTE — Telephone Encounter (Signed)
Spoke with patient, just wanted to let us know she was currently in the hospital

## 2020-11-04 NOTE — Progress Notes (Signed)
4 Days Post-Op  Subjective: Feels weak today but ambulated twice yesterday.  Had 2 large BMs today.  Only about 250cc of NG output in last 12 hrs.  ROS: See above, otherwise other systems negative  Objective: Vital signs in last 24 hours: Temp:  [98.3 F (36.8 C)-98.7 F (37.1 C)] 98.7 F (37.1 C) (05/20 0250) Pulse Rate:  [82-119] 93 (05/20 0600) Resp:  [16-22] 20 (05/20 0600) BP: (135-159)/(63-86) 143/76 (05/20 0600) SpO2:  [89 %-97 %] 94 % (05/20 0600) Weight:  [64.8 kg] 64.8 kg (05/20 0540) Last BM Date: 11/02/20  Intake/Output from previous day: 05/19 0701 - 05/20 0700 In: 3144 [I.V.:3144] Out: 850 [Urine:450; Emesis/NG output:400] Intake/Output this shift: No intake/output data recorded.  PE: Heart: mildly tachy Lungs: CTAB Abd: soft, + BS, appropriately tender, incisions c/d/i, less distention, NGT with only about 250cc of bilious output  Lab Results:  Recent Labs    11/03/20 0523 11/04/20 0425  WBC 17.7* 11.6*  HGB 10.3* 9.9*  HCT 34.8* 33.6*  PLT 333 281   BMET Recent Labs    11/03/20 0523 11/04/20 0425  NA 134* 139  K 3.5 2.6*  CL 100 104  CO2 26 28  GLUCOSE 114* 106*  BUN 13 11  CREATININE 0.64 0.50  CALCIUM 9.5 8.7*   PT/INR No results for input(s): LABPROT, INR in the last 72 hours. CMP     Component Value Date/Time   NA 139 11/04/2020 0425   NA 139 02/21/2018 0854   K 2.6 (LL) 11/04/2020 0425   CL 104 11/04/2020 0425   CO2 28 11/04/2020 0425   GLUCOSE 106 (H) 11/04/2020 0425   BUN 11 11/04/2020 0425   BUN 20 02/21/2018 0854   CREATININE 0.50 11/04/2020 0425   CREATININE 0.54 (L) 11/28/2015 1422   CALCIUM 8.7 (L) 11/04/2020 0425   PROT 5.6 (L) 11/04/2020 0425   ALBUMIN 2.9 (L) 11/04/2020 0425   AST 17 11/04/2020 0425   ALT 19 11/04/2020 0425   ALKPHOS 84 11/04/2020 0425   BILITOT 1.9 (H) 11/04/2020 0425   GFRNONAA >60 11/04/2020 0425   GFRAA 78 02/21/2018 0854   Lipase     Component Value Date/Time   LIPASE 31  10/26/2020 1919       Studies/Results: DG Abd 1 View  Result Date: 11/02/2020 CLINICAL DATA:  NG tube placement EXAM: ABDOMEN - 1 VIEW COMPARISON:  11/02/2020 FINDINGS: Esophageal tube tip and side port overlie the proximal stomach. Partially visualized dilated air distended bowel. IMPRESSION: Esophageal tube tip and side port overlie the proximal stomach Electronically Signed   By: Donavan Foil M.D.   On: 11/02/2020 20:20   DG Abd Portable 1V  Result Date: 11/02/2020 CLINICAL DATA:  Ileus. EXAM: PORTABLE ABDOMEN - 1 VIEW COMPARISON:  Oct 28, 2020. FINDINGS: Status post cholecystectomy. No colonic dilatation. Dilated small bowel loops are seen centrally and in the right abdomen concerning for distal small bowel obstruction or ileus. No abnormal calcifications are noted IMPRESSION: Dilated small bowel loops are noted concerning for distal small bowel obstruction or ileus. Electronically Signed   By: Marijo Conception M.D.   On: 11/02/2020 10:30    Anti-infectives: Anti-infectives (From admission, onward)   Start     Dose/Rate Route Frequency Ordered Stop   10/31/20 2200  cefoTEtan (CEFOTAN) 2 g in sodium chloride 0.9 % 100 mL IVPB        2 g 200 mL/hr over 30 Minutes Intravenous Every 12 hours 10/31/20 1744 10/31/20  2205   10/31/20 0915  cefoTEtan (CEFOTAN) 2 g in sodium chloride 0.9 % 100 mL IVPB        2 g 200 mL/hr over 30 Minutes Intravenous On call to O.R. 10/31/20 0824 10/31/20 1312       Assessment/Plan Iron deficiency anemia - likely secondary to chronic GI losses, hgb10.5 Atrial fibrillation on eliquis - LD5/11 AM HTN HLD  POD 4, s/p lap assisted right hemicolectomy for ascending colon mass (adenocarcinoma) and multiple polyps (likely pre-malignant), Dr. Johney Maine 10/31/20 -path: A. COLON, PROXIMAL AND TRANSVERSE, RESECTION:  - Invasive moderately differentiated adenocarcinoma with mucinous  features, 4 cm.  - Tumor invades the muscularis propria.  - Margins of resection  are not involved.  - Twenty-one lymph nodes, negative for carcinoma (0/21).   -ileus seems to be improving.  Clamp NGT today and may have a few sips of clears today.  Hopefully can remove NGT tomorrow -cont to mobilization/pulm toilet/IS -WBC 11.6 today and resolving -K being replaced by primary service -may follow up with onc as outpatient  FEN - clamp NGT, few sips of clears VTE - Lovenox  ID - Cefotetan pre-op   LOS: 8 days    Henreitta Cea , [facility] 11/04/2020, 9:54 AM Please see Amion for pager number during day hours 7:00am-4:30pm or 7:00am -11:30am on weekends

## 2020-11-05 DIAGNOSIS — C180 Malignant neoplasm of cecum: Secondary | ICD-10-CM | POA: Diagnosis not present

## 2020-11-05 LAB — COMPREHENSIVE METABOLIC PANEL
ALT: 20 U/L (ref 0–44)
AST: 15 U/L (ref 15–41)
Albumin: 2.7 g/dL — ABNORMAL LOW (ref 3.5–5.0)
Alkaline Phosphatase: 95 U/L (ref 38–126)
Anion gap: 7 (ref 5–15)
BUN: 11 mg/dL (ref 8–23)
CO2: 26 mmol/L (ref 22–32)
Calcium: 8.8 mg/dL — ABNORMAL LOW (ref 8.9–10.3)
Chloride: 102 mmol/L (ref 98–111)
Creatinine, Ser: 0.41 mg/dL — ABNORMAL LOW (ref 0.44–1.00)
GFR, Estimated: >60 mL/min (ref >60)
Glucose, Bld: 156 mg/dL — ABNORMAL HIGH (ref 70–99)
Potassium: 3.3 mmol/L — ABNORMAL LOW (ref 3.5–5.1)
Sodium: 135 mmol/L (ref 135–145)
Total Bilirubin: 1.5 mg/dL — ABNORMAL HIGH (ref 0.3–1.2)
Total Protein: 5.4 g/dL — ABNORMAL LOW (ref 6.5–8.1)

## 2020-11-05 LAB — CBC WITH DIFFERENTIAL/PLATELET
Abs Immature Granulocytes: 0.13 10*3/uL — ABNORMAL HIGH (ref 0.00–0.07)
Basophils Absolute: 0 10*3/uL (ref 0.0–0.1)
Basophils Relative: 0 %
Eosinophils Absolute: 0 10*3/uL (ref 0.0–0.5)
Eosinophils Relative: 0 %
HCT: 33.9 % — ABNORMAL LOW (ref 36.0–46.0)
Hemoglobin: 10 g/dL — ABNORMAL LOW (ref 12.0–15.0)
Immature Granulocytes: 1 %
Lymphocytes Relative: 7 %
Lymphs Abs: 0.7 10*3/uL (ref 0.7–4.0)
MCH: 23.4 pg — ABNORMAL LOW (ref 26.0–34.0)
MCHC: 29.5 g/dL — ABNORMAL LOW (ref 30.0–36.0)
MCV: 79.4 fL — ABNORMAL LOW (ref 80.0–100.0)
Monocytes Absolute: 0.8 10*3/uL (ref 0.1–1.0)
Monocytes Relative: 7 %
Neutro Abs: 9.2 10*3/uL — ABNORMAL HIGH (ref 1.7–7.7)
Neutrophils Relative %: 85 %
Platelets: 304 10*3/uL (ref 150–400)
RBC: 4.27 MIL/uL (ref 3.87–5.11)
RDW: 23.5 % — ABNORMAL HIGH (ref 11.5–15.5)
WBC: 10.9 10*3/uL — ABNORMAL HIGH (ref 4.0–10.5)
nRBC: 0 % (ref 0.0–0.2)

## 2020-11-05 LAB — MAGNESIUM: Magnesium: 1.9 mg/dL (ref 1.7–2.4)

## 2020-11-05 LAB — PHOSPHORUS: Phosphorus: 1.4 mg/dL — ABNORMAL LOW (ref 2.5–4.6)

## 2020-11-05 MED ORDER — K PHOS MONO-SOD PHOS DI & MONO 155-852-130 MG PO TABS
500.0000 mg | ORAL_TABLET | Freq: Four times a day (QID) | ORAL | Status: AC
  Administered 2020-11-05 (×4): 500 mg via ORAL
  Filled 2020-11-05 (×5): qty 2

## 2020-11-05 MED ORDER — APIXABAN 2.5 MG PO TABS
2.5000 mg | ORAL_TABLET | Freq: Two times a day (BID) | ORAL | Status: AC
  Administered 2020-11-05 – 2020-11-08 (×7): 2.5 mg via ORAL
  Filled 2020-11-05 (×7): qty 1

## 2020-11-05 MED ORDER — POTASSIUM CHLORIDE CRYS ER 20 MEQ PO TBCR
40.0000 meq | EXTENDED_RELEASE_TABLET | Freq: Once | ORAL | Status: AC
  Administered 2020-11-05: 40 meq via ORAL
  Filled 2020-11-05: qty 2

## 2020-11-05 NOTE — Progress Notes (Signed)
Assessment & Plan: POD#5 - s/p lap assisted right hemicolectomy for ascending colon mass (adenocarcinoma) and multiple polyps (likely pre-malignant), Dr. Johney Maine 10/31/20 -path: A. COLON, PROXIMAL AND TRANSVERSE, RESECTION:  - Invasive moderately differentiated adenocarcinoma with mucinous  features, 4 cm.  - Tumor invades the muscularis propria.  - Margins of resection are not involved.  - Twenty-one lymph nodes, negative for carcinoma (0/21).   Iron deficiency anemia - likely secondary to chronic GI losses, hgb10.5 Atrial fibrillation on eliquis - LD5/11 AM HTN HLD  -ileus seems to be improving - discontinue NG tube this morning -begin clear liquid diet -cont to mobilization/pulm toilet/IS  FEN - clear liquid diet VTE -Lovenox  ID -Cefotetan pre-op        [name], MD       [facility]       Office: [phone]   Chief Complaint: Adenocarcinoma of colon  Subjective: Patient in bed, comfortable.  Having BM's, passing flatus.  Ambulated yesterday.  Objective: Vital signs in last 24 hours: Temp:  [98.7 F (37.1 C)-99.5 F (37.5 C)] 98.7 F (37.1 C) (05/21 0400) Pulse Rate:  [78-94] 94 (05/21 0400) Resp:  [16-24] 22 (05/21 0400) BP: (134-155)/(64-77) 149/77 (05/21 0400) SpO2:  [92 %-96 %] 94 % (05/21 0400) Weight:  [65.4 kg] 65.4 kg (05/21 0441) Last BM Date: 11/04/20  Intake/Output from previous day: 05/20 0701 - 05/21 0700 In: 2576.4 [I.V.:1800.3; IV Piggyback:776.1] Out: -  Intake/Output this shift: No intake/output data recorded.  Physical Exam: HEENT - sclerae clear, mucous membranes moist Neck - soft Chest - clear bilaterally Cor - RRR Abdomen - moderate distension; BS present on auscultation Ext - no edema, non-tender Neuro - alert & oriented, no focal deficits  Lab Results:  Recent Labs    11/04/20 0425 11/05/20 0527  WBC 11.6* 10.9*  HGB 9.9* 10.0*  HCT 33.6* 33.9*  PLT 281 304   BMET Recent Labs     11/04/20 1536 11/05/20 0527  NA 135 135  K 3.2* 3.3*  CL 102 102  CO2 26 26  GLUCOSE 121* 156*  BUN 11 11  CREATININE 0.53 0.41*  CALCIUM 8.9 8.8*   PT/INR No results for input(s): LABPROT, INR in the last 72 hours. Comprehensive Metabolic Panel:    Component Value Date/Time   NA 135 11/05/2020 0527   NA 135 11/04/2020 1536   NA 139 02/21/2018 0854   NA 137 02/27/2017 1205   K 3.3 (L) 11/05/2020 0527   K 3.2 (L) 11/04/2020 1536   CL 102 11/05/2020 0527   CL 102 11/04/2020 1536   CO2 26 11/05/2020 0527   CO2 26 11/04/2020 1536   BUN 11 11/05/2020 0527   BUN 11 11/04/2020 1536   BUN 20 02/21/2018 0854   BUN 12 02/27/2017 1205   CREATININE 0.41 (L) 11/05/2020 0527   CREATININE 0.53 11/04/2020 1536   CREATININE 0.54 (L) 11/28/2015 1422   CREATININE 0.71 08/19/2015 0905   GLUCOSE 156 (H) 11/05/2020 0527   GLUCOSE 121 (H) 11/04/2020 1536   CALCIUM 8.8 (L) 11/05/2020 0527   CALCIUM 8.9 11/04/2020 1536   AST 15 11/05/2020 0527   AST 17 11/04/2020 0425   ALT 20 11/05/2020 0527   ALT 19 11/04/2020 0425   ALKPHOS 95 11/05/2020 0527   ALKPHOS 84 11/04/2020 0425   BILITOT 1.5 (H) 11/05/2020 0527   BILITOT 1.9 (H) 11/04/2020 0425   PROT 5.4 (L) 11/05/2020 0527   PROT 5.6 (L) 11/04/2020 0425  ALBUMIN 2.7 (L) 11/05/2020 0527   ALBUMIN 2.9 (L) 11/04/2020 0425    Studies/Results: No results found.    [name] 11/05/2020  Patient ID: Robin [name], female   DOB: 1935-01-25, 86 y.o.   MRN: [id number]

## 2020-11-05 NOTE — Progress Notes (Signed)
PROGRESS NOTE    Robin [name]  GEX:[id number] DOB: 01/06/1935 DOA: 10/26/2020 PCP: [name], PA-C   No chief complaint on file.  Brief Narrative:  86 year old F with PMH of Robin [name]. fib on Eliquis and HTN presented to ED for anemia and shortness of breath, and admitted for blood loss anemia due to GI bleed.  Hgb 8.5 (reportedly 11 in January 2022 per daughter).  Hemoccult positive.  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  Colonoscopy on 5/13 confirmed malignant tumor adjacent to the IC valve. Pathology from proximal mass with invasive adenocarcinoma.  Pathology of polyp from transverse colon and splenic flexure with at least high-grade dysplasia. MRI abdomen with and without contrast without significant finding within the visualized portion of the abdomen. Patient underwent laparoscopic proximal hemicolectomy with reanastomosis on 5/16.  Post op treatment now per surgery.  Assessment & Plan:   Principal Problem:   Cecal cancer & polyps s/p lap hemicolectomy 10/31/2020 Active Problems:   Paroxysmal atrial fibrillation (HCC)   Chronic anticoagulation   Anemia   GI bleed   Colonic mass   High grade dysplasia in colonic adenomas transverse colon & splenic flexure   Colon cancer (HCC)   Hypophosphatemia  Adenocarcinoma of proximal colon with ileocecal adenopathy: Noted on CT abdomen and pelvis and colonoscopy. MRI as above.  Pathology from proximal mass with invasive adenocarcinoma.  Pathology of polyp from transverse colon and splenic flexure with at least high-grade dysplasia.  -S/p laparoscopic proximal hemicolectomy with reanastomosis by Dr. Johney Maine on 5/16 -General surgery managing -Follow surgical pathology -> invasive moderately differentiated adenocarcinoma with mucinous features, tumor invades muscularis propia, margins of resction not involved, 21 LN's negative for carcinoma.  - Dr. Johney Maine  planning for patient to follow up with tumor board - Will need outpatient oncology follow up  Nausea  Vomiting  Ileus  - plain film with dilated small bowel loobs concerning for distal small bowel obstruction or ileus - NGT d/c'd today, clear liquid diet, follow - per surgery, appreciate assistance  Severe iron deficiency anemia due to acute on chronic blood loss from GI bleed: FOBT positive.  Hgb 13.7 in 2019, and reportedly about 12 in January 2022 per daughter. - Hb relatively stable today, follow - labs c/w iron def anemia, low normal B12 (follow MMA)  Paroxysmal Robin [name]. Fib with RVR: On Toprol-XL, Cardizem and Eliquis at home.  CHA2DS2-VASc score 4 (age, sex, HTN) -rate well controlled today -continue home dilt and metop -Resume Eliquis once okay from surgical perspective -> discussed with surgery 5/20, noted ok to restart today, will start lower dose given post op and borderline weight  Essential hypertension: Normotensive. -metop and diltiazide on hold - receiving IV dilt -> transition back today as above -Continue holding Maxzide for now  Hypokalemia/hypophosphatemia: Hypokalemia could be due to HCTZ.  Resolved. - replace and follow  Leukocytosis - reactive? Continue to monitor - improving  DVT prophylaxis: lovenox Code Status: full Family Communication: daughter over phone in room - 5/20 Disposition:   Status is: Inpatient  Remains inpatient appropriate because:Inpatient level of care appropriate due to severity of illness   Dispo: The patient is from: Home              Anticipated d/c is to: pending              Patient currently is not medically stable to d/c.   Difficult to place patient No  Consultants:   Surgery  GI  Procedures:  5/16 PROCEDURE:   LAPAROSCOPIC PROXIMAL HEMICOLECTOMY  OMENTOPEXY OF ANASTOMOSIS LAPAROSCOPIC LYSIS OF ADHESIONS TRANSVERSUS ABDOMINIS PLANE (TAP) BLOCK - BILATERAL  Colonoscopy - Obvious malignant tumor adjacent  to the IC valve. Biopsied. - Several polyps were also removed (mixed snare cautery and cold snare). The largest was located at the presumed splenic flexure, removed piecemeal, and although it was soft and not overtly malignant I labled the site with submucosal Spot injections given it's size (2.5cm). - Return patient to hospital ward for ongoing care. - Will need staging tests with CT scan chest, abd, pelvis. CEA. - I recommend continuing to hold eliquis for now. Await pathology results. - General surgery has already been consulted. - [facility] will return to see her on Monday. Drs. Collene Mares and Benson Norway are covering this weekend if needed.  Antimicrobials: Anti-infectives (From admission, onward)   Start     Dose/Rate Route Frequency Ordered Stop   10/31/20 2200  cefoTEtan (CEFOTAN) 2 g in sodium chloride 0.9 % 100 mL IVPB        2 g 200 mL/hr over 30 Minutes Intravenous Every 12 hours 10/31/20 1744 10/31/20 2205   10/31/20 0915  cefoTEtan (CEFOTAN) 2 g in sodium chloride 0.9 % 100 mL IVPB        2 g 200 mL/hr over 30 Minutes Intravenous On call to O.R. 10/31/20 0824 10/31/20 1312        Subjective: No new complaints  Objective: Vitals:   11/05/20 0441 11/05/20 0900 11/05/20 0934 11/05/20 1514  BP:   (!) 149/81 137/65  Pulse:  78 84 70  Resp:  (!) 29 16 (!) 22  Temp:   98.6 F (37 C) 98.8 F (37.1 C)  TempSrc:   Oral Oral  SpO2:  95% 96% 95%  Weight: 65.4 kg     Height: 5\' 4"  (1.626 m)       Intake/Output Summary (Last 24 hours) at 11/05/2020 1535 Last data filed at 11/05/2020 1300 Gross per 24 hour  Intake 1907.76 ml  Output --  Net 1907.76 ml   Filed Weights   11/03/20 0500 11/04/20 0540 11/05/20 0441  Weight: 68.5 kg 64.8 kg 65.4 kg    Examination:  General: No acute distress. Cardiovascular: irreg irreg Lungs: Clear to auscultation bilaterally  Abdomen: Soft, nontender, mildly distended. Neurological: Alert and oriented 3. Moves all extremities 4. Cranial  nerves II through XII grossly intact. Skin: Warm and dry. No rashes or lesions. Extremities: No clubbing or cyanosis. No edema.   Data Reviewed: I have personally reviewed following labs and imaging studies  CBC: Recent Labs  Lab 11/01/20 0514 11/02/20 0508 11/03/20 0523 11/04/20 0425 11/05/20 0527  WBC 17.0* 23.2* 17.7* 11.6* 10.9*  NEUTROABS  --   --  15.8* 10.1* 9.2*  HGB 10.0* 10.5* 10.3* 9.9* 10.0*  HCT 33.7* 34.7* 34.8* 33.6* 33.9*  MCV 78.2* 76.4* 78.0* 79.8* 79.4*  PLT 294 369 333 281 604    Basic Metabolic Panel: Recent Labs  Lab 10/31/20 0525 11/01/20 0514 11/02/20 0508 11/03/20 0523 11/04/20 0425 11/04/20 1536 11/05/20 0527  NA  --  133* 131* 134* 139 135 135  K 3.6 4.0 3.7 3.5 2.6* 3.2* 3.3*  CL  --  100 96* 100 104 102 102  CO2  --  24 27 26 28 26 26   GLUCOSE  --  158* 137* 114* 106* 121* 156*  BUN  --  12 17 13 11 11  11  CREATININE  --  0.65 0.70 0.64 0.50 0.53 0.41*  CALCIUM  --  9.3 9.8 9.5 8.7* 8.9 8.8*  MG 2.2 1.8 2.2 2.2 2.0  --  1.9  PHOS 3.1  --  1.8* 1.6* 1.7*  --  1.4*    GFR: Estimated Creatinine Clearance: 46 mL/min (Evalyne Cortopassi) (by C-G formula based on SCr of 0.41 mg/dL (L)).  Liver Function Tests: Recent Labs  Lab 10/30/20 0525 11/02/20 0508 11/03/20 0523 11/04/20 0425 11/05/20 0527  AST  --   --  16 17 15   ALT  --   --  19 19 20   ALKPHOS  --   --  79 84 95  BILITOT  --   --  1.9* 1.9* 1.5*  PROT  --   --  5.9* 5.6* 5.4*  ALBUMIN 3.3* 3.4* 3.3* 2.9* 2.7*    CBG: No results for input(s): GLUCAP in the last 168 hours.   Recent Results (from the past 240 hour(s))  Resp Panel by RT-PCR (Flu Dandrea Widdowson&B, Covid) Nasopharyngeal Swab     Status: None   Collection Time: 10/26/20 10:00 PM   Specimen: Nasopharyngeal Swab; Nasopharyngeal(NP) swabs in vial transport medium  Result Value Ref Range Status   SARS Coronavirus 2 by RT PCR NEGATIVE NEGATIVE Final    Comment: (NOTE) SARS-CoV-2 target nucleic acids are NOT DETECTED.  The SARS-CoV-2 RNA  is generally detectable in upper respiratory specimens during the acute phase of infection. The lowest concentration of SARS-CoV-2 viral copies this assay can detect is 138 copies/mL. Vickki Igou negative result does not preclude SARS-Cov-2 infection and should not be used as the sole basis for treatment or other patient management decisions. Aydan Levitz negative result may occur with  improper specimen collection/handling, submission of specimen other than nasopharyngeal swab, presence of viral mutation(s) within the areas targeted by this assay, and inadequate number of viral copies(<138 copies/mL). Hazaiah Edgecombe negative result must be combined with clinical observations, patient history, and epidemiological information. The expected result is Negative.  Fact Sheet for Patients:  EntrepreneurPulse.com.au  Fact Sheet for Healthcare Providers:  IncredibleEmployment.be  This test is no t yet approved or cleared by the [place] FDA and  has been authorized for detection and/or diagnosis of SARS-CoV-2 by FDA under an Emergency Use Authorization (EUA). This EUA will remain  in effect (meaning this test can be used) for the duration of the COVID-19 declaration under Section 564(b)(1) of the Act, 21 U.S.C.section 360bbb-3(b)(1), unless the authorization is terminated  or revoked sooner.       Influenza Olamide Carattini by PCR NEGATIVE NEGATIVE Final   Influenza B by PCR NEGATIVE NEGATIVE Final    Comment: (NOTE) The Xpert Xpress SARS-CoV-2/FLU/RSV plus assay is intended as an aid in the diagnosis of influenza from Nasopharyngeal swab specimens and should not be used as Jaegar Croft sole basis for treatment. Nasal washings and aspirates are unacceptable for Xpert Xpress SARS-CoV-2/FLU/RSV testing.  Fact Sheet for Patients: EntrepreneurPulse.com.au  Fact Sheet for Healthcare Providers: IncredibleEmployment.be  This test is not yet approved or cleared by the  [place] FDA and has been authorized for detection and/or diagnosis of SARS-CoV-2 by FDA under an Emergency Use Authorization (EUA). This EUA will remain in effect (meaning this test can be used) for the duration of the COVID-19 declaration under Section 564(b)(1) of the Act, 21 U.S.C. section 360bbb-3(b)(1), unless the authorization is terminated or revoked.  Performed at [facility], [place] [address], [place], [place] [address]   Surgical pcr screen  Status: None   Collection Time: 10/31/20 10:19 AM   Specimen: Nasal Mucosa; Nasal Swab  Result Value Ref Range Status   MRSA, PCR NEGATIVE NEGATIVE Final   Staphylococcus aureus NEGATIVE NEGATIVE Final    Comment: (NOTE) The Xpert SA Assay (FDA approved for NASAL specimens in patients 76 years of age and older), is one component of Kippy Melena comprehensive surveillance program. It is not intended to diagnose infection nor to guide or monitor treatment. Performed at [facility], [place] [address], [place], [place] [address]          Radiology Studies: No results found.      Scheduled Meds: . diltiazem  180 mg Oral Daily  . enoxaparin (LOVENOX) injection  40 mg Subcutaneous Q24H  . metoprolol tartrate  50 mg Oral BID  . phosphorus  500 mg Oral QID   Continuous Infusions: . dextrose 5% lactated ringers with KCl 20 mEq/L 75 mL/hr at 11/05/20 0426  . methocarbamol (ROBAXIN) IV       LOS: 9 days    Time spent: over 30 min    Fayrene Helper, MD [facility]   To contact the attending provider between 7A-7P or the covering provider during after hours 7P-7A, please log into the web site [email or web address] and access using universal [place] password for that web site. If you do not have the password, please call the hospital operator.  11/05/2020, 3:35 PM

## 2020-11-05 NOTE — Plan of Care (Signed)
      Problem: Clinical Measurements: Goal: Will remain free from infection Outcome: Progressing   Problem: Activity: Goal: Risk for activity intolerance will decrease Outcome: Progressing   Problem: Coping: Goal: Level of anxiety will decrease  Outcome: Progressing   Problem: Nutrition: Goal: Adequate nutrition will be maintained Outcome: Progressing    Problem: Elimination: Goal: Will not experience complications related to bowel motility   Outcome: Progressing

## 2020-11-05 NOTE — Progress Notes (Signed)
ANTICOAGULATION CONSULT NOTE - Initial Consult  Pharmacy Consult for apixaban Indication: atrial fibrillation  Allergies  Allergen Reactions  . Iodinated Diagnostic Agents Shortness Of Breath  . Amlodipine     Shortness of breath  . Codeine Nausea Only  . Diphenhydramine Other (See Comments)    hypertension  . Diphenhydramine Hcl     Hypertension   . Pravastatin     Muscle aches  . Zetia [Ezetimibe]   . Sulfa Antibiotics Rash  . Sulfa Drugs Cross Reactors Rash    Patient Measurements: Height: 5\' 4"  (162.6 cm) Weight: 65.4 kg (144 lb 2.9 oz) IBW/kg (Calculated) : 54.7  Vital Signs: Temp: 98.8 F (37.1 C) (05/21 1514) Temp Source: Oral (05/21 1514) BP: 137/65 (05/21 1514) Pulse Rate: 70 (05/21 1514)  Labs: Recent Labs    11/03/20 0523 11/04/20 0425 11/04/20 1536 11/05/20 0527  HGB 10.3* 9.9*  --  10.0*  HCT 34.8* 33.6*  --  33.9*  PLT 333 281  --  304  CREATININE 0.64 0.50 0.53 0.41*    Estimated Creatinine Clearance: 46 mL/min (A) (by C-G formula based on SCr of 0.41 mg/dL (L)).   Medical History: Past Medical History:  Diagnosis Date  . Anxiety   . AV bloc first degree   . Carotid stenosis 03/02/2017  . Hyperlipidemia   . Hypertension   . Lumbar pain    fell off of her horse and has low back pain at times  . Venous insufficiency of both lower extremities   . Vertigo     Medications:  -Apixaban 5 mg PO BID PTA for atrial fibrillation  Assessment: Pt is an 47 yoF admitted with anemia, GIB. Found to have ascending colonic mass, s/p hemicolectomy on 10/31/20. Pt was taking apixaban PTA For atrial fibrillation, last dose 5/11 @ 0800.   Pharmacy consulted by [facility] to resume apixaban today (cleared by [facility]), but specified reduced dose of 2.5 mg PO BID given the fact that patient is post-op and TBW 65 kg is close to cutoff warranting dose reduction.   Today, 11/05/20  CBC: Hgb (10) low but stable; Plt WNL  Enoxaparin 40 mg subQ daily for DVT ppx. LD  5/21 @ 1006  Age: 86, TBW 65 kg, SCr <0.8  Plan:   Discontinue enoxaparin  Apixaban 2.5 mg PO BID starting tonight (reduced dose per MD instructions)  Monitor CBC  Monitor for signs of bleeding  Lenis Noon, PharmD 11/05/2020,3:58 PM

## 2020-11-06 DIAGNOSIS — C180 Malignant neoplasm of cecum: Secondary | ICD-10-CM | POA: Diagnosis not present

## 2020-11-06 LAB — COMPREHENSIVE METABOLIC PANEL
ALT: 23 U/L (ref 0–44)
AST: 15 U/L (ref 15–41)
Albumin: 2.7 g/dL — ABNORMAL LOW (ref 3.5–5.0)
Alkaline Phosphatase: 114 U/L (ref 38–126)
Anion gap: 6 (ref 5–15)
BUN: 16 mg/dL (ref 8–23)
CO2: 26 mmol/L (ref 22–32)
Calcium: 8.9 mg/dL (ref 8.9–10.3)
Chloride: 105 mmol/L (ref 98–111)
Creatinine, Ser: 0.44 mg/dL (ref 0.44–1.00)
GFR, Estimated: >60 mL/min (ref >60)
Glucose, Bld: 154 mg/dL — ABNORMAL HIGH (ref 70–99)
Potassium: 3.4 mmol/L — ABNORMAL LOW (ref 3.5–5.1)
Sodium: 137 mmol/L (ref 135–145)
Total Bilirubin: 1.7 mg/dL — ABNORMAL HIGH (ref 0.3–1.2)
Total Protein: 5.4 g/dL — ABNORMAL LOW (ref 6.5–8.1)

## 2020-11-06 LAB — CBC
HCT: 34 % — ABNORMAL LOW (ref 36.0–46.0)
Hemoglobin: 10.1 g/dL — ABNORMAL LOW (ref 12.0–15.0)
MCH: 23.8 pg — ABNORMAL LOW (ref 26.0–34.0)
MCHC: 29.7 g/dL — ABNORMAL LOW (ref 30.0–36.0)
MCV: 80 fL (ref 80.0–100.0)
Platelets: 300 10*3/uL (ref 150–400)
RBC: 4.25 MIL/uL (ref 3.87–5.11)
RDW: 24.2 % — ABNORMAL HIGH (ref 11.5–15.5)
WBC: 17.9 10*3/uL — ABNORMAL HIGH (ref 4.0–10.5)
nRBC: 0 % (ref 0.0–0.2)

## 2020-11-06 LAB — MAGNESIUM: Magnesium: 1.8 mg/dL (ref 1.7–2.4)

## 2020-11-06 LAB — PHOSPHORUS: Phosphorus: 2.3 mg/dL — ABNORMAL LOW (ref 2.5–4.6)

## 2020-11-06 MED ORDER — POTASSIUM CHLORIDE CRYS ER 20 MEQ PO TBCR
40.0000 meq | EXTENDED_RELEASE_TABLET | Freq: Once | ORAL | Status: AC
  Administered 2020-11-06: 40 meq via ORAL
  Filled 2020-11-06: qty 2

## 2020-11-06 MED ORDER — KCL IN DEXTROSE-NACL 20-5-0.45 MEQ/L-%-% IV SOLN
INTRAVENOUS | Status: DC
  Filled 2020-11-06 (×2): qty 1000

## 2020-11-06 NOTE — Plan of Care (Signed)

## 2020-11-06 NOTE — Progress Notes (Signed)
PROGRESS NOTE    Robin [name]  GHW:[id number] DOB: December 23, 1934 DOA: 10/26/2020 PCP: Robin Stalker, PA-C   No chief complaint on file.  Brief Narrative:  86 year old F with PMH of Robin [name]. fib on Eliquis and HTN presented to ED for anemia and shortness of breath, and admitted for blood loss anemia due to GI bleed.  Hgb 8.5 (reportedly 11 in January 2022 per daughter).  Hemoccult positive.  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  Colonoscopy on 5/13 confirmed malignant tumor adjacent to the IC valve. Pathology from proximal mass with invasive adenocarcinoma.  Pathology of polyp from transverse colon and splenic flexure with at least high-grade dysplasia. MRI abdomen with and without contrast without significant finding within the visualized portion of the abdomen. Patient underwent laparoscopic proximal hemicolectomy with reanastomosis on 5/16.  Post op treatment now per surgery.  Assessment & Plan:   Principal Problem:   Cecal cancer & polyps s/p lap hemicolectomy 10/31/2020 Active Problems:   Paroxysmal atrial fibrillation (HCC)   Chronic anticoagulation   Anemia   GI bleed   Colonic mass   High grade dysplasia in colonic adenomas transverse colon & splenic flexure   Colon cancer (HCC)   Hypophosphatemia  Adenocarcinoma of proximal colon with ileocecal adenopathy: Noted on CT abdomen and pelvis and colonoscopy. MRI as above.  Pathology from proximal mass with invasive adenocarcinoma.  Pathology of polyp from transverse colon and splenic flexure with at least high-grade dysplasia.  -S/p laparoscopic proximal hemicolectomy with reanastomosis by Dr. Johney [name] on 5/16 -General surgery managing -Follow surgical pathology -> invasive moderately differentiated adenocarcinoma with mucinous features, tumor invades muscularis propia, margins of resction not involved, 21 LN's negative for carcinoma.  - Dr. Johney [name]  planning for patient to follow up with tumor board - Will need outpatient oncology follow up  Nausea  Vomiting  Ileus  - plain film with dilated small bowel loobs concerning for distal small bowel obstruction or ileus - NGT d/c'd 5/21, continue clear liquid diet per surgery, follow - per surgery, appreciate assistance  Severe iron deficiency anemia due to acute on chronic blood loss from GI bleed: FOBT positive.  Hgb 13.7 in 2019, and reportedly about 12 in January 2022 per daughter. - Hb relatively stable today, follow - labs c/w iron def anemia, low normal B12 (follow MMA)  Paroxysmal Robin [name]. Fib with RVR: On Toprol-XL, Cardizem and Eliquis at home.  CHA2DS2-VASc score 4 (age, sex, HTN) -rate well controlled today -continue home dilt and metop -Resume Eliquis once okay from surgical perspective -> discussed with surgery 5/20, noted ok to restart 5/21, will start lower dose given post op and borderline weight  Essential hypertension: Normotensive. -metop and diltiazide  -Continue holding Maxzide for now  Hypokalemia/hypophosphatemia: Hypokalemia could be due to HCTZ.  Resolved. - replace and follow  Leukocytosis - up today (previously was improving) - follow   DVT prophylaxis: lovenox Code Status: full Family Communication: daughter over phone in room - 5/22 Disposition:   Status is: Inpatient  Remains inpatient appropriate because:Inpatient level of care appropriate due to severity of illness   Dispo: The patient is from: Home              Anticipated d/c is to: pending              Patient currently is not medically stable to d/c.   Difficult to place patient No       Consultants:  Surgery  GI  Procedures:  5/16 PROCEDURE:   LAPAROSCOPIC PROXIMAL HEMICOLECTOMY  OMENTOPEXY OF ANASTOMOSIS LAPAROSCOPIC LYSIS OF ADHESIONS TRANSVERSUS ABDOMINIS PLANE (TAP) BLOCK - BILATERAL  Colonoscopy - Obvious malignant tumor adjacent to the IC valve. Biopsied. - Several  polyps were also removed (mixed snare cautery and cold snare). The largest was located at the presumed splenic flexure, removed piecemeal, and although it was soft and not overtly malignant I labled the site with submucosal Spot injections given it's size (2.5cm). - Return patient to hospital ward for ongoing care. - Will need staging tests with CT scan chest, abd, pelvis. CEA. - I recommend continuing to hold eliquis for now. Await pathology results. - General surgery has already been consulted. - [facility] will return to see her on Monday. Drs. Robin [name] and Robin [name] are covering this weekend if needed.  Antimicrobials: Anti-infectives (From admission, onward)   Start     Dose/Rate Route Frequency Ordered Stop   10/31/20 2200  cefoTEtan (CEFOTAN) 2 g in sodium chloride 0.9 % 100 mL IVPB        2 g 200 mL/hr over 30 Minutes Intravenous Every 12 hours 10/31/20 1744 10/31/20 2205   10/31/20 0915  cefoTEtan (CEFOTAN) 2 g in sodium chloride 0.9 % 100 mL IVPB        2 g 200 mL/hr over 30 Minutes Intravenous On call to O.R. 10/31/20 0824 10/31/20 1312        Subjective: Still some nausea with clears at times, no emesis -frequent BM's  Objective: Vitals:   11/06/20 0610 11/06/20 0655 11/06/20 0914 11/06/20 1422  BP: (!) 149/89  (!) 159/71 (!) 149/74  Pulse: 74  89 79  Resp: (!) 24   20  Temp: 98.1 F (36.7 C)   98.7 F (37.1 C)  TempSrc: Oral   Oral  SpO2: 98%   94%  Weight:  67 kg    Height:        Intake/Output Summary (Last 24 hours) at 11/06/2020 1635 Last data filed at 11/06/2020 1400 Gross per 24 hour  Intake 1582.36 ml  Output --  Net 1582.36 ml   Filed Weights   11/04/20 0540 11/05/20 0441 11/06/20 0655  Weight: 64.8 kg 65.4 kg 67 kg    Examination:  General: No acute distress. Cardiovascular: irregularly irregular  Lungs: Clear to auscultation bilaterally Abdomen: Soft, mildly distended  Neurological: Alert and oriented 3. Moves all extremities 4 . Cranial  nerves II through XII grossly intact. Skin: Warm and dry. No rashes or lesions. Extremities: No clubbing or cyanosis. No edema.   Data Reviewed: I have personally reviewed following labs and imaging studies  CBC: Recent Labs  Lab 11/02/20 0508 11/03/20 0523 11/04/20 0425 11/05/20 0527 11/06/20 0521  WBC 23.2* 17.7* 11.6* 10.9* 17.9*  NEUTROABS  --  15.8* 10.1* 9.2*  --   HGB 10.5* 10.3* 9.9* 10.0* 10.1*  HCT 34.7* 34.8* 33.6* 33.9* 34.0*  MCV 76.4* 78.0* 79.8* 79.4* 80.0  PLT 369 333 281 304 409    Basic Metabolic Panel: Recent Labs  Lab 11/02/20 0508 11/03/20 0523 11/04/20 0425 11/04/20 1536 11/05/20 0527 11/06/20 0521  NA 131* 134* 139 135 135 137  K 3.7 3.5 2.6* 3.2* 3.3* 3.4*  CL 96* 100 104 102 102 105  CO2 27 26 28 26 26 26   GLUCOSE 137* 114* 106* 121* 156* 154*  BUN 17 13 11 11 11 16   CREATININE 0.70 0.64 0.50 0.53 0.41* 0.44  CALCIUM 9.8 9.5 8.7* 8.9  8.8* 8.9  MG 2.2 2.2 2.0  --  1.9 1.8  PHOS 1.8* 1.6* 1.7*  --  1.4* 2.3*    GFR: Estimated Creatinine Clearance: 50.1 mL/min (by C-G formula based on SCr of 0.44 mg/dL).  Liver Function Tests: Recent Labs  Lab 11/02/20 0508 11/03/20 0523 11/04/20 0425 11/05/20 0527 11/06/20 0521  AST  --  16 17 15 15   ALT  --  19 19 20 23   ALKPHOS  --  79 84 95 114  BILITOT  --  1.9* 1.9* 1.5* 1.7*  PROT  --  5.9* 5.6* 5.4* 5.4*  ALBUMIN 3.4* 3.3* 2.9* 2.7* 2.7*    CBG: No results for input(s): GLUCAP in the last 168 hours.   Recent Results (from the past 240 hour(s))  Surgical pcr screen     Status: None   Collection Time: 10/31/20 10:19 AM   Specimen: Nasal Mucosa; Nasal Swab  Result Value Ref Range Status   MRSA, PCR NEGATIVE NEGATIVE Final   Staphylococcus aureus NEGATIVE NEGATIVE Final    Comment: (NOTE) The Xpert SA Assay (FDA approved for NASAL specimens in patients 78 years of age and older), is one component of [facility]run comprehensive surveillance program. It is not intended to diagnose infection nor  to guide or monitor treatment. Performed at [facility], [place] [address], [place], [place] [address]          Radiology Studies: No results found.      Scheduled Meds: . apixaban  2.5 mg Oral BID  . diltiazem  180 mg Oral Daily  . metoprolol tartrate  50 mg Oral BID   Continuous Infusions: . dextrose 5 % and 0.45 % NaCl with KCl 20 mEq/L 50 mL/hr at 11/06/20 0924  . methocarbamol (ROBAXIN) IV       LOS: 10 days    Time spent: over 30 min    Fayrene Helper, MD [facility]   To contact the attending provider between 7A-7P or the covering provider during after hours 7P-7A, please log into the web site [email or web address] and access using universal [place] password for that web site. If you do not have the password, please call the hospital operator.  11/06/2020, 4:35 PM

## 2020-11-06 NOTE — Progress Notes (Addendum)
     Assessment & Plan: POD#6 - s/p lap assisted right hemicolectomy for ascending colon mass (adenocarcinoma) and multiple polyps (likely pre-malignant), Dr. Johney Maine 10/31/20  ileus seems to be improving, multiple loose BM's overnight  continue clear liquid diet today  continue to mobilization/pulm toilet/IS Leukocytosis  WBC 17.9 this AM  Afebrile, VSS  Will monitor for now  Atrial fibrillation HTN HLD  FEN -clear liquid diet VTE -Lovenox  ID -Cefotetanpre-op        [name], MD       [facility]       Office: [phone]   Chief Complaint: Colon cancer  Subjective: Patient in bed, slept well.  Several liquid BM's overnight.  Denies pain.  Mild nausea, no emesis.  Objective: Vital signs in last 24 hours: Temp:  [97.5 F (36.4 C)-99.8 F (37.7 C)] 98.1 F (36.7 C) (05/22 0610) Pulse Rate:  [70-97] 74 (05/22 0610) Resp:  [16-29] 24 (05/22 0610) BP: (137-149)/(65-89) 149/89 (05/22 0610) SpO2:  [95 %-98 %] 98 % (05/22 0610) Weight:  [67 kg] 67 kg (05/22 0655) Last BM Date: 11/05/20  Intake/Output from previous day: 05/21 0701 - 05/22 0700 In: 1199.3 [P.O.:480; I.V.:719.3] Out: -  Intake/Output this shift: No intake/output data recorded.  Physical Exam: HEENT - sclerae clear, mucous membranes moist Neck - soft Abdomen - softer, protuberant; BS present Ext - no edema, non-tender Neuro - alert & oriented, no focal deficits  Lab Results:  Recent Labs    11/05/20 0527 11/06/20 0521  WBC 10.9* 17.9*  HGB 10.0* 10.1*  HCT 33.9* 34.0*  PLT 304 300   BMET Recent Labs    11/05/20 0527 11/06/20 0521  NA 135 137  K 3.3* 3.4*  CL 102 105  CO2 26 26  GLUCOSE 156* 154*  BUN 11 16  CREATININE 0.41* 0.44  CALCIUM 8.8* 8.9   PT/INR No results for input(s): LABPROT, INR in the last 72 hours. Comprehensive Metabolic Panel:    Component Value Date/Time   NA 137 11/06/2020 0521   NA 135 11/05/2020 0527   NA 139 02/21/2018  0854   NA 137 02/27/2017 1205   K 3.4 (L) 11/06/2020 0521   K 3.3 (L) 11/05/2020 0527   CL 105 11/06/2020 0521   CL 102 11/05/2020 0527   CO2 26 11/06/2020 0521   CO2 26 11/05/2020 0527   BUN 16 11/06/2020 0521   BUN 11 11/05/2020 0527   BUN 20 02/21/2018 0854   BUN 12 02/27/2017 1205   CREATININE 0.44 11/06/2020 0521   CREATININE 0.41 (L) 11/05/2020 0527   CREATININE 0.54 (L) 11/28/2015 1422   CREATININE 0.71 08/19/2015 0905   GLUCOSE 154 (H) 11/06/2020 0521   GLUCOSE 156 (H) 11/05/2020 0527   CALCIUM 8.9 11/06/2020 0521   CALCIUM 8.8 (L) 11/05/2020 0527   AST 15 11/06/2020 0521   AST 15 11/05/2020 0527   ALT 23 11/06/2020 0521   ALT 20 11/05/2020 0527   ALKPHOS 114 11/06/2020 0521   ALKPHOS 95 11/05/2020 0527   BILITOT 1.7 (H) 11/06/2020 0521   BILITOT 1.5 (H) 11/05/2020 0527   PROT 5.4 (L) 11/06/2020 0521   PROT 5.4 (L) 11/05/2020 0527   ALBUMIN 2.7 (L) 11/06/2020 0521   ALBUMIN 2.7 (L) 11/05/2020 0527    Studies/Results: No results found.    [name] 11/06/2020  Patient ID: Mckinley Jewel, female   DOB: February 04, 1935, 86 y.o.   MRN: [id number]

## 2020-11-07 DIAGNOSIS — C180 Malignant neoplasm of cecum: Secondary | ICD-10-CM | POA: Diagnosis not present

## 2020-11-07 LAB — COMPREHENSIVE METABOLIC PANEL
ALT: 30 U/L (ref 0–44)
AST: 24 U/L (ref 15–41)
Albumin: 2.5 g/dL — ABNORMAL LOW (ref 3.5–5.0)
Alkaline Phosphatase: 105 U/L (ref 38–126)
Anion gap: 5 (ref 5–15)
BUN: 18 mg/dL (ref 8–23)
CO2: 28 mmol/L (ref 22–32)
Calcium: 8.7 mg/dL — ABNORMAL LOW (ref 8.9–10.3)
Chloride: 104 mmol/L (ref 98–111)
Creatinine, Ser: 0.52 mg/dL (ref 0.44–1.00)
GFR, Estimated: >60 mL/min (ref >60)
Glucose, Bld: 175 mg/dL — ABNORMAL HIGH (ref 70–99)
Potassium: 3.6 mmol/L (ref 3.5–5.1)
Sodium: 137 mmol/L (ref 135–145)
Total Bilirubin: 1.3 mg/dL — ABNORMAL HIGH (ref 0.3–1.2)
Total Protein: 5.2 g/dL — ABNORMAL LOW (ref 6.5–8.1)

## 2020-11-07 LAB — CBC
HCT: 35.2 % — ABNORMAL LOW (ref 36.0–46.0)
Hemoglobin: 10.4 g/dL — ABNORMAL LOW (ref 12.0–15.0)
MCH: 24 pg — ABNORMAL LOW (ref 26.0–34.0)
MCHC: 29.5 g/dL — ABNORMAL LOW (ref 30.0–36.0)
MCV: 81.1 fL (ref 80.0–100.0)
Platelets: 286 10*3/uL (ref 150–400)
RBC: 4.34 MIL/uL (ref 3.87–5.11)
RDW: 24.8 % — ABNORMAL HIGH (ref 11.5–15.5)
WBC: 16.4 10*3/uL — ABNORMAL HIGH (ref 4.0–10.5)
nRBC: 0 % (ref 0.0–0.2)

## 2020-11-07 LAB — MAGNESIUM: Magnesium: 1.7 mg/dL (ref 1.7–2.4)

## 2020-11-07 LAB — HEMOGLOBIN A1C
Hgb A1c MFr Bld: 5.2 % (ref 4.8–5.6)
Mean Plasma Glucose: 102.54 mg/dL

## 2020-11-07 LAB — SURGICAL PATHOLOGY

## 2020-11-07 LAB — PHOSPHORUS: Phosphorus: 2.1 mg/dL — ABNORMAL LOW (ref 2.5–4.6)

## 2020-11-07 MED ORDER — VANCOMYCIN 50 MG/ML ORAL SOLUTION
125.0000 mg | Freq: Four times a day (QID) | ORAL | Status: DC
  Administered 2020-11-08 (×3): 125 mg via ORAL
  Filled 2020-11-07 (×5): qty 2.5

## 2020-11-07 NOTE — Progress Notes (Signed)
Occupational Therapy Treatment Patient Details Name: SENNIE BORDEN MRN: [id number] DOB: [birth year] Today's Date: 11/07/2020    History of present illness 86 year old F presented to ED for anemia and shortness of breath, and admitted for blood loss anemia due to GI bleed.  Hgb 8.5 (reportedly 11 in January 2022 per daughter).  Hemoccult positive.  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 s/p laparoscopic hemicolectomy 10/31/20. Pt  with PMH of A. fib on Eliquis and HTN.   OT comments  Pt continues to make progress toward goals of modified independent with adls. Pt completing most adls with min guard to supervision on this date. Pt fatigued after session and requesting to rest in bed as opposed to the chair.     Follow Up Recommendations  Home health OT;Supervision - Intermittent    Equipment Recommendations  None recommended by OT    Recommendations for Other Services      Precautions / Restrictions Restrictions Weight Bearing Restrictions: No       Mobility Bed Mobility Overal bed mobility: Needs Assistance Bed Mobility: Sit to Supine       Sit to supine: Supervision   General bed mobility comments: No physical assist needed. Bed at 30 degrees.    Transfers Overall transfer level: Needs assistance Equipment used: 1 person hand held assist Transfers: Sit to/from Stand Sit to Stand: Supervision         General transfer comment: Only time assist needed was when lines present.    Balance Overall balance assessment: Mild deficits observed, not formally tested                                         ADL either performed or assessed with clinical judgement   ADL Overall ADL's : Needs assistance/impaired Eating/Feeding: Independent;Sitting   Grooming: Oral care;Wash/dry hands;Wash/dry face;Supervision/safety;Standing Grooming Details (indicate cue type and  reason): Pt stood at sink to complete grooming. Pt fatigued from morning walk.                 Toilet Transfer: Min guard;Ambulation;RW;Comfort height toilet;Grab bars Toilet Transfer Details (indicate cue type and reason): Pt not needing physical assist go get to bathroom and on/off commode but did state she feels tired and in need of a nap. Toileting- Clothing Manipulation and Hygiene: Supervision/safety;Sit to/from stand       Functional mobility during ADLs: Supervision/safety General ADL Comments: Pt making good progress toward being more independent with adls. Pt continues to feel weak and lack endurance.     Vision   Vision Assessment?: No apparent visual deficits   Perception     Praxis      Cognition Arousal/Alertness: Awake/alert Behavior During Therapy: WFL for tasks assessed/performed Overall Cognitive Status: Within Functional Limits for tasks assessed                                          Exercises     Shoulder Instructions       General Comments Pt small pressure area on sacrum.  Barrier cream applied.    Pertinent Vitals/ Pain       Pain Assessment: 0-10 Pain Score: 2  Pain Location: abdomen with mobility Pain Descriptors / Indicators: Sore;Tender Pain Intervention(s): Repositioned;Monitored during session  Home Living                                          Prior Functioning/Environment              Frequency  Min 2X/week        Progress Toward Goals  OT Goals(current goals can now be found in the care plan section)  Progress towards OT goals: Progressing toward goals  Acute Rehab OT Goals Patient Stated Goal: be able to eat OT Goal Formulation: With patient Time For Goal Achievement: 11/15/20 Potential to Achieve Goals: Good ADL Goals Pt Will Perform Lower Body Dressing: with modified independence;sit to/from stand;sitting/lateral leans Pt Will Transfer to Toilet: with modified  independence;ambulating Pt Will Perform Toileting - Clothing Manipulation and hygiene: with modified independence;sitting/lateral leans;sit to/from stand Additional ADL Goal #1: Patient will tolerate 10 minutes of standing activity at mod I level in order to safely participate in daily routine.  Plan Discharge plan remains appropriate    Co-evaluation                 AM-PAC OT "6 Clicks" Daily Activity     Outcome Measure   Help from another person eating meals?: None Help from another person taking care of personal grooming?: None Help from another person toileting, which includes using toliet, bedpan, or urinal?: A Little Help from another person bathing (including washing, rinsing, drying)?: A Little Help from another person to put on and taking off regular upper body clothing?: None Help from another person to put on and taking off regular lower body clothing?: A Little 6 Click Score: 21    End of Session Equipment Utilized During Treatment: Rolling walker  OT Visit Diagnosis: Pain;Other abnormalities of gait and mobility (R26.89);Unsteadiness on feet (R26.81) Pain - part of body:  (abdomen)   Activity Tolerance Patient tolerated treatment well   Patient Left in bed;with call bell/phone within reach;with bed alarm set   Nurse Communication Mobility status        Time: 9702-6378 OT Time Calculation (min): 17 min  Charges: OT General Charges $OT Visit: 1 Visit OT Treatments $Self Care/Home Management : 8-22 mins   Glenford Peers 11/07/2020, 12:18 PM

## 2020-11-07 NOTE — Plan of Care (Signed)
  Problem: Activity: Goal: Risk for activity intolerance will decrease Outcome: Progressing   Problem: Coping: Goal: Level of anxiety will decrease Outcome: Progressing   

## 2020-11-07 NOTE — Progress Notes (Signed)
Physical Therapy Treatment Patient Details Name: Robin [name] MRN: [id number] DOB: May 20, 1935 Today's Date: 11/07/2020    History of Present Illness 86 year old F presented to ED for anemia and shortness of breath, and admitted for blood loss anemia due to GI bleed.  Hgb 8.5 (reportedly 11 in January 2022 per daughter).  Hemoccult positive.  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 s/p laparoscopic hemicolectomy 10/31/20. Pt  with PMH of A. fib on Eliquis and HTN.    PT Comments    Pt assisted to [facility] per request and then ambulated in hallway.  Pt reports increased fatigue today.   Follow Up Recommendations  No PT follow up     Equipment Recommendations  None recommended by PT    Recommendations for Other Services       Precautions / Restrictions Precautions Precaution Comments: abdominal surgery Restrictions Weight Bearing Restrictions: No    Mobility  Bed Mobility Overal bed mobility: Needs Assistance Bed Mobility: Sit to Supine       Sit to supine: Supervision   General bed mobility comments: No physical assist needed. Bed at 30 degrees.    Transfers Overall transfer level: Needs assistance Equipment used: Rolling walker (2 wheeled) Transfers: Sit to/from Stand Sit to Stand: Supervision         General transfer comment: close supervision for safety, cues for hand placement  Ambulation/Gait Ambulation/Gait assistance: Min guard Gait Distance (Feet): 200 Feet Assistive device: Rolling walker (2 wheeled) Gait Pattern/deviations: Step-through pattern;Decreased stride length Gait velocity: decreased   General Gait Details: cues for posture, slow but steady with RW, reports fatigue   Stairs             Wheelchair Mobility    Modified Rankin (Stroke Patients Only)       Balance Overall balance assessment: Mild deficits observed, not formally tested                                           Cognition Arousal/Alertness: Awake/alert Behavior During Therapy: WFL for tasks assessed/performed Overall Cognitive Status: Within Functional Limits for tasks assessed                                        Exercises      General Comments General comments (skin integrity, edema, etc.): Pt small pressure area on sacrum.  Barrier cream applied.      Pertinent Vitals/Pain Pain Assessment: Faces Pain Score: 2  Faces Pain Scale: Hurts a little bit Pain Location: abdomen with mobility Pain Descriptors / Indicators: Sore;Tender Pain Intervention(s): Monitored during session;Repositioned    Home Living                      Prior Function            PT Goals (current goals can now be found in the care plan section) Acute Rehab PT Goals Patient Stated Goal: be able to eat Progress towards PT goals: Progressing toward goals    Frequency    Min 3X/week      PT Plan Current plan remains appropriate    Co-evaluation              AM-PAC PT "6 Clicks" Mobility  Outcome Measure  Help needed turning from your back to your side while in a flat bed without using bedrails?: A Little Help needed moving from lying on your back to sitting on the side of a flat bed without using bedrails?: A Little Help needed moving to and from a bed to a chair (including a wheelchair)?: A Little Help needed standing up from a chair using your arms (e.g., wheelchair or bedside chair)?: A Little Help needed to walk in hospital room?: A Little Help needed climbing 3-5 steps with a railing? : A Little 6 Click Score: 18    End of Session Equipment Utilized During Treatment: Gait belt Activity Tolerance: Patient tolerated treatment well Patient left: with call bell/phone within reach;in bed;with bed alarm set Nurse Communication: Mobility status PT Visit Diagnosis: Difficulty in walking, not elsewhere classified  (R26.2)     Time: 0347-4259 PT Time Calculation (min) (ACUTE ONLY): 20 min  Charges:  $Gait Training: 8-22 mins                     Arlyce Dice, DPT Acute Rehabilitation Services Pager: [phone] Office: [phone]   York Ram E 11/07/2020, 3:56 PM

## 2020-11-07 NOTE — Progress Notes (Signed)
Notified by RN that pt having very loose liquid yellow foul smelling diarrhea.  Robin [name] initially was admitted with GI bleeding and found to have colon cancer.  Is undergone a colectomy.  Did have antibiotics around time of surgery.  Pt with potential C.Diff infection.   I have paged the C.Diff MD to discuss case and authorize testing but no response after multiple pages over past hour.  Will start treatment with po vancomycin and will have daytime attending obtain sample to test in am. Sample would still be positive in am and feel it is more prudent to start treatment now and not delay treating awaiting authorization to test for C.Diff and its results.

## 2020-11-07 NOTE — Progress Notes (Signed)
Pt alert and aware sitting up on bed. She said that she is feeling better today. She said that she was involved with a choir for thirty years. The chaplain offered caring and supportive presence, he sang a hymn to her, and offered prayers and blessings.

## 2020-11-07 NOTE — Progress Notes (Addendum)
PROGRESS NOTE    Robin [name]  FTD:[id number] DOB: 1934/08/20 DOA: 10/26/2020 PCP: Robin Stalker, PA-C   No chief complaint on file.  Brief Narrative:  86 year old F with PMH of Robin [name]. fib on Eliquis and HTN presented to ED for anemia and shortness of breath, and admitted for blood loss anemia due to [name] bleed.  Hgb 8.5 (reportedly 11 in January 2022 per daughter).  Hemoccult positive.  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  Colonoscopy on 5/13 confirmed malignant tumor adjacent to the IC valve. Pathology from proximal mass with invasive adenocarcinoma.  Pathology of polyp from transverse colon and splenic flexure with at least high-grade dysplasia. MRI abdomen with and without contrast without significant finding within the visualized portion of the abdomen. Patient underwent laparoscopic proximal hemicolectomy with reanastomosis on 5/16.  Post op treatment now per surgery.  Assessment & Plan:   Principal Problem:   Cecal cancer & polyps s/p lap hemicolectomy 10/31/2020 Active Problems:   Paroxysmal atrial fibrillation (HCC)   Chronic anticoagulation   Anemia   [name] bleed   Colonic mass   High grade dysplasia in colonic adenomas transverse colon & splenic flexure   Colon cancer (HCC)   Hypophosphatemia  Adenocarcinoma of proximal colon with ileocecal adenopathy: Noted on CT abdomen and pelvis and colonoscopy. MRI as above.  Pathology from proximal mass with invasive adenocarcinoma.  Pathology of polyp from transverse colon and splenic flexure with at least high-grade dysplasia.  -S/p laparoscopic proximal hemicolectomy with reanastomosis by Dr. Johney [name] on 5/16 -General surgery managing -Follow surgical pathology -> invasive moderately differentiated adenocarcinoma with mucinous features, tumor invades muscularis propia, margins of resction not involved, 21 LN's negative for carcinoma.  - Dr. Johney [name]  planning for patient to follow up with tumor board - Will need outpatient oncology follow up  Nausea  Vomiting  Ileus  - plain film with dilated small bowel loobs concerning for distal small bowel obstruction or ileus - NGT d/c'd 5/21, advance to full liquid diet per surgery, follow - per surgery, appreciate assistance  Severe iron deficiency anemia due to acute on chronic blood loss from [name] bleed: FOBT positive.  Hgb 13.7 in 2019, and reportedly about 12 in January 2022 per daughter. - Hb relatively stable today, follow - labs c/w iron def anemia, low normal B12 (follow MMA)  Paroxysmal Robin [name]. Fib with RVR: On Toprol-XL, Cardizem and Eliquis at home.  CHA2DS2-VASc score 4 (age, sex, HTN) -rate well controlled today -continue home dilt and metop -Resume Eliquis once okay from surgical perspective -> discussed with surgery 5/20, noted ok to restart 5/21, will start lower dose given post op and borderline weight  Essential hypertension: Normotensive. -metop and diltiazide  -Continue holding Maxzide for now  Hypokalemia/hypophosphatemia: Hypokalemia could be due to HCTZ.  Resolved. - replace and follow and follow  Hyperglycemia - follow A1c  Leukocytosis - fluctuating, ctm  DVT prophylaxis: lovenox Code Status: full Family Communication: daughter over phone in room - 5/23 Disposition:   Status is: Inpatient  Remains inpatient appropriate because:Inpatient level of care appropriate due to severity of illness   Dispo: The patient is from: Home              Anticipated d/c is to: pending              Patient currently is not medically stable to d/c.   Difficult to place patient No       Consultants:  Surgery  [name]  Procedures:  5/16 PROCEDURE:   LAPAROSCOPIC PROXIMAL HEMICOLECTOMY  OMENTOPEXY OF ANASTOMOSIS LAPAROSCOPIC LYSIS OF ADHESIONS TRANSVERSUS ABDOMINIS PLANE (TAP) BLOCK - BILATERAL  Colonoscopy - Obvious malignant tumor adjacent to the IC valve.  Biopsied. - Several polyps were also removed (mixed snare cautery and cold snare). The largest was located at the presumed splenic flexure, removed piecemeal, and although it was soft and not overtly malignant I labled the site with submucosal Spot injections given it's size (2.5cm). - Return patient to hospital ward for ongoing care. - Will need staging tests with CT scan chest, abd, pelvis. CEA. - I recommend continuing to hold eliquis for now. Await pathology results. - General surgery has already been consulted. - Robin [name] will return to see her on Monday. Drs. Robin [name] and Robin [name] are covering this weekend if needed.  Antimicrobials: Anti-infectives (From admission, onward)   Start     Dose/Rate Route Frequency Ordered Stop   10/31/20 2200  cefoTEtan (CEFOTAN) 2 g in sodium chloride 0.9 % 100 mL IVPB        2 g 200 mL/hr over 30 Minutes Intravenous Every 12 hours 10/31/20 1744 10/31/20 2205   10/31/20 0915  cefoTEtan (CEFOTAN) 2 g in sodium chloride 0.9 % 100 mL IVPB        2 g 200 mL/hr over 30 Minutes Intravenous On call to O.R. 10/31/20 0824 10/31/20 1312        Subjective: No new complaints  Objective: Vitals:   11/06/20 0914 11/06/20 1422 11/06/20 2148 11/07/20 1300  BP: (!) 159/71 (!) 149/74 (!) 150/71 (!) 147/69  Pulse: 89 79 88 83  Resp:  20  (!) 23  Temp:  98.7 F (37.1 C)  98.4 F (36.9 C)  TempSrc:  Oral  Oral  SpO2:  94%  96%  Weight:      Height:        Intake/Output Summary (Last 24 hours) at 11/07/2020 1347 Last data filed at 11/07/2020 1259 Gross per 24 hour  Intake 1176.74 ml  Output --  Net 1176.74 ml   Filed Weights   11/04/20 0540 11/05/20 0441 11/06/20 0655  Weight: 64.8 kg 65.4 kg 67 kg    Examination:  General: No acute distress. Cardiovascular: RRR Lungs: unlabored Abdomen: Soft, nontender, nondistended - surgical incisions appear intact, healing well Neurological: Alert and oriented 3. Moves all extremities 4 . Cranial nerves II  through XII grossly intact. Skin: Warm and dry. No rashes or lesions. Extremities: No clubbing or cyanosis. No edema.   Data Reviewed: I have personally reviewed following labs and imaging studies  CBC: Recent Labs  Lab 11/03/20 0523 11/04/20 0425 11/05/20 0527 11/06/20 0521 11/07/20 0508  WBC 17.7* 11.6* 10.9* 17.9* 16.4*  NEUTROABS 15.8* 10.1* 9.2*  --   --   HGB 10.3* 9.9* 10.0* 10.1* 10.4*  HCT 34.8* 33.6* 33.9* 34.0* 35.2*  MCV 78.0* 79.8* 79.4* 80.0 81.1  PLT 333 281 304 300 616    Basic Metabolic Panel: Recent Labs  Lab 11/03/20 0523 11/04/20 0425 11/04/20 1536 11/05/20 0527 11/06/20 0521 11/07/20 0508  NA 134* 139 135 135 137 137  K 3.5 2.6* 3.2* 3.3* 3.4* 3.6  CL 100 104 102 102 105 104  CO2 26 28 26 26 26 28   GLUCOSE 114* 106* 121* 156* 154* 175*  BUN 13 11 11 11 16 18   CREATININE 0.64 0.50 0.53 0.41* 0.44 0.52  CALCIUM 9.5 8.7* 8.9 8.8* 8.9 8.7*  MG 2.2 2.0  --  1.9 1.8 1.7  PHOS 1.6* 1.7*  --  1.4* 2.3* 2.1*    GFR: Estimated Creatinine Clearance: 50.1 mL/min (by C-G formula based on SCr of 0.52 mg/dL).  Liver Function Tests: Recent Labs  Lab 11/03/20 0523 11/04/20 0425 11/05/20 0527 11/06/20 0521 11/07/20 0508  AST 16 17 15 15 24   ALT 19 19 20 23 30   ALKPHOS 79 84 95 114 105  BILITOT 1.9* 1.9* 1.5* 1.7* 1.3*  PROT 5.9* 5.6* 5.4* 5.4* 5.2*  ALBUMIN 3.3* 2.9* 2.7* 2.7* 2.5*    CBG: No results for input(s): GLUCAP in the last 168 hours.   Recent Results (from the past 240 hour(s))  Surgical pcr screen     Status: None   Collection Time: 10/31/20 10:19 AM   Specimen: Nasal Mucosa; Nasal Swab  Result Value Ref Range Status   MRSA, PCR NEGATIVE NEGATIVE Final   Staphylococcus aureus NEGATIVE NEGATIVE Final    Comment: (NOTE) The Xpert SA Assay (FDA approved for NASAL specimens in patients 46 years of age and older), is one component of [facility] comprehensive surveillance program. It is not intended to diagnose infection nor to guide or  monitor treatment. Performed at [facility], [place] [address], [place], [place] [address]          Radiology Studies: No results found.      Scheduled Meds: . apixaban  2.5 mg Oral BID  . diltiazem  180 mg Oral Daily  . metoprolol tartrate  50 mg Oral BID   Continuous Infusions: . dextrose 5 % and 0.45 % NaCl with KCl 20 mEq/L 50 mL/hr at 11/07/20 0600  . methocarbamol (ROBAXIN) IV       LOS: 11 days    Time spent: over 30 min    Fayrene Helper, MD [facility]   To contact the attending provider between 7A-7P or the covering provider during after hours 7P-7A, please log into the web site [email or web address] and access using universal [place] password for that web site. If you do not have the password, please call the hospital operator.  11/07/2020, 1:47 PM

## 2020-11-07 NOTE — Progress Notes (Signed)
Progress Note  7 Days Post-Op  Subjective: CC: feeling well. Had a small amount of abdominal pain earlier this am but feeling better now. She denies nausea or emesis on clears. Having numerous watery BMs. No respiratory complaints. Mobilizing   Objective: Vital signs in last 24 hours: Temp:  [98.7 F (37.1 C)] 98.7 F (37.1 C) (05/22 1422) Pulse Rate:  [79-88] 88 (05/22 2148) Resp:  [20] 20 (05/22 1422) BP: (149-150)/(71-74) 150/71 (05/22 2148) SpO2:  [94 %] 94 % (05/22 1422) Last BM Date: 11/06/20  Intake/Output from previous day: 05/22 0701 - 05/23 0700 In: 959.2 [I.V.:959.2] Out: -  Intake/Output this shift: No intake/output data recorded.  PE: General: pleasant, WD, female who is laying in bed in NAD HEENT: head is normocephalic, atraumatic. Mouth is pink and moist Heart: regular, rate, and rhythm. Palpable radial and pedal pulses bilaterally Lungs: CTAB, no wheezes, rhonchi, or rales noted.  Respiratory effort nonlabored Abd: soft, NT, +BS, mildly distended - incisions healing well MS: all 4 extremities are symmetrical with no cyanosis, clubbing, or edema. Skin: warm and dry with no masses, lesions, or rashes Psych: A&Ox3 with an appropriate affect.    Lab Results:  Recent Labs    11/06/20 0521 11/07/20 0508  WBC 17.9* 16.4*  HGB 10.1* 10.4*  HCT 34.0* 35.2*  PLT 300 286   BMET Recent Labs    11/06/20 0521 11/07/20 0508  NA 137 137  K 3.4* 3.6  CL 105 104  CO2 26 28  GLUCOSE 154* 175*  BUN 16 18  CREATININE 0.44 0.52  CALCIUM 8.9 8.7*   PT/INR No results for input(s): LABPROT, INR in the last 72 hours. CMP     Component Value Date/Time   NA 137 11/07/2020 0508   NA 139 02/21/2018 0854   K 3.6 11/07/2020 0508   CL 104 11/07/2020 0508   CO2 28 11/07/2020 0508   GLUCOSE 175 (H) 11/07/2020 0508   BUN 18 11/07/2020 0508   BUN 20 02/21/2018 0854   CREATININE 0.52 11/07/2020 0508   CREATININE 0.54 (L) 11/28/2015 1422   CALCIUM 8.7 (L)  11/07/2020 0508   PROT 5.2 (L) 11/07/2020 0508   ALBUMIN 2.5 (L) 11/07/2020 0508   AST 24 11/07/2020 0508   ALT 30 11/07/2020 0508   ALKPHOS 105 11/07/2020 0508   BILITOT 1.3 (H) 11/07/2020 0508   GFRNONAA >60 11/07/2020 0508   GFRAA 78 02/21/2018 0854   Lipase     Component Value Date/Time   LIPASE 31 10/26/2020 1919       Studies/Results: No results found.  Anti-infectives: Anti-infectives (From admission, onward)   Start     Dose/Rate Route Frequency Ordered Stop   10/31/20 2200  cefoTEtan (CEFOTAN) 2 g in sodium chloride 0.9 % 100 mL IVPB        2 g 200 mL/hr over 30 Minutes Intravenous Every 12 hours 10/31/20 1744 10/31/20 2205   10/31/20 0915  cefoTEtan (CEFOTAN) 2 g in sodium chloride 0.9 % 100 mL IVPB        2 g 200 mL/hr over 30 Minutes Intravenous On call to O.R. 10/31/20 0824 10/31/20 1312       Assessment/Plan  POD#7 -s/p lap assisted right hemicolectomy for ascending colon mass (adenocarcinoma) and multiple polyps (likely pre-malignant), Dr. Johney Maine 10/31/20 - ileus continues to improve - numerous loose stools - monitor - advance to fulls - continue to mobilization/pulm toilet/IS Leukocytosis - WBC 16.4 (17.9) this AM - Afebrile, VSS - Will monitor  for now  Atrial fibrillation HTN HLD  FEN -fulls VTE -Lovenox  ID -Cefotetanpre-op    LOS: 11 days    Winferd Humphrey, [facility] 11/07/2020, 11:10 AM Please see Amion for pager number during day hours 7:00am-4:30pm

## 2020-11-08 ENCOUNTER — Other Ambulatory Visit (HOSPITAL_COMMUNITY): Payer: Self-pay

## 2020-11-08 ENCOUNTER — Encounter: Payer: Self-pay | Admitting: Nurse Practitioner

## 2020-11-08 DIAGNOSIS — C180 Malignant neoplasm of cecum: Secondary | ICD-10-CM | POA: Diagnosis not present

## 2020-11-08 LAB — CBC WITH DIFFERENTIAL/PLATELET
Abs Immature Granulocytes: 0.16 10*3/uL — ABNORMAL HIGH (ref 0.00–0.07)
Basophils Absolute: 0 10*3/uL (ref 0.0–0.1)
Basophils Relative: 0 %
Eosinophils Absolute: 0.1 10*3/uL (ref 0.0–0.5)
Eosinophils Relative: 1 %
HCT: 38.9 % (ref 36.0–46.0)
Hemoglobin: 11.1 g/dL — ABNORMAL LOW (ref 12.0–15.0)
Immature Granulocytes: 1 %
Lymphocytes Relative: 8 %
Lymphs Abs: 1.1 10*3/uL (ref 0.7–4.0)
MCH: 23.6 pg — ABNORMAL LOW (ref 26.0–34.0)
MCHC: 28.5 g/dL — ABNORMAL LOW (ref 30.0–36.0)
MCV: 82.8 fL (ref 80.0–100.0)
Monocytes Absolute: 0.9 10*3/uL (ref 0.1–1.0)
Monocytes Relative: 6 %
Neutro Abs: 11.7 10*3/uL — ABNORMAL HIGH (ref 1.7–7.7)
Neutrophils Relative %: 84 %
Platelets: 370 10*3/uL (ref 150–400)
RBC: 4.7 MIL/uL (ref 3.87–5.11)
RDW: 25.2 % — ABNORMAL HIGH (ref 11.5–15.5)
WBC: 14 10*3/uL — ABNORMAL HIGH (ref 4.0–10.5)
nRBC: 0 % (ref 0.0–0.2)

## 2020-11-08 LAB — COMPREHENSIVE METABOLIC PANEL
ALT: 28 U/L (ref 0–44)
AST: 18 U/L (ref 15–41)
Albumin: 2.7 g/dL — ABNORMAL LOW (ref 3.5–5.0)
Alkaline Phosphatase: 120 U/L (ref 38–126)
Anion gap: 8 (ref 5–15)
BUN: 22 mg/dL (ref 8–23)
CO2: 24 mmol/L (ref 22–32)
Calcium: 8.9 mg/dL (ref 8.9–10.3)
Chloride: 103 mmol/L (ref 98–111)
Creatinine, Ser: 0.56 mg/dL (ref 0.44–1.00)
GFR, Estimated: >60 mL/min (ref >60)
Glucose, Bld: 98 mg/dL (ref 70–99)
Potassium: 3.1 mmol/L — ABNORMAL LOW (ref 3.5–5.1)
Sodium: 135 mmol/L (ref 135–145)
Total Bilirubin: 1.7 mg/dL — ABNORMAL HIGH (ref 0.3–1.2)
Total Protein: 5.6 g/dL — ABNORMAL LOW (ref 6.5–8.1)

## 2020-11-08 LAB — C DIFFICILE (CDIFF) QUICK SCRN (NO PCR REFLEX)
C Diff antigen: NEGATIVE
C Diff interpretation: NOT DETECTED
C Diff toxin: NEGATIVE

## 2020-11-08 LAB — MAGNESIUM: Magnesium: 1.9 mg/dL (ref 1.7–2.4)

## 2020-11-08 LAB — PHOSPHORUS: Phosphorus: 2.5 mg/dL (ref 2.5–4.6)

## 2020-11-08 MED ORDER — POTASSIUM CHLORIDE 10 MEQ/100ML IV SOLN
10.0000 meq | INTRAVENOUS | Status: AC
  Administered 2020-11-08 (×3): 10 meq via INTRAVENOUS
  Filled 2020-11-08 (×3): qty 100

## 2020-11-08 MED ORDER — ACETAMINOPHEN 325 MG PO TABS
650.0000 mg | ORAL_TABLET | Freq: Four times a day (QID) | ORAL | Status: DC | PRN
  Administered 2020-11-10: 650 mg via ORAL
  Filled 2020-11-08: qty 2

## 2020-11-08 MED ORDER — POTASSIUM CHLORIDE 20 MEQ PO PACK
40.0000 meq | PACK | Freq: Two times a day (BID) | ORAL | Status: AC
  Administered 2020-11-08 (×2): 40 meq via ORAL
  Filled 2020-11-08 (×2): qty 2

## 2020-11-08 MED ORDER — APIXABAN 5 MG PO TABS
5.0000 mg | ORAL_TABLET | Freq: Two times a day (BID) | ORAL | Status: DC
  Administered 2020-11-09 – 2020-11-10 (×3): 5 mg via ORAL
  Filled 2020-11-08 (×3): qty 1

## 2020-11-08 MED ORDER — TRAMADOL HCL 50 MG PO TABS
50.0000 mg | ORAL_TABLET | Freq: Four times a day (QID) | ORAL | Status: DC | PRN
Start: 2020-11-08 — End: 2020-11-10

## 2020-11-08 MED ORDER — BOOST / RESOURCE BREEZE PO LIQD CUSTOM
1.0000 | Freq: Three times a day (TID) | ORAL | Status: DC
  Administered 2020-11-08 – 2020-11-16 (×8): 1 via ORAL

## 2020-11-08 MED ORDER — ZINC OXIDE 40 % EX OINT
TOPICAL_OINTMENT | Freq: Two times a day (BID) | CUTANEOUS | Status: DC
  Administered 2020-11-08 – 2020-11-15 (×3): 1 via TOPICAL
  Administered 2020-11-17: 2 via TOPICAL
  Administered 2020-11-24 – 2020-11-28 (×3): 1 via TOPICAL
  Filled 2020-11-08 (×5): qty 57

## 2020-11-08 NOTE — Progress Notes (Signed)
Assumed care of patient at 1500.  I agree with the previous nurses assessment.

## 2020-11-08 NOTE — Progress Notes (Signed)
PHARMACY NOTE -  Eliquis  Pharmacy has been assisting with dosing of apixaban for AFib.  Currently at reduced dose of 2.5 mg PO bid d/t recent GIB and borderline weight  Pharmacy will sign off, following peripherally w/ MD for when to resume home dose and any other clinical status changes  Reuel Boom, PharmD, BCPS [phone] 11/08/2020, 9:58 AM

## 2020-11-08 NOTE — Progress Notes (Signed)
[facility] Progress Note  8 Days Post-Op  Subjective: CC-  Patient reports copious diarrhea over night. She was started on PO vancomycin and states that after the first 2 doses she has already noticed improvement. States that she has only had 2 loose stools this morning. Denies significant abdominal pain. Denies n/v this morning and appetite seems to be improving. Tolerating clear liquids.  Objective: Vital signs in last 24 hours: Temp:  [98.1 F (36.7 C)-98.4 F (36.9 C)] 98.1 F (36.7 C) (05/24 0612) Pulse Rate:  [83-100] 91 (05/24 0612) Resp:  [20-23] 23 (05/24 0612) BP: (134-147)/(69-74) 134/74 (05/24 0612) SpO2:  [96 %-98 %] 98 % (05/24 0612) Weight:  [65.2 kg-65.6 kg] 65.2 kg (05/24 0500) Last BM Date: 11/08/20  Intake/Output from previous day: 05/23 0701 - 05/24 0700 In: 991.6 [P.O.:600; I.V.:391.6] Out: -  Intake/Output this shift: No intake/output data recorded.  PE: Gen:  Alert, NAD, pleasant Pulm:  rate and effort normal Abd: soft, mild distension, nontender, +BS, incisions cdi without erythema or drainage  Lab Results:  Recent Labs    11/06/20 0521 11/07/20 0508  WBC 17.9* 16.4*  HGB 10.1* 10.4*  HCT 34.0* 35.2*  PLT 300 286   BMET Recent Labs    11/07/20 0508 11/08/20 0427  NA 137 135  K 3.6 3.1*  CL 104 103  CO2 28 24  GLUCOSE 175* 98  BUN 18 22  CREATININE 0.52 0.56  CALCIUM 8.7* 8.9   PT/INR No results for input(s): LABPROT, INR in the last 72 hours. CMP     Component Value Date/Time   NA 135 11/08/2020 0427   NA 139 02/21/2018 0854   K 3.1 (L) 11/08/2020 0427   CL 103 11/08/2020 0427   CO2 24 11/08/2020 0427   GLUCOSE 98 11/08/2020 0427   BUN 22 11/08/2020 0427   BUN 20 02/21/2018 0854   CREATININE 0.56 11/08/2020 0427   CREATININE 0.54 (L) 11/28/2015 1422   CALCIUM 8.9 11/08/2020 0427   PROT 5.6 (L) 11/08/2020 0427   ALBUMIN 2.7 (L) 11/08/2020 0427   AST 18 11/08/2020 0427   ALT 28 11/08/2020 0427   ALKPHOS  120 11/08/2020 0427   BILITOT 1.7 (H) 11/08/2020 0427   GFRNONAA >60 11/08/2020 0427   GFRAA 78 02/21/2018 0854   Lipase     Component Value Date/Time   LIPASE 31 10/26/2020 1919       Studies/Results: No results found.  Anti-infectives: Anti-infectives (From admission, onward)   Start     Dose/Rate Route Frequency Ordered Stop   11/08/20 0045  vancomycin (VANCOCIN) 50 mg/mL oral solution 125 mg        125 mg Oral Every 6 hours 11/07/20 2354     10/31/20 2200  cefoTEtan (CEFOTAN) 2 g in sodium chloride 0.9 % 100 mL IVPB        2 g 200 mL/hr over 30 Minutes Intravenous Every 12 hours 10/31/20 1744 10/31/20 2205   10/31/20 0915  cefoTEtan (CEFOTAN) 2 g in sodium chloride 0.9 % 100 mL IVPB        2 g 200 mL/hr over 30 Minutes Intravenous On call to O.R. 10/31/20 0824 10/31/20 1312       Assessment/Plan Atrial fibrillation - on toprol, cardizem, eliquis HTN HLD Diarrhea - possible c diff, started on oral vancomycin  --per [facility]--  POD#8-s/p lap assisted right hemicolectomy for ascending colon mass (adenocarcinoma) and multiple polyps (likely pre-malignant), Dr. Johney Maine 10/31/20 - surgical path: Invasive moderately differentiated adenocarcinoma  with mucinous  features, 4 cm; tumor invades the muscularis propria; margins of resection are not involved; twenty-one lymph nodes, negative for carcinoma (0/21).  - CBC pending. - Tolerating clears and having bowel function - Advance to full liquid diet and then Soft diet for dinner. Add Boost. Replete K. - continue therapies, mobilize  FEN -FLD>>soft diet VTE - eliquis ID -Cefotetanpre-op, PO vancomycin 5/24>>   LOS: 12 days    Wellington Hampshire, [facility] 11/08/2020, 9:43 AM Please see Amion for pager number during day hours 7:00am-4:30pm

## 2020-11-08 NOTE — Progress Notes (Signed)
PROGRESS NOTE    Robin [name]  EXH:[id number] DOB: [birth year] DOA: 10/26/2020 PCP: Robin Stalker, PA-C   No chief complaint on file.  Brief Narrative:  86 year old F with PMH of Robin [name]. fib on Eliquis and HTN presented to ED for anemia and shortness of breath, and admitted for blood loss anemia due to GI bleed.  Hgb 8.5 (reportedly 11 in January 2022 per daughter).  Hemoccult positive.  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  Colonoscopy on 5/13 confirmed malignant tumor adjacent to the IC valve. Pathology from proximal mass with invasive adenocarcinoma.  Pathology of polyp from transverse colon and splenic flexure with at least high-grade dysplasia. MRI abdomen with and without contrast without significant finding within the visualized portion of the abdomen. Patient underwent laparoscopic proximal hemicolectomy with reanastomosis on 5/16.  Post op treatment now per surgery, complicated by ileus, slowly improving.  Assessment & Plan:   Principal Problem:   Cecal cancer & polyps s/p lap hemicolectomy 10/31/2020 Active Problems:   Paroxysmal atrial fibrillation (HCC)   Chronic anticoagulation   Anemia   GI bleed   Colonic mass   High grade dysplasia in colonic adenomas transverse colon & splenic flexure   Colon cancer (HCC)   Hypophosphatemia  Adenocarcinoma of proximal colon with ileocecal adenopathy: Noted on CT abdomen and pelvis and colonoscopy. MRI as above.  Pathology from proximal mass with invasive adenocarcinoma.  Pathology of polyp from transverse colon and splenic flexure with at least high-grade dysplasia.  -S/p laparoscopic proximal hemicolectomy with reanastomosis by Dr. Johney Maine on 5/16 -General surgery managing -Follow surgical pathology -> invasive moderately differentiated adenocarcinoma with mucinous features, tumor invades muscularis propia, margins of resction not involved, 21 LN's  negative for carcinoma.  - Dr. Johney Maine planning for patient to follow up with tumor board - Will need outpatient oncology follow up  Nausea  Vomiting  Ileus  - plain film with dilated small bowel loobs concerning for distal small bowel obstruction or ileus - NGT d/c'd 5/21, advance diet per surgery - per surgery, appreciate assistance  Diarrhea - negative c diff studies  Severe iron deficiency anemia due to acute on chronic blood loss from GI bleed: FOBT positive.  Hgb 13.7 in 2019, and reportedly about 12 in January 2022 per daughter. - Hb relatively stable today, follow - labs c/w iron def anemia, low normal B12 (follow MMA - pending)  Paroxysmal [name]. Fib with RVR: On Toprol-XL, Cardizem and Eliquis at home.  CHA2DS2-VASc score 4 (age, sex, HTN) -rate well controlled today -continue home dilt and metop - Continue eliquis   Essential hypertension: Normotensive. -metop and diltiazide  -Continue holding Maxzide for now  Hypokalemia/hypophosphatemia: Initially may have been related to thiazide.  Now, likely related to diarrhea and poor PO intake, recent NG to LIS. - replace and follow and follow  Hyperglycemia - follow A1c  Leukocytosis - improving  DVT prophylaxis: lovenox Code Status: full Family Communication: daughter over phone in room - 5/23 Disposition:   Status is: Inpatient  Remains inpatient appropriate because:Inpatient level of care appropriate due to severity of illness   Dispo: The patient is from: Home              Anticipated d/c is to: pending              Patient currently is not medically stable to d/c.   Difficult to place patient No       Consultants:  Surgery  GI  Procedures:  5/16 PROCEDURE:   LAPAROSCOPIC PROXIMAL HEMICOLECTOMY  OMENTOPEXY OF ANASTOMOSIS LAPAROSCOPIC LYSIS OF ADHESIONS TRANSVERSUS ABDOMINIS PLANE (TAP) BLOCK - BILATERAL  Colonoscopy - Obvious malignant tumor adjacent to the IC valve. Biopsied. - Several  polyps were also removed (mixed snare cautery and cold snare). The largest was located at the presumed splenic flexure, removed piecemeal, and although it was soft and not overtly malignant I labled the site with submucosal Spot injections given it's size (2.5cm). - Return patient to hospital ward for ongoing care. - Will need staging tests with CT scan chest, abd, pelvis. CEA. - I recommend continuing to hold eliquis for now. Await pathology results. - General surgery has already been consulted. - [facility] will return to see her on Monday. Drs. Collene Mares and Benson Norway are covering this weekend if needed.  Antimicrobials: Anti-infectives (From admission, onward)   Start     Dose/Rate Route Frequency Ordered Stop   11/08/20 0045  vancomycin (VANCOCIN) 50 mg/mL oral solution 125 mg  Status:  Discontinued        125 mg Oral Every 6 hours 11/07/20 2354 11/08/20 1304   10/31/20 2200  cefoTEtan (CEFOTAN) 2 g in sodium chloride 0.9 % 100 mL IVPB        2 g 200 mL/hr over 30 Minutes Intravenous Every 12 hours 10/31/20 1744 10/31/20 2205   10/31/20 0915  cefoTEtan (CEFOTAN) 2 g in sodium chloride 0.9 % 100 mL IVPB        2 g 200 mL/hr over 30 Minutes Intravenous On call to O.R. 10/31/20 0824 10/31/20 1312        Subjective: No new complaints  Objective: Vitals:   11/07/20 2142 11/08/20 0500 11/08/20 0612 11/08/20 1243  BP: (!) 141/70  134/74 (!) 148/85  Pulse: 100  91 87  Resp: 20  (!) 23 20  Temp: 98.3 F (36.8 C)  98.1 F (36.7 C) 98.6 F (37 C)  TempSrc: Oral  Oral   SpO2: 98%  98% 98%  Weight:  65.2 kg    Height:        Intake/Output Summary (Last 24 hours) at 11/08/2020 1528 Last data filed at 11/08/2020 0924 Gross per 24 hour  Intake 871.59 ml  Output --  Net 871.59 ml   Filed Weights   11/06/20 0655 11/07/20 2000 11/08/20 0500  Weight: 67 kg 65.6 kg 65.2 kg    Examination:  General: No acute distress. Cardiovascular: irregularly irregular  Lungs: Clear to auscultation  bilaterally Abdomen: Soft, nontender, nondistended - intact surgical incision, well appearing Neurological: Alert and oriented 3. Moves all extremities 4 . Cranial nerves II through XII grossly intact. Skin: Warm and dry. No rashes or lesions. Extremities: No clubbing or cyanosis. No edema.  Data Reviewed: I have personally reviewed following labs and imaging studies  CBC: Recent Labs  Lab 11/03/20 0523 11/04/20 0425 11/05/20 0527 11/06/20 0521 11/07/20 0508 11/08/20 0427  WBC 17.7* 11.6* 10.9* 17.9* 16.4* 14.0*  NEUTROABS 15.8* 10.1* 9.2*  --   --  11.7*  HGB 10.3* 9.9* 10.0* 10.1* 10.4* 11.1*  HCT 34.8* 33.6* 33.9* 34.0* 35.2* 38.9  MCV 78.0* 79.8* 79.4* 80.0 81.1 82.8  PLT 333 281 304 300 286 630    Basic Metabolic Panel: Recent Labs  Lab 11/04/20 0425 11/04/20 1536 11/05/20 0527 11/06/20 0521 11/07/20 0508 11/08/20 0427  NA 139 135 135 137 137 135  K 2.6* 3.2* 3.3* 3.4* 3.6 3.1*  CL 104 102 102 105  104 103  CO2 28 26 26 26 28 24   GLUCOSE 106* 121* 156* 154* 175* 98  BUN 11 11 11 16 18 22   CREATININE 0.50 0.53 0.41* 0.44 0.52 0.56  CALCIUM 8.7* 8.9 8.8* 8.9 8.7* 8.9  MG 2.0  --  1.9 1.8 1.7 1.9  PHOS 1.7*  --  1.4* 2.3* 2.1* 2.5    GFR: Estimated Creatinine Clearance: 46 mL/min (by C-G formula based on SCr of 0.56 mg/dL).  Liver Function Tests: Recent Labs  Lab 11/04/20 0425 11/05/20 0527 11/06/20 0521 11/07/20 0508 11/08/20 0427  AST 17 15 15 24 18   ALT 19 20 23 30 28   ALKPHOS 84 95 114 105 120  BILITOT 1.9* 1.5* 1.7* 1.3* 1.7*  PROT 5.6* 5.4* 5.4* 5.2* 5.6*  ALBUMIN 2.9* 2.7* 2.7* 2.5* 2.7*    CBG: No results for input(s): GLUCAP in the last 168 hours.   Recent Results (from the past 240 hour(s))  Surgical pcr screen     Status: None   Collection Time: 10/31/20 10:19 AM   Specimen: Nasal Mucosa; Nasal Swab  Result Value Ref Range Status   MRSA, PCR NEGATIVE NEGATIVE Final   Staphylococcus aureus NEGATIVE NEGATIVE Final    Comment:  (NOTE) The Xpert SA Assay (FDA approved for NASAL specimens in patients 65 years of age and older), is one component of Azari Janssens comprehensive surveillance program. It is not intended to diagnose infection nor to guide or monitor treatment. Performed at [facility], [place] [address], [place], [place] [address]   C Difficile Quick Screen (NO PCR Reflex)     Status: None   Collection Time: 11/07/20  2:01 PM   Specimen: STOOL  Result Value Ref Range Status   C Diff antigen NEGATIVE NEGATIVE Final   C Diff toxin NEGATIVE NEGATIVE Final   C Diff interpretation No C. difficile detected.  Final    Comment: Performed at [facility], [place] [address], [place], [place] [address]         Radiology Studies: No results found.      Scheduled Meds: . apixaban  2.5 mg Oral BID  . [START ON 11/09/2020] apixaban  5 mg Oral BID  . diltiazem  180 mg Oral Daily  . feeding supplement  1 Container Oral TID BM  . liver oil-zinc oxide   Topical BID  . metoprolol tartrate  50 mg Oral BID  . potassium chloride  40 mEq Oral BID   Continuous Infusions: . methocarbamol (ROBAXIN) IV       LOS: 12 days    Time spent: over 30 min    Fayrene Helper, MD [facility]   To contact the attending provider between 7A-7P or the covering provider during after hours 7P-7A, please log into the web site [email or web address] and access using universal [place] password for that web site. If you do not have the password, please call the hospital operator.  11/08/2020, 3:28 PM

## 2020-11-09 DIAGNOSIS — C180 Malignant neoplasm of cecum: Secondary | ICD-10-CM | POA: Diagnosis not present

## 2020-11-09 LAB — CBC
HCT: 38.2 % (ref 36.0–46.0)
Hemoglobin: 11.2 g/dL — ABNORMAL LOW (ref 12.0–15.0)
MCH: 23.7 pg — ABNORMAL LOW (ref 26.0–34.0)
MCHC: 29.3 g/dL — ABNORMAL LOW (ref 30.0–36.0)
MCV: 80.9 fL (ref 80.0–100.0)
Platelets: 402 10*3/uL — ABNORMAL HIGH (ref 150–400)
RBC: 4.72 MIL/uL (ref 3.87–5.11)
RDW: 24.9 % — ABNORMAL HIGH (ref 11.5–15.5)
WBC: 12.6 10*3/uL — ABNORMAL HIGH (ref 4.0–10.5)
nRBC: 0 % (ref 0.0–0.2)

## 2020-11-09 LAB — DIFFERENTIAL
Abs Immature Granulocytes: 0.11 10*3/uL — ABNORMAL HIGH (ref 0.00–0.07)
Basophils Absolute: 0 10*3/uL (ref 0.0–0.1)
Basophils Relative: 0 %
Eosinophils Absolute: 0.1 10*3/uL (ref 0.0–0.5)
Eosinophils Relative: 1 %
Immature Granulocytes: 1 %
Lymphocytes Relative: 9 %
Lymphs Abs: 1.1 10*3/uL (ref 0.7–4.0)
Monocytes Absolute: 0.8 10*3/uL (ref 0.1–1.0)
Monocytes Relative: 6 %
Neutro Abs: 10.4 10*3/uL — ABNORMAL HIGH (ref 1.7–7.7)
Neutrophils Relative %: 83 %

## 2020-11-09 LAB — METHYLMALONIC ACID, SERUM: Methylmalonic Acid, Quantitative: 191 nmol/L (ref 0–378)

## 2020-11-09 LAB — COMPREHENSIVE METABOLIC PANEL
ALT: 21 U/L (ref 0–44)
AST: 12 U/L — ABNORMAL LOW (ref 15–41)
Albumin: 2.6 g/dL — ABNORMAL LOW (ref 3.5–5.0)
Alkaline Phosphatase: 105 U/L (ref 38–126)
Anion gap: 6 (ref 5–15)
BUN: 20 mg/dL (ref 8–23)
CO2: 25 mmol/L (ref 22–32)
Calcium: 8.9 mg/dL (ref 8.9–10.3)
Chloride: 106 mmol/L (ref 98–111)
Creatinine, Ser: 0.45 mg/dL (ref 0.44–1.00)
GFR, Estimated: >60 mL/min (ref >60)
Glucose, Bld: 115 mg/dL — ABNORMAL HIGH (ref 70–99)
Potassium: 3.6 mmol/L (ref 3.5–5.1)
Sodium: 137 mmol/L (ref 135–145)
Total Bilirubin: 1.1 mg/dL (ref 0.3–1.2)
Total Protein: 5.2 g/dL — ABNORMAL LOW (ref 6.5–8.1)

## 2020-11-09 LAB — MAGNESIUM: Magnesium: 1.8 mg/dL (ref 1.7–2.4)

## 2020-11-09 LAB — PHOSPHORUS: Phosphorus: 2 mg/dL — ABNORMAL LOW (ref 2.5–4.6)

## 2020-11-09 MED ORDER — LOPERAMIDE HCL 2 MG PO CAPS: 2.0000 mg | ORAL_CAPSULE | Freq: Two times a day (BID) | ORAL | Status: DC | PRN

## 2020-11-09 MED ORDER — K PHOS MONO-SOD PHOS DI & MONO 155-852-130 MG PO TABS
250.0000 mg | ORAL_TABLET | Freq: Two times a day (BID) | ORAL | Status: DC
  Administered 2020-11-09 – 2020-11-10 (×3): 250 mg via ORAL
  Filled 2020-11-09 (×3): qty 1

## 2020-11-09 MED ORDER — LOPERAMIDE HCL 2 MG PO CAPS: 2.0000 mg | ORAL_CAPSULE | ORAL | Status: DC | PRN

## 2020-11-09 MED ORDER — SACCHAROMYCES BOULARDII 250 MG PO CAPS
250.0000 mg | ORAL_CAPSULE | Freq: Two times a day (BID) | ORAL | Status: DC
  Administered 2020-11-09 (×2): 250 mg via ORAL
  Filled 2020-11-09 (×2): qty 1

## 2020-11-09 NOTE — Care Management Important Message (Signed)
Important Message  Patient Details IM Letter given to the Patient. Name: Robin [name] MRN: [id number] Date of Birth: 11/03/1934   Medicare Important Message Given:  Yes     Kerin Salen 11/09/2020, 11:10 AM

## 2020-11-09 NOTE — Progress Notes (Signed)
Pt alert and aware sitting on the edge of bed. Family also present. Pt states she is going home and she is very happy. The chaplain offered caring and supportive presence, he sang a Public librarian and offered prayers and blessings. The family grateful for visit.

## 2020-11-09 NOTE — Progress Notes (Signed)
PROGRESS NOTE    Robin [name]  LKJ:[id number] DOB: Feb 10, 1935 DOA: 10/26/2020 PCP: [name], PA-C   No chief complaint on file.  Brief Narrative:  86 year old F with PMH of A. fib on Eliquis and HTN presented to ED for anemia and shortness of breath, and admitted for blood loss anemia due to GI bleed.  Hgb 8.5 (reportedly 11 in January 2022 per daughter).  Hemoccult positive.  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  Colonoscopy on 5/13 confirmed malignant tumor adjacent to the IC valve. Pathology from proximal mass with invasive adenocarcinoma.  Pathology of polyp from transverse colon and splenic flexure with at least high-grade dysplasia. MRI abdomen with and without contrast without significant finding within the visualized portion of the abdomen. Patient underwent laparoscopic proximal hemicolectomy with reanastomosis on 5/16.  Post op treatment now per surgery, complicated by ileus, slowly improving.  Assessment & Plan:   Principal Problem:   Cecal cancer & polyps s/p lap hemicolectomy 10/31/2020 Active Problems:   Paroxysmal atrial fibrillation (HCC)   Chronic anticoagulation   Anemia   GI bleed   Colonic mass   High grade dysplasia in colonic adenomas transverse colon & splenic flexure   Colon cancer (HCC)   Hypophosphatemia  Adenocarcinoma of proximal colon with ileocecal adenopathy: Noted on CT abdomen and pelvis and colonoscopy. MRI as above.  Pathology from proximal mass with invasive adenocarcinoma.  Pathology of polyp from transverse colon and splenic flexure with at least high-grade dysplasia.  -S/p laparoscopic proximal hemicolectomy with reanastomosis by Dr. Johney Maine on 5/16 -General surgery managing -Follow surgical pathology -> invasive moderately differentiated adenocarcinoma with mucinous features, tumor invades muscularis propia, margins of resction not involved, 21 LN's  negative for carcinoma.  - Dr. Johney Maine planning for patient to follow up with tumor board - Will need outpatient oncology follow up  Nausea  Vomiting  Ileus  - plain film with dilated small bowel loobs concerning for distal small bowel obstruction or ileus - NGT d/c'd 5/21, has been advanced to regular diet by surgery today.  Diarrhea - negative c diff studies.  Will start on as needed Imodium.  Surgery cleared.  Severe iron deficiency anemia due to acute on chronic blood loss from GI bleed: FOBT positive.  Hgb 13.7 in 2019, and reportedly about 12 in January 2022 per daughter. - Hb relatively stable today, follow - labs c/w iron def anemia, low normal B12 (follow MMA - pending)  Paroxysmal A. Fib with RVR: On Toprol-XL, Cardizem and Eliquis at home.  CHA2DS2-VASc score 4 (age, sex, HTN) -rate well controlled today -continue home dilt and metop and Eliquis.  Essential hypertension: Normotensive. -Continue metop and diltiazide  -Continue holding Maxzide for now  Hypokalemia/hypophosphatemia: Initially may have been related to thiazide.  Now, likely related to diarrhea and poor PO intake, recent NG to LIS.  Resume normal diet hospitalist team.  Will start on K-Phos.  Hyperglycemia -Hemoglobin A1c within normal range.  Leukocytosis - improving  DVT prophylaxis: Eliquis Code Status: full Family Communication: None present at bedside. Disposition:   Status is: Inpatient  Remains inpatient appropriate because:Inpatient level of care appropriate due to severity of illness   Dispo: The patient is from: Home              Anticipated d/c is to: Home              Patient currently is not medically stable to d/c.  Difficult to place patient No       Consultants:   Surgery  GI  Procedures:  5/16 PROCEDURE:   LAPAROSCOPIC PROXIMAL HEMICOLECTOMY  OMENTOPEXY OF ANASTOMOSIS LAPAROSCOPIC LYSIS OF ADHESIONS TRANSVERSUS ABDOMINIS PLANE (TAP) BLOCK -  BILATERAL  Colonoscopy - Obvious malignant tumor adjacent to the IC valve. Biopsied. - Several polyps were also removed (mixed snare cautery and cold snare). The largest was located at the presumed splenic flexure, removed piecemeal, and although it was soft and not overtly malignant I labled the site with submucosal Spot injections given it's size (2.5cm). - Return patient to hospital ward for ongoing care. - Will need staging tests with CT scan chest, abd, pelvis. CEA. - I recommend continuing to hold eliquis for now. Await pathology results. - General surgery has already been consulted. - [facility] will return to see her on Monday. Drs. Collene Mares and Benson Norway are covering this weekend if needed.  Antimicrobials: Anti-infectives (From admission, onward)   Start     Dose/Rate Route Frequency Ordered Stop   11/08/20 0045  vancomycin (VANCOCIN) 50 mg/mL oral solution 125 mg  Status:  Discontinued        125 mg Oral Every 6 hours 11/07/20 2354 11/08/20 1304   10/31/20 2200  cefoTEtan (CEFOTAN) 2 g in sodium chloride 0.9 % 100 mL IVPB        2 g 200 mL/hr over 30 Minutes Intravenous Every 12 hours 10/31/20 1744 10/31/20 2205   10/31/20 0915  cefoTEtan (CEFOTAN) 2 g in sodium chloride 0.9 % 100 mL IVPB        2 g 200 mL/hr over 30 Minutes Intravenous On call to O.R. 10/31/20 0824 10/31/20 1312        Subjective: Patient seen and examined.  She is still is having diarrhea although improved compared to yesterday.  Tolerating diet well.  No other complaint.  Objective: Vitals:   11/08/20 2039 11/09/20 0352 11/09/20 0500 11/09/20 1157  BP:    (!) 155/84  Pulse: 88   (!) 125  Resp:    16  Temp:  98.8 F (37.1 C)  98.1 F (36.7 C)  TempSrc:  Oral    SpO2:    100%  Weight:   64.4 kg   Height:        Intake/Output Summary (Last 24 hours) at 11/09/2020 1315 Last data filed at 11/08/2020 1900 Gross per 24 hour  Intake 120 ml  Output --  Net 120 ml   Filed Weights   11/07/20 2000  11/08/20 0500 11/09/20 0500  Weight: 65.6 kg 65.2 kg 64.4 kg    Examination:  General exam: Appears calm and comfortable  Respiratory system: Clear to auscultation. Respiratory effort normal. Cardiovascular system: S1 & S2 heard, RRR. No JVD, murmurs, rubs, gallops or clicks. No pedal edema. Gastrointestinal system: Abdomen is very slightly distended, soft and nontender. No organomegaly or masses felt. Normal bowel sounds heard. Central nervous system: Alert and oriented. No focal neurological deficits. Extremities: Symmetric 5 x 5 power. Skin: No rashes, lesions or ulcers.  Psychiatry: Judgement and insight appear normal. Mood & affect appropriate.   Data Reviewed: I have personally reviewed following labs and imaging studies  CBC: Recent Labs  Lab 11/03/20 0523 11/04/20 0425 11/05/20 0527 11/06/20 0521 11/07/20 0508 11/08/20 0427 11/09/20 0652  WBC 17.7* 11.6* 10.9* 17.9* 16.4* 14.0* 12.6*  NEUTROABS 15.8* 10.1* 9.2*  --   --  11.7* 10.4*  HGB 10.3* 9.9* 10.0* 10.1* 10.4* 11.1* 11.2*  HCT  34.8* 33.6* 33.9* 34.0* 35.2* 38.9 38.2  MCV 78.0* 79.8* 79.4* 80.0 81.1 82.8 80.9  PLT 333 281 304 300 286 370 402*    Basic Metabolic Panel: Recent Labs  Lab 11/05/20 0527 11/06/20 0521 11/07/20 0508 11/08/20 0427 11/09/20 0447  NA 135 137 137 135 137  K 3.3* 3.4* 3.6 3.1* 3.6  CL 102 105 104 103 106  CO2 26 26 28 24 25   GLUCOSE 156* 154* 175* 98 115*  BUN 11 16 18 22 20   CREATININE 0.41* 0.44 0.52 0.56 0.45  CALCIUM 8.8* 8.9 8.7* 8.9 8.9  MG 1.9 1.8 1.7 1.9 1.8  PHOS 1.4* 2.3* 2.1* 2.5 2.0*    GFR: Estimated Creatinine Clearance: 46 mL/min (by C-G formula based on SCr of 0.45 mg/dL).  Liver Function Tests: Recent Labs  Lab 11/05/20 0527 11/06/20 0521 11/07/20 0508 11/08/20 0427 11/09/20 0447  AST 15 15 24 18  12*  ALT 20 23 30 28 21   ALKPHOS 95 114 105 120 105  BILITOT 1.5* 1.7* 1.3* 1.7* 1.1  PROT 5.4* 5.4* 5.2* 5.6* 5.2*  ALBUMIN 2.7* 2.7* 2.5* 2.7* 2.6*     CBG: No results for input(s): GLUCAP in the last 168 hours.   Recent Results (from the past 240 hour(s))  Surgical pcr screen     Status: None   Collection Time: 10/31/20 10:19 AM   Specimen: Nasal Mucosa; Nasal Swab  Result Value Ref Range Status   MRSA, PCR NEGATIVE NEGATIVE Final   Staphylococcus aureus NEGATIVE NEGATIVE Final    Comment: (NOTE) The Xpert SA Assay (FDA approved for NASAL specimens in patients 59 years of age and older), is one component of a comprehensive surveillance program. It is not intended to diagnose infection nor to guide or monitor treatment. Performed at [facility], [place] [address], [place], [place] [address]   C Difficile Quick Screen (NO PCR Reflex)     Status: None   Collection Time: 11/07/20  2:01 PM   Specimen: STOOL  Result Value Ref Range Status   C Diff antigen NEGATIVE NEGATIVE Final   C Diff toxin NEGATIVE NEGATIVE Final   C Diff interpretation No C. difficile detected.  Final    Comment: Performed at [facility], [place] [address], [place], [place] [address]         Radiology Studies: No results found.      Scheduled Meds: . apixaban  5 mg Oral BID  . diltiazem  180 mg Oral Daily  . feeding supplement  1 Container Oral TID BM  . liver oil-zinc oxide   Topical BID  . metoprolol tartrate  50 mg Oral BID  . saccharomyces boulardii  250 mg Oral BID   Continuous Infusions: . methocarbamol (ROBAXIN) IV       LOS: 13 days    Time spent: 32 minutes  Darliss Cheney, MD [facility]   To contact the attending provider between 7A-7P or the covering provider during after hours 7P-7A, please log into the web site [email or web address] and access using universal [place] password for that web site. If you do not have the password, please call the hospital operator.  11/09/2020, 1:15 PM

## 2020-11-09 NOTE — Progress Notes (Addendum)
[facility] Progress Note  9 Days Post-Op  Subjective: CC-  On bedside commode. States that she continued to have a lot of loose stool yesterday, about every 2 hours, but over night this seems to be slowing down a little. Stool slightly thicker. Tolerating soft diet but not eating a lot. Denies n/v.   Objective: Vital signs in last 24 hours: Temp:  [98 F (36.7 C)-98.8 F (37.1 C)] 98.8 F (37.1 C) (05/25 0352) Pulse Rate:  [87-94] 88 (05/24 2039) Resp:  [19-20] 19 (05/24 2030) BP: (148-155)/(77-85) 155/77 (05/24 2028) SpO2:  [98 %-99 %] 98 % (05/24 2030) Weight:  [64.4 kg] 64.4 kg (05/25 0500) Last BM Date: 11/08/20  Intake/Output from previous day: 05/24 0701 - 05/25 0700 In: 240 [P.O.:240] Out: -  Intake/Output this shift: No intake/output data recorded.  PE: Gen:  Alert, NAD, pleasant Pulm:  rate and effort normal Abd: soft, mild distension, nontender, +BS, incisions cdi without erythema or drainage  Lab Results:  Recent Labs    11/08/20 0427 11/09/20 0652  WBC 14.0* 12.6*  HGB 11.1* 11.2*  HCT 38.9 38.2  PLT 370 402*   BMET Recent Labs    11/08/20 0427 11/09/20 0447  NA 135 137  K 3.1* 3.6  CL 103 106  CO2 24 25  GLUCOSE 98 115*  BUN 22 20  CREATININE 0.56 0.45  CALCIUM 8.9 8.9   PT/INR No results for input(s): LABPROT, INR in the last 72 hours. CMP     Component Value Date/Time   NA 137 11/09/2020 0447   NA 139 02/21/2018 0854   K 3.6 11/09/2020 0447   CL 106 11/09/2020 0447   CO2 25 11/09/2020 0447   GLUCOSE 115 (H) 11/09/2020 0447   BUN 20 11/09/2020 0447   BUN 20 02/21/2018 0854   CREATININE 0.45 11/09/2020 0447   CREATININE 0.54 (L) 11/28/2015 1422   CALCIUM 8.9 11/09/2020 0447   PROT 5.2 (L) 11/09/2020 0447   ALBUMIN 2.6 (L) 11/09/2020 0447   AST 12 (L) 11/09/2020 0447   ALT 21 11/09/2020 0447   ALKPHOS 105 11/09/2020 0447   BILITOT 1.1 11/09/2020 0447   GFRNONAA >60 11/09/2020 0447   GFRAA 78 02/21/2018 0854    Lipase     Component Value Date/Time   LIPASE 31 10/26/2020 1919       Studies/Results: No results found.  Anti-infectives: Anti-infectives (From admission, onward)   Start     Dose/Rate Route Frequency Ordered Stop   11/08/20 0045  vancomycin (VANCOCIN) 50 mg/mL oral solution 125 mg  Status:  Discontinued        125 mg Oral Every 6 hours 11/07/20 2354 11/08/20 1304   10/31/20 2200  cefoTEtan (CEFOTAN) 2 g in sodium chloride 0.9 % 100 mL IVPB        2 g 200 mL/hr over 30 Minutes Intravenous Every 12 hours 10/31/20 1744 10/31/20 2205   10/31/20 0915  cefoTEtan (CEFOTAN) 2 g in sodium chloride 0.9 % 100 mL IVPB        2 g 200 mL/hr over 30 Minutes Intravenous On call to O.R. 10/31/20 0824 10/31/20 1312       Assessment/Plan Atrial fibrillation - on toprol, cardizem, eliquis HTN HLD Diarrhea - c diff negative, improving --per [facility]--  POD#9-s/p lap assisted right hemicolectomy for ascending colon mass (adenocarcinoma) and multiple polyps (likely pre-malignant), Dr. Johney Maine 10/31/20 - surgical path: Invasive moderately differentiated adenocarcinoma with mucinous  features, 4 cm; tumor invades the muscularis propria;  margins of resection are not involved; twenty-one lymph nodes, negative for carcinoma (0/21).  - Advance to heart healthy diet. Diarrhea slowly improving. Add probiotic, should continue to improve as she eats more solid food. Ok for imodium BID PRN. Once diarrhea is more manageable she is will ok for discharge from surgical standpoint, possibly tomorrow.  - continue therapies, mobilize  FEN -HH diet VTE - eliquis ID -Cefotetanpre-op, PO vancomycin 5/24>>5/24   LOS: 13 days    Wellington Hampshire, [facility] 11/09/2020, 8:27 AM Please see Amion for pager number during day hours 7:00am-4:30pm

## 2020-11-09 NOTE — TOC Initial Note (Signed)
Transition of Care [facility]) - Initial/Assessment Note    Patient Details  Name: Robin [name] MRN: [id number] Date of Birth: 1935/03/09  Transition of Care [facility]) CM/SW Contact:    Dessa Phi, RN Phone Number: 11/09/2020, 2:32 PM  Clinical Narrative:Spoke to patient about recc-PT no f/u;OT-HHOT(not a covered skill by insurance)can private pay for custodial level care.Patient voiced understanding.Has [facility] [employer]-TC rep Iva Lento will assess if qualifies for community services, Left vm for dtr Sharon-await call back to inform of discussion.No further CM needs.                  Expected Discharge Plan: Home/Self Care Barriers to Discharge: Continued Medical Work up   Patient Goals and CMS Choice Patient states their goals for this hospitalization and ongoing recovery are:: go home CMS Medicare.gov Compare Post Acute Care list provided to:: Patient    Expected Discharge Plan and Services Expected Discharge Plan: Home/Self Care   Discharge Planning Services: CM Consult   Living arrangements for the past 2 months: Single Family Home                                      Prior Living Arrangements/Services Living arrangements for the past 2 months: Single Family Home Lives with:: Self Patient language and need for interpreter reviewed:: Yes Do you feel safe going back to the place where you live?: Yes      Need for Family Participation in Patient Care: No (Comment) Care giver support system in place?: Yes (comment)   Criminal Activity/Legal Involvement Pertinent to Current Situation/Hospitalization: No - Comment as needed  Activities of Daily Living Home Assistive Devices/Equipment: Eyeglasses ADL Screening (condition at time of admission) Patient's cognitive ability adequate to safely complete daily activities?: Yes Is the patient deaf or have difficulty hearing?: No Does the patient have difficulty seeing, even when wearing glasses/contacts?: No Does the patient  have difficulty concentrating, remembering, or making decisions?: No Patient able to express need for assistance with ADLs?: Yes Does the patient have difficulty dressing or bathing?: No Independently performs ADLs?: Yes (appropriate for developmental age) Does the patient have difficulty walking or climbing stairs?: Yes (secondary to recent shortness of breath) Weakness of Legs: None Weakness of Arms/Hands: None  Permission Sought/Granted Permission sought to share information with : Case Manager Permission granted to share information with : Yes, Verbal Permission Granted  Share Information with NAME: Case Manager     Permission granted to share info w Relationship: [name] dtr [phone]     Emotional Assessment Appearance:: Appears stated age Attitude/Demeanor/Rapport: Gracious Affect (typically observed): Accepting Orientation: : Oriented to Self,Oriented to Place,Oriented to  Time,Oriented to Situation Alcohol / Substance Use: Not Applicable Psych Involvement: No (comment)  Admission diagnosis:  Shortness of breath [R06.02] Anemia [D64.9] Heme positive stool [R19.5] Anemia, unspecified type [D64.9] Colon cancer [facility]) [C18.9] Patient Active Problem List   Diagnosis Date Noted  . Hypophosphatemia 11/03/2020  . Cecal cancer & polyps s/p lap hemicolectomy 10/31/2020 10/31/2020  . High grade dysplasia in colonic adenomas transverse colon & splenic flexure 10/31/2020  . Colon cancer ([place]) 10/31/2020  . Colonic mass   . GI bleed 10/27/2020  . Anemia 10/26/2020  . Chronic anticoagulation 02/12/2019  . Venous insufficiency (chronic) (peripheral) 02/12/2019  . Normal coronary arteries 02/12/2019  . Carotid stenosis 03/02/2017  . Paroxysmal atrial fibrillation ([place]) 08/05/2015  . First degree heart block by electrocardiogram  06/03/2014  . Hypokalemia 03/02/2014  . PAC (premature atrial contraction) 05/05/2012  . Hypercholesterolemia 06/13/2011  . Benign hypertensive heart  disease without heart failure 12/11/2010   PCP:  [name], PA-C Pharmacy:   [facility], [place] [address] Phone: [phone] Fax: [phone]     Social Determinants of Health (SDOH) Interventions    Readmission Risk Interventions No flowsheet data found.

## 2020-11-09 NOTE — Progress Notes (Addendum)
Physical Therapy Treatment Patient Details Name: Robin [name] MRN: [id number] DOB: Feb 08, 1935 Today's Date: 11/09/2020    History of Present Illness 86 year old F presented to ED for anemia and shortness of breath, and admitted for blood loss anemia due to GI bleed.  Hgb 8.5 (reportedly 11 in January 2022 per daughter).  Hemoccult positive.  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 s/p laparoscopic hemicolectomy 10/31/20. Pt  with PMH of A. fib on Eliquis and HTN.    PT Comments    Pt just finished using BSC on arrival and encouraged to ambulate prior to returning to bed for a nap.  Pt only requiring supervision for mobility at this time and slowly progressing.   Addend:  RN called to relay that pt and daughter are concerned about d/c home tomorrow.  Discussed pt not needing physical assist for mobility and ambulating good distance.  Pt reports fear for when family not present so updated note to recommend [facility] to pt has all equipment nearby if family not present.  Pt may also benefit from an aide to further assist while recovering at home.   Follow Up Recommendations  No PT follow up     Equipment Recommendations  3in1 (PT)    Recommendations for Other Services       Precautions / Restrictions Precautions Precautions: Other (comment) Precaution Comments: abdominal surgery    Mobility  Bed Mobility Overal bed mobility: Needs Assistance Bed Mobility: Sit to Supine       Sit to supine: Supervision        Transfers Overall transfer level: Needs assistance Equipment used: Rolling walker (2 wheeled) Transfers: Sit to/from Stand Sit to Stand: Supervision            Ambulation/Gait Ambulation/Gait assistance: Min guard;Supervision Gait Distance (Feet): 200 Feet Assistive device: Rolling walker (2 wheeled) Gait Pattern/deviations: Step-through pattern;Decreased stride length Gait  velocity: decreased   General Gait Details: cues for posture, reports fatigue, HR elevated to 130 bpm (pt reports she has not taken her meds yet)   Stairs             Wheelchair Mobility    Modified Rankin (Stroke Patients Only)       Balance                                            Cognition Arousal/Alertness: Awake/alert Behavior During Therapy: WFL for tasks assessed/performed Overall Cognitive Status: Within Functional Limits for tasks assessed                                        Exercises      General Comments        Pertinent Vitals/Pain Pain Assessment: No/denies pain Pain Intervention(s): Repositioned;Monitored during session    Home Living                      Prior Function            PT Goals (current goals can now be found in the care plan section) Progress towards PT goals: Progressing toward goals    Frequency    Min 3X/week      PT Plan Current plan remains appropriate    Co-evaluation  AM-PAC PT "6 Clicks" Mobility   Outcome Measure  Help needed turning from your back to your side while in a flat bed without using bedrails?: A Little Help needed moving from lying on your back to sitting on the side of a flat bed without using bedrails?: A Little Help needed moving to and from a bed to a chair (including a wheelchair)?: A Little Help needed standing up from a chair using your arms (e.g., wheelchair or bedside chair)?: A Little Help needed to walk in hospital room?: A Little Help needed climbing 3-5 steps with a railing? : A Little 6 Click Score: 18    End of Session Equipment Utilized During Treatment: Gait belt Activity Tolerance: Patient tolerated treatment well Patient left: with call bell/phone within reach;in bed;with bed alarm set Nurse Communication: Mobility status PT Visit Diagnosis: Difficulty in walking, not elsewhere classified (R26.2)      Time: 6945-0388 PT Time Calculation (min) (ACUTE ONLY): 15 min  Charges:  $Gait Training: 8-22 mins                     Arlyce Dice, DPT Acute Rehabilitation Services Pager: [phone] Office: [phone]  [place] Ram E 11/09/2020, 3:03 PM

## 2020-11-10 ENCOUNTER — Inpatient Hospital Stay (HOSPITAL_COMMUNITY): Payer: Medicare PPO

## 2020-11-10 ENCOUNTER — Inpatient Hospital Stay: Payer: Self-pay

## 2020-11-10 DIAGNOSIS — K8020 Calculus of gallbladder without cholecystitis without obstruction: Secondary | ICD-10-CM

## 2020-11-10 DIAGNOSIS — C180 Malignant neoplasm of cecum: Secondary | ICD-10-CM | POA: Diagnosis not present

## 2020-11-10 LAB — CBC WITH DIFFERENTIAL/PLATELET
Abs Immature Granulocytes: 0.18 10*3/uL — ABNORMAL HIGH (ref 0.00–0.07)
Basophils Absolute: 0.1 10*3/uL (ref 0.0–0.1)
Basophils Relative: 0 %
Eosinophils Absolute: 0 10*3/uL (ref 0.0–0.5)
Eosinophils Relative: 0 %
HCT: 40.3 % (ref 36.0–46.0)
Hemoglobin: 12 g/dL (ref 12.0–15.0)
Immature Granulocytes: 1 %
Lymphocytes Relative: 2 %
Lymphs Abs: 0.5 10*3/uL — ABNORMAL LOW (ref 0.7–4.0)
MCH: 23.6 pg — ABNORMAL LOW (ref 26.0–34.0)
MCHC: 29.8 g/dL — ABNORMAL LOW (ref 30.0–36.0)
MCV: 79.3 fL — ABNORMAL LOW (ref 80.0–100.0)
Monocytes Absolute: 0.7 10*3/uL (ref 0.1–1.0)
Monocytes Relative: 3 %
Neutro Abs: 21.7 10*3/uL — ABNORMAL HIGH (ref 1.7–7.7)
Neutrophils Relative %: 94 %
Platelets: 455 10*3/uL — ABNORMAL HIGH (ref 150–400)
RBC: 5.08 MIL/uL (ref 3.87–5.11)
RDW: 24.9 % — ABNORMAL HIGH (ref 11.5–15.5)
WBC: 23 10*3/uL — ABNORMAL HIGH (ref 4.0–10.5)
nRBC: 0 % (ref 0.0–0.2)

## 2020-11-10 LAB — BASIC METABOLIC PANEL
Anion gap: 9 (ref 5–15)
BUN: 22 mg/dL (ref 8–23)
CO2: 27 mmol/L (ref 22–32)
Calcium: 9.3 mg/dL (ref 8.9–10.3)
Chloride: 100 mmol/L (ref 98–111)
Creatinine, Ser: 0.56 mg/dL (ref 0.44–1.00)
GFR, Estimated: >60 mL/min (ref >60)
Glucose, Bld: 132 mg/dL — ABNORMAL HIGH (ref 70–99)
Potassium: 3.2 mmol/L — ABNORMAL LOW (ref 3.5–5.1)
Sodium: 136 mmol/L (ref 135–145)

## 2020-11-10 LAB — MAGNESIUM: Magnesium: 1.6 mg/dL — ABNORMAL LOW (ref 1.7–2.4)

## 2020-11-10 LAB — PHOSPHORUS: Phosphorus: 2.8 mg/dL (ref 2.5–4.6)

## 2020-11-10 MED ORDER — MAGNESIUM SULFATE IN D5W 1-5 GM/100ML-% IV SOLN
1.0000 g | Freq: Once | INTRAVENOUS | Status: AC
  Administered 2020-11-10: 1 g via INTRAVENOUS
  Filled 2020-11-10: qty 100

## 2020-11-10 MED ORDER — SIMETHICONE 80 MG PO CHEW
80.0000 mg | CHEWABLE_TABLET | Freq: Four times a day (QID) | ORAL | Status: DC
  Filled 2020-11-10: qty 1

## 2020-11-10 MED ORDER — POTASSIUM CHLORIDE 10 MEQ/100ML IV SOLN: 10.0000 meq | INTRAVENOUS | Status: DC

## 2020-11-10 MED ORDER — BARIUM SULFATE 2.1 % PO SUSP
450.0000 mL | Freq: Two times a day (BID) | ORAL | Status: AC
  Administered 2020-11-10 (×2): 450 mL via ORAL

## 2020-11-10 MED ORDER — MAGIC MOUTHWASH
15.0000 mL | Freq: Four times a day (QID) | ORAL | Status: DC | PRN
Start: 2020-11-10 — End: 2020-11-29
  Filled 2020-11-10: qty 15

## 2020-11-10 MED ORDER — PIPERACILLIN-TAZOBACTAM 3.375 G IVPB
3.3750 g | Freq: Three times a day (TID) | INTRAVENOUS | Status: DC
  Administered 2020-11-11 – 2020-11-15 (×13): 3.375 g via INTRAVENOUS
  Filled 2020-11-10 (×11): qty 50

## 2020-11-10 MED ORDER — BISACODYL 10 MG RE SUPP
10.0000 mg | Freq: Every day | RECTAL | Status: DC
  Filled 2020-11-10: qty 1

## 2020-11-10 MED ORDER — SODIUM CHLORIDE 0.9 % IV SOLN
100.0000 mg | INTRAVENOUS | Status: DC
  Administered 2020-11-12 – 2020-11-14 (×3): 100 mg via INTRAVENOUS
  Filled 2020-11-10 (×4): qty 100

## 2020-11-10 MED ORDER — DILTIAZEM HCL 25 MG/5ML IV SOLN
5.0000 mg | Freq: Once | INTRAVENOUS | Status: AC
  Administered 2020-11-10: 5 mg via INTRAVENOUS
  Filled 2020-11-10: qty 5

## 2020-11-10 MED ORDER — CALCIUM POLYCARBOPHIL 625 MG PO TABS
625.0000 mg | ORAL_TABLET | Freq: Two times a day (BID) | ORAL | Status: DC
  Filled 2020-11-10: qty 1

## 2020-11-10 MED ORDER — ACETAMINOPHEN 650 MG RE SUPP: 650.0000 mg | Freq: Four times a day (QID) | RECTAL | Status: DC | PRN

## 2020-11-10 MED ORDER — BISACODYL 10 MG RE SUPP: 10.0000 mg | Freq: Every day | RECTAL | Status: DC | PRN

## 2020-11-10 MED ORDER — SODIUM CHLORIDE 0.9 % IV SOLN: 100.0000 mg | INTRAVENOUS | Status: DC

## 2020-11-10 MED ORDER — LACTATED RINGERS IV BOLUS: 1000.0000 mL | Freq: Three times a day (TID) | INTRAVENOUS | Status: AC | PRN

## 2020-11-10 MED ORDER — SODIUM CHLORIDE 0.9 % IV SOLN
200.0000 mg | Freq: Once | INTRAVENOUS | Status: AC
  Administered 2020-11-10: 200 mg via INTRAVENOUS
  Filled 2020-11-10: qty 200

## 2020-11-10 MED ORDER — METOPROLOL TARTRATE 5 MG/5ML IV SOLN
5.0000 mg | Freq: Four times a day (QID) | INTRAVENOUS | Status: DC
  Administered 2020-11-10 – 2020-11-14 (×15): 5 mg via INTRAVENOUS
  Filled 2020-11-10 (×12): qty 5

## 2020-11-10 MED ORDER — POTASSIUM CHLORIDE 10 MEQ/100ML IV SOLN
10.0000 meq | INTRAVENOUS | Status: AC
  Administered 2020-11-10 (×5): 10 meq via INTRAVENOUS
  Filled 2020-11-10 (×5): qty 100

## 2020-11-10 MED ORDER — MAGNESIUM SULFATE 50 % IJ SOLN: 1.0000 g | Freq: Once | INTRAMUSCULAR | Status: DC

## 2020-11-10 MED ORDER — MENTHOL 3 MG MT LOZG
1.0000 | LOZENGE | OROMUCOSAL | Status: DC | PRN
Start: 2020-11-10 — End: 2020-11-29
  Filled 2020-11-10: qty 9

## 2020-11-10 MED ORDER — FENTANYL CITRATE (PF) 100 MCG/2ML IJ SOLN: 25.0000 ug | INTRAMUSCULAR | Status: DC | PRN

## 2020-11-10 MED ORDER — LIP MEDEX EX OINT
1.0000 "application " | TOPICAL_OINTMENT | Freq: Two times a day (BID) | CUTANEOUS | Status: DC
  Administered 2020-11-10 – 2020-11-29 (×38): 1 via TOPICAL
  Filled 2020-11-10 (×3): qty 7

## 2020-11-10 MED ORDER — PHENOL 1.4 % MT LIQD: 2.0000 | OROMUCOSAL | Status: DC | PRN

## 2020-11-10 MED ORDER — PIPERACILLIN-TAZOBACTAM 3.375 G IVPB 30 MIN
3.3750 g | Freq: Once | INTRAVENOUS | Status: AC
  Administered 2020-11-10: 3.375 g via INTRAVENOUS
  Filled 2020-11-10: qty 50

## 2020-11-10 MED ORDER — ENOXAPARIN SODIUM 40 MG/0.4ML IJ SOSY
40.0000 mg | PREFILLED_SYRINGE | Freq: Every day | INTRAMUSCULAR | Status: DC
  Administered 2020-11-10: 40 mg via SUBCUTANEOUS
  Filled 2020-11-10: qty 0.4

## 2020-11-10 NOTE — TOC Progression Note (Signed)
Transition of Care [facility]) - Progression Note    Patient Details  Name: MERIT GADSBY MRN: [id number] Date of Birth: 09-May-1935  Transition of Care [facility]) CM/SW Contact  Pria Klosinski, Juliann Pulse, RN Phone Number: 11/10/2020, 11:34 AM  Clinical Narrative: Await [place] agency choice from dtr [facility] left vm.   1. [place] [phone] Quality rating Patient survey rating 2. [place] [phone] Quality rating Patient survey rating 3. [employer] [phone] Quality rating Patient survey rating 4. [facility] [phone] Quality rating Patient survey rating 5. [place] [phone] Quality rating Patient survey rating 6. [facility] [phone] Quality rating Patient survey rating 7. [place] [phone] Quality rating Patient survey rating 8. [place] [phone] Quality rating Not available4 Patient survey rating Not available9 9. [facility] [phone] Quality rating Patient survey rating 10. [facility] [phone] Quality rating Patient survey rating 11. [place] [phone] Quality rating Patient survey rating 12. [place] [phone] Quality rating Patient survey rating 13. [facility] [place] [phone] Quality rating Patient survey rating 14. [facility] [phone] Quality rating Not available4 Patient survey rating Not available9 15. [facility] [phone] Quality rating Patient survey rating 16. [place] [phone] Quality rating Patient survey rating 11 17. [place] [phone] Quality rating Patient survey rating 18. [place] [phone] Quality rating Patient survey  rating 19. [facility] [phone] Quality rating Patient survey rating 11 20. [place] [facility] [phone] Quality rating Patient survey rating 21. [facility] [place] [phone] Quality rating Patient survey rating 22. [place] [phone] Quality rating Patient survey rating To explore and download home health agen    Expected Discharge Plan: [place] Barriers to Discharge: Continued Medical Work up  Expected Discharge Plan and Services Expected Discharge Plan: [place]   Discharge Planning Services: CM Consult   Living arrangements for the past 2 months: Single Family Home                                       Social Determinants of Health (SDOH) Interventions    Readmission Risk Interventions No flowsheet data found.

## 2020-11-10 NOTE — Plan of Care (Signed)

## 2020-11-10 NOTE — Progress Notes (Signed)
CT scan showed massive small bowel dilatation and gastric distention.  Anastomosis decompressed.  Concern for possible pneumatosis along the duodenum and may be some portal venous gas.  Very atypical location since that usually has great blood supply.  Do not know if she had some type of embolic event with some ischemia.  Not a good area for surgical resection.  She does not have any free air / perforation at this time so hold off on any operative exploration.  The anastomosis shows no evidence of any abscess or leak at the ileocolonic region the epigastric location.  NG tube  Bowel rest.  Rehydration as needed.  Start TNA for expected prolonged ileus.  IV antibiotics.  Add Eraxis for antifungal coverage for now since prolonged course, elderly & fragile.  Consider full anticoagulation since oral Eliquis not an option.  Start low-dose enoxaparin tonight and defer to primary service and cardiology if they feel this is needed.

## 2020-11-10 NOTE — Progress Notes (Signed)
Nutrition Follow-up  DOCUMENTATION CODES:   Not applicable  INTERVENTION:  - diet advancement as medically feasible. - will monitor for need to initiate TPN.    NUTRITION DIAGNOSIS:   Increased nutrient needs related to acute illness as evidenced by estimated needs. -ongoing  GOAL:   Patient will meet greater than or equal to 90% of their needs -unmet/unable to meet  MONITOR:   Diet advancement,Labs,Weight trends  ASSESSMENT:   86 y.o. female with medical history of A.Fib on Eliquis and HTN. She presented to the ED for evaluation of anemia and SOB which has been ongoing since 06/2020.  Significant Events: 5/11- admission 5/15- initial RD assessment 5/16- lap assisted R hemicolectomy for ascending colon mass (adenocarcinoma) and multiple polyps 5/18- NGT placement 5/19- diet changed from CLD to NPO 5/21- diet advanced to CLD; NGT removed 5/24- diet advanced to FLD and then to Soft 5/25- diet advanced to Heart Healthy   Patient began having N/V and abdominal distention and tautness last night and diet was changed back to NPO at [place]. She remains NPO at this time pending imaging and concern for repeat ileus.   Weight is 10 lb below lowest weight this admission although questions accuracy as weight today is 132 lb and yesterday it was 142 lb. Mild, generalized pitting edema documented on 5/24 and no documentation in edema section of flow sheet since that time.   Per Surgery note, patient with invasive moderately differentiated adenocarcinoma, all 21 lymph nodes negative for carcinoma.    Labs reviewed; K: 3.2 mmol/l, Mg: 1.6 mg/dl. Medications reviewed; 10 mg rectal dulcolax/day, 250 mg KPhos neutral BID, 625 mg fibercon BID, 10 mEq IV KCl x5 runs today.   Diet Order:   Diet Order            Diet NPO time specified Except for: Ice Chips, Sips with Meds  Diet effective now                 EDUCATION NEEDS:   No education needs have been identified at this  time  Skin:  Skin Assessment: Reviewed RN Assessment  Last BM:  5/26 (small amount of type 7 x1)  Height:   Ht Readings from Last 1 Encounters:  11/05/20 5\' 4"  (1.626 m)    Weight:   Wt Readings from Last 1 Encounters:  11/10/20 59.9 kg     Estimated Nutritional Needs:  Kcal:  1700-1900 kcal Protein:  80-95 grams Fluid:  >/= 2 L/day      Jarome Matin, MS, RD, LDN, CNSC Inpatient Clinical Dietitian RD pager # available in [facility]  After hours/weekend pager # available in [facility]

## 2020-11-10 NOTE — Progress Notes (Signed)
Patient HR sustaining 125-135. PRN lopressor given at 0111. PRN lopressor not due yet. Provider made aware. New order placed. Will continue to monitor closely.

## 2020-11-10 NOTE — Progress Notes (Addendum)
Occupational Therapy Treatment Patient Details Name: Robin [name] MRN: [id number] DOB: 01-09-1935 Today's Date: 11/10/2020    History of present illness 86 year old F presented to ED for anemia and shortness of breath, and admitted for blood loss anemia due to GI bleed.  Hgb 8.5 (reportedly 11 in January 2022 per daughter).  Hemoccult positive.  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 s/p laparoscopic hemicolectomy 10/31/20. Pt  with PMH of A. fib on Eliquis and HTN.   OT comments  Patient limited by nausea and vomiting and reports she is getting ready to go for a CT. Patient overall reports fatigue and weakness as well. Today patient only able to transfer to [facility] and return to bed. Patient vomited twice. Supervision for bed mobility and min guard for [facility] transfer. Hr up to 135 with transfer. Continues to exhibit decreased activity tolerance. Will continue to follow.   Follow Up Recommendations  Home health OT;Supervision - Intermittent    Equipment Recommendations  None recommended by OT    Recommendations for Other Services      Precautions / Restrictions Precautions Precautions: Other (comment) Precaution Comments: abdominal surgery Restrictions Weight Bearing Restrictions: No       Mobility Bed Mobility Overal bed mobility: Needs Assistance Bed Mobility: Supine to Sit;Sit to Supine     Supine to sit: Supervision Sit to supine: Supervision   General bed mobility comments: increased time.    Transfers Overall transfer level: Needs assistance Equipment used: None Transfers: Sit to/from [facility] Sit to Stand: Min guard Stand pivot transfers: Min guard       General transfer comment: HR up to 135 with pivot to [facility].    Balance Overall balance assessment: Mild deficits observed, not formally tested                                          ADL either performed or assessed with clinical judgement   ADL Overall ADL's : Needs assistance/impaired                         Toilet Transfer: Min Public librarian Details (indicate cue type and reason): Patient limited by fatigue and n/v - min guard to transfer to [place] Manipulation and Hygiene: Min guard;Sitting/lateral lean Toileting - Clothing Manipulation Details (indicate cue type and reason): min guard for toileting on [facility]             Vision Patient Visual Report: No change from baseline Vision Assessment?: No apparent visual deficits   Perception     Praxis      Cognition Arousal/Alertness: Awake/alert Behavior During Therapy: WFL for tasks assessed/performed Overall Cognitive Status: Within Functional Limits for tasks assessed                                          Exercises     Shoulder Instructions       General Comments      Pertinent Vitals/ Pain       Pain Assessment: No/denies pain  Home Living  Prior Functioning/Environment              Frequency  Min 2X/week        Progress Toward Goals  OT Goals(current goals can now be found in the care plan section)  Progress towards OT goals: Progressing toward goals  Acute Rehab OT Goals Patient Stated Goal: be able to eat OT Goal Formulation: With patient Time For Goal Achievement: 11/15/20 Potential to Achieve Goals: Good  Plan Discharge plan remains appropriate    Co-evaluation                 AM-PAC OT "6 Clicks" Daily Activity     Outcome Measure   Help from another person eating meals?: None Help from another person taking care of personal grooming?: None Help from another person toileting, which includes using toliet, bedpan, or urinal?: A Little Help from another person bathing (including washing, rinsing, drying)?: A Little Help from  another person to put on and taking off regular upper body clothing?: None Help from another person to put on and taking off regular lower body clothing?: A Little 6 Click Score: 21    End of Session    OT Visit Diagnosis: Pain;Other abnormalities of gait and mobility (R26.89);Unsteadiness on feet (R26.81)   Activity Tolerance Other (comment);Patient limited by fatigue (n/v)   Patient Left in bed;with call bell/phone within reach;with bed alarm set   Nurse Communication Mobility status        Time: 5003-7048 OT Time Calculation (min): 11 min  Charges: OT General Charges $OT Visit: 1 Visit OT Treatments $Self Care/Home Management : 8-22 mins  Derl Barrow, OTR/L [place]  Office [phone] Pager: [place] 11/10/2020, 12:08 PM

## 2020-11-10 NOTE — Progress Notes (Signed)
PROGRESS NOTE    Robin [name]  QMV:[id number] DOB: Jan 12, 1935 DOA: 10/26/2020 PCP: [name], PA-C   No chief complaint on file.  Brief Narrative:  86 year old F with PMH of A. fib on Eliquis and HTN presented to ED for anemia and shortness of breath, and admitted for blood loss anemia due to GI bleed.  Hgb 8.5 (reportedly 11 in January 2022 per daughter).  Hemoccult positive.  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  Colonoscopy on 5/13 confirmed malignant tumor adjacent to the IC valve. Pathology from proximal mass with invasive adenocarcinoma.  Pathology of polyp from transverse colon and splenic flexure with at least high-grade dysplasia. MRI abdomen with and without contrast without significant finding within the visualized portion of the abdomen. Patient underwent laparoscopic proximal hemicolectomy with reanastomosis on 5/16.  Post op treatment now per surgery, complicated by ileus, slowly improving.  Assessment & Plan:   Principal Problem:   Cecal cancer & polyps s/p lap hemicolectomy 10/31/2020 Active Problems:   Paroxysmal atrial fibrillation (HCC)   Chronic anticoagulation   Anemia   GI bleed   Colonic mass   High grade dysplasia in colonic adenomas transverse colon & splenic flexure   Colon cancer (HCC)   Hypophosphatemia  Adenocarcinoma of proximal colon with ileocecal adenopathy: Noted on CT abdomen and pelvis and colonoscopy. MRI as above.  Pathology from proximal mass with invasive adenocarcinoma.  Pathology of polyp from transverse colon and splenic flexure with at least high-grade dysplasia.  -S/p laparoscopic proximal hemicolectomy with reanastomosis by Dr. Johney Maine on 5/16 -General surgery managing -Follow surgical pathology -> invasive moderately differentiated adenocarcinoma with mucinous features, tumor invades muscularis propia, margins of resction not involved, 21 LN's  negative for carcinoma.  - Dr. Johney Maine planning for patient to follow up with tumor board - Will need outpatient oncology follow up  Patient had fever of 101.4 early this morning with tachycardia and now rising leukocytosis as well as worsening abdominal pain.  Abdominal x-ray shows distal small bowel obstruction.  She has been seen by general surgery and they have ordered repeat CAT scan.  I have ordered repeat blood culture as well.  We will see how her CBC looks tomorrow morning and what CT scan shows today.  Hold off antibiotics until solid evidence of infection.  Nausea  Vomiting  Ileus  - plain film with dilated small bowel loobs concerning for distal small bowel obstruction or ileus - NGT d/c'd 5/21, diet was advanced by general surgery yesterday and now she is n.p.o. again due to worsening abdominal pain.  Diarrhea - negative c diff studies.  Imodium held.  Severe iron deficiency anemia due to acute on chronic blood loss from GI bleed: FOBT positive.  Hgb 13.7 in 2019, and reportedly about 12 in January 2022 per daughter. - Hb relatively stable today, follow - labs c/w iron def anemia, low normal B12 (follow MMA - pending)  Paroxysmal A. Fib with RVR: On Toprol-XL, Cardizem and Eliquis at home.  CHA2DS2-VASc score 4 (age, sex, HTN) -rate well controlled today -continue home dilt and metop and Eliquis.  Essential hypertension: Normotensive. -Continue metop and diltiazide  -Continue holding Maxzide for now  Hypokalemia/hypophosphatemia/Hypomagnesemia: Phosphate now normal but magnesium and potassium are low and we will replet dose.  Hyperglycemia -Hemoglobin A1c within normal range.  Leukocytosis Worsening, as mentioned above.  DVT prophylaxis: Eliquis Code Status: full Family Communication: None present at bedside. Disposition:   Status is:  Inpatient  Remains inpatient appropriate because:Inpatient level of care appropriate due to severity of illness   Dispo: The  patient is from: Home              Anticipated d/c is to: Home              Patient currently is not medically stable to d/c.   Difficult to place patient No       Consultants:   Surgery  GI  Procedures:  5/16 PROCEDURE:   LAPAROSCOPIC PROXIMAL HEMICOLECTOMY  OMENTOPEXY OF ANASTOMOSIS LAPAROSCOPIC LYSIS OF ADHESIONS TRANSVERSUS ABDOMINIS PLANE (TAP) BLOCK - BILATERAL  Colonoscopy - Obvious malignant tumor adjacent to the IC valve. Biopsied. - Several polyps were also removed (mixed snare cautery and cold snare). The largest was located at the presumed splenic flexure, removed piecemeal, and although it was soft and not overtly malignant I labled the site with submucosal Spot injections given it's size (2.5cm). - Return patient to hospital ward for ongoing care. - Will need staging tests with CT scan chest, abd, pelvis. CEA. - I recommend continuing to hold eliquis for now. Await pathology results. - General surgery has already been consulted. - [facility] will return to see her on Monday. Drs. Collene Mares and Benson Norway are covering this weekend if needed.  Antimicrobials: Anti-infectives (From admission, onward)   Start     Dose/Rate Route Frequency Ordered Stop   11/08/20 0045  vancomycin (VANCOCIN) 50 mg/mL oral solution 125 mg  Status:  Discontinued        125 mg Oral Every 6 hours 11/07/20 2354 11/08/20 1304   10/31/20 2200  cefoTEtan (CEFOTAN) 2 g in sodium chloride 0.9 % 100 mL IVPB        2 g 200 mL/hr over 30 Minutes Intravenous Every 12 hours 10/31/20 1744 10/31/20 2205   10/31/20 0915  cefoTEtan (CEFOTAN) 2 g in sodium chloride 0.9 % 100 mL IVPB        2 g 200 mL/hr over 30 Minutes Intravenous On call to O.R. 10/31/20 0824 10/31/20 1312        Subjective: Patient seen and examined.  She complains of worsening abdominal pain passing some flatus but no BM.   Objective: Vitals:   11/10/20 0400 11/10/20 0646 11/10/20 0800 11/10/20 1204  BP: (!) 142/88  112/72 132/71   Pulse: (!) 126  (!) 118 (!) 116  Resp: 19  20 (!) 24  Temp: (!) 101.4 F (38.6 C) 99.6 F (37.6 C) 98.1 F (36.7 C) 97.6 F (36.4 C)  TempSrc: Axillary Axillary Oral Oral  SpO2: 94%  94% 95%  Weight: 59.9 kg     Height:        Intake/Output Summary (Last 24 hours) at 11/10/2020 1310 Last data filed at 11/10/2020 0100 Gross per 24 hour  Intake 0 ml  Output 200 ml  Net -200 ml   Filed Weights   11/08/20 0500 11/09/20 0500 11/10/20 0400  Weight: 65.2 kg 64.4 kg 59.9 kg    Examination:  General exam: Appears calm and comfortable  Respiratory system: Clear to auscultation. Respiratory effort normal. Cardiovascular system: S1 & S2 heard, RRR. No JVD, murmurs, rubs, gallops or clicks. No pedal edema. Gastrointestinal system: Abdomen is slightly distended, soft and mild periumbilical tenderness, no organomegaly or masses felt. Normal bowel sounds heard. Central nervous system: Alert and oriented. No focal neurological deficits. Extremities: Symmetric 5 x 5 power. Skin: No rashes, lesions or ulcers.  Psychiatry: Judgement and insight appear normal.  Mood & affect appropriate.    Data Reviewed: I have personally reviewed following labs and imaging studies  CBC: Recent Labs  Lab 11/04/20 0425 11/05/20 0527 11/06/20 0521 11/07/20 0508 11/08/20 0427 11/09/20 0652 11/10/20 0438  WBC 11.6* 10.9* 17.9* 16.4* 14.0* 12.6* 23.0*  NEUTROABS 10.1* 9.2*  --   --  11.7* 10.4* 21.7*  HGB 9.9* 10.0* 10.1* 10.4* 11.1* 11.2* 12.0  HCT 33.6* 33.9* 34.0* 35.2* 38.9 38.2 40.3  MCV 79.8* 79.4* 80.0 81.1 82.8 80.9 79.3*  PLT 281 304 300 286 370 402* 455*    Basic Metabolic Panel: Recent Labs  Lab 11/06/20 0521 11/07/20 0508 11/08/20 0427 11/09/20 0447 11/10/20 0438  NA 137 137 135 137 136  K 3.4* 3.6 3.1* 3.6 3.2*  CL 105 104 103 106 100  CO2 26 28 24 25 27   GLUCOSE 154* 175* 98 115* 132*  BUN 16 18 22 20 22   CREATININE 0.44 0.52 0.56 0.45 0.56  CALCIUM 8.9 8.7* 8.9 8.9 9.3   MG 1.8 1.7 1.9 1.8 1.6*  PHOS 2.3* 2.1* 2.5 2.0* 2.8    GFR: Estimated Creatinine Clearance: 46 mL/min (by C-G formula based on SCr of 0.56 mg/dL).  Liver Function Tests: Recent Labs  Lab 11/05/20 0527 11/06/20 0521 11/07/20 0508 11/08/20 0427 11/09/20 0447  AST 15 15 24 18  12*  ALT 20 23 30 28 21   ALKPHOS 95 114 105 120 105  BILITOT 1.5* 1.7* 1.3* 1.7* 1.1  PROT 5.4* 5.4* 5.2* 5.6* 5.2*  ALBUMIN 2.7* 2.7* 2.5* 2.7* 2.6*    CBG: No results for input(s): GLUCAP in the last 168 hours.   Recent Results (from the past 240 hour(s))  C Difficile Quick Screen (NO PCR Reflex)     Status: None   Collection Time: 11/07/20  2:01 PM   Specimen: STOOL  Result Value Ref Range Status   C Diff antigen NEGATIVE NEGATIVE Final   C Diff toxin NEGATIVE NEGATIVE Final   C Diff interpretation No C. difficile detected.  Final    Comment: Performed at [facility], [place] [address], [place], [place] [address]         Radiology Studies: DG Abd 2 Views  Result Date: 11/10/2020 CLINICAL DATA:  Vomiting, abdominal distension EXAM: ABDOMEN - 2 VIEW COMPARISON:  None. FINDINGS: Two view radiograph the abdomen demonstrates multiple gas-filled dilated loops of small-bowel throughout the abdomen demonstrating air-fluid levels at multiple levels. There is a relative paucity of gas within the colon. Together the findings are in keeping with a distal small bowel obstruction. No free intraperitoneal gas. Cholecystectomy clips seen in the right upper quadrant. IMPRESSION: Distal small bowel obstruction. Electronically Signed   By: Fidela Salisbury MD   On: 11/10/2020 01:15        Scheduled Meds: . apixaban  5 mg Oral BID  . Barium Sulfate  450 mL Oral BID  . bisacodyl  10 mg Rectal Daily  . diltiazem  180 mg Oral Daily  . feeding supplement  1 Container Oral TID BM  . lip balm  1 application Topical BID  . liver oil-zinc oxide   Topical BID  . metoprolol tartrate  50 mg Oral  BID  . phosphorus  250 mg Oral BID  . polycarbophil  625 mg Oral BID  . simethicone  80 mg Oral QID   Continuous Infusions: . magnesium sulfate bolus IVPB    . methocarbamol (ROBAXIN) IV    . potassium chloride 10 mEq (11/10/20 1218)  LOS: 14 days    Time spent: 30 minutes  Darliss Cheney, MD [facility]   To contact the attending provider between 7A-7P or the covering provider during after hours 7P-7A, please log into the web site [email or web address] and access using universal [place] password for that web site. If you do not have the password, please call the hospital operator.  11/10/2020, 1:10 PM

## 2020-11-10 NOTE — Progress Notes (Signed)
[facility] Progress Note  10 Days Post-Op  Subjective: CC-  Patient reports worsening abdominal pain, distension, and n/v over night. Diarrhea stopped last night. She is still passing a little flatus this morning. WBC up 23  Objective: Vital signs in last 24 hours: Temp:  [98 F (36.7 C)-101.4 F (38.6 C)] 99.6 F (37.6 C) (05/26 0646) Pulse Rate:  [89-140] 126 (05/26 0400) Resp:  [16-24] 19 (05/26 0400) BP: (133-155)/(69-103) 142/88 (05/26 0400) SpO2:  [94 %-100 %] 94 % (05/26 0400) Weight:  [59.9 kg] 59.9 kg (05/26 0400) Last BM Date: 11/09/20  Intake/Output from previous day: 05/25 0701 - 05/26 0700 In: 240 [P.O.:240] Out: 200 [Emesis/NG output:200] Intake/Output this shift: No intake/output data recorded.  PE: Gen: Alert, NAD, pleasant Cardio: tachy 120s Pulm: rate and effort normal on room air VHQ:IONG, distended, minimal diffuse TTP, incisions cdi without erythema or drainage  Lab Results:  Recent Labs    11/09/20 0652 11/10/20 0438  WBC 12.6* 23.0*  HGB 11.2* 12.0  HCT 38.2 40.3  PLT 402* 455*   BMET Recent Labs    11/09/20 0447 11/10/20 0438  NA 137 136  K 3.6 3.2*  CL 106 100  CO2 25 27  GLUCOSE 115* 132*  BUN 20 22  CREATININE 0.45 0.56  CALCIUM 8.9 9.3   PT/INR No results for input(s): LABPROT, INR in the last 72 hours. CMP     Component Value Date/Time   NA 136 11/10/2020 0438   NA 139 02/21/2018 0854   K 3.2 (L) 11/10/2020 0438   CL 100 11/10/2020 0438   CO2 27 11/10/2020 0438   GLUCOSE 132 (H) 11/10/2020 0438   BUN 22 11/10/2020 0438   BUN 20 02/21/2018 0854   CREATININE 0.56 11/10/2020 0438   CREATININE 0.54 (L) 11/28/2015 1422   CALCIUM 9.3 11/10/2020 0438   PROT 5.2 (L) 11/09/2020 0447   ALBUMIN 2.6 (L) 11/09/2020 0447   AST 12 (L) 11/09/2020 0447   ALT 21 11/09/2020 0447   ALKPHOS 105 11/09/2020 0447   BILITOT 1.1 11/09/2020 0447   GFRNONAA >60 11/10/2020 0438   GFRAA 78 02/21/2018 0854   Lipase      Component Value Date/Time   LIPASE 31 10/26/2020 1919       Studies/Results: DG Abd 2 Views  Result Date: 11/10/2020 CLINICAL DATA:  Vomiting, abdominal distension EXAM: ABDOMEN - 2 VIEW COMPARISON:  None. FINDINGS: Two view radiograph the abdomen demonstrates multiple gas-filled dilated loops of small-bowel throughout the abdomen demonstrating air-fluid levels at multiple levels. There is a relative paucity of gas within the colon. Together the findings are in keeping with a distal small bowel obstruction. No free intraperitoneal gas. Cholecystectomy clips seen in the right upper quadrant. IMPRESSION: Distal small bowel obstruction. Electronically Signed   By: Fidela Salisbury MD   On: 11/10/2020 01:15    Anti-infectives: Anti-infectives (From admission, onward)   Start     Dose/Rate Route Frequency Ordered Stop   11/08/20 0045  vancomycin (VANCOCIN) 50 mg/mL oral solution 125 mg  Status:  Discontinued        125 mg Oral Every 6 hours 11/07/20 2354 11/08/20 1304   10/31/20 2200  cefoTEtan (CEFOTAN) 2 g in sodium chloride 0.9 % 100 mL IVPB        2 g 200 mL/hr over 30 Minutes Intravenous Every 12 hours 10/31/20 1744 10/31/20 2205   10/31/20 0915  cefoTEtan (CEFOTAN) 2 g in sodium chloride 0.9 % 100 mL IVPB  2 g 200 mL/hr over 30 Minutes Intravenous On call to O.R. 10/31/20 0824 10/31/20 1312       Assessment/Plan Atrial fibrillation- on toprol, cardizem, eliquis HTN HLD Diarrhea - c diff negative 5/23. Was improving then stopped over night, plan as below --per [facility]--  POD#10-s/p lap assisted right hemicolectomy for ascending colon mass (adenocarcinoma) and multiple polyps (likely pre-malignant), Dr. Johney Maine 10/31/20 - surgical path:Invasive moderately differentiated adenocarcinoma with mucinous  features, 4 cm; tumor invades the muscularis propria; margins of resection are not involved; twenty-one lymph nodes, negative for carcinoma (0/21). - With n/v and worsening  leukocytosis will order CT scan today, try PO contrast but do not force if making n/v worse. Continue therapies and encourage mobilization. Replete K and check magnesium level.  FEN -NPO VTE -eliquis ID -Cefotetanpre-op, PO vancomycin 5/24>>5/24   LOS: 14 days    Wellington [place], [facility] 11/10/2020, 8:13 AM Please see Amion for pager number during day hours 7:00am-4:30pm

## 2020-11-10 NOTE — Progress Notes (Addendum)
Patient with emesis x2. Last episode patient vomited 200 cc's of bile colored emesis. Patient's abdomen is distended and taut. Provider on call notified. New orders placed. Hospitalist on call and surgeon on call  made aware of results of x-ray. Patient now NPO. Patient's heart rate also sustaining 130-140, making patient red mews. Provider also made aware. Will continue to monitor closely.

## 2020-11-10 NOTE — TOC Progression Note (Signed)
Transition of Care [facility]) - Progression Note    Patient Details  Name: Robin [name] MRN: [id number] Date of Birth: [birth year]  Transition of [facility]) CM/SW Contact  Fredy Gladu, Juliann Pulse, RN Phone Number: 11/10/2020, 9:50 AM  Clinical Narrative:  HHPT/HHOT ordered- patient was not recc for HHPT, & HHOT is not a covered skill alone for insurance to cover.[facility]N will f/u in community      Expected Discharge Plan: [place] Barriers to Discharge: Continued Medical Work up  Expected Discharge Plan and Services Expected Discharge Plan: [place]   Discharge Planning Services: CM Consult   Living arrangements for the past 2 months: Single Family Home                                       Social Determinants of Health (SDOH) Interventions    Readmission Risk Interventions No flowsheet data found.

## 2020-11-11 ENCOUNTER — Inpatient Hospital Stay (HOSPITAL_COMMUNITY): Payer: Medicare PPO

## 2020-11-11 DIAGNOSIS — E43 Unspecified severe protein-calorie malnutrition: Secondary | ICD-10-CM

## 2020-11-11 DIAGNOSIS — C180 Malignant neoplasm of cecum: Secondary | ICD-10-CM | POA: Diagnosis not present

## 2020-11-11 LAB — CBC
HCT: 37.8 % (ref 36.0–46.0)
Hemoglobin: 11.4 g/dL — ABNORMAL LOW (ref 12.0–15.0)
MCH: 23.9 pg — ABNORMAL LOW (ref 26.0–34.0)
MCHC: 30.2 g/dL (ref 30.0–36.0)
MCV: 79.4 fL — ABNORMAL LOW (ref 80.0–100.0)
Platelets: 441 10*3/uL — ABNORMAL HIGH (ref 150–400)
RBC: 4.76 MIL/uL (ref 3.87–5.11)
RDW: 25.4 % — ABNORMAL HIGH (ref 11.5–15.5)
WBC: 17 10*3/uL — ABNORMAL HIGH (ref 4.0–10.5)
nRBC: 0 % (ref 0.0–0.2)

## 2020-11-11 LAB — HEPARIN LEVEL (UNFRACTIONATED): Heparin Unfractionated: >1.1 IU/mL — ABNORMAL HIGH (ref 0.30–0.70)

## 2020-11-11 LAB — GLUCOSE, CAPILLARY
Glucose-Capillary: 95 mg/dL (ref 70–99)
Glucose-Capillary: 105 mg/dL — ABNORMAL HIGH (ref 70–99)
Glucose-Capillary: 128 mg/dL — ABNORMAL HIGH (ref 70–99)

## 2020-11-11 LAB — PHOSPHORUS: Phosphorus: 3.4 mg/dL (ref 2.5–4.6)

## 2020-11-11 LAB — DIFFERENTIAL
Abs Immature Granulocytes: 0.09 10*3/uL — ABNORMAL HIGH (ref 0.00–0.07)
Basophils Absolute: 0 10*3/uL (ref 0.0–0.1)
Basophils Relative: 0 %
Eosinophils Absolute: 0 10*3/uL (ref 0.0–0.5)
Eosinophils Relative: 0 %
Immature Granulocytes: 1 %
Lymphocytes Relative: 4 %
Lymphs Abs: 0.7 10*3/uL (ref 0.7–4.0)
Monocytes Absolute: 0.5 10*3/uL (ref 0.1–1.0)
Monocytes Relative: 3 %
Neutro Abs: 15.7 10*3/uL — ABNORMAL HIGH (ref 1.7–7.7)
Neutrophils Relative %: 92 %

## 2020-11-11 LAB — COMPREHENSIVE METABOLIC PANEL
ALT: 17 U/L (ref 0–44)
AST: 13 U/L — ABNORMAL LOW (ref 15–41)
Albumin: 2.6 g/dL — ABNORMAL LOW (ref 3.5–5.0)
Alkaline Phosphatase: 101 U/L (ref 38–126)
Anion gap: 8 (ref 5–15)
BUN: 37 mg/dL — ABNORMAL HIGH (ref 8–23)
CO2: 27 mmol/L (ref 22–32)
Calcium: 9.1 mg/dL (ref 8.9–10.3)
Chloride: 101 mmol/L (ref 98–111)
Creatinine, Ser: 0.86 mg/dL (ref 0.44–1.00)
GFR, Estimated: >60 mL/min (ref >60)
Glucose, Bld: 125 mg/dL — ABNORMAL HIGH (ref 70–99)
Potassium: 3.7 mmol/L (ref 3.5–5.1)
Sodium: 136 mmol/L (ref 135–145)
Total Bilirubin: 1.9 mg/dL — ABNORMAL HIGH (ref 0.3–1.2)
Total Protein: 5.6 g/dL — ABNORMAL LOW (ref 6.5–8.1)

## 2020-11-11 LAB — MAGNESIUM: Magnesium: 2.1 mg/dL (ref 1.7–2.4)

## 2020-11-11 LAB — PREALBUMIN: Prealbumin: 5.7 mg/dL — ABNORMAL LOW (ref 18–38)

## 2020-11-11 LAB — APTT
aPTT: 35 seconds (ref 24–36)
aPTT: 35 seconds (ref 24–36)

## 2020-11-11 LAB — TRIGLYCERIDES: Triglycerides: 62 mg/dL (ref <150)

## 2020-11-11 MED ORDER — PANTOPRAZOLE SODIUM 40 MG IV SOLR
40.0000 mg | Freq: Two times a day (BID) | INTRAVENOUS | Status: DC
  Administered 2020-11-11 – 2020-11-14 (×8): 40 mg via INTRAVENOUS
  Filled 2020-11-11 (×6): qty 40

## 2020-11-11 MED ORDER — HEPARIN (PORCINE) 25000 UT/250ML-% IV SOLN
850.0000 [IU]/h | INTRAVENOUS | Status: DC
  Administered 2020-11-11: 850 [IU]/h via INTRAVENOUS
  Filled 2020-11-11: qty 250

## 2020-11-11 MED ORDER — TRAVASOL 10 % IV SOLN
INTRAVENOUS | Status: AC
  Filled 2020-11-11: qty 360

## 2020-11-11 MED ORDER — POTASSIUM CHLORIDE 10 MEQ/100ML IV SOLN
10.0000 meq | INTRAVENOUS | Status: AC
  Administered 2020-11-11 (×3): 10 meq via INTRAVENOUS
  Filled 2020-11-11 (×3): qty 100

## 2020-11-11 MED ORDER — HEPARIN (PORCINE) 25000 UT/250ML-% IV SOLN
850.0000 [IU]/h | INTRAVENOUS | Status: DC
  Filled 2020-11-11: qty 250

## 2020-11-11 MED ORDER — CHLORHEXIDINE GLUCONATE CLOTH 2 % EX PADS
6.0000 | MEDICATED_PAD | Freq: Every day | CUTANEOUS | Status: DC
  Administered 2020-11-12 – 2020-11-28 (×17): 6 via TOPICAL

## 2020-11-11 MED ORDER — SODIUM CHLORIDE 0.9% FLUSH
10.0000 mL | Freq: Two times a day (BID) | INTRAVENOUS | Status: DC
  Administered 2020-11-13 – 2020-11-25 (×6): 10 mL

## 2020-11-11 MED ORDER — SODIUM CHLORIDE 0.9% FLUSH
10.0000 mL | INTRAVENOUS | Status: DC | PRN
  Administered 2020-11-14 – 2020-11-28 (×4): 10 mL

## 2020-11-11 MED ORDER — INSULIN ASPART 100 UNIT/ML IJ SOLN
0.0000 [IU] | Freq: Three times a day (TID) | INTRAMUSCULAR | Status: DC
  Administered 2020-11-11: 1 [IU] via SUBCUTANEOUS
  Administered 2020-11-12: 2 [IU] via SUBCUTANEOUS
  Administered 2020-11-12: 1 [IU] via SUBCUTANEOUS
  Administered 2020-11-13: 2 [IU] via SUBCUTANEOUS

## 2020-11-11 MED ORDER — HEPARIN BOLUS VIA INFUSION
1000.0000 [IU] | Freq: Once | INTRAVENOUS | Status: AC
  Administered 2020-11-11: 1000 [IU] via INTRAVENOUS
  Filled 2020-11-11: qty 1000

## 2020-11-11 MED ORDER — TRAVASOL 10 % IV SOLN: INTRAVENOUS | Status: DC

## 2020-11-11 NOTE — Plan of Care (Signed)
  Problem: Coping: Goal: Level of anxiety will decrease Outcome: Adequate for Discharge   

## 2020-11-11 NOTE — Progress Notes (Signed)
Physical Therapy Treatment Patient Details Name: Robin [name] MRN: [id number] DOB: 1935/03/15 Today's Date: 11/11/2020    History of Present Illness 86 year old F presented to ED for anemia and shortness of breath, and admitted for blood loss anemia due to GI bleed.  Hgb 8.5 (reportedly 11 in January 2022 per daughter).  Hemoccult positive.  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 s/p laparoscopic hemicolectomy 10/31/20. Pt  with PMH of A. fib on Eliquis and HTN.    PT Comments    Pt with PICC line placement this morning and to start TPN.  Pt initially wanting to rest however agreeable with encouragement.  Pt mobilizing min/guard assist however updated d/c recommendations to HHPT due to long LOS, and pt appears to be on a plateau now in regards to mobility.   Follow Up Recommendations  Home health PT     Equipment Recommendations  3in1 (PT)    Recommendations for Other Services       Precautions / Restrictions Precautions Precautions: Other (comment) Precaution Comments: abdominal surgery    Mobility  Bed Mobility Overal bed mobility: Needs Assistance Bed Mobility: Supine to Sit;Sit to Supine     Supine to sit: Supervision Sit to supine: Supervision   General bed mobility comments: increased time.    Transfers Overall transfer level: Needs assistance Equipment used: Rolling walker (2 wheeled) Transfers: Sit to/from [facility] Sit to Stand: Min guard Stand pivot transfers: Min guard       General transfer comment: pt requested using BSC prior to ambulating  Ambulation/Gait Ambulation/Gait assistance: Min guard Gait Distance (Feet): 230 Feet Assistive device: Rolling walker (2 wheeled) Gait Pattern/deviations: Step-through pattern;Decreased stride length Gait velocity: decreased   General Gait Details: cues for posture, and distance from RW,  HR elevated  to 119 bpm   Stairs             Wheelchair Mobility    Modified Rankin (Stroke Patients Only)       Balance                                            Cognition Arousal/Alertness: Awake/alert Behavior During Therapy: WFL for tasks assessed/performed Overall Cognitive Status: Within Functional Limits for tasks assessed                                        Exercises      General Comments        Pertinent Vitals/Pain Pain Assessment: Faces Faces Pain Scale: Hurts a little bit Pain Location: abdomen Pain Descriptors / Indicators: Sore;Aching Pain Intervention(s): Repositioned;Monitored during session    Home Living                      Prior Function            PT Goals (current goals can now be found in the care plan section) Acute Rehab PT Goals PT Goal Formulation: With patient Time For Goal Achievement: 11/25/20 Potential to Achieve Goals: Good Progress towards PT goals: Progressing toward goals    Frequency    Min 3X/week      PT Plan Discharge plan needs to be updated    Co-evaluation  AM-PAC PT "6 Clicks" Mobility   Outcome Measure  Help needed turning from your back to your side while in a flat bed without using bedrails?: A Little Help needed moving from lying on your back to sitting on the side of a flat bed without using bedrails?: A Little Help needed moving to and from a bed to a chair (including a wheelchair)?: A Little Help needed standing up from a chair using your arms (e.g., wheelchair or bedside chair)?: A Little Help needed to walk in hospital room?: A Little Help needed climbing 3-5 steps with a railing? : A Little 6 Click Score: 18    End of Session Equipment Utilized During Treatment: Gait belt Activity Tolerance: Patient tolerated treatment well Patient left: with call bell/phone within reach;in bed;with bed alarm set Nurse Communication: Mobility  status PT Visit Diagnosis: Difficulty in walking, not elsewhere classified (R26.2)     Time: 8721-5872 PT Time Calculation (min) (ACUTE ONLY): 19 min  Charges:  $Gait Training: 8-22 mins                    Arlyce Dice, DPT Acute Rehabilitation Services Pager: [phone] Office: [phone]  York Ram E 11/11/2020, 3:36 PM

## 2020-11-11 NOTE — Progress Notes (Signed)
This RN agrees with the previous RN's assessment.

## 2020-11-11 NOTE — Progress Notes (Signed)
[place] for IV heparin Indication: atrial fibrillation  Allergies  Allergen Reactions  . Iodinated Diagnostic Agents Shortness Of Breath  . Amlodipine     Shortness of breath  . Codeine Nausea Only  . Diphenhydramine Other (See Comments)    hypertension  . Diphenhydramine Hcl     Hypertension   . Pravastatin     Muscle aches  . Zetia [Ezetimibe]   . Sulfa Antibiotics Rash  . Sulfa Drugs Cross Reactors Rash    Patient Measurements: Height: 5\' 4"  (162.6 cm) Weight: 62.1 kg (136 lb 14.5 oz) IBW/kg (Calculated) : 54.7 Heparin Dosing Weight: TBW  Vital Signs: Temp: 97.5 F (36.4 C) (05/27 0617) Temp Source: Oral (05/27 0617) BP: 112/54 (05/27 0617) Pulse Rate: 105 (05/27 0617)  Labs: Recent Labs    11/09/20 0447 11/09/20 5993 11/09/20 0652 11/10/20 0438 11/11/20 0529  HGB  --  11.2*   < > 12.0 11.4*  HCT  --  38.2  --  40.3 37.8  PLT  --  402*  --  455* 441*  CREATININE 0.45  --   --  0.56 0.86   < > = values in this interval not displayed.    Estimated Creatinine Clearance: 42.8 mL/min (by C-G formula based on SCr of 0.86 mg/dL).   Medical History: Past Medical History:  Diagnosis Date  . Anxiety   . AV bloc first degree   . Carotid stenosis 03/02/2017  . Hyperlipidemia   . Hypertension   . Lumbar pain    fell off of her horse and has low back pain at times  . Venous insufficiency of both lower extremities   . Vertigo     Medications:  Medications Prior to Admission  Medication Sig Dispense Refill Last Dose  . atorvastatin (LIPITOR) 10 MG tablet Take 0.5 tablets by mouth 3 (three) times a week.   10/24/2020 at unknown time  . Cholecalciferol (VITAMIN D) 50 MCG (2000 UT) tablet Take 2,000 Units by mouth at bedtime.   10/25/2020 at Unknown time  . diphenoxylate-atropine (LOMOTIL) 2.5-0.025 MG per tablet Take 1 tablet by mouth daily as needed (for diarrhea).    10/26/2020 at Unknown time  . ELIQUIS 5 MG TABS tablet  TAKE 1 TABLET BY MOUTH TWICE DAILY. 180 tablet 1 10/26/2020 at 0800  . estradiol (ESTRACE) 1 MG tablet Take 0.5 mg by mouth daily.   10/26/2020 at Unknown time  . Lactobacillus Rhamnosus, GG, (CULTURELLE PO) Take 1 capsule by mouth every morning.   10/26/2020 at Unknown time  . Lutein 20 MG CAPS Take 1 capsule by mouth every evening.   10/25/2020 at Unknown time  . metoprolol succinate (TOPROL-XL) 50 MG 24 hr tablet Take 50 mg by mouth 2 (two) times daily.   10/26/2020 at 0800  . potassium chloride SA (KLOR-CON) 20 MEQ tablet TAKE 1 TABLET BY MOUTH TWICE DAILY. (Patient taking differently: Take 20 mEq by mouth daily.) 180 tablet 2 10/26/2020 at Unknown time  . TIADYLT ER 180 MG 24 hr capsule TAKE (1) CAPSULE DAILY. (Patient taking differently: Take 180 mg by mouth daily.) 90 capsule 2 10/26/2020 at Unknown time  . tiZANidine (ZANAFLEX) 2 MG tablet Take 2 mg by mouth every 8 (eight) hours as needed for muscle spasms.   unknown  . triamcinolone cream (KENALOG) 0.1 % Apply 1 application topically daily as needed (for rash).    unknown  . triamterene-hydrochlorothiazide (MAXZIDE-25) 37.5-25 MG tablet Take 1 tablet by mouth daily.  10/26/2020 at Unknown time  . vitamin A 10000 UNIT capsule Take 10,000 Units by mouth daily.   10/26/2020 at Unknown time   Scheduled:  . enoxaparin (LOVENOX) injection  40 mg Subcutaneous QHS  . feeding supplement  1 Container Oral TID BM  . insulin aspart  0-9 Units Subcutaneous Q8H  . lip balm  1 application Topical BID  . liver oil-zinc oxide   Topical BID  . metoprolol tartrate  5 mg Intravenous Q6H  . pantoprazole (PROTONIX) IV  40 mg Intravenous Q12H   Assessment: 39 yoF with PMH Afib on Eliquis, s/p hemicolectomy 5/16 for colon adenocarcinoma; now with severe SBO and possible need for imminent surgical intervention. Pharmacy consulted to dose IV heparin infusion.   Baseline INR, aPTT: pending  Prior anticoagulation: Eliquis 5 mg BID; LD 5/26 AM; did get 1 dose LMWH  40 mg 5/26 PM after Eliquis held  Significant events:  Today, 11/11/2020:  CBC: Hgb slightly low but stable with prior labs this admission; Plt elevated  SCr stable WNL  No bleeding or infusion issues per nursing  Goal of Therapy: Heparin level 0.3-0.7 units/ml Monitor platelets by anticoagulation protocol: Yes  Plan:  Heparin 850 units/hr IV infusion; no bolus needed in context of Afib  Check aPTT hrs after start  Daily CBC, daily heparin level once stable  Monitor for signs of bleeding or thrombosis  Reuel Boom, PharmD, BCPS [phone] 11/11/2020, 8:54 AM

## 2020-11-11 NOTE — Progress Notes (Signed)
Peripherally Inserted Central Catheter Placement  The IV Nurse has discussed with the patient and/or persons authorized to consent for the patient, the purpose of this procedure and the potential benefits and risks involved with this procedure.  The benefits include less needle sticks, lab draws from the catheter, and the patient may be discharged home with the catheter. Risks include, but not limited to, infection, bleeding, blood clot (thrombus formation), and puncture of an artery; nerve damage and irregular heartbeat and possibility to perform a PICC exchange if needed/ordered by physician.  Alternatives to this procedure were also discussed.  Bard Power PICC patient education guide, fact sheet on infection prevention and patient information card has been provided to patient /or left at bedside.    PICC Placement Documentation  PICC Double Lumen 11/11/20 PICC Right Brachial 37 cm 0 cm (Active)  Indication for Insertion or Continuance of Line Administration of hyperosmolar/irritating solutions (i.e. TPN, Vancomycin, etc.) 11/11/20 1008  Exposed Catheter (cm) 0 cm 11/11/20 1008  Site Assessment Clean;Dry;Intact 11/11/20 1008  Lumen #1 Status Flushed;Blood return noted;Saline locked 11/11/20 1008  Lumen #2 Status Flushed;Blood return noted;Saline locked 11/11/20 1008  Dressing Type Transparent 11/11/20 1008  Dressing Status Clean;Dry;Intact 11/11/20 1008  Antimicrobial disc in place? Yes 11/11/20 1008  Dressing Change Due 11/18/20 11/11/20 1008       Robin [name] 11/11/2020, 10:09 AM

## 2020-11-11 NOTE — Progress Notes (Signed)
[facility] Progress Note  11 Days Post-Op  Subjective: CC-  Feeling much better today. Denies any abdominal pain. Still has a little nausea at times, no emesis. Passing flatus and has had a few loose stools this morning.  Denies CP or SOB. States that she has had a mild, nonproductive cough.  WBC down 17, TMAX 100.7.   Objective: Vital signs in last 24 hours: Temp:  [97.5 F (36.4 C)-100.7 F (38.2 C)] 97.5 F (36.4 C) (05/27 0617) Pulse Rate:  [68-140] 105 (05/27 0617) Resp:  [16-27] 20 (05/27 0617) BP: (112-147)/(54-83) 112/54 (05/27 0617) SpO2:  [87 %-96 %] 95 % (05/27 0400) Weight:  [62.1 kg-69.8 kg] 62.1 kg (05/27 0400) Last BM Date: 11/09/20  Intake/Output from previous day: 05/26 0701 - 05/27 0700 In: 753.6 [P.O.:75; IV Piggyback:678.6] Out: 1100 [Emesis/NG output:1100] Intake/Output this shift: Total I/O In: -  Out: 1 [Stool:1]  PE: Gen: Alert, NAD, pleasant Cardio: HR upper 90's Pulm: decreased breath sounds bilateral bases, no wheezing or rhonchi, rate and effort normal on 2L [place] IHW:TUUE, minimal distension, nontender, few BS heard, incisions cdi without erythema or drainage  Lab Results:  Recent Labs    11/10/20 0438 11/11/20 0529  WBC 23.0* 17.0*  HGB 12.0 11.4*  HCT 40.3 37.8  PLT 455* 441*   BMET Recent Labs    11/10/20 0438 11/11/20 0529  NA 136 136  K 3.2* 3.7  CL 100 101  CO2 27 27  GLUCOSE 132* 125*  BUN 22 37*  CREATININE 0.56 0.86  CALCIUM 9.3 9.1   PT/INR No results for input(s): LABPROT, INR in the last 72 hours. CMP     Component Value Date/Time   NA 136 11/11/2020 0529   NA 139 02/21/2018 0854   K 3.7 11/11/2020 0529   CL 101 11/11/2020 0529   CO2 27 11/11/2020 0529   GLUCOSE 125 (H) 11/11/2020 0529   BUN 37 (H) 11/11/2020 0529   BUN 20 02/21/2018 0854   CREATININE 0.86 11/11/2020 0529   CREATININE 0.54 (L) 11/28/2015 1422   CALCIUM 9.1 11/11/2020 0529   PROT 5.6 (L) 11/11/2020 0529   ALBUMIN 2.6  (L) 11/11/2020 0529   AST 13 (L) 11/11/2020 0529   ALT 17 11/11/2020 0529   ALKPHOS 101 11/11/2020 0529   BILITOT 1.9 (H) 11/11/2020 0529   GFRNONAA >60 11/11/2020 0529   GFRAA 78 02/21/2018 0854   Lipase     Component Value Date/Time   LIPASE 31 10/26/2020 1919       Studies/Results: CT ABDOMEN PELVIS WO CONTRAST  Addendum Date: 11/10/2020   ADDENDUM REPORT: 11/10/2020 16:01 ADDENDUM: Critical Value/emergent results were called by telephone at the time of interpretation on 11/10/2020 at 4:01 pm to provider [facility] , who verbally acknowledged these results. Electronically Signed   By: Marijo Conception M.D.   On: 11/10/2020 16:01   Result Date: 11/10/2020 CLINICAL DATA:  Abdominal distension, vomiting. Status post colon resection. EXAM: CT ABDOMEN AND PELVIS WITHOUT CONTRAST TECHNIQUE: Multidetector CT imaging of the abdomen and pelvis was performed following the standard protocol without IV contrast. COMPARISON:  Oct 28, 2020. FINDINGS: Lower chest: Left basilar subsegmental atelectasis or infiltrate is noted. Hepatobiliary: There appears to be left intrahepatic portal venous gas. Status post cholecystectomy. No biliary dilatation. Pancreas: Unremarkable. No pancreatic ductal dilatation or surrounding inflammatory changes. Spleen: Normal in size without focal abnormality. Adrenals/Urinary Tract: Adrenal glands appear normal. Stable large right renal cyst is noted. No hydronephrosis or renal obstruction  is noted. No renal or ureteral calculi are noted. Urinary bladder is unremarkable. Stomach/Bowel: Status post right colectomy. Moderate gastric distention is noted as well as severe small bowel dilatation concerning for distal small bowel obstruction. There appears to be intramural gas involving portions of the dilated small bowel concerning for possible ischemic bowel. Vascular/Lymphatic: Aortic atherosclerosis. No enlarged abdominal or pelvic lymph nodes. Reproductive: Status post  hysterectomy. No adnexal masses. Other: No abdominal wall hernia or abnormality. No abdominopelvic ascites. Musculoskeletal: No acute or significant osseous findings. IMPRESSION: Severely dilated small bowel loops are noted as well as moderate gastric distension concerning for distal small bowel obstruction. There also appears to be intramural gas involving portions of the dilated small bowel concerning for ischemic bowel. Also noted is a small amount of left intrahepatic portal venous gas, also suggesting ischemic bowel. Left basilar subsegmental atelectasis or infiltrate is noted. Status post right colectomy. Aortic Atherosclerosis (ICD10-I70.0). Electronically Signed: By: Marijo Conception M.D. On: 11/10/2020 15:54   DG Abd 1 View  Result Date: 11/10/2020 CLINICAL DATA:  NG tube placement. EXAM: ABDOMEN - 1 VIEW COMPARISON:  Earlier same day FINDINGS: 2101 hours. NG tube tip is in the mid stomach. Gaseous distention of bowel noted in the visualized upper abdomen. IMPRESSION: NG tube tip is in the mid stomach. Electronically Signed   By: Misty Stanley M.D.   On: 11/10/2020 21:32   DG Abd 2 Views  Result Date: 11/10/2020 CLINICAL DATA:  Vomiting, abdominal distension EXAM: ABDOMEN - 2 VIEW COMPARISON:  None. FINDINGS: Two view radiograph the abdomen demonstrates multiple gas-filled dilated loops of small-bowel throughout the abdomen demonstrating air-fluid levels at multiple levels. There is a relative paucity of gas within the colon. Together the findings are in keeping with a distal small bowel obstruction. No free intraperitoneal gas. Cholecystectomy clips seen in the right upper quadrant. IMPRESSION: Distal small bowel obstruction. Electronically Signed   By: Fidela Salisbury MD   On: 11/10/2020 01:15   [place] EKG SITE RITE  Result Date: 11/10/2020 If Site Rite image not attached, placement could not be confirmed due to current cardiac rhythm.   Anti-infectives: Anti-infectives (From admission, onward)    Start     Dose/Rate Route Frequency Ordered Stop   11/11/20 1800  anidulafungin (ERAXIS) 100 mg in sodium chloride 0.9 % 100 mL IVPB       "Followed by" Linked Group Details   100 mg 78 mL/hr over 100 Minutes Intravenous Every 24 hours 11/10/20 1622 11/15/20 1759   11/10/20 2200  piperacillin-tazobactam (ZOSYN) IVPB 3.375 g        3.375 g 12.5 mL/hr over 240 Minutes Intravenous Every 8 hours 11/10/20 1610     11/10/20 1800  anidulafungin (ERAXIS) 200 mg in sodium chloride 0.9 % 200 mL IVPB       "Followed by" Linked Group Details   200 mg 78 mL/hr over 200 Minutes Intravenous  Once 11/10/20 1622 11/11/20 0016   11/10/20 1700  anidulafungin (ERAXIS) 100 mg in sodium chloride 0.9 % 100 mL IVPB  Status:  Discontinued        100 mg 78 mL/hr over 100 Minutes Intravenous Every 24 hours 11/10/20 1610 11/10/20 1621   11/10/20 1700  piperacillin-tazobactam (ZOSYN) IVPB 3.375 g        3.375 g 100 mL/hr over 30 Minutes Intravenous  Once 11/10/20 1618 11/10/20 1720   11/08/20 0045  vancomycin (VANCOCIN) 50 mg/mL oral solution 125 mg  Status:  Discontinued  125 mg Oral Every 6 hours 11/07/20 2354 11/08/20 1304   10/31/20 2200  cefoTEtan (CEFOTAN) 2 g in sodium chloride 0.9 % 100 mL IVPB        2 g 200 mL/hr over 30 Minutes Intravenous Every 12 hours 10/31/20 1744 10/31/20 2205   10/31/20 0915  cefoTEtan (CEFOTAN) 2 g in sodium chloride 0.9 % 100 mL IVPB        2 g 200 mL/hr over 30 Minutes Intravenous On call to O.R. 10/31/20 0824 10/31/20 1312       Assessment/Plan Atrial fibrillation- on toprol and cardizem, eliquis stopped 5/26 - ok for IV heparin if indicated per TRH HTN HLD Diarrhea - c diffnegative 5/23 Severe malnutrition - prealbumin 5.7 (5/27), starting TPN --per TRH--  POD#11 -s/p lap assisted right hemicolectomy for ascending colon mass (adenocarcinoma) and multiple polyps (likely pre-malignant), Dr. Johney Maine 10/31/20 - surgical path:Invasive moderately differentiated  adenocarcinoma with mucinous  features, 4 cm; tumor invades the muscularis propria; margins of resection are not involved; twenty-one lymph nodes, negative for carcinoma (0/21). - CT 5/26 with massive small bowel dilatation and gastric distention, possible distal SBO, concern for possible pneumatosis along the duodenum and may be some portal venous gas, no free air or perforation - NG tube replaced 5/26 and patient started on IV zosyn and IV eraxis  FEN -IVF, NPO/NGT to LIWS, TPN VTE -lovenox ID -Cefotetanpre-op, PO vancomycin 5/24>>5/24, zosyn 5/26>>, eraxis 5/26>>  Plan: Clinically much better today. Abdominal exam benign and WBC trending down. Continue NPO/NGT to LIWS for bowel rest. Continue abx as above. Mobilize as able. Ok for IV heparin if indicated per TRH. TNA has been ordered and should start today.   LOS: 15 days    Wellington Hampshire, [facility] Surgery 11/11/2020, 8:32 AM Please see Amion for pager number during day hours 7:00am-4:30pm

## 2020-11-11 NOTE — Progress Notes (Signed)
Nutrition Follow-up  DOCUMENTATION CODES:   Not applicable  INTERVENTION:  - TPN initiation and advancement per Pharmacist. - diet advancement as medically feasible.    NUTRITION DIAGNOSIS:   Increased nutrient needs related to acute illness as evidenced by estimated needs. -ongoing  GOAL:   Patient will meet greater than or equal to 90% of their needs -unmet/unable to meet at this time  MONITOR:   Diet advancement,Labs,Weight trends,I & O's,Other (Comment) (TPN regimen)  REASON FOR ASSESSMENT:   Consult New TPN/TNA  ASSESSMENT:   86 y.o. female with medical history of A.Fib on Eliquis and HTN. She presented to the ED for evaluation of anemia and SOB which has been ongoing since 06/2020.  Significant Events: 5/11- admission 5/15- initial RD assessment 5/16- lap assisted R hemicolectomy for ascending colon mass (adenocarcinoma) and multiple polyps 5/18- NGT placement 5/19- diet changed from CLD to NPO 5/21- diet advanced to CLD; NGT removed 5/24- diet advanced to FLD and then to Soft 5/25- diet advanced to Heart Healthy 5/26- made NPO at midnight d/t N/V and abdominal distention and tautness; NGT replaced   Patient remains NPO. No PICC/central line in place at this time. Plan to start custom TPN @ 40 ml/hr tonight. Goal rate is 75 ml/hr and will provide 1818 kcal and 90 grams protein/24 hrs.  Estimated nutrition needs from follow-up assessment yesterday remain appropriate.   NGT to LIS and flow sheet documentation indicates 1100 ml output during night shift.   Weight slightly down from 5/25.     Labs reviewed; BUN: 37 mg/dl. Medications reviewed; sliding scale novolog, 1 g IV Mg sulfate x1 run 5/26, 40 mg IV protonix BID, 10 mEq IV KCl x5 runs 5/26 and x3 runs 5/27.   Diet Order:   Diet Order            Diet NPO time specified Except for: Ice Chips  Diet effective now                 EDUCATION NEEDS:   No education needs have been identified at this  time  Skin:  Skin Assessment: Reviewed RN Assessment  Last BM:  5/27 (type 6 x1 and type 7 x2)  Height:   Ht Readings from Last 1 Encounters:  11/05/20 5\' 4"  (1.626 m)    Weight:   Wt Readings from Last 1 Encounters:  11/11/20 62.1 kg    Estimated Nutritional Needs:  Kcal:  1700-1900 kcal Protein:  80-95 grams Fluid:  >/= 2 L/day      Jarome Matin, MS, RD, LDN, CNSC Inpatient Clinical Dietitian RD pager # available in AMION  After hours/weekend pager # available in [facility]

## 2020-11-11 NOTE — Progress Notes (Addendum)
[place] for IV heparin Indication: atrial fibrillation  Allergies  Allergen Reactions  . Iodinated Diagnostic Agents Shortness Of Breath  . Amlodipine     Shortness of breath  . Codeine Nausea Only  . Diphenhydramine Other (See Comments)    hypertension  . Diphenhydramine Hcl     Hypertension   . Pravastatin     Muscle aches  . Zetia [Ezetimibe]   . Sulfa Antibiotics Rash  . Sulfa Drugs Cross Reactors Rash    Patient Measurements: Height: 5\' 4"  (162.6 cm) Weight: 62.1 kg (136 lb 14.5 oz) IBW/kg (Calculated) : 54.7 Heparin Dosing Weight: TBW  Vital Signs: Temp: 97.6 F (36.4 C) (05/27 1307) Temp Source: Oral (05/27 0920) BP: 117/66 (05/27 1307) Pulse Rate: 96 (05/27 1307)  Labs: Recent Labs    11/09/20 0447 11/09/20 5643 11/09/20 3295 11/10/20 0438 11/11/20 0529 11/11/20 0911 11/11/20 1816  HGB  --  11.2*   < > 12.0 11.4*  --   --   HCT  --  38.2  --  40.3 37.8  --   --   PLT  --  402*  --  455* 441*  --   --   APTT  --   --   --   --   --  35 35  HEPARINUNFRC  --   --   --   --   --  >1.10*  --   CREATININE 0.45  --   --  0.56 0.86  --   --    < > = values in this interval not displayed.    Estimated Creatinine Clearance: 42.8 mL/min (by C-G formula based on SCr of 0.86 mg/dL).   Medical History: Past Medical History:  Diagnosis Date  . Anxiety   . AV bloc first degree   . Carotid stenosis 03/02/2017  . Hyperlipidemia   . Hypertension   . Lumbar pain    fell off of her horse and has low back pain at times  . Venous insufficiency of both lower extremities   . Vertigo     Medications:  Medications Prior to Admission  Medication Sig Dispense Refill Last Dose  . atorvastatin (LIPITOR) 10 MG tablet Take 0.5 tablets by mouth 3 (three) times a week.   10/24/2020 at unknown time  . Cholecalciferol (VITAMIN D) 50 MCG (2000 UT) tablet Take 2,000 Units by mouth at bedtime.   10/25/2020 at Unknown time  .  diphenoxylate-atropine (LOMOTIL) 2.5-0.025 MG per tablet Take 1 tablet by mouth daily as needed (for diarrhea).    10/26/2020 at Unknown time  . ELIQUIS 5 MG TABS tablet TAKE 1 TABLET BY MOUTH TWICE DAILY. 180 tablet 1 10/26/2020 at 0800  . estradiol (ESTRACE) 1 MG tablet Take 0.5 mg by mouth daily.   10/26/2020 at Unknown time  . Lactobacillus Rhamnosus, GG, (CULTURELLE PO) Take 1 capsule by mouth every morning.   10/26/2020 at Unknown time  . Lutein 20 MG CAPS Take 1 capsule by mouth every evening.   10/25/2020 at Unknown time  . metoprolol succinate (TOPROL-XL) 50 MG 24 hr tablet Take 50 mg by mouth 2 (two) times daily.   10/26/2020 at 0800  . potassium chloride SA (KLOR-CON) 20 MEQ tablet TAKE 1 TABLET BY MOUTH TWICE DAILY. (Patient taking differently: Take 20 mEq by mouth daily.) 180 tablet 2 10/26/2020 at Unknown time  . TIADYLT ER 180 MG 24 hr capsule TAKE (1) CAPSULE DAILY. (Patient taking differently: Take 180 mg  by mouth daily.) 90 capsule 2 10/26/2020 at Unknown time  . tiZANidine (ZANAFLEX) 2 MG tablet Take 2 mg by mouth every 8 (eight) hours as needed for muscle spasms.   unknown  . triamcinolone cream (KENALOG) 0.1 % Apply 1 application topically daily as needed (for rash).    unknown  . triamterene-hydrochlorothiazide (MAXZIDE-25) 37.5-25 MG tablet Take 1 tablet by mouth daily.   10/26/2020 at Unknown time  . vitamin A 10000 UNIT capsule Take 10,000 Units by mouth daily.   10/26/2020 at Unknown time   Scheduled:  . Chlorhexidine Gluconate Cloth  6 each Topical Daily  . feeding supplement  1 Container Oral TID BM  . insulin aspart  0-9 Units Subcutaneous Q8H  . lip balm  1 application Topical BID  . liver oil-zinc oxide   Topical BID  . metoprolol tartrate  5 mg Intravenous Q6H  . pantoprazole (PROTONIX) IV  40 mg Intravenous Q12H  . sodium chloride flush  10-40 mL Intracatheter Q12H   Assessment: 65 yoF with PMH Afib on Eliquis, s/p hemicolectomy 5/16 for colon adenocarcinoma; now with  severe SBO and possible need for imminent surgical intervention. Pharmacy consulted to dose IV heparin infusion.   Baseline INR, aPTT: pending  Prior anticoagulation: Eliquis 5 mg BID; LD 5/26 AM; did get 1 dose LMWH 40 mg 5/26 PM after Eliquis held  Significant events:  Today, 11/11/2020:  PTT = 35 (subtherapeutic) on heparin 850 units/hr  No bleeding or infusion issues documented  Goal of Therapy: PTT 66-102 seconds Heparin level 0.3-0.7 units/ml Monitor platelets by anticoagulation protocol: Yes  Plan:  Heparin 1000 units IV bolus, increase heparin drip to 950 units/hr  Check aPTT 8hr after rate increase  Daily CBC, daily heparin level once stable  Monitor for signs of bleeding or thrombosis  Peggyann Juba, PharmD, BCPS Pharmacy: [phone] 11/11/2020, 7:28 PM   Addendum: Informed by RN that IV heparin has been off for several hours for placement of midline catheter, it was resumed approximately 7p, therefore PTT of 35 seconds was without heparin infusing.  Will still give 1000 unit bolus but will leave rate at 850 units/hr and check PTT and heparin level at [place], PharmD, BCPS 11/11/2020 8:38 PM

## 2020-11-11 NOTE — Progress Notes (Signed)
PHARMACY - TOTAL PARENTERAL NUTRITION CONSULT NOTE   Indication: Small bowel obstruction; anticipate prolonged NPO status  Patient Measurements: Height: 5\' 4"  (162.6 cm) Weight: 62.1 kg (136 lb 14.5 oz) IBW/kg (Calculated) : 54.7 TPN AdjBW (KG): 65.4 Body mass index is 23.5 kg/m. Usual Weight: 66 kg (~10 lb weight loss this admission)  Assessment:  28 yoF with PMH Afib admitted 5/11 with anemia; found to have bleeding colonic mass suspicious for malignancy and underwent hemicolectomy 5/16. Developed ileus POD3 which initially improved, then worsened POD10. CT showed massive SBO and no immediate indication for surgery, so Pharmacy consulted to begin TPN for expected prolonged ileus.  Glucose / Insulin: no Hx DM; SBGs well controlled prior to starting TPN  Electrolytes: K borderline low for ileus; otherwise labs WNL Renal: SCr, bicarb stable WNL; BUN slightly elevated today; previously WNL; UOP not charted Hepatic: (5/27) Tbili elevated consistent with earlier this admission; albumin low/stable; TG and LFTs WNL Prealbumin: significantly low 5/27 I/O: significant (>1L) NG output - no MIVF - eating well prior to admission, but likely some degree of malnutrition even then d/t malignancy; inconsistent diet since admission so will treat as moderate risk for refeeding GI Imaging: - 5/13 colonoscopy: multiple polyps; larger masses at cecum and transverse colon - 5/14 MRI liver: no metastatic dz - 5/18 AXR: dilated SB loops; possible ileus - 5/26 CTa/p: severely dilated SB loops (not including ileocolonic anastomosis) concerning for distal SBO, also intramural and intrahepatic portal venous gas suggesting possible ischemic bowel Surgeries / Procedures:  - 5/16 laparoscopic hemicolectomy/cecectomy; omentopexy of ileocolonic anastomosis  Central access: awaiting PICC placement TPN start date: 5/27 (pending PICC placement)  Nutritional Goals RD recs pending Kcal:  1700-1900 kcal Protein:   80-95 grams Fluid:  >/= 2 L/day  TPN at goal rate of 75 mL/hr provides 90 g of protein and 1818 kcals per day  Current Nutrition:  NPO, TPN  Plan:   Start TPN at 30 mL/hr at 1800 given moderate risk for refeeding  Electrolytes in TPN: (standard)  Na - 27mEq/L  K - 50mEq/L  Ca - 12mEq/L  Mg - 54mEq/L  Phos - 61mmol/L  Cl:Ac ratio 1:1  Add standard MVI and trace elements to TPN  Initiate Sensitive q8h SSI and adjust as needed   MIVF per primary  Monitor TPN labs on Mon/Thurs, Bmet, Phos, Mag tomorrow  Reuel Boom, PharmD, BCPS [phone] 11/11/2020, 7:26 AM

## 2020-11-11 NOTE — Progress Notes (Signed)
PROGRESS NOTE    Robin [name]  WHQ:[id number] DOB: Apr 12, 1935 DOA: 10/26/2020 PCP: [name], PA-C   No chief complaint on file.  Brief Narrative:  86 year old F with PMH of A. fib on Eliquis and HTN presented to ED for anemia and shortness of breath, and admitted for blood loss anemia due to GI bleed.  Hgb 8.5 (reportedly 11 in January 2022 per daughter).  Hemoccult positive.  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  Colonoscopy on 5/13 confirmed malignant tumor adjacent to the IC valve. Pathology from proximal mass with invasive adenocarcinoma.  Pathology of polyp from transverse colon and splenic flexure with at least high-grade dysplasia. MRI abdomen with and without contrast without significant finding within the visualized portion of the abdomen. Patient underwent laparoscopic proximal hemicolectomy with reanastomosis on 5/16.  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 MRI as above.  Pathology from proximal mass with invasive adenocarcinoma.  Pathology of polyp from transverse colon and splenic flexure with at least high-grade dysplasia.  -S/p laparoscopic proximal hemicolectomy with reanastomosis by Dr. Johney Maine on 5/16 -General surgery managing -surgical  pathology -> invasive moderately differentiated adenocarcinoma with mucinous features, tumor invades muscularis propia, margins of resction not involved, 21 LN's negative for carcinoma.  - Dr. Johney Maine planning for patient to follow up with tumor board - Will need outpatient oncology follow up  Nausea  Vomiting  Ileus  - plain film with dilated small bowel loobs concerning for distal small bowel obstruction or ileus - NGT d/c'd 5/21, diet was advanced but then de-escalated to n.p.o. due to following changes.  Patient had fever of 101.4 early this morning with tachycardia and now rising leukocytosis as well as worsening abdominal pain.  Abdominal x-ray showed distal small bowel obstruction. CT 5/26 with massive small bowel dilatation and gastric distention, possible distal SBO, concern for possible pneumatosis along the duodenum and may be some portal venous gas, no free air or perforation. NG tube replaced 5/26 and patient started on IV zosyn and IV eraxis.  Patient feels better, no abdominal pain or tenderness, leukocytosis improving and she is afebrile.  She has been started on TNA.  Personal surgery note, it may take few days for her to recover.  We hope that she will not require surgery.  Diarrhea - negative c diff studies.  Imodium held.  Severe iron deficiency anemia due to acute on chronic blood loss from GI bleed: FOBT positive.  Hgb 13.7 in 2019, and reportedly about 12 in January 2022 per daughter. - Hb relatively stable today, follow - labs c/w iron def anemia, low normal B12 (follow MMA - pending)  Paroxysmal A. Fib with RVR: On Toprol-XL, Cardizem and Eliquis at home.  CHA2DS2-VASc score 4 (age, sex, HTN) -Beta-blocker switched to Lopressor 5 mg IV every 6 hours due to  n.p.o. status.  Rate well controlled today.  Consulted pharmacy to start her on heparin as CT abdomen is also concerning for possible ischemic bowel findings.  We will hold off to starting on any Cardizem as her heart  rate remains around 90.  Essential hypertension: Normotensive. Continue IV Lopressor.  Hypokalemia/hypophosphatemia/Hypomagnesemia: All normal today  Hyperglycemia -Hemoglobin A1c within normal range.  DVT prophylaxis: Heparin drip Code Status: full Family Communication: None present at bedside.  General surgery to update daughter. Disposition:   Status is: Inpatient  Remains inpatient appropriate because:Inpatient level of care appropriate due to severity of illness   Dispo: The patient is from: Home              Anticipated d/c is to: Home              Patient currently is not medically stable to d/c.   Difficult to place patient No       Consultants:   Surgery  GI  Procedures:  5/16 PROCEDURE:   LAPAROSCOPIC PROXIMAL HEMICOLECTOMY  OMENTOPEXY OF ANASTOMOSIS LAPAROSCOPIC LYSIS OF ADHESIONS TRANSVERSUS ABDOMINIS PLANE (TAP) BLOCK - BILATERAL  Colonoscopy - Obvious malignant tumor adjacent to the IC valve. Biopsied. - Several polyps were also removed (mixed snare cautery and cold snare). The largest was located at the presumed splenic flexure, removed piecemeal, and although it was soft and not overtly malignant I labled the site with submucosal Spot injections given it's size (2.5cm). - Return patient to hospital ward for ongoing care. - Will need staging tests with CT scan chest, abd, pelvis. CEA. - I recommend continuing to hold eliquis for now. Await pathology results. - General surgery has already been consulted. - [facility] will return to see her on Monday. Drs. Collene Mares and Benson Norway are covering this weekend if needed.  Antimicrobials: Anti-infectives (From admission, onward)   Start     Dose/Rate Route Frequency Ordered Stop   11/11/20 1800  anidulafungin (ERAXIS) 100 mg in sodium chloride 0.9 % 100 mL IVPB       "Followed by" Linked Group Details   100 mg 78 mL/hr over 100 Minutes Intravenous Every 24 hours 11/10/20 1622 11/15/20 1759   11/10/20 2200   piperacillin-tazobactam (ZOSYN) IVPB 3.375 g        3.375 g 12.5 mL/hr over 240 Minutes Intravenous Every 8 hours 11/10/20 1610     11/10/20 1800  anidulafungin (ERAXIS) 200 mg in sodium chloride 0.9 % 200 mL IVPB       "Followed by" Linked Group Details   200 mg 78 mL/hr over 200 Minutes Intravenous  Once 11/10/20 1622 11/11/20 0016   11/10/20 1700  anidulafungin (ERAXIS) 100 mg in sodium chloride 0.9 % 100 mL IVPB  Status:  Discontinued        100 mg 78 mL/hr over 100 Minutes Intravenous Every 24 hours 11/10/20 1610 11/10/20 1621   11/10/20 1700  piperacillin-tazobactam (ZOSYN) IVPB 3.375 g        3.375 g 100 mL/hr over 30 Minutes Intravenous  Once 11/10/20 1618 11/10/20 1720   11/08/20 0045  vancomycin (VANCOCIN) 50 mg/mL oral solution 125 mg  Status:  Discontinued        125 mg Oral Every 6 hours 11/07/20 2354 11/08/20 1304   10/31/20 2200  cefoTEtan (CEFOTAN) 2 g in sodium chloride 0.9 % 100 mL IVPB        2 g 200 mL/hr over 30 Minutes Intravenous Every 12 hours 10/31/20 1744 10/31/20 2205   10/31/20  0915  cefoTEtan (CEFOTAN) 2 g in sodium chloride 0.9 % 100 mL IVPB        2 g 200 mL/hr over 30 Minutes Intravenous On call to O.R. 10/31/20 4268 10/31/20 1312        Subjective: Seen and examined.  She feels better than yesterday.  No more abdominal pain.  Objective: Vitals:   11/11/20 0030 11/11/20 0400 11/11/20 0617 11/11/20 0920  BP:  120/71 (!) 112/54   Pulse: 68 (!) 107 (!) 105 92  Resp:  16 20   Temp:  100.2 F (37.9 C) (!) 97.5 F (36.4 C) 97.7 F (36.5 C)  TempSrc:  Axillary Oral Oral  SpO2:  95%  97%  Weight:  62.1 kg    Height:        Intake/Output Summary (Last 24 hours) at 11/11/2020 1139 Last data filed at 11/11/2020 0816 Gross per 24 hour  Intake 753.63 ml  Output 1101 ml  Net -347.37 ml   Filed Weights   11/10/20 0400 11/10/20 1651 11/11/20 0400  Weight: 59.9 kg 69.8 kg 62.1 kg    Examination:  General exam: Appears calm and comfortable   Respiratory system: Clear to auscultation. Respiratory effort normal. Cardiovascular system: S1 & S2 heard, RRR. No JVD, murmurs, rubs, gallops or clicks. No pedal edema. Gastrointestinal system: Abdomen is slightly distended, soft and nontender. No organomegaly or masses felt.  Diminished bowel sounds. Central nervous system: Alert and oriented. No focal neurological deficits. Extremities: Symmetric 5 x 5 power. Skin: No rashes, lesions or ulcers.  Psychiatry: Judgement and insight appear normal. Mood & affect appropriate.   Data Reviewed: I have personally reviewed following labs and imaging studies  CBC: Recent Labs  Lab 11/05/20 0527 11/06/20 0521 11/07/20 0508 11/08/20 0427 11/09/20 0652 11/10/20 0438 11/11/20 0529  WBC 10.9*   < > 16.4* 14.0* 12.6* 23.0* 17.0*  NEUTROABS 9.2*  --   --  11.7* 10.4* 21.7* 15.7*  HGB 10.0*   < > 10.4* 11.1* 11.2* 12.0 11.4*  HCT 33.9*   < > 35.2* 38.9 38.2 40.3 37.8  MCV 79.4*   < > 81.1 82.8 80.9 79.3* 79.4*  PLT 304   < > 286 370 402* 455* 441*   < > = values in this interval not displayed.    Basic Metabolic Panel: Recent Labs  Lab 11/07/20 0508 11/08/20 0427 11/09/20 0447 11/10/20 0438 11/11/20 0529  NA 137 135 137 136 136  K 3.6 3.1* 3.6 3.2* 3.7  CL 104 103 106 100 101  CO2 28 24 25 27 27   GLUCOSE 175* 98 115* 132* 125*  BUN 18 22 20 22  37*  CREATININE 0.52 0.56 0.45 0.56 0.86  CALCIUM 8.7* 8.9 8.9 9.3 9.1  MG 1.7 1.9 1.8 1.6* 2.1  PHOS 2.1* 2.5 2.0* 2.8 3.4    GFR: Estimated Creatinine Clearance: 42.8 mL/min (by C-G formula based on SCr of 0.86 mg/dL).  Liver Function Tests: Recent Labs  Lab 11/06/20 0521 11/07/20 0508 11/08/20 0427 11/09/20 0447 11/11/20 0529  AST 15 24 18  12* 13*  ALT 23 30 28 21 17   ALKPHOS 114 105 120 105 101  BILITOT 1.7* 1.3* 1.7* 1.1 1.9*  PROT 5.4* 5.2* 5.6* 5.2* 5.6*  ALBUMIN 2.7* 2.5* 2.7* 2.6* 2.6*    CBG: No results for input(s): GLUCAP in the last 168 hours.   Recent  Results (from the past 240 hour(s))  C Difficile Quick Screen (NO PCR Reflex)     Status: None  Collection Time: 11/07/20  2:01 PM   Specimen: STOOL  Result Value Ref Range Status   C Diff antigen NEGATIVE NEGATIVE Final   C Diff toxin NEGATIVE NEGATIVE Final   C Diff interpretation No C. difficile detected.  Final    Comment: Performed at [facility], [place] [address], [place], [place] [address]  Culture, blood (routine x 2)     Status: None (Preliminary result)   Collection Time: 11/10/20  8:17 AM   Specimen: BLOOD  Result Value Ref Range Status   Specimen Description   Final    BLOOD BLOOD RIGHT FOREARM Performed at [facility] [address], [place], [place] [address]    Special Requests   Final    BOTTLES DRAWN AEROBIC AND ANAEROBIC Blood Culture adequate volume Performed at [facility] [address] [place], [place] [address]    Culture   Final    NO GROWTH < 24 HOURS Performed at [facility] [address], [place], [place] [address]    Report Status PENDING  Incomplete  Culture, blood (routine x 2)     Status: None (Preliminary result)   Collection Time: 11/10/20  8:21 AM   Specimen: BLOOD  Result Value Ref Range Status   Specimen Description   Final    BLOOD BLOOD LEFT FOREARM Performed at [facility] [address] [place], [place] [address]    Special Requests   Final    BOTTLES DRAWN AEROBIC AND ANAEROBIC Blood Culture adequate volume Performed at [facility] [address], [place], [place] [address]    Culture   Final    NO GROWTH < 24 HOURS Performed at [facility] [address], [place],  [address]    Report Status PENDING  Incomplete         Radiology Studies: CT ABDOMEN PELVIS WO CONTRAST  Addendum Date: 11/10/2020   ADDENDUM REPORT: 11/10/2020 16:01 ADDENDUM: Critical Value/emergent results were called by telephone at  the time of interpretation on 11/10/2020 at 4:01 pm to provider [facility] , who verbally acknowledged these results. Electronically Signed   By: Marijo Conception M.D.   On: 11/10/2020 16:01   Result Date: 11/10/2020 CLINICAL DATA:  Abdominal distension, vomiting. Status post colon resection. EXAM: CT ABDOMEN AND PELVIS WITHOUT CONTRAST TECHNIQUE: Multidetector CT imaging of the abdomen and pelvis was performed following the standard protocol without IV contrast. COMPARISON:  Oct 28, 2020. FINDINGS: Lower chest: Left basilar subsegmental atelectasis or infiltrate is noted. Hepatobiliary: There appears to be left intrahepatic portal venous gas. Status post cholecystectomy. No biliary dilatation. Pancreas: Unremarkable. No pancreatic ductal dilatation or surrounding inflammatory changes. Spleen: Normal in size without focal abnormality. Adrenals/Urinary Tract: Adrenal glands appear normal. Stable large right renal cyst is noted. No hydronephrosis or renal obstruction is noted. No renal or ureteral calculi are noted. Urinary bladder is unremarkable. Stomach/Bowel: Status post right colectomy. Moderate gastric distention is noted as well as severe small bowel dilatation concerning for distal small bowel obstruction. There appears to be intramural gas involving portions of the dilated small bowel concerning for possible ischemic bowel. Vascular/Lymphatic: Aortic atherosclerosis. No enlarged abdominal or pelvic lymph nodes. Reproductive: Status post hysterectomy. No adnexal masses. Other: No abdominal wall hernia or abnormality. No abdominopelvic ascites. Musculoskeletal: No acute or significant osseous findings. IMPRESSION: Severely dilated small bowel loops are noted as well as moderate gastric distension concerning for distal small bowel obstruction.  There also appears to be intramural gas involving portions of the dilated small bowel concerning for ischemic bowel. Also noted is a small amount of left intrahepatic  portal venous gas, also suggesting ischemic bowel. Left basilar subsegmental atelectasis or infiltrate is noted. Status post right colectomy. Aortic Atherosclerosis (ICD10-I70.0). Electronically Signed: By: Marijo Conception M.D. On: 11/10/2020 15:54   DG Abd 1 View  Result Date: 11/10/2020 CLINICAL DATA:  NG tube placement. EXAM: ABDOMEN - 1 VIEW COMPARISON:  Earlier same day FINDINGS: 2101 hours. NG tube tip is in the mid stomach. Gaseous distention of bowel noted in the visualized upper abdomen. IMPRESSION: NG tube tip is in the mid stomach. Electronically Signed   By: Misty Stanley M.D.   On: 11/10/2020 21:32   DG CHEST PORT 1 VIEW  Result Date: 11/11/2020 CLINICAL DATA:  Status post PICC line placement EXAM: PORTABLE CHEST 1 VIEW COMPARISON:  10/26/2020 chest radiograph and 10/28/2020 CT FINDINGS: Right-sided PICC line terminates at the low SVC. Midline trachea. Mild cardiomegaly. Atherosclerosis in the transverse aorta. No pleural effusion or pneumothorax. Diffuse interstitial thickening. Suspect developing more confluent inferior right upper and bibasilar patchy airspace opacities. IMPRESSION: Right-sided PICC line tip at low SVC, without pneumothorax. Suspicion of developing pulmonary opacities, superimposed upon chronic interstitial thickening. Correlate with infectious symptoms to exclude early pneumonia. Aortic Atherosclerosis (ICD10-I70.0). Electronically Signed   By: Abigail Miyamoto M.D.   On: 11/11/2020 10:55   DG Abd 2 Views  Result Date: 11/10/2020 CLINICAL DATA:  Vomiting, abdominal distension EXAM: ABDOMEN - 2 VIEW COMPARISON:  None. FINDINGS: Two view radiograph the abdomen demonstrates multiple gas-filled dilated loops of small-bowel throughout the abdomen demonstrating air-fluid levels at multiple levels. There is a relative paucity of gas within the colon. Together the findings are in keeping with a distal small bowel obstruction. No free intraperitoneal gas. Cholecystectomy clips seen  in the right upper quadrant. IMPRESSION: Distal small bowel obstruction. Electronically Signed   By: Fidela Salisbury MD   On: 11/10/2020 01:15   [place] EKG SITE RITE  Result Date: 11/10/2020 If Site Rite image not attached, placement could not be confirmed due to current cardiac rhythm.       Scheduled Meds: . Chlorhexidine Gluconate Cloth  6 each Topical Daily  . feeding supplement  1 Container Oral TID BM  . insulin aspart  0-9 Units Subcutaneous Q8H  . lip balm  1 application Topical BID  . liver oil-zinc oxide   Topical BID  . metoprolol tartrate  5 mg Intravenous Q6H  . pantoprazole (PROTONIX) IV  40 mg Intravenous Q12H  . sodium chloride flush  10-40 mL Intracatheter Q12H   Continuous Infusions: . anidulafungin    . heparin    . lactated ringers    . methocarbamol (ROBAXIN) IV    . piperacillin-tazobactam (ZOSYN)  IV 3.375 g (11/11/20 0624)  . potassium chloride    . TPN ADULT (ION)       LOS: 15 days    Time spent: 31 minutes  Darliss Cheney, MD [facility]   To contact the attending provider between 7A-7P or the covering provider during after hours 7P-7A, please log into the web site [email or web address] and access using universal [place] password for that web site. If you do not have the password, please call the hospital operator.  11/11/2020, 11:39 AM

## 2020-11-12 ENCOUNTER — Inpatient Hospital Stay (HOSPITAL_COMMUNITY): Payer: Medicare PPO

## 2020-11-12 DIAGNOSIS — C180 Malignant neoplasm of cecum: Secondary | ICD-10-CM | POA: Diagnosis not present

## 2020-11-12 LAB — GLUCOSE, CAPILLARY
Glucose-Capillary: 153 mg/dL — ABNORMAL HIGH (ref 70–99)
Glucose-Capillary: 131 mg/dL — ABNORMAL HIGH (ref 70–99)
Glucose-Capillary: 132 mg/dL — ABNORMAL HIGH (ref 70–99)
Glucose-Capillary: 170 mg/dL — ABNORMAL HIGH (ref 70–99)

## 2020-11-12 LAB — BASIC METABOLIC PANEL
Anion gap: 6 (ref 5–15)
BUN: 28 mg/dL — ABNORMAL HIGH (ref 8–23)
CO2: 27 mmol/L (ref 22–32)
Calcium: 8.6 mg/dL — ABNORMAL LOW (ref 8.9–10.3)
Chloride: 104 mmol/L (ref 98–111)
Creatinine, Ser: 0.45 mg/dL (ref 0.44–1.00)
GFR, Estimated: >60 mL/min (ref >60)
Glucose, Bld: 112 mg/dL — ABNORMAL HIGH (ref 70–99)
Potassium: 3.4 mmol/L — ABNORMAL LOW (ref 3.5–5.1)
Sodium: 137 mmol/L (ref 135–145)

## 2020-11-12 LAB — CBC
HCT: 31.6 % — ABNORMAL LOW (ref 36.0–46.0)
Hemoglobin: 9.4 g/dL — ABNORMAL LOW (ref 12.0–15.0)
MCH: 23.9 pg — ABNORMAL LOW (ref 26.0–34.0)
MCHC: 29.7 g/dL — ABNORMAL LOW (ref 30.0–36.0)
MCV: 80.2 fL (ref 80.0–100.0)
Platelets: 424 10*3/uL — ABNORMAL HIGH (ref 150–400)
RBC: 3.94 MIL/uL (ref 3.87–5.11)
RDW: 25.1 % — ABNORMAL HIGH (ref 11.5–15.5)
WBC: 13.1 10*3/uL — ABNORMAL HIGH (ref 4.0–10.5)
nRBC: 0 % (ref 0.0–0.2)

## 2020-11-12 LAB — APTT
aPTT: 32 seconds (ref 24–36)
aPTT: 40 seconds — ABNORMAL HIGH (ref 24–36)

## 2020-11-12 LAB — PHOSPHORUS: Phosphorus: 1.2 mg/dL — ABNORMAL LOW (ref 2.5–4.6)

## 2020-11-12 LAB — HEPARIN LEVEL (UNFRACTIONATED): Heparin Unfractionated: 0.88 IU/mL — ABNORMAL HIGH (ref 0.30–0.70)

## 2020-11-12 LAB — MAGNESIUM: Magnesium: 2.1 mg/dL (ref 1.7–2.4)

## 2020-11-12 MED ORDER — POTASSIUM PHOSPHATES 15 MMOLE/5ML IV SOLN
30.0000 mmol | Freq: Once | INTRAVENOUS | Status: AC
  Administered 2020-11-12: 30 mmol via INTRAVENOUS
  Filled 2020-11-12: qty 10

## 2020-11-12 MED ORDER — TRAVASOL 10 % IV SOLN
INTRAVENOUS | Status: AC
  Filled 2020-11-12: qty 360

## 2020-11-12 MED ORDER — POTASSIUM CHLORIDE 10 MEQ/100ML IV SOLN: 10.0000 meq | INTRAVENOUS | Status: DC

## 2020-11-12 MED ORDER — HEPARIN BOLUS VIA INFUSION
1500.0000 [IU] | Freq: Once | INTRAVENOUS | Status: AC
  Administered 2020-11-12: 1500 [IU] via INTRAVENOUS
  Filled 2020-11-12: qty 1500

## 2020-11-12 MED ORDER — HEPARIN (PORCINE) 25000 UT/250ML-% IV SOLN
1450.0000 [IU]/h | INTRAVENOUS | Status: DC
  Administered 2020-11-12 (×2): 1050 [IU]/h via INTRAVENOUS
  Filled 2020-11-12 (×3): qty 250

## 2020-11-12 MED ORDER — DILTIAZEM HCL-DEXTROSE 125-5 MG/125ML-% IV SOLN (PREMIX)
5.0000 mg/h | INTRAVENOUS | Status: DC
  Administered 2020-11-12: 5 mg/h via INTRAVENOUS
  Administered 2020-11-13 – 2020-11-14 (×2): 7.5 mg/h via INTRAVENOUS
  Filled 2020-11-12 (×5): qty 125

## 2020-11-12 NOTE — Progress Notes (Addendum)
PROGRESS NOTE    Robin [name]  UEA:[id number] DOB: 1934/11/13 DOA: 10/26/2020 PCP: [name], PA-C   No chief complaint on file.  Brief Narrative:  86 year old F with PMH of A. fib on Eliquis and HTN presented to ED for anemia and shortness of breath, and admitted for blood loss anemia due to GI bleed.  Hgb 8.5 (reportedly 11 in January 2022 per daughter).  Hemoccult positive.  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  Colonoscopy on 5/13 confirmed malignant tumor adjacent to the IC valve. Pathology from proximal mass with invasive adenocarcinoma.  Pathology of polyp from transverse colon and splenic flexure with at least high-grade dysplasia. MRI abdomen with and without contrast without significant finding within the visualized portion of the abdomen. Patient underwent laparoscopic proximal hemicolectomy with reanastomosis on 5/16.  Post op treatment now per surgery, complicated by ileus, which was initially slowly improving but then patient had fever of 1-1.4 early morning of 11/10/2020 with worsening abdominal pain and leukocytosis so repeat CT abdomen pelvis was done which showed with massive small bowel dilatation and gastric distention, possible distal SBO, concern for possible pneumatosis along the duodenum and may be some portal venous gas, no free air or perforation. NG tube replaced 5/26 and patient started on IV zosyn and IV eraxis.  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 MRI as above.  Pathology from proximal mass with invasive adenocarcinoma.  Pathology of polyp from transverse colon and splenic flexure with at least high-grade dysplasia.  -S/p laparoscopic proximal hemicolectomy with reanastomosis by Dr. Johney Maine on 5/16 -General surgery managing -surgical pathology -> invasive moderately differentiated adenocarcinoma with mucinous features, tumor invades muscularis propia, margins of resction not involved, 21 LN's negative for carcinoma.  - Dr. Johney Maine planning for patient to follow up with tumor board - Will need outpatient oncology follow up  Nausea  Vomiting  Ileus  - plain film with dilated small bowel loobs concerning for distal small bowel obstruction or ileus - NGT d/c'd 5/21, diet was advanced but then de-escalated to n.p.o. due to following changes.  Patient had fever of 101.4 early this morning with tachycardia and now rising leukocytosis as well as worsening abdominal pain.  Abdominal x-ray showed distal small bowel obstruction. CT 5/26 with massive small bowel dilatation and gastric distention, possible distal SBO, concern for possible pneumatosis along the duodenum and may be some portal venous gas, no free air or perforation. NG tube replaced 5/26 and patient started on IV zosyn and IV eraxis.  Patient has improved today with 2 bowel movements in the last 12 hours.  Abdomen less distended with no pain.  NG tube clamped and started on clears by general surgery and she remains on TPN as well.  Management per general surgery.  Diarrhea - negative c diff studies.  Imodium held.  Severe iron deficiency anemia due to acute on chronic blood loss from GI bleed: FOBT positive.  Hgb 13.7 in 2019, and reportedly about 12 in January 2022 per daughter. -  Hb relatively stable today, follow - labs c/w iron def anemia, low normal B12 (follow MMA - pending)  Paroxysmal A. Fib with RVR: On Toprol-XL, Cardizem and Eliquis at home.   CHA2DS2-VASc score 4 (age, sex, HTN) -Beta-blocker switched to Lopressor 5 mg IV every 6 hours and was also switched to IV heparin due to n.p.o. status.  Rates slightly elevated between 110-130 and she complains of mild shortness of breath but no chest pain.  We will start her on low-dose Cardizem to keep her rates controlled.  Lungs clear to auscultation.  She is not hypoxic.  Essential hypertension: Normotensive. Continue IV Lopressor.  Hypokalemia/hypophosphatemia/Hypomagnesemia: Phosphorus and potassium low again, will replace.  Hyperglycemia -Hemoglobin A1c within normal range.  DVT prophylaxis: Heparin drip Code Status: full Family Communication: None present at bedside.  Disposition:   Status is: Inpatient  Remains inpatient appropriate because:Inpatient level of care appropriate due to severity of illness   Dispo: The patient is from: Home              Anticipated d/c is to: Home with home health              Patient currently is not medically stable to d/c.   Difficult to place patient No       Consultants:   Surgery  GI  Procedures:  5/16 PROCEDURE:   LAPAROSCOPIC PROXIMAL HEMICOLECTOMY  OMENTOPEXY OF ANASTOMOSIS LAPAROSCOPIC LYSIS OF ADHESIONS TRANSVERSUS ABDOMINIS PLANE (TAP) BLOCK - BILATERAL  Colonoscopy - Obvious malignant tumor adjacent to the IC valve. Biopsied. - Several polyps were also removed (mixed snare cautery and cold snare). The largest was located at the presumed splenic flexure, removed piecemeal, and although it was soft and not overtly malignant I labled the site with submucosal Spot injections given it's size (2.5cm). - Return patient to hospital ward for ongoing care. - Will need staging tests with CT scan chest, abd, pelvis. CEA. - I recommend continuing to hold eliquis for now. Await pathology results. - General surgery has already been consulted. - [facility] will return to see her on Monday. Drs. Collene Mares and Benson Norway are covering  this weekend if needed.  Antimicrobials: Anti-infectives (From admission, onward)   Start     Dose/Rate Route Frequency Ordered Stop   11/11/20 1800  anidulafungin (ERAXIS) 100 mg in sodium chloride 0.9 % 100 mL IVPB       "Followed by" Linked Group Details   100 mg 78 mL/hr over 100 Minutes Intravenous Every 24 hours 11/10/20 1622 11/15/20 1759   11/10/20 2200  piperacillin-tazobactam (ZOSYN) IVPB 3.375 g        3.375 g 12.5 mL/hr over 240 Minutes Intravenous Every 8 hours 11/10/20 1610     11/10/20 1800  anidulafungin (ERAXIS) 200 mg in sodium chloride 0.9 % 200 mL IVPB       "Followed by" Linked Group Details   200 mg 78 mL/hr over 200 Minutes Intravenous  Once 11/10/20 1622 11/11/20 0016   11/10/20 1700  anidulafungin (ERAXIS) 100 mg in sodium chloride 0.9 % 100 mL IVPB  Status:  Discontinued        100 mg 78 mL/hr over 100 Minutes Intravenous Every 24 hours 11/10/20 1610 11/10/20 1621   11/10/20 1700  piperacillin-tazobactam (ZOSYN) IVPB 3.375 g        3.375 g 100 mL/hr over 30 Minutes Intravenous  Once 11/10/20 1618 11/10/20 1720   11/08/20 0045  vancomycin (VANCOCIN) 50 mg/mL oral solution 125 mg  Status:  Discontinued  125 mg Oral Every 6 hours 11/07/20 2354 11/08/20 1304   10/31/20 2200  cefoTEtan (CEFOTAN) 2 g in sodium chloride 0.9 % 100 mL IVPB        2 g 200 mL/hr over 30 Minutes Intravenous Every 12 hours 10/31/20 1744 10/31/20 2205   10/31/20 0915  cefoTEtan (CEFOTAN) 2 g in sodium chloride 0.9 % 100 mL IVPB        2 g 200 mL/hr over 30 Minutes Intravenous On call to O.R. 10/31/20 7078 10/31/20 1312        Subjective: Seen and examined, overall feels [name], has had 2 small bowel movements in the last 2 Lotprosin passing flatus.  No abdominal pain.  No other complaint except mild shortness of breath.  Objective: Vitals:   11/11/20 2205 11/12/20 0000 11/12/20 0400 11/12/20 0829  BP:   (!) 160/98 136/69  Pulse: 75 (!) 103 (!) 117 (!) 119  Resp: 20   (!)  24  Temp:  98 F (36.7 C) 98.1 F (36.7 C) 98.8 F (37.1 C)  TempSrc:  Oral Oral Oral  SpO2:  90% 91% 92%  Weight:      Height:        Intake/Output Summary (Last 24 hours) at 11/12/2020 1116 Last data filed at 11/11/2020 1906 Gross per 24 hour  Intake --  Output 350 ml  Net -350 ml   Filed Weights   11/10/20 0400 11/10/20 1651 11/11/20 0400  Weight: 59.9 kg 69.8 kg 62.1 kg    Examination:  General exam: Appears calm and comfortable  Respiratory system: Clear to auscultation. Respiratory effort normal. Cardiovascular system: S1 & S2 heard, irregularly irregular rate and rhythm. No JVD, murmurs, rubs, gallops or clicks. No pedal edema. Gastrointestinal system: Abdomen is mildly distended but, soft and nontender. No organomegaly or masses felt. Normal bowel sounds heard. Central nervous system: Alert and oriented. No focal neurological deficits. Extremities: Symmetric 5 x 5 power. Skin: No rashes, lesions or ulcers.  Psychiatry: Judgement and insight appear normal. Mood & affect appropriate.    Data Reviewed: I have personally reviewed following labs and imaging studies  CBC: Recent Labs  Lab 11/08/20 0427 11/09/20 0652 11/10/20 0438 11/11/20 0529 11/12/20 0710  WBC 14.0* 12.6* 23.0* 17.0* 13.1*  NEUTROABS 11.7* 10.4* 21.7* 15.7*  --   HGB 11.1* 11.2* 12.0 11.4* 9.4*  HCT 38.9 38.2 40.3 37.8 31.6*  MCV 82.8 80.9 79.3* 79.4* 80.2  PLT 370 402* 455* 441* 424*    Basic Metabolic Panel: Recent Labs  Lab 11/08/20 0427 11/09/20 0447 11/10/20 0438 11/11/20 0529 11/12/20 0710  NA 135 137 136 136 137  K 3.1* 3.6 3.2* 3.7 3.4*  CL 103 106 100 101 104  CO2 24 25 27 27 27   GLUCOSE 98 115* 132* 125* 112*  BUN 22 20 22  37* 28*  CREATININE 0.56 0.45 0.56 0.86 0.45  CALCIUM 8.9 8.9 9.3 9.1 8.6*  MG 1.9 1.8 1.6* 2.1 2.1  PHOS 2.5 2.0* 2.8 3.4 1.2*    GFR: Estimated Creatinine Clearance: 46 mL/min (by C-G formula based on SCr of 0.45 mg/dL).  Liver Function  Tests: Recent Labs  Lab 11/06/20 0521 11/07/20 0508 11/08/20 0427 11/09/20 0447 11/11/20 0529  AST 15 24 18  12* 13*  ALT 23 30 28 21 17   ALKPHOS 114 105 120 105 101  BILITOT 1.7* 1.3* 1.7* 1.1 1.9*  PROT 5.4* 5.2* 5.6* 5.2* 5.6*  ALBUMIN 2.7* 2.5* 2.7* 2.6* 2.6*    CBG: Recent Labs  Lab 11/11/20 1303 11/11/20 1702 11/11/20 2214 11/12/20 0827  GLUCAP 95 105* 128* 153*     Recent Results (from the past 240 hour(s))  C Difficile Quick Screen (NO PCR Reflex)     Status: None   Collection Time: 11/07/20  2:01 PM   Specimen: STOOL  Result Value Ref Range Status   C Diff antigen NEGATIVE NEGATIVE Final   C Diff toxin NEGATIVE NEGATIVE Final   C Diff interpretation No C. difficile detected.  Final    Comment: Performed at [facility], [place] [address] [place], [place] [address]  Culture, blood (routine x 2)     Status: None (Preliminary result)   Collection Time: 11/10/20  8:17 AM   Specimen: BLOOD  Result Value Ref Range Status   Specimen Description   Final    BLOOD BLOOD RIGHT FOREARM Performed at [facility] [address] [place], [place] [address]    Special Requests   Final    BOTTLES DRAWN AEROBIC AND ANAEROBIC Blood Culture adequate volume Performed at [facility] [address], [place], [place] [address]    Culture   Final    NO GROWTH 2 DAYS Performed at [facility] [address] [place], [place] [address]    Report Status PENDING  Incomplete  Culture, blood (routine x 2)     Status: None (Preliminary result)   Collection Time: 11/10/20  8:21 AM   Specimen: BLOOD  Result Value Ref Range Status   Specimen Description   Final    BLOOD BLOOD LEFT FOREARM Performed at [facility] [address], [place], [place] [address]    Special Requests   Final    BOTTLES DRAWN AEROBIC AND ANAEROBIC Blood Culture adequate volume Performed at [facility] [address], [place], [place] [address]    Culture   Final    NO GROWTH 2 DAYS Performed at [facility] [address] [place], [place] [address]    Report Status PENDING  Incomplete         Radiology Studies: CT ABDOMEN PELVIS WO CONTRAST  Addendum Date: 11/10/2020   ADDENDUM REPORT: 11/10/2020 16:01 ADDENDUM: Critical Value/emergent results were called by telephone at the time of interpretation on 11/10/2020 at 4:01 pm to provider [facility] , who verbally acknowledged these results. Electronically Signed   By: Marijo Conception M.D.   On: 11/10/2020 16:01   Result Date: 11/10/2020 CLINICAL DATA:  Abdominal distension, vomiting. Status post colon resection. EXAM: CT ABDOMEN AND PELVIS WITHOUT CONTRAST TECHNIQUE: Multidetector CT imaging of the abdomen and pelvis was performed following the standard protocol without IV contrast. COMPARISON:  Oct 28, 2020. FINDINGS: Lower chest: Left basilar subsegmental atelectasis or infiltrate is noted. Hepatobiliary: There appears to be left intrahepatic portal venous gas. Status post cholecystectomy. No biliary dilatation. Pancreas: Unremarkable. No pancreatic ductal dilatation or surrounding inflammatory changes. Spleen: Normal in size without focal abnormality. Adrenals/Urinary Tract: Adrenal glands appear normal. Stable large right renal cyst is noted. No hydronephrosis or renal obstruction is noted. No renal or ureteral calculi are noted. Urinary bladder is unremarkable. Stomach/Bowel: Status post right colectomy. Moderate gastric distention is noted as well as severe small bowel dilatation concerning for distal small bowel obstruction. There appears to be intramural gas involving portions of the dilated small bowel concerning for possible ischemic bowel. Vascular/Lymphatic: Aortic atherosclerosis. No enlarged abdominal or pelvic lymph nodes. Reproductive: Status post hysterectomy.  No adnexal masses. Other: No abdominal wall hernia or  abnormality. No abdominopelvic ascites. Musculoskeletal: No acute or significant osseous findings. IMPRESSION: Severely dilated small bowel loops are noted as well as moderate gastric distension concerning for distal small bowel obstruction. There also appears to be intramural gas involving portions of the dilated small bowel concerning for ischemic bowel. Also noted is a small amount of left intrahepatic portal venous gas, also suggesting ischemic bowel. Left basilar subsegmental atelectasis or infiltrate is noted. Status post right colectomy. Aortic Atherosclerosis (ICD10-I70.0). Electronically Signed: By: Marijo Conception M.D. On: 11/10/2020 15:54   DG Abd 1 View  Result Date: 11/11/2020 CLINICAL DATA:  Check gastric catheter placement EXAM: ABDOMEN - 1 VIEW COMPARISON:  11/10/2020 FINDINGS: Gastric catheter is been further advanced into the stomach and is coiled within the stomach. Right-sided PICC line is noted in satisfactory position. Cardiac shadow is stable. Stable airspace opacities in the bases similar to that noted on the prior exam. IMPRESSION: Gastric catheter coiled within the stomach. Electronically Signed   By: Inez Catalina M.D.   On: 11/11/2020 15:23   DG Abd 1 View  Result Date: 11/10/2020 CLINICAL DATA:  NG tube placement. EXAM: ABDOMEN - 1 VIEW COMPARISON:  Earlier same day FINDINGS: 2101 hours. NG tube tip is in the mid stomach. Gaseous distention of bowel noted in the visualized upper abdomen. IMPRESSION: NG tube tip is in the mid stomach. Electronically Signed   By: Misty Stanley M.D.   On: 11/10/2020 21:32   DG CHEST PORT 1 VIEW  Result Date: 11/11/2020 CLINICAL DATA:  Status post PICC line placement EXAM: PORTABLE CHEST 1 VIEW COMPARISON:  10/26/2020 chest radiograph and 10/28/2020 CT FINDINGS: Right-sided PICC line terminates at the low SVC. Midline trachea. Mild cardiomegaly. Atherosclerosis in the transverse aorta. No pleural effusion or pneumothorax. Diffuse interstitial  thickening. Suspect developing more confluent inferior right upper and bibasilar patchy airspace opacities. IMPRESSION: Right-sided PICC line tip at low SVC, without pneumothorax. Suspicion of developing pulmonary opacities, superimposed upon chronic interstitial thickening. Correlate with infectious symptoms to exclude early pneumonia. Aortic Atherosclerosis (ICD10-I70.0). Electronically Signed   By: Abigail Miyamoto M.D.   On: 11/11/2020 10:55   [place] EKG SITE RITE  Result Date: 11/10/2020 If Site Rite image not attached, placement could not be confirmed due to current cardiac rhythm.       Scheduled Meds: . Chlorhexidine Gluconate Cloth  6 each Topical Daily  . feeding supplement  1 Container Oral TID BM  . insulin aspart  0-9 Units Subcutaneous Q8H  . lip balm  1 application Topical BID  . liver oil-zinc oxide   Topical BID  . metoprolol tartrate  5 mg Intravenous Q6H  . pantoprazole (PROTONIX) IV  40 mg Intravenous Q12H  . sodium chloride flush  10-40 mL Intracatheter Q12H   Continuous Infusions: . anidulafungin    . diltiazem (CARDIZEM) infusion    . heparin 1,050 Units/hr (11/12/20 1042)  . lactated ringers    . methocarbamol (ROBAXIN) IV    . piperacillin-tazobactam (ZOSYN)  IV 3.375 g (11/12/20 0421)  . TPN ADULT (ION) 30 mL/hr at 11/11/20 1810     LOS: 16 days    Time spent: 28 minutes  Darliss Cheney, MD [facility]   To contact the attending provider between 7A-7P or the covering provider during after hours 7P-7A, please log into the web site [email or web address] and access using universal [place] password for that web site. If you do not have the  password, please call the hospital operator.  11/12/2020, 11:16 AM

## 2020-11-12 NOTE — Progress Notes (Signed)
Pharmacy - IV heparin  Assessment:    Please see note from Gretta Arab, PharmD earlier today for full details.  Briefly, 86 y.o. female on IV heparin for Afib   Repeat aPTT remains subtherapeutic and virtually unchanged despite multiple rate increases  No infusion or bleeding issues per RN; no heparin pauses  Plan:   Increase heparin to 1300 units/hr  Daily CBC, heparin level, and aPTT  Reuel Boom, PharmD, BCPS [phone] 11/12/2020, 7:03 PM

## 2020-11-12 NOTE — Progress Notes (Signed)
12 Days Post-Op   Subjective/Chief Complaint: Patient had several loose bowel movements yesterday Feels like her abdomen is much less distended No nausea WBC continues to decrease   Objective: Vital signs in last 24 hours: Temp:  [97.6 F (36.4 C)-98.8 F (37.1 C)] 98.8 F (37.1 C) (05/28 0829) Pulse Rate:  [75-119] 119 (05/28 0829) Resp:  [16-24] 24 (05/28 0829) BP: (117-160)/(66-98) 136/69 (05/28 0829) SpO2:  [90 %-97 %] 92 % (05/28 0829) Last BM Date: 11/11/20  Intake/Output from previous day: 05/27 0701 - 05/28 0700 In: -  Out: 351 [Emesis/NG output:350; Stool:1] Intake/Output this shift: No intake/output data recorded.  Gen: Alert, NAD, pleasant Cardio: HR upper 90's Pulm: decreased breath sounds bilateral bases, no wheezing or rhonchi, rate and effort normalon 2L [place] LEX:NTZG,YFVCBSW distension, nontender,incisions cdi without erythema or drainage  Lab Results:  Recent Labs    11/11/20 0529 11/12/20 0710  WBC 17.0* 13.1*  HGB 11.4* 9.4*  HCT 37.8 31.6*  PLT 441* 424*   BMET Recent Labs    11/10/20 0438 11/11/20 0529  NA 136 136  K 3.2* 3.7  CL 100 101  CO2 27 27  GLUCOSE 132* 125*  BUN 22 37*  CREATININE 0.56 0.86  CALCIUM 9.3 9.1   PT/INR No results for input(s): LABPROT, INR in the last 72 hours. ABG No results for input(s): PHART, HCO3 in the last 72 hours.  Invalid input(s): PCO2, PO2  Studies/Results: CT ABDOMEN PELVIS WO CONTRAST  Addendum Date: 11/10/2020   ADDENDUM REPORT: 11/10/2020 16:01 ADDENDUM: Critical Value/emergent results were called by telephone at the time of interpretation on 11/10/2020 at 4:01 pm to provider [facility] , who verbally acknowledged these results. Electronically Signed   By: Marijo Conception M.D.   On: 11/10/2020 16:01   Result Date: 11/10/2020 CLINICAL DATA:  Abdominal distension, vomiting. Status post colon resection. EXAM: CT ABDOMEN AND PELVIS WITHOUT CONTRAST TECHNIQUE: Multidetector CT imaging of  the abdomen and pelvis was performed following the standard protocol without IV contrast. COMPARISON:  Oct 28, 2020. FINDINGS: Lower chest: Left basilar subsegmental atelectasis or infiltrate is noted. Hepatobiliary: There appears to be left intrahepatic portal venous gas. Status post cholecystectomy. No biliary dilatation. Pancreas: Unremarkable. No pancreatic ductal dilatation or surrounding inflammatory changes. Spleen: Normal in size without focal abnormality. Adrenals/Urinary Tract: Adrenal glands appear normal. Stable large right renal cyst is noted. No hydronephrosis or renal obstruction is noted. No renal or ureteral calculi are noted. Urinary bladder is unremarkable. Stomach/Bowel: Status post right colectomy. Moderate gastric distention is noted as well as severe small bowel dilatation concerning for distal small bowel obstruction. There appears to be intramural gas involving portions of the dilated small bowel concerning for possible ischemic bowel. Vascular/Lymphatic: Aortic atherosclerosis. No enlarged abdominal or pelvic lymph nodes. Reproductive: Status post hysterectomy. No adnexal masses. Other: No abdominal wall hernia or abnormality. No abdominopelvic ascites. Musculoskeletal: No acute or significant osseous findings. IMPRESSION: Severely dilated small bowel loops are noted as well as moderate gastric distension concerning for distal small bowel obstruction. There also appears to be intramural gas involving portions of the dilated small bowel concerning for ischemic bowel. Also noted is a small amount of left intrahepatic portal venous gas, also suggesting ischemic bowel. Left basilar subsegmental atelectasis or infiltrate is noted. Status post right colectomy. Aortic Atherosclerosis (ICD10-I70.0). Electronically Signed: By: Marijo Conception M.D. On: 11/10/2020 15:54   DG Abd 1 View  Result Date: 11/11/2020 CLINICAL DATA:  Check gastric catheter placement EXAM: ABDOMEN -  1 VIEW COMPARISON:   11/10/2020 FINDINGS: Gastric catheter is been further advanced into the stomach and is coiled within the stomach. Right-sided PICC line is noted in satisfactory position. Cardiac shadow is stable. Stable airspace opacities in the bases similar to that noted on the prior exam. IMPRESSION: Gastric catheter coiled within the stomach. Electronically Signed   By: Inez Catalina M.D.   On: 11/11/2020 15:23   DG Abd 1 View  Result Date: 11/10/2020 CLINICAL DATA:  NG tube placement. EXAM: ABDOMEN - 1 VIEW COMPARISON:  Earlier same day FINDINGS: 2101 hours. NG tube tip is in the mid stomach. Gaseous distention of bowel noted in the visualized upper abdomen. IMPRESSION: NG tube tip is in the mid stomach. Electronically Signed   By: Misty Stanley M.D.   On: 11/10/2020 21:32   DG CHEST PORT 1 VIEW  Result Date: 11/11/2020 CLINICAL DATA:  Status post PICC line placement EXAM: PORTABLE CHEST 1 VIEW COMPARISON:  10/26/2020 chest radiograph and 10/28/2020 CT FINDINGS: Right-sided PICC line terminates at the low SVC. Midline trachea. Mild cardiomegaly. Atherosclerosis in the transverse aorta. No pleural effusion or pneumothorax. Diffuse interstitial thickening. Suspect developing more confluent inferior right upper and bibasilar patchy airspace opacities. IMPRESSION: Right-sided PICC line tip at low SVC, without pneumothorax. Suspicion of developing pulmonary opacities, superimposed upon chronic interstitial thickening. Correlate with infectious symptoms to exclude early pneumonia. Aortic Atherosclerosis (ICD10-I70.0). Electronically Signed   By: Abigail Miyamoto M.D.   On: 11/11/2020 10:55   [place] EKG SITE RITE  Result Date: 11/10/2020 If Site Rite image not attached, placement could not be confirmed due to current cardiac rhythm.   Anti-infectives: Anti-infectives (From admission, onward)   Start     Dose/Rate Route Frequency Ordered Stop   11/11/20 1800  anidulafungin (ERAXIS) 100 mg in sodium chloride 0.9 % 100 mL IVPB        "Followed by" Linked Group Details   100 mg 78 mL/hr over 100 Minutes Intravenous Every 24 hours 11/10/20 1622 11/15/20 1759   11/10/20 2200  piperacillin-tazobactam (ZOSYN) IVPB 3.375 g        3.375 g 12.5 mL/hr over 240 Minutes Intravenous Every 8 hours 11/10/20 1610     11/10/20 1800  anidulafungin (ERAXIS) 200 mg in sodium chloride 0.9 % 200 mL IVPB       "Followed by" Linked Group Details   200 mg 78 mL/hr over 200 Minutes Intravenous  Once 11/10/20 1622 11/11/20 0016   11/10/20 1700  anidulafungin (ERAXIS) 100 mg in sodium chloride 0.9 % 100 mL IVPB  Status:  Discontinued        100 mg 78 mL/hr over 100 Minutes Intravenous Every 24 hours 11/10/20 1610 11/10/20 1621   11/10/20 1700  piperacillin-tazobactam (ZOSYN) IVPB 3.375 g        3.375 g 100 mL/hr over 30 Minutes Intravenous  Once 11/10/20 1618 11/10/20 1720   11/08/20 0045  vancomycin (VANCOCIN) 50 mg/mL oral solution 125 mg  Status:  Discontinued        125 mg Oral Every 6 hours 11/07/20 2354 11/08/20 1304   10/31/20 2200  cefoTEtan (CEFOTAN) 2 g in sodium chloride 0.9 % 100 mL IVPB        2 g 200 mL/hr over 30 Minutes Intravenous Every 12 hours 10/31/20 1744 10/31/20 2205   10/31/20 0915  cefoTEtan (CEFOTAN) 2 g in sodium chloride 0.9 % 100 mL IVPB        2 g 200 mL/hr over 30 Minutes Intravenous  On call to O.R. 10/31/20 0824 10/31/20 1312      Assessment/Plan: Atrial fibrillation- on toprol and cardizem, eliquis stopped 5/26 - ok for IV heparin if indicated per TRH HTN HLD Diarrhea - c diffnegative5/23 Severe malnutrition - prealbumin 5.7 (5/27), starting TPN --per TRH--  POD#12 -s/p lap assisted right hemicolectomy for ascending colon mass (adenocarcinoma) and multiple polyps (likely pre-malignant), Dr. Johney Maine 10/31/20 - surgical path:Invasive moderately differentiated adenocarcinoma with mucinous  features, 4 cm; tumor invades the muscularis propria; margins of resection are not involved; twenty-one lymph  nodes, negative for carcinoma (0/21). - CT 5/26 with massive small bowel dilatation and gastric distention, possible distal SBO, concern for possible pneumatosis along the duodenum and may be some portal venous gas, no free air or perforation - NG tube replaced 5/26 and patient started on IV zosyn and IV eraxis  Acute blood loss anemia - Hgb down since Heparin restarted.  Monitor  FEN -IVF, Begin clears, clamp NG tube, TPN VTE -lovenox ID -Cefotetanpre-op, PO vancomycin 5/24>>5/24, zosyn 5/26>>, eraxis 5/26>>  Plan: Clinically much better today. Abdominal exam benign and WBC trending down. Clamp NG tube.  Clear liquids today.  Continue abx as above. Mobilize as able. Ok for IV heparin if indicated per TRH. TNA has been ordered and should start today.  LOS: 16 days    Maia Petties 11/12/2020

## 2020-11-12 NOTE — Progress Notes (Signed)
Pt with increased WOB, dropping to low 80's on room air when up to bedside commode. 2LO2 applied. Also with new 3+ pitting edema to L lateral abdomen as well as upper hip/thigh. MD Pahwani made aware and CXR ordered. Will continue to monitor patient.

## 2020-11-12 NOTE — Progress Notes (Signed)
[place] for IV heparin Indication: atrial fibrillation  Allergies  Allergen Reactions  . Iodinated Diagnostic Agents Shortness Of Breath  . Amlodipine     Shortness of breath  . Codeine Nausea Only  . Diphenhydramine Other (See Comments)    hypertension  . Diphenhydramine Hcl     Hypertension   . Pravastatin     Muscle aches  . Zetia [Ezetimibe]   . Sulfa Antibiotics Rash  . Sulfa Drugs Cross Reactors Rash    Patient Measurements: Height: 5\' 4"  (162.6 cm) Weight: 62.1 kg (136 lb 14.5 oz) IBW/kg (Calculated) : 54.7 Heparin Dosing Weight: TBW  Vital Signs: Temp: 98.1 F (36.7 C) (05/28 0400) Temp Source: Oral (05/28 0400) BP: 160/98 (05/28 0400) Pulse Rate: 117 (05/28 0400)  Labs: Recent Labs    11/10/20 0438 11/11/20 0529 11/11/20 0911 11/11/20 1816 11/12/20 0710  HGB 12.0 11.4*  --   --  9.4*  HCT 40.3 37.8  --   --  31.6*  PLT 455* 441*  --   --  424*  APTT  --   --  35 35 32  HEPARINUNFRC  --   --  >1.10*  --  0.88*  CREATININE 0.56 0.86  --   --   --     Estimated Creatinine Clearance: 42.8 mL/min (by C-G formula based on SCr of 0.86 mg/dL).   Medications:  . anidulafungin    . heparin    . lactated ringers    . methocarbamol (ROBAXIN) IV    . piperacillin-tazobactam (ZOSYN)  IV 3.375 g (11/12/20 0421)  . TPN ADULT (ION) 30 mL/hr at 11/11/20 1810    Assessment: 32 yoF with PMH Afib on Eliquis, s/p hemicolectomy 5/16 for colon adenocarcinoma; now with severe SBO and possible need for imminent surgical intervention. Pharmacy consulted to dose IV heparin infusion.  Prior anticoagulation: Eliquis 5 mg BID; LD 5/26 AM; LMWH 40 mg 5/26 PM after Eliquis held  Today, 11/12/2020:  PTT = 32 (subtherapeutic) on heparin 850 units/hr  RN reports no interruptions or pauses reported from overnight shift.    Heparin level 0.88 is supratherapeutic, but falsely elevated d/t recent DOAC  No bleeding or infusion issues  documented  CBC:  Hgb down to 9.4, Plt elevated.  Goal of Therapy: PTT 66-102 seconds Heparin level 0.3-0.7 units/ml Monitor platelets by anticoagulation protocol: Yes  Plan:  Heparin 1500 units IV bolus, increase heparin drip to 1050 units/hr  Check aPTT 8hr after rate increase  Daily CBC, daily heparin level once stable  Monitor for signs of bleeding or thrombosis   Gretta Arab PharmD, BCPS Clinical Pharmacist [facility] main pharmacy [phone] 11/12/2020 8:08 AM

## 2020-11-12 NOTE — Progress Notes (Signed)
PHARMACY - TOTAL PARENTERAL NUTRITION CONSULT NOTE   Indication: Small bowel obstruction; anticipate prolonged NPO status  Patient Measurements: Height: 5\' 4"  (162.6 cm) Weight: 62.1 kg (136 lb 14.5 oz) IBW/kg (Calculated) : 54.7 TPN AdjBW (KG): 65.4 Body mass index is 23.5 kg/m. Usual Weight: 66 kg (~10 lb weight loss this admission)  Assessment:  67 yoF with PMH Afib admitted 5/11 with anemia; found to have bleeding colonic mass suspicious for malignancy and underwent hemicolectomy 5/16. Developed ileus POD3 which initially improved, then worsened POD10. CT showed massive SBO and no immediate indication for surgery, so Pharmacy consulted to begin TPN for expected prolonged ileus.  Glucose / Insulin: no Hx DM; SBGs mostly well controlled after starting TPN in PM - only 1 unit SSI required yesterday Electrolytes: K, Phos lower today, consistent with refeeding; coCa lower but remains WNL; all others stable WNL Renal: SCr, bicarb stable WNL; BUN slightly elevated but improving; UOP not charted Hepatic: (5/27) Tbili elevated consistent with earlier this admission; albumin low/stable; TG and LFTs WNL Prealbumin: significantly low 5/27 I/O: significant (>1L) NG output - no MIVF - eating well prior to admission, but likely some degree of malnutrition even then d/t malignancy; inconsistent diet since admission so will treat as moderate risk for refeeding GI Imaging: - 5/13 colonoscopy: multiple polyps; larger masses at cecum and transverse colon - 5/14 MRI liver: no metastatic dz - 5/18 AXR: dilated SB loops; possible ileus - 5/26 CTa/p: severely dilated SB loops (not including ileocolonic anastomosis) concerning for distal SBO, also intramural and intrahepatic portal venous gas suggesting possible ischemic bowel Surgeries / Procedures:  - 5/16 laparoscopic hemicolectomy/cecectomy; omentopexy of ileocolonic anastomosis  Central access: PICC placed 5/27 TPN start date: 5/27  Nutritional  Goals RD recs pending Kcal:  1700-1900 kcal Protein:  80-95 grams Fluid:  >/= 2 L/day  TPN at goal rate of 75 mL/hr provides 90 g of protein and 1818 kcals per day  Current Nutrition:  NPO, TPN - reattempting clamping & clears today  Plan:   KPhos 30 mmol IV x 1 (provides 45 mEq potassium)  Continue TPN at 30 mL/hr at 1800 given signs of refeeding  Electrolytes in TPN: (standard) - no changes  Na - 46mEq/L  K - 4mEq/L  Ca - 18mEq/L  Mg - 34mEq/L  Phos - 12mmol/L  Cl:Ac ratio 1:1  Add standard MVI and trace elements to TPN  Continue Sensitive q8h SSI and adjust as needed   MIVF per primary  Monitor TPN labs on Mon/Thurs  Bmet, Phos, Mag tomorrow  Reuel Boom, PharmD, BCPS [phone] 11/12/2020, 10:21 AM

## 2020-11-13 ENCOUNTER — Inpatient Hospital Stay (HOSPITAL_COMMUNITY): Payer: Medicare PPO

## 2020-11-13 DIAGNOSIS — C180 Malignant neoplasm of cecum: Secondary | ICD-10-CM | POA: Diagnosis not present

## 2020-11-13 LAB — BASIC METABOLIC PANEL
Anion gap: 5 (ref 5–15)
BUN: 19 mg/dL (ref 8–23)
CO2: 25 mmol/L (ref 22–32)
Calcium: 8.3 mg/dL — ABNORMAL LOW (ref 8.9–10.3)
Chloride: 106 mmol/L (ref 98–111)
Creatinine, Ser: 0.45 mg/dL (ref 0.44–1.00)
GFR, Estimated: >60 mL/min (ref >60)
Glucose, Bld: 135 mg/dL — ABNORMAL HIGH (ref 70–99)
Potassium: 3.5 mmol/L (ref 3.5–5.1)
Sodium: 136 mmol/L (ref 135–145)

## 2020-11-13 LAB — CBC
HCT: 30.2 % — ABNORMAL LOW (ref 36.0–46.0)
Hemoglobin: 9.2 g/dL — ABNORMAL LOW (ref 12.0–15.0)
MCH: 24.5 pg — ABNORMAL LOW (ref 26.0–34.0)
MCHC: 30.5 g/dL (ref 30.0–36.0)
MCV: 80.3 fL (ref 80.0–100.0)
Platelets: 405 10*3/uL — ABNORMAL HIGH (ref 150–400)
RBC: 3.76 MIL/uL — ABNORMAL LOW (ref 3.87–5.11)
RDW: 25.4 % — ABNORMAL HIGH (ref 11.5–15.5)
WBC: 11.8 10*3/uL — ABNORMAL HIGH (ref 4.0–10.5)
nRBC: 0 % (ref 0.0–0.2)

## 2020-11-13 LAB — APTT: aPTT: 38 seconds — ABNORMAL HIGH (ref 24–36)

## 2020-11-13 LAB — HEPARIN LEVEL (UNFRACTIONATED): Heparin Unfractionated: 0.2 IU/mL — ABNORMAL LOW (ref 0.30–0.70)

## 2020-11-13 LAB — PROCALCITONIN: Procalcitonin: 0.42 ng/mL

## 2020-11-13 LAB — MAGNESIUM: Magnesium: 2 mg/dL (ref 1.7–2.4)

## 2020-11-13 LAB — PHOSPHORUS: Phosphorus: 2.3 mg/dL — ABNORMAL LOW (ref 2.5–4.6)

## 2020-11-13 LAB — GLUCOSE, CAPILLARY
Glucose-Capillary: 152 mg/dL — ABNORMAL HIGH (ref 70–99)
Glucose-Capillary: 161 mg/dL — ABNORMAL HIGH (ref 70–99)
Glucose-Capillary: 174 mg/dL — ABNORMAL HIGH (ref 70–99)
Glucose-Capillary: 166 mg/dL — ABNORMAL HIGH (ref 70–99)

## 2020-11-13 MED ORDER — FUROSEMIDE 10 MG/ML IJ SOLN
40.0000 mg | Freq: Once | INTRAMUSCULAR | Status: AC
  Administered 2020-11-13: 40 mg via INTRAVENOUS
  Filled 2020-11-13: qty 4

## 2020-11-13 MED ORDER — POTASSIUM PHOSPHATES 15 MMOLE/5ML IV SOLN
30.0000 mmol | Freq: Once | INTRAVENOUS | Status: AC
  Administered 2020-11-13: 30 mmol via INTRAVENOUS
  Filled 2020-11-13: qty 10

## 2020-11-13 MED ORDER — HEPARIN BOLUS VIA INFUSION
1500.0000 [IU] | Freq: Once | INTRAVENOUS | Status: AC
  Administered 2020-11-13: 1500 [IU] via INTRAVENOUS
  Filled 2020-11-13: qty 1500

## 2020-11-13 MED ORDER — INSULIN ASPART 100 UNIT/ML IJ SOLN
0.0000 [IU] | Freq: Three times a day (TID) | INTRAMUSCULAR | Status: DC
  Administered 2020-11-13 – 2020-11-14 (×3): 3 [IU] via SUBCUTANEOUS

## 2020-11-13 MED ORDER — ENOXAPARIN SODIUM 80 MG/0.8ML IJ SOSY
1.0000 mg/kg | PREFILLED_SYRINGE | Freq: Two times a day (BID) | INTRAMUSCULAR | Status: DC
  Administered 2020-11-13 (×2): 65 mg via SUBCUTANEOUS
  Filled 2020-11-13 (×2): qty 0.8

## 2020-11-13 MED ORDER — TRAVASOL 10 % IV SOLN
INTRAVENOUS | Status: AC
  Filled 2020-11-13: qty 720

## 2020-11-13 NOTE — Progress Notes (Signed)
13 Days Post-Op   Subjective/Chief Complaint: Patient had more bowel movements.  She states that one of them was quite large. NG tube has been clamped for one day.  She denies nausea or vomiting Tolerating clear liquids WBC decreasing   Objective: Vital signs in last 24 hours: Temp:  [97.6 F (36.4 C)-98.1 F (36.7 C)] 98.1 F (36.7 C) (05/29 4259) Pulse Rate:  [90-120] 105 (05/29 0800) Resp:  [18-24] 18 (05/29 0642) BP: (137-158)/(59-73) 158/66 (05/29 0800) SpO2:  [94 %-95 %] 94 % (05/29 0800) Weight:  [64.7 kg] 64.7 kg (05/29 0500) Last BM Date: 11/12/20  Intake/Output from previous day: 05/28 0701 - 05/29 0700 In: 528 [P.O.:120; I.V.:178; IV Piggyback:230] Out: 600 [Urine:400; Emesis/NG output:200] Intake/Output this shift: No intake/output data recorded.  General appearance: alert, cooperative and no distress GI: soft, healed incisions, minimal distention, non-tender  Mildly increased work of breathing  Lab Results:  Recent Labs    11/12/20 0710 11/13/20 0300  WBC 13.1* 11.8*  HGB 9.4* 9.2*  HCT 31.6* 30.2*  PLT 424* 405*   BMET Recent Labs    11/12/20 0710 11/13/20 0300  NA 137 136  K 3.4* 3.5  CL 104 106  CO2 27 25  GLUCOSE 112* 135*  BUN 28* 19  CREATININE 0.45 0.45  CALCIUM 8.6* 8.3*   PT/INR No results for input(s): LABPROT, INR in the last 72 hours. ABG No results for input(s): PHART, HCO3 in the last 72 hours.  Invalid input(s): PCO2, PO2  Studies/Results: DG Abd 1 View  Result Date: 11/11/2020 CLINICAL DATA:  Check gastric catheter placement EXAM: ABDOMEN - 1 VIEW COMPARISON:  11/10/2020 FINDINGS: Gastric catheter is been further advanced into the stomach and is coiled within the stomach. Right-sided PICC line is noted in satisfactory position. Cardiac shadow is stable. Stable airspace opacities in the bases similar to that noted on the prior exam. IMPRESSION: Gastric catheter coiled within the stomach. Electronically Signed   By: Inez Catalina M.D.   On: 11/11/2020 15:23   DG CHEST PORT 1 VIEW  Result Date: 11/12/2020 CLINICAL DATA:  86 year old female with increased work of breathing. EXAM: PORTABLE CHEST - 1 VIEW COMPARISON:  11/11/2020 FINDINGS: The mediastinal contours are within normal limits. No cardiomegaly. Slight interval increased conspicuity of hazy, streaky opacities in the right upper lobe. Similar appearing patchy, hazy opacities in the right lower lobe. Unchanged lingular opacities. No evidence of significant pleural effusion. No pneumothorax. Right upper extremity PICC terminates at the cavoatrial junction, unchanged. Enteric feeding tube courses off the inferior aspect of this image, however the tip is visualized in the gastric fundus. Similar-appearing atherosclerotic calcifications of the aortic arch. No acute osseous abnormality. IMPRESSION: Slight interval increased conspicuity of previously visualized right upper and lower lobe hazy pulmonary opacities. These findings could be seen with developing multifocal pneumonia, asymmetric pulmonary edema, or postobstructive atelectasis. Electronically Signed   By: Ruthann Cancer MD   On: 11/12/2020 14:12   DG CHEST PORT 1 VIEW  Result Date: 11/11/2020 CLINICAL DATA:  Status post PICC line placement EXAM: PORTABLE CHEST 1 VIEW COMPARISON:  10/26/2020 chest radiograph and 10/28/2020 CT FINDINGS: Right-sided PICC line terminates at the low SVC. Midline trachea. Mild cardiomegaly. Atherosclerosis in the transverse aorta. No pleural effusion or pneumothorax. Diffuse interstitial thickening. Suspect developing more confluent inferior right upper and bibasilar patchy airspace opacities. IMPRESSION: Right-sided PICC line tip at low SVC, without pneumothorax. Suspicion of developing pulmonary opacities, superimposed upon chronic interstitial thickening. Correlate with infectious symptoms  to exclude early pneumonia. Aortic Atherosclerosis (ICD10-I70.0). Electronically Signed   By: Abigail Miyamoto M.D.   On: 11/11/2020 10:55    Anti-infectives: Anti-infectives (From admission, onward)   Start     Dose/Rate Route Frequency Ordered Stop   11/11/20 1800  anidulafungin (ERAXIS) 100 mg in sodium chloride 0.9 % 100 mL IVPB       "Followed by" Linked Group Details   100 mg 78 mL/hr over 100 Minutes Intravenous Every 24 hours 11/10/20 1622 11/15/20 1759   11/10/20 2200  piperacillin-tazobactam (ZOSYN) IVPB 3.375 g        3.375 g 12.5 mL/hr over 240 Minutes Intravenous Every 8 hours 11/10/20 1610     11/10/20 1800  anidulafungin (ERAXIS) 200 mg in sodium chloride 0.9 % 200 mL IVPB       "Followed by" Linked Group Details   200 mg 78 mL/hr over 200 Minutes Intravenous  Once 11/10/20 1622 11/11/20 0016   11/10/20 1700  anidulafungin (ERAXIS) 100 mg in sodium chloride 0.9 % 100 mL IVPB  Status:  Discontinued        100 mg 78 mL/hr over 100 Minutes Intravenous Every 24 hours 11/10/20 1610 11/10/20 1621   11/10/20 1700  piperacillin-tazobactam (ZOSYN) IVPB 3.375 g        3.375 g 100 mL/hr over 30 Minutes Intravenous  Once 11/10/20 1618 11/10/20 1720   11/08/20 0045  vancomycin (VANCOCIN) 50 mg/mL oral solution 125 mg  Status:  Discontinued        125 mg Oral Every 6 hours 11/07/20 2354 11/08/20 1304   10/31/20 2200  cefoTEtan (CEFOTAN) 2 g in sodium chloride 0.9 % 100 mL IVPB        2 g 200 mL/hr over 30 Minutes Intravenous Every 12 hours 10/31/20 1744 10/31/20 2205   10/31/20 0915  cefoTEtan (CEFOTAN) 2 g in sodium chloride 0.9 % 100 mL IVPB        2 g 200 mL/hr over 30 Minutes Intravenous On call to O.R. 10/31/20 0824 10/31/20 1312      Assessment/Plan: Atrial fibrillation- on toprolandcardizem, eliquis stopped 5/26 - ok for IV heparin if indicated per TRH HTN HLD Diarrhea - c diffnegative5/23 Severe malnutrition - prealbumin 5.7 (5/27), starting TPN --per TRH--  POD#13-s/p lap assisted right hemicolectomy for ascending colon mass (adenocarcinoma) and multiple  polyps (likely pre-malignant), Dr. Johney Maine 10/31/20 - surgical path:Invasive moderately differentiated adenocarcinoma with mucinous  features, 4 cm; tumor invades the muscularis propria; margins of resection are not involved; twenty-one lymph nodes, negative for carcinoma (0/21). - CT 5/26 withmassive small bowel dilatation and gastric distention, possible distal SBO, concern for possible pneumatosis along the duodenum and may be some portal venous gas, no free air or perforation - NG tube replaced 5/26 and patient started on IV zosyn and IV eraxis  Acute blood loss anemia - Hgb down since Heparin restarted.  stable today  FEN -IVF, remove NG tube, full liquids; continue TPN today until we are sure that she is taking adequate PO's VTE -lovenox ID -Cefotetanpre-op, PO vancomycin 5/24>>5/24, zosyn 5/26>>, eraxis 5/26>>  Plan: Clinically much better today. Abdominal exam benign and WBC trending down. D/C NG tube.  Full liquids today.  Continue abx as above. Mobilize as able. Ok for IV heparin if indicated per TRH.   LOS: 17 days    Robin [name] 11/13/2020

## 2020-11-13 NOTE — Progress Notes (Addendum)
Arrived to room, daughter concerned about breathing.RR noted to be increased, but not in any distress, O2 saturation 96-100% on room air. patient endorses, " I am tired." Explained to daughter patient has been getting up and down to the bedside commode, during this shift, increased activity more than usual. Patient denying having any pain needs. Patient now noted to be resting with eyes closed, increased RR at 24-26, placed on 1L for comfort. Daughter remains at bedside. No other needs identified. Made MD Darliss Cheney aware via secure method.Will continue to monitor.    15:04: Paged via amion paging system, to also make MD Hitterdal aware. Awaiting page back.

## 2020-11-13 NOTE — Progress Notes (Addendum)
Shift Summary:    Remained alert and oriented x 4, but forgetful, remains on telemetry in a-fib with rate being 80-to low 100's. Now noted to be sinus on the telemetry monitor. Made MD Pahwani aware just now via secure method. Assisted to bathroom multiple times during this shift. Received all scheduled medicines during this shift. Given 1'x dose of lasix, repeat chest x ray done during this shift, placed back on purewick. RR remained from uppers 20's to low 30's, tachypnea improved towards the end of the shift returning back to lower 20's, ng tube removed during this shift,patient tolerating liquid diet, with no complaint of nausea/vomitting.Heprarin gtt discontinued and transitioned to sub-q Lovenox. Patient is requesting that PRN ativan be given at night to help her sleep. Daughter visited during this shift and would like an update from the MD, will pass this on to night shift to remind the day shift nurse in the morning. No other needs identified. Will continue to monitor.

## 2020-11-13 NOTE — Progress Notes (Signed)
[place] for IV heparin Indication: atrial fibrillation  Allergies  Allergen Reactions  . Iodinated Diagnostic Agents Shortness Of Breath  . Amlodipine     Shortness of breath  . Codeine Nausea Only  . Diphenhydramine Other (See Comments)    hypertension  . Diphenhydramine Hcl     Hypertension   . Pravastatin     Muscle aches  . Zetia [Ezetimibe]   . Sulfa Antibiotics Rash  . Sulfa Drugs Cross Reactors Rash    Patient Measurements: Height: 5\' 4"  (162.6 cm) Weight: 62.1 kg (136 lb 14.5 oz) IBW/kg (Calculated) : 54.7 Heparin Dosing Weight: TBW  Vital Signs: Temp: 97.6 F (36.4 C) (05/28 2149) Temp Source: Oral (05/28 2149) BP: 144/73 (05/29 0226) Pulse Rate: 90 (05/28 2149)  Labs: Recent Labs    11/11/20 0529 11/11/20 0911 11/11/20 1816 11/12/20 0710 11/12/20 1728 11/13/20 0300  HGB 11.4*  --   --  9.4*  --  9.2*  HCT 37.8  --   --  31.6*  --  30.2*  PLT 441*  --   --  424*  --  405*  APTT  --  35   < > 32 40* 38*  HEPARINUNFRC  --  >1.10*  --  0.88*  --  0.20*  CREATININE 0.86  --   --  0.45  --  0.45   < > = values in this interval not displayed.    Estimated Creatinine Clearance: 46 mL/min (by C-G formula based on SCr of 0.45 mg/dL).   Medications:  . anidulafungin 100 mg (11/12/20 1709)  . diltiazem (CARDIZEM) infusion 7.5 mg/hr (11/12/20 1351)  . heparin 1,300 Units/hr (11/12/20 1851)  . methocarbamol (ROBAXIN) IV    . piperacillin-tazobactam (ZOSYN)  IV 3.375 g (11/12/20 2034)  . TPN ADULT (ION) 30 mL/hr at 11/12/20 1736    Assessment: 32 yoF with PMH Afib on Eliquis, s/p hemicolectomy 5/16 for colon adenocarcinoma; now with severe SBO and possible need for imminent surgical intervention. Pharmacy consulted to dose IV heparin infusion.  Prior anticoagulation: Eliquis 5 mg BID; LD 5/26 AM; LMWH 40 mg 5/26 PM after Eliquis held  Today, 11/13/2020:  PTT = 38 (subtherapeutic) on heparin 1300 units/hr despite  multiple rate increases  RN reports no interruptions or pauses , no bleeding   Heparin level  0.2 also subtherapeutic  CBC:  Hgb down to 9.2, Plt elevated.  Goal of Therapy: PTT 66-102 seconds Heparin level 0.3-0.7 units/ml Monitor platelets by anticoagulation protocol: Yes  Plan:  Heparin 1500 units IV bolus, increase heparin drip to 1450 units/hr  Check aPTT and HL 8hr after rate increase  Daily CBC, daily heparin level once stable  Monitor for signs of bleeding or thrombosis   Dolly Rias RPh 11/13/2020, 4:26 AM

## 2020-11-13 NOTE — Progress Notes (Addendum)
PHARMACY - TOTAL PARENTERAL NUTRITION CONSULT NOTE   Indication: Small bowel obstruction; anticipate prolonged NPO status  Patient Measurements: Height: 5\' 4"  (162.6 cm) Weight: 64.7 kg (142 lb 10.2 oz) IBW/kg (Calculated) : 54.7 TPN AdjBW (KG): 65.4 Body mass index is 24.48 kg/m. Usual Weight: 66 kg (~10 lb weight loss this admission)  Assessment:  57 yoF with PMH Afib admitted 5/11 with anemia; found to have bleeding colonic mass suspicious for malignancy and underwent hemicolectomy 5/16. Developed ileus POD3 which initially improved, then worsened POD10. CT showed massive SBO and no immediate indication for surgery, so Pharmacy consulted to begin TPN for expected prolonged ileus.  Glucose / Insulin: no Hx DM; CBGs slightly elevated but stable on half-rate TPN - 5 units sensitive SSI required yesterday Electrolytes: Some evidence of refeeding yesterday; K, Phos improved but remain borderline low; coCa lower but remains WNL; all others stable WNL Renal: SCr, bicarb stable WNL; BUN decreased to WNL; UOP low but per RN difficulties capturing urine volume d/t incontinence and liquid stools; doubt this is accurate Hepatic: (5/27) Tbili elevated consistent with earlier this admission; albumin low/stable; TG and LFTs WNL Prealbumin: significantly low 5/27 I/O: significant improvement in NG output yesterday; tolerated clamping and plan to d/c NGT 5/29 - tolerating clears per Surgery note, although no intake charted; advancing to FLD 5/29 - no MIVF; diltiazem in D5 running at <10 ml/hr - eating well prior to admission, but likely some degree of malnutrition even then d/t malignancy; inconsistent diet since admission so will treat as moderate risk for refeeding GI Imaging: - 5/13 colonoscopy: multiple polyps; larger masses at cecum and transverse colon - 5/14 MRI liver: no metastatic dz - 5/18 AXR: dilated SB loops; possible ileus - 5/26 CTa/p: severely dilated SB loops (not including  ileocolonic anastomosis) concerning for distal SBO, also intramural and intrahepatic portal venous gas suggesting possible ischemic bowel Surgeries / Procedures:  - 5/16 laparoscopic hemicolectomy/cecectomy; omentopexy of ileocolonic anastomosis  Central access: PICC placed 5/27 TPN start date: 5/27  Nutritional Goals RD recs pending Kcal:  1700-1900 kcal Protein:  80-95 grams Fluid:  >/= 2 L/day  TPN at goal rate of 75 mL/hr provides 90 g of protein and 1818 kcals per day  Current Nutrition:  FLD, TPN  Plan:   Repeat KPhos 30 mmol IV x 1 (provides 45 mEq potassium)  Advance TPN to 60 mL/hr at 1800; still some minor signs of refeeding, but trending in the right direction  Electrolytes in TPN: (standard) - no changes  Na - 69mEq/L  K - 99mEq/L  Ca - 61mEq/L  Mg - 36mEq/L  Phos - 77mmol/L  Cl:Ac ratio 1:1  Add standard MVI and trace elements to TPN  Advance SSI to moderate scale and continue q8h CBG checks   MIVF per primary  Monitor TPN labs on Mon/Thurs  Reuel Boom, PharmD, BCPS [phone] 11/13/2020, 8:50 AM

## 2020-11-13 NOTE — Progress Notes (Signed)
PROGRESS NOTE    Robin [name]  PFX:[id number] DOB: [birth year] DOA: 10/26/2020 PCP: [name], PA-C   No chief complaint on file.  Brief Narrative:  86 year old F with PMH of A. fib on Eliquis and HTN presented to ED for anemia and shortness of breath, and admitted for blood loss anemia due to GI bleed.  Hgb 8.5 (reportedly 11 in January 2022 per daughter).  Hemoccult positive.  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  Colonoscopy on 5/13 confirmed malignant tumor adjacent to the IC valve. Pathology from proximal mass with invasive adenocarcinoma.  Pathology of polyp from transverse colon and splenic flexure with at least high-grade dysplasia. MRI abdomen with and without contrast without significant finding within the visualized portion of the abdomen. Patient underwent laparoscopic proximal hemicolectomy with reanastomosis on 5/16.  Post op treatment now per surgery, complicated by ileus, which was initially slowly improving but then patient had fever of 1-1.4 early morning of 11/10/2020 with worsening abdominal pain and leukocytosis so repeat CT abdomen pelvis was done which showed with massive small bowel dilatation and gastric distention, possible distal SBO, concern for possible pneumatosis along the duodenum and may be some portal venous gas, no free air or perforation. NG tube replaced 5/26 and patient started on IV zosyn and IV eraxis.  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 MRI as above.  Pathology from proximal mass with invasive adenocarcinoma.  Pathology of polyp from transverse colon and splenic flexure with at least high-grade dysplasia.  -S/p laparoscopic proximal hemicolectomy with reanastomosis by Dr. Johney Maine on 5/16 -General surgery managing -surgical pathology -> invasive moderately differentiated adenocarcinoma with mucinous features, tumor invades muscularis propia, margins of resction not involved, 21 LN's negative for carcinoma.  - Dr. Johney Maine planning for patient to follow up with tumor board - Will need outpatient oncology follow up  Nausea  Vomiting  Ileus  - plain film with dilated small bowel loobs concerning for distal small bowel obstruction or ileus - NGT d/c'd 5/21, diet was advanced but then de-escalated to n.p.o. due to following changes.  Patient had fever of 101.4 early this morning with tachycardia and now rising leukocytosis as well as worsening abdominal pain.  Abdominal x-ray showed distal small bowel obstruction. CT 5/26 with massive small bowel dilatation and gastric distention, possible distal SBO, concern for possible pneumatosis along the duodenum and may be some portal venous gas, no free air or perforation. NG tube replaced 5/26 and patient started on IV zosyn and IV eraxis.  Patient tells me that she has had several bowel movements since yesterday, one of them was very large.  Tolerating liquid diet.  NG tube has been clamped for 1 day.  Surgery managing and diet is advanced to full liquid diet now.  Continues to be on TPN.  Diarrhea - negative c diff studies.  Imodium held.  Severe iron deficiency anemia due to acute on chronic blood loss from GI bleed: FOBT positive.  Hgb 13.7 in 2019, and reportedly about  12 in January 2022 per daughter. - Hb relatively stable today, follow - labs c/w iron def anemia, low normal B12 (follow MMA - pending)  Paroxysmal A. Fib with RVR: On Toprol-XL, Cardizem and  Eliquis at home.  CHA2DS2-VASc score 4 (age, sex, HTN) -Beta-blocker switched to Lopressor 5 mg IV every 6 hours and was also switched to IV heparin due to n.p.o. status however per pharmacy, they are having hard time maintaining appropriate level of heparin so she is going to be switched to Lovenox today.  Heart rate controlled.  Continue IV Lopressor scheduled and diltiazem drip.  Essential hypertension: Normotensive. Continue IV Lopressor.  Hypokalemia/hypophosphatemia/Hypomagnesemia: Magnesium and potassium are normal.  Phosphate low.  Will replace.  Hyperglycemia -Hemoglobin A1c within normal range.  DVT prophylaxis: Heparin drip Code Status: full Family Communication: None present at bedside.  Disposition:   Status is: Inpatient  Remains inpatient appropriate because:Inpatient level of care appropriate due to severity of illness   Dispo: The patient is from: Home              Anticipated d/c is to: Home with home health              Patient currently is not medically stable to d/c.   Difficult to place patient No       Consultants:   Surgery  GI  Procedures:  5/16 PROCEDURE:   LAPAROSCOPIC PROXIMAL HEMICOLECTOMY  OMENTOPEXY OF ANASTOMOSIS LAPAROSCOPIC LYSIS OF ADHESIONS TRANSVERSUS ABDOMINIS PLANE (TAP) BLOCK - BILATERAL  Colonoscopy - Obvious malignant tumor adjacent to the IC valve. Biopsied. - Several polyps were also removed (mixed snare cautery and cold snare). The largest was located at the presumed splenic flexure, removed piecemeal, and although it was soft and not overtly malignant I labled the site with submucosal Spot injections given it's size (2.5cm). - Return patient to hospital ward for ongoing care. - Will need staging tests with CT scan chest, abd, pelvis. CEA. - I recommend continuing to hold eliquis for now. Await pathology results. - General surgery has already been consulted. - [facility] will return to see her on Monday. Drs. Collene Mares  and Benson Norway are covering this weekend if needed.  Antimicrobials: Anti-infectives (From admission, onward)   Start     Dose/Rate Route Frequency Ordered Stop   11/11/20 1800  anidulafungin (ERAXIS) 100 mg in sodium chloride 0.9 % 100 mL IVPB       "Followed by" Linked Group Details   100 mg 78 mL/hr over 100 Minutes Intravenous Every 24 hours 11/10/20 1622 11/15/20 1759   11/10/20 2200  piperacillin-tazobactam (ZOSYN) IVPB 3.375 g        3.375 g 12.5 mL/hr over 240 Minutes Intravenous Every 8 hours 11/10/20 1610     11/10/20 1800  anidulafungin (ERAXIS) 200 mg in sodium chloride 0.9 % 200 mL IVPB       "Followed by" Linked Group Details   200 mg 78 mL/hr over 200 Minutes Intravenous  Once 11/10/20 1622 11/11/20 0016   11/10/20 1700  anidulafungin (ERAXIS) 100 mg in sodium chloride 0.9 % 100 mL IVPB  Status:  Discontinued        100 mg 78 mL/hr over 100 Minutes Intravenous Every 24 hours 11/10/20 1610 11/10/20 1621   11/10/20 1700  piperacillin-tazobactam (ZOSYN) IVPB 3.375 g        3.375 g 100 mL/hr over 30 Minutes Intravenous  Once 11/10/20 1618 11/10/20 1720   11/08/20 0045  vancomycin (VANCOCIN) 50 mg/mL oral solution 125  mg  Status:  Discontinued        125 mg Oral Every 6 hours 11/07/20 2354 11/08/20 1304   10/31/20 2200  cefoTEtan (CEFOTAN) 2 g in sodium chloride 0.9 % 100 mL IVPB        2 g 200 mL/hr over 30 Minutes Intravenous Every 12 hours 10/31/20 1744 10/31/20 2205   10/31/20 0915  cefoTEtan (CEFOTAN) 2 g in sodium chloride 0.9 % 100 mL IVPB        2 g 200 mL/hr over 30 Minutes Intravenous On call to O.R. 10/31/20 9798 10/31/20 1312        Subjective: Seen and examined.  No complaints.  Feels better.  Has had several bowel movements.  Tolerating clear liquid diet.  No nausea. Objective: Vitals:   11/13/20 0226 11/13/20 0500 11/13/20 0642 11/13/20 0800  BP: (!) 144/73   (!) 158/66  Pulse:   93 (!) 105  Resp: 20  18   Temp:   98.1 F (36.7 C)   TempSrc:   Oral Oral   SpO2:    94%  Weight:  64.7 kg    Height:        Intake/Output Summary (Last 24 hours) at 11/13/2020 1106 Last data filed at 11/13/2020 1032 Gross per 24 hour  Intake 887.99 ml  Output 1000 ml  Net -112.01 ml   Filed Weights   11/10/20 1651 11/11/20 0400 11/13/20 0500  Weight: 69.8 kg 62.1 kg 64.7 kg    Examination:  General exam: Appears calm and comfortable  Respiratory system: Clear to auscultation. Respiratory effort normal. Cardiovascular system: S1 & S2 heard, RRR. No JVD, murmurs, rubs, gallops or clicks. No pedal edema. Gastrointestinal system: Abdomen is nondistended, soft and nontender. No organomegaly or masses felt. Normal bowel sounds heard. Central nervous system: Alert and oriented. No focal neurological deficits. Extremities: Symmetric 5 x 5 power. Skin: No rashes, lesions or ulcers.  Psychiatry: Judgement and insight appear normal. Mood & affect appropriate.    Data Reviewed: I have personally reviewed following labs and imaging studies  CBC: Recent Labs  Lab 11/08/20 0427 11/09/20 0652 11/10/20 0438 11/11/20 0529 11/12/20 0710 11/13/20 0300  WBC 14.0* 12.6* 23.0* 17.0* 13.1* 11.8*  NEUTROABS 11.7* 10.4* 21.7* 15.7*  --   --   HGB 11.1* 11.2* 12.0 11.4* 9.4* 9.2*  HCT 38.9 38.2 40.3 37.8 31.6* 30.2*  MCV 82.8 80.9 79.3* 79.4* 80.2 80.3  PLT 370 402* 455* 441* 424* 405*    Basic Metabolic Panel: Recent Labs  Lab 11/09/20 0447 11/10/20 0438 11/11/20 0529 11/12/20 0710 11/13/20 0300  NA 137 136 136 137 136  K 3.6 3.2* 3.7 3.4* 3.5  CL 106 100 101 104 106  CO2 25 27 27 27 25   GLUCOSE 115* 132* 125* 112* 135*  BUN 20 22 37* 28* 19  CREATININE 0.45 0.56 0.86 0.45 0.45  CALCIUM 8.9 9.3 9.1 8.6* 8.3*  MG 1.8 1.6* 2.1 2.1 2.0  PHOS 2.0* 2.8 3.4 1.2* 2.3*    GFR: Estimated Creatinine Clearance: 46 mL/min (by C-G formula based on SCr of 0.45 mg/dL).  Liver Function Tests: Recent Labs  Lab 11/07/20 0508 11/08/20 0427 11/09/20 0447  11/11/20 0529  AST 24 18 12* 13*  ALT 30 28 21 17   ALKPHOS 105 120 105 101  BILITOT 1.3* 1.7* 1.1 1.9*  PROT 5.2* 5.6* 5.2* 5.6*  ALBUMIN 2.5* 2.7* 2.6* 2.6*    CBG: Recent Labs  Lab 11/12/20 1117 11/12/20 1429 11/12/20 2151 11/13/20  8119 11/13/20 0804  GLUCAP 131* 132* 170* 152* 161*     Recent Results (from the past 240 hour(s))  C Difficile Quick Screen (NO PCR Reflex)     Status: None   Collection Time: 11/07/20  2:01 PM   Specimen: STOOL  Result Value Ref Range Status   C Diff antigen NEGATIVE NEGATIVE Final   C Diff toxin NEGATIVE NEGATIVE Final   C Diff interpretation No C. difficile detected.  Final    Comment: Performed at [facility], [place] [address] [place], [place] [address]  Culture, blood (routine x 2)     Status: None (Preliminary result)   Collection Time: 11/10/20  8:17 AM   Specimen: BLOOD  Result Value Ref Range Status   Specimen Description   Final    BLOOD BLOOD RIGHT FOREARM Performed at [facility] [address] [place], [place] [address]    Special Requests   Final    BOTTLES DRAWN AEROBIC AND ANAEROBIC Blood Culture adequate volume Performed at [facility] [address], [place], [place] [address]    Culture   Final    NO GROWTH 3 DAYS Performed at [facility], [place] [address] [place], [place] [address]    Report Status PENDING  Incomplete  Culture, blood (routine x 2)     Status: None (Preliminary result)   Collection Time: 11/10/20  8:21 AM   Specimen: BLOOD  Result Value Ref Range Status   Specimen Description   Final    BLOOD BLOOD LEFT FOREARM Performed at [facility] [address] [place], [place] [address]    Special Requests   Final    BOTTLES DRAWN AEROBIC AND ANAEROBIC Blood Culture adequate volume Performed at [facility] [address] [place], [place] [address]    Culture   Final    NO GROWTH 3  DAYS Performed at [facility], [place] [address] [place], [place] [address]    Report Status PENDING  Incomplete         Radiology Studies: DG Abd 1 View  Result Date: 11/11/2020 CLINICAL DATA:  Check gastric catheter placement EXAM: ABDOMEN - 1 VIEW COMPARISON:  11/10/2020 FINDINGS: Gastric catheter is been further advanced into the stomach and is coiled within the stomach. Right-sided PICC line is noted in satisfactory position. Cardiac shadow is stable. Stable airspace opacities in the bases similar to that noted on the prior exam. IMPRESSION: Gastric catheter coiled within the stomach. Electronically Signed   By: Inez Catalina M.D.   On: 11/11/2020 15:23   DG CHEST PORT 1 VIEW  Result Date: 11/12/2020 CLINICAL DATA:  86 year old female with increased work of breathing. EXAM: PORTABLE CHEST - 1 VIEW COMPARISON:  11/11/2020 FINDINGS: The mediastinal contours are within normal limits. No cardiomegaly. Slight interval increased conspicuity of hazy, streaky opacities in the right upper lobe. Similar appearing patchy, hazy opacities in the right lower lobe. Unchanged lingular opacities. No evidence of significant pleural effusion. No pneumothorax. Right upper extremity PICC terminates at the cavoatrial junction, unchanged. Enteric feeding tube courses off the inferior aspect of this image, however the tip is visualized in the gastric fundus. Similar-appearing atherosclerotic calcifications of the aortic arch. No acute osseous abnormality. IMPRESSION: Slight interval increased conspicuity of previously visualized right upper and lower lobe hazy pulmonary opacities. These findings could be seen with developing multifocal pneumonia, asymmetric pulmonary edema, or postobstructive atelectasis. Electronically Signed   By: Camillia Herter  Suttle MD   On: 11/12/2020 14:12        Scheduled Meds: . Chlorhexidine Gluconate Cloth  6 each Topical Daily  . enoxaparin (LOVENOX) injection  1 mg/kg Subcutaneous  BID  . feeding supplement  1 Container Oral TID BM  . insulin aspart  0-15 Units Subcutaneous Q8H  . lip balm  1 application Topical BID  . liver oil-zinc oxide   Topical BID  . metoprolol tartrate  5 mg Intravenous Q6H  . pantoprazole (PROTONIX) IV  40 mg Intravenous Q12H  . sodium chloride flush  10-40 mL Intracatheter Q12H   Continuous Infusions: . anidulafungin 100 mg (11/12/20 1709)  . diltiazem (CARDIZEM) infusion 7.5 mg/hr (11/12/20 1351)  . methocarbamol (ROBAXIN) IV    . piperacillin-tazobactam (ZOSYN)  IV 3.375 g (11/13/20 0530)  . potassium PHOSPHATE IVPB (in mmol) 30 mmol (11/13/20 1052)  . TPN ADULT (ION) 30 mL/hr at 11/12/20 1736  . TPN ADULT (ION)       LOS: 17 days    Time spent: 27 minutes  Darliss Cheney, MD [facility]   To contact the attending provider between 7A-7P or the covering provider during after hours 7P-7A, please log into the web site [email or web address] and access using universal [place] password for that web site. If you do not have the password, please call the hospital operator.  11/13/2020, 11:06 AM

## 2020-11-13 NOTE — Progress Notes (Signed)
Verified with pharmacist Stacey Drain RPH, that Zoysn and potassium phosphates are compatible via y-site. Per drew this is correct. Also verified with micromedex. Patient alert and oriented. Denying any needs. Will continue to monitor.

## 2020-11-14 DIAGNOSIS — C180 Malignant neoplasm of cecum: Secondary | ICD-10-CM | POA: Diagnosis not present

## 2020-11-14 LAB — COMPREHENSIVE METABOLIC PANEL
ALT: 12 U/L (ref 0–44)
AST: 14 U/L — ABNORMAL LOW (ref 15–41)
Albumin: 2.2 g/dL — ABNORMAL LOW (ref 3.5–5.0)
Alkaline Phosphatase: 98 U/L (ref 38–126)
Anion gap: 7 (ref 5–15)
BUN: 16 mg/dL (ref 8–23)
CO2: 26 mmol/L (ref 22–32)
Calcium: 8.6 mg/dL — ABNORMAL LOW (ref 8.9–10.3)
Chloride: 103 mmol/L (ref 98–111)
Creatinine, Ser: 0.54 mg/dL (ref 0.44–1.00)
GFR, Estimated: >60 mL/min (ref >60)
Glucose, Bld: 156 mg/dL — ABNORMAL HIGH (ref 70–99)
Potassium: 3.4 mmol/L — ABNORMAL LOW (ref 3.5–5.1)
Sodium: 136 mmol/L (ref 135–145)
Total Bilirubin: 0.7 mg/dL (ref 0.3–1.2)
Total Protein: 5.1 g/dL — ABNORMAL LOW (ref 6.5–8.1)

## 2020-11-14 LAB — GLUCOSE, CAPILLARY
Glucose-Capillary: 160 mg/dL — ABNORMAL HIGH (ref 70–99)
Glucose-Capillary: 160 mg/dL — ABNORMAL HIGH (ref 70–99)
Glucose-Capillary: 163 mg/dL — ABNORMAL HIGH (ref 70–99)
Glucose-Capillary: 170 mg/dL — ABNORMAL HIGH (ref 70–99)

## 2020-11-14 LAB — DIFFERENTIAL
Abs Immature Granulocytes: 0.39 10*3/uL — ABNORMAL HIGH (ref 0.00–0.07)
Basophils Absolute: 0.1 10*3/uL (ref 0.0–0.1)
Basophils Relative: 1 %
Eosinophils Absolute: 0.2 10*3/uL (ref 0.0–0.5)
Eosinophils Relative: 2 %
Immature Granulocytes: 4 %
Lymphocytes Relative: 11 %
Lymphs Abs: 1.2 10*3/uL (ref 0.7–4.0)
Monocytes Absolute: 0.6 10*3/uL (ref 0.1–1.0)
Monocytes Relative: 6 %
Neutro Abs: 8.4 10*3/uL — ABNORMAL HIGH (ref 1.7–7.7)
Neutrophils Relative %: 76 %

## 2020-11-14 LAB — CBC
HCT: 31 % — ABNORMAL LOW (ref 36.0–46.0)
Hemoglobin: 9.3 g/dL — ABNORMAL LOW (ref 12.0–15.0)
MCH: 24.2 pg — ABNORMAL LOW (ref 26.0–34.0)
MCHC: 30 g/dL (ref 30.0–36.0)
MCV: 80.5 fL (ref 80.0–100.0)
Platelets: 428 10*3/uL — ABNORMAL HIGH (ref 150–400)
RBC: 3.85 MIL/uL — ABNORMAL LOW (ref 3.87–5.11)
RDW: 25.3 % — ABNORMAL HIGH (ref 11.5–15.5)
WBC: 10.8 10*3/uL — ABNORMAL HIGH (ref 4.0–10.5)
nRBC: 0 % (ref 0.0–0.2)

## 2020-11-14 LAB — BRAIN NATRIURETIC PEPTIDE: B Natriuretic Peptide: 143.9 pg/mL — ABNORMAL HIGH (ref 0.0–100.0)

## 2020-11-14 LAB — TRIGLYCERIDES: Triglycerides: 62 mg/dL (ref <150)

## 2020-11-14 LAB — PHOSPHORUS: Phosphorus: 2.1 mg/dL — ABNORMAL LOW (ref 2.5–4.6)

## 2020-11-14 LAB — PREALBUMIN: Prealbumin: 6.1 mg/dL — ABNORMAL LOW (ref 18–38)

## 2020-11-14 LAB — MAGNESIUM: Magnesium: 1.9 mg/dL (ref 1.7–2.4)

## 2020-11-14 MED ORDER — POTASSIUM PHOSPHATES 15 MMOLE/5ML IV SOLN: 30.0000 mmol | Freq: Once | INTRAVENOUS | Status: DC

## 2020-11-14 MED ORDER — METOPROLOL SUCCINATE ER 50 MG PO TB24
50.0000 mg | ORAL_TABLET | Freq: Two times a day (BID) | ORAL | Status: DC
  Administered 2020-11-14 – 2020-11-17 (×7): 50 mg via ORAL
  Filled 2020-11-14 (×7): qty 1

## 2020-11-14 MED ORDER — INSULIN ASPART 100 UNIT/ML IJ SOLN
0.0000 [IU] | Freq: Four times a day (QID) | INTRAMUSCULAR | Status: DC
  Administered 2020-11-14 – 2020-11-15 (×4): 3 [IU] via SUBCUTANEOUS

## 2020-11-14 MED ORDER — POTASSIUM PHOSPHATES 15 MMOLE/5ML IV SOLN
30.0000 mmol | Freq: Once | INTRAVENOUS | Status: AC
  Administered 2020-11-14: 30 mmol via INTRAVENOUS
  Filled 2020-11-14: qty 10

## 2020-11-14 MED ORDER — APIXABAN 5 MG PO TABS
5.0000 mg | ORAL_TABLET | Freq: Two times a day (BID) | ORAL | Status: DC
  Administered 2020-11-14 – 2020-11-29 (×30): 5 mg via ORAL
  Filled 2020-11-14 (×33): qty 1

## 2020-11-14 MED ORDER — TRAVASOL 10 % IV SOLN
INTRAVENOUS | Status: AC
  Filled 2020-11-14: qty 900

## 2020-11-14 MED ORDER — FUROSEMIDE 10 MG/ML IJ SOLN
40.0000 mg | Freq: Once | INTRAMUSCULAR | Status: AC
  Administered 2020-11-14: 40 mg via INTRAVENOUS
  Filled 2020-11-14: qty 4

## 2020-11-14 MED ORDER — DILTIAZEM HCL ER COATED BEADS 180 MG PO CP24
180.0000 mg | ORAL_CAPSULE | Freq: Every day | ORAL | Status: DC
  Administered 2020-11-14 – 2020-11-29 (×16): 180 mg via ORAL
  Filled 2020-11-14 (×16): qty 1

## 2020-11-14 MED ORDER — POTASSIUM CHLORIDE 10 MEQ/50ML IV SOLN
10.0000 meq | INTRAVENOUS | Status: AC
  Filled 2020-11-14: qty 50

## 2020-11-14 NOTE — Progress Notes (Signed)
Physical Therapy Treatment Patient Details Name: Robin [name] MRN: [id number] DOB: September 08, 1934 Today's Date: 11/14/2020    History of Present Illness 86 year old F presented to ED for anemia and shortness of breath, and admitted for blood loss anemia due to GI bleed.  Hgb 8.5 (reportedly 11 in January 2022 per daughter).  Hemoccult positive.  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 s/p laparoscopic hemicolectomy 10/31/20. Pt  with PMH of A. fib on Eliquis and HTN. Hospitilization complicated by ileus. Required reinsertion of NG tube 5/26.    PT Comments    Pt requiring more assist for bed mobility and transfers today.  Pt also fatigues quickly with ambulating.  Pt not progressing as anticipated and may benefit from SNF upon d/c to improve strength, endurance and independence prior to d/c home.   Follow Up Recommendations  SNF     Equipment Recommendations  3in1 (PT)    Recommendations for Other Services       Precautions / Restrictions Precautions Precautions: Other (comment) Precaution Comments: abdominal surgery    Mobility  Bed Mobility Overal bed mobility: Needs Assistance Bed Mobility: Rolling;Sidelying to Sit;Sit to Supine Rolling: Supervision Sidelying to sit: Min guard;HOB elevated   Sit to supine: Min guard   General bed mobility comments: increased time and effort today    Transfers Overall transfer level: Needs assistance Equipment used: Rolling walker (2 wheeled) Transfers: Sit to/from [facility] Sit to Stand: Min assist         General transfer comment: assist for stand pivot to [facility] (requested before ambulating), requiring UE support for stability  Ambulation/Gait Ambulation/Gait assistance: Min guard Gait Distance (Feet): 120 Feet Assistive device: Rolling walker (2 wheeled) Gait Pattern/deviations: Step-through pattern;Decreased stride  length Gait velocity: decreased   General Gait Details: cues for posture, and distance from RW,  HR 116 bpm, Spo2 93% on room air upon return to room (reapplied 2L O2 [place] pt was wearing upon entering room per her request), RN aware   Stairs             Wheelchair Mobility    Modified Rankin (Stroke Patients Only)       Balance                                            Cognition Arousal/Alertness: Awake/alert Behavior During Therapy: WFL for tasks assessed/performed Overall Cognitive Status: Within Functional Limits for tasks assessed                                        Exercises      General Comments        Pertinent Vitals/Pain Pain Assessment: Faces Faces Pain Scale: Hurts a little bit Pain Location: abdomen Pain Descriptors / Indicators: Sore;Tightness Pain Intervention(s): Repositioned;Monitored during session    Home Living                      Prior Function            PT Goals (current goals can now be found in the care plan section) Progress towards PT goals: Progressing toward goals    Frequency    Min 3X/week      PT Plan Discharge plan  needs to be updated    Co-evaluation              AM-PAC PT "6 Clicks" Mobility   Outcome Measure  Help needed turning from your back to your side while in a flat bed without using bedrails?: A Little Help needed moving from lying on your back to sitting on the side of a flat bed without using bedrails?: A Little Help needed moving to and from a bed to a chair (including a wheelchair)?: A Little Help needed standing up from a chair using your arms (e.g., wheelchair or bedside chair)?: A Little Help needed to walk in hospital room?: A Little Help needed climbing 3-5 steps with a railing? : A Lot 6 Click Score: 17    End of Session Equipment Utilized During Treatment: Gait belt Activity Tolerance: Patient limited by fatigue Patient left: in  bed;with call bell/phone within reach;with bed alarm set Nurse Communication: Mobility status PT Visit Diagnosis: Difficulty in walking, not elsewhere classified (R26.2)     Time: 0737-1062 PT Time Calculation (min) (ACUTE ONLY): 23 min  Charges:  $Gait Training: 8-22 mins                     Arlyce Dice, DPT Acute Rehabilitation Services Pager: [phone] Office: [phone]  Bransyn Adami,KATHrine E 11/14/2020, 4:00 PM

## 2020-11-14 NOTE — Progress Notes (Signed)
14 Days Post-Op   Subjective/Chief Complaint: Patient moving bowels but feels very weak.  No nausea or vomiting   Objective: Vital signs in last 24 hours: Temp:  [98 F (36.7 C)-99.1 F (37.3 C)] 99.1 F (37.3 C) (05/30 0541) Pulse Rate:  [87-99] 87 (05/29 1802) Resp:  [18-24] 24 (05/30 0541) BP: (134-152)/(64-78) 147/65 (05/30 0541) SpO2:  [94 %-98 %] 95 % (05/29 1802) Weight:  [64.3 kg] 64.3 kg (05/30 0500) Last BM Date: 11/13/20  Intake/Output from previous day: 05/29 0701 - 05/30 0700 In: 1685.8 [P.O.:600; I.V.:655.4; IV Piggyback:430.5] Out: 950 [Urine:950] Intake/Output this shift: Total I/O In: -  Out: 500 [Urine:500]  Incision/Wound:CDI soft distention  Lab Results:  Recent Labs    11/13/20 0300 11/14/20 0355  WBC 11.8* 10.8*  HGB 9.2* 9.3*  HCT 30.2* 31.0*  PLT 405* 428*   BMET Recent Labs    11/13/20 0300 11/14/20 0355  NA 136 136  K 3.5 3.4*  CL 106 103  CO2 25 26  GLUCOSE 135* 156*  BUN 19 16  CREATININE 0.45 0.54  CALCIUM 8.3* 8.6*   PT/INR No results for input(s): LABPROT, INR in the last 72 hours. ABG No results for input(s): PHART, HCO3 in the last 72 hours.  Invalid input(s): PCO2, PO2  Studies/Results: DG CHEST PORT 1 VIEW  Result Date: 11/13/2020 CLINICAL DATA:  Increased respiratory rate EXAM: PORTABLE CHEST 1 VIEW COMPARISON:  Nov 04, 2020 FINDINGS: The cardiomediastinal silhouette is unchanged in contour.Removal of enteric tube. RIGHT upper extremity PICC tip terminates over the superior cavoatrial junction. Atherosclerotic calcifications. Biapical scarring. No pleural effusion. No pneumothorax. Bilateral hazy opacities, similar in comparison to prior. Visualized abdomen is unremarkable. Multilevel degenerative changes of the thoracic spine. Compression fracture of the inferior thoracic spine, unchanged. IMPRESSION: Similar appearance of hazy bilateral opacities. Differential considerations include infection, pulmonary edema or  aspiration Electronically Signed   By: Valentino Saxon MD   On: 11/13/2020 16:16   DG CHEST PORT 1 VIEW  Result Date: 11/12/2020 CLINICAL DATA:  86 year old female with increased work of breathing. EXAM: PORTABLE CHEST - 1 VIEW COMPARISON:  11/11/2020 FINDINGS: The mediastinal contours are within normal limits. No cardiomegaly. Slight interval increased conspicuity of hazy, streaky opacities in the right upper lobe. Similar appearing patchy, hazy opacities in the right lower lobe. Unchanged lingular opacities. No evidence of significant pleural effusion. No pneumothorax. Right upper extremity PICC terminates at the cavoatrial junction, unchanged. Enteric feeding tube courses off the inferior aspect of this image, however the tip is visualized in the gastric fundus. Similar-appearing atherosclerotic calcifications of the aortic arch. No acute osseous abnormality. IMPRESSION: Slight interval increased conspicuity of previously visualized right upper and lower lobe hazy pulmonary opacities. These findings could be seen with developing multifocal pneumonia, asymmetric pulmonary edema, or postobstructive atelectasis. Electronically Signed   By: Ruthann Cancer MD   On: 11/12/2020 14:12    Anti-infectives: Anti-infectives (From admission, onward)   Start     Dose/Rate Route Frequency Ordered Stop   11/11/20 1800  anidulafungin (ERAXIS) 100 mg in sodium chloride 0.9 % 100 mL IVPB       "Followed by" Linked Group Details   100 mg 78 mL/hr over 100 Minutes Intravenous Every 24 hours 11/10/20 1622 11/15/20 1759   11/10/20 2200  piperacillin-tazobactam (ZOSYN) IVPB 3.375 g        3.375 g 12.5 mL/hr over 240 Minutes Intravenous Every 8 hours 11/10/20 1610     11/10/20 1800  anidulafungin (ERAXIS) 200  mg in sodium chloride 0.9 % 200 mL IVPB       "Followed by" Linked Group Details   200 mg 78 mL/hr over 200 Minutes Intravenous  Once 11/10/20 1622 11/11/20 0016   11/10/20 1700  anidulafungin (ERAXIS) 100 mg  in sodium chloride 0.9 % 100 mL IVPB  Status:  Discontinued        100 mg 78 mL/hr over 100 Minutes Intravenous Every 24 hours 11/10/20 1610 11/10/20 1621   11/10/20 1700  piperacillin-tazobactam (ZOSYN) IVPB 3.375 g        3.375 g 100 mL/hr over 30 Minutes Intravenous  Once 11/10/20 1618 11/10/20 1720   11/08/20 0045  vancomycin (VANCOCIN) 50 mg/mL oral solution 125 mg  Status:  Discontinued        125 mg Oral Every 6 hours 11/07/20 2354 11/08/20 1304   10/31/20 2200  cefoTEtan (CEFOTAN) 2 g in sodium chloride 0.9 % 100 mL IVPB        2 g 200 mL/hr over 30 Minutes Intravenous Every 12 hours 10/31/20 1744 10/31/20 2205   10/31/20 0915  cefoTEtan (CEFOTAN) 2 g in sodium chloride 0.9 % 100 mL IVPB        2 g 200 mL/hr over 30 Minutes Intravenous On call to O.R. 10/31/20 0824 10/31/20 1312      Assessment/Plan: Atrial fibrillation- on toprolandcardizem, eliquis stopped 5/26 - ok for IV heparin if indicated per TRH HTN HLD Diarrhea - c diffnegative5/23 Severe malnutrition - prealbumin 5.7 (5/27), starting TPN --per TRH--  POD#14-s/p lap assisted right hemicolectomy for ascending colon mass (adenocarcinoma) and multiple polyps (likely pre-malignant), Dr. Johney Maine 10/31/20 - surgical path:Invasive moderately differentiated adenocarcinoma with mucinous  features, 4 cm; tumor invades the muscularis propria; margins of resection are not involved; twenty-one lymph nodes, negative for carcinoma (0/21). - CT 5/26 withmassive small bowel dilatation and gastric distention, possible distal SBO, concern for possible pneumatosis along the duodenum and may be some portal venous gas, no free air or perforation -  Acute blood loss anemia - Hgb down since Heparin restarted. stable today  FEN -IVF,, full liquids; continueTPN today until we are sure that she is taking adequate PO's VTE -lovenox ID -Cefotetanpre-op, PO vancomycin 5/24>>5/24, zosyn 5/26>>, eraxis 5/26>>   LOS: 18 days     Turner Daniels MD 11/14/2020

## 2020-11-14 NOTE — Progress Notes (Signed)
Occupational Therapy Treatment Patient Details Name: Robin [name] MRN: [id number] DOB: August 06, 1934 Today's Date: 11/14/2020    History of present illness 86 year old F presented to ED for anemia and shortness of breath, and admitted for blood loss anemia due to GI bleed.  Hgb 8.5 (reportedly 11 in January 2022 per daughter).  Hemoccult positive.  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 s/p laparoscopic hemicolectomy 10/31/20. Pt  with PMH of A. fib on Eliquis and HTN. Hospitilization complicated by ileus. Required reinsertion of NG tube 5/26.   OT comments  Patient presents with generalized weakness and poor activity tolerance. Patient's hospitalization complicated by ileus and has been prolonged. Patient now presents weaker and requiring more assistance for ADLs and min assist and steadying for standing and transfers. Due to patient's decline in functional abilities recommend short term rehab at discharge.     Follow Up Recommendations  SNF    Equipment Recommendations  None recommended by OT    Recommendations for Other Services      Precautions / Restrictions Precautions Precaution Comments: abdominal surgery Restrictions Weight Bearing Restrictions: No       Mobility Bed Mobility Overal bed mobility: Needs Assistance Bed Mobility: Supine to Sit     Supine to sit: Supervision;HOB elevated     General bed mobility comments: Increased time to transfer to side of bed.    Transfers Overall transfer level: Needs assistance Equipment used: Rolling walker (2 wheeled)   Sit to Stand: Min assist Stand pivot transfers: Min assist       General transfer comment: Min assist and RW to pivot to [facility] and then take steps to recliner. Patient unsteady with mild LOB transferring to [facility].    Balance Overall balance assessment: Needs assistance Sitting-balance support: No upper extremity  supported;Feet supported Sitting balance-Leahy Scale: Fair     Standing balance support: During functional activity Standing balance-Leahy Scale: Poor Standing balance comment: reliant on external assistance                           ADL either performed or assessed with clinical judgement   ADL Overall ADL's : Needs assistance/impaired Eating/Feeding: Set up;Sitting   Grooming: Set up;Sitting                   Toilet Transfer: Minimal assistance;Stand-pivot;BSC;RW Toilet Transfer Details (indicate cue type and reason): Only able to perform stand pivot to [facility] due to fatigue and urgency. Min assist for steadying for transfers with mild LOB transferring to [place] and Hygiene: Maximal assistance Toileting - Clothing Manipulation Details (indicate cue type and reason): Patient able to wash periarea - needs assistance for perianal area and clothing management.     Functional mobility during ADLs: Minimal assistance;Rolling walker       Vision Patient Visual Report: No change from baseline     Perception     Praxis      Cognition Arousal/Alertness: Awake/alert Behavior During Therapy: WFL for tasks assessed/performed Overall Cognitive Status: Within Functional Limits for tasks assessed                                          Exercises     Shoulder Instructions       General Comments      Pertinent Vitals/  Pain       Pain Assessment: Faces Faces Pain Scale: Hurts a little bit Pain Location: abdomen Pain Descriptors / Indicators: Sore;Tightness Pain Intervention(s): Limited activity within patient's tolerance;Monitored during session  Home Living                                          Prior Functioning/Environment              Frequency  Min 2X/week        Progress Toward Goals  OT Goals(current goals can now be found in the care plan section)  Progress towards OT  goals: Not progressing toward goals - comment (medical complications, prolonged hospitlization)  Acute Rehab OT Goals Patient Stated Goal: to get stronger OT Goal Formulation: With patient Time For Goal Achievement: 11/28/20 Potential to Achieve Goals: Good  Plan Discharge plan needs to be updated    Co-evaluation                 AM-PAC OT "6 Clicks" Daily Activity     Outcome Measure   Help from another person eating meals?: A Little Help from another person taking care of personal grooming?: A Little Help from another person toileting, which includes using toliet, bedpan, or urinal?: A Lot Help from another person bathing (including washing, rinsing, drying)?: A Lot Help from another person to put on and taking off regular upper body clothing?: A Little Help from another person to put on and taking off regular lower body clothing?: A Lot 6 Click Score: 15    End of Session Equipment Utilized During Treatment: Rolling walker  OT Visit Diagnosis: Pain;Other abnormalities of gait and mobility (R26.89);Unsteadiness on feet (R26.81)   Activity Tolerance Other (comment);Patient limited by fatigue   Patient Left in chair;with call bell/phone within reach;with family/visitor present   Nurse Communication  (okay to see per RN)        Time: 5449-2010 OT Time Calculation (min): 33 min  Charges: OT General Charges $OT Visit: 1 Visit OT Treatments $Self Care/Home Management : 8-22 mins $Therapeutic Activity: 8-22 mins  Derl Barrow, OTR/L [facility]  Office [phone] Pager: [place] 11/14/2020, 10:31 AM

## 2020-11-14 NOTE — Progress Notes (Signed)
PHARMACY - TOTAL PARENTERAL NUTRITION CONSULT NOTE   Indication: Small bowel obstruction; anticipate prolonged NPO status  Patient Measurements: Height: 5\' 4"  (162.6 cm) Weight: 64.3 kg (141 lb 12.1 oz) IBW/kg (Calculated) : 54.7 TPN AdjBW (KG): 65.4 Body mass index is 24.33 kg/m. Usual Weight: 66 kg (~10 lb weight loss this admission)  Assessment:  20 yoF with PMH Afib admitted 5/11 with anemia; found to have bleeding colonic mass suspicious for malignancy and underwent hemicolectomy 5/16. Developed ileus POD3 which initially improved, then worsened POD10. CT showed massive SBO and no immediate indication for surgery, so Pharmacy consulted to begin TPN for expected prolonged ileus.  Glucose / Insulin: no Hx DM; CBGs rising with TPN rate advancement - 9 units SSI required/24hr Electrolytes: Some evidence of refeeding yesterday; K decreased despite replacement, Phos improved but remain borderline low; CorrCa 10.04 WNL; all others stable WNL Renal: BUN/SCr, bicarb stable WNL; UOP 957ml Hepatic: Tbili improved to WNL; albumin low/stable; LFTs WNL Prealbumin: significantly low 5/27 (5.7), unchanged 5/20 (6.1) I/O: d/c NGT 5/29 - tolerating clears per Surgery note, although no intake charted; advancing to FLD 5/29 - no MIVF; diltiazem in D5 running at <10 ml/hr - eating well prior to admission, but likely some degree of malnutrition even then d/t malignancy; inconsistent diet since admission so will treat as moderate risk for refeeding GI Imaging: - 5/13 colonoscopy: multiple polyps; larger masses at cecum and transverse colon - 5/14 MRI liver: no metastatic dz - 5/18 AXR: dilated SB loops; possible ileus - 5/26 CTa/p: severely dilated SB loops (not including ileocolonic anastomosis) concerning for distal SBO, also intramural and intrahepatic portal venous gas suggesting possible ischemic bowel Surgeries / Procedures:  - 5/16 laparoscopic hemicolectomy/cecectomy; omentopexy of ileocolonic  anastomosis  Central access: PICC placed 5/27 TPN start date: 5/27  Nutritional Goals RD recs 5/27 Kcal:  1700-1900 kcal Protein:  80-95 grams Fluid:  >/= 2 L/day  TPN at goal rate of 75 mL/hr provides 90 g of protein and 1818 kcals per day  Current Nutrition:  FLD, TPN  Plan:  Now:  Potassium chloride 37meq IV x 1  Repeat KPhos 30 mmol IV x 1 (provides 45 mEq potassium)  At 1800:  Advance TPN to goal 67ml/hr  Electrolytes in TPN: (standard) - no changes  Na - 44mEq/L  K - 80mEq/L  Ca - 23mEq/L  Mg - 27mEq/L  Phos - 29mmol/L  Cl:Ac ratio 1:1  Add standard MVI and trace elements to TPN  Continue SSI moderate scale, increase q6h CBG checks   MIVF per primary  Monitor TPN labs on Mon/Thurs  Peggyann Juba, PharmD, BCPS Pharmacy: [phone] 11/14/2020, 7:21 AM

## 2020-11-14 NOTE — Progress Notes (Signed)
PROGRESS NOTE    Robin [name]  OJJ:[id number] DOB: 27-Feb-1935 DOA: 10/26/2020 PCP: [name], PA-C   No chief complaint on file.  Brief Narrative:  86 year old F with PMH of A. fib on Eliquis and HTN presented to ED for anemia and shortness of breath, and admitted for blood loss anemia due to GI bleed.  Hgb 8.5 (reportedly 11 in January 2022 per daughter).  Hemoccult positive.  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  Colonoscopy on 5/13 confirmed malignant tumor adjacent to the IC valve. Pathology from proximal mass with invasive adenocarcinoma.  Pathology of polyp from transverse colon and splenic flexure with at least high-grade dysplasia. MRI abdomen with and without contrast without significant finding within the visualized portion of the abdomen. Patient underwent laparoscopic proximal hemicolectomy with reanastomosis on 5/16.  Post op treatment now per surgery, complicated by ileus, which was initially slowly improving but then patient had fever of 1-1.4 early morning of 11/10/2020 with worsening abdominal pain and leukocytosis so repeat CT abdomen pelvis was done which showed with massive small bowel dilatation and gastric distention, possible distal SBO, concern for possible pneumatosis along the duodenum and may be some portal venous gas, no free air or perforation. NG tube replaced 5/26 and patient started on IV zosyn and IV eraxis.  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 MRI as above.  Pathology from proximal mass with invasive adenocarcinoma.  Pathology of polyp from transverse colon and splenic flexure with at least high-grade dysplasia.  -S/p laparoscopic proximal hemicolectomy with reanastomosis by Dr. Johney Maine on 5/16 -General surgery managing -surgical pathology -> invasive moderately differentiated adenocarcinoma with mucinous features, tumor invades muscularis propia, margins of resction not involved, 21 LN's negative for carcinoma.  - Dr. Johney Maine planning for patient to follow up with tumor board - Will need outpatient oncology follow up  Nausea  Vomiting  Ileus  - plain film with dilated small bowel loobs concerning for distal small bowel obstruction or ileus - NGT d/c'd 5/21, diet was advanced but then de-escalated to n.p.o. due to following changes.  Patient had fever of 101.4 early this morning with tachycardia and now rising leukocytosis as well as worsening abdominal pain.  Abdominal x-ray showed distal small bowel obstruction. CT 5/26 with massive small bowel dilatation and gastric distention, possible distal SBO, concern for possible pneumatosis along the duodenum and may be some portal venous gas, no free air or perforation. NG tube replaced 5/26 and patient started on IV zosyn and IV eraxis.  She feels better.  No abdominal pain.  Has had 2 bowel movements in last 24 hours.  Passing flatus.  Just feels weak.  Tolerating liquid diet. Surgery managing. Continues to be on TPN.  Diarrhea - negative c diff studies.  Imodium held.  Severe iron deficiency anemia due to acute on chronic blood loss from GI bleed: FOBT positive.  Hgb 13.7 in 2019, and reportedly about 12 in January 2022 per daughter. - Hb relatively stable today, follow - labs  c/w iron def anemia, low normal B12 (follow MMA - pending)  Paroxysmal A. Fib with RVR: On Toprol-XL, Cardizem and Eliquis at home.  CHA2DS2-VASc score 4 (age, sex, HTN) That she is able  to tolerate p.o., we will switch her back to p.o. diltiazem, metoprolol and anticoagulation.  Essential hypertension: Normotensive. Continue IV Lopressor.  Hypokalemia/hypophosphatemia/Hypomagnesemia: Magnesium normal but low phosphate and potassium, will replace both of them.  Hyperglycemia -Hemoglobin A1c within normal range.  Pulmonary edema/acute on chronic diastolic congestive heart failure: Patient has normal ejection fraction based on the echo done 2 years ago.  Yesterday, patient had some shortness of breath and was hypoxic.  Chest x-ray showed pulmonary edema.  She was given 1 dose of Lasix yesterday.  She feels much better today.  Still has some crackles on the bases today.  We will give her another dose of Lasix IV 40 mg.  Will check echo.  DVT prophylaxis: Eliquis Code Status: full Family Communication: Daughter at bedside. Disposition:   Status is: Inpatient  Remains inpatient appropriate because:Inpatient level of care appropriate due to severity of illness   Dispo: The patient is from: Home              Anticipated d/c is to: SNF              Patient currently is not medically stable to d/c.   Difficult to place patient No       Consultants:   Surgery  GI  Procedures:  5/16 PROCEDURE:   LAPAROSCOPIC PROXIMAL HEMICOLECTOMY  OMENTOPEXY OF ANASTOMOSIS LAPAROSCOPIC LYSIS OF ADHESIONS TRANSVERSUS ABDOMINIS PLANE (TAP) BLOCK - BILATERAL  Colonoscopy - Obvious malignant tumor adjacent to the IC valve. Biopsied. - Several polyps were also removed (mixed snare cautery and cold snare). The largest was located at the presumed splenic flexure, removed piecemeal, and although it was soft and not overtly malignant I labled the site with submucosal Spot injections given it's size (2.5cm). - Return patient to hospital ward for ongoing care. - Will need staging tests with CT scan chest, abd, pelvis. CEA. - I recommend continuing to hold eliquis for now. Await  pathology results. - General surgery has already been consulted. - [facility] will return to see her on Monday. Drs. Collene Mares and Benson Norway are covering this weekend if needed.  Antimicrobials: Anti-infectives (From admission, onward)   Start     Dose/Rate Route Frequency Ordered Stop   11/11/20 1800  anidulafungin (ERAXIS) 100 mg in sodium chloride 0.9 % 100 mL IVPB       "Followed by" Linked Group Details   100 mg 78 mL/hr over 100 Minutes Intravenous Every 24 hours 11/10/20 1622 11/15/20 1759   11/10/20 2200  piperacillin-tazobactam (ZOSYN) IVPB 3.375 g        3.375 g 12.5 mL/hr over 240 Minutes Intravenous Every 8 hours 11/10/20 1610     11/10/20 1800  anidulafungin (ERAXIS) 200 mg in sodium chloride 0.9 % 200 mL IVPB       "Followed by" Linked Group Details   200 mg 78 mL/hr over 200 Minutes Intravenous  Once 11/10/20 1622 11/11/20 0016   11/10/20 1700  anidulafungin (ERAXIS) 100 mg in sodium chloride 0.9 % 100 mL IVPB  Status:  Discontinued        100 mg 78 mL/hr over 100 Minutes Intravenous Every 24 hours 11/10/20 1610 11/10/20 1621   11/10/20 1700  piperacillin-tazobactam (ZOSYN) IVPB 3.375 g        3.375 g 100 mL/hr  over 30 Minutes Intravenous  Once 11/10/20 1618 11/10/20 1720   11/08/20 0045  vancomycin (VANCOCIN) 50 mg/mL oral solution 125 mg  Status:  Discontinued        125 mg Oral Every 6 hours 11/07/20 2354 11/08/20 1304   10/31/20 2200  cefoTEtan (CEFOTAN) 2 g in sodium chloride 0.9 % 100 mL IVPB        2 g 200 mL/hr over 30 Minutes Intravenous Every 12 hours 10/31/20 1744 10/31/20 2205   10/31/20 0915  cefoTEtan (CEFOTAN) 2 g in sodium chloride 0.9 % 100 mL IVPB        2 g 200 mL/hr over 30 Minutes Intravenous On call to O.R. 10/31/20 6812 10/31/20 1312        Subjective: Seen and examined.  Daughter at the bedside.  Patient feels much better than yesterday.  No abdominal pain.  Has had 2 bowel movements in the last 24 hours.  Denies shortness of breath.  Just feels  generalized weakness.  Objective: Vitals:   11/13/20 2225 11/14/20 0201 11/14/20 0500 11/14/20 0541  BP: (!) 141/76 (!) 144/75  (!) 147/65  Pulse:      Resp: (!) 24 (!) 22  (!) 24  Temp: 98.2 F (36.8 C) 99.1 F (37.3 C)  99.1 F (37.3 C)  TempSrc: Oral Oral  Oral  SpO2:      Weight:   64.3 kg   Height:        Intake/Output Summary (Last 24 hours) at 11/14/2020 1151 Last data filed at 11/14/2020 1118 Gross per 24 hour  Intake 1335.84 ml  Output 1250 ml  Net 85.84 ml   Filed Weights   11/11/20 0400 11/13/20 0500 11/14/20 0500  Weight: 62.1 kg 64.7 kg 64.3 kg    Examination:  General exam: Appears calm and comfortable  Respiratory system: Fine crackles at the bases bilaterally. Respiratory effort normal. Cardiovascular system: S1 & S2 heard, RRR. No JVD, murmurs, rubs, gallops or clicks. No pedal edema. Gastrointestinal system: Abdomen is slightly distended but soft and nontender. No organomegaly or masses felt. Normal bowel sounds heard. Central nervous system: Alert and oriented. No focal neurological deficits. Extremities: Symmetric 5 x 5 power. Skin: No rashes, lesions or ulcers.  Psychiatry: Judgement and insight appear normal. Mood & affect appropriate.    Data Reviewed: I have personally reviewed following labs and imaging studies  CBC: Recent Labs  Lab 11/08/20 0427 11/09/20 0652 11/10/20 0438 11/11/20 0529 11/12/20 0710 11/13/20 0300 11/14/20 0355  WBC 14.0* 12.6* 23.0* 17.0* 13.1* 11.8* 10.8*  NEUTROABS 11.7* 10.4* 21.7* 15.7*  --   --  8.4*  HGB 11.1* 11.2* 12.0 11.4* 9.4* 9.2* 9.3*  HCT 38.9 38.2 40.3 37.8 31.6* 30.2* 31.0*  MCV 82.8 80.9 79.3* 79.4* 80.2 80.3 80.5  PLT 370 402* 455* 441* 424* 405* 428*    Basic Metabolic Panel: Recent Labs  Lab 11/10/20 0438 11/11/20 0529 11/12/20 0710 11/13/20 0300 11/14/20 0355  NA 136 136 137 136 136  K 3.2* 3.7 3.4* 3.5 3.4*  CL 100 101 104 106 103  CO2 27 27 27 25 26   GLUCOSE 132* 125* 112* 135*  156*  BUN 22 37* 28* 19 16  CREATININE 0.56 0.86 0.45 0.45 0.54  CALCIUM 9.3 9.1 8.6* 8.3* 8.6*  MG 1.6* 2.1 2.1 2.0 1.9  PHOS 2.8 3.4 1.2* 2.3* 2.1*    GFR: Estimated Creatinine Clearance: 46 mL/min (by C-G formula based on SCr of 0.54 mg/dL).  Liver Function Tests: Recent  Labs  Lab 11/08/20 0427 11/09/20 0447 11/11/20 0529 11/14/20 0355  AST 18 12* 13* 14*  ALT 28 21 17 12   ALKPHOS 120 105 101 98  BILITOT 1.7* 1.1 1.9* 0.7  PROT 5.6* 5.2* 5.6* 5.1*  ALBUMIN 2.7* 2.6* 2.6* 2.2*    CBG: Recent Labs  Lab 11/13/20 0537 11/13/20 0804 11/13/20 1328 11/13/20 2223 11/14/20 0521  GLUCAP 152* 161* 174* 166* 160*     Recent Results (from the past 240 hour(s))  C Difficile Quick Screen (NO PCR Reflex)     Status: None   Collection Time: 11/07/20  2:01 PM   Specimen: STOOL  Result Value Ref Range Status   C Diff antigen NEGATIVE NEGATIVE Final   C Diff toxin NEGATIVE NEGATIVE Final   C Diff interpretation No C. difficile detected.  Final    Comment: Performed at [facility], [place] [address] [place], [place] [address]  Culture, blood (routine x 2)     Status: None (Preliminary result)   Collection Time: 11/10/20  8:17 AM   Specimen: BLOOD  Result Value Ref Range Status   Specimen Description   Final    BLOOD BLOOD RIGHT FOREARM Performed at [facility] [address] [place], [place] [address]    Special Requests   Final    BOTTLES DRAWN AEROBIC AND ANAEROBIC Blood Culture adequate volume Performed at [facility] [address], [place], [place] [address]    Culture   Final    NO GROWTH 4 DAYS Performed at [facility], [place] [address], [place], [place] [address]    Report Status PENDING  Incomplete  Culture, blood (routine x 2)     Status: None (Preliminary result)   Collection Time: 11/10/20  8:21 AM   Specimen: BLOOD  Result Value Ref Range Status   Specimen Description   Final    BLOOD  BLOOD LEFT FOREARM Performed at [facility] [address] [place], [place] [address]    Special Requests   Final    BOTTLES DRAWN AEROBIC AND ANAEROBIC Blood Culture adequate volume Performed at [facility] [address] [place], [place] [address]    Culture   Final    NO GROWTH 4 DAYS Performed at [facility], [place] [address] [place], [place] [address]    Report Status PENDING  Incomplete         Radiology Studies: DG CHEST PORT 1 VIEW  Result Date: 11/13/2020 CLINICAL DATA:  Increased respiratory rate EXAM: PORTABLE CHEST 1 VIEW COMPARISON:  Nov 04, 2020 FINDINGS: The cardiomediastinal silhouette is unchanged in contour.Removal of enteric tube. RIGHT upper extremity PICC tip terminates over the superior cavoatrial junction. Atherosclerotic calcifications. Biapical scarring. No pleural effusion. No pneumothorax. Bilateral hazy opacities, similar in comparison to prior. Visualized abdomen is unremarkable. Multilevel degenerative changes of the thoracic spine. Compression fracture of the inferior thoracic spine, unchanged. IMPRESSION: Similar appearance of hazy bilateral opacities. Differential considerations include infection, pulmonary edema or aspiration Electronically Signed   By: Valentino Saxon MD   On: 11/13/2020 16:16   DG CHEST PORT 1 VIEW  Result Date: 11/12/2020 CLINICAL DATA:  86 year old female with increased work of breathing. EXAM: PORTABLE CHEST - 1 VIEW COMPARISON:  11/11/2020 FINDINGS: The mediastinal contours are within normal limits. No cardiomegaly. Slight interval increased conspicuity of hazy, streaky opacities in the right upper lobe. Similar appearing patchy, hazy opacities in the right lower lobe. Unchanged  lingular opacities. No evidence of significant pleural effusion. No pneumothorax. Right upper extremity PICC terminates at the cavoatrial junction, unchanged. Enteric feeding tube courses off the inferior  aspect of this image, however the tip is visualized in the gastric fundus. Similar-appearing atherosclerotic calcifications of the aortic arch. No acute osseous abnormality. IMPRESSION: Slight interval increased conspicuity of previously visualized right upper and lower lobe hazy pulmonary opacities. These findings could be seen with developing multifocal pneumonia, asymmetric pulmonary edema, or postobstructive atelectasis. Electronically Signed   By: Ruthann Cancer MD   On: 11/12/2020 14:12        Scheduled Meds: . apixaban  5 mg Oral BID  . Chlorhexidine Gluconate Cloth  6 each Topical Daily  . diltiazem  180 mg Oral Daily  . feeding supplement  1 Container Oral TID BM  . insulin aspart  0-15 Units Subcutaneous Q6H  . lip balm  1 application Topical BID  . liver oil-zinc oxide   Topical BID  . metoprolol succinate  50 mg Oral BID  . pantoprazole (PROTONIX) IV  40 mg Intravenous Q12H  . sodium chloride flush  10-40 mL Intracatheter Q12H   Continuous Infusions: . anidulafungin 100 mg (11/13/20 1726)  . methocarbamol (ROBAXIN) IV    . piperacillin-tazobactam (ZOSYN)  IV 3.375 g (11/14/20 0445)  . potassium PHOSPHATE IVPB (in mmol) 30 mmol (11/14/20 1150)  . TPN ADULT (ION) 60 mL/hr at 11/13/20 1748  . TPN ADULT (ION)       LOS: 18 days    Time spent: 29 minutes  Darliss Cheney, MD [facility]   To contact the attending provider between 7A-7P or the covering provider during after hours 7P-7A, please log into the web site [email or web address] and access using universal [place] password for that web site. If you do not have the password, please call the hospital operator.  11/14/2020, 11:51 AM

## 2020-11-15 ENCOUNTER — Inpatient Hospital Stay (HOSPITAL_COMMUNITY): Payer: Medicare PPO

## 2020-11-15 DIAGNOSIS — I5031 Acute diastolic (congestive) heart failure: Secondary | ICD-10-CM

## 2020-11-15 DIAGNOSIS — L8990 Pressure ulcer of unspecified site, unspecified stage: Secondary | ICD-10-CM | POA: Insufficient documentation

## 2020-11-15 DIAGNOSIS — C180 Malignant neoplasm of cecum: Secondary | ICD-10-CM | POA: Diagnosis not present

## 2020-11-15 LAB — ECHOCARDIOGRAM COMPLETE
Area-P 1/2: 3.53 cm2
Height: 64 in
S' Lateral: 2.19 cm
Weight: 2162.27 oz

## 2020-11-15 LAB — GLUCOSE, CAPILLARY
Glucose-Capillary: 160 mg/dL — ABNORMAL HIGH (ref 70–99)
Glucose-Capillary: 175 mg/dL — ABNORMAL HIGH (ref 70–99)
Glucose-Capillary: 142 mg/dL — ABNORMAL HIGH (ref 70–99)
Glucose-Capillary: 181 mg/dL — ABNORMAL HIGH (ref 70–99)

## 2020-11-15 LAB — BASIC METABOLIC PANEL
Anion gap: 4 — ABNORMAL LOW (ref 5–15)
BUN: 19 mg/dL (ref 8–23)
CO2: 28 mmol/L (ref 22–32)
Calcium: 8.5 mg/dL — ABNORMAL LOW (ref 8.9–10.3)
Chloride: 101 mmol/L (ref 98–111)
Creatinine, Ser: 0.46 mg/dL (ref 0.44–1.00)
GFR, Estimated: >60 mL/min (ref >60)
Glucose, Bld: 148 mg/dL — ABNORMAL HIGH (ref 70–99)
Potassium: 3.5 mmol/L (ref 3.5–5.1)
Sodium: 133 mmol/L — ABNORMAL LOW (ref 135–145)

## 2020-11-15 LAB — CULTURE, BLOOD (ROUTINE X 2)
Culture: NO GROWTH
Culture: NO GROWTH
Special Requests: ADEQUATE
Special Requests: ADEQUATE

## 2020-11-15 LAB — PHOSPHORUS: Phosphorus: 2.2 mg/dL — ABNORMAL LOW (ref 2.5–4.6)

## 2020-11-15 LAB — MAGNESIUM: Magnesium: 2 mg/dL (ref 1.7–2.4)

## 2020-11-15 MED ORDER — ARTIFICIAL TEARS OPHTHALMIC OINT
TOPICAL_OINTMENT | OPHTHALMIC | Status: DC | PRN
  Filled 2020-11-15: qty 3.5

## 2020-11-15 MED ORDER — CALCIUM POLYCARBOPHIL 625 MG PO TABS
625.0000 mg | ORAL_TABLET | Freq: Two times a day (BID) | ORAL | Status: DC
  Administered 2020-11-15 – 2020-11-18 (×6): 625 mg via ORAL
  Filled 2020-11-15 (×6): qty 1

## 2020-11-15 MED ORDER — K PHOS MONO-SOD PHOS DI & MONO 155-852-130 MG PO TABS
500.0000 mg | ORAL_TABLET | Freq: Two times a day (BID) | ORAL | Status: DC
  Administered 2020-11-15 – 2020-11-17 (×5): 500 mg via ORAL
  Filled 2020-11-15 (×5): qty 2

## 2020-11-15 MED ORDER — INSULIN ASPART 100 UNIT/ML IJ SOLN
0.0000 [IU] | Freq: Three times a day (TID) | INTRAMUSCULAR | Status: DC
  Administered 2020-11-15: 2 [IU] via SUBCUTANEOUS
  Administered 2020-11-15: 3 [IU] via SUBCUTANEOUS
  Administered 2020-11-16 (×2): 2 [IU] via SUBCUTANEOUS
  Administered 2020-11-16: 3 [IU] via SUBCUTANEOUS

## 2020-11-15 MED ORDER — INSULIN ASPART 100 UNIT/ML IJ SOLN: 0.0000 [IU] | Freq: Every day | INTRAMUSCULAR | Status: DC

## 2020-11-15 MED ORDER — ACETAMINOPHEN 325 MG PO TABS: 325.0000 mg | ORAL_TABLET | Freq: Four times a day (QID) | ORAL | Status: DC | PRN

## 2020-11-15 MED ORDER — FENTANYL CITRATE (PF) 100 MCG/2ML IJ SOLN: 25.0000 ug | INTRAMUSCULAR | Status: DC | PRN

## 2020-11-15 MED ORDER — SODIUM CHLORIDE 0.9 % IV SOLN
3.0000 g | Freq: Four times a day (QID) | INTRAVENOUS | Status: DC
  Administered 2020-11-15 – 2020-11-16 (×4): 3 g via INTRAVENOUS
  Filled 2020-11-15 (×4): qty 3

## 2020-11-15 MED ORDER — TRAMADOL HCL 50 MG PO TABS: 50.0000 mg | ORAL_TABLET | Freq: Four times a day (QID) | ORAL | Status: DC | PRN

## 2020-11-15 MED ORDER — ACETAMINOPHEN 325 MG PO TABS
650.0000 mg | ORAL_TABLET | Freq: Four times a day (QID) | ORAL | Status: DC | PRN
  Filled 2020-11-15: qty 2

## 2020-11-15 MED ORDER — FERROUS SULFATE 325 (65 FE) MG PO TABS
325.0000 mg | ORAL_TABLET | Freq: Every day | ORAL | Status: DC
  Administered 2020-11-15 – 2020-11-22 (×8): 325 mg via ORAL
  Filled 2020-11-15 (×8): qty 1

## 2020-11-15 MED ORDER — LOPERAMIDE HCL 2 MG PO CAPS: 2.0000 mg | ORAL_CAPSULE | Freq: Two times a day (BID) | ORAL | Status: DC

## 2020-11-15 MED ORDER — FUROSEMIDE 10 MG/ML IJ SOLN
40.0000 mg | Freq: Once | INTRAMUSCULAR | Status: AC
  Administered 2020-11-15: 40 mg via INTRAVENOUS
  Filled 2020-11-15: qty 4

## 2020-11-15 MED ORDER — PANTOPRAZOLE SODIUM 40 MG PO TBEC
40.0000 mg | DELAYED_RELEASE_TABLET | Freq: Every day | ORAL | Status: DC
  Administered 2020-11-15: 40 mg via ORAL
  Filled 2020-11-15 (×2): qty 1

## 2020-11-15 MED ORDER — POTASSIUM PHOSPHATES 15 MMOLE/5ML IV SOLN
45.0000 mmol | Freq: Once | INTRAVENOUS | Status: AC
  Administered 2020-11-15: 45 mmol via INTRAVENOUS
  Filled 2020-11-15: qty 15

## 2020-11-15 MED ORDER — POTASSIUM PHOSPHATES 15 MMOLE/5ML IV SOLN: 30.0000 mmol | Freq: Once | INTRAVENOUS | Status: DC

## 2020-11-15 MED ORDER — TRAVASOL 10 % IV SOLN
INTRAVENOUS | Status: AC
  Filled 2020-11-15: qty 900

## 2020-11-15 NOTE — Progress Notes (Signed)
PROGRESS NOTE    Robin [name]  TML:[id number] DOB: March 08, 1935 DOA: 10/26/2020 PCP: [name], PA-C   No chief complaint on file.  Brief Narrative:  86 year old F with PMH of A. fib on Eliquis and HTN presented to ED for anemia and shortness of breath, and admitted for blood loss anemia due to GI bleed.  Hgb 8.5 (reportedly 11 in January 2022 per daughter).  Hemoccult positive.  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  Colonoscopy on 5/13 confirmed malignant tumor adjacent to the IC valve. Pathology from proximal mass with invasive adenocarcinoma.  Pathology of polyp from transverse colon and splenic flexure with at least high-grade dysplasia. MRI abdomen with and without contrast without significant finding within the visualized portion of the abdomen. Patient underwent laparoscopic proximal hemicolectomy with reanastomosis on 5/16.  Post op treatment now per surgery, complicated by ileus, which was initially slowly improving but then patient had fever of 1-1.4 early morning of 11/10/2020 with worsening abdominal pain and leukocytosis so repeat CT abdomen pelvis was done which showed with massive small bowel dilatation and gastric distention, possible distal SBO, concern for possible pneumatosis along the duodenum and may be some portal venous gas, no free air or perforation. NG tube replaced 5/26 and patient started on IV zosyn and IV eraxis.  Patient has improved, tube has been removed.  Diet is being advanced.  Surgery managing.  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 MRI as above.  Pathology from proximal mass with invasive adenocarcinoma.  Pathology of polyp from transverse colon and splenic flexure with at least high-grade dysplasia.  -S/p laparoscopic proximal hemicolectomy with reanastomosis by Dr. Johney Maine on 5/16 -General surgery managing -surgical pathology -> invasive moderately differentiated adenocarcinoma with mucinous features, tumor invades muscularis propia, margins of resction not involved, 21 LN's negative for carcinoma.  - Dr. Johney Maine planning for patient to follow up with tumor board - Will need outpatient oncology follow up  Nausea  Vomiting  Ileus  - plain film with dilated small bowel loobs concerning for distal small bowel obstruction or ileus - NGT d/c'd 5/21, diet was advanced but then de-escalated to n.p.o. due to following changes.  Patient had fever of 101.4 early morning of 11/10/2020 with tachycardia and now rising leukocytosis as well as worsening abdominal pain.  Abdominal x-ray showed distal small bowel obstruction. CT 5/26 with massive small bowel dilatation and gastric distention, possible distal SBO, concern for possible pneumatosis along the duodenum and may be some portal venous gas, no free air or perforation. NG tube replaced 5/26 and patient started on IV zosyn and IV eraxis.  She continues to feel well, tolerating diet, no abdominal pain, having bowel movements.  Diet being advanced by general surgery, management per general surgery.  Diarrhea - negative c diff studies.  Imodium held.  Severe iron deficiency anemia due to acute on chronic blood loss from GI bleed: FOBT positive.  Hgb 13.7 in 2019, and reportedly about 12 in  January 2022 per daughter. - Hb relatively stable today, follow - labs c/w iron def anemia, low normal B12 (follow MMA - pending)  Paroxysmal A. Fib with  RVR: On Toprol-XL, Cardizem and Eliquis at home.  CHA2DS2-VASc score 4 (age, sex, HTN) Rates controlled.  Continue overall beta-blocker, diltiazem and Eliquis.  Essential hypertension: Normotensive.  Continue current regimen.  Hypokalemia/hypophosphatemia/Hypomagnesemia: Magnesium and potassium normal.  Hypophosphatemia.  We will replace that.  Recheck in the morning.  Hyperglycemia -Hemoglobin A1c within normal range.  Pulmonary edema/acute on chronic diastolic congestive heart failure: Patient has normal ejection fraction based on the echo done 2 years ago.  Day before yesterday, chest x-ray showed pulmonary edema.  She was given 1 dose of Lasix then and again yesterday.  She continues to feel well, has had good urine output.  Still has very faint crackles at the bases bilaterally, will give another dose of IV Lasix today.  Reassess tomorrow.  Possible aspiration pneumonia: Chest x-ray also indicates possible aspiration pneumonia, although she has remained afebrile but she has shortness of breath and continues to have leukocytosis so I will switch her from Zosyn to Unasyn for 5 more days.  DVT prophylaxis: Eliquis Code Status: full Family Communication: None at bedside, discussed with daughter yesterday at the bedside. Disposition:   Status is: Inpatient  Remains inpatient appropriate because:Inpatient level of care appropriate due to severity of illness   Dispo: The patient is from: Home              Anticipated d/c is to: SNF              Patient currently is not medically stable to d/c.   Difficult to place patient No       Consultants:   Surgery  GI  Procedures:  5/16 PROCEDURE:   LAPAROSCOPIC PROXIMAL HEMICOLECTOMY  OMENTOPEXY OF ANASTOMOSIS LAPAROSCOPIC LYSIS OF ADHESIONS TRANSVERSUS ABDOMINIS PLANE (TAP) BLOCK - BILATERAL  Colonoscopy - Obvious malignant tumor adjacent to the IC valve. Biopsied. - Several polyps were also removed (mixed snare cautery and  cold snare). The largest was located at the presumed splenic flexure, removed piecemeal, and although it was soft and not overtly malignant I labled the site with submucosal Spot injections given it's size (2.5cm). - Return patient to hospital ward for ongoing care. - Will need staging tests with CT scan chest, abd, pelvis. CEA. - I recommend continuing to hold eliquis for now. Await pathology results. - General surgery has already been consulted. -  GI will return to see her on Monday. Drs. Collene Mares and Benson Norway are covering this weekend if needed.  Antimicrobials: Anti-infectives (From admission, onward)   Start     Dose/Rate Route Frequency Ordered Stop   11/11/20 1800  anidulafungin (ERAXIS) 100 mg in sodium chloride 0.9 % 100 mL IVPB  Status:  Discontinued       "Followed by" Linked Group Details   100 mg 78 mL/hr over 100 Minutes Intravenous Every 24 hours 11/10/20 1622 11/15/20 0815   11/10/20 2200  piperacillin-tazobactam (ZOSYN) IVPB 3.375 g        3.375 g 12.5 mL/hr over 240 Minutes Intravenous Every 8 hours 11/10/20 1610 11/15/20 1959   11/10/20 1800  anidulafungin (ERAXIS) 200 mg in sodium chloride 0.9 % 200 mL IVPB       "Followed by" Linked Group Details   200 mg 78 mL/hr over 200 Minutes Intravenous  Once 11/10/20 1622 11/11/20 0016   11/10/20 1700  anidulafungin (ERAXIS) 100 mg  in sodium chloride 0.9 % 100 mL IVPB  Status:  Discontinued        100 mg 78 mL/hr over 100 Minutes Intravenous Every 24 hours 11/10/20 1610 11/10/20 1621   11/10/20 1700  piperacillin-tazobactam (ZOSYN) IVPB 3.375 g        3.375 g 100 mL/hr over 30 Minutes Intravenous  Once 11/10/20 1618 11/10/20 1720   11/08/20 0045  vancomycin (VANCOCIN) 50 mg/mL oral solution 125 mg  Status:  Discontinued        125 mg Oral Every 6 hours 11/07/20 2354 11/08/20 1304   10/31/20 2200  cefoTEtan (CEFOTAN) 2 g in sodium chloride 0.9 % 100 mL IVPB        2 g 200 mL/hr over 30 Minutes Intravenous Every 12 hours  10/31/20 1744 10/31/20 2205   10/31/20 0915  cefoTEtan (CEFOTAN) 2 g in sodium chloride 0.9 % 100 mL IVPB        2 g 200 mL/hr over 30 Minutes Intravenous On call to O.R. 10/31/20 2774 10/31/20 1312        Subjective: Seen and examined.  No family at bedside.  Patient continues to feel well.  Still has some exertional shortness of breath.  No shortness of breath at rest.  No abdominal pain.  Tolerating diet.  Has had couple more bowel movements in last 24 hours.  Objective: Vitals:   11/14/20 1954 11/14/20 2351 11/15/20 0500 11/15/20 0547  BP: 138/68 (!) 143/65  134/62  Pulse: 92 84  84  Resp: 20 19  18   Temp: (!) 97.4 F (36.3 C) 97.8 F (36.6 C)  97.8 F (36.6 C)  TempSrc:  Oral  Oral  SpO2: 98% 99%  96%  Weight:   61.3 kg   Height:        Intake/Output Summary (Last 24 hours) at 11/15/2020 1032 Last data filed at 11/15/2020 0955 Gross per 24 hour  Intake 1383.13 ml  Output 2300 ml  Net -916.87 ml   Filed Weights   11/13/20 0500 11/14/20 0500 11/15/20 0500  Weight: 64.7 kg 64.3 kg 61.3 kg    Examination:  General exam: Appears calm and comfortable  Respiratory system: Clear to auscultation. Respiratory effort normal. Cardiovascular system: S1 & S2 heard, irregularly irregular rate rhythm. No JVD, murmurs, rubs, gallops or clicks. No pedal edema. Gastrointestinal system: Abdomen is nondistended, soft and slightly tender at right lower quadrant. No organomegaly or masses felt. Normal bowel sounds heard. Central nervous system: Alert and oriented. No focal neurological deficits. Extremities: Symmetric 5 x 5 power. Skin: No rashes, lesions or ulcers.  Psychiatry: Judgement and insight appear normal. Mood & affect appropriate.    Data Reviewed: I have personally reviewed following labs and imaging studies  CBC: Recent Labs  Lab 11/09/20 0652 11/10/20 0438 11/11/20 0529 11/12/20 0710 11/13/20 0300 11/14/20 0355  WBC 12.6* 23.0* 17.0* 13.1* 11.8* 10.8*   NEUTROABS 10.4* 21.7* 15.7*  --   --  8.4*  HGB 11.2* 12.0 11.4* 9.4* 9.2* 9.3*  HCT 38.2 40.3 37.8 31.6* 30.2* 31.0*  MCV 80.9 79.3* 79.4* 80.2 80.3 80.5  PLT 402* 455* 441* 424* 405* 428*    Basic Metabolic Panel: Recent Labs  Lab 11/11/20 0529 11/12/20 0710 11/13/20 0300 11/14/20 0355 11/15/20 0417  NA 136 137 136 136 133*  K 3.7 3.4* 3.5 3.4* 3.5  CL 101 104 106 103 101  CO2 27 27 25 26 28   GLUCOSE 125* 112* 135* 156* 148*  BUN 37* 28* 19  16 19  CREATININE 0.86 0.45 0.45 0.54 0.46  CALCIUM 9.1 8.6* 8.3* 8.6* 8.5*  MG 2.1 2.1 2.0 1.9 2.0  PHOS 3.4 1.2* 2.3* 2.1* 2.2*    GFR: Estimated Creatinine Clearance: 46 mL/min (by C-G formula based on SCr of 0.46 mg/dL).  Liver Function Tests: Recent Labs  Lab 11/09/20 0447 11/11/20 0529 11/14/20 0355  AST 12* 13* 14*  ALT 21 17 12   ALKPHOS 105 101 98  BILITOT 1.1 1.9* 0.7  PROT 5.2* 5.6* 5.1*  ALBUMIN 2.6* 2.6* 2.2*    CBG: Recent Labs  Lab 11/14/20 0521 11/14/20 1214 11/14/20 1649 11/14/20 2348 11/15/20 0545  GLUCAP 160* 160* 170* 163* 160*     Recent Results (from the past 240 hour(s))  C Difficile Quick Screen (NO PCR Reflex)     Status: None   Collection Time: 11/07/20  2:01 PM   Specimen: STOOL  Result Value Ref Range Status   C Diff antigen NEGATIVE NEGATIVE Final   C Diff toxin NEGATIVE NEGATIVE Final   C Diff interpretation No C. difficile detected.  Final    Comment: Performed at [facility], [place] [address], [place], [place] [address]  Culture, blood (routine x 2)     Status: None   Collection Time: 11/10/20  8:17 AM   Specimen: BLOOD  Result Value Ref Range Status   Specimen Description   Final    BLOOD BLOOD RIGHT FOREARM Performed at [facility] [address] [place], [place] [address]    Special Requests   Final    BOTTLES DRAWN AEROBIC AND ANAEROBIC Blood Culture adequate volume Performed at [facility] [address] [place], [place] [address]    Culture   Final    NO GROWTH 5 DAYS Performed at [facility], [place] [address] [place], [place] [address]    Report Status 11/15/2020 FINAL  Final  Culture, blood (routine x 2)     Status: None   Collection Time: 11/10/20  8:21 AM   Specimen: BLOOD  Result Value Ref Range Status   Specimen Description   Final    BLOOD BLOOD LEFT FOREARM Performed at [facility] [address], [place], [place] [address]    Special Requests   Final    BOTTLES DRAWN AEROBIC AND ANAEROBIC Blood Culture adequate volume Performed at [facility] [address], [place], [place] [address]    Culture   Final    NO GROWTH 5 DAYS Performed at [facility], [place] [address], [place],  [address]    Report Status 11/15/2020 FINAL  Final         Radiology Studies: DG CHEST PORT 1 VIEW  Result Date: 11/13/2020 CLINICAL DATA:  Increased respiratory rate EXAM: PORTABLE CHEST 1 VIEW COMPARISON:  Nov 04, 2020 FINDINGS: The cardiomediastinal silhouette is unchanged in contour.Removal of enteric tube. RIGHT upper extremity PICC tip terminates over the superior cavoatrial junction. Atherosclerotic calcifications. Biapical scarring. No pleural effusion. No pneumothorax. Bilateral hazy opacities, similar in comparison to prior. Visualized abdomen is unremarkable. Multilevel degenerative changes of the thoracic spine. Compression fracture of the inferior thoracic spine, unchanged. IMPRESSION: Similar appearance of hazy bilateral opacities. Differential considerations include infection, pulmonary edema or aspiration Electronically Signed   By: Valentino Saxon MD   On: 11/13/2020 16:16        Scheduled Meds: . apixaban  5 mg Oral BID  . Chlorhexidine Gluconate Cloth  6 each Topical Daily  . diltiazem  180 mg Oral Daily  . feeding supplement  1 Container Oral TID BM  . ferrous sulfate  325 mg Oral Q breakfast  . furosemide   40 mg Intravenous Once  . insulin aspart  0-15 Units Subcutaneous TID WC  . insulin aspart  0-5 Units Subcutaneous QHS  . lip balm  1 application Topical BID  . liver oil-zinc oxide   Topical BID  . metoprolol succinate  50 mg Oral BID  . pantoprazole  40 mg Oral Q1200  . phosphorus  500 mg Oral BID  . polycarbophil  625 mg Oral BID  . sodium chloride flush  10-40 mL Intracatheter Q12H   Continuous Infusions: . methocarbamol (ROBAXIN) IV    . piperacillin-tazobactam (ZOSYN)  IV 3.375 g (11/15/20 0345)  . potassium PHOSPHATE IVPB (in mmol)    . TPN ADULT (ION) 75 mL/hr at 11/14/20 1756  . TPN ADULT (ION)       LOS: 19 days    Time spent: 30 minutes  Darliss Cheney, MD [facility]   To contact the attending provider between 7A-7P or the covering provider during after hours 7P-7A, please log into the web site [email or web address] and access using universal [place] password for that web site. If you do not have the password, please call the hospital operator.  11/15/2020, 10:32 AM

## 2020-11-15 NOTE — Plan of Care (Signed)
  Problem: Health Behavior/Discharge Planning: Goal: Ability to manage health-related needs will improve Outcome: Progressing   Problem: Clinical Measurements: Goal: Ability to maintain clinical measurements within normal limits will improve Outcome: Progressing   Problem: Activity: Goal: Risk for activity intolerance will decrease Outcome: Progressing   Problem: Nutrition: Goal: Adequate nutrition will be maintained Outcome: Progressing   

## 2020-11-15 NOTE — Progress Notes (Signed)
PHARMACY - TOTAL PARENTERAL NUTRITION CONSULT NOTE   Indication: Small bowel obstruction; anticipate prolonged NPO status  Patient Measurements: Height: 5\' 4"  (162.6 cm) Weight: 61.3 kg (135 lb 2.3 oz) IBW/kg (Calculated) : 54.7 TPN AdjBW (KG): 65.4 Body mass index is 23.2 kg/m. Usual Weight: 66 kg (~10 lb weight loss this admission)  Assessment:  33 yoF with PMH Afib admitted 5/11 with anemia; found to have bleeding colonic mass suspicious for malignancy and underwent hemicolectomy 5/16. Developed ileus POD3 which initially improved, then worsened POD10. CT showed massive SBO and no immediate indication for surgery, so Pharmacy consulted to begin TPN for expected prolonged ileus.  Glucose / Insulin: no Hx DM; CBGs elevated with TPN rate advancement - range 160-163 - 12 units SSI required/24hr Electrolytes: Some evidence of refeeding; K decreased despite replacement, Phos improved but remain borderline low; CorrCa 9.94 WNL; Na 133 Renal: BUN/SCr, Cl/bicarb stable WNL; UOP 935ml Hepatic: 5/30 Tbili improved to WNL; albumin low/stable; LFTs WNL Prealbumin: significantly low 5/27 (5.7), unchanged 5/20 (6.1) I/O: d/c NGT 5/29 - I/O -880, 165ml stool charted, 282ml PO charted - no MIVF - IV lasix given 5/29, 5/30 - 544ml UOP charted but likely inaccurate with no foley (urine occurences x 2) GI Imaging: - 5/13 colonoscopy: multiple polyps; larger masses at cecum and transverse colon - 5/14 MRI liver: no metastatic dz - 5/18 AXR: dilated SB loops; possible ileus - 5/26 CTa/p: severely dilated SB loops (not including ileocolonic anastomosis) concerning for distal SBO, also intramural and intrahepatic portal venous gas suggesting possible ischemic bowel Surgeries / Procedures:  - 5/16 laparoscopic hemicolectomy/cecectomy; omentopexy of ileocolonic anastomosis  Central access: PICC placed 5/27 TPN start date: 5/27  Nutritional Goals RD recs 5/27 Kcal:  1700-1900 kcal Protein:  80-95  grams Fluid:  >/= 2 L/day  TPN at goal rate of 75 mL/hr provides 90 g of protein and 1818 kcals per day  Current Nutrition:  FLD, refusing supplements TPN at goal  Plan:  Now:  KPhos 45 mmol IV x 1 (provides 66 mEq potassium)  At 1800:  Continue TPN at goal 73ml/hr  Electrolytes in TPN:  Na - 30mEq/L increase  K - 85mEq/L increase  Ca - 51mEq/L  Mg - 41mEq/L  Phos - 54mmol/L increase  Cl:Ac ratio 1:1  Add standard MVI and trace elements to TPN  Continue SSI moderate scale ACHS CBG checks   MIVF per primary  Monitor TPN labs on Mon/Thurs, BMET/Phos in AM  Peggyann Juba, PharmD, BCPS Pharmacy: [phone] 11/15/2020, 7:04 AM

## 2020-11-15 NOTE — Progress Notes (Signed)
*  PRELIMINARY RESULTS* Echocardiogram 2D Echocardiogram has been performed.  Leavy Cella 11/15/2020, 1:30 PM

## 2020-11-15 NOTE — Progress Notes (Addendum)
Robin [name] [id number] 11/06/1934  CARE TEAM:  PCP: [name], PA-C  Outpatient Care Team: Patient Care Team: [name], PA-C as PCP - General (Family Medicine) Skeet Latch, MD as PCP - Cardiology (Cardiology) Milus Banister, MD as Attending Physician (Gastroenterology) [name], MD as Consulting Physician (General Surgery)  Inpatient Treatment Team: Treatment Team: Attending Provider: Darliss Cheney, MD; Rounding Team: Nolon Nations, MD; Consulting Physician: [name], MD; Registered Nurse: Lovina Reach, RN; Rounding Team: Garner Gavel, MD; Technician: Marca Ancona, NT; Pharmacist: Emiliano Dyer, [facility]; Janeece Riggers: Carlton Adam; Registered Nurse: Margarette Canada, RN; Utilization Review: Rolland Porter, RN; Case Manager: Dessa Phi, RN   Problem List:   Principal Problem:   Cecal cancer & polyps s/p lap hemicolectomy 10/31/2020 Active Problems:   High grade dysplasia in colonic adenomas transverse colon & splenic flexure   Paroxysmal atrial fibrillation (HCC)   Chronic anticoagulation   Anemia   GI bleed   Colonic mass   Colon cancer (HCC)   Hypophosphatemia   Protein-calorie malnutrition, severe ([place])   Pressure injury of skin   15 Days Post-Op  10/31/2020  POST-OPERATIVE DIAGNOSIS: CECALCOLON CANCER TRANSVERSE COLON POLYPS WITH HIGH GRADE DYSPLASIA & POSSIBLE ADENOCARCINOMA IRON DEFICIENCYANEMIAFROM LOWER GI BLOOD LOSS  PROCEDURE: LAPAROSCOPICPROXIMAL HEMICOLECTOMY  OMENTOPEXY OF ANASTOMOSIS LAPAROSCOPIC LYSIS OF ADHESIONS TRANSVERSUS ABDOMINIS PLANE (TAP) BLOCK - BILATERAL  SURGEON: Adin Hector, MD  OR FINDINGS:  CASE DATA:  Type of patient?:LDOW CASE (Surgical Hospitalist [facility] Inpatient)  Status of Case?URGENT Add On  Infection Present At Time Of Surgery(PATOS)?NO  Patient hadmoderately significant intra-abdominal adhesions from her prior open cholecystectomy as well as pelvic  surgeries.  Obvious bulky mass in the cecum near the ileocecal valve. Tattooing actually at the mid transverse colon. She had a rather redundant stretched out colon. Specimen opened on the back table and clearly the tattooed region had some fibrosis and thickening through the colon wall suspicious for some residual cancer.  No obvious metastatic disease on visceral parietal peritoneum or liver.  It is anisoperistaltic anastomosis between terminal ileum and distal transverse colon resting in the left upper quadrant.    Assessment  ILEUS  [facility] Stay = 19 days)  Assessment/Plan: Atrial fibrillation- on toprolandcardizem, eliquis stopped 5/26 - ok for IV heparin if indicated per TRH HTN HLD Diarrhea - c diffnegative5/23 Severe malnutrition - prealbumin 5.7 (5/27), starting TPN --per [facility]H--  POD#15-s/p lap assisted right hemicolectomy for ascending colon mass (adenocarcinoma) and multiple polyps (likely pre-malignant), Dr. Johney Maine 10/31/20 - surgical path:Invasive moderately differentiated adenocarcinoma with mucinous  features, 4 cm; tumor invades the muscularis propria; margins of resection are not involved; twenty-one lymph nodes, negative for carcinoma (0/21). - CT 5/26 withmassive small bowel dilatation and gastric distention, possible distal SBO, concern for possible pneumatosis along the duodenum and may be some portal venous gas, no free air or perforation -  Acute blood loss anemia in setting of chronic GI losses from cancer.  Worse on full anticoagulation.- Hgb down since Heparin restarted.Now on Eliquis.  Recheck.  Start oral iron.  That can help constipate as well  FEN -IVF,try to advance to soft diet.  Continue supplemental shakes.  Calorie counts soon.  continueTPNtoday until we are sure that she is taking adequate PO's.  Convert PPI to orally.  Hypokalemia.  Treat and check magnesium.  May get K-Phos since has chronic hypophosphatemia VTE  -Eliquis ID -Cefotetanpre-op, PO vancomycin 5/24>>5/24, zosyn 5/26>>, eraxis 5/26>>.  Stop all antibiotics since  5 days from diagnosis of pneumatosis without fever or leukocytosis and bowel function returning.  If recurs, repeat CT scan to rule out abscess or other concerns and perhaps ID consult.  Try and wean off.  Consider transfer to floor off telemetry for more aggressive physical therapy.  Reorder physical and occupational therapy.     30 minutes spent in review, evaluation, examination, counseling, and coordination of care.   I have reviewed this patient's available data, including medical history, events of note, physical examination and test results as part of my evaluation.  A significant portion of that time was spent in counseling.  Care during the described time interval was provided by me.  11/15/2020    Subjective: (Chief complaint)  Having large loose bowel movements yesterday and feels more cleaned out and better.  Denies much abdominal pain.  Feels very tired.  No much of an appetite but no nausea or vomiting on full liquids.  Drinking supplemental shakes well.  Objective:  Vital signs:  Vitals:   11/14/20 2351 11/15/20 0500 11/15/20 0547 11/15/20 1337  BP: (!) 143/65  134/62 138/75  Pulse: 84  84 89  Resp: 19  18 18   Temp: 97.8 F (36.6 C)  97.8 F (36.6 C) 98.2 F (36.8 C)  TempSrc: Oral  Oral Oral  SpO2: 99%  96% 98%  Weight:  61.3 kg    Height:        Last BM Date: 11/15/20  Intake/Output   Yesterday:  05/30 0701 - 05/31 0700 In: 1263.1 [P.O.:240; I.V.:743.1; IV Piggyback:280] Out: 2150 [Urine:550; Stool:1600] This shift:  Total I/O In: 902.8 [P.O.:360; I.V.:462.8; Other:80] Out: 800 [Urine:550; Stool:250]  Bowel function:  Flatus: YES  BM:  YES  Drain: (No drain)   Physical Exam:  General: Pt awake/alert in no acute distress.  Tired and mildly pale but not toxic. Eyes: PERRL, normal EOM.  Sclera clear.  No icterus Neuro: CN  II-XII intact w/o focal sensory/motor deficits. Lymph: No head/neck/groin lymphadenopathy Psych:  No delerium/psychosis/paranoia.  Oriented x 4 HENT: Normocephalic, Mucus membranes moist.  No thrush.  Sore throat Neck: Supple, No tracheal deviation.  No obvious thyromegaly Chest: No pain to chest wall compression.  Good respiratory excursion.  No audible wheezing CV:  Pulses intact.  Regular rhythm.  No major extremity edema MS: Normal AROM mjr joints.  No obvious deformity  Abdomen: Soft.  Moderately distended.  Nontender.  No evidence of peritonitis.  No incarcerated hernias.  Ext:  No deformity.  No mjr edema.  No cyanosis Skin: No petechiae / purpurea.  No major sores.  Warm and dry    Results:   Cultures: Recent Results (from the past 720 hour(s))  Resp Panel by RT-PCR (Flu A&B, Covid) Nasopharyngeal Swab     Status: None   Collection Time: 10/26/20 10:00 PM   Specimen: Nasopharyngeal Swab; Nasopharyngeal(NP) swabs in vial transport medium  Result Value Ref Range Status   SARS Coronavirus 2 by RT PCR NEGATIVE NEGATIVE Final    Comment: (NOTE) SARS-CoV-2 target nucleic acids are NOT DETECTED.  The SARS-CoV-2 RNA is generally detectable in upper respiratory specimens during the acute phase of infection. The lowest concentration of SARS-CoV-2 viral copies this assay can detect is 138 copies/mL. A negative result does not preclude SARS-Cov-2 infection and should not be used as the sole basis for treatment or other patient management decisions. A negative result may occur with  improper specimen collection/handling, submission of specimen other than nasopharyngeal swab, presence of viral  mutation(s) within the areas targeted by this assay, and inadequate number of viral copies(<138 copies/mL). A negative result must be combined with clinical observations, patient history, and epidemiological information. The expected result is Negative.  Fact Sheet for Patients:   EntrepreneurPulse.com.au  Fact Sheet for Healthcare Providers:  IncredibleEmployment.be  This test is no t yet approved or cleared by the [place] FDA and  has been authorized for detection and/or diagnosis of SARS-CoV-2 by FDA under an Emergency Use Authorization (EUA). This EUA will remain  in effect (meaning this test can be used) for the duration of the COVID-19 declaration under Section 564(b)(1) of the Act, 21 U.S.C.section 360bbb-3(b)(1), unless the authorization is terminated  or revoked sooner.       Influenza A by PCR NEGATIVE NEGATIVE Final   Influenza B by PCR NEGATIVE NEGATIVE Final    Comment: (NOTE) The Xpert Xpress SARS-CoV-2/FLU/RSV plus assay is intended as an aid in the diagnosis of influenza from Nasopharyngeal swab specimens and should not be used as a sole basis for treatment. Nasal washings and aspirates are unacceptable for Xpert Xpress SARS-CoV-2/FLU/RSV testing.  Fact Sheet for Patients: EntrepreneurPulse.com.au  Fact Sheet for Healthcare Providers: IncredibleEmployment.be  This test is not yet approved or cleared by the [place] FDA and has been authorized for detection and/or diagnosis of SARS-CoV-2 by FDA under an Emergency Use Authorization (EUA). This EUA will remain in effect (meaning this test can be used) for the duration of the COVID-19 declaration under Section 564(b)(1) of the Act, 21 U.S.C. section 360bbb-3(b)(1), unless the authorization is terminated or revoked.  Performed at [facility], [place] [address] [place], [place] [address]   Surgical pcr screen     Status: None   Collection Time: 10/31/20 10:19 AM   Specimen: Nasal Mucosa; Nasal Swab  Result Value Ref Range Status   MRSA, PCR NEGATIVE NEGATIVE Final   Staphylococcus aureus NEGATIVE NEGATIVE Final    Comment: (NOTE) The Xpert SA Assay (FDA approved for NASAL specimens in  patients 16 years of age and older), is one component of a comprehensive surveillance program. It is not intended to diagnose infection nor to guide or monitor treatment. Performed at [facility], [place] [address], [place], [place] [address]   C Difficile Quick Screen (NO PCR Reflex)     Status: None   Collection Time: 11/07/20  2:01 PM   Specimen: STOOL  Result Value Ref Range Status   C Diff antigen NEGATIVE NEGATIVE Final   C Diff toxin NEGATIVE NEGATIVE Final   C Diff interpretation No C. difficile detected.  Final    Comment: Performed at [facility], [place] [address], [place], [place] [address]  Culture, blood (routine x 2)     Status: None   Collection Time: 11/10/20  8:17 AM   Specimen: BLOOD  Result Value Ref Range Status   Specimen Description   Final    BLOOD BLOOD RIGHT FOREARM Performed at [place] [address] [place], [place] [address]    Special Requests   Final    BOTTLES DRAWN AEROBIC AND ANAEROBIC Blood Culture adequate volume Performed at [facility] [address], [place], [place] [address]    Culture   Final    NO GROWTH 5 DAYS Performed at [facility], [place] [address], [place],  [address]    Report Status 11/15/2020 FINAL  Final  Culture, blood (routine x 2)  Status: None   Collection Time: 11/10/20  8:21 AM   Specimen: BLOOD  Result Value Ref Range Status   Specimen Description   Final    BLOOD BLOOD LEFT FOREARM Performed at [facility] [address] [place], [place] [address]    Special Requests   Final    BOTTLES DRAWN AEROBIC AND ANAEROBIC Blood Culture adequate volume Performed at [place] [address], [place], [place] [address]    Culture   Final    NO GROWTH 5 DAYS Performed at [facility], [place] [address] [place],  [address]    Report Status 11/15/2020 FINAL  Final     Labs: Results for orders placed or performed during the hospital encounter of 10/26/20 (from the past 48 hour(s))  Procalcitonin - Baseline     Status: None   Collection Time: 11/13/20  5:37 PM  Result Value Ref Range   Procalcitonin 0.42 ng/mL    Comment:        Interpretation: PCT (Procalcitonin) <= 0.5 ng/mL: Systemic infection (sepsis) is not likely. Local bacterial infection is possible. (NOTE)       Sepsis PCT Algorithm           Lower Respiratory Tract                                      Infection PCT Algorithm    ----------------------------     ----------------------------         PCT < 0.25 ng/mL                PCT < 0.10 ng/mL          Strongly encourage             Strongly discourage   discontinuation of antibiotics    initiation of antibiotics    ----------------------------     -----------------------------       PCT 0.25 - 0.50 ng/mL            PCT 0.10 - 0.25 ng/mL               OR       >80% decrease in PCT            Discourage initiation of                                            antibiotics      Encourage discontinuation           of antibiotics    ----------------------------     -----------------------------         PCT >= 0.50 ng/mL              PCT 0.26 - 0.50 ng/mL               AND        <80% decrease in PCT             Encourage initiation of                                             antibiotics  Encourage continuation           of antibiotics    ----------------------------     -----------------------------        PCT >= 0.50 ng/mL                  PCT > 0.50 ng/mL               AND         increase in PCT                  Strongly encourage                                      initiation of antibiotics    Strongly encourage escalation           of antibiotics                                     -----------------------------                                           PCT <= 0.25 ng/mL                                                  OR                                        > 80% decrease in PCT                                      Discontinue / Do not initiate                                             antibiotics  Performed at [facility] [address], [place], [place] [address]   Glucose, capillary     Status: Abnormal   Collection Time: 11/13/20 10:23 PM  Result Value Ref Range   Glucose-Capillary 166 (H) 70 - 99 mg/dL    Comment: Glucose reference range applies only to samples taken after fasting for at least 8 hours.  Comprehensive metabolic panel     Status: Abnormal   Collection Time: 11/14/20  3:55 AM  Result Value Ref Range   Sodium 136 135 - 145 mmol/L   Potassium 3.4 (L) 3.5 - 5.1 mmol/L   Chloride 103 98 - 111 mmol/L   CO2 26 22 - 32 mmol/L   Glucose, Bld 156 (H) 70 - 99 mg/dL    Comment: Glucose reference range applies only to samples taken after fasting for at least 8 hours.   BUN 16 8 - 23 mg/dL   Creatinine, Ser 0.54 0.44 - 1.00 mg/dL   Calcium 8.6 (L) 8.9 - 10.3 mg/dL   Total  Protein 5.1 (L) 6.5 - 8.1 g/dL   Albumin 2.2 (L) 3.5 - 5.0 g/dL   AST 14 (L) 15 - 41 U/L   ALT 12 0 - 44 U/L   Alkaline Phosphatase 98 38 - 126 U/L   Total Bilirubin 0.7 0.3 - 1.2 mg/dL   GFR, Estimated >60 >60 mL/min    Comment: (NOTE) Calculated using the CKD-EPI Creatinine Equation (2021)    Anion gap 7 5 - 15    Comment: Performed at [facility], [place] [address], [place], [place] [address]  Magnesium     Status: None   Collection Time: 11/14/20  3:55 AM  Result Value Ref Range   Magnesium 1.9 1.7 - 2.4 mg/dL    Comment: Performed at [facility], [place] [address] [place], [place] [address]  Phosphorus     Status: Abnormal   Collection Time: 11/14/20  3:55 AM  Result Value Ref Range   Phosphorus 2.1 (L) 2.5 - 4.6 mg/dL    Comment: Performed at [facility], [place] [address], [place], [place] [address]  CBC     Status: Abnormal    Collection Time: 11/14/20  3:55 AM  Result Value Ref Range   WBC 10.8 (H) 4.0 - 10.5 K/uL   RBC 3.85 (L) 3.87 - 5.11 MIL/uL   Hemoglobin 9.3 (L) 12.0 - 15.0 g/dL   HCT 31.0 (L) 36.0 - 46.0 %   MCV 80.5 80.0 - 100.0 fL   MCH 24.2 (L) 26.0 - 34.0 pg   MCHC 30.0 30.0 - 36.0 g/dL   RDW 25.3 (H) 11.5 - 15.5 %   Platelets 428 (H) 150 - 400 K/uL   nRBC 0.0 0.0 - 0.2 %    Comment: Performed at [facility], [place] [address], [place], [place] [address]  Differential     Status: Abnormal   Collection Time: 11/14/20  3:55 AM  Result Value Ref Range   Neutrophils Relative % 76 %   Neutro Abs 8.4 (H) 1.7 - 7.7 K/uL   Lymphocytes Relative 11 %   Lymphs Abs 1.2 0.7 - 4.0 K/uL   Monocytes Relative 6 %   Monocytes Absolute 0.6 0.1 - 1.0 K/uL   Eosinophils Relative 2 %   Eosinophils Absolute 0.2 0.0 - 0.5 K/uL   Basophils Relative 1 %   Basophils Absolute 0.1 0.0 - 0.1 K/uL   WBC Morphology MILD LEFT SHIFT (1-5% METAS, OCC MYELO, OCC BANDS)     Comment: TOXIC GRANULATION   Immature Granulocytes 4 %   Abs Immature Granulocytes 0.39 (H) 0.00 - 0.07 K/uL    Comment: Performed at [facility], [place] [address], [place], [place] [address]  Prealbumin     Status: Abnormal   Collection Time: 11/14/20  3:55 AM  Result Value Ref Range   Prealbumin 6.1 (L) 18 - 38 mg/dL    Comment: Performed at [facility], [place] [address] [place], [place] [address]  Triglycerides     Status: None   Collection Time: 11/14/20  3:55 AM  Result Value Ref Range   Triglycerides 62 <150 mg/dL    Comment: Performed at [facility], [place] [address], [place], [place] [address]  Glucose, capillary     Status: Abnormal   Collection Time: 11/14/20  5:21 AM  Result Value Ref Range   Glucose-Capillary 160 (H) 70 - 99 mg/dL    Comment: Glucose reference range applies only to samples  taken after fasting for at least 8 hours.  Brain natriuretic peptide      Status: Abnormal   Collection Time: 11/14/20  8:55 AM  Result Value Ref Range   B Natriuretic Peptide 143.9 (H) 0.0 - 100.0 pg/mL    Comment: Performed at [facility], [place] [address] [place], [place] [address]  Glucose, capillary     Status: Abnormal   Collection Time: 11/14/20 12:14 PM  Result Value Ref Range   Glucose-Capillary 160 (H) 70 - 99 mg/dL    Comment: Glucose reference range applies only to samples taken after fasting for at least 8 hours.  Glucose, capillary     Status: Abnormal   Collection Time: 11/14/20  4:49 PM  Result Value Ref Range   Glucose-Capillary 170 (H) 70 - 99 mg/dL    Comment: Glucose reference range applies only to samples taken after fasting for at least 8 hours.  Glucose, capillary     Status: Abnormal   Collection Time: 11/14/20 11:48 PM  Result Value Ref Range   Glucose-Capillary 163 (H) 70 - 99 mg/dL    Comment: Glucose reference range applies only to samples taken after fasting for at least 8 hours.  Basic metabolic panel     Status: Abnormal   Collection Time: 11/15/20  4:17 AM  Result Value Ref Range   Sodium 133 (L) 135 - 145 mmol/L   Potassium 3.5 3.5 - 5.1 mmol/L   Chloride 101 98 - 111 mmol/L   CO2 28 22 - 32 mmol/L   Glucose, Bld 148 (H) 70 - 99 mg/dL    Comment: Glucose reference range applies only to samples taken after fasting for at least 8 hours.   BUN 19 8 - 23 mg/dL   Creatinine, Ser 0.46 0.44 - 1.00 mg/dL   Calcium 8.5 (L) 8.9 - 10.3 mg/dL   GFR, Estimated >60 >60 mL/min    Comment: (NOTE) Calculated using the CKD-EPI Creatinine Equation (2021)    Anion gap 4 (L) 5 - 15    Comment: Performed at [facility], [place] [address], [place], [place] [address]  Phosphorus     Status: Abnormal   Collection Time: 11/15/20  4:17 AM  Result Value Ref Range   Phosphorus 2.2 (L) 2.5 - 4.6 mg/dL    Comment: Performed at [facility], [place] [address], [place], [place] [address]   Magnesium     Status: None   Collection Time: 11/15/20  4:17 AM  Result Value Ref Range   Magnesium 2.0 1.7 - 2.4 mg/dL    Comment: Performed at [facility], [place] [address] [place], [place] [address]  Glucose, capillary     Status: Abnormal   Collection Time: 11/15/20  5:45 AM  Result Value Ref Range   Glucose-Capillary 160 (H) 70 - 99 mg/dL    Comment: Glucose reference range applies only to samples taken after fasting for at least 8 hours.  Glucose, capillary     Status: Abnormal   Collection Time: 11/15/20 11:29 AM  Result Value Ref Range   Glucose-Capillary 175 (H) 70 - 99 mg/dL    Comment: Glucose reference range applies only to samples taken after fasting for at least 8 hours.    Imaging / Studies: DG CHEST PORT 1 VIEW  Result Date: 11/13/2020 CLINICAL DATA:  Increased respiratory rate EXAM: PORTABLE CHEST 1 VIEW COMPARISON:  Nov 04, 2020 FINDINGS: The cardiomediastinal silhouette is unchanged in contour.Removal of enteric tube.  RIGHT upper extremity PICC tip terminates over the superior cavoatrial junction. Atherosclerotic calcifications. Biapical scarring. No pleural effusion. No pneumothorax. Bilateral hazy opacities, similar in comparison to prior. Visualized abdomen is unremarkable. Multilevel degenerative changes of the thoracic spine. Compression fracture of the inferior thoracic spine, unchanged. IMPRESSION: Similar appearance of hazy bilateral opacities. Differential considerations include infection, pulmonary edema or aspiration Electronically Signed   By: Valentino Saxon MD   On: 11/13/2020 16:16    Medications / Allergies: per chart  Antibiotics: Anti-infectives (From admission, onward)   Start     Dose/Rate Route Frequency Ordered Stop   11/15/20 1200  Ampicillin-Sulbactam (UNASYN) 3 g in sodium chloride 0.9 % 100 mL IVPB        3 g 200 mL/hr over 30 Minutes Intravenous Every 6 hours 11/15/20 1055 11/20/20 1159   11/11/20 1800  anidulafungin  (ERAXIS) 100 mg in sodium chloride 0.9 % 100 mL IVPB  Status:  Discontinued       "Followed by" Linked Group Details   100 mg 78 mL/hr over 100 Minutes Intravenous Every 24 hours 11/10/20 1622 11/15/20 0815   11/10/20 2200  piperacillin-tazobactam (ZOSYN) IVPB 3.375 g  Status:  Discontinued        3.375 g 12.5 mL/hr over 240 Minutes Intravenous Every 8 hours 11/10/20 1610 11/15/20 1035   11/10/20 1800  anidulafungin (ERAXIS) 200 mg in sodium chloride 0.9 % 200 mL IVPB       "Followed by" Linked Group Details   200 mg 78 mL/hr over 200 Minutes Intravenous  Once 11/10/20 1622 11/11/20 0016   11/10/20 1700  anidulafungin (ERAXIS) 100 mg in sodium chloride 0.9 % 100 mL IVPB  Status:  Discontinued        100 mg 78 mL/hr over 100 Minutes Intravenous Every 24 hours 11/10/20 1610 11/10/20 1621   11/10/20 1700  piperacillin-tazobactam (ZOSYN) IVPB 3.375 g        3.375 g 100 mL/hr over 30 Minutes Intravenous  Once 11/10/20 1618 11/10/20 1720   11/08/20 0045  vancomycin (VANCOCIN) 50 mg/mL oral solution 125 mg  Status:  Discontinued        125 mg Oral Every 6 hours 11/07/20 2354 11/08/20 1304   10/31/20 2200  cefoTEtan (CEFOTAN) 2 g in sodium chloride 0.9 % 100 mL IVPB        2 g 200 mL/hr over 30 Minutes Intravenous Every 12 hours 10/31/20 1744 10/31/20 2205   10/31/20 0915  cefoTEtan (CEFOTAN) 2 g in sodium chloride 0.9 % 100 mL IVPB        2 g 200 mL/hr over 30 Minutes Intravenous On call to O.R. 10/31/20 0824 10/31/20 1312        Note: Portions of this report may have been transcribed using voice recognition software. Every effort was made to ensure accuracy; however, inadvertent computerized transcription errors may be present.   Any transcriptional errors that result from this process are unintentional.    Adin Hector, MD, FACS, MASCRS  Esophageal, Gastrointestinal & Colorectal Surgery Robotic and Minimally Invasive Surgery [facility] [address], [place], [place] [address] [phone] Fax [phone] Main/Paging  CONTACT INFORMATION: Weekday (9AM-5PM) concerns: Call [facility] main office at [phone] Weeknight (5PM-9AM) or Weekend/Holiday concerns: Check [email or web address] for General Surgery [facility] coverage (Please, do not use SecureChat as it is not reliable communication to operating surgeons for immediate patient care)      11/15/2020  1:58 PM

## 2020-11-16 ENCOUNTER — Inpatient Hospital Stay (HOSPITAL_COMMUNITY): Payer: Medicare PPO

## 2020-11-16 ENCOUNTER — Ambulatory Visit: Payer: Medicare PPO | Admitting: Cardiovascular Disease

## 2020-11-16 ENCOUNTER — Other Ambulatory Visit: Payer: Self-pay

## 2020-11-16 DIAGNOSIS — C180 Malignant neoplasm of cecum: Secondary | ICD-10-CM | POA: Diagnosis not present

## 2020-11-16 LAB — BASIC METABOLIC PANEL
Anion gap: 7 (ref 5–15)
BUN: 20 mg/dL (ref 8–23)
CO2: 27 mmol/L (ref 22–32)
Calcium: 8.7 mg/dL — ABNORMAL LOW (ref 8.9–10.3)
Chloride: 100 mmol/L (ref 98–111)
Creatinine, Ser: 0.46 mg/dL (ref 0.44–1.00)
GFR, Estimated: >60 mL/min (ref >60)
Glucose, Bld: 157 mg/dL — ABNORMAL HIGH (ref 70–99)
Potassium: 4 mmol/L (ref 3.5–5.1)
Sodium: 134 mmol/L — ABNORMAL LOW (ref 135–145)

## 2020-11-16 LAB — CBC
HCT: 33.6 % — ABNORMAL LOW (ref 36.0–46.0)
Hemoglobin: 9.8 g/dL — ABNORMAL LOW (ref 12.0–15.0)
MCH: 23.3 pg — ABNORMAL LOW (ref 26.0–34.0)
MCHC: 29.2 g/dL — ABNORMAL LOW (ref 30.0–36.0)
MCV: 79.8 fL — ABNORMAL LOW (ref 80.0–100.0)
Platelets: 434 10*3/uL — ABNORMAL HIGH (ref 150–400)
RBC: 4.21 MIL/uL (ref 3.87–5.11)
RDW: 25.1 % — ABNORMAL HIGH (ref 11.5–15.5)
WBC: 12 10*3/uL — ABNORMAL HIGH (ref 4.0–10.5)
nRBC: 0 % (ref 0.0–0.2)

## 2020-11-16 LAB — GLUCOSE, CAPILLARY
Glucose-Capillary: 119 mg/dL — ABNORMAL HIGH (ref 70–99)
Glucose-Capillary: 145 mg/dL — ABNORMAL HIGH (ref 70–99)
Glucose-Capillary: 148 mg/dL — ABNORMAL HIGH (ref 70–99)
Glucose-Capillary: 154 mg/dL — ABNORMAL HIGH (ref 70–99)
Glucose-Capillary: 162 mg/dL — ABNORMAL HIGH (ref 70–99)
Glucose-Capillary: 175 mg/dL — ABNORMAL HIGH (ref 70–99)

## 2020-11-16 LAB — PHOSPHORUS: Phosphorus: 2.5 mg/dL (ref 2.5–4.6)

## 2020-11-16 LAB — HEMOGLOBIN: Hemoglobin: 9.1 g/dL — ABNORMAL LOW (ref 12.0–15.0)

## 2020-11-16 MED ORDER — TRAVASOL 10 % IV SOLN
INTRAVENOUS | Status: AC
  Filled 2020-11-16: qty 480

## 2020-11-16 MED ORDER — LOPERAMIDE HCL 2 MG PO CAPS
2.0000 mg | ORAL_CAPSULE | Freq: Two times a day (BID) | ORAL | Status: DC | PRN
  Administered 2020-11-16 – 2020-11-17 (×3): 2 mg via ORAL
  Filled 2020-11-16 (×3): qty 1

## 2020-11-16 MED ORDER — SODIUM CHLORIDE 0.9 % IV SOLN
3.0000 g | Freq: Four times a day (QID) | INTRAVENOUS | Status: AC
  Administered 2020-11-16 – 2020-11-17 (×3): 3 g via INTRAVENOUS
  Filled 2020-11-16 (×3): qty 3
  Filled 2020-11-16: qty 8

## 2020-11-16 NOTE — Progress Notes (Signed)
Report given to Coralyn Mark, RN on 3E.  Pt became yellow MEWS with SIRS score of 2 prior to transfer.  C/o nausea, refused PO med.  She did not eat much breakfast and abdomen was distended.  Brooke, [place] notified.  Zofran given per PRN orders and DG ABD ordered, verbal order to only let patient have liquids at this time.  Pt had a large amount of liquid, watery stool, incontinent on bed, floor, and in BSC mixed with some urine.  She did have some flatus after this BM.  Daughter [name] given status update via phone.  Pt was stable, stated she felt less nauseous, and resting comfortably in NAD at time of transfer.

## 2020-11-16 NOTE — Progress Notes (Signed)
PHARMACY - TOTAL PARENTERAL NUTRITION CONSULT NOTE   Indication: Small bowel obstruction; anticipate prolonged NPO status  Patient Measurements: Height: 5\' 4"  (162.6 cm) Weight: 64.5 kg (142 lb 1.6 oz) IBW/kg (Calculated) : 54.7 TPN AdjBW (KG): 65.4 Body mass index is 24.39 kg/m. Usual Weight: 66 kg (~10 lb weight loss this admission)  Assessment:  [name] with PMH Afib admitted 5/11 with anemia; found to have bleeding colonic mass suspicious for malignancy and underwent hemicolectomy 5/16. Developed ileus POD3 which initially improved, then worsened POD10. CT showed massive SBO and no immediate indication for surgery, so Pharmacy consulted to begin TPN for expected prolonged ileus.  Glucose / Insulin: no Hx DM; CBGs slightly elevated but stable in 150-200 range after advancing TPN - 12 units SSI required/24hr Electrolytes: Slight refeeding initially, but now K, Phos, Mg WNL; Na remains borderline low; Cl and Ca stable WNL Renal: SCr, BUN, bicarb; UOP remains good Hepatic: 5/30 Tbili improved to WNL; albumin low/stable; LFTs WNL, TG stable WNL Prealbumin: significantly low 5/27 (5.7), unchanged 5/20 (6.1) I/O: d/c NGT 5/29 - dCHF and diuresing since 5/29, no MIVF - still with diarrhea, but stool OP decreased significantly yesterday GI Imaging: - 5/13 colonoscopy: multiple polyps; larger masses at cecum and transverse colon - 5/14 MRI liver: no metastatic dz - 5/18 AXR: dilated SB loops; possible ileus - 5/26 CTa/p: severely dilated SB loops (not including ileocolonic anastomosis) concerning for distal SBO, also intramural and intrahepatic portal venous gas suggesting possible ischemic bowel Surgeries / Procedures:  - 5/16 laparoscopic hemicolectomy/cecectomy; omentopexy of ileocolonic anastomosis  Central access: PICC placed 5/27 TPN start date: 5/27  Nutritional Goals RD recs 5/27 Kcal:  1700-1900 kcal Protein:  80-95 grams Fluid:  >/= 2 L/day  TPN at goal rate of 75 mL/hr  provides 90 g of protein and 1818 kcals per day  Current Nutrition:  Advance diet to soft 6/1 TPN at 75 ml/hr - Surgery feels patient improving; may wean TPN to 1/2 rate on 6/1  Plan:   At 1800:  Reduce TPN to 1/2 rate per Surgery  Electrolytes in TPN: increasing Na, K, Phos with TPN cut to 1/2 rate (providing approx same as yesterday)  Na - 150 mEq  K - 70 mEq/L  Ca - 25mEq/L  Mg - 16mEq/L  Phos - 25 mmol/L  Cl:Ac ratio 1:1  Add standard MVI and trace elements to TPN  Continue SSI moderate scale ACHS CBG checks   MIVF per primary  Monitor TPN labs on Mon/Thurs  Robin [name], PharmD, BCPS [phone] 11/16/2020, 10:21 AM

## 2020-11-16 NOTE — Progress Notes (Signed)
[facility] Progress Note  Patient: Robin [name]    OZH:[id number]  DOA: 10/26/2020     Date of Service: the patient was seen and examined on 11/16/2020  Brief hospital course: Past medical history of paroxysmal A. fib on Eliquis, HTN.  Presents with complaints of anemia found to have colonic neoplasm.  Underwent laparoscopic hemicolectomy and reanastomosis on 5/16.  Postop developed ileus requiring prolonged TPN. Developed fever on 5/26 concerning for pneumatosis based on the work-up and started on broad-spectrum IV antibiotics. Currently plan is monitor for improvement.  Assessment and Plan: 1.  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  Biopsy was positive for invasive adenocarcinoma. SP laparoscopic proximal hemicolectomy with reanastomosis by Dr. Elinor Parkinson on 5/16. Surgical pathology with negative margins. Patient will require outpatient oncology follow-up. Management per surgery.  2.  Postop ileus Developed prolonged postop ileus requiring NG tube insertion as well as TPN. Currently ileus is improving although still has significant poor p.o. intake. TPN reduced on 6/1.  3.  Concern for pneumatosis Aspiration pneumonia 5/26 developed fever with tachycardia and leukocytosis. CT abdomen concerning for small bowel dilation and gastric distention as well as pneumatosis along the duodenum and portal venous gas. NG tube replaced patient was started on IV Zosyn antibiotics this. No further antibiotic needed per surgery. Will complete course for aspiration pneumonia.  4.  Acute on chronic GI bleeding. Iron deficiency anemia FOBT was positive. Hemoglobin stable for now. Continue replacing iron and B12.  5.  Paroxysmal A. fib with RVR Rate currently controlled. On Toprol-XL Cardizem and Eliquis. Monitor.  6.  Essential hypertension Blood pressure stable. Continue current regimen.  7.  Acute on chronic  diastolic CHF Patient appears to have volume overload postoperatively likely due to need for the TPN pending Intermittently requiring Lasix. Echocardiogram shows preserved EF without any wall motion abnormality. Monitor.  8.  Hypokalemia, hypophosphatemia, hypomagnesemia. Currently being replaced.  Monitor.  Body mass index is 24.39 kg/m.  Nutrition Problem: Increased nutrient needs Etiology: acute illness Interventions: Interventions: TPN  9.  Stage II right buttock pressure ulcer. POA status not clear. Continue home dressing. Pressure Injury 11/14/20 Buttocks Right Stage 2 -  Partial thickness loss of dermis presenting as a shallow open injury with a red, pink wound bed without slough. (Active)  11/14/20 2230  Location: Buttocks  Location Orientation: Right  Staging: Stage 2 -  Partial thickness loss of dermis presenting as a shallow open injury with a red, pink wound bed without slough.  Wound Description (Comments):   Present on Admission:      Diet: Regular diet DVT Prophylaxis:   SCDs Start: 10/26/20 2337 apixaban (ELIQUIS) tablet 5 mg    Advance goals of care discussion: Full code  Family Communication: no family was present at bedside, at the time of interview.   Disposition:  Status is: Inpatient  Remains inpatient appropriate because:IV treatments appropriate due to intensity of illness or inability to take PO   Dispo: The patient is from: Home              Anticipated d/c is to: SNF              Patient currently is not medically stable to d/c.   Difficult to place patient No        Subjective: No nausea no vomiting.  Pain well controlled.  Abdomen still distended.  Still short of breath.  Still cough.  Physical Exam:  General: Appear in mild distress, no Rash; Oral Mucosa Clear, moist. no Abnormal Neck Mass Or lumps, Conjunctiva normal  Cardiovascular: S1 and S2 Present, no Murmur, Respiratory: increased respiratory effort, Bilateral Air entry  present and bilateral  Crackles, no wheezes Abdomen: Bowel Sound present, Soft distended and no tenderness Extremities: trace Pedal edema Neurology: alert and oriented to time, place, and person affect appropriate. no new focal deficit Gait not checked due to patient safety concerns    Vitals:   11/15/20 0547 11/15/20 1337 11/15/20 2021 11/16/20 0609  BP: 134/62 138/75 137/61 (!) 163/97  Pulse: 84 89 75 (!) 106  Resp: 18 18 18    Temp: 97.8 F (36.6 C) 98.2 F (36.8 C) 98.3 F (36.8 C) 97.6 F (36.4 C)  TempSrc: Oral Oral Oral   SpO2: 96% 98% 98% 94%  Weight:    64.5 kg  Height:        Intake/Output Summary (Last 24 hours) at 11/16/2020 1221 Last data filed at 11/16/2020 0930 Gross per 24 hour  Intake 3039.51 ml  Output 1151 ml  Net 1888.51 ml   Filed Weights   11/14/20 0500 11/15/20 0500 11/16/20 0609  Weight: 64.3 kg 61.3 kg 64.5 kg    Data Reviewed: I have personally reviewed and interpreted daily labs, tele strips, imaging. I reviewed all nursing notes, pharmacy notes, vitals, pertinent old records I have discussed plan of care as described above with RN and patient/family.  CBC: Recent Labs  Lab 11/10/20 0438 11/11/20 0529 11/12/20 0710 11/13/20 0300 11/14/20 0355 11/16/20 0238  WBC 23.0* 17.0* 13.1* 11.8* 10.8*  --   NEUTROABS 21.7* 15.7*  --   --  8.4*  --   HGB 12.0 11.4* 9.4* 9.2* 9.3* 9.1*  HCT 40.3 37.8 31.6* 30.2* 31.0*  --   MCV 79.3* 79.4* 80.2 80.3 80.5  --   PLT 455* 441* 424* 405* 428*  --    Basic Metabolic Panel: Recent Labs  Lab 11/11/20 0529 11/12/20 0710 11/13/20 0300 11/14/20 0355 11/15/20 0417 11/16/20 0910  NA 136 137 136 136 133* 134*  K 3.7 3.4* 3.5 3.4* 3.5 4.0  CL 101 104 106 103 101 100  CO2 27 27 25 26 28 27   GLUCOSE 125* 112* 135* 156* 148* 157*  BUN 37* 28* 19 16 19 20   CREATININE 0.86 0.45 0.45 0.54 0.46 0.46  CALCIUM 9.1 8.6* 8.3* 8.6* 8.5* 8.7*  MG 2.1 2.1 2.0 1.9 2.0  --   PHOS 3.4 1.2* 2.3* 2.1* 2.2* 2.5     Studies: ECHOCARDIOGRAM COMPLETE  Result Date: 11/15/2020    ECHOCARDIOGRAM REPORT   Patient Name:   Robin [name] Date of Exam: 11/15/2020 Medical Rec #:  [id number]          Height:       64.0 in Accession #:    [id number]         Weight:       135.1 lb Date of Birth:  Jan 18, 1935         BSA:          1.656 m Patient Age:    86 years           BP:           134/62 mmHg Patient Gender: F                  HR:           84 bpm. Exam Location:  Inpatient Procedure: 2D  Echo Indications:    CHF-Acute Diastolic J24.26  History:        Patient has prior history of Echocardiogram examinations, most                 recent 08/28/2018. Arrythmias:Atrial Fibrillation; Risk                 Factors:Hypertension and Dyslipidemia. First Degree Heart Block,                 Colon cancer.  Sonographer:    Leavy Cella Referring Phys: [id number] RAVI PAHWANI IMPRESSIONS  1. Left ventricular ejection fraction, by estimation, is 60 to 65%. The left ventricle has normal function. The left ventricle has no regional wall motion abnormalities. Left ventricular diastolic parameters are indeterminate.  2. Right ventricular systolic function is normal. The right ventricular size is normal. There is normal pulmonary artery systolic pressure. The estimated right ventricular systolic pressure is 22.9 mmHg.  3. Right atrial size was mildly dilated.  4. The mitral valve is normal in structure. Trivial mitral valve regurgitation.  5. The aortic valve was not well visualized. Aortic valve regurgitation is not visualized. No aortic stenosis is present.  6. The inferior vena cava is normal in size with greater than 50% respiratory variability, suggesting right atrial pressure of 3 mmHg. FINDINGS  Left Ventricle: Left ventricular ejection fraction, by estimation, is 60 to 65%. The left ventricle has normal function. The left ventricle has no regional wall motion abnormalities. The left ventricular internal cavity size was small. There is no  left ventricular hypertrophy. Left ventricular diastolic parameters are indeterminate. Right Ventricle: The right ventricular size is normal. No increase in right ventricular wall thickness. Right ventricular systolic function is normal. There is normal pulmonary artery systolic pressure. The tricuspid regurgitant velocity is 2.55 m/s, and  with an assumed right atrial pressure of 3 mmHg, the estimated right ventricular systolic pressure is 79.8 mmHg. Left Atrium: Left atrial size was normal in size. Right Atrium: Right atrial size was mildly dilated. Pericardium: There is no evidence of pericardial effusion. Mitral Valve: The mitral valve is normal in structure. Trivial mitral valve regurgitation. Tricuspid Valve: The tricuspid valve is normal in structure. Tricuspid valve regurgitation is trivial. Aortic Valve: The aortic valve was not well visualized. Aortic valve regurgitation is not visualized. No aortic stenosis is present. Pulmonic Valve: The pulmonic valve was not well visualized. Pulmonic valve regurgitation is not visualized. Aorta: The aortic root and ascending aorta are structurally normal, with no evidence of dilitation. Venous: The inferior vena cava is normal in size with greater than 50% respiratory variability, suggesting right atrial pressure of 3 mmHg. IAS/Shunts: The interatrial septum was not well visualized.  LEFT VENTRICLE PLAX 2D LVIDd:         3.53 cm  Diastology LVIDs:         2.19 cm  LV e' medial:    8.70 cm/s LV PW:         0.90 cm  LV E/e' medial:  13.1 LV IVS:        0.95 cm  LV e' lateral:   10.80 cm/s LVOT diam:     1.80 cm  LV E/e' lateral: 10.6 LVOT Area:     2.54 cm  RIGHT VENTRICLE RV S prime:     13.70 cm/s TAPSE (M-mode): 1.4 cm LEFT ATRIUM             Index       RIGHT ATRIUM  Index LA diam:        3.50 cm 2.11 cm/m  RA Area:     17.70 cm LA Vol (A2C):   46.1 ml 27.84 ml/m RA Volume:   46.20 ml  27.90 ml/m LA Vol (A4C):   29.1 ml 17.57 ml/m LA Biplane Vol: 37.9  ml 22.88 ml/m   AORTA Ao Root diam: 2.70 cm MITRAL VALVE                TRICUSPID VALVE MV Area (PHT): 3.53 cm     TR Peak grad:   26.0 mmHg MV Decel Time: 215 msec     TR Vmax:        255.00 cm/s MV E velocity: 114.00 cm/s MV A velocity: 43.70 cm/s   SHUNTS MV E/A ratio:  2.61         Systemic Diam: 1.80 cm Oswaldo Milian MD Electronically signed by Oswaldo Milian MD Signature Date/Time: 11/15/2020/2:37:09 PM    Final     Scheduled Meds: . apixaban  5 mg Oral BID  . Chlorhexidine Gluconate Cloth  6 each Topical Daily  . diltiazem  180 mg Oral Daily  . feeding supplement  1 Container Oral TID BM  . ferrous sulfate  325 mg Oral Q breakfast  . insulin aspart  0-15 Units Subcutaneous TID WC  . insulin aspart  0-5 Units Subcutaneous QHS  . lip balm  1 application Topical BID  . liver oil-zinc oxide   Topical BID  . metoprolol succinate  50 mg Oral BID  . pantoprazole  40 mg Oral Q1200  . phosphorus  500 mg Oral BID  . polycarbophil  625 mg Oral BID  . sodium chloride flush  10-40 mL Intracatheter Q12H   Continuous Infusions: . ampicillin-sulbactam (UNASYN) IV 3 g (11/16/20 1214)  . methocarbamol (ROBAXIN) IV    . TPN ADULT (ION) 75 mL/hr at 11/15/20 1731  . TPN ADULT (ION)     PRN Meds: acetaminophen, acetaminophen, alum & mag hydroxide-simeth, artificial tears, bisacodyl, fentaNYL (SUBLIMAZE) injection, loperamide, LORazepam, magic mouthwash, menthol-cetylpyridinium, methocarbamol (ROBAXIN) IV, metoprolol tartrate, ondansetron **OR** ondansetron (ZOFRAN) IV, phenol, prochlorperazine **OR** prochlorperazine, sodium chloride flush, traMADol  Time spent: 35 minutes  Author: Berle Mull, MD [facility] 11/16/2020 12:21 PM  To reach On-call, see care teams to locate the attending and reach out via [email or web address]. Between 7PM-7AM, please contact night-coverage If you still have difficulty reaching the attending provider, please page the [facility] (Director on Call) for [facility] on amion for assistance.

## 2020-11-16 NOTE — Progress Notes (Signed)
The proposed treatment discussed in conference is for discussion purposes only and is not a binding recommendation.  The patients have not been physically examined, or presented with their treatment options.  Therefore, final treatment plans cannot be decided.   

## 2020-11-16 NOTE — Plan of Care (Signed)
°  Problem: Activity: °Goal: Risk for activity intolerance will decrease °Outcome: Progressing °  °Problem: Nutrition: °Goal: Adequate nutrition will be maintained °Outcome: Progressing °  °Problem: Skin Integrity: °Goal: Risk for impaired skin integrity will decrease °Outcome: Progressing °  °

## 2020-11-16 NOTE — Progress Notes (Signed)
Arrived to room to draw labwork from PICC. Pt. In process of being rolled down hall in bed to another room. Will come back after pt. Gets to her new room.

## 2020-11-16 NOTE — Progress Notes (Addendum)
[facility] Progress Note  16 Days Post-Op  Subjective: CC-  Sitting up in bed eating breakfast. Appetite low but tolerating what she is eating. Denies any n/v. Continues to have diarrhea but thinks it is slowly improving. Only 1 loose stool so far this morning.  Objective: Vital signs in last 24 hours: Temp:  [97.6 F (36.4 C)-98.3 F (36.8 C)] 97.6 F (36.4 C) (06/01 0609) Pulse Rate:  [75-106] 106 (06/01 0609) Resp:  [18] 18 (05/31 2021) BP: (137-163)/(61-97) 163/97 (06/01 0609) SpO2:  [94 %-98 %] 94 % (06/01 0609) Weight:  [64.5 kg] 64.5 kg (06/01 0609) Last BM Date: 11/15/20  Intake/Output from previous day: 05/31 0701 - 06/01 0700 In: 3942.4 [P.O.:1100; I.V.:1943.9; IV Piggyback:818.5] Out: 1951 [Urine:1700; Stool:251] Intake/Output this shift: No intake/output data recorded.  PE: Gen:  Alert, NAD, pleasant Pulm: rate and effort normal on 1L [place] Abd: Soft, mild distension, nontender, +BS, incisions cdi Skin: no rashes noted, warm and dry  Lab Results:  Recent Labs    11/14/20 0355 11/16/20 0238  WBC 10.8*  --   HGB 9.3* 9.1*  HCT 31.0*  --   PLT 428*  --    BMET Recent Labs    11/14/20 0355 11/15/20 0417  NA 136 133*  K 3.4* 3.5  CL 103 101  CO2 26 28  GLUCOSE 156* 148*  BUN 16 19  CREATININE 0.54 0.46  CALCIUM 8.6* 8.5*   PT/INR No results for input(s): LABPROT, INR in the last 72 hours. CMP     Component Value Date/Time   NA 133 (L) 11/15/2020 0417   NA 139 02/21/2018 0854   K 3.5 11/15/2020 0417   CL 101 11/15/2020 0417   CO2 28 11/15/2020 0417   GLUCOSE 148 (H) 11/15/2020 0417   BUN 19 11/15/2020 0417   BUN 20 02/21/2018 0854   CREATININE 0.46 11/15/2020 0417   CREATININE 0.54 (L) 11/28/2015 1422   CALCIUM 8.5 (L) 11/15/2020 0417   PROT 5.1 (L) 11/14/2020 0355   ALBUMIN 2.2 (L) 11/14/2020 0355   AST 14 (L) 11/14/2020 0355   ALT 12 11/14/2020 0355   ALKPHOS 98 11/14/2020 0355   BILITOT 0.7 11/14/2020 0355   GFRNONAA  >60 11/15/2020 0417   GFRAA 78 02/21/2018 0854   Lipase     Component Value Date/Time   LIPASE 31 10/26/2020 1919       Studies/Results: ECHOCARDIOGRAM COMPLETE  Result Date: 11/15/2020    ECHOCARDIOGRAM REPORT   Patient Name:   Robin [name] Date of Exam: 11/15/2020 Medical Rec #:  [id number]          Height:       64.0 in Accession #:    [id number]         Weight:       135.1 lb Date of Birth:  09/27/1934         BSA:          1.656 m Patient Age:    86 years           BP:           134/62 mmHg Patient Gender: F                  HR:           84 bpm. Exam Location:  Inpatient Procedure: 2D Echo Indications:    CHF-Acute Diastolic H06.23  History:        Patient has prior history of Echocardiogram  examinations, most                 recent 08/28/2018. Arrythmias:Atrial Fibrillation; Risk                 Factors:Hypertension and Dyslipidemia. First Degree Heart Block,                 Colon cancer.  Sonographer:    Leavy Cella Referring Phys: [id number] RAVI PAHWANI IMPRESSIONS  1. Left ventricular ejection fraction, by estimation, is 60 to 65%. The left ventricle has normal function. The left ventricle has no regional wall motion abnormalities. Left ventricular diastolic parameters are indeterminate.  2. Right ventricular systolic function is normal. The right ventricular size is normal. There is normal pulmonary artery systolic pressure. The estimated right ventricular systolic pressure is 45.4 mmHg.  3. Right atrial size was mildly dilated.  4. The mitral valve is normal in structure. Trivial mitral valve regurgitation.  5. The aortic valve was not well visualized. Aortic valve regurgitation is not visualized. No aortic stenosis is present.  6. The inferior vena cava is normal in size with greater than 50% respiratory variability, suggesting right atrial pressure of 3 mmHg. FINDINGS  Left Ventricle: Left ventricular ejection fraction, by estimation, is 60 to 65%. The left ventricle has normal  function. The left ventricle has no regional wall motion abnormalities. The left ventricular internal cavity size was small. There is no left ventricular hypertrophy. Left ventricular diastolic parameters are indeterminate. Right Ventricle: The right ventricular size is normal. No increase in right ventricular wall thickness. Right ventricular systolic function is normal. There is normal pulmonary artery systolic pressure. The tricuspid regurgitant velocity is 2.55 m/s, and  with an assumed right atrial pressure of 3 mmHg, the estimated right ventricular systolic pressure is 09.8 mmHg. Left Atrium: Left atrial size was normal in size. Right Atrium: Right atrial size was mildly dilated. Pericardium: There is no evidence of pericardial effusion. Mitral Valve: The mitral valve is normal in structure. Trivial mitral valve regurgitation. Tricuspid Valve: The tricuspid valve is normal in structure. Tricuspid valve regurgitation is trivial. Aortic Valve: The aortic valve was not well visualized. Aortic valve regurgitation is not visualized. No aortic stenosis is present. Pulmonic Valve: The pulmonic valve was not well visualized. Pulmonic valve regurgitation is not visualized. Aorta: The aortic root and ascending aorta are structurally normal, with no evidence of dilitation. Venous: The inferior vena cava is normal in size with greater than 50% respiratory variability, suggesting right atrial pressure of 3 mmHg. IAS/Shunts: The interatrial septum was not well visualized.  LEFT VENTRICLE PLAX 2D LVIDd:         3.53 cm  Diastology LVIDs:         2.19 cm  LV e' medial:    8.70 cm/s LV PW:         0.90 cm  LV E/e' medial:  13.1 LV IVS:        0.95 cm  LV e' lateral:   10.80 cm/s LVOT diam:     1.80 cm  LV E/e' lateral: 10.6 LVOT Area:     2.54 cm  RIGHT VENTRICLE RV S prime:     13.70 cm/s TAPSE (M-mode): 1.4 cm LEFT ATRIUM             Index       RIGHT ATRIUM           Index LA diam:        3.50 cm 2.11 cm/m  RA Area:      17.70 cm LA Vol (A2C):   46.1 ml 27.84 ml/m RA Volume:   46.20 ml  27.90 ml/m LA Vol (A4C):   29.1 ml 17.57 ml/m LA Biplane Vol: 37.9 ml 22.88 ml/m   AORTA Ao Root diam: 2.70 cm MITRAL VALVE                TRICUSPID VALVE MV Area (PHT): 3.53 cm     TR Peak grad:   26.0 mmHg MV Decel Time: 215 msec     TR Vmax:        255.00 cm/s MV E velocity: 114.00 cm/s MV A velocity: 43.70 cm/s   SHUNTS MV E/A ratio:  2.61         Systemic Diam: 1.80 cm Oswaldo Milian MD Electronically signed by Oswaldo Milian MD Signature Date/Time: 11/15/2020/2:37:09 PM    Final     Anti-infectives: Anti-infectives (From admission, onward)   Start     Dose/Rate Route Frequency Ordered Stop   11/16/20 1200  Ampicillin-Sulbactam (UNASYN) 3 g in sodium chloride 0.9 % 100 mL IVPB        3 g 200 mL/hr over 30 Minutes Intravenous Every 6 hours 11/16/20 0755 11/17/20 0559   11/15/20 1200  Ampicillin-Sulbactam (UNASYN) 3 g in sodium chloride 0.9 % 100 mL IVPB  Status:  Discontinued        3 g 200 mL/hr over 30 Minutes Intravenous Every 6 hours 11/15/20 1055 11/16/20 0755   11/11/20 1800  anidulafungin (ERAXIS) 100 mg in sodium chloride 0.9 % 100 mL IVPB  Status:  Discontinued       "Followed by" Linked Group Details   100 mg 78 mL/hr over 100 Minutes Intravenous Every 24 hours 11/10/20 1622 11/15/20 0815   11/10/20 2200  piperacillin-tazobactam (ZOSYN) IVPB 3.375 g  Status:  Discontinued        3.375 g 12.5 mL/hr over 240 Minutes Intravenous Every 8 hours 11/10/20 1610 11/15/20 1035   11/10/20 1800  anidulafungin (ERAXIS) 200 mg in sodium chloride 0.9 % 200 mL IVPB       "Followed by" Linked Group Details   200 mg 78 mL/hr over 200 Minutes Intravenous  Once 11/10/20 1622 11/11/20 0016   11/10/20 1700  anidulafungin (ERAXIS) 100 mg in sodium chloride 0.9 % 100 mL IVPB  Status:  Discontinued        100 mg 78 mL/hr over 100 Minutes Intravenous Every 24 hours 11/10/20 1610 11/10/20 1621   11/10/20 1700   piperacillin-tazobactam (ZOSYN) IVPB 3.375 g        3.375 g 100 mL/hr over 30 Minutes Intravenous  Once 11/10/20 1618 11/10/20 1720   11/08/20 0045  vancomycin (VANCOCIN) 50 mg/mL oral solution 125 mg  Status:  Discontinued        125 mg Oral Every 6 hours 11/07/20 2354 11/08/20 1304   10/31/20 2200  cefoTEtan (CEFOTAN) 2 g in sodium chloride 0.9 % 100 mL IVPB        2 g 200 mL/hr over 30 Minutes Intravenous Every 12 hours 10/31/20 1744 10/31/20 2205   10/31/20 0915  cefoTEtan (CEFOTAN) 2 g in sodium chloride 0.9 % 100 mL IVPB        2 g 200 mL/hr over 30 Minutes Intravenous On call to O.R. 10/31/20 0824 10/31/20 1312       Assessment/Plan Atrial fibrillation- stable back on home meds toprol,cardizem, eliquis HTN HLD Diarrhea - c diffnegative5/23 Severe malnutrition - prealbumin 5.7 (  5/27) >> 6.1 (5/30), advancing diet and weaning TPN Possible aspiration PNA - on unasyn --per TRH--  POD#16-s/p lap assisted right hemicolectomy for ascending colon mass (adenocarcinoma) and multiple polyps (likely pre-malignant), Dr. Johney Maine 10/31/20 - Wean TPN to half rate and continue soft diet as well as protein supplements. On fiber and iron supplement for diarrhea, imodium BID PRN. Will add antidiarrheals very slowly in this patient. Monitor electrolytes (labs pending this morning) - Continue mobilizing, PT/OT. Currently recommending SNF - patient agreeable and sounds like she has a plan for [facility] when medically ready for discharge - Transfer to med-surg floor  Acute blood loss anemia in setting of chronic GI losses from cancer.  H/h stable since restarting eliquis 5/30. Continue supplemental iron  FEN - soft diet, Boost TID, wean TPN to 1/2 rate VTE -Eliquis ID -Cefotetanpre-op, PO vancomycin 5/24>>5/24, zosyn 5/26>>5/31, eraxis 5/26>>5/31, unasyn 5/31>> (for PNA)   LOS: 20 days    Wellington Hampshire, [facility] 11/16/2020, 9:20 AM Please see Amion for pager  number during day hours 7:00am-4:30pm

## 2020-11-17 DIAGNOSIS — C180 Malignant neoplasm of cecum: Secondary | ICD-10-CM | POA: Diagnosis not present

## 2020-11-17 LAB — CBC
HCT: 29.8 % — ABNORMAL LOW (ref 36.0–46.0)
Hemoglobin: 9 g/dL — ABNORMAL LOW (ref 12.0–15.0)
MCH: 24.1 pg — ABNORMAL LOW (ref 26.0–34.0)
MCHC: 30.2 g/dL (ref 30.0–36.0)
MCV: 79.9 fL — ABNORMAL LOW (ref 80.0–100.0)
Platelets: 389 10*3/uL (ref 150–400)
RBC: 3.73 MIL/uL — ABNORMAL LOW (ref 3.87–5.11)
RDW: 25.2 % — ABNORMAL HIGH (ref 11.5–15.5)
WBC: 9.4 10*3/uL (ref 4.0–10.5)
nRBC: 0 % (ref 0.0–0.2)

## 2020-11-17 LAB — MAGNESIUM: Magnesium: 2 mg/dL (ref 1.7–2.4)

## 2020-11-17 LAB — COMPREHENSIVE METABOLIC PANEL
ALT: 20 U/L (ref 0–44)
AST: 21 U/L (ref 15–41)
Albumin: 2.1 g/dL — ABNORMAL LOW (ref 3.5–5.0)
Alkaline Phosphatase: 138 U/L — ABNORMAL HIGH (ref 38–126)
Anion gap: 4 — ABNORMAL LOW (ref 5–15)
BUN: 22 mg/dL (ref 8–23)
CO2: 28 mmol/L (ref 22–32)
Calcium: 8.9 mg/dL (ref 8.9–10.3)
Chloride: 103 mmol/L (ref 98–111)
Creatinine, Ser: 0.47 mg/dL (ref 0.44–1.00)
GFR, Estimated: >60 mL/min (ref >60)
Glucose, Bld: 126 mg/dL — ABNORMAL HIGH (ref 70–99)
Potassium: 3.9 mmol/L (ref 3.5–5.1)
Sodium: 135 mmol/L (ref 135–145)
Total Bilirubin: 0.5 mg/dL (ref 0.3–1.2)
Total Protein: 5 g/dL — ABNORMAL LOW (ref 6.5–8.1)

## 2020-11-17 LAB — PHOSPHORUS: Phosphorus: 2.7 mg/dL (ref 2.5–4.6)

## 2020-11-17 LAB — GLUCOSE, CAPILLARY
Glucose-Capillary: 118 mg/dL — ABNORMAL HIGH (ref 70–99)
Glucose-Capillary: 118 mg/dL — ABNORMAL HIGH (ref 70–99)

## 2020-11-17 MED ORDER — FAMOTIDINE 40 MG/5ML PO SUSR
20.0000 mg | Freq: Every day | ORAL | Status: DC
  Administered 2020-11-18 – 2020-11-29 (×12): 20 mg via ORAL
  Filled 2020-11-17 (×13): qty 2.5

## 2020-11-17 MED ORDER — TRAVASOL 10 % IV SOLN
INTRAVENOUS | Status: AC
  Filled 2020-11-17: qty 480

## 2020-11-17 MED ORDER — FLORANEX PO PACK
1.0000 g | PACK | Freq: Three times a day (TID) | ORAL | Status: DC
  Administered 2020-11-17: 1 g via ORAL
  Filled 2020-11-17 (×2): qty 1

## 2020-11-17 MED ORDER — METOPROLOL SUCCINATE ER 50 MG PO TB24: 50.0000 mg | ORAL_TABLET | Freq: Every day | ORAL | Status: AC

## 2020-11-17 MED ORDER — KATE FARMS STANDARD 1.4 PO LIQD
325.0000 mL | Freq: Two times a day (BID) | ORAL | Status: DC
  Administered 2020-11-17: 325 mL via ORAL
  Filled 2020-11-17 (×2): qty 325

## 2020-11-17 MED ORDER — METOPROLOL SUCCINATE ER 50 MG PO TB24
100.0000 mg | ORAL_TABLET | Freq: Every day | ORAL | Status: DC
  Administered 2020-11-18 – 2020-11-29 (×12): 100 mg via ORAL
  Filled 2020-11-17 (×12): qty 2

## 2020-11-17 MED ORDER — POTASSIUM PHOSPHATES 15 MMOLE/5ML IV SOLN
15.0000 mmol | Freq: Once | INTRAVENOUS | Status: AC
  Administered 2020-11-17: 15 mmol via INTRAVENOUS
  Filled 2020-11-17: qty 5

## 2020-11-17 MED ORDER — SIMETHICONE 40 MG/0.6ML PO SUSP
40.0000 mg | Freq: Four times a day (QID) | ORAL | Status: DC
  Administered 2020-11-17 – 2020-11-22 (×17): 40 mg via ORAL
  Filled 2020-11-17 (×24): qty 0.6

## 2020-11-17 NOTE — TOC Progression Note (Signed)
Transition of Care [facility]) - Progression Note   Patient Details  Name: Robin [name] MRN: [id number] Date of Birth: 03/15/1935  Transition of Care [facility]) CM/SW [place], LCSW Phone Number: 11/17/2020, 1:16 PM  Clinical Narrative: CSW spoke with patient's daughter, Salena Saner, regarding SNF. Per daughter, she previously spoke with admissions at [facility] and the facility was considering the patient for rehab after she is weaned off TPN.  CSW spoke with [name] in admissions at [facility] and the facility is going to accept her for SNF when medically ready. Kelly requested FL2 so that patient's information can be reviewed beforehand. FL2 done; PASRR received. Initial referral and therapy notes faxed to [facility]. CSW confirmed patient is managed by Bernadene Bell. TOC to follow.  Expected Discharge Plan: Skilled Nursing Facility Barriers to Discharge: Continued Medical Work up  Expected Discharge Plan and Services Expected Discharge Plan: [place] Discharge Planning Services: CM Consult Living arrangements for the past 2 months: Single Family Home  Readmission Risk Interventions Readmission Risk Prevention Plan 11/17/2020  Transportation Screening Complete  HRI or Home Care Consult Complete  Social Work Consult for [place] Planning/Counseling Complete  Palliative Care Screening Not Applicable  Medication Review Press photographer) Complete  Some recent data might be hidden

## 2020-11-17 NOTE — Progress Notes (Signed)
PT Cancellation Note  Patient Details Name: Robin [name] MRN: [id number] DOB: [birth year]   Cancelled Treatment:    Reason Eval/Treat Not Completed: Other (comment) Pt currently on BSC, nursing reports frequent uncontrolled loose stools.    Piper Albro,KATHrine E 11/17/2020, 1:44 PM Jannette Spanner PT, DPT Acute Rehabilitation Services Pager: [phone] Office: [phone]

## 2020-11-17 NOTE — Progress Notes (Signed)
Progress Note  17 Days Post-Op  Subjective: CC: she had 6 loose BMs yesterday. Improved with imodium. None yet this morning. No nausea or emesis and bloating has stayed about the same. She still has low appetite and PO intake but her daughter is bringing chicken noodle soup which she is excited about  Has worked with OT today  Objective: Vital signs in last 24 hours: Temp:  [97.4 F (36.3 C)-98.4 F (36.9 C)] 97.4 F (36.3 C) (06/02 0643) Pulse Rate:  [59-110] 91 (06/02 0643) Resp:  [16-26] 18 (06/02 0643) BP: (130-170)/(52-72) 137/63 (06/02 0643) SpO2:  [93 %-98 %] 95 % (06/02 0643) Last BM Date: 11/16/20  Intake/Output from previous day: 06/01 0701 - 06/02 0700 In: 2241 [P.O.:600; I.V.:1141; IV Piggyback:500] Out: 500 [Urine:500] Intake/Output this shift: Total I/O In: 240 [P.O.:240] Out: 200 [Urine:200]  PE: Gen:  Alert, NAD, pleasant. Sitting up in chair Pulm: rate and effort normal on room air Abd: Soft, mild distension, nontender, +BS, incisions cdi Skin: no rashes noted, warm and dry   Lab Results:  Recent Labs    11/16/20 1320 11/17/20 0312  WBC 12.0* 9.4  HGB 9.8* 9.0*  HCT 33.6* 29.8*  PLT 434* 389   BMET Recent Labs    11/16/20 0910 11/17/20 0312  NA 134* 135  K 4.0 3.9  CL 100 103  CO2 27 28  GLUCOSE 157* 126*  BUN 20 22  CREATININE 0.46 0.47  CALCIUM 8.7* 8.9   PT/INR No results for input(s): LABPROT, INR in the last 72 hours. CMP     Component Value Date/Time   NA 135 11/17/2020 0312   NA 139 02/21/2018 0854   K 3.9 11/17/2020 0312   CL 103 11/17/2020 0312   CO2 28 11/17/2020 0312   GLUCOSE 126 (H) 11/17/2020 0312   BUN 22 11/17/2020 0312   BUN 20 02/21/2018 0854   CREATININE 0.47 11/17/2020 0312   CREATININE 0.54 (L) 11/28/2015 1422   CALCIUM 8.9 11/17/2020 0312   PROT 5.0 (L) 11/17/2020 0312   ALBUMIN 2.1 (L) 11/17/2020 0312   AST 21 11/17/2020 0312   ALT 20 11/17/2020 0312   ALKPHOS 138 (H) 11/17/2020 0312    BILITOT 0.5 11/17/2020 0312   GFRNONAA >60 11/17/2020 0312   GFRAA 78 02/21/2018 0854   Lipase     Component Value Date/Time   LIPASE 31 10/26/2020 1919       Studies/Results: DG Abd Portable 1V  Result Date: 11/16/2020 CLINICAL DATA:  Cecal cancer with hemicolectomy 10/31/2020.  Nausea. EXAM: PORTABLE ABDOMEN - 1 VIEW COMPARISON:  11/11/2020 FINDINGS: NG tube removed. Mildly distended bowel loops predominately small bowel. Upright view not obtained evaluate for free air Surgical clips in the gallbladder fossa. Lumbar scoliosis and disc degeneration. Mild atherosclerotic aorta. IMPRESSION: Mildly distended small bowel loops may represent small-bowel obstruction or ileus. Electronically Signed   By: Franchot Gallo M.D.   On: 11/16/2020 15:16   ECHOCARDIOGRAM COMPLETE  Result Date: 11/15/2020    ECHOCARDIOGRAM REPORT   Patient Name:   Robin [name] Date of Exam: 11/15/2020 Medical Rec #:  [id number]          Height:       64.0 in Accession #:    [id number]         Weight:       135.1 lb Date of Birth:  Dec 17, 1934         BSA:          1.656  m Patient Age:    86 years           BP:           134/62 mmHg Patient Gender: F                  HR:           84 bpm. Exam Location:  Inpatient Procedure: 2D Echo Indications:    CHF-Acute Diastolic L46.50  History:        Patient has prior history of Echocardiogram examinations, most                 recent 08/28/2018. Arrythmias:Atrial Fibrillation; Risk                 Factors:Hypertension and Dyslipidemia. First Degree Heart Block,                 Colon cancer.  Sonographer:    Leavy Cella Referring Phys: [id number] RAVI PAHWANI IMPRESSIONS  1. Left ventricular ejection fraction, by estimation, is 60 to 65%. The left ventricle has normal function. The left ventricle has no regional wall motion abnormalities. Left ventricular diastolic parameters are indeterminate.  2. Right ventricular systolic function is normal. The right ventricular size is normal.  There is normal pulmonary artery systolic pressure. The estimated right ventricular systolic pressure is 12.7 mmHg.  3. Right atrial size was mildly dilated.  4. The mitral valve is normal in structure. Trivial mitral valve regurgitation.  5. The aortic valve was not well visualized. Aortic valve regurgitation is not visualized. No aortic stenosis is present.  6. The inferior vena cava is normal in size with greater than 50% respiratory variability, suggesting right atrial pressure of 3 mmHg. FINDINGS  Left Ventricle: Left ventricular ejection fraction, by estimation, is 60 to 65%. The left ventricle has normal function. The left ventricle has no regional wall motion abnormalities. The left ventricular internal cavity size was small. There is no left ventricular hypertrophy. Left ventricular diastolic parameters are indeterminate. Right Ventricle: The right ventricular size is normal. No increase in right ventricular wall thickness. Right ventricular systolic function is normal. There is normal pulmonary artery systolic pressure. The tricuspid regurgitant velocity is 2.55 m/s, and  with an assumed right atrial pressure of 3 mmHg, the estimated right ventricular systolic pressure is 51.7 mmHg. Left Atrium: Left atrial size was normal in size. Right Atrium: Right atrial size was mildly dilated. Pericardium: There is no evidence of pericardial effusion. Mitral Valve: The mitral valve is normal in structure. Trivial mitral valve regurgitation. Tricuspid Valve: The tricuspid valve is normal in structure. Tricuspid valve regurgitation is trivial. Aortic Valve: The aortic valve was not well visualized. Aortic valve regurgitation is not visualized. No aortic stenosis is present. Pulmonic Valve: The pulmonic valve was not well visualized. Pulmonic valve regurgitation is not visualized. Aorta: The aortic root and ascending aorta are structurally normal, with no evidence of dilitation. Venous: The inferior vena cava is normal  in size with greater than 50% respiratory variability, suggesting right atrial pressure of 3 mmHg. IAS/Shunts: The interatrial septum was not well visualized.  LEFT VENTRICLE PLAX 2D LVIDd:         3.53 cm  Diastology LVIDs:         2.19 cm  LV e' medial:    8.70 cm/s LV PW:         0.90 cm  LV E/e' medial:  13.1 LV IVS:  0.95 cm  LV e' lateral:   10.80 cm/s LVOT diam:     1.80 cm  LV E/e' lateral: 10.6 LVOT Area:     2.54 cm  RIGHT VENTRICLE RV S prime:     13.70 cm/s TAPSE (M-mode): 1.4 cm LEFT ATRIUM             Index       RIGHT ATRIUM           Index LA diam:        3.50 cm 2.11 cm/m  RA Area:     17.70 cm LA Vol (A2C):   46.1 ml 27.84 ml/m RA Volume:   46.20 ml  27.90 ml/m LA Vol (A4C):   29.1 ml 17.57 ml/m LA Biplane Vol: 37.9 ml 22.88 ml/m   AORTA Ao Root diam: 2.70 cm MITRAL VALVE                TRICUSPID VALVE MV Area (PHT): 3.53 cm     TR Peak grad:   26.0 mmHg MV Decel Time: 215 msec     TR Vmax:        255.00 cm/s MV E velocity: 114.00 cm/s MV A velocity: 43.70 cm/s   SHUNTS MV E/A ratio:  2.61         Systemic Diam: 1.80 cm Oswaldo Milian MD Electronically signed by Oswaldo Milian MD Signature Date/Time: 11/15/2020/2:37:09 PM    Final     Anti-infectives: Anti-infectives (From admission, onward)   Start     Dose/Rate Route Frequency Ordered Stop   11/16/20 1200  Ampicillin-Sulbactam (UNASYN) 3 g in sodium chloride 0.9 % 100 mL IVPB        3 g 200 mL/hr over 30 Minutes Intravenous Every 6 hours 11/16/20 0755 11/17/20 0840   11/15/20 1200  Ampicillin-Sulbactam (UNASYN) 3 g in sodium chloride 0.9 % 100 mL IVPB  Status:  Discontinued        3 g 200 mL/hr over 30 Minutes Intravenous Every 6 hours 11/15/20 1055 11/16/20 0755   11/11/20 1800  anidulafungin (ERAXIS) 100 mg in sodium chloride 0.9 % 100 mL IVPB  Status:  Discontinued       "Followed by" Linked Group Details   100 mg 78 mL/hr over 100 Minutes Intravenous Every 24 hours 11/10/20 1622 11/15/20 0815    11/10/20 2200  piperacillin-tazobactam (ZOSYN) IVPB 3.375 g  Status:  Discontinued        3.375 g 12.5 mL/hr over 240 Minutes Intravenous Every 8 hours 11/10/20 1610 11/15/20 1035   11/10/20 1800  anidulafungin (ERAXIS) 200 mg in sodium chloride 0.9 % 200 mL IVPB       "Followed by" Linked Group Details   200 mg 78 mL/hr over 200 Minutes Intravenous  Once 11/10/20 1622 11/11/20 0016   11/10/20 1700  anidulafungin (ERAXIS) 100 mg in sodium chloride 0.9 % 100 mL IVPB  Status:  Discontinued        100 mg 78 mL/hr over 100 Minutes Intravenous Every 24 hours 11/10/20 1610 11/10/20 1621   11/10/20 1700  piperacillin-tazobactam (ZOSYN) IVPB 3.375 g        3.375 g 100 mL/hr over 30 Minutes Intravenous  Once 11/10/20 1618 11/10/20 1720   11/08/20 0045  vancomycin (VANCOCIN) 50 mg/mL oral solution 125 mg  Status:  Discontinued        125 mg Oral Every 6 hours 11/07/20 2354 11/08/20 1304   10/31/20 2200  cefoTEtan (CEFOTAN) 2 g in sodium chloride  0.9 % 100 mL IVPB        2 g 200 mL/hr over 30 Minutes Intravenous Every 12 hours 10/31/20 1744 10/31/20 2205   10/31/20 0915  cefoTEtan (CEFOTAN) 2 g in sodium chloride 0.9 % 100 mL IVPB        2 g 200 mL/hr over 30 Minutes Intravenous On call to O.R. 10/31/20 0824 10/31/20 1312       Assessment/Plan  POD#17-s/p lap assisted right hemicolectomy for ascending colon mass (adenocarcinoma) and multiple polyps (likely pre-malignant), Dr. Johney Maine 10/31/20 - Continue TPN at half rate and continue soft diet as well as protein supplements. On fiber and iron supplement for diarrhea, imodium BID PRN. Will add antidiarrheals very slowly in this patient. Monitor electrolytes - WNL this am - consult to dietician and calorie count today - she does not like breeze supplement. Hopefully can d/c TPN soon - Continue mobilizing, PT/OT. Currently recommending SNF - patient agreeable and sounds like she has a plan for [facility] when medically ready for discharge  Acute  blood loss anemiain setting of chronic GI losses from cancer. H/h stable since restarting eliquis 5/30. Continue supplemental iron  FEN - soft diet, Boost TID, TPN 1/2 rate - dietician consult VTE -Eliquis ID -Cefotetanpre-op, PO vancomycin 5/24>>5/24, zosyn 5/26>>5/31, eraxis 5/26>>5/31, unasyn 5/31>> 6/1 (for PNA)  Atrial fibrillation- stable back on home meds toprol,cardizem, eliquis HTN HLD Diarrhea - c diffnegative5/23 Severe malnutrition - prealbumin 5.7 (5/27) >> 6.1 (5/30), advancing diet and weaning TPN Possible aspiration PNA - completed unasyn --per TRH--    LOS: 21 days    Winferd Humphrey, [facility] 11/17/2020, 9:05 AM Please see Amion for pager number during day hours 7:00am-4:30pm

## 2020-11-17 NOTE — Progress Notes (Addendum)
PHARMACY - TOTAL PARENTERAL NUTRITION CONSULT NOTE   Indication: Small bowel obstruction; anticipate prolonged NPO status  Patient Measurements: Height: _0  (162.6 cm) Weight: 64.5 kg (142 lb 1.6 oz) IBW/kg (Calculated) : 54.7 TPN AdjBW (KG): 65.4 Body mass index is 24.39 kg/m. Usual Weight: 66 kg (~10 lb weight loss this admission)  Assessment:  52 yoF with PMH Afib admitted 5/11 with anemia; found to have bleeding colonic mass suspicious for malignancy and underwent hemicolectomy 5/16. Developed ileus POD3 which initially improved, then worsened POD10. CT showed massive SBO and no immediate indication for surgery, so Pharmacy consulted to begin TPN for expected prolonged ileus.  Glucose / Insulin: no Hx DM; CBGs now within goal after decreasing TPN to 1/2 rate yesterday - 7 units SSI required yesterday Electrolytes: Slight refeeding initially, but now K, Phos, Mg WNL; Na, Cl, Ca stable WNL Renal: SCr, BUN, bicarb stable WNL; UOP not fully charted Hepatic: 5/30 Tbili improved to WNL; LFTs WNL except Alk Phos slightly elevated and albumin low/stable; TG stable WNL on 5/30 Prealbumin: low; minimally improved (5.7 >> 6.1) since starting TPN I/O: d/c NGT 5/29 - dCHF and diuresing since 5/29, no MIVF - still with diarrhea GI Imaging: - 5/13 colonoscopy: multiple polyps; larger masses at cecum and transverse colon - 5/14 MRI liver: no metastatic dz - 5/18 AXR: dilated SB loops; possible ileus - 5/26 CTa/p: severely dilated SB loops (not including ileocolonic anastomosis) concerning for distal SBO, also intramural and intrahepatic portal venous gas suggesting possible ischemic bowel - 5/31 mildly distended bowel loops: SBO vs ileus Surgeries / Procedures:  - 5/16 laparoscopic hemicolectomy/cecectomy; omentopexy of ileocolonic anastomosis  Central access: PICC placed 5/27 TPN start date: 5/27  Nutritional Goals RD recs 5/27 Kcal:  1700-1900 kcal Protein:  80-95 grams Fluid:  >/= 2  L/day  TPN at goal rate of 75 mL/hr provides 90 g of protein and 1818 kcals per day  Current Nutrition:  Soft diet TPN at 40 ml/hr  Plan:  KPhos 15 mmol IV x 1 (22.5 mEq K)  At 1800:  Continue TPN at 1/2 rate per Surgery  Electrolytes in TPN: no changes since yesterday  Na - 150 mEq/L  K - 70 mEq/L  Ca - 35mq/L  Mg - 52m/L  Phos - 25 mmol/L  Cl:Ac ratio 1:1  Add standard MVI and trace elements to TPN  D/c SSI as long as remains on 1/2 rate TPN; CBGs very stable at goal  MIVF per primary  Monitor TPN labs on Mon/Thurs  Labs stable today; will reorder in 48 hr  DrReuel BoomPharmD, BCPS [phone]/07/2020, 9:52 AM

## 2020-11-17 NOTE — NC FL2 (Signed)
[place] LEVEL OF CARE SCREENING TOOL     IDENTIFICATION  Patient Name: Robin [name] Birthdate: 07-02-1934 Sex: female Admission Date (Current Location): 10/26/2020  [facility] and [place] Number:  Herbalist and Address:  [facility],  [place], [place]      Provider Number: [id number]  Attending Physician Name and Address:  Lavina Hamman, MD  Relative Name and Phone Number:  Salena Saner (daughter) Ph: [phone]    Current Level of Care: Hospital Recommended Level of Care: [place] Prior Approval Number:    Date Approved/Denied:   PASRR Number: [id number]  Discharge Plan: SNF    Current Diagnoses: Patient Active Problem List   Diagnosis Date Noted  . Pressure injury of skin 11/15/2020  . Protein-calorie malnutrition, severe ([place]) 11/11/2020  . Hypophosphatemia 11/03/2020  . Cecal cancer & polyps s/p lap hemicolectomy 10/31/2020 10/31/2020  . High grade dysplasia in colonic adenomas transverse colon & splenic flexure 10/31/2020  . Colon cancer ([place]) 10/31/2020  . Colonic mass   . GI bleed 10/27/2020  . Anemia 10/26/2020  . Chronic anticoagulation 02/12/2019  . Venous insufficiency (chronic) (peripheral) 02/12/2019  . Normal coronary arteries 02/12/2019  . Carotid stenosis 03/02/2017  . Paroxysmal atrial fibrillation ([place]) 08/05/2015  . First degree heart block by electrocardiogram 06/03/2014  . Hypokalemia 03/02/2014  . PAC (premature atrial contraction) 05/05/2012  . Hypercholesterolemia 06/13/2011  . Benign hypertensive heart disease without heart failure 12/11/2010    Orientation RESPIRATION BLADDER Height & Weight     Self,Time,Situation,Place  O2 (2L/min) Continent Weight: 142 lb 1.6 oz (64.5 kg) Height:  5\' 4"  (162.6 cm)  BEHAVIORAL SYMPTOMS/MOOD NEUROLOGICAL BOWEL NUTRITION STATUS      Incontinent Diet (Soft diet; will be weaned from TPN by the time she is ready for  discharge.)  AMBULATORY STATUS COMMUNICATION OF NEEDS Skin   Limited Assist Verbally Other (Comment) (Ecchymosis: bilateral arms; patient has a small pressure injury on sacrum.)                       Personal Care Assistance Level of Assistance  Bathing,Feeding,Dressing Bathing Assistance: Limited assistance Feeding assistance: Independent Dressing Assistance: Limited assistance     Functional Limitations Info  Sight,Hearing,Speech Sight Info: Adequate Hearing Info: Adequate Speech Info: Adequate    SPECIAL CARE FACTORS FREQUENCY  PT (By licensed PT),OT (By licensed OT)     PT Frequency: 5x's/week OT Frequency: 5x's/week            Contractures Contractures Info: Not present    Additional Factors Info  Code Status,Allergies,Insulin Sliding Scale,Psychotropic Code Status Info: Full Allergies Info: Iodinated Diagnostic Agents, Amlodipine, Codeine, Diphenhydramine, Diphenhydramine Hcl, Pravastatin, Zetia (Ezetimibe), Sulfa Antibiotics, Sulfa Drugs Cross Reactors Psychotropic Info: Ativan Insulin Sliding Scale Info: See discharge summary       Current Medications (11/17/2020):  This is the current hospital active medication list Current Facility-Administered Medications  Medication Dose Route Frequency Provider Last Rate Last Admin  . acetaminophen (TYLENOL) suppository 650 mg  650 mg Rectal Q6H PRN [name], MD      . acetaminophen (TYLENOL) tablet 650 mg  650 mg Oral Q6H PRN [name], MD      . alum & mag hydroxide-simeth (MAALOX/MYLANTA) 200-200-20 MG/5ML suspension 30 mL  30 mL Oral Q6H PRN [name], MD      . apixaban (ELIQUIS) tablet 5 mg  5 mg Oral BID Darliss Cheney, MD   5  mg at 11/17/20 1012  . artificial tears (LACRILUBE) ophthalmic ointment   Both Eyes Q4H PRN Darliss Cheney, MD      . bisacodyl (DULCOLAX) suppository 10 mg  10 mg Rectal Daily PRN Meuth, Brooke A, PA-C      . Chlorhexidine Gluconate Cloth 2 % PADS 6 each  6 each Topical Daily  Darliss Cheney, MD   6 each at 11/17/20 1131  . diltiazem (CARDIZEM CD) 24 hr capsule 180 mg  180 mg Oral Daily Darliss Cheney, MD   180 mg at 11/17/20 1011  . [START ON 11/18/2020] famotidine (PEPCID) 40 MG/5ML suspension 20 mg  20 mg Oral Daily Lavina Hamman, MD      . feeding supplement (KATE FARMS STANDARD 1.4) liquid 325 mL  325 mL Oral BID BM Lavina Hamman, MD      . fentaNYL (SUBLIMAZE) injection 25-50 mcg  25-50 mcg Intravenous Q2H PRN [name], MD      . ferrous sulfate tablet 325 mg  325 mg Oral Q breakfast [name], MD   325 mg at 11/17/20 1011  . lip balm (CARMEX) ointment 1 application  1 application Topical BID [name], MD   1 application at 90/24/09 1012  . liver oil-zinc oxide (DESITIN) 40 % ointment   Topical BID Elodia Florence., MD   2 application at 73/53/29 1012  . loperamide (IMODIUM) capsule 2 mg  2 mg Oral BID PRN Meuth, Brooke A, PA-C   2 mg at 11/17/20 1012  . LORazepam (ATIVAN) injection 0.25 mg  0.25 mg Intravenous BID PRN [name], MD   0.25 mg at 11/16/20 2135  . magic mouthwash  15 mL Oral QID PRN [name], MD      . menthol-cetylpyridinium (CEPACOL) lozenge 3 mg  1 lozenge Oral PRN [name], MD      . methocarbamol (ROBAXIN) 1,000 mg in dextrose 5 % 100 mL IVPB  1,000 mg Intravenous Q8H PRN [name], MD      . Derrill Memo ON 11/18/2020] metoprolol succinate (TOPROL-XL) 24 hr tablet 100 mg  100 mg Oral Daily Lavina Hamman, MD      . metoprolol succinate (TOPROL-XL) 24 hr tablet 50 mg  50 mg Oral Q2200 Lavina Hamman, MD      . metoprolol tartrate (LOPRESSOR) injection 2.5 mg  2.5 mg Intravenous Q4H PRN [name], MD   2.5 mg at 11/10/20 0901  . ondansetron (ZOFRAN) tablet 4 mg  4 mg Oral Q6H PRN [name], MD       Or  . ondansetron [facility]) injection 4 mg  4 mg Intravenous Q6H PRN [name], MD   4 mg at 11/16/20 1234  . phenol (CHLORASEPTIC) mouth spray 2 spray  2 spray Mouth/Throat PRN [name], MD      .  polycarbophil (FIBERCON) tablet 625 mg  625 mg Oral BID [name], MD   625 mg at 11/17/20 1011  . potassium PHOSPHATE 15 mmol in dextrose 5 % 250 mL infusion  15 mmol Intravenous Once Polly Cobia, RPH 43 mL/hr at 11/17/20 1217 15 mmol at 11/17/20 1217  . prochlorperazine (COMPAZINE) tablet 10 mg  10 mg Oral Q6H PRN [name], MD       Or  . prochlorperazine (COMPAZINE) injection 5-10 mg  5-10 mg Intravenous Q6H PRN [name], MD   10 mg at 11/10/20 0854  . simethicone (MYLICON) 40 JM/4.2AS suspension 40 mg  40 mg Oral QID  Lavina Hamman, MD      . sodium chloride flush (NS) 0.9 % injection 10-40 mL  10-40 mL Intracatheter Q12H Darliss Cheney, MD   10 mL at 11/17/20 1012  . sodium chloride flush (NS) 0.9 % injection 10-40 mL  10-40 mL Intracatheter PRN Darliss Cheney, MD   10 mL at 11/17/20 0356  . TPN ADULT (ION)   Intravenous Continuous TPN Polly Cobia, RPH 40 mL/hr at 11/16/20 1718 New Bag at 11/16/20 1718  . TPN ADULT (ION)   Intravenous Continuous TPN [place], Cindie Laroche, RPH      . traMADol (ULTRAM) tablet 50-100 mg  50-100 mg Oral Q6H PRN [name], MD         Discharge Medications: Please see discharge summary for a list of discharge medications.  Relevant Imaging Results:  Relevant Lab Results:   Additional Information SSN: [id number]  Sherie Don, LCSW

## 2020-11-17 NOTE — Progress Notes (Signed)
Nutrition Follow-up  INTERVENTION:   -Calorie Count 6/2-6/3  [employer] 1.4 PO BID, each provides 455 kcals and 20g protein  -D/c Boost Breeze  -TPN management per Pharmacy  NUTRITION DIAGNOSIS:   Increased nutrient needs related to acute illness as evidenced by estimated needs.  Ongoing.  GOAL:   Patient will meet greater than or equal to 90% of their needs  Progressing.  MONITOR:   Diet advancement,Labs,Weight trends,I & O's,Other (Comment) (TPN regimen)  REASON FOR ASSESSMENT:   Consult Assessment of nutrition requirement/status,Calorie Count  ASSESSMENT:   86 y.o. female with medical history of A.Fib on Eliquis and HTN. She presented to the ED for evaluation of anemia and SOB which has been ongoing since 06/2020.  PO intakes so far today: B: 250 kcals, 4g protein  Patient states that her daughter is going to bring her some chicken and dumplings from [employer] for "supper". Pt reports not liking Boost Breeze as it is too sweet. She was advised not to drink Ensure by surgery d/t her loose stools currently. Pt is willing to try [employer] [place] which contains no dairy.   Patient continues TPN at half rate of 40 ml/hr (providing 969 kcals and 48g protein). Calorie count started by surgery to see if TPN can be weaned.  Admission weight:145 lbs. Current weight: 142 lbs.  Medications: Ferrous sulfate, K-Phos, Fibercon,  Labs reviewed:  CBGs: 118-148  Diet Order:   Diet Order            DIET SOFT Room service appropriate? Yes; Fluid consistency: Thin  Diet effective now                 EDUCATION NEEDS:   No education needs have been identified at this time  Skin:  Skin Assessment: Reviewed RN Assessment  Last BM:  5/27 (type 6 x1 and type 7 x2)  Height:   Ht Readings from Last 1 Encounters:  11/05/20 5\' 4"  (1.626 m)    Weight:   Wt Readings from Last 1 Encounters:  11/16/20 64.5 kg    BMI:  Body mass index is 24.39 kg/m.  Estimated  Nutritional Needs:   Kcal:  1700-1900 kcal  Protein:  80-95 grams  Fluid:  >/= 2 L/day  Clayton Bibles, MS, RD, LDN Inpatient Clinical Dietitian Contact information available via Amion

## 2020-11-17 NOTE — Progress Notes (Signed)
Occupational Therapy Treatment Patient Details Name: Robin [name] MRN: [id number] DOB: Dec 11, 1934 Today's Date: 11/17/2020    History of present illness 86 year old F presented to ED for anemia and shortness of breath, and admitted for blood loss anemia due to GI bleed.  Hgb 8.5 (reportedly 11 in January 2022 per daughter).  Hemoccult positive.  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 s/p laparoscopic hemicolectomy 10/31/20. Pt  with PMH of A. fib on Eliquis and HTN. Hospitilization complicated by ileus. Required reinsertion of NG tube 5/26.   OT comments  Treatment focused on improving activity tolerance via ambulation in room and exercises in chair. Patient educated to perform 10 reps of each exercise 3 x a day and handout provided. Patient ambulated 2 laps in room with RW and reported fatigue at end of task. Therapist moved [facility] to the other side of the bed and asked patient to walk to [place] (if BM not urgent) to promote increased activity. Patient verbalized understanding of instructions and requests. Patient seated in recliner at end of treatment performing skin care.   Follow Up Recommendations  SNF    Equipment Recommendations  None recommended by OT    Recommendations for Other Services      Precautions / Restrictions Precautions Precaution Comments: abdominal surgery Restrictions Weight Bearing Restrictions: No       Mobility Bed Mobility               General bed mobility comments: OOb in chair    Transfers Overall transfer level: Needs assistance Equipment used: Rolling walker (2 wheeled) Transfers: Sit to/from Stand;Stand Pivot Transfers Sit to Stand: Min guard Stand pivot transfers: Min guard       General transfer comment: Min guard for standing, cues for posture. Ambulated 2 laps in room (approx 40 feet) with RW. Min guard and no overt loss of balance today. Fatigued  quickly.    Balance Overall balance assessment: Mild deficits observed, not formally tested                                         ADL either performed or assessed with clinical judgement   ADL                                               Vision Patient Visual Report: No change from baseline     Perception     Praxis      Cognition Arousal/Alertness: Awake/alert Behavior During Therapy: WFL for tasks assessed/performed Overall Cognitive Status: Within Functional Limits for tasks assessed                                          Exercises Other Exercises Other Exercises: Overhead raises (shoulder flexion), knee extension and hip hikes x 10 each limb. Educated patient to perform 3 x a day.   Shoulder Instructions       General Comments      Pertinent Vitals/ Pain       Pain Assessment: Faces Faces Pain Scale: Hurts a little bit Pain Location: butt Pain Descriptors / Indicators: Grimacing;Sore Pain Intervention(s): Monitored during  session  Home Living                                          Prior Functioning/Environment              Frequency  Min 2X/week        Progress Toward Goals  OT Goals(current goals can now be found in the care plan section)  Progress towards OT goals: Progressing toward goals  Acute Rehab OT Goals Patient Stated Goal: to get stronger OT Goal Formulation: With patient Time For Goal Achievement: 11/28/20 Potential to Achieve Goals: Good  Plan Discharge plan remains appropriate    Co-evaluation                 AM-PAC OT "6 Clicks" Daily Activity     Outcome Measure   Help from another person eating meals?: A Little Help from another person taking care of personal grooming?: A Little Help from another person toileting, which includes using toliet, bedpan, or urinal?: A Lot Help from another person bathing (including washing, rinsing,  drying)?: A Lot Help from another person to put on and taking off regular upper body clothing?: A Little Help from another person to put on and taking off regular lower body clothing?: A Lot 6 Click Score: 15    End of Session Equipment Utilized During Treatment: Rolling walker  OT Visit Diagnosis: Pain;Other abnormalities of gait and mobility (R26.89);Unsteadiness on feet (R26.81)   Activity Tolerance Patient limited by fatigue   Patient Left in chair;with call bell/phone within reach   Nurse Communication Mobility status        Time: 0902-0928 OT Time Calculation (min): 26 min  Charges: OT General Charges $OT Visit: 1 Visit OT Treatments $Therapeutic Activity: 8-22 mins $Therapeutic Exercise: 8-22 mins  Derl Barrow, OTR/L [place]  Office [phone] Pager: [place] 11/17/2020, 9:38 AM

## 2020-11-17 NOTE — Progress Notes (Signed)
[facility] Progress Note  Patient: Robin [name]    BPZ:[id number]  DOA: 10/26/2020     Date of Service: the patient was seen and examined on 11/17/2020  Brief hospital course: Past medical history of paroxysmal A. fib on Eliquis, HTN.  Presents with complaints of anemia found to have colonic neoplasm.  Underwent laparoscopic hemicolectomy and reanastomosis on 5/16.  Postop developed ileus requiring prolonged TPN. Developed fever on 5/26 concerning for pneumatosis based on the work-up and started on broad-spectrum IV antibiotics. Currently plan is monitor for improvement.  Assessment and Plan: 1.  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  Biopsy was positive for invasive adenocarcinoma. SP laparoscopic proximal hemicolectomy with reanastomosis by Dr. Elinor Parkinson on 5/16. Surgical pathology with negative margins. Patient will require outpatient oncology follow-up. Management per surgery.  2.  Postop ileus Now frequent diarrhea Developed prolonged postop ileus requiring NG tube insertion as well as TPN. Currently ileus is improving Although still has significant poor p.o. intake.  Calorie count is on its way. Patient does not have large-volume diarrhea just severe without any blood. No abdominal pain but still has some distention. Add a probiotic.  Discontinue Protonix and change to Pepcid.  Add simethicone. TPN reduced on 6/1.  3.  Concern for pneumatosis Aspiration pneumonia 5/26 developed fever with tachycardia and leukocytosis. CT abdomen concerning for small bowel dilation and gastric distention as well as pneumatosis along the duodenum and portal venous gas. NG tube replaced patient was started on IV Zosyn antibiotics this. No further antibiotic needed per surgery. Completed course for aspiration pneumonia.  4.  Acute on chronic GI bleeding. Iron deficiency anemia FOBT was positive. Hemoglobin stable  for now. Continue replacing iron and B12.  5.  Paroxysmal A. fib with RVR Rate currently controlled. On Toprol-XL, changing from twice daily to daily to minimize pill burden. Continue Cardizem and Eliquis. Monitor.  6.  Essential hypertension Blood pressure stable. Continue current regimen.  7.  Acute on chronic diastolic CHF Patient appears to have volume overload postoperatively likely due to need for the TPN pending Intermittently requiring Lasix. Echocardiogram shows preserved EF without any wall motion abnormality. Monitor.  8.  Hypokalemia, hypophosphatemia, hypomagnesemia. Currently being replaced.  Monitor.  Body mass index is 24.39 kg/m.  Nutrition Problem: Increased nutrient needs Etiology: acute illness Interventions: Interventions: TPN  9.  Stage II right buttock pressure ulcer. POA status not clear. Continue home dressing. Pressure Injury 11/14/20 Buttocks Right Stage 2 -  Partial thickness loss of dermis presenting as a shallow open injury with a red, pink wound bed without slough. (Active)  11/14/20 2230  Location: Buttocks  Location Orientation: Right  Staging: Stage 2 -  Partial thickness loss of dermis presenting as a shallow open injury with a red, pink wound bed without slough.  Wound Description (Comments):   Present on Admission:     Diet: Regular diet DVT Prophylaxis:   SCDs Start: 10/26/20 2337 apixaban (ELIQUIS) tablet 5 mg    Advance goals of care discussion: Full code  Family Communication: no family was present at bedside, at the time of interview.   Disposition:  Status is: Inpatient  Remains inpatient appropriate because:IV treatments appropriate due to intensity of illness or inability to take PO  Dispo: The patient is from: Home              Anticipated d/c is to: SNF  Patient currently is not medically stable to d/c.   Difficult to place patient No  Subjective: No nausea no vomiting.  Oral intake is about the  same.  Nursing reported frequent small-volume loose bowel movement.  Imodium is helping per patient.  Passing gas.  Physical Exam:  General: Appear in mild distress, no Rash; Oral Mucosa Clear, moist. no Abnormal Neck Mass Or lumps, Conjunctiva normal  Cardiovascular: S1 and S2 Present, no Murmur, Respiratory: good respiratory effort, Bilateral Air entry present and CTA, no Crackles, no wheezes Abdomen: Bowel Sound present, Soft and distended, no tenderness Extremities: no Pedal edema Neurology: alert and oriented to time, place, and person affect appropriate. no new focal deficit Gait not checked due to patient safety concerns   Vitals:   11/16/20 2012 11/17/20 0232 11/17/20 0643 11/17/20 1344  BP: (!) 130/55 (!) 146/58 137/63 (!) 144/62  Pulse: (!) 59 78 91 86  Resp: 18 16 18 19   Temp: 98 F (36.7 C) 98.4 F (36.9 C) (!) 97.4 F (36.3 C) 98 F (36.7 C)  TempSrc: Oral Oral Oral Oral  SpO2: 98% 96% 95% 96%  Weight:      Height:        Intake/Output Summary (Last 24 hours) at 11/17/2020 1515 Last data filed at 11/17/2020 1500 Gross per 24 hour  Intake 2461.99 ml  Output 700 ml  Net 1761.99 ml   Filed Weights   11/14/20 0500 11/15/20 0500 11/16/20 0609  Weight: 64.3 kg 61.3 kg 64.5 kg    Data Reviewed: I have personally reviewed and interpreted daily labs, tele strips, imaging. I reviewed all nursing notes, pharmacy notes, vitals, pertinent old records I have discussed plan of care as described above with RN and patient/family.  CBC: Recent Labs  Lab 11/11/20 0529 11/12/20 0710 11/13/20 0300 11/14/20 0355 11/16/20 0238 11/16/20 1320 11/17/20 0312  WBC 17.0* 13.1* 11.8* 10.8*  --  12.0* 9.4  NEUTROABS 15.7*  --   --  8.4*  --   --   --   HGB 11.4* 9.4* 9.2* 9.3* 9.1* 9.8* 9.0*  HCT 37.8 31.6* 30.2* 31.0*  --  33.6* 29.8*  MCV 79.4* 80.2 80.3 80.5  --  79.8* 79.9*  PLT 441* 424* 405* 428*  --  434* 517   Basic Metabolic Panel: Recent Labs  Lab 11/12/20 0710  11/13/20 0300 11/14/20 0355 11/15/20 0417 11/16/20 0910 11/17/20 0312  NA 137 136 136 133* 134* 135  K 3.4* 3.5 3.4* 3.5 4.0 3.9  CL 104 106 103 101 100 103  CO2 27 25 26 28 27 28   GLUCOSE 112* 135* 156* 148* 157* 126*  BUN 28* 19 16 19 20 22   CREATININE 0.45 0.45 0.54 0.46 0.46 0.47  CALCIUM 8.6* 8.3* 8.6* 8.5* 8.7* 8.9  MG 2.1 2.0 1.9 2.0  --  2.0  PHOS 1.2* 2.3* 2.1* 2.2* 2.5 2.7    Studies: No results found.  Scheduled Meds: . apixaban  5 mg Oral BID  . Chlorhexidine Gluconate Cloth  6 each Topical Daily  . diltiazem  180 mg Oral Daily  . [START ON 11/18/2020] famotidine  20 mg Oral Daily  . feeding supplement (KATE [employer]ARMS STANDARD 1.4)  325 mL Oral BID BM  . ferrous sulfate  325 mg Oral Q breakfast  . lactobacillus  1 g Oral TID WC  . lip balm  1 application Topical BID  . liver oil-zinc oxide   Topical BID  . [START ON 11/18/2020] metoprolol succinate  100  mg Oral Daily  . metoprolol succinate  50 mg Oral Q2200  . polycarbophil  625 mg Oral BID  . simethicone  40 mg Oral QID  . sodium chloride flush  10-40 mL Intracatheter Q12H   Continuous Infusions: . methocarbamol (ROBAXIN) IV    . potassium PHOSPHATE IVPB (in mmol) 15 mmol (11/17/20 1217)  . TPN ADULT (ION) 40 mL/hr at 11/16/20 1718  . TPN ADULT (ION)     PRN Meds: acetaminophen, acetaminophen, alum & mag hydroxide-simeth, artificial tears, bisacodyl, fentaNYL (SUBLIMAZE) injection, loperamide, LORazepam, magic mouthwash, menthol-cetylpyridinium, methocarbamol (ROBAXIN) IV, metoprolol tartrate, ondansetron **OR** ondansetron (ZOFRAN) IV, phenol, prochlorperazine **OR** prochlorperazine, sodium chloride flush, traMADol  Time spent: 35 minutes  Author: Berle Mull, MD [facility] 11/17/2020 3:15 PM  To reach On-call, see care teams to locate the attending and reach out via [email or web address]. Between 7PM-7AM, please contact night-coverage If you still have difficulty reaching the attending provider, please page  the [facility] (Director on Call) for [facility] on amion for assistance.

## 2020-11-18 ENCOUNTER — Inpatient Hospital Stay (HOSPITAL_COMMUNITY): Payer: Medicare PPO

## 2020-11-18 DIAGNOSIS — C180 Malignant neoplasm of cecum: Secondary | ICD-10-CM | POA: Diagnosis not present

## 2020-11-18 LAB — BASIC METABOLIC PANEL
Anion gap: 7 (ref 5–15)
BUN: 19 mg/dL (ref 8–23)
CO2: 27 mmol/L (ref 22–32)
Calcium: 9.4 mg/dL (ref 8.9–10.3)
Chloride: 103 mmol/L (ref 98–111)
Creatinine, Ser: 0.55 mg/dL (ref 0.44–1.00)
GFR, Estimated: >60 mL/min (ref >60)
Glucose, Bld: 152 mg/dL — ABNORMAL HIGH (ref 70–99)
Potassium: 4 mmol/L (ref 3.5–5.1)
Sodium: 137 mmol/L (ref 135–145)

## 2020-11-18 LAB — CBC
HCT: 34.1 % — ABNORMAL LOW (ref 36.0–46.0)
Hemoglobin: 10.1 g/dL — ABNORMAL LOW (ref 12.0–15.0)
MCH: 24.3 pg — ABNORMAL LOW (ref 26.0–34.0)
MCHC: 29.6 g/dL — ABNORMAL LOW (ref 30.0–36.0)
MCV: 82.2 fL (ref 80.0–100.0)
Platelets: 447 10*3/uL — ABNORMAL HIGH (ref 150–400)
RBC: 4.15 MIL/uL (ref 3.87–5.11)
RDW: 25.5 % — ABNORMAL HIGH (ref 11.5–15.5)
WBC: 10.9 10*3/uL — ABNORMAL HIGH (ref 4.0–10.5)
nRBC: 0 % (ref 0.0–0.2)

## 2020-11-18 LAB — HEMOGLOBIN: Hemoglobin: 9.6 g/dL — ABNORMAL LOW (ref 12.0–15.0)

## 2020-11-18 MED ORDER — TRAVASOL 10 % IV SOLN
INTRAVENOUS | Status: AC
  Filled 2020-11-18: qty 480

## 2020-11-18 MED ORDER — BACID PO TABS: 2.0000 | ORAL_TABLET | Freq: Three times a day (TID) | ORAL | Status: DC

## 2020-11-18 MED ORDER — BOOST / RESOURCE BREEZE PO LIQD CUSTOM
1.0000 | Freq: Three times a day (TID) | ORAL | Status: DC
  Administered 2020-11-18 – 2020-11-24 (×7): 1 via ORAL

## 2020-11-18 MED ORDER — LACTINEX PO CHEW
1.0000 | CHEWABLE_TABLET | Freq: Three times a day (TID) | ORAL | Status: DC
  Administered 2020-11-19 – 2020-11-29 (×26): 1 via ORAL
  Filled 2020-11-18 (×36): qty 1

## 2020-11-18 MED ORDER — SACCHAROMYCES BOULARDII 250 MG PO CAPS: 250.0000 mg | ORAL_CAPSULE | Freq: Two times a day (BID) | ORAL | Status: DC

## 2020-11-18 NOTE — Progress Notes (Signed)
Calorie Count Note  48 hour calorie count ordered.  Diet: Soft, thin liquids Supplements: Boost Breeze TID  Breakfast (6/2): 40% (232 kcal and 11 grams protein) Lunch (6/2): bites of chicken and dumplings from [employer]  Dinner (6/2): 0% Supplements: not yet provided since order change   Nutrition Dx:  Increased nutrient needs related to acute illness as evidenced by estimated needs.  Goal:  Patient will meet greater than or equal to 90% of their needs  Intervention:  - continue to encourage PO intakes of meals - continue Boost Breeze TID, each supplement provides 250 kcal and 9 grams of protein. - will trial Magic Cup BID with meals, each supplement provides 290 kcal and 9 grams of protein. - will follow-up on 6/6 for Calorie Count   Patient sitting in the chair at the time of visit. She had a few bites of an english muffin for breakfast this AM but reports that the more she chewed the larger the muffin seemed to get making it difficult to swallow. She reports poor appetite and that she has increased abdominal distention which is negatively impacting appetite.   Patient has been working with PT and plans to walk in the hallway this afternoon. She has been having BMs, reports "too often" and that they are diarrhea.   Patient tried [employer] oral nutrition supplement yesterday and did not like the taste or consistency and requests to no longer receive this supplement. RN, who was at bedside, reported that order had already been canceled and switched back to [facility]. Patient had previously reported that this supplement is too sweet.   Her daughter visits daily. Encouraged her to have daughter bring in any foods or beverages that appeal to her and that she knows she tolerates well. Patient reports plan for repeat abdominal xray today and that she will think about what she would like her daughter to bring for her.      Jarome Matin, MS, RD, LDN, CNSC Inpatient Clinical  Dietitian RD pager # available in [place]  After hours/weekend pager # available in [facility]

## 2020-11-18 NOTE — Progress Notes (Signed)
Progress Note  18 Days Post-Op  Subjective: CC: Feeling more bloated this am. She had nausea and emesis x2 overnight. At least BM x3 yesterday and 2 this am. She thinks they are becoming more formed. She still has very low appetite. Pain from right buttock sore is most bothersome  Objective: Vital signs in last 24 hours: Temp:  [97.8 F (36.6 C)-98 F (36.7 C)] 97.8 F (36.6 C) (06/03 0642) Pulse Rate:  [86-96] 89 (06/03 0642) Resp:  [18-19] 18 (06/03 0642) BP: (136-152)/(62-83) 136/83 (06/03 0642) SpO2:  [96 %-98 %] 97 % (06/03 0642) Weight:  [64.4 kg] 64.4 kg (06/03 0642) Last BM Date: 11/17/20  Intake/Output from previous day: 06/02 0701 - 06/03 0700 In: 2692.5 [P.O.:1560; I.V.:915.5; IV Piggyback:217] Out: 1850 [Urine:1850] Intake/Output this shift: Total I/O In: 120 [P.O.:120] Out: -   PE: Gen:  Alert, NAD, pleasant. Sitting up in chair Pulm: rate and effort normal on room air Abd: Soft, moderate distension, nontender, +BS, incisions cdi Skin: no rashes noted, warm and dry   Lab Results:  Recent Labs    11/16/20 1320 11/17/20 0312 11/18/20 0239  WBC 12.0* 9.4  --   HGB 9.8* 9.0* 9.6*  HCT 33.6* 29.8*  --   PLT 434* 389  --    BMET Recent Labs    11/16/20 0910 11/17/20 0312  NA 134* 135  K 4.0 3.9  CL 100 103  CO2 27 28  GLUCOSE 157* 126*  BUN 20 22  CREATININE 0.46 0.47  CALCIUM 8.7* 8.9   PT/INR No results for input(s): LABPROT, INR in the last 72 hours. CMP     Component Value Date/Time   NA 135 11/17/2020 0312   NA 139 02/21/2018 0854   K 3.9 11/17/2020 0312   CL 103 11/17/2020 0312   CO2 28 11/17/2020 0312   GLUCOSE 126 (H) 11/17/2020 0312   BUN 22 11/17/2020 0312   BUN 20 02/21/2018 0854   CREATININE 0.47 11/17/2020 0312   CREATININE 0.54 (L) 11/28/2015 1422   CALCIUM 8.9 11/17/2020 0312   PROT 5.0 (L) 11/17/2020 0312   ALBUMIN 2.1 (L) 11/17/2020 0312   AST 21 11/17/2020 0312   ALT 20 11/17/2020 0312   ALKPHOS 138 (H)  11/17/2020 0312   BILITOT 0.5 11/17/2020 0312   GFRNONAA >60 11/17/2020 0312   GFRAA 78 02/21/2018 0854   Lipase     Component Value Date/Time   LIPASE 31 10/26/2020 1919       Studies/Results: DG Abd Portable 1V  Result Date: 11/16/2020 CLINICAL DATA:  Cecal cancer with hemicolectomy 10/31/2020.  Nausea. EXAM: PORTABLE ABDOMEN - 1 VIEW COMPARISON:  11/11/2020 FINDINGS: NG tube removed. Mildly distended bowel loops predominately small bowel. Upright view not obtained evaluate for free air Surgical clips in the gallbladder fossa. Lumbar scoliosis and disc degeneration. Mild atherosclerotic aorta. IMPRESSION: Mildly distended small bowel loops may represent small-bowel obstruction or ileus. Electronically Signed   By: Franchot Gallo M.D.   On: 11/16/2020 15:16    Anti-infectives: Anti-infectives (From admission, onward)   Start     Dose/Rate Route Frequency Ordered Stop   11/16/20 1200  Ampicillin-Sulbactam (UNASYN) 3 g in sodium chloride 0.9 % 100 mL IVPB        3 g 200 mL/hr over 30 Minutes Intravenous Every 6 hours 11/16/20 0755 11/17/20 0840   11/15/20 1200  Ampicillin-Sulbactam (UNASYN) 3 g in sodium chloride 0.9 % 100 mL IVPB  Status:  Discontinued  3 g 200 mL/hr over 30 Minutes Intravenous Every 6 hours 11/15/20 1055 11/16/20 0755   11/11/20 1800  anidulafungin (ERAXIS) 100 mg in sodium chloride 0.9 % 100 mL IVPB  Status:  Discontinued       "Followed by" Linked Group Details   100 mg 78 mL/hr over 100 Minutes Intravenous Every 24 hours 11/10/20 1622 11/15/20 0815   11/10/20 2200  piperacillin-tazobactam (ZOSYN) IVPB 3.375 g  Status:  Discontinued        3.375 g 12.5 mL/hr over 240 Minutes Intravenous Every 8 hours 11/10/20 1610 11/15/20 1035   11/10/20 1800  anidulafungin (ERAXIS) 200 mg in sodium chloride 0.9 % 200 mL IVPB       "Followed by" Linked Group Details   200 mg 78 mL/hr over 200 Minutes Intravenous  Once 11/10/20 1622 11/11/20 0016   11/10/20 1700   anidulafungin (ERAXIS) 100 mg in sodium chloride 0.9 % 100 mL IVPB  Status:  Discontinued        100 mg 78 mL/hr over 100 Minutes Intravenous Every 24 hours 11/10/20 1610 11/10/20 1621   11/10/20 1700  piperacillin-tazobactam (ZOSYN) IVPB 3.375 g        3.375 g 100 mL/hr over 30 Minutes Intravenous  Once 11/10/20 1618 11/10/20 1720   11/08/20 0045  vancomycin (VANCOCIN) 50 mg/mL oral solution 125 mg  Status:  Discontinued        125 mg Oral Every 6 hours 11/07/20 2354 11/08/20 1304   10/31/20 2200  cefoTEtan (CEFOTAN) 2 g in sodium chloride 0.9 % 100 mL IVPB        2 g 200 mL/hr over 30 Minutes Intravenous Every 12 hours 10/31/20 1744 10/31/20 2205   10/31/20 0915  cefoTEtan (CEFOTAN) 2 g in sodium chloride 0.9 % 100 mL IVPB        2 g 200 mL/hr over 30 Minutes Intravenous On call to O.R. 10/31/20 0824 10/31/20 1312       Assessment/Plan  POD#18-s/p lap assisted right hemicolectomy for ascending colon mass (adenocarcinoma) and multiple polyps (likely pre-malignant), Dr. Johney Maine 10/31/20 - Continue TPN at half rate and continue soft diet as well as protein supplements. On fiber and iron supplement for diarrhea, imodium BID PRN. Will add antidiarrheals very slowly in this patient. - worsened distension, nausea/emesis - abd xray pending, WBC and BMP pending - consult to dietician and continue calorie count today - Hopefully can d/c TPN soon - Continue mobilizing, PT/OT. Currently recommending SNF - patient agreeable and sounds like she has a plan for [facility] when medically ready for discharge  Acute blood loss anemiain setting of chronic GI losses from cancer. H/h stable since restarting eliquis 5/30. Continue supplemental iron  FEN - soft diet, TPN 1/2 rate - dietician consult VTE -Eliquis ID -Cefotetanpre-op, PO vancomycin 5/24>>5/24, zosyn 5/26>>5/31, eraxis 5/26>>5/31, unasyn 5/31>> 6/1 (for PNA)  Atrial fibrillation- stable back on home meds toprol,cardizem,  eliquis HTN HLD Diarrhea - c diffnegative5/23 Severe malnutrition - prealbumin 5.7 (5/27) >> 6.1 (5/30), advancing diet and weaning TPN Possible aspiration PNA - completed unasyn --per TRH--    LOS: 22 days    Winferd Humphrey, [facility] 11/18/2020, 8:59 AM Please see Amion for pager number during day hours 7:00am-4:30pm

## 2020-11-18 NOTE — Progress Notes (Signed)
Physical Therapy Treatment Patient Details Name: Robin [name] MRN: [id number] DOB: 09-23-1934 Today's Date: 11/18/2020    History of Present Illness 86 year old F presented to ED for anemia and shortness of breath, and admitted for blood loss anemia due to GI bleed.  Hgb 8.5 (reportedly 11 in January 2022 per daughter).  Hemoccult positive.  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 s/p laparoscopic hemicolectomy 10/31/20. Pt  with PMH of A. fib on Eliquis and HTN. Hospitilization complicated by ileus. Required reinsertion of NG tube 5/26.    PT Comments    Pt assisted with ambulating in hallway and able to improve distance today however reports increased fatigue and generalized weakness with any activity.  Pt returned to sitting EOB and performed exercises prior to return to supine.     Follow Up Recommendations  SNF     Equipment Recommendations  3in1 (PT)    Recommendations for Other Services       Precautions / Restrictions Precautions Precautions: Other (comment) Precaution Comments: abdominal surgery    Mobility  Bed Mobility Overal bed mobility: Needs Assistance Bed Mobility: Sit to Supine       Sit to supine: Min guard;HOB elevated        Transfers Overall transfer level: Needs assistance Equipment used: Rolling walker (2 wheeled) Transfers: Sit to/from Stand Sit to Stand: Min assist         General transfer comment: slight assist to rise  Ambulation/Gait Ambulation/Gait assistance: Min guard Gait Distance (Feet): 200 Feet Assistive device: Rolling walker (2 wheeled) Gait Pattern/deviations: Step-through pattern;Decreased stride length Gait velocity: decreased   General Gait Details: only one cue for posture and pt able to maintain today   Stairs             Wheelchair Mobility    Modified Rankin (Stroke Patients Only)       Balance                                             Cognition Arousal/Alertness: Awake/alert Behavior During Therapy: WFL for tasks assessed/performed Overall Cognitive Status: Within Functional Limits for tasks assessed                                        Exercises General Exercises - Upper Extremity Shoulder Flexion: AROM;Both;5 reps;Seated General Exercises - Lower Extremity Ankle Circles/Pumps: AROM;Both;10 reps;Seated Long Arc Quad: AROM;Both;10 reps;Seated Hip Flexion/Marching: AROM;Both;Seated;10 reps Other Exercises Other Exercises: scapular retraction in sitting x5    General Comments        Pertinent Vitals/Pain Pain Assessment: No/denies pain Pain Intervention(s): Monitored during session;Repositioned    Home Living                      Prior Function            PT Goals (current goals can now be found in the care plan section) Acute Rehab PT Goals PT Goal Formulation: With patient Time For Goal Achievement: 12/02/20 Potential to Achieve Goals: Good Progress towards PT goals: Progressing toward goals    Frequency    Min 3X/week      PT Plan Current plan remains appropriate    Co-evaluation  AM-PAC PT "6 Clicks" Mobility   Outcome Measure  Help needed turning from your back to your side while in a flat bed without using bedrails?: A Little Help needed moving from lying on your back to sitting on the side of a flat bed without using bedrails?: A Little Help needed moving to and from a bed to a chair (including a wheelchair)?: A Little Help needed standing up from a chair using your arms (e.g., wheelchair or bedside chair)?: A Little Help needed to walk in hospital room?: A Little Help needed climbing 3-5 steps with a railing? : A Lot 6 Click Score: 17    End of Session   Activity Tolerance: Patient tolerated treatment well Patient left: in bed;with call bell/phone within reach;with bed alarm  set Nurse Communication: Mobility status PT Visit Diagnosis: Difficulty in walking, not elsewhere classified (R26.2)     Time: 1610-9604 PT Time Calculation (min) (ACUTE ONLY): 18 min  Charges:  $Gait Training: 8-22 mins                     Arlyce Dice, DPT Acute Rehabilitation Services Pager: [phone] Office: [phone]   York Ram E 11/18/2020, 3:48 PM

## 2020-11-18 NOTE — Progress Notes (Signed)
PHARMACY - TOTAL PARENTERAL NUTRITION CONSULT NOTE   Indication: Small bowel obstruction; anticipate prolonged NPO status  Patient Measurements: Height: _0  (162.6 cm) Weight: 64.4 kg (141 lb 15.6 oz) IBW/kg (Calculated) : 54.7 TPN AdjBW (KG): 65.4 Body mass index is 24.37 kg/m. Usual Weight: 66 kg (~10 lb weight loss this admission)  Assessment:  24 yoF with PMH Afib admitted 5/11 with anemia; found to have bleeding colonic mass suspicious for malignancy and underwent hemicolectomy 5/16. Developed ileus POD3 which initially improved, then worsened POD10. CT showed massive SBO and no immediate indication for surgery, so Pharmacy consulted to begin TPN for expected prolonged ileus.  Glucose / Insulin: no Hx DM; CBGs within goal after decreasing TPN to 1/2 rate yesterday Electrolytes: all WNL Renal: SCr, BUN, bicarb stable WNL; UOP not fully charted Hepatic: 5/30 Tbili improved to WNL; LFTs WNL except Alk Phos slightly elevated and albumin low/stable; TG stable WNL on 5/30 Prealbumin: low; minimally improved (5.7 >> 6.1) since starting TPN I/O: d/c NGT 5/29 - dCHF and diuresing since 5/29, no MIVF - still with diarrhea but CCS note, BMs appear to be becoming more formed GI Imaging: - 5/13 colonoscopy: multiple polyps; larger masses at cecum and transverse colon - 5/14 MRI liver: no metastatic dz - 5/18 AXR: dilated SB loops; possible ileus - 5/26 CTa/p: severely dilated SB loops (not including ileocolonic anastomosis) concerning for distal SBO, also intramural and intrahepatic portal venous gas suggesting possible ischemic bowel - 5/31 mildly distended bowel loops: SBO vs ileus Surgeries / Procedures:  - 5/16 laparoscopic hemicolectomy/cecectomy; omentopexy of ileocolonic anastomosis  Central access: PICC placed 5/27 TPN start date: 5/27  Nutritional Goals RD recs 5/27 Kcal:  1700-1900 kcal Protein:  80-95 grams Fluid:  >/= 2 L/day  TPN at goal rate of 75 mL/hr provides 90 g  of protein and 1818 kcals per day  Current Nutrition:  Soft diet - ongoing calorie count per dietary TPN at 40 ml/hr - per 6/3 CCS note, continue 1/2 rate TPN and soft diet with protein supplements  Plan: At 1800:  Continue TPN at 1/2 rate per Surgery  Electrolytes in TPN: no changes since yesterday  Na - 150 mEq/L  K - 70 mEq/L  Ca - 9mq/L  Mg - 526m/L  Phos - 25 mmol/L  Cl:Ac ratio 1:1  Add standard MVI and trace elements to TPN  D/C SSI as long as remains on 1/2 rate TPN; CBGs very stable at goal  MIVF per primary  Monitor TPN labs on Mon/Thurs  Labs stable today; will reorder in 48 hr   JuAdrian SaranPharmD, BCPS Secure Chat if ?s 11/18/2020 10:29 AM

## 2020-11-18 NOTE — Progress Notes (Signed)
[facility] Progress Note  Patient: Robin [name]    WCH:[id number]  DOA: 10/26/2020     Date of Service: the patient was seen and examined on 11/18/2020  Brief hospital course: Past medical history of paroxysmal A. fib on Eliquis, HTN.  Presents with complaints of anemia found to have colonic neoplasm.  Underwent laparoscopic hemicolectomy and reanastomosis on 5/16.  Postop developed ileus requiring prolonged TPN. Developed fever on 5/26 concerning for pneumatosis based on the work-up and started on broad-spectrum IV antibiotics. Currently plan is monitor for improvement.  Assessment and Plan: 1.  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  Biopsy was positive for invasive adenocarcinoma. SP laparoscopic proximal hemicolectomy with reanastomosis by Dr. Elinor Parkinson on 5/16. Surgical pathology with negative margins. Patient will require outpatient oncology follow-up. Management per surgery.  2.  Postop ileus Now frequent diarrhea Developed prolonged postop ileus requiring NG tube insertion as well as TPN. Currently ileus is improving Although still has significant poor p.o. intake.  Calorie count is on its way. Patient does not have large-volume diarrhea just severe without any blood. No abdominal pain but still has some distention. Add a probiotic.  Discontinue Protonix and change to Pepcid.  Add simethicone. TPN reduced on 6/1. Repeat x-ray on 6/3 continues to show some distention likely from ileus.  Monitor.  3.  Concern for pneumatosis Aspiration pneumonia 5/26 developed fever with tachycardia and leukocytosis. CT abdomen concerning for small bowel dilation and gastric distention as well as pneumatosis along the duodenum and portal venous gas. NG tube replaced patient was started on IV Zosyn antibiotics this. No further antibiotic needed per surgery. Completed course for aspiration pneumonia.  4.  Acute on  chronic GI bleeding. Iron deficiency anemia FOBT was positive. Hemoglobin stable for now. Continue replacing iron and B12.  5.  Paroxysmal A. fib with RVR Rate currently controlled. On Toprol-XL, changing from twice daily to daily to minimize pill burden. Continue Cardizem and Eliquis. Monitor.  6.  Essential hypertension Blood pressure stable. Continue current regimen.  7.  Acute on chronic diastolic CHF Patient appears to have volume overload postoperatively likely due to need for the TPN pending Intermittently requiring Lasix. Echocardiogram shows preserved EF without any wall motion abnormality. Monitor.  8.  Hypokalemia, hypophosphatemia, hypomagnesemia. Currently being replaced.  Monitor.  Body mass index is 24.37 kg/m.  Nutrition Problem: Increased nutrient needs Etiology: acute illness Interventions: Interventions: TPN  9.  Stage II right buttock pressure ulcer. POA status not clear. Continue home dressing. Pressure Injury 11/14/20 Buttocks Right Stage 2 -  Partial thickness loss of dermis presenting as a shallow open injury with a red, pink wound bed without slough. (Active)  11/14/20 2230  Location: Buttocks  Location Orientation: Right  Staging: Stage 2 -  Partial thickness loss of dermis presenting as a shallow open injury with a red, pink wound bed without slough.  Wound Description (Comments):   Present on Admission:     Diet: Regular diet DVT Prophylaxis:   SCDs Start: 10/26/20 2337 apixaban (ELIQUIS) tablet 5 mg    Advance goals of care discussion: Full code  Family Communication: no family was present at bedside, at the time of interview.  Discussed with daughter on the phone.  Disposition:  Status is: Inpatient  Remains inpatient appropriate because:IV treatments appropriate due to intensity of illness or inability to take PO  Dispo: The patient is from: Home  Anticipated d/c is to: SNF              Patient currently is not  medically stable to d/c.   Difficult to place patient No  Subjective: No nausea or vomiting.  Oral intake still minimal but gradually improving.  Passing gas and having bowel movements.  Abdomen still distended.  Physical Exam:  General: Appear in mild distress, no Rash; Oral Mucosa Clear, moist. no Abnormal Neck Mass Or lumps, Conjunctiva normal  Cardiovascular: S1 and S2 Present, no Murmur, Respiratory: good respiratory effort, Bilateral Air entry present and CTA, no Crackles, no wheezes Abdomen: Bowel Sound present, Soft and distended, no tenderness Extremities: no Pedal edema Neurology: alert and oriented to time, place, and person affect appropriate. no new focal deficit Gait not checked due to patient safety concerns  Vitals:   11/17/20 1344 11/17/20 2057 11/18/20 0642 11/18/20 1400  BP: (!) 144/62 (!) 152/68 136/83 (!) 176/70  Pulse: 86 96 89 74  Resp: 19 18 18 18   Temp: 98 F (36.7 C) 97.9 F (36.6 C) 97.8 F (36.6 C) 98.5 F (36.9 C)  TempSrc: Oral Oral Oral Oral  SpO2: 96% 98% 97% 98%  Weight:   64.4 kg   Height:        Intake/Output Summary (Last 24 hours) at 11/18/2020 1910 Last data filed at 11/18/2020 1712 Gross per 24 hour  Intake 1787.9 ml  Output 1200 ml  Net 587.9 ml   Filed Weights   11/15/20 0500 11/16/20 0609 11/18/20 0642  Weight: 61.3 kg 64.5 kg 64.4 kg    Data Reviewed: I have personally reviewed and interpreted daily labs, tele strips, imaging. I reviewed all nursing notes, pharmacy notes, vitals, pertinent old records I have discussed plan of care as described above with RN and patient/family.  CBC: Recent Labs  Lab 11/13/20 0300 11/14/20 0355 11/16/20 0238 11/16/20 1320 11/17/20 0312 11/18/20 0239 11/18/20 1151  WBC 11.8* 10.8*  --  12.0* 9.4  --  10.9*  NEUTROABS  --  8.4*  --   --   --   --   --   HGB 9.2* 9.3* 9.1* 9.8* 9.0* 9.6* 10.1*  HCT 30.2* 31.0*  --  33.6* 29.8*  --  34.1*  MCV 80.3 80.5  --  79.8* 79.9*  --  82.2  PLT  405* 428*  --  434* 389  --  786*   Basic Metabolic Panel: Recent Labs  Lab 11/12/20 0710 11/13/20 0300 11/14/20 0355 11/15/20 0417 11/16/20 0910 11/17/20 0312 11/18/20 1350  NA 137 136 136 133* 134* 135 137  K 3.4* 3.5 3.4* 3.5 4.0 3.9 4.0  CL 104 106 103 101 100 103 103  CO2 27 25 26 28 27 28 27   GLUCOSE 112* 135* 156* 148* 157* 126* 152*  BUN 28* 19 16 19 20 22 19   CREATININE 0.45 0.45 0.54 0.46 0.46 0.47 0.55  CALCIUM 8.6* 8.3* 8.6* 8.5* 8.7* 8.9 9.4  MG 2.1 2.0 1.9 2.0  --  2.0  --   PHOS 1.2* 2.3* 2.1* 2.2* 2.5 2.7  --     Studies: DG Abd Portable 1V  Result Date: 11/18/2020 CLINICAL DATA:  Ileus following gastrointestinal surgery, continued abdominal pain and distension EXAM: PORTABLE ABDOMEN - 1 VIEW COMPARISON:  11/16/2020 FINDINGS: Persistent marked gaseous distention of small bowel loops favor postoperative ileus though small-bowel obstruction is not entirely excluded. Minimal colonic gas and stool. No bowel wall thickening. Bones demineralized with biconvex thoracolumbar scoliosis. IMPRESSION: Persistent  gaseous distension of small bowel loops favor postoperative ileus though obstruction not entirely excluded. Electronically Signed   By: Lavonia Dana M.D.   On: 11/18/2020 14:09    Scheduled Meds: . apixaban  5 mg Oral BID  . Chlorhexidine Gluconate Cloth  6 each Topical Daily  . diltiazem  180 mg Oral Daily  . famotidine  20 mg Oral Daily  . feeding supplement  1 Container Oral TID BM  . ferrous sulfate  325 mg Oral Q breakfast  . lactobacillus acidophilus & bulgar  1 tablet Oral TID WC  . lip balm  1 application Topical BID  . liver oil-zinc oxide   Topical BID  . metoprolol succinate  100 mg Oral Daily  . metoprolol succinate  50 mg Oral Q2200  . simethicone  40 mg Oral QID  . sodium chloride flush  10-40 mL Intracatheter Q12H   Continuous Infusions: . methocarbamol (ROBAXIN) IV    . TPN ADULT (ION) 40 mL/hr at 11/18/20 1712   PRN Meds: acetaminophen,  acetaminophen, alum & mag hydroxide-simeth, artificial tears, bisacodyl, fentaNYL (SUBLIMAZE) injection, LORazepam, magic mouthwash, menthol-cetylpyridinium, methocarbamol (ROBAXIN) IV, metoprolol tartrate, ondansetron **OR** ondansetron (ZOFRAN) IV, phenol, prochlorperazine **OR** prochlorperazine, sodium chloride flush, traMADol  Time spent: 35 minutes  Author: Berle Mull, MD [facility] 11/18/2020 7:10 PM  To reach On-call, see care teams to locate the attending and reach out via [email or web address]. Between 7PM-7AM, please contact night-coverage If you still have difficulty reaching the attending provider, please page the [facility] (Director on Call) for [facility] on amion for assistance.

## 2020-11-19 DIAGNOSIS — C180 Malignant neoplasm of cecum: Secondary | ICD-10-CM | POA: Diagnosis not present

## 2020-11-19 LAB — CBC
HCT: 32.2 % — ABNORMAL LOW (ref 36.0–46.0)
Hemoglobin: 9.4 g/dL — ABNORMAL LOW (ref 12.0–15.0)
MCH: 24 pg — ABNORMAL LOW (ref 26.0–34.0)
MCHC: 29.2 g/dL — ABNORMAL LOW (ref 30.0–36.0)
MCV: 82.1 fL (ref 80.0–100.0)
Platelets: 414 10*3/uL — ABNORMAL HIGH (ref 150–400)
RBC: 3.92 MIL/uL (ref 3.87–5.11)
RDW: 25.4 % — ABNORMAL HIGH (ref 11.5–15.5)
WBC: 10.2 10*3/uL (ref 4.0–10.5)
nRBC: 0 % (ref 0.0–0.2)

## 2020-11-19 LAB — COMPREHENSIVE METABOLIC PANEL
ALT: 22 U/L (ref 0–44)
AST: 20 U/L (ref 15–41)
Albumin: 2.2 g/dL — ABNORMAL LOW (ref 3.5–5.0)
Alkaline Phosphatase: 156 U/L — ABNORMAL HIGH (ref 38–126)
Anion gap: 7 (ref 5–15)
BUN: 18 mg/dL (ref 8–23)
CO2: 26 mmol/L (ref 22–32)
Calcium: 9.4 mg/dL (ref 8.9–10.3)
Chloride: 105 mmol/L (ref 98–111)
Creatinine, Ser: 0.48 mg/dL (ref 0.44–1.00)
GFR, Estimated: >60 mL/min (ref >60)
Glucose, Bld: 132 mg/dL — ABNORMAL HIGH (ref 70–99)
Potassium: 3.8 mmol/L (ref 3.5–5.1)
Sodium: 138 mmol/L (ref 135–145)
Total Bilirubin: 0.9 mg/dL (ref 0.3–1.2)
Total Protein: 5.2 g/dL — ABNORMAL LOW (ref 6.5–8.1)

## 2020-11-19 LAB — GLUCOSE, CAPILLARY
Glucose-Capillary: 127 mg/dL — ABNORMAL HIGH (ref 70–99)
Glucose-Capillary: 123 mg/dL — ABNORMAL HIGH (ref 70–99)
Glucose-Capillary: 123 mg/dL — ABNORMAL HIGH (ref 70–99)
Glucose-Capillary: 129 mg/dL — ABNORMAL HIGH (ref 70–99)

## 2020-11-19 LAB — MAGNESIUM: Magnesium: 1.9 mg/dL (ref 1.7–2.4)

## 2020-11-19 LAB — PHOSPHORUS: Phosphorus: 2.8 mg/dL (ref 2.5–4.6)

## 2020-11-19 MED ORDER — TRAVASOL 10 % IV SOLN
INTRAVENOUS | Status: AC
  Filled 2020-11-19: qty 480

## 2020-11-19 MED ORDER — POTASSIUM CHLORIDE 10 MEQ/100ML IV SOLN: 10.0000 meq | INTRAVENOUS | Status: DC

## 2020-11-19 NOTE — Progress Notes (Signed)
Occupational Therapy Treatment Patient Details Name: Robin [name] MRN: [id number] DOB: Aug 17, 1934 Today's Date: 11/19/2020    History of present illness 86 year old F presented to ED for anemia and shortness of breath, and admitted for blood loss anemia due to GI bleed.  Hgb 8.5 (reportedly 11 in January 2022 per daughter).  Hemoccult positive.  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 s/p laparoscopic hemicolectomy 10/31/20. Pt  with PMH of A. fib on Eliquis and HTN. Hospitilization complicated by ileus. Required reinsertion of NG tube 5/26.   OT comments  Patient exhibited improved activity tolerance and ability to participate in toileting today. No complaints of abdominal pain. Patient min assist to rise from low bed height but otherwise min guard with RW to ambulate to bathroomo, in room and in hallway approx 300 feet. Patient able to perform toileting in bathroom - able to manage Depends and wiping. With changing Depends patient needed assistance to doff and don over feet. Patient is progressing towards goals. Continue to recommend short term rehab at discharge.     Follow Up Recommendations  SNF    Equipment Recommendations  None recommended by OT    Recommendations for Other Services      Precautions / Restrictions Precautions Precaution Comments: abdominal surgery Restrictions Weight Bearing Restrictions: No       Mobility Bed Mobility Overal bed mobility: Needs Assistance Bed Mobility: Supine to Sit Rolling: Supervision   Supine to sit: Supervision;HOB elevated          Transfers Overall transfer level: Needs assistance Equipment used: Rolling walker (2 wheeled) Transfers: Sit to/from Stand Sit to Stand: Min assist Stand pivot transfers: Min guard       General transfer comment: Min assist to rise from bed initially. Otherwise min guard to ambulate with RW to bathroom, in  room and in hall x approx 300 feet.    Balance Overall balance assessment: Mild deficits observed, not formally tested                                         ADL either performed or assessed with clinical judgement   ADL Overall ADL's : Needs assistance/impaired                     Lower Body Dressing: Moderate assistance Lower Body Dressing Details (indicate cue type and reason): needs assistance to get Depends doffed over feet and then new one doffed over left foot. Able to pull up on her own. Toilet Transfer: Tour manager Toilet;Grab bars;RW   Toileting- Water quality scientist and Hygiene: Min guard;Sitting/lateral lean Toileting - Clothing Manipulation Details (indicate cue type and reason): able to manage clothing and wiping in seated position.             Vision Patient Visual Report: No change from baseline     Perception     Praxis      Cognition Arousal/Alertness: Awake/alert Behavior During Therapy: WFL for tasks assessed/performed Overall Cognitive Status: Within Functional Limits for tasks assessed                                          Exercises     Shoulder Instructions       General Comments  Pertinent Vitals/ Pain       Pain Assessment: No/denies pain  Home Living                                          Prior Functioning/Environment              Frequency  Min 2X/week        Progress Toward Goals  OT Goals(current goals can now be found in the care plan section)  Progress towards OT goals: Progressing toward goals  Acute Rehab OT Goals Patient Stated Goal: to get stronger OT Goal Formulation: With patient Time For Goal Achievement: 11/28/20 Potential to Achieve Goals: Good  Plan Discharge plan remains appropriate    Co-evaluation                 AM-PAC OT "6 Clicks" Daily Activity     Outcome Measure   Help from another person eating  meals?: None Help from another person taking care of personal grooming?: A Little Help from another person toileting, which includes using toliet, bedpan, or urinal?: A Little Help from another person bathing (including washing, rinsing, drying)?: A Little Help from another person to put on and taking off regular upper body clothing?: A Little Help from another person to put on and taking off regular lower body clothing?: A Lot 6 Click Score: 18    End of Session Equipment Utilized During Treatment: Rolling walker  OT Visit Diagnosis: Pain;Other abnormalities of gait and mobility (R26.89);Unsteadiness on feet (R26.81)   Activity Tolerance Patient tolerated treatment well   Patient Left in chair;with call bell/phone within reach;with family/visitor present   Nurse Communication Mobility status        Time: 0211-1552 OT Time Calculation (min): 26 min  Charges: OT General Charges $OT Visit: 1 Visit OT Treatments $Self Care/Home Management : 8-22 mins $Therapeutic Activity: 8-22 mins  Amy Belloso, OTR/L [place]  Office [phone] Pager: [place] 11/19/2020, 3:11 PM

## 2020-11-19 NOTE — Progress Notes (Signed)
PHARMACY - TOTAL PARENTERAL NUTRITION CONSULT NOTE   Indication: Small bowel obstruction; anticipate prolonged NPO status  Patient Measurements: Height: 5\' 4"  (162.6 cm) Weight: 64.4 kg (141 lb 15.6 oz) IBW/kg (Calculated) : 54.7 TPN AdjBW (KG): 65.4 Body mass index is 24.37 kg/m. Usual Weight: 66 kg (~10 lb weight loss this admission)  Assessment:  72 yoF with PMH Afib admitted 5/11 with anemia; found to have bleeding colonic mass suspicious for malignancy and underwent hemicolectomy 5/16. Developed ileus POD3 which initially improved, then worsened POD10. CT showed massive SBO and no immediate indication for surgery, so Pharmacy consulted to begin TPN for expected prolonged ileus.  Glucose / Insulin: no Hx DM; CBGs within goal after decreasing TPN to 1/2 rate Electrolytes: all WNL, potassium down 3.8, magnesium down 1.9 Renal: SCr, BUN, bicarb stable WNL; UOP not fully charted Hepatic: 5/30 Tbili improved to WNL; LFTs WNL except Alk Phos slightly elevated and albumin low/stable; TG stable WNL on 5/30 Prealbumin: low; minimally improved (5.7 >> 6.1) since starting TPN I/O: d/c NGT 5/29 - dCHF and diuresing since 5/29, no MIVF - still with diarrhea but CCS note, BMs appear to be becoming more formed GI Imaging: - 5/13 colonoscopy: multiple polyps; larger masses at cecum and transverse colon - 5/14 MRI liver: no metastatic dz - 5/18 AXR: dilated SB loops; possible ileus - 5/26 CTa/p: severely dilated SB loops (not including ileocolonic anastomosis) concerning for distal SBO, also intramural and intrahepatic portal venous gas suggesting possible ischemic bowel - 5/31 mildly distended bowel loops: SBO vs ileus Surgeries / Procedures:  - 5/16 laparoscopic hemicolectomy/cecectomy; omentopexy of ileocolonic anastomosis  Central access: PICC placed 5/27 TPN start date: 5/27  Nutritional Goals RD recs 5/27 Kcal:  1700-1900 kcal Protein:  80-95 grams Fluid:  >/= 2 L/day  TPN at goal  rate of 75 mL/hr provides 90 g of protein and 1818 kcals per day  Current Nutrition:  Soft diet - ongoing calorie count per dietary TPN at 40 ml/hr - per 6/3 CCS note, continue 1/2 rate TPN and soft diet with protein supplements  Plan: At 1800:  Continue TPN at 1/2 rate per Surgery  Electrolytes in TPN  Na - 150 mEq/L  K - increase to 80 mEq/L  Ca - 55mEq/L  Mg - increase to 49mEq/L  Phos - 25 mmol/L  Cl:Ac ratio 1:1  Add standard MVI and trace elements to TPN  D/C SSI as long as remains on 1/2 rate TPN; CBGs very stable at goal  MIVF per primary  Monitor TPN labs on Mon/Thurs, BMET/mag in AM   Karisa Nesser P. Legrand Como, PharmD, [place] Please utilize Amion for appropriate phone number to reach the unit pharmacist ([place]) 11/19/2020 8:21 AM

## 2020-11-19 NOTE — Progress Notes (Signed)
19 Days Post-Op   Subjective/Chief Complaint: Complains of bloating. Vomited once yesterday   Objective: Vital signs in last 24 hours: Temp:  [97.7 F (36.5 C)-98.5 F (36.9 C)] 97.7 F (36.5 C) (06/04 0609) Pulse Rate:  [74-78] 78 (06/04 0609) Resp:  [17-20] 17 (06/04 0609) BP: (110-176)/(63-70) 110/63 (06/04 0609) SpO2:  [96 %-98 %] 98 % (06/04 0609) Last BM Date: 11/18/20  Intake/Output from previous day: 06/03 0701 - 06/04 0700 In: 808.7 [P.O.:360; I.V.:448.7] Out: 0  Intake/Output this shift: No intake/output data recorded.  General appearance: alert and cooperative Resp: clear to auscultation bilaterally Cardio: regular rate and rhythm GI: soft, distended. good bs  Lab Results:  Recent Labs    11/18/20 1151 11/19/20 0315  WBC 10.9* 10.2  HGB 10.1* 9.4*  HCT 34.1* 32.2*  PLT 447* 414*   BMET Recent Labs    11/18/20 1350 11/19/20 0315  NA 137 138  K 4.0 3.8  CL 103 105  CO2 27 26  GLUCOSE 152* 132*  BUN 19 18  CREATININE 0.55 0.48  CALCIUM 9.4 9.4   PT/INR No results for input(s): LABPROT, INR in the last 72 hours. ABG No results for input(s): PHART, HCO3 in the last 72 hours.  Invalid input(s): PCO2, PO2  Studies/Results: DG Abd Portable 1V  Result Date: 11/18/2020 CLINICAL DATA:  Ileus following gastrointestinal surgery, continued abdominal pain and distension EXAM: PORTABLE ABDOMEN - 1 VIEW COMPARISON:  11/16/2020 FINDINGS: Persistent marked gaseous distention of small bowel loops favor postoperative ileus though small-bowel obstruction is not entirely excluded. Minimal colonic gas and stool. No bowel wall thickening. Bones demineralized with biconvex thoracolumbar scoliosis. IMPRESSION: Persistent gaseous distension of small bowel loops favor postoperative ileus though obstruction not entirely excluded. Electronically Signed   By: Lavonia Dana M.D.   On: 11/18/2020 14:09    Anti-infectives: Anti-infectives (From admission, onward)   Start      Dose/Rate Route Frequency Ordered Stop   11/16/20 1200  Ampicillin-Sulbactam (UNASYN) 3 g in sodium chloride 0.9 % 100 mL IVPB        3 g 200 mL/hr over 30 Minutes Intravenous Every 6 hours 11/16/20 0755 11/17/20 0840   11/15/20 1200  Ampicillin-Sulbactam (UNASYN) 3 g in sodium chloride 0.9 % 100 mL IVPB  Status:  Discontinued        3 g 200 mL/hr over 30 Minutes Intravenous Every 6 hours 11/15/20 1055 11/16/20 0755   11/11/20 1800  anidulafungin (ERAXIS) 100 mg in sodium chloride 0.9 % 100 mL IVPB  Status:  Discontinued       "Followed by" Linked Group Details   100 mg 78 mL/hr over 100 Minutes Intravenous Every 24 hours 11/10/20 1622 11/15/20 0815   11/10/20 2200  piperacillin-tazobactam (ZOSYN) IVPB 3.375 g  Status:  Discontinued        3.375 g 12.5 mL/hr over 240 Minutes Intravenous Every 8 hours 11/10/20 1610 11/15/20 1035   11/10/20 1800  anidulafungin (ERAXIS) 200 mg in sodium chloride 0.9 % 200 mL IVPB       "Followed by" Linked Group Details   200 mg 78 mL/hr over 200 Minutes Intravenous  Once 11/10/20 1622 11/11/20 0016   11/10/20 1700  anidulafungin (ERAXIS) 100 mg in sodium chloride 0.9 % 100 mL IVPB  Status:  Discontinued        100 mg 78 mL/hr over 100 Minutes Intravenous Every 24 hours 11/10/20 1610 11/10/20 1621   11/10/20 1700  piperacillin-tazobactam (ZOSYN) IVPB 3.375 g  3.375 g 100 mL/hr over 30 Minutes Intravenous  Once 11/10/20 1618 11/10/20 1720   11/08/20 0045  vancomycin (VANCOCIN) 50 mg/mL oral solution 125 mg  Status:  Discontinued        125 mg Oral Every 6 hours 11/07/20 2354 11/08/20 1304   10/31/20 2200  cefoTEtan (CEFOTAN) 2 g in sodium chloride 0.9 % 100 mL IVPB        2 g 200 mL/hr over 30 Minutes Intravenous Every 12 hours 10/31/20 1744 10/31/20 2205   10/31/20 0915  cefoTEtan (CEFOTAN) 2 g in sodium chloride 0.9 % 100 mL IVPB        2 g 200 mL/hr over 30 Minutes Intravenous On call to O.R. 10/31/20 0824 10/31/20 1312       Assessment/Plan: s/p Procedure(s): LAPAROSCOPIC RIGHT COLON RESECTION, lysis of adhesions, hemicolectomy, bilateral tap block (N/A) continue bowel rest  Ileus vs sbo I would recommend trying to minimize oral meds until this resolves POD#18-s/p lap assisted right hemicolectomy for ascending colon mass (adenocarcinoma) and multiple polyps (likely pre-malignant), Dr. Johney Maine 10/31/20 -Continue TPN at half rate and continue soft diet as well as protein supplements. On fiber and iron supplement for diarrhea, imodium BID PRN. Will add antidiarrheals very slowly in this patient. - worsened distension, nausea/emesis - abd xray pending, WBC and BMP pending - consult to dietician and continue calorie count today - Hopefully can d/c TPN soon - Continue mobilizing, PT/OT. Currently recommending SNF - patient agreeable and sounds like she has a plan for [facility] when medically ready for discharge  Acute blood loss anemiain setting of chronic GI losses from cancer.H/h stable since restarting eliquis 5/30. Continue supplemental iron  FEN -soft diet, TPN 1/2 rate - dietician consult VTE -Eliquis ID -Cefotetanpre-op, PO vancomycin 5/24>>5/24, zosyn 5/26>>5/31, eraxis 5/26>>5/31, unasyn 5/31>> 6/1 (for PNA)  Atrial fibrillation-stable backonhome medstoprol,cardizem, eliquis HTN HLD Diarrhea - c diffnegative5/23 Severe malnutrition - prealbumin 5.7 (5/27)>> 6.1 (5/30),advancing diet and weaning TPN Possible aspiration PNA - completed unasyn --per TRH--  LOS: 23 days    Autumn Messing III 11/19/2020

## 2020-11-19 NOTE — Progress Notes (Signed)
[facility] Progress Note  Patient: Robin [name]    FXJ:[id number]  DOA: 10/26/2020     Date of Service: the patient was seen and examined on 11/19/2020  Brief hospital course: Past medical history of paroxysmal A. fib on Eliquis, HTN.  Presents with complaints of anemia found to have colonic neoplasm.  Underwent laparoscopic hemicolectomy and reanastomosis on 5/16.  Postop developed ileus requiring prolonged TPN. Developed fever on 5/26 concerning for pneumatosis based on the work-up and started on broad-spectrum IV antibiotics. Currently plan is monitor for improvement.  Assessment and Plan: 1.  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  Biopsy was positive for invasive adenocarcinoma. SP laparoscopic proximal hemicolectomy with reanastomosis by Dr. Elinor Parkinson on 5/16. Surgical pathology with negative margins. Patient will require outpatient oncology follow-up. Management per surgery.  2.  Postop ileus Now frequent diarrhea Developed prolonged postop ileus requiring NG tube insertion as well as TPN. Ileus continues remain a problem.  Repeat x-ray on 6/3 continues to showed ileus. Continues to have poor p.o. intake as well. Surgery recommending liquid diet only for today. Currently TPN still at half dose. Continuing current medication. TPN reduced on 6/1. Repeat x-ray on 6/3 continues to show some distention likely from ileus.  Monitor.  3.  Concern for pneumatosis Aspiration pneumonia 5/26 developed fever with tachycardia and leukocytosis. CT abdomen concerning for small bowel dilation and gastric distention as well as pneumatosis along the duodenum and portal venous gas. NG tube replaced patient was started on IV Zosyn antibiotics this. No further antibiotic needed per surgery. Completed course for aspiration pneumonia.  4.  Acute on chronic GI bleeding. Iron deficiency anemia FOBT was  positive. Hemoglobin stable for now. Continue replacing iron and B12.  5.  Paroxysmal A. fib with RVR Rate currently controlled. On Toprol-XL, changing from twice daily to daily to minimize pill burden. Continue Cardizem and Eliquis. Monitor.  6.  Essential hypertension Blood pressure stable. Continue current regimen.  7.  Acute on chronic diastolic CHF Patient appears to have volume overload postoperatively likely due to need for the TPN pending Intermittently requiring Lasix. Echocardiogram shows preserved EF without any wall motion abnormality. Monitor.  8.  Hypokalemia, hypophosphatemia, hypomagnesemia. Currently being replaced.  Monitor.  Body mass index is 24.37 kg/m.  Nutrition Problem: Increased nutrient needs Etiology: acute illness Interventions: Interventions: TPN  9.  Stage II right buttock pressure ulcer. POA status not clear. Continue home dressing. Pressure Injury 11/14/20 Buttocks Right Stage 2 -  Partial thickness loss of dermis presenting as a shallow open injury with a red, pink wound bed without slough. (Active)  11/14/20 2230  Location: Buttocks  Location Orientation: Right  Staging: Stage 2 -  Partial thickness loss of dermis presenting as a shallow open injury with a red, pink wound bed without slough.  Wound Description (Comments):   Present on Admission:     Diet: Regular diet DVT Prophylaxis:   Place TED hose Start: 11/19/20 1210 SCDs Start: 10/26/20 2337 apixaban (ELIQUIS) tablet 5 mg    Advance goals of care discussion: Full code  Family Communication: no family was present at bedside, at the time of interview.  Discussed with daughter on the phone.  Disposition:  Status is: Inpatient  Remains inpatient appropriate because:IV treatments appropriate due to intensity of illness or inability to take PO  Dispo: The patient is from: Home  Anticipated d/c is to: SNF              Patient currently is not medically stable to  d/c.   Difficult to place patient No  Subjective: 1 episode of vomiting yesterday.  Passing gas.  No BM so far today.  Abdomen appears to be more tense per patient.  Physical Exam:  General: Appear in mild distress, no Rash; Oral Mucosa Clear, moist. no Abnormal Neck Mass Or lumps, Conjunctiva normal  Cardiovascular: S1 and S2 Present, no Murmur, Respiratory: good respiratory effort, Bilateral Air entry present and CTA, no Crackles, no wheezes Abdomen: Bowel Sound present, Soft and, distended no tenderness Extremities: no Pedal edema Neurology: alert and oriented to time, place, and person affect appropriate. no new focal deficit Gait not checked due to patient safety concerns    Vitals:   11/18/20 1400 11/18/20 2107 11/19/20 0609 11/19/20 1322  BP: (!) 176/70 124/70 110/63 132/71  Pulse: 74 74 78 79  Resp: 18 20 17 16   Temp: 98.5 F (36.9 C) 97.8 F (36.6 C) 97.7 F (36.5 C)   TempSrc: Oral Oral Oral   SpO2: 98% 96% 98% 98%  Weight:      Height:        Intake/Output Summary (Last 24 hours) at 11/19/2020 1742 Last data filed at 11/19/2020 1400 Gross per 24 hour  Intake 1120 ml  Output --  Net 1120 ml   Filed Weights   11/15/20 0500 11/16/20 0609 11/18/20 0642  Weight: 61.3 kg 64.5 kg 64.4 kg    Data Reviewed: I have personally reviewed and interpreted daily labs, tele strips, imaging. I reviewed all nursing notes, pharmacy notes, vitals, pertinent old records I have discussed plan of care as described above with RN and patient/family.  CBC: Recent Labs  Lab 11/14/20 0355 11/16/20 0238 11/16/20 1320 11/17/20 0312 11/18/20 0239 11/18/20 1151 11/19/20 0315  WBC 10.8*  --  12.0* 9.4  --  10.9* 10.2  NEUTROABS 8.4*  --   --   --   --   --   --   HGB 9.3*   < > 9.8* 9.0* 9.6* 10.1* 9.4*  HCT 31.0*  --  33.6* 29.8*  --  34.1* 32.2*  MCV 80.5  --  79.8* 79.9*  --  82.2 82.1  PLT 428*  --  434* 389  --  447* 414*   < > = values in this interval not displayed.    Basic Metabolic Panel: Recent Labs  Lab 11/13/20 0300 11/14/20 0355 11/15/20 0417 11/16/20 0910 11/17/20 0312 11/18/20 1350 11/19/20 0315  NA 136 136 133* 134* 135 137 138  K 3.5 3.4* 3.5 4.0 3.9 4.0 3.8  CL 106 103 101 100 103 103 105  CO2 25 26 28 27 28 27 26   GLUCOSE 135* 156* 148* 157* 126* 152* 132*  BUN 19 16 19 20 22 19 18   CREATININE 0.45 0.54 0.46 0.46 0.47 0.55 0.48  CALCIUM 8.3* 8.6* 8.5* 8.7* 8.9 9.4 9.4  MG 2.0 1.9 2.0  --  2.0  --  1.9  PHOS 2.3* 2.1* 2.2* 2.5 2.7  --  2.8    Studies: No results found.  Scheduled Meds: . apixaban  5 mg Oral BID  . Chlorhexidine Gluconate Cloth  6 each Topical Daily  . diltiazem  180 mg Oral Daily  . famotidine  20 mg Oral Daily  . feeding supplement  1 Container Oral TID BM  . ferrous sulfate  325 mg  Oral Q breakfast  . lactobacillus acidophilus & bulgar  1 tablet Oral TID WC  . lip balm  1 application Topical BID  . liver oil-zinc oxide   Topical BID  . metoprolol succinate  100 mg Oral Daily  . simethicone  40 mg Oral QID  . sodium chloride flush  10-40 mL Intracatheter Q12H   Continuous Infusions: . methocarbamol (ROBAXIN) IV    . TPN ADULT (ION) 40 mL/hr at 11/19/20 1400  . TPN ADULT (ION) 40 mL/hr at 11/19/20 1712   PRN Meds: acetaminophen, acetaminophen, alum & mag hydroxide-simeth, artificial tears, bisacodyl, fentaNYL (SUBLIMAZE) injection, LORazepam, magic mouthwash, menthol-cetylpyridinium, methocarbamol (ROBAXIN) IV, metoprolol tartrate, ondansetron **OR** ondansetron (ZOFRAN) IV, phenol, prochlorperazine **OR** prochlorperazine, sodium chloride flush, traMADol  Time spent: 35 minutes  Author: Berle Mull, MD [facility] 11/19/2020 5:42 PM  To reach On-call, see care teams to locate the attending and reach out via [email or web address]. Between 7PM-7AM, please contact night-coverage If you still have difficulty reaching the attending provider, please page the [facility] (Director on Call) for [facility]  on amion for assistance.

## 2020-11-20 DIAGNOSIS — C180 Malignant neoplasm of cecum: Secondary | ICD-10-CM | POA: Diagnosis not present

## 2020-11-20 LAB — BASIC METABOLIC PANEL
Anion gap: 6 (ref 5–15)
BUN: 21 mg/dL (ref 8–23)
CO2: 27 mmol/L (ref 22–32)
Calcium: 9.5 mg/dL (ref 8.9–10.3)
Chloride: 105 mmol/L (ref 98–111)
Creatinine, Ser: 0.53 mg/dL (ref 0.44–1.00)
GFR, Estimated: >60 mL/min (ref >60)
Glucose, Bld: 128 mg/dL — ABNORMAL HIGH (ref 70–99)
Potassium: 4.1 mmol/L (ref 3.5–5.1)
Sodium: 138 mmol/L (ref 135–145)

## 2020-11-20 LAB — GLUCOSE, CAPILLARY
Glucose-Capillary: 127 mg/dL — ABNORMAL HIGH (ref 70–99)
Glucose-Capillary: 130 mg/dL — ABNORMAL HIGH (ref 70–99)
Glucose-Capillary: 121 mg/dL — ABNORMAL HIGH (ref 70–99)
Glucose-Capillary: 119 mg/dL — ABNORMAL HIGH (ref 70–99)

## 2020-11-20 LAB — MAGNESIUM: Magnesium: 2.3 mg/dL (ref 1.7–2.4)

## 2020-11-20 MED ORDER — TRAVASOL 10 % IV SOLN
INTRAVENOUS | Status: AC
  Filled 2020-11-20: qty 900

## 2020-11-20 MED ORDER — TRAVASOL 10 % IV SOLN
INTRAVENOUS | Status: DC
  Filled 2020-11-20: qty 480

## 2020-11-20 NOTE — Progress Notes (Signed)
20 Days Post-Op   Subjective/Chief Complaint: Complains only of diarrhea   Objective: Vital signs in last 24 hours: Temp:  [97.4 F (36.3 C)-97.9 F (36.6 C)] 97.4 F (36.3 C) (06/05 0459) Pulse Rate:  [74-82] 82 (06/05 0459) Resp:  [16] 16 (06/05 0459) BP: (121-132)/(62-72) 128/62 (06/05 0459) SpO2:  [98 %] 98 % (06/05 0459) Last BM Date: 11/19/20  Intake/Output from previous day: 06/04 0701 - 06/05 0700 In: 2149.1 [P.O.:1230; I.V.:919.1] Out: -  Intake/Output this shift: No intake/output data recorded.  General appearance: alert and cooperative Resp: clear to auscultation bilaterally Cardio: regular rate and rhythm GI: soft, slightly less distended today  Lab Results:  Recent Labs    11/18/20 1151 11/19/20 0315  WBC 10.9* 10.2  HGB 10.1* 9.4*  HCT 34.1* 32.2*  PLT 447* 414*   BMET Recent Labs    11/19/20 0315 11/20/20 0304  NA 138 138  K 3.8 4.1  CL 105 105  CO2 26 27  GLUCOSE 132* 128*  BUN 18 21  CREATININE 0.48 0.53  CALCIUM 9.4 9.5   PT/INR No results for input(s): LABPROT, INR in the last 72 hours. ABG No results for input(s): PHART, HCO3 in the last 72 hours.  Invalid input(s): PCO2, PO2  Studies/Results: DG Abd Portable 1V  Result Date: 11/18/2020 CLINICAL DATA:  Ileus following gastrointestinal surgery, continued abdominal pain and distension EXAM: PORTABLE ABDOMEN - 1 VIEW COMPARISON:  11/16/2020 FINDINGS: Persistent marked gaseous distention of small bowel loops favor postoperative ileus though small-bowel obstruction is not entirely excluded. Minimal colonic gas and stool. No bowel wall thickening. Bones demineralized with biconvex thoracolumbar scoliosis. IMPRESSION: Persistent gaseous distension of small bowel loops favor postoperative ileus though obstruction not entirely excluded. Electronically Signed   By: Lavonia Dana M.D.   On: 11/18/2020 14:09    Anti-infectives: Anti-infectives (From admission, onward)   Start     Dose/Rate  Route Frequency Ordered Stop   11/16/20 1200  Ampicillin-Sulbactam (UNASYN) 3 g in sodium chloride 0.9 % 100 mL IVPB        3 g 200 mL/hr over 30 Minutes Intravenous Every 6 hours 11/16/20 0755 11/17/20 0840   11/15/20 1200  Ampicillin-Sulbactam (UNASYN) 3 g in sodium chloride 0.9 % 100 mL IVPB  Status:  Discontinued        3 g 200 mL/hr over 30 Minutes Intravenous Every 6 hours 11/15/20 1055 11/16/20 0755   11/11/20 1800  anidulafungin (ERAXIS) 100 mg in sodium chloride 0.9 % 100 mL IVPB  Status:  Discontinued       "Followed by" Linked Group Details   100 mg 78 mL/hr over 100 Minutes Intravenous Every 24 hours 11/10/20 1622 11/15/20 0815   11/10/20 2200  piperacillin-tazobactam (ZOSYN) IVPB 3.375 g  Status:  Discontinued        3.375 g 12.5 mL/hr over 240 Minutes Intravenous Every 8 hours 11/10/20 1610 11/15/20 1035   11/10/20 1800  anidulafungin (ERAXIS) 200 mg in sodium chloride 0.9 % 200 mL IVPB       "Followed by" Linked Group Details   200 mg 78 mL/hr over 200 Minutes Intravenous  Once 11/10/20 1622 11/11/20 0016   11/10/20 1700  anidulafungin (ERAXIS) 100 mg in sodium chloride 0.9 % 100 mL IVPB  Status:  Discontinued        100 mg 78 mL/hr over 100 Minutes Intravenous Every 24 hours 11/10/20 1610 11/10/20 1621   11/10/20 1700  piperacillin-tazobactam (ZOSYN) IVPB 3.375 g  3.375 g 100 mL/hr over 30 Minutes Intravenous  Once 11/10/20 1618 11/10/20 1720   11/08/20 0045  vancomycin (VANCOCIN) 50 mg/mL oral solution 125 mg  Status:  Discontinued        125 mg Oral Every 6 hours 11/07/20 2354 11/08/20 1304   10/31/20 2200  cefoTEtan (CEFOTAN) 2 g in sodium chloride 0.9 % 100 mL IVPB        2 g 200 mL/hr over 30 Minutes Intravenous Every 12 hours 10/31/20 1744 10/31/20 2205   10/31/20 0915  cefoTEtan (CEFOTAN) 2 g in sodium chloride 0.9 % 100 mL IVPB        2 g 200 mL/hr over 30 Minutes Intravenous On call to O.R. 10/31/20 0824 10/31/20 1312      Assessment/Plan: s/p  Procedure(s): LAPAROSCOPIC RIGHT COLON RESECTION, lysis of adhesions, hemicolectomy, bilateral tap block (N/A) continue clears  Recheck abd xrays tomorrow Ileus vs sbo I would recommend trying to minimize oral meds until this resolves POD#20-s/p lap assisted right hemicolectomy for ascending colon mass (adenocarcinoma) and multiple polyps (likely pre-malignant), Dr. Johney Maine 10/31/20 -Continue TPN at half rate and continue soft diet as well as protein supplements. On fiber and iron supplement for diarrhea, imodium BID PRN. Will add antidiarrheals very slowly in this patient. - worsened distension, nausea/emesis - abd xray pending, WBC and BMP pending - consult to dietician andcontinuecalorie count today - Hopefully can d/c TPN soon - Continue mobilizing, PT/OT. Currently recommending SNF - patient agreeable and sounds like she has a plan for [facility] when medically ready for discharge  Acute blood loss anemiain setting of chronic GI losses from cancer.H/h stable since restarting eliquis 5/30. Continue supplemental iron  FEN -soft diet, TPN 1/2 rate - dietician consult VTE -Eliquis ID -Cefotetanpre-op, PO vancomycin 5/24>>5/24, zosyn 5/26>>5/31, eraxis 5/26>>5/31, unasyn 5/31>> 6/1 (for PNA)  Atrial fibrillation-stable backonhome medstoprol,cardizem, eliquis HTN HLD Diarrhea - c diffnegative5/23 Severe malnutrition - prealbumin 5.7 (5/27)>> 6.1 (5/30),advancing diet and weaning TPN Possible aspiration PNA - completed unasyn --per TRH--  LOS: 24 days    Robin [name] 11/20/2020

## 2020-11-20 NOTE — Progress Notes (Addendum)
PHARMACY - TOTAL PARENTERAL NUTRITION CONSULT NOTE   Indication: Small bowel obstruction; anticipate prolonged NPO status  Patient Measurements: Height: _0  (162.6 cm) Weight: 64.4 kg (141 lb 15.6 oz) IBW/kg (Calculated) : 54.7 TPN AdjBW (KG): 65.4 Body mass index is 24.37 kg/m. Usual Weight: 66 kg (~10 lb weight loss this admission)  Assessment:  55 yoF with PMH Afib admitted 5/11 with anemia; found to have bleeding colonic mass suspicious for malignancy and underwent hemicolectomy 5/16. Developed ileus POD3 which initially improved, then worsened POD10. CT showed massive SBO and no immediate indication for surgery, so Pharmacy consulted to begin TPN for expected prolonged ileus.  Glucose / Insulin: no Hx DM; CBGs within goal after decreasing TPN to 1/2 rate Electrolytes: all WNL Renal: SCr, BUN, bicarb stable WNL; UOP not fully charted Hepatic: 5/30 Tbili improved to WNL; LFTs WNL except Alk Phos slightly elevated and albumin low/stable; TG stable WNL on 5/30 Prealbumin: low; minimally improved (5.7 >> 6.1) since starting TPN I/O: d/c NGT 5/29 - dCHF and diuresing since 5/29, no MIVF - still with diarrhea but CCS note, BMs appear to be becoming more formed GI Imaging: - 5/13 colonoscopy: multiple polyps; larger masses at cecum and transverse colon - 5/14 MRI liver: no metastatic dz - 5/18 AXR: dilated SB loops; possible ileus - 5/26 CTa/p: severely dilated SB loops (not including ileocolonic anastomosis) concerning for distal SBO, also intramural and intrahepatic portal venous gas suggesting possible ischemic bowel - 5/31 mildly distended bowel loops: SBO vs ileus Surgeries / Procedures:  - 5/16 laparoscopic hemicolectomy/cecectomy; omentopexy of ileocolonic anastomosis  Central access: PICC placed 5/27 TPN start date: 5/27  Nutritional Goals RD recs 5/27 Kcal:  1700-1900 kcal Protein:  80-95 grams Fluid:  >/= 2 L/day  TPN at goal rate of 75 mL/hr provides 90 g of  protein and 1818 kcals per day  Current Nutrition:  Soft diet - ongoing calorie count per dietary TPN at 40 ml/hr - surgery and hospitalist would like to go back to full strength TPN due to Poor PO intake  Plan: At 1800:  Increase TPN back to full rate at 75 ml/hr  Electrolytes in TPN  Na - 75 mEq/L  K - 40 mEq/L  Ca - 5 mEq/L  Mg - 5 mEq/L  Phos - 12 mmol/L  Cl:Ac ratio 1:1  Add standard MVI and trace elements to TPN  D/C SSI as long as remains on 1/2 rate TPN; CBGs very stable at goal  MIVF per primary  Monitor TPN labs on Mon/Thurs   Robin [name], PharmD, [place] Please utilize Amion for appropriate phone number to reach the unit pharmacist ([place]) 11/20/2020 9:22 AM

## 2020-11-20 NOTE — Progress Notes (Signed)
[facility] Progress Note  Patient: Robin [name]    YHC:[id number]  DOA: 10/26/2020     Date of Service: the patient was seen and examined on 11/20/2020  Brief hospital course: Past medical history of paroxysmal A. fib on Eliquis, HTN.  Presents with complaints of anemia found to have colonic neoplasm.  Underwent laparoscopic hemicolectomy and reanastomosis on 5/16.  Postop developed ileus requiring prolonged TPN. Developed fever on 5/26 concerning for pneumatosis based on the work-up and started on broad-spectrum IV antibiotics. Currently plan is monitor for improvement.  Assessment and Plan: 1.  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  Biopsy was positive for invasive adenocarcinoma. SP laparoscopic proximal hemicolectomy with reanastomosis by Dr. Elinor Parkinson on 5/16. Surgical pathology with negative margins. Patient will require outpatient oncology follow-up. Management per surgery.  2.  Postop ileus Now frequent diarrhea Developed prolonged postop ileus requiring NG tube insertion as well as TPN. Ileus continues remain a problem.  Repeat x-ray on 6/3 continues to showed ileus. Continues to have poor p.o. intake as well. Surgery recommending clear liquid diet. Continuing current medication. TPN reduced on 6/1.  Based on the discussion with general surgery, back to full strength on 6/5. Repeat x-ray on 6/3 continues to show some distention likely from ileus.  Monitor.  3.  Concern for pneumatosis Aspiration pneumonia 5/26 developed fever with tachycardia and leukocytosis. CT abdomen concerning for small bowel dilation and gastric distention as well as pneumatosis along the duodenum and portal venous gas. NG tube replaced patient was started on IV Zosyn antibiotics this. No further antibiotic needed per surgery. Completed course for aspiration pneumonia.  4.  Acute on chronic GI bleeding. Iron deficiency  anemia FOBT was positive. Hemoglobin stable for now. Continue replacing iron and B12.  5.  Paroxysmal A. fib with RVR Rate currently controlled. On Toprol-XL, changing from twice daily to daily to minimize pill burden. Continue Cardizem and Eliquis. Monitor.  6.  Essential hypertension Blood pressure stable. Continue current regimen.  7.  Acute on chronic diastolic CHF Patient appears to have volume overload postoperatively likely due to need for the TPN pending Intermittently requiring Lasix. Echocardiogram shows preserved EF without any wall motion abnormality. Monitor.  8.  Hypokalemia, hypophosphatemia, hypomagnesemia. Currently being replaced.  Monitor.  9.  Diarrhea. Patient had multiple bouts of diarrhea in last 24 hours. Surgery recommended no intervention for now as it will worsen her ileus.  Anticipating improvement on its own in 24 hours. Monitor electrolyte. No significant abdominal pain and abdomen actually appears soft therefore no concern for infectious etiology.  Body mass index is 24.37 kg/m.  Nutrition Problem: Increased nutrient needs Etiology: acute illness Interventions: Interventions: TPN  9.  Stage II right buttock pressure ulcer. POA status not clear. Continue home dressing. Pressure Injury 11/14/20 Buttocks Right Stage 2 -  Partial thickness loss of dermis presenting as a shallow open injury with a red, pink wound bed without slough. (Active)  11/14/20 2230  Location: Buttocks  Location Orientation: Right  Staging: Stage 2 -  Partial thickness loss of dermis presenting as a shallow open injury with a red, pink wound bed without slough.  Wound Description (Comments):   Present on Admission:     Diet: Clear liquid diet DVT Prophylaxis:   Place TED hose Start: 11/19/20 1210 SCDs Start: 10/26/20 2337 apixaban (ELIQUIS) tablet 5 mg    Advance goals of care discussion: Full code  Family Communication: no family  was present at bedside, at the  time of interview.  Discussed with daughter on the phone.  Disposition:  Status is: Inpatient  Remains inpatient appropriate because:IV treatments appropriate due to intensity of illness or inability to take PO  Dispo: The patient is from: Home              Anticipated d/c is to: SNF              Patient currently is not medically stable to d/c.   Difficult to place patient No  Subjective: No further vomiting.  Abdomen appears soft.  Frequent moderate volume diarrhea in last 24 hours.  Without any blood.  Greenish bile-like in appearance.  Physical Exam:  General: Appear in mild distress, no Rash; Oral Mucosa Clear, moist. no Abnormal Neck Mass Or lumps, Conjunctiva normal  Cardiovascular: S1 and S2 Present, no Murmur, Respiratory: good respiratory effort, Bilateral Air entry present and CTA, no Crackles, no wheezes Abdomen: Bowel Sound present, Soft and distended but improving, no tenderness Extremities: no Pedal edema Neurology: alert and oriented to time, place, and person affect appropriate. no new focal deficit Gait not checked due to patient safety concerns   Vitals:   11/19/20 0609 11/19/20 1322 11/19/20 2200 11/20/20 0459  BP: 110/63 132/71 121/72 128/62  Pulse: 78 79 74 82  Resp: 17 16 16 16   Temp: 97.7 F (36.5 C)  97.9 F (36.6 C) (!) 97.4 F (36.3 C)  TempSrc: Oral  Axillary Oral  SpO2: 98% 98% 98% 98%  Weight:      Height:        Intake/Output Summary (Last 24 hours) at 11/20/2020 1850 Last data filed at 11/20/2020 1800 Gross per 24 hour  Intake 1909.1 ml  Output 0 ml  Net 1909.1 ml   Filed Weights   11/15/20 0500 11/16/20 0609 11/18/20 0642  Weight: 61.3 kg 64.5 kg 64.4 kg    Data Reviewed: I have personally reviewed and interpreted daily labs, tele strips, imaging. I reviewed all nursing notes, pharmacy notes, vitals, pertinent old records I have discussed plan of care as described above with RN and patient/family.  CBC: Recent Labs  Lab  11/14/20 0355 11/16/20 0238 11/16/20 1320 11/17/20 0312 11/18/20 0239 11/18/20 1151 11/19/20 0315  WBC 10.8*  --  12.0* 9.4  --  10.9* 10.2  NEUTROABS 8.4*  --   --   --   --   --   --   HGB 9.3*   < > 9.8* 9.0* 9.6* 10.1* 9.4*  HCT 31.0*  --  33.6* 29.8*  --  34.1* 32.2*  MCV 80.5  --  79.8* 79.9*  --  82.2 82.1  PLT 428*  --  434* 389  --  447* 414*   < > = values in this interval not displayed.   Basic Metabolic Panel: Recent Labs  Lab 11/14/20 0355 11/15/20 0417 11/16/20 0910 11/17/20 0312 11/18/20 1350 11/19/20 0315 11/20/20 0304  NA 136 133* 134* 135 137 138 138  K 3.4* 3.5 4.0 3.9 4.0 3.8 4.1  CL 103 101 100 103 103 105 105  CO2 26 28 27 28 27 26 27   GLUCOSE 156* 148* 157* 126* 152* 132* 128*  BUN 16 19 20 22 19 18 21   CREATININE 0.54 0.46 0.46 0.47 0.55 0.48 0.53  CALCIUM 8.6* 8.5* 8.7* 8.9 9.4 9.4 9.5  MG 1.9 2.0  --  2.0  --  1.9 2.3  PHOS 2.1* 2.2* 2.5 2.7  --  2.8  --     Studies: No results found.  Scheduled Meds: . apixaban  5 mg Oral BID  . Chlorhexidine Gluconate Cloth  6 each Topical Daily  . diltiazem  180 mg Oral Daily  . famotidine  20 mg Oral Daily  . feeding supplement  1 Container Oral TID BM  . ferrous sulfate  325 mg Oral Q breakfast  . lactobacillus acidophilus & bulgar  1 tablet Oral TID WC  . lip balm  1 application Topical BID  . liver oil-zinc oxide   Topical BID  . metoprolol succinate  100 mg Oral Daily  . simethicone  40 mg Oral QID  . sodium chloride flush  10-40 mL Intracatheter Q12H   Continuous Infusions: . methocarbamol (ROBAXIN) IV    . TPN ADULT (ION) 75 mL/hr at 11/20/20 1736   PRN Meds: acetaminophen, acetaminophen, alum & mag hydroxide-simeth, artificial tears, bisacodyl, fentaNYL (SUBLIMAZE) injection, LORazepam, magic mouthwash, menthol-cetylpyridinium, methocarbamol (ROBAXIN) IV, metoprolol tartrate, ondansetron **OR** ondansetron (ZOFRAN) IV, phenol, prochlorperazine **OR** prochlorperazine, sodium chloride  flush, traMADol  Time spent: 35 minutes  Author: Berle Mull, MD [facility] 11/20/2020 6:50 PM  To reach On-call, see care teams to locate the attending and reach out via [email or web address]. Between 7PM-7AM, please contact night-coverage If you still have difficulty reaching the attending provider, please page the [facility] (Director on Call) for [facility] on amion for assistance.

## 2020-11-21 ENCOUNTER — Inpatient Hospital Stay (HOSPITAL_COMMUNITY): Payer: Medicare PPO

## 2020-11-21 LAB — DIFFERENTIAL
Abs Immature Granulocytes: 0.15 10*3/uL — ABNORMAL HIGH (ref 0.00–0.07)
Basophils Absolute: 0 10*3/uL (ref 0.0–0.1)
Basophils Relative: 1 %
Eosinophils Absolute: 0.1 10*3/uL (ref 0.0–0.5)
Eosinophils Relative: 1 %
Immature Granulocytes: 2 %
Lymphocytes Relative: 14 %
Lymphs Abs: 1.2 10*3/uL (ref 0.7–4.0)
Monocytes Absolute: 0.6 10*3/uL (ref 0.1–1.0)
Monocytes Relative: 7 %
Neutro Abs: 6.7 10*3/uL (ref 1.7–7.7)
Neutrophils Relative %: 75 %

## 2020-11-21 LAB — COMPREHENSIVE METABOLIC PANEL
ALT: 37 U/L (ref 0–44)
AST: 36 U/L (ref 15–41)
Albumin: 2.4 g/dL — ABNORMAL LOW (ref 3.5–5.0)
Alkaline Phosphatase: 240 U/L — ABNORMAL HIGH (ref 38–126)
Anion gap: 5 (ref 5–15)
BUN: 23 mg/dL (ref 8–23)
CO2: 27 mmol/L (ref 22–32)
Calcium: 9.2 mg/dL (ref 8.9–10.3)
Chloride: 106 mmol/L (ref 98–111)
Creatinine, Ser: 0.47 mg/dL (ref 0.44–1.00)
GFR, Estimated: >60 mL/min (ref >60)
Glucose, Bld: 137 mg/dL — ABNORMAL HIGH (ref 70–99)
Potassium: 3.9 mmol/L (ref 3.5–5.1)
Sodium: 138 mmol/L (ref 135–145)
Total Bilirubin: 0.7 mg/dL (ref 0.3–1.2)
Total Protein: 5.5 g/dL — ABNORMAL LOW (ref 6.5–8.1)

## 2020-11-21 LAB — GLUCOSE, CAPILLARY
Glucose-Capillary: 167 mg/dL — ABNORMAL HIGH (ref 70–99)
Glucose-Capillary: 171 mg/dL — ABNORMAL HIGH (ref 70–99)
Glucose-Capillary: 128 mg/dL — ABNORMAL HIGH (ref 70–99)

## 2020-11-21 LAB — CBC
HCT: 32.5 % — ABNORMAL LOW (ref 36.0–46.0)
Hemoglobin: 9.5 g/dL — ABNORMAL LOW (ref 12.0–15.0)
MCH: 24.2 pg — ABNORMAL LOW (ref 26.0–34.0)
MCHC: 29.2 g/dL — ABNORMAL LOW (ref 30.0–36.0)
MCV: 82.9 fL (ref 80.0–100.0)
Platelets: 387 10*3/uL (ref 150–400)
RBC: 3.92 MIL/uL (ref 3.87–5.11)
RDW: 26.2 % — ABNORMAL HIGH (ref 11.5–15.5)
WBC: 8.7 10*3/uL (ref 4.0–10.5)
nRBC: 0 % (ref 0.0–0.2)

## 2020-11-21 LAB — PREALBUMIN: Prealbumin: 15.1 mg/dL — ABNORMAL LOW (ref 18–38)

## 2020-11-21 LAB — TRIGLYCERIDES: Triglycerides: 107 mg/dL (ref <150)

## 2020-11-21 LAB — MAGNESIUM: Magnesium: 2.2 mg/dL (ref 1.7–2.4)

## 2020-11-21 LAB — PHOSPHORUS: Phosphorus: 2.6 mg/dL (ref 2.5–4.6)

## 2020-11-21 MED ORDER — INSULIN ASPART 100 UNIT/ML IJ SOLN
0.0000 [IU] | Freq: Three times a day (TID) | INTRAMUSCULAR | Status: DC
  Administered 2020-11-21 – 2020-11-22 (×2): 1 [IU] via SUBCUTANEOUS

## 2020-11-21 MED ORDER — BARIUM SULFATE 2.1 % PO SUSP
450.0000 mL | Freq: Two times a day (BID) | ORAL | Status: DC
  Administered 2020-11-21: 450 mL via ORAL

## 2020-11-21 MED ORDER — PROSOURCE PLUS PO LIQD
30.0000 mL | Freq: Two times a day (BID) | ORAL | Status: DC
  Administered 2020-11-22 – 2020-11-23 (×2): 30 mL via ORAL
  Filled 2020-11-21 (×4): qty 30

## 2020-11-21 MED ORDER — TRAVASOL 10 % IV SOLN
INTRAVENOUS | Status: AC
  Filled 2020-11-21: qty 900

## 2020-11-21 MED ORDER — IOHEXOL 9 MG/ML PO SOLN
500.0000 mL | ORAL | Status: AC
  Administered 2020-11-21: 500 mL via ORAL

## 2020-11-21 MED ORDER — INSULIN ASPART 100 UNIT/ML IJ SOLN
0.0000 [IU] | Freq: Three times a day (TID) | INTRAMUSCULAR | Status: DC
  Administered 2020-11-21: 2 [IU] via SUBCUTANEOUS

## 2020-11-21 MED ORDER — RESOURCE INSTANT PROTEIN PO PWD PACKET
1.0000 | Freq: Three times a day (TID) | ORAL | Status: DC
  Filled 2020-11-21 (×10): qty 6

## 2020-11-21 NOTE — Progress Notes (Signed)
[facility] Progress Note  Patient: Robin [name]    LKG:[id number]  DOA: 10/26/2020     Date of Service: the patient was seen and examined on 11/21/2020  Brief hospital course: Past medical history of paroxysmal A. fib on Eliquis, HTN.  Presents with complaints of anemia found to have colonic neoplasm.  Underwent laparoscopic hemicolectomy and reanastomosis on 5/16.  Postop developed ileus requiring prolonged TPN. Developed fever on 5/26 concerning for pneumatosis based on the work-up and started on broad-spectrum IV antibiotics. Currently plan is monitor for improvement.  Assessment and Plan: 1.  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  Biopsy was positive for invasive adenocarcinoma. SP laparoscopic proximal hemicolectomy with reanastomosis by Dr. Elinor Parkinson on 5/16. Surgical pathology with negative margins. Patient will require outpatient oncology follow-up. Management per surgery.  2.  Postop ileus Now frequent diarrhea Developed prolonged postop ileus requiring NG tube insertion as well as TPN. Ileus continues remain a problem.  Repeat x-ray on 6/3 continues to showed ileus. Continues to have poor p.o. intake as well. Surgery recommending clear liquid diet. Continuing current medication. TPN reduced on 6/1.  Based on the discussion with general surgery, back to full strength on 6/5. Repeat CT abdomen ordered on 6/6.  Currently reading pending.  Management per surgery.  3.  Concern for pneumatosis Aspiration pneumonia 5/26 developed fever with tachycardia and leukocytosis. CT abdomen concerning for small bowel dilation and gastric distention as well as pneumatosis along the duodenum and portal venous gas. NG tube replaced patient was started on IV Zosyn antibiotics this. No further antibiotic needed per surgery. Completed course for aspiration pneumonia.  4.  Acute on chronic GI bleeding. Iron  deficiency anemia FOBT was positive. Hemoglobin stable for now. Continue replacing iron and B12.  5.  Paroxysmal A. fib with RVR Rate currently controlled. On Toprol-XL, changing from twice daily to daily to minimize pill burden. Continue Cardizem and Eliquis. Monitor.  6.  Essential hypertension Blood pressure stable. Continue current regimen.  7.  Acute on chronic diastolic CHF Volume overload from TPN.  Intermittently requiring Lasix. Echocardiogram shows preserved EF without any wall motion abnormality. Monitor.  8.  Hypokalemia, hypophosphatemia, hypomagnesemia. Currently being replaced.  Monitor.  9.  Diarrhea. Patient had multiple bouts of diarrhea in last 24 hours. Surgery recommended no intervention for now as it will worsen her ileus.  Anticipating improvement on its own in 24 hours. Monitor electrolyte. No significant abdominal pain and abdomen actually appears soft therefore no concern for infectious etiology.  Body mass index is 24.37 kg/m.  Nutrition Problem: Increased nutrient needs Etiology: acute illness Interventions: Interventions: TPN  9.  Stage II right buttock pressure ulcer. POA status not clear. Continue home dressing. Pressure Injury 11/14/20 Buttocks Right Stage 2 -  Partial thickness loss of dermis presenting as a shallow open injury with a red, pink wound bed without slough. (Active)  11/14/20 2230  Location: Buttocks  Location Orientation: Right  Staging: Stage 2 -  Partial thickness loss of dermis presenting as a shallow open injury with a red, pink wound bed without slough.  Wound Description (Comments):   Present on Admission:     Diet: Clear liquid diet DVT Prophylaxis:   Place TED hose Start: 11/19/20 1210 SCDs Start: 10/26/20 2337 apixaban (ELIQUIS) tablet 5 mg    Advance goals of care discussion: Full code  Family Communication: family was present at bedside, at the time of interview.  Disposition:  Status is:  Inpatient  Remains inpatient appropriate because:IV treatments appropriate due to intensity of illness or inability to take PO  Dispo: The patient is from: Home              Anticipated d/c is to: SNF              Patient currently is not medically stable to d/c.   Difficult to place patient No  Subjective: Passing gas.  No diarrhea.  Abdomen soft.  No nausea no vomiting.  No shortness of breath.  Physical Exam: General: Appear in mild distress, no Rash; Oral Mucosa Clear, moist. no Abnormal Neck Mass Or lumps, Conjunctiva normal  Cardiovascular: S1 and S2 Present, no Murmur, Respiratory: good respiratory effort, Bilateral Air entry present and CTA, no Crackles, no wheezes Abdomen: Bowel Sound present, improving soft and distended, no tenderness Extremities: no Pedal edema Neurology: alert and oriented to time, place, and person affect appropriate. no new focal deficit Gait not checked due to patient safety concerns  Vitals:   11/20/20 0459 11/20/20 2004 11/21/20 0450 11/21/20 1332  BP: 128/62 (!) 140/59 119/63 (!) 152/80  Pulse: 82 74 68 89  Resp: 16 18 15 18   Temp: (!) 97.4 F (36.3 C) 97.6 F (36.4 C) (!) 97.5 F (36.4 C) (!) 97.4 F (36.3 C)  TempSrc: Oral Oral Oral Oral  SpO2: 98% 100% 98% 100%  Weight:      Height:        Intake/Output Summary (Last 24 hours) at 11/21/2020 1854 Last data filed at 11/21/2020 1848 Gross per 24 hour  Intake 1530.1 ml  Output --  Net 1530.1 ml   Filed Weights   11/15/20 0500 11/16/20 0609 11/18/20 0642  Weight: 61.3 kg 64.5 kg 64.4 kg    Data Reviewed: I have personally reviewed and interpreted daily labs, tele strips, imaging. I reviewed all nursing notes, pharmacy notes, vitals, pertinent old records I have discussed plan of care as described above with RN and patient/family.  CBC: Recent Labs  Lab 11/16/20 1320 11/17/20 0312 11/18/20 0239 11/18/20 1151 11/19/20 0315 11/21/20 0314  WBC 12.0* 9.4  --  10.9* 10.2 8.7   NEUTROABS  --   --   --   --   --  6.7  HGB 9.8* 9.0* 9.6* 10.1* 9.4* 9.5*  HCT 33.6* 29.8*  --  34.1* 32.2* 32.5*  MCV 79.8* 79.9*  --  82.2 82.1 82.9  PLT 434* 389  --  447* 414* 188   Basic Metabolic Panel: Recent Labs  Lab 11/15/20 0417 11/16/20 0910 11/17/20 0312 11/18/20 1350 11/19/20 0315 11/20/20 0304 11/21/20 0314  NA 133* 134* 135 137 138 138 138  K 3.5 4.0 3.9 4.0 3.8 4.1 3.9  CL 101 100 103 103 105 105 106  CO2 28 27 28 27 26 27 27   GLUCOSE 148* 157* 126* 152* 132* 128* 137*  BUN 19 20 22 19 18 21 23   CREATININE 0.46 0.46 0.47 0.55 0.48 0.53 0.47  CALCIUM 8.5* 8.7* 8.9 9.4 9.4 9.5 9.2  MG 2.0  --  2.0  --  1.9 2.3 2.2  PHOS 2.2* 2.5 2.7  --  2.8  --  2.6    Studies: DG Abd 2 Views  Result Date: 11/21/2020 CLINICAL DATA:  Small-bowel obstruction EXAM: ABDOMEN - 2 VIEW COMPARISON:  November 18, 2020 FINDINGS: Supine and upright images were obtained. There remain multiple loops of dilated small bowel with occasional air-fluid levels. No free  air appreciable. Surgical clips in gallbladder fossa region. Lower lung regions are clear. IMPRESSION: Multiple loops of dilated small bowel with occasional air-fluid levels. Differential considerations include bowel obstruction and ileus. No evident free air. Visualized lower lung regions clear. Electronically Signed   By: Lowella Grip III M.D.   On: 11/21/2020 08:02    Scheduled Meds: . (feeding supplement) PROSource Plus  30 mL Oral BID BM  . apixaban  5 mg Oral BID  . Barium Sulfate  450 mL Oral BID  . Chlorhexidine Gluconate Cloth  6 each Topical Daily  . diltiazem  180 mg Oral Daily  . famotidine  20 mg Oral Daily  . feeding supplement  1 Container Oral TID BM  . ferrous sulfate  325 mg Oral Q breakfast  . insulin aspart  0-9 Units Subcutaneous Q8H  . lactobacillus acidophilus & bulgar  1 tablet Oral TID WC  . lip balm  1 application Topical BID  . liver oil-zinc oxide   Topical BID  . metoprolol succinate  100 mg  Oral Daily  . protein supplement  1 Scoop Oral TID WC  . simethicone  40 mg Oral QID  . sodium chloride flush  10-40 mL Intracatheter Q12H   Continuous Infusions: . methocarbamol (ROBAXIN) IV    . TPN ADULT (ION) 75 mL/hr at 11/21/20 1818   PRN Meds: acetaminophen, acetaminophen, alum & mag hydroxide-simeth, artificial tears, bisacodyl, fentaNYL (SUBLIMAZE) injection, LORazepam, magic mouthwash, menthol-cetylpyridinium, methocarbamol (ROBAXIN) IV, metoprolol tartrate, ondansetron **OR** ondansetron (ZOFRAN) IV, phenol, prochlorperazine **OR** prochlorperazine, sodium chloride flush, traMADol  Time spent: 35 minutes  Author: Berle Mull, MD [facility] 11/21/2020 6:54 PM  To reach On-call, see care teams to locate the attending and reach out via [email or web address]. Between 7PM-7AM, please contact night-coverage If you still have difficulty reaching the attending provider, please page the [facility] (Director on Call) for [facility] on amion for assistance.

## 2020-11-21 NOTE — Progress Notes (Addendum)
CH received referral from dtr. [name] via page from [facility] spiritual care dept and later received spiritual care consult from RN requesting prayer.  When [facility] arrived, pt. sitting in chair at bedside drinking contrast liquid for upcoming CT scan in afternoon.  Pt. shared she underwent GI surgery approx. 3wks ago and that she has not healed from this procedure as well as had been hoped; she has not been able to eat since surgery and CT scan today is to help diagnose what the problem is, pt. shared.  Pt. says she lives alone after the loss of her second husband this past November; dtr. sharon has been a source of support, along w/pt.'s granddaughter.  Pt. requested prayer for God's presence and providence going into the scan this afternoon; she says she does not want to have to undergo a second surgery: "I just don't know if I could take that" she shared.  CH provided supportive presence and prayer; pt. requests follow-up visits if possible.  Lindaann Pascal PRN Chaplain Pager: [phone]

## 2020-11-21 NOTE — Progress Notes (Signed)
Nutrition Follow-up  INTERVENTION:   -Will order 30 ml Prosource Plus BID, each supplement provides 100 kcal and 15 grams protein.   -Beneprotein TID with each meals, each provides 25 kcals and 6g protein  -Boost Breeze po TID, each supplement provides 250 kcal and 9 grams of protein  -TPN management per Pharmacy  NUTRITION DIAGNOSIS:   Increased nutrient needs related to acute illness as evidenced by estimated needs.  Ongoing.  GOAL:   Patient will meet greater than or equal to 90% of their needs   MONITOR:   Diet advancement,Labs,Weight trends,I & O's,Other (Comment) (TPN regimen)  ASSESSMENT:   86 y.o. female with medical history of A.Fib on Eliquis and HTN. She presented to the ED for evaluation of anemia and SOB which has been ongoing since 06/2020.  5/16: s/p lap assisted right hemicolectomy for ascending colon mass   Patient's diet was downgraded to clears.  Pt continues to have nausea and abdominal distention. Very poor appetite. Pt was not consuming much PO prior to the weekend.  Supplement was switched to [employer] (last accepted 6/4). Will add Prosource supplements and Beneprotein.  TPN has been increased back to goal rate of 75 ml/hr (providing 1818 kcals and 90g protein).   Admission weight: 145 lbs. Current weight: 141 lbs.  Medications: Ferrous sulfate, Lactinex Labs reviewed: CBGs: 119-167  Diet Order:   Diet Order            Diet clear liquid Room service appropriate? Yes; Fluid consistency: Thin  Diet effective now                 EDUCATION NEEDS:   No education needs have been identified at this time  Skin:  Skin Assessment: Skin Integrity Issues: Skin Integrity Issues:: Stage II Stage II: right buttocks  Last BM:  6/2  Height:   Ht Readings from Last 1 Encounters:  11/05/20 5\' 4"  (1.626 m)    Weight:   Wt Readings from Last 1 Encounters:  11/18/20 64.4 kg   BMI:  Body mass index is 24.37 kg/m.  Estimated Nutritional  Needs:   Kcal:  1700-1900 kcal  Protein:  80-95 grams  Fluid:  >/= 2 L/day  Clayton Bibles, MS, RD, LDN Inpatient Clinical Dietitian Contact information available via Amion

## 2020-11-21 NOTE — Progress Notes (Signed)
PHARMACY - TOTAL PARENTERAL NUTRITION CONSULT NOTE   Indication: Small bowel obstruction; anticipate prolonged NPO status  Patient Measurements: Height: 5' 4" (162.6 cm) Weight: 64.4 kg (141 lb 15.6 oz) IBW/kg (Calculated) : 54.7 TPN AdjBW (KG): 65.4 Body mass index is 24.37 kg/m. Usual Weight: 66 kg (~10 lb weight loss this admission)  Assessment:  42 yoF with PMH Afib admitted 5/11 with anemia; found to have bleeding colonic mass suspicious for malignancy and underwent hemicolectomy 5/16. Developed ileus POD3 which initially improved, then worsened POD10. CT showed massive SBO and no immediate indication for surgery, so Pharmacy consulted to begin TPN for expected prolonged ileus.  Glucose / Insulin: no Hx DM; CBGs within goal. Electrolytes: all WNL Renal: SCr, BUN, bicarb stable WNL; UOP not fully charted Hepatic: 5/30 Tbili improved to WNL; LFTs WNL except Alk Phos slightly elevated and albumin low/stable; TG stable WNL on 5/30 Prealbumin: low; minimally improved (5.7 >> 6.1) since starting TPN I/O: d/c NGT 5/29 - dCHF and diuresing since 5/29, no MIVF - still with diarrhea but CCS note, BMs appear to be becoming more formed GI Imaging: - 5/13 colonoscopy: multiple polyps; larger masses at cecum and transverse colon - 5/14 MRI liver: no metastatic dz - 5/18 AXR: dilated SB loops; possible ileus - 5/26 CTa/p: severely dilated SB loops (not including ileocolonic anastomosis) concerning for distal SBO, also intramural and intrahepatic portal venous gas suggesting possible ischemic bowel - 5/31 mildly distended bowel loops: SBO vs ileus Surgeries / Procedures:  - 5/16 laparoscopic hemicolectomy/cecectomy; omentopexy of ileocolonic anastomosis  Central access: PICC placed 5/27 TPN start date: 5/27  Nutritional Goals RD recs 5/27 Kcal:  1700-1900 kcal Protein:  80-95 grams Fluid:  >/= 2 L/day  TPN at goal rate of 75 mL/hr provides 90 g of protein and 1818 kcals per  day  Current Nutrition:  Back to clear liquid diet due to poor PO intake TPN at 75 ml/hr  Plan: At 1800:  Continue TPN back to full rate at 75 ml/hr  Electrolytes in TPN  Na - 75 mEq/L  K - 40 mEq/L  Ca - 5 mEq/L  Mg - 5 mEq/L  Phos - 12 mmol/L  Cl:Ac ratio 1:1  Add standard MVI and trace elements to TPN  Add back Q8h sensitive SSI  MIVF per primary  Monitor TPN labs on Mon/Thurs  Follow up improvement in oral diet   Elenor Quinones, PharmD, BCPS, BCIDP Clinical Pharmacist 11/21/2020 7:43 AM

## 2020-11-21 NOTE — Progress Notes (Signed)
Progress Note  21 Days Post-Op  Subjective: Patient is having diarrhea - per RN green in color with flecks of material present. Patient denies significant abdominal pain. She is still intermittently nauseated with abdominal distention and poor appetite. Last CT 11 days ago.   Objective: Vital signs in last 24 hours: Temp:  [97.5 F (36.4 C)-97.6 F (36.4 C)] 97.5 F (36.4 C) (06/06 0450) Pulse Rate:  [68-74] 68 (06/06 0450) Resp:  [15-18] 15 (06/06 0450) BP: (119-140)/(59-63) 119/63 (06/06 0450) SpO2:  [98 %-100 %] 98 % (06/06 0450) Last BM Date: 11/20/20  Intake/Output from previous day: 06/05 0701 - 06/06 0700 In: 1740.1 [P.O.:700; I.V.:1040.1] Out: 0  Intake/Output this shift: No intake/output data recorded.  PE: General: pleasant, WD, elderly female who is laying in bed in NAD Heart: regular, rate, and rhythm.  Lungs:  Respiratory effort nonlabored Abd: soft, NT, mildly distended, +BS, incisions c/d/i Psych: A&Ox3 with an appropriate affect.    Lab Results:  Recent Labs    11/19/20 0315 11/21/20 0314  WBC 10.2 8.7  HGB 9.4* 9.5*  HCT 32.2* 32.5*  PLT 414* 387   BMET Recent Labs    11/20/20 0304 11/21/20 0314  NA 138 138  K 4.1 3.9  CL 105 106  CO2 27 27  GLUCOSE 128* 137*  BUN 21 23  CREATININE 0.53 0.47  CALCIUM 9.5 9.2   PT/INR No results for input(s): LABPROT, INR in the last 72 hours. CMP     Component Value Date/Time   NA 138 11/21/2020 0314   NA 139 02/21/2018 0854   K 3.9 11/21/2020 0314   CL 106 11/21/2020 0314   CO2 27 11/21/2020 0314   GLUCOSE 137 (H) 11/21/2020 0314   BUN 23 11/21/2020 0314   BUN 20 02/21/2018 0854   CREATININE 0.47 11/21/2020 0314   CREATININE 0.54 (L) 11/28/2015 1422   CALCIUM 9.2 11/21/2020 0314   PROT 5.5 (L) 11/21/2020 0314   ALBUMIN 2.4 (L) 11/21/2020 0314   AST 36 11/21/2020 0314   ALT 37 11/21/2020 0314   ALKPHOS 240 (H) 11/21/2020 0314   BILITOT 0.7 11/21/2020 0314   GFRNONAA >60 11/21/2020  0314   GFRAA 78 02/21/2018 0854   Lipase     Component Value Date/Time   LIPASE 31 10/26/2020 1919       Studies/Results: DG Abd 2 Views  Result Date: 11/21/2020 CLINICAL DATA:  Small-bowel obstruction EXAM: ABDOMEN - 2 VIEW COMPARISON:  November 18, 2020 FINDINGS: Supine and upright images were obtained. There remain multiple loops of dilated small bowel with occasional air-fluid levels. No free air appreciable. Surgical clips in gallbladder fossa region. Lower lung regions are clear. IMPRESSION: Multiple loops of dilated small bowel with occasional air-fluid levels. Differential considerations include bowel obstruction and ileus. No evident free air. Visualized lower lung regions clear. Electronically Signed   By: Lowella Grip III M.D.   On: 11/21/2020 08:02    Anti-infectives: Anti-infectives (From admission, onward)   Start     Dose/Rate Route Frequency Ordered Stop   11/16/20 1200  Ampicillin-Sulbactam (UNASYN) 3 g in sodium chloride 0.9 % 100 mL IVPB        3 g 200 mL/hr over 30 Minutes Intravenous Every 6 hours 11/16/20 0755 11/17/20 0840   11/15/20 1200  Ampicillin-Sulbactam (UNASYN) 3 g in sodium chloride 0.9 % 100 mL IVPB  Status:  Discontinued        3 g 200 mL/hr over 30 Minutes Intravenous Every 6 hours  11/15/20 1055 11/16/20 0755   11/11/20 1800  anidulafungin (ERAXIS) 100 mg in sodium chloride 0.9 % 100 mL IVPB  Status:  Discontinued       "Followed by" Linked Group Details   100 mg 78 mL/hr over 100 Minutes Intravenous Every 24 hours 11/10/20 1622 11/15/20 0815   11/10/20 2200  piperacillin-tazobactam (ZOSYN) IVPB 3.375 g  Status:  Discontinued        3.375 g 12.5 mL/hr over 240 Minutes Intravenous Every 8 hours 11/10/20 1610 11/15/20 1035   11/10/20 1800  anidulafungin (ERAXIS) 200 mg in sodium chloride 0.9 % 200 mL IVPB       "Followed by" Linked Group Details   200 mg 78 mL/hr over 200 Minutes Intravenous  Once 11/10/20 1622 11/11/20 0016   11/10/20 1700   anidulafungin (ERAXIS) 100 mg in sodium chloride 0.9 % 100 mL IVPB  Status:  Discontinued        100 mg 78 mL/hr over 100 Minutes Intravenous Every 24 hours 11/10/20 1610 11/10/20 1621   11/10/20 1700  piperacillin-tazobactam (ZOSYN) IVPB 3.375 g        3.375 g 100 mL/hr over 30 Minutes Intravenous  Once 11/10/20 1618 11/10/20 1720   11/08/20 0045  vancomycin (VANCOCIN) 50 mg/mL oral solution 125 mg  Status:  Discontinued        125 mg Oral Every 6 hours 11/07/20 2354 11/08/20 1304   10/31/20 2200  cefoTEtan (CEFOTAN) 2 g in sodium chloride 0.9 % 100 mL IVPB        2 g 200 mL/hr over 30 Minutes Intravenous Every 12 hours 10/31/20 1744 10/31/20 2205   10/31/20 0915  cefoTEtan (CEFOTAN) 2 g in sodium chloride 0.9 % 100 mL IVPB        2 g 200 mL/hr over 30 Minutes Intravenous On call to O.R. 10/31/20 0824 10/31/20 1312       Assessment/Plan POD#21 s/p lap assisted right hemicolectomy for ascending colon mass (adenocarcinoma) and multiple polyps (likely pre-malignant), Dr. Johney Maine 10/31/20 -patient with worsened distention and nausea over the weekend although still having diarrhea - film this AM with possible ileus - TPN put back to full rate - C. Diff negative 5/23, no leukocytosis  - check CT AP with PO contrast today   Acute blood loss anemiain setting of chronic GI losses from cancer.Hgb 9.5, stable  FEN -CLD, TPN  VTE -Eliquis ID -Cefotetanpre-op, PO vancomycin 5/24>5/24, zosyn 5/26>5/31, eraxis 5/26>5/31, unasyn 5/31> 6/1 (for PNA)  - per TRH -  Atrial fibrillation-stable backonhome medstoprol,cardizem, eliquis HTN HLD Diarrhea - c diffnegative5/23, no leukocytosis Severe malnutrition - prealbumin 5.7>>6.1>>15.1 today, continue TPN for now but hopefully stop soon if CT negative and diarrhea improving  Possible aspiration PNA - completed unasyn  LOS: 25 days    Norm Parcel, [facility] Surgery 11/21/2020, 9:34 AM Please see Amion for pager  number during day hours 7:00am-4:30pm

## 2020-11-21 NOTE — Progress Notes (Signed)
Physical Therapy Treatment Patient Details Name: Robin [name] MRN: [id number] DOB: December 12, 1934 Today's Date: 11/21/2020    History of Present Illness 86 year old F presented to ED for anemia and shortness of breath, and admitted for blood loss anemia due to GI bleed on 10/26/20.  Hgb 8.5 (reportedly 11 in January 2022 per daughter).  Hemoccult positive.  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 s/p laparoscopic hemicolectomy 10/31/20. Pt  with PMH of A. fib on Eliquis and HTN. Hospitilization complicated by ileus.    PT Comments    Pt making gradual progress.  Needs min A at times for balance and cues for safe ambulation.  Continue to recommend SNF as pt lives alone, fall risk, and requiring assist for balance and ADLS.  Treatment limited as pt reports she is going for CT scan at 1330.     Follow Up Recommendations  SNF     Equipment Recommendations  3in1 (PT)    Recommendations for Other Services       Precautions / Restrictions Precautions Precautions: Other (comment) Precaution Comments: abdominal surgery    Mobility  Bed Mobility               General bed mobility comments: OOb in chair    Transfers Overall transfer level: Needs assistance Equipment used: Rolling walker (2 wheeled) Transfers: Sit to/from Stand Sit to Stand: Min assist         General transfer comment: Min A to rise from bed and toilet. Cues for hand placement  Ambulation/Gait Ambulation/Gait assistance: Min guard Gait Distance (Feet): 200 Feet Assistive device: Rolling walker (2 wheeled) Gait Pattern/deviations: Step-through pattern;Decreased stride length     General Gait Details: Cued 2 x for RW proximity and posture   Stairs             Wheelchair Mobility    Modified Rankin (Stroke Patients Only)       Balance Overall balance assessment: Needs assistance Sitting-balance support:  No upper extremity supported Sitting balance-Leahy Scale: Good     Standing balance support: During functional activity Standing balance-Leahy Scale: Poor Standing balance comment: Reliant on RW or min guard.  TOielting ADLs in standing but PT assisting with balance                            Cognition Arousal/Alertness: Awake/alert Behavior During Therapy: WFL for tasks assessed/performed Overall Cognitive Status: Within Functional Limits for tasks assessed                                        Exercises      General Comments General comments (skin integrity, edema, etc.): VSS      Pertinent Vitals/Pain Pain Assessment: Faces Faces Pain Scale: Hurts a little bit Pain Intervention(s): Limited activity within patient's tolerance    Home Living                      Prior Function            PT Goals (current goals can now be found in the care plan section) Acute Rehab PT Goals Patient Stated Goal: to get stronger PT Goal Formulation: With patient Time For Goal Achievement: 12/05/20 Potential to Achieve Goals: Good Progress towards PT goals: Progressing toward goals    Frequency  Min 3X/week      PT Plan Current plan remains appropriate    Co-evaluation              AM-PAC PT "6 Clicks" Mobility   Outcome Measure  Help needed turning from your back to your side while in a flat bed without using bedrails?: A Little Help needed moving from lying on your back to sitting on the side of a flat bed without using bedrails?: A Little Help needed moving to and from a bed to a chair (including a wheelchair)?: A Little Help needed standing up from a chair using your arms (e.g., wheelchair or bedside chair)?: A Little Help needed to walk in hospital room?: A Little Help needed climbing 3-5 steps with a railing? : A Lot 6 Click Score: 17    End of Session   Activity Tolerance: Patient tolerated treatment well Patient  left: with call bell/phone within reach;with chair alarm set;in chair Nurse Communication: Mobility status PT Visit Diagnosis: Difficulty in walking, not elsewhere classified (R26.2)     Time: 5726-2035 PT Time Calculation (min) (ACUTE ONLY): 16 min  Charges:  $Gait Training: 8-22 mins                     Abran Richard, PT Acute Rehab Services Pager [phone] [facility] [place] 11/21/2020, 2:01 PM

## 2020-11-22 LAB — GLUCOSE, CAPILLARY
Glucose-Capillary: 127 mg/dL — ABNORMAL HIGH (ref 70–99)
Glucose-Capillary: 143 mg/dL — ABNORMAL HIGH (ref 70–99)

## 2020-11-22 MED ORDER — TRAVASOL 10 % IV SOLN
INTRAVENOUS | Status: AC
  Filled 2020-11-22: qty 900

## 2020-11-22 NOTE — Progress Notes (Signed)
PHARMACY - TOTAL PARENTERAL NUTRITION CONSULT NOTE   Indication: Small bowel obstruction; anticipate prolonged NPO status  Patient Measurements: Height: 5\' 4"  (162.6 cm) Weight: 62.9 kg (138 lb 10.7 oz) IBW/kg (Calculated) : 54.7 TPN AdjBW (KG): 65.4 Body mass index is 23.8 kg/m. Usual Weight: 66 kg (~10 lb weight loss this admission)  Assessment:  9 yoF with PMH Afib admitted 5/11 with anemia; found to have bleeding colonic mass suspicious for malignancy and underwent hemicolectomy 5/16. Developed ileus POD3 which initially improved, then worsened POD10. CT showed massive SBO and no immediate indication for surgery, so Pharmacy consulted to begin TPN for expected prolonged ileus.  Glucose / Insulin: no Hx DM; CBGs 127-171. Used 4 units sSSI on 6/6 Electrolytes: Na 138, K 3.9, CoCa 10.5, Mg 2.2, Phos 2.6 Renal: SCr, BUN, bicarb stable WNL; UOP not fully charted Hepatic: Tbili improved to WNL; LFTs WNL except Alk Phos elevated and albumin low/stable; TG stable WNL on 6/6 Prealbumin: low 15.7, improved since starting TPN I/O: d/c NGT 5/29 - dCHF and diuresing since 5/29, no MIVF - still with diarrhea but CCS note, BMs appear to be becoming more formed GI Imaging: - 5/13 colonoscopy: multiple polyps; larger masses at cecum and transverse colon - 5/14 MRI liver: no metastatic dz - 5/18 AXR: dilated SB loops; possible ileus - 5/26 CTa/p: severely dilated SB loops (not including ileocolonic anastomosis) concerning for distal SBO, also intramural and intrahepatic portal venous gas suggesting possible ischemic bowel - 5/31 mildly distended bowel loops: SBO vs ileus - 6/6 CT A/P: small bowel dilated but no evidence of SBO, prev small bowel pneumatosis resolved Surgeries / Procedures:  - 5/16 laparoscopic hemicolectomy/cecectomy; omentopexy of ileocolonic anastomosis  Central access: PICC placed 5/27 TPN start date: 5/27  Nutritional Goals RD recs 5/27 Kcal:  1700-1900 kcal Protein:   80-95 grams Fluid:  >/= 2 L/day  TPN at goal rate of 75 mL/hr provides 90 g of protein and 1818 kcals per day  Current Nutrition:  Soft diet starting 6/7 TPN - begin cycle 6/7  Plan:  Begin cycle TPN at 1800 over 18 hours  Electrolytes in TPN  Na - 75 mEq/L  K - 45 mEq/L  Ca - 0 mEq/L  Mg - 5 mEq/L  Phos - 12 mmol/L  Cl:Ac ratio 1:1  Add standard MVI and trace elements to TPN  Remove SSI  MIVF per primary  F/u BMET, Mg, Phos 6/8  Monitor TPN labs on Mon/Thurs  F/u PO intake   Dimple Nanas, PharmD PGY-1 Acute Care Pharmacy Resident 11/22/2020 7:27 AM

## 2020-11-22 NOTE — TOC Progression Note (Signed)
Transition of Care [facility]) - Progression Note   Patient Details  Name: Robin [name] MRN: [id number] Date of Birth: 1935/03/11  Transition of Care [facility]) CM/SW [place], LCSW Phone Number: 11/22/2020, 11:38 AM  Clinical Narrative: Patient not medically stable for SNF at this time. CSW left VM for [name] with [name] to update her regarding patient not being ready for rehab at this time. TOC to follow.  Expected Discharge Plan: Skilled Nursing Facility Barriers to Discharge: Continued Medical Work up  Expected Discharge Plan and Services Expected Discharge Plan: [place] Discharge Planning Services: CM Consult Living arrangements for the past 2 months: Single Family Home  Readmission Risk Interventions Readmission Risk Prevention Plan 11/17/2020  Transportation Screening Complete  HRI or Home Care Consult Complete  Social Work Consult for [place] Planning/Counseling Complete  Palliative Care Screening Not Applicable  Medication Review [employer] photographer) Complete  Some recent data might be hidden

## 2020-11-22 NOTE — Progress Notes (Signed)
Progress Note  22 Days Post-Op  Subjective: Patient is less diarrhea today and states it has only occurred when trying to pass flatus on the toilet.  No nausea except some at night when she lies down.  Walking a lot.  CT scan reviewed with her and her daughter on speaker phone.  Objective: Vital signs in last 24 hours: Temp:  [97.4 F (36.3 C)-98 F (36.7 C)] 98 F (36.7 C) (06/07 0528) Pulse Rate:  [75-89] 81 (06/07 0528) Resp:  [18] 18 (06/07 0528) BP: (131-152)/(74-90) 131/74 (06/07 0528) SpO2:  [96 %-100 %] 100 % (06/07 0528) Weight:  [62.9 kg] 62.9 kg (06/07 0500) Last BM Date: 11/22/20  Intake/Output from previous day: 06/06 0701 - 06/07 0700 In: 3642.1 [P.O.:1940; I.V.:1702.1] Out: -  Intake/Output this shift: Total I/O In: 120 [P.O.:120] Out: -   PE: Abd: soft, NT, some distention, +BS, incisions c/d/i   Lab Results:  Recent Labs    11/21/20 0314  WBC 8.7  HGB 9.5*  HCT 32.5*  PLT 387   BMET Recent Labs    11/20/20 0304 11/21/20 0314  NA 138 138  K 4.1 3.9  CL 105 106  CO2 27 27  GLUCOSE 128* 137*  BUN 21 23  CREATININE 0.53 0.47  CALCIUM 9.5 9.2   PT/INR No results for input(s): LABPROT, INR in the last 72 hours. CMP     Component Value Date/Time   NA 138 11/21/2020 0314   NA 139 02/21/2018 0854   K 3.9 11/21/2020 0314   CL 106 11/21/2020 0314   CO2 27 11/21/2020 0314   GLUCOSE 137 (H) 11/21/2020 0314   BUN 23 11/21/2020 0314   BUN 20 02/21/2018 0854   CREATININE 0.47 11/21/2020 0314   CREATININE 0.54 (L) 11/28/2015 1422   CALCIUM 9.2 11/21/2020 0314   PROT 5.5 (L) 11/21/2020 0314   ALBUMIN 2.4 (L) 11/21/2020 0314   AST 36 11/21/2020 0314   ALT 37 11/21/2020 0314   ALKPHOS 240 (H) 11/21/2020 0314   BILITOT 0.7 11/21/2020 0314   GFRNONAA >60 11/21/2020 0314   GFRAA 78 02/21/2018 0854   Lipase     Component Value Date/Time   LIPASE 31 10/26/2020 1919       Studies/Results: CT ABDOMEN PELVIS WO CONTRAST  Result  Date: 11/21/2020 CLINICAL DATA:  Bowel obstruction suspected. Ileus. Laparoscopic hemicolectomy 10/31/2020. EXAM: CT ABDOMEN AND PELVIS WITHOUT CONTRAST TECHNIQUE: Multidetector CT imaging of the abdomen and pelvis was performed following the standard protocol without IV contrast. COMPARISON:  Postoperative CT 11/10/2020. Preoperative CT 10/28/2020 FINDINGS: Lower chest: Resolved left basilar opacity from prior exam. Similar appearing reticulation and nodularity in the anterior right lower and right middle lobes appear to represent scarring. Hepatobiliary: No evidence of focal liver abnormality on this noncontrast exam. Previous portal venous gas has resolved. Cholecystectomy. No definite biliary dilatation, common bile duct not well visualized. Pancreas: Mild parenchymal atrophy. No ductal dilatation or inflammation. Spleen: Normal in size without focal abnormality. Adrenals/Urinary Tract: No adrenal nodule. No hydronephrosis or perinephric edema. Left peripelvic and right renal cortical cyst. The urinary bladder is near completely empty and not well assessed. Stomach/Bowel: No abnormal gastric distension. Normal positioning of the duodenum and ligament of Treitz. Status post right hemicolectomy with enteric sutures in the mid transverse colon. Small bowel loops are diffusely dilated and contrast filled, however enteric contrast reaches the rectosigmoid colon, no obstruction occasional areas of small bowel thickening, including in the right lower quadrant adjacent to an area  of presumed fat necrosis, for example coronal series 4, image 34. the previous small bowel pneumatosis is no longer seen. Vascular/Lymphatic: Aortic atherosclerosis. No aortic aneurysm. There is no portal venous or mesenteric gas. No bulky abdominopelvic adenopathy. Reproductive: Uterus is surgically absent. There is no adnexal mass. Other: There is a rounded area of fatty inflammation in the pelvis just to the right of midline with small amount  of free fluid suspicious for fat necrosis. This spans approximately 6.2 cm, series 2, image 64. This is more coalescent than seen on prior exam. There is a small volume of free fluid in the posterior pelvis but no loculated collection. No free air. Minimal subcutaneous edema. Musculoskeletal: Chronic T12, L2 and minimal L3 superior endplate compression fractures. Multilevel degenerative change in the spine. No acute osseous abnormalities are seen. IMPRESSION: 1. Post right hemicolectomy with enteric sutures in the mid transverse colon. Small bowel is dilated, however no evidence of obstruction with enteric contrast reaching the rectosigmoid colon. Previous small bowel pneumatosis and portal venous gas has resolved. No evidence of bowel obstruction. 2. Occasional areas of small bowel thickening in the pelvis adjacent to presumed fat necrosis described below. 3. A rounded area of fatty inflammation in the pelvis just to the right of midline with small amount of free fluid suspicious for fat necrosis. This is more coalescent than on prior exam. 4. Small volume of free fluid in the posterior pelvis but no loculated collection/abscess. No free air. Aortic Atherosclerosis (ICD10-I70.0). Electronically Signed   By: Keith Rake M.D.   On: 11/21/2020 19:32   DG Abd 2 Views  Result Date: 11/21/2020 CLINICAL DATA:  Small-bowel obstruction EXAM: ABDOMEN - 2 VIEW COMPARISON:  November 18, 2020 FINDINGS: Supine and upright images were obtained. There remain multiple loops of dilated small bowel with occasional air-fluid levels. No free air appreciable. Surgical clips in gallbladder fossa region. Lower lung regions are clear. IMPRESSION: Multiple loops of dilated small bowel with occasional air-fluid levels. Differential considerations include bowel obstruction and ileus. No evident free air. Visualized lower lung regions clear. Electronically Signed   By: Lowella Grip III M.D.   On: 11/21/2020 08:02     Anti-infectives: Anti-infectives (From admission, onward)   Start     Dose/Rate Route Frequency Ordered Stop   11/16/20 1200  Ampicillin-Sulbactam (UNASYN) 3 g in sodium chloride 0.9 % 100 mL IVPB        3 g 200 mL/hr over 30 Minutes Intravenous Every 6 hours 11/16/20 0755 11/17/20 0840   11/15/20 1200  Ampicillin-Sulbactam (UNASYN) 3 g in sodium chloride 0.9 % 100 mL IVPB  Status:  Discontinued        3 g 200 mL/hr over 30 Minutes Intravenous Every 6 hours 11/15/20 1055 11/16/20 0755   11/11/20 1800  anidulafungin (ERAXIS) 100 mg in sodium chloride 0.9 % 100 mL IVPB  Status:  Discontinued       "Followed by" Linked Group Details   100 mg 78 mL/hr over 100 Minutes Intravenous Every 24 hours 11/10/20 1622 11/15/20 0815   11/10/20 2200  piperacillin-tazobactam (ZOSYN) IVPB 3.375 g  Status:  Discontinued        3.375 g 12.5 mL/hr over 240 Minutes Intravenous Every 8 hours 11/10/20 1610 11/15/20 1035   11/10/20 1800  anidulafungin (ERAXIS) 200 mg in sodium chloride 0.9 % 200 mL IVPB       "Followed by" Linked Group Details   200 mg 78 mL/hr over 200 Minutes Intravenous  Once 11/10/20 1622  11/11/20 0016   11/10/20 1700  anidulafungin (ERAXIS) 100 mg in sodium chloride 0.9 % 100 mL IVPB  Status:  Discontinued        100 mg 78 mL/hr over 100 Minutes Intravenous Every 24 hours 11/10/20 1610 11/10/20 1621   11/10/20 1700  piperacillin-tazobactam (ZOSYN) IVPB 3.375 g        3.375 g 100 mL/hr over 30 Minutes Intravenous  Once 11/10/20 1618 11/10/20 1720   11/08/20 0045  vancomycin (VANCOCIN) 50 mg/mL oral solution 125 mg  Status:  Discontinued        125 mg Oral Every 6 hours 11/07/20 2354 11/08/20 1304   10/31/20 2200  cefoTEtan (CEFOTAN) 2 g in sodium chloride 0.9 % 100 mL IVPB        2 g 200 mL/hr over 30 Minutes Intravenous Every 12 hours 10/31/20 1744 10/31/20 2205   10/31/20 0915  cefoTEtan (CEFOTAN) 2 g in sodium chloride 0.9 % 100 mL IVPB        2 g 200 mL/hr over 30 Minutes  Intravenous On call to O.R. 10/31/20 0824 10/31/20 1312       Assessment/Plan POD#22 s/p lap assisted right hemicolectomy for ascending colon mass (adenocarcinoma) and multiple polyps (likely pre-malignant), Dr. Johney Maine 10/31/20 -CT scan reveals contrast in the colon by the time of the scan suggesting her gut is working, but her bowels remain distended.  No nausea with CLD -try soft diet again -CT with some fat necrosis in the pelvis, but no other complications or abscesses noted. - TPN put back to full rate - C. Diff negative 5/23, no leukocytosis  -all of this discussed with patient and daughter on the phone -cont mobilization/IS/pulm toilet -if diarrhea worsens with eating may try low dose lomotil or imodium.  Don't want to give too much given her pre-existing dilatation.   Acute blood loss anemiain setting of chronic GI losses from cancer.Hgb 9.5, stable  FEN -soft, TPN  VTE -Eliquis ID -Cefotetanpre-op, PO vancomycin 5/24>5/24, zosyn 5/26>5/31, eraxis 5/26>5/31, unasyn 5/31> 6/1 (for PNA)  - per [facility] -  Atrial fibrillation-stable backonhome medstoprol,cardizem, eliquis HTN HLD Diarrhea - c diffnegative5/23, no leukocytosis Severe malnutrition - prealbumin 5.7>>6.1>>15.1 today, continue TPN for now but hopefully stop soon if CT negative and diarrhea improving  Possible aspiration PNA - completed unasyn  LOS: 26 days    Henreitta Cea, [facility] 11/22/2020, 11:39 AM Please see Amion for pager number during day hours 7:00am-4:30pm

## 2020-11-22 NOTE — Progress Notes (Signed)
[facility] Progress Note  Patient: Robin [name]    CXK:[id number]  DOA: 10/26/2020     Date of Service: the patient was seen and examined on 11/22/2020  Brief hospital course: Past medical history of paroxysmal A. fib on Eliquis, HTN.  Presents with complaints of anemia found to have colonic neoplasm.  5/12 admission for GI bleed 5/13 colonoscopy- malignant tumor adjacent to the ileocecal valve. 5/16 Underwent laparoscopic hemicolectomy and reanastomosis.  5/26 persistent distention, vomiting, diarrhea postop, CT scan shows possible pneumatosis.  Started on IV antibiotics and TPN.  Continues to have postop developed ileus requiring prolonged TPN. Currently plan is monitor for improvement in postop ileus.  Assessment and Plan: 1.  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   Biopsy was positive for invasive adenocarcinoma. S/P laparoscopic proximal hemicolectomy with reanastomosis by Dr. Johney Maine on 5/16. Surgical pathology with negative margins. Patient will require outpatient oncology follow-up. Management per surgery.  2.  Postop ileus Now frequent diarrhea Developed prolonged postop ileus requiring NG tube insertion as well as TPN. Ileus continues remain a problem.  Repeat x-ray on 6/3 continues to show ileus. Surgery recommending clear liquid diet. TPN reduced on 6/1.  Based on the discussion with general surgery, back to full strength on 6/5. Repeat CT abdomen ordered on 6/6, contrast is passing through the rectum but continues to have dilated bowel. Management per surgery. No antidiarrheal regimen.  3.  Concern for pneumatosis Aspiration pneumonia 5/26 developed fever with tachycardia and leukocytosis. CT abdomen concerning for small bowel dilation and gastric distention as well as pneumatosis along the duodenum and portal venous gas. NG tube placed. started on IV Zosyn antibiotics.  No further  antibiotic needed per surgery. Completed course for aspiration pneumonia.  4.  Acute on chronic GI bleeding. Iron deficiency anemia FOBT was positive. Hemoglobin stable for now. Continue replacing iron and B12.  5.  Paroxysmal A. fib with RVR Rate currently controlled. On Toprol-XL, changing from twice daily to daily to minimize pill burden. Continue Cardizem and Eliquis. Monitor.  6.  Essential hypertension Blood pressure stable. Continue current regimen.  7.  Acute on chronic diastolic CHF Volume overload from TPN.  Intermittently requiring Lasix. Echocardiogram shows preserved EF without any wall motion abnormality. Monitor.  8.  Hypokalemia, hypophosphatemia, hypomagnesemia. Currently being replaced.  Monitor.  9.  Diarrhea. Patient had multiple bouts of diarrhea in last 24 hours. Surgery recommended no intervention for now as it will worsen her ileus.  Anticipating improvement on its own in 24 hours. Monitor electrolyte. No significant abdominal pain and abdomen actually appears soft therefore no concern for infectious etiology.  Body mass index is 23.8 kg/m.  Nutrition Problem: Increased nutrient needs Etiology: acute illness Interventions: Interventions: TPN  9.  Stage II right buttock pressure ulcer. POA status not clear. Continue home dressing. Pressure Injury 11/14/20 Buttocks Right Stage 2 -  Partial thickness loss of dermis presenting as a shallow open injury with a red, pink wound bed without slough. (Active)  11/14/20 2230  Location: Buttocks  Location Orientation: Right  Staging: Stage 2 -  Partial thickness loss of dermis presenting as a shallow open injury with a red, pink wound bed without slough.  Wound Description (Comments):   Present on Admission:     Diet: Clear liquid diet DVT Prophylaxis:   Place TED hose Start: 11/19/20 1210 SCDs Start: 10/26/20 2337 apixaban (ELIQUIS) tablet 5 mg    Advance goals of  care discussion: Full  code  Family Communication: no family was present at bedside, at the time of interview.  Discussed with daughter on the phone.  Disposition:  Status is: Inpatient  Remains inpatient appropriate because:IV treatments appropriate due to intensity of illness or inability to take PO  Dispo: The patient is from: Home              Anticipated d/c is to: SNF              Patient currently is not medically stable to d/c.   Difficult to place patient No  Subjective: Passing gas.  Diarrhea improving.  Abdominal distention still present but improving.  No nausea no vomiting.  Physical Exam: General: Appear in mild distress, no Rash; Oral Mucosa Clear, moist. no Abnormal Neck Mass Or lumps, Conjunctiva normal  Cardiovascular: S1 and S2 Present, no Murmur, Respiratory: good respiratory effort, Bilateral Air entry present and CTA, no Crackles, no wheezes Abdomen: Bowel Sound present, Soft and distended, no tenderness Extremities: no Pedal edema Neurology: alert and oriented to time, place, and person affect appropriate. no new focal deficit Gait not checked due to patient safety concerns   Vitals:   11/21/20 2142 11/22/20 0500 11/22/20 0528 11/22/20 1346  BP: 135/90  131/74 (!) 123/58  Pulse: 75  81 64  Resp: 18  18 18   Temp: 98 F (36.7 C)  98 F (36.7 C) 98.3 F (36.8 C)  TempSrc: Oral  Oral Oral  SpO2: 96%  100% 99%  Weight:  62.9 kg    Height:        Intake/Output Summary (Last 24 hours) at 11/22/2020 1917 Last data filed at 11/22/2020 1803 Gross per 24 hour  Intake 2601.78 ml  Output --  Net 2601.78 ml   Filed Weights   11/16/20 0609 11/18/20 0642 11/22/20 0500  Weight: 64.5 kg 64.4 kg 62.9 kg    Data Reviewed: I have personally reviewed and interpreted daily labs, tele strips, imaging. I reviewed all nursing notes, pharmacy notes, vitals, pertinent old records I have discussed plan of care as described above with RN and patient/family.  CBC: Recent Labs  Lab  11/16/20 1320 11/17/20 0312 11/18/20 0239 11/18/20 1151 11/19/20 0315 11/21/20 0314  WBC 12.0* 9.4  --  10.9* 10.2 8.7  NEUTROABS  --   --   --   --   --  6.7  HGB 9.8* 9.0* 9.6* 10.1* 9.4* 9.5*  HCT 33.6* 29.8*  --  34.1* 32.2* 32.5*  MCV 79.8* 79.9*  --  82.2 82.1 82.9  PLT 434* 389  --  447* 414* 035   Basic Metabolic Panel: Recent Labs  Lab 11/16/20 0910 11/17/20 0312 11/18/20 1350 11/19/20 0315 11/20/20 0304 11/21/20 0314  NA 134* 135 137 138 138 138  K 4.0 3.9 4.0 3.8 4.1 3.9  CL 100 103 103 105 105 106  CO2 27 28 27 26 27 27   GLUCOSE 157* 126* 152* 132* 128* 137*  BUN 20 22 19 18 21 23   CREATININE 0.46 0.47 0.55 0.48 0.53 0.47  CALCIUM 8.7* 8.9 9.4 9.4 9.5 9.2  MG  --  2.0  --  1.9 2.3 2.2  PHOS 2.5 2.7  --  2.8  --  2.6    Studies: No results found.  Scheduled Meds: . (feeding supplement) PROSource Plus  30 mL Oral BID BM  . apixaban  5 mg Oral BID  . Barium Sulfate  450 mL Oral BID  . Chlorhexidine  Gluconate Cloth  6 each Topical Daily  . diltiazem  180 mg Oral Daily  . famotidine  20 mg Oral Daily  . feeding supplement  1 Container Oral TID BM  . ferrous sulfate  325 mg Oral Q breakfast  . lactobacillus acidophilus & bulgar  1 tablet Oral TID WC  . lip balm  1 application Topical BID  . liver oil-zinc oxide   Topical BID  . metoprolol succinate  100 mg Oral Daily  . protein supplement  1 Scoop Oral TID WC  . simethicone  40 mg Oral QID  . sodium chloride flush  10-40 mL Intracatheter Q12H   Continuous Infusions: . methocarbamol (ROBAXIN) IV    . TPN CYCLIC-ADULT (ION) 444 mL/hr at 11/22/20 1830   PRN Meds: acetaminophen, acetaminophen, alum & mag hydroxide-simeth, artificial tears, bisacodyl, fentaNYL (SUBLIMAZE) injection, LORazepam, magic mouthwash, menthol-cetylpyridinium, methocarbamol (ROBAXIN) IV, metoprolol tartrate, ondansetron **OR** ondansetron (ZOFRAN) IV, phenol, prochlorperazine **OR** prochlorperazine, sodium chloride flush,  traMADol  Time spent: 35 minutes  Author: Berle Mull, MD [facility] 11/22/2020 7:17 PM  To reach On-call, see care teams to locate the attending and reach out via [email or web address]. Between 7PM-7AM, please contact night-coverage If you still have difficulty reaching the attending provider, please page the [facility] (Director on Call) for [facility] on amion for assistance.

## 2020-11-23 DIAGNOSIS — E559 Vitamin D deficiency, unspecified: Secondary | ICD-10-CM | POA: Insufficient documentation

## 2020-11-23 DIAGNOSIS — E785 Hyperlipidemia, unspecified: Secondary | ICD-10-CM | POA: Insufficient documentation

## 2020-11-23 DIAGNOSIS — E2839 Other primary ovarian failure: Secondary | ICD-10-CM | POA: Insufficient documentation

## 2020-11-23 DIAGNOSIS — I10 Essential (primary) hypertension: Secondary | ICD-10-CM | POA: Insufficient documentation

## 2020-11-23 DIAGNOSIS — K580 Irritable bowel syndrome with diarrhea: Secondary | ICD-10-CM

## 2020-11-23 DIAGNOSIS — N200 Calculus of kidney: Secondary | ICD-10-CM | POA: Insufficient documentation

## 2020-11-23 DIAGNOSIS — R911 Solitary pulmonary nodule: Secondary | ICD-10-CM | POA: Insufficient documentation

## 2020-11-23 LAB — CBC
HCT: 31.2 % — ABNORMAL LOW (ref 36.0–46.0)
Hemoglobin: 9.3 g/dL — ABNORMAL LOW (ref 12.0–15.0)
MCH: 24.7 pg — ABNORMAL LOW (ref 26.0–34.0)
MCHC: 29.8 g/dL — ABNORMAL LOW (ref 30.0–36.0)
MCV: 82.8 fL (ref 80.0–100.0)
Platelets: 364 10*3/uL (ref 150–400)
RBC: 3.77 MIL/uL — ABNORMAL LOW (ref 3.87–5.11)
RDW: 26.9 % — ABNORMAL HIGH (ref 11.5–15.5)
WBC: 8.8 10*3/uL (ref 4.0–10.5)
nRBC: 0 % (ref 0.0–0.2)

## 2020-11-23 LAB — BASIC METABOLIC PANEL
Anion gap: 5 (ref 5–15)
BUN: 27 mg/dL — ABNORMAL HIGH (ref 8–23)
CO2: 25 mmol/L (ref 22–32)
Calcium: 8.8 mg/dL — ABNORMAL LOW (ref 8.9–10.3)
Chloride: 109 mmol/L (ref 98–111)
Creatinine, Ser: 0.5 mg/dL (ref 0.44–1.00)
GFR, Estimated: >60 mL/min (ref >60)
Glucose, Bld: 132 mg/dL — ABNORMAL HIGH (ref 70–99)
Potassium: 3.8 mmol/L (ref 3.5–5.1)
Sodium: 139 mmol/L (ref 135–145)

## 2020-11-23 LAB — GLUCOSE, CAPILLARY
Glucose-Capillary: 149 mg/dL — ABNORMAL HIGH (ref 70–99)
Glucose-Capillary: 113 mg/dL — ABNORMAL HIGH (ref 70–99)

## 2020-11-23 LAB — MAGNESIUM: Magnesium: 2.3 mg/dL (ref 1.7–2.4)

## 2020-11-23 LAB — PHOSPHORUS: Phosphorus: 2 mg/dL — ABNORMAL LOW (ref 2.5–4.6)

## 2020-11-23 MED ORDER — TRAVASOL 10 % IV SOLN
INTRAVENOUS | Status: DC
  Filled 2020-11-23: qty 480

## 2020-11-23 MED ORDER — PSYLLIUM 95 % PO PACK
1.0000 | PACK | Freq: Every day | ORAL | Status: DC
  Administered 2020-11-23: 1 via ORAL
  Filled 2020-11-23: qty 1

## 2020-11-23 MED ORDER — FUROSEMIDE 10 MG/ML IJ SOLN
20.0000 mg | Freq: Two times a day (BID) | INTRAMUSCULAR | Status: AC
Start: 2020-11-23 — End: 2020-11-24
  Administered 2020-11-23: 20 mg via INTRAVENOUS
  Filled 2020-11-23 (×2): qty 2

## 2020-11-23 MED ORDER — POTASSIUM CHLORIDE CRYS ER 20 MEQ PO TBCR: 40.0000 meq | EXTENDED_RELEASE_TABLET | Freq: Two times a day (BID) | ORAL | Status: DC

## 2020-11-23 MED ORDER — RISAQUAD PO CAPS
2.0000 | ORAL_CAPSULE | Freq: Two times a day (BID) | ORAL | Status: DC
  Administered 2020-11-23 – 2020-11-29 (×13): 2 via ORAL
  Filled 2020-11-23 (×13): qty 2

## 2020-11-23 MED ORDER — SIMETHICONE 40 MG/0.6ML PO SUSP
40.0000 mg | Freq: Four times a day (QID) | ORAL | Status: DC | PRN
  Filled 2020-11-23: qty 0.6

## 2020-11-23 MED ORDER — CHOLESTYRAMINE LIGHT 4 G PO PACK
4.0000 g | PACK | Freq: Two times a day (BID) | ORAL | Status: DC
  Administered 2020-11-23 – 2020-11-28 (×3): 4 g via ORAL
  Filled 2020-11-23 (×15): qty 1

## 2020-11-23 MED ORDER — CALCIUM POLYCARBOPHIL 625 MG PO TABS: 625.0000 mg | ORAL_TABLET | Freq: Every day | ORAL | Status: DC

## 2020-11-23 MED ORDER — POTASSIUM CHLORIDE CRYS ER 20 MEQ PO TBCR
40.0000 meq | EXTENDED_RELEASE_TABLET | Freq: Once | ORAL | Status: AC
  Administered 2020-11-23: 40 meq via ORAL
  Filled 2020-11-23: qty 2

## 2020-11-23 MED ORDER — POLYETHYLENE GLYCOL 3350 17 G PO PACK: 17.0000 g | PACK | Freq: Every day | ORAL | Status: DC

## 2020-11-23 MED ORDER — DIPHENOXYLATE-ATROPINE 2.5-0.025 MG PO TABS: 1.0000 | ORAL_TABLET | Freq: Every day | ORAL | Status: DC | PRN

## 2020-11-23 MED ORDER — DIPHENOXYLATE-ATROPINE 2.5-0.025 MG PO TABS
1.0000 | ORAL_TABLET | Freq: Two times a day (BID) | ORAL | Status: DC | PRN
  Administered 2020-11-23: 1 via ORAL
  Filled 2020-11-23: qty 1

## 2020-11-23 MED ORDER — METOCLOPRAMIDE HCL 5 MG PO TABS: 5.0000 mg | ORAL_TABLET | Freq: Three times a day (TID) | ORAL | Status: DC

## 2020-11-23 MED ORDER — METOCLOPRAMIDE HCL 5 MG PO TABS: 5.0000 mg | ORAL_TABLET | Freq: Four times a day (QID) | ORAL | Status: DC | PRN

## 2020-11-23 MED ORDER — POTASSIUM PHOSPHATES 15 MMOLE/5ML IV SOLN
20.0000 mmol | Freq: Once | INTRAVENOUS | Status: AC
  Administered 2020-11-23: 20 mmol via INTRAVENOUS
  Filled 2020-11-23: qty 6.67

## 2020-11-23 MED ORDER — FERROUS SULFATE 325 (65 FE) MG PO TABS
325.0000 mg | ORAL_TABLET | Freq: Two times a day (BID) | ORAL | Status: DC
  Administered 2020-11-23 – 2020-11-29 (×13): 325 mg via ORAL
  Filled 2020-11-23 (×14): qty 1

## 2020-11-23 MED ORDER — CALCIUM POLYCARBOPHIL 625 MG PO TABS
625.0000 mg | ORAL_TABLET | Freq: Every day | ORAL | Status: DC
  Administered 2020-11-23 – 2020-11-25 (×3): 625 mg via ORAL
  Filled 2020-11-23 (×3): qty 1

## 2020-11-23 NOTE — Progress Notes (Signed)
[facility] Progress Note  Patient: Robin [name]    BHA:[id number]  DOA: 10/26/2020     Date of Service: the patient was seen and examined on 11/23/2020  Brief hospital course: 86 year old female with history of paroxysmal atrial fibrillation on Eliquis, hypertension, obesity presented to the ER ED with an deficiency anemia and lower GI bleed . 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 -Has had a prolonged postop course with recurrent postop ileus  [facility] course was also complicated by aspiration pneumonia, pulmonary edema etc.  -On 5/26 and increasing abdominal distention and vomiting CT was notable for possible pneumatosis started on IV antibiotics and TPN and then   Assessment and Plan:  Adenocarcinoma of ascending colon  Iron deficiency anemia  -Underwent colonoscopy 5/13, biopsy positive for invasive adenocarcinoma. -Underwent laparoscopic proximal hemicolectomy with reanastomosis by Dr. Johney Maine on 5/16, pathology had negative margins -Had prolonged postop ileus  Postop ileus, ?  Pneumatosis Chronic diarrhea -Developed prolonged postop ileus requiring NG tube insertion as well as TPN. -Diet being slowly advanced, briefly treated with IV Zosyn as well, now discontinued -TPN to be tapered -Avoid antidiarrheals, increase activity as tolerated, some of her diarrhea could be from resolving ileus -Discharge planning  Aspiration pneumonia -Completed antibiotic course for this  Paroxysmal A. fib with RVR Rate currently controlled. -Continue Toprol, Cardizem and Eliquis  Essential hypertension -Stable, continue current regimen.  Acute on chronic diastolic CHF Volume overload from TPN.  Intermittently requiring Lasix. Echocardiogram shows preserved EF without any wall motion abnormality. -IV Lasix x2 doses today  Hypokalemia, hypophosphatemia,  hypomagnesemia. -Replaced  Body mass index is 23.54 kg/m.  Nutrition Problem: Increased nutrient needs Etiology: acute illness Interventions: Interventions: TPN  Stage II right buttock pressure ulcer. POA status not clear. Continue home dressing. Pressure Injury 11/14/20 Buttocks Right Stage 2 -  Partial thickness loss of dermis presenting as a shallow open injury with a red, pink wound bed without slough. (Active)  11/14/20 2230  Location: Buttocks  Location Orientation: Right  Staging: Stage 2 -  Partial thickness loss of dermis presenting as a shallow open injury with a red, pink wound bed without slough.  Wound Description (Comments):   Present on Admission:     DVT Prophylaxis:   Place TED hose Start: 11/19/20 1210 SCDs Start: 10/26/20 2337 apixaban (ELIQUIS) tablet 5 mg    Advance goals of care discussion: Full code  Family Communication: No family at bedside  Disposition:  Status is: Inpatient  Remains inpatient appropriate because:IV treatments appropriate due to intensity of illness or inability to take PO  Dispo: The patient is from: Home              Anticipated d/c is to: SNF              Patient currently is not medically stable to d/c.   Difficult to place patient No   Physical Exam: General: Chronically ill elderly female, sitting up in bed, awake alert oriented x3 CVS: S1-S2, regular rate rhythm Lungs: Decreased breath sounds both bases Abdomen: Soft, mildly distended, nontender, bowel sounds increased Extremities: 1+ edema with compression stockings on Skin: No rash on exposed skin  Vitals:   11/22/20 0528 11/22/20 1346 11/22/20 2114 11/23/20 0510  BP: 131/74 (!) 123/58 (!) 129/55 (!) 136/59  Pulse: 81 64 69 86  Resp: 18 18 18 18   Temp: 98 F (36.7 C) 98.3 F (  36.8 C) 97.7 F (36.5 C) 98.3 F (36.8 C)  TempSrc: Oral Oral Oral Oral  SpO2: 100% 99% 99% 96%  Weight:    62.2 kg  Height:        Intake/Output Summary (Last 24 hours) at  11/23/2020 1214 Last data filed at 11/23/2020 1000 Gross per 24 hour  Intake 2846.96 ml  Output 200 ml  Net 2646.96 ml   Filed Weights   11/18/20 0642 11/22/20 0500 11/23/20 0510  Weight: 64.4 kg 62.9 kg 62.2 kg   CBC: Recent Labs  Lab 11/17/20 0312 11/18/20 0239 11/18/20 1151 11/19/20 0315 11/21/20 0314 11/23/20 0459  WBC 9.4  --  10.9* 10.2 8.7 8.8  NEUTROABS  --   --   --   --  6.7  --   HGB 9.0* 9.6* 10.1* 9.4* 9.5* 9.3*  HCT 29.8*  --  34.1* 32.2* 32.5* 31.2*  MCV 79.9*  --  82.2 82.1 82.9 82.8  PLT 389  --  447* 414* 387 161   Basic Metabolic Panel: Recent Labs  Lab 11/17/20 0312 11/18/20 1350 11/19/20 0315 11/20/20 0304 11/21/20 0314 11/23/20 0459  NA 135 137 138 138 138 139  K 3.9 4.0 3.8 4.1 3.9 3.8  CL 103 103 105 105 106 109  CO2 28 27 26 27 27 25   GLUCOSE 126* 152* 132* 128* 137* 132*  BUN 22 19 18 21 23  27*  CREATININE 0.47 0.55 0.48 0.53 0.47 0.50  CALCIUM 8.9 9.4 9.4 9.5 9.2 8.8*  MG 2.0  --  1.9 2.3 2.2 2.3  PHOS 2.7  --  2.8  --  2.6 2.0*    Studies: No results found.  Scheduled Meds: . (feeding supplement) PROSource Plus  30 mL Oral BID BM  . acidophilus  2 capsule Oral BID  . apixaban  5 mg Oral BID  . Chlorhexidine Gluconate Cloth  6 each Topical Daily  . cholestyramine  4 g Oral Q12H  . diltiazem  180 mg Oral Daily  . famotidine  20 mg Oral Daily  . feeding supplement  1 Container Oral TID BM  . ferrous sulfate  325 mg Oral BID WC  . furosemide  20 mg Intravenous Q12H  . lactobacillus acidophilus & bulgar  1 tablet Oral TID WC  . lip balm  1 application Topical BID  . liver oil-zinc oxide   Topical BID  . metoprolol succinate  100 mg Oral Daily  . polycarbophil  625 mg Oral Daily  . protein supplement  1 Scoop Oral TID WC  . sodium chloride flush  10-40 mL Intracatheter Q12H   Continuous Infusions: . methocarbamol (ROBAXIN) IV    . potassium PHOSPHATE IVPB (in mmol) 20 mmol (11/23/20 1132)  . TPN ADULT (ION)     PRN Meds:  acetaminophen, acetaminophen, alum & mag hydroxide-simeth, artificial tears, bisacodyl, fentaNYL (SUBLIMAZE) injection, LORazepam, magic mouthwash, menthol-cetylpyridinium, methocarbamol (ROBAXIN) IV, metoCLOPramide, metoprolol tartrate, ondansetron **OR** ondansetron (ZOFRAN) IV, phenol, prochlorperazine **OR** prochlorperazine, simethicone, sodium chloride flush, traMADol  Time spent: 35 minutes  Domenic Polite, MD [facility] 11/23/2020 12:14 PM

## 2020-11-23 NOTE — Progress Notes (Signed)
Progress Note  23 Days Post-Op  Subjective: Tolerating soft diet, only having nausea at night. Multiple small volume bowel movements and one larger volume diarrhea episode yesterday.   Objective: Vital signs in last 24 hours: Temp:  [97.7 F (36.5 C)-98.3 F (36.8 C)] 98.3 F (36.8 C) (06/08 0510) Pulse Rate:  [64-86] 86 (06/08 0510) Resp:  [18] 18 (06/08 0510) BP: (123-136)/(55-59) 136/59 (06/08 0510) SpO2:  [96 %-99 %] 96 % (06/08 0510) Weight:  [62.2 kg] 62.2 kg (06/08 0510) Last BM Date: 11/23/20  Intake/Output from previous day: 06/07 0701 - 06/08 0700 In: 2847 [P.O.:720; I.V.:2127] Out: 200 [Urine:200] Intake/Output this shift: No intake/output data recorded.  PE: Abd: soft, NT, some distention, +BS, incisions c/d/i   Lab Results:  Recent Labs    11/21/20 0314 11/23/20 0459  WBC 8.7 8.8  HGB 9.5* 9.3*  HCT 32.5* 31.2*  PLT 387 364   BMET Recent Labs    11/21/20 0314 11/23/20 0459  NA 138 139  K 3.9 3.8  CL 106 109  CO2 27 25  GLUCOSE 137* 132*  BUN 23 27*  CREATININE 0.47 0.50  CALCIUM 9.2 8.8*   PT/INR No results for input(s): LABPROT, INR in the last 72 hours. CMP     Component Value Date/Time   NA 139 11/23/2020 0459   NA 139 02/21/2018 0854   K 3.8 11/23/2020 0459   CL 109 11/23/2020 0459   CO2 25 11/23/2020 0459   GLUCOSE 132 (H) 11/23/2020 0459   BUN 27 (H) 11/23/2020 0459   BUN 20 02/21/2018 0854   CREATININE 0.50 11/23/2020 0459   CREATININE 0.54 (L) 11/28/2015 1422   CALCIUM 8.8 (L) 11/23/2020 0459   PROT 5.5 (L) 11/21/2020 0314   ALBUMIN 2.4 (L) 11/21/2020 0314   AST 36 11/21/2020 0314   ALT 37 11/21/2020 0314   ALKPHOS 240 (H) 11/21/2020 0314   BILITOT 0.7 11/21/2020 0314   GFRNONAA >60 11/23/2020 0459   GFRAA 78 02/21/2018 0854   Lipase     Component Value Date/Time   LIPASE 31 10/26/2020 1919       Studies/Results: CT ABDOMEN PELVIS WO CONTRAST  Result Date: 11/21/2020 CLINICAL DATA:  Bowel obstruction  suspected. Ileus. Laparoscopic hemicolectomy 10/31/2020. EXAM: CT ABDOMEN AND PELVIS WITHOUT CONTRAST TECHNIQUE: Multidetector CT imaging of the abdomen and pelvis was performed following the standard protocol without IV contrast. COMPARISON:  Postoperative CT 11/10/2020. Preoperative CT 10/28/2020 FINDINGS: Lower chest: Resolved left basilar opacity from prior exam. Similar appearing reticulation and nodularity in the anterior right lower and right middle lobes appear to represent scarring. Hepatobiliary: No evidence of focal liver abnormality on this noncontrast exam. Previous portal venous gas has resolved. Cholecystectomy. No definite biliary dilatation, common bile duct not well visualized. Pancreas: Mild parenchymal atrophy. No ductal dilatation or inflammation. Spleen: Normal in size without focal abnormality. Adrenals/Urinary Tract: No adrenal nodule. No hydronephrosis or perinephric edema. Left peripelvic and right renal cortical cyst. The urinary bladder is near completely empty and not well assessed. Stomach/Bowel: No abnormal gastric distension. Normal positioning of the duodenum and ligament of Treitz. Status post right hemicolectomy with enteric sutures in the mid transverse colon. Small bowel loops are diffusely dilated and contrast filled, however enteric contrast reaches the rectosigmoid colon, no obstruction occasional areas of small bowel thickening, including in the right lower quadrant adjacent to an area of presumed fat necrosis, for example coronal series 4, image 34. the previous small bowel pneumatosis is no longer seen. Vascular/Lymphatic:  Aortic atherosclerosis. No aortic aneurysm. There is no portal venous or mesenteric gas. No bulky abdominopelvic adenopathy. Reproductive: Uterus is surgically absent. There is no adnexal mass. Other: There is a rounded area of fatty inflammation in the pelvis just to the right of midline with small amount of free fluid suspicious for fat necrosis. This  spans approximately 6.2 cm, series 2, image 64. This is more coalescent than seen on prior exam. There is a small volume of free fluid in the posterior pelvis but no loculated collection. No free air. Minimal subcutaneous edema. Musculoskeletal: Chronic T12, L2 and minimal L3 superior endplate compression fractures. Multilevel degenerative change in the spine. No acute osseous abnormalities are seen. IMPRESSION: 1. Post right hemicolectomy with enteric sutures in the mid transverse colon. Small bowel is dilated, however no evidence of obstruction with enteric contrast reaching the rectosigmoid colon. Previous small bowel pneumatosis and portal venous gas has resolved. No evidence of bowel obstruction. 2. Occasional areas of small bowel thickening in the pelvis adjacent to presumed fat necrosis described below. 3. A rounded area of fatty inflammation in the pelvis just to the right of midline with small amount of free fluid suspicious for fat necrosis. This is more coalescent than on prior exam. 4. Small volume of free fluid in the posterior pelvis but no loculated collection/abscess. No free air. Aortic Atherosclerosis (ICD10-I70.0). Electronically Signed   By: Keith Rake M.D.   On: 11/21/2020 19:32    Anti-infectives: Anti-infectives (From admission, onward)   Start     Dose/Rate Route Frequency Ordered Stop   11/16/20 1200  Ampicillin-Sulbactam (UNASYN) 3 g in sodium chloride 0.9 % 100 mL IVPB        3 g 200 mL/hr over 30 Minutes Intravenous Every 6 hours 11/16/20 0755 11/17/20 0840   11/15/20 1200  Ampicillin-Sulbactam (UNASYN) 3 g in sodium chloride 0.9 % 100 mL IVPB  Status:  Discontinued        3 g 200 mL/hr over 30 Minutes Intravenous Every 6 hours 11/15/20 1055 11/16/20 0755   11/11/20 1800  anidulafungin (ERAXIS) 100 mg in sodium chloride 0.9 % 100 mL IVPB  Status:  Discontinued       "Followed by" Linked Group Details   100 mg 78 mL/hr over 100 Minutes Intravenous Every 24 hours  11/10/20 1622 11/15/20 0815   11/10/20 2200  piperacillin-tazobactam (ZOSYN) IVPB 3.375 g  Status:  Discontinued        3.375 g 12.5 mL/hr over 240 Minutes Intravenous Every 8 hours 11/10/20 1610 11/15/20 1035   11/10/20 1800  anidulafungin (ERAXIS) 200 mg in sodium chloride 0.9 % 200 mL IVPB       "Followed by" Linked Group Details   200 mg 78 mL/hr over 200 Minutes Intravenous  Once 11/10/20 1622 11/11/20 0016   11/10/20 1700  anidulafungin (ERAXIS) 100 mg in sodium chloride 0.9 % 100 mL IVPB  Status:  Discontinued        100 mg 78 mL/hr over 100 Minutes Intravenous Every 24 hours 11/10/20 1610 11/10/20 1621   11/10/20 1700  piperacillin-tazobactam (ZOSYN) IVPB 3.375 g        3.375 g 100 mL/hr over 30 Minutes Intravenous  Once 11/10/20 1618 11/10/20 1720   11/08/20 0045  vancomycin (VANCOCIN) 50 mg/mL oral solution 125 mg  Status:  Discontinued        125 mg Oral Every 6 hours 11/07/20 2354 11/08/20 1304   10/31/20 2200  cefoTEtan (CEFOTAN) 2 g in sodium chloride  0.9 % 100 mL IVPB        2 g 200 mL/hr over 30 Minutes Intravenous Every 12 hours 10/31/20 1744 10/31/20 2205   10/31/20 0915  cefoTEtan (CEFOTAN) 2 g in sodium chloride 0.9 % 100 mL IVPB        2 g 200 mL/hr over 30 Minutes Intravenous On call to O.R. 10/31/20 0824 10/31/20 1312       Assessment/Plan POD#23 s/p lap assisted right hemicolectomy for ascending colon mass (adenocarcinoma) and multiple polyps (likely pre-malignant), Dr. Johney Maine 10/31/20 -CT scan reveals contrast in the colon by the time of the scan suggesting her gut is working, but her bowels remain distended.  No nausea with soft dietn -CT with some fat necrosis in the pelvis, but no other complications or abscesses noted. - Try weaning TPN again. - C. Diff negative 5/23, no leukocytosis  -cont mobilization/IS/pulm toilet -add fiber supplement for diarrhea. No lomotil or anti-motility agents given ileus picture.   Acute blood loss anemiain setting of  chronic GI losses from cancer.Hgb stable  FEN -heart healthy, TPN  VTE -Eliquis ID -Cefotetanpre-op, PO vancomycin 5/24>5/24, zosyn 5/26>5/31, eraxis 5/26>5/31, unasyn 5/31> 6/1 (for PNA)  - per [facility] -  Atrial fibrillation-stable backonhome medstoprol,cardizem, eliquis HTN HLD Diarrhea - c diffnegative5/23, no leukocytosis- adding fiber supplement today Severe malnutrition - prealbumin 5.7>>6.1>>15.1 today, continue TPN for now but hopefully stop soon if CT negative and diarrhea improving  Possible aspiration PNA - completed unasyn  LOS: 27 days    Robin Riley, MD [facility] 11/23/2020, 8:54 AM Please see Amion for pager number during day hours 7:00am-4:30pm

## 2020-11-23 NOTE — Progress Notes (Signed)
Occupational Therapy Treatment Patient Details Name: Robin [name] MRN: [id number] DOB: [birth year] Today's Date: 11/23/2020    History of present illness 86 year old F presented to ED for anemia and shortness of breath, and admitted for blood loss anemia due to GI bleed on 10/26/20.  Hgb 8.5 (reportedly 11 in January 2022 per daughter).  Hemoccult positive.  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 s/p laparoscopic hemicolectomy 10/31/20. Pt  with PMH of A. fib on Eliquis and HTN. Hospitilization complicated by ileus.   OT comments  Patient overall supervision level with functional ambulation in room, pushing IV pole to sink for standing ADL tasks. Patient able to wash face, hands, and upper body needing assist to reach back only. During ADLS breakfast arrive therefore patient ask to defer brushing her teeth at this time. When discussing discharge planning patient states there are still "some things she needs help with" but is not medically ready to D/C to rehab yet. Patient doing well with OT and may be able to progress to [facility] pending length of stay/patient activity tolerance as she does live alone. Will continue to follow.    Follow Up Recommendations  SNF;Home health OT    Equipment Recommendations  None recommended by OT       Precautions / Restrictions Precautions Precautions: Other (comment) Precaution Comments: abdominal surgery Restrictions Weight Bearing Restrictions: No       Mobility Bed Mobility               General bed mobility comments: OOB    Transfers Overall transfer level: Needs assistance Equipment used: None (IV pole) Transfers: Sit to/from Stand Sit to Stand: Supervision         General transfer comment: no physical assistance needed    Balance Overall balance assessment: Mild deficits observed, not formally tested                                          ADL either performed or assessed with clinical judgement   ADL Overall ADL's : Needs assistance/impaired     Grooming: Wash/dry face;Wash/dry hands;Supervision/safety;Standing   Upper Body Bathing: Supervision/ safety;Standing Upper Body Bathing Details (indicate cue type and reason): assist with back only             Toilet Transfer: Supervision/safety;Ambulation Toilet Transfer Details (indicate cue type and reason): patient pushing IV pole from bed to sink side then without AD to take few steps from sink to recliner chair, no physical assistance needed or loss of balance noted         Functional mobility during ADLs: Supervision/safety General ADL Comments: Pt making good progress toward being more independent with adls. Patient stating there "are still some things I need to work on"               Cognition Arousal/Alertness: Awake/alert Behavior During Therapy: WFL for tasks assessed/performed Overall Cognitive Status: Within Functional Limits for tasks assessed                                                     Pertinent Vitals/ Pain       Pain Assessment: Faces Faces Pain Scale: No hurt  Frequency  Min 2X/week        Progress Toward Goals  OT Goals(current goals can now be found in the care plan section)  Progress towards OT goals: Progressing toward goals  Acute Rehab OT Goals Patient Stated Goal: to get stronger OT Goal Formulation: With patient Time For Goal Achievement: 11/28/20 Potential to Achieve Goals: Good ADL Goals Pt Will Perform Lower Body Dressing: with modified independence;sit to/from stand Pt Will Transfer to Toilet: with modified independence;ambulating;regular height toilet;grab bars Pt Will Perform Toileting - Clothing Manipulation and hygiene: with modified independence;sit to/from stand Additional ADL Goal #1: Patient will tolerate 10 minutes of standing activity at mod I level in  order to safely participate in daily routine  Plan Discharge plan needs to be updated       AM-PAC OT "6 Clicks" Daily Activity     Outcome Measure   Help from another person eating meals?: None Help from another person taking care of personal grooming?: A Little Help from another person toileting, which includes using toliet, bedpan, or urinal?: A Little Help from another person bathing (including washing, rinsing, drying)?: A Little Help from another person to put on and taking off regular upper body clothing?: A Little Help from another person to put on and taking off regular lower body clothing?: A Little 6 Click Score: 19    End of Session  OT Visit Diagnosis: Other abnormalities of gait and mobility (R26.89)   Activity Tolerance Patient tolerated treatment well   Patient Left in chair;with call bell/phone within reach   Nurse Communication Mobility status        Time: 4627-0350 OT Time Calculation (min): 15 min  Charges: OT General Charges $OT Visit: 1 Visit OT Treatments $Self Care/Home Management : 8-22 mins  Delbert Phenix OT OT pager: [phone]   Rosemary Holms 11/23/2020, 9:25 AM

## 2020-11-23 NOTE — Progress Notes (Signed)
Physical Therapy Treatment Patient Details Name: Robin [name] MRN: [id number] DOB: May 03, 1935 Today's Date: 11/23/2020    History of Present Illness 86 year old F presented to ED for anemia and shortness of breath, and admitted for blood loss anemia due to GI bleed on 10/26/20.  Hgb 8.5 (reportedly 11 in January 2022 per daughter).  Hemoccult positive.  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 s/p laparoscopic hemicolectomy 10/31/20. Pt  with PMH of A. fib on Eliquis and HTN. Hospitilization complicated by ileus.    PT Comments    Pt making gradual progress. She is ambulating with RW and close supervision due to unsteadiness.  Pt does live alone, fatigues easily, fall risk, and having frequent diarrhea which makes her weak and urgency to get to bathroom increases fall risk.  Daughter and pt still wish to pursue SNF placement at this time to progress mobility and safety. Continue plan of care.     Follow Up Recommendations  SNF     Equipment Recommendations  3in1 (PT)    Recommendations for Other Services       Precautions / Restrictions Precautions Precautions: Fall Precaution Comments: abdominal surgery    Mobility  Bed Mobility               General bed mobility comments: in chair at arrival    Transfers Overall transfer level: Needs assistance Equipment used: Rolling walker (2 wheeled) (and IV pole) Transfers: Sit to/from Stand Sit to Stand: Supervision         General transfer comment: Performed sit to stand x 2 with supervision using IV pole or RW  Ambulation/Gait Ambulation/Gait assistance: Supervision Gait Distance (Feet): 200 Feet (200' then 10'x2) Assistive device: Rolling walker (2 wheeled);IV Pole Gait Pattern/deviations: Step-through pattern;Decreased stride length Gait velocity: decreased   General Gait Details: Ambulated 200' in hall with RW and close  supervision.  Ambulated 10'x2 in room with IV pole.  Fatigued easily with mild unsteadiness   Stairs             Wheelchair Mobility    Modified Rankin (Stroke Patients Only)       Balance Overall balance assessment: Needs assistance Sitting-balance support: No upper extremity supported Sitting balance-Leahy Scale: Good     Standing balance support: No upper extremity supported Standing balance-Leahy Scale: Fair Standing balance comment: Using RW or IV pole to ambulate.  Performed toielting ADLs without UE assist                            Cognition Arousal/Alertness: Awake/alert Behavior During Therapy: WFL for tasks assessed/performed Overall Cognitive Status: Within Functional Limits for tasks assessed                                        Exercises      General Comments General comments (skin integrity, edema, etc.): Discussed progress with pt and daughter (on phone).  Noted waiting on insurance approval for SNF.  Encouraged and educated on other options if SNF is denied.  Discussed home with intermittent supervision from daughter or in home aides.  Daughter would prefer SNF as pt is weak, fall risk, and does not feel she could manage on her own.      Pertinent Vitals/Pain Pain Assessment: No/denies pain    Home Living  Prior Function            PT Goals (current goals can now be found in the care plan section) Acute Rehab PT Goals Patient Stated Goal: to get stronger PT Goal Formulation: With patient Time For Goal Achievement: 12/05/20 Potential to Achieve Goals: Good Progress towards PT goals: Progressing toward goals    Frequency    Min 3X/week      PT Plan Current plan remains appropriate    Co-evaluation              AM-PAC PT "6 Clicks" Mobility   Outcome Measure  Help needed turning from your back to your side while in a flat bed without using bedrails?: A Little Help  needed moving from lying on your back to sitting on the side of a flat bed without using bedrails?: A Little Help needed moving to and from a bed to a chair (including a wheelchair)?: A Little Help needed standing up from a chair using your arms (e.g., wheelchair or bedside chair)?: A Little Help needed to walk in hospital room?: A Little Help needed climbing 3-5 steps with a railing? : A Little 6 Click Score: 18    End of Session   Activity Tolerance: Patient tolerated treatment well Patient left: with call bell/phone within reach;in chair (pt has been ambulating to bathroom on her own with IV pole) Nurse Communication: Mobility status PT Visit Diagnosis: Difficulty in walking, not elsewhere classified (R26.2)     Time: 9390-3009 PT Time Calculation (min) (ACUTE ONLY): 28 min  Charges:  $Gait Training: 8-22 mins $Therapeutic Activity: 8-22 mins                     Abran Richard, PT Acute Rehab Services Pager [phone] [facility] [place] 11/23/2020, 2:00 PM

## 2020-11-23 NOTE — TOC Progression Note (Signed)
Transition of Care [facility]) - Progression Note   Patient Details  Name: Robin [name] MRN: [id number] Date of Birth: 01-11-1935  Transition [facility]) CM/SW [place], LCSW Phone Number: 11/23/2020, 2:44 PM  Clinical Narrative: CSW called [employer] to start insurance authorization. Reference # is: [id number]. Clinicals faxed to [facility]. TOC awaiting approval/denial for SNF.  Expected Discharge Plan: Skilled Nursing Facility Barriers to Discharge: Continued Medical Work up  Expected Discharge Plan and Services Expected Discharge Plan: [place] Discharge Planning Services: CM Consult Living arrangements for the past 2 months: Single Family Home  Readmission Risk Interventions Readmission Risk Prevention Plan 11/17/2020  Transportation Screening Complete  HRI or Home Care Consult Complete  Social Work Consult for [place] Planning/Counseling Complete  Palliative Care Screening Not Applicable  Medication Review Press photographer) Complete  Some recent data might be hidden

## 2020-11-23 NOTE — Progress Notes (Signed)
PHARMACY - TOTAL PARENTERAL NUTRITION CONSULT NOTE   Indication: Small bowel obstruction; anticipate prolonged NPO status  Patient Measurements: Height: 5' 4" (162.6 cm) Weight: 62.2 kg (137 lb 2 oz) IBW/kg (Calculated) : 54.7 TPN AdjBW (KG): 65.4 Body mass index is 23.54 kg/m. Usual Weight: 66 kg (~10 lb weight loss this admission)  Assessment:  85 yoF with PMH Afib admitted 5/11 with anemia; found to have bleeding colonic mass suspicious for malignancy and underwent hemicolectomy 5/16. Developed ileus POD3 which initially improved, then worsened POD10. CT showed massive SBO and no immediate indication for surgery, so Pharmacy consulted to begin TPN for expected prolonged ileus.  Patients diet is being slowly advanced and is tolerating soft diet.  Has had multiple bowel movements.  Pharmacy asked to taper TPN 6/8.  Glucose / Insulin: no Hx DM; CBGs 127-171. Used 4 units sSSI on 6/6 Electrolytes: Na 139, K 3.8, CoCa 10.1, Mg 2.3, Phos 2 Renal: Scr <1, BUN 27 (steady inc), bicarb stable WNL; UOP not fully charted Hepatic: Tbili improved to WNL; LFTs WNL except Alk Phos elevated and albumin low/stable; TG stable WNL on 6/6 Prealbumin: low 15.7, improved since starting TPN I/O: d/c NGT 5/29 - dCHF and diuresing since 5/29, no MIVF - still with diarrhea but CCS note, BMs appear to be becoming more formed GI Imaging: - 5/13 colonoscopy: multiple polyps; larger masses at cecum and transverse colon - 5/14 MRI liver: no metastatic dz - 5/18 AXR: dilated SB loops; possible ileus - 5/26 CTa/p: severely dilated SB loops (not including ileocolonic anastomosis) concerning for distal SBO, also intramural and intrahepatic portal venous gas suggesting possible ischemic bowel - 5/31 mildly distended bowel loops: SBO vs ileus - 6/6 CT A/P: small bowel dilated but no evidence of SBO, prev small bowel pneumatosis resolved Surgeries / Procedures:  - 5/16 laparoscopic hemicolectomy/cecectomy; omentopexy  of ileocolonic anastomosis  Central access: PICC placed 5/27 TPN start date: 5/27  Nutritional Goals RD recs 5/27 Kcal:  1700-1900 kcal Protein:  80-95 grams Fluid:  >/= 2 L/day  TPN at goal rate of 75 mL/hr provides 90 g of protein and 1818 kcals per day  Current Nutrition:  Soft diet starting 6/7 TPN - consult to taper 6/8  Plan:  Begin half TPN (40 ml/hr) at 1800 per surgery rec's  Switch to continuous TPN for ease of weaning  Electrolytes in TPN  Na - 75 mEq/L  K - 70 mEq/L  Ca - 0 mEq/L  Mg - 5 mEq/L  Phos - 25 mmol/L  Cl:Ac ratio 1:1  Add standard MVI and trace elements to TPN  Give potassium phosphate 20mmol IV x1  Give potassium 40 mEq PO x1  MIVF per primary  Monitor TPN labs on Mon/Thurs  F/u PO intake   Mary-Ashlyn Tucker, PharmD PGY-1 Acute Care Pharmacy Resident 11/23/2020 8:21 AM    

## 2020-11-23 NOTE — Progress Notes (Addendum)
Note: Portions of this report may have been transcribed using voice recognition software. Every effort was made to ensure accuracy; however, inadvertent computerized transcription errors may be present.   Any transcriptional errors that result from this process are unintentional.              Robin [name]  [birth year] [id number]  Patient Care Team: [name], PA-C as PCP - General (Family Medicine) Skeet Latch, MD as PCP - Cardiology (Cardiology) Milus Banister, MD as Attending Physician (Gastroenterology) [name], MD as Consulting Physician (General Surgery)  Patient sitting in bed feeling better.  No more nausea vomiting episodes although claims she feels like she gets morning sickness occasionally.  Still having loose bowel movements.  However a little better yesterday.  In discussing with her, she has had chronic irritable bowel with diarrhea using occasional Lomotil.  She does not have much of an appetite but is willing to do the supplemental shakes.  She is feeling stronger.  Was up in the chair most the day.  Walked in the hallways 4 times.  Abdomen still somewhat distended but less so and softer.  No pain or guarding.  CAT scan markedly improved with no more suspicious pneumatosis.  There is no narrowing at the anastomosis nor leak.  No abscess.  No obstruction.  Question of some fat necrosis in the pelvis from omentum but I was never really down there so I am skeptical of that.  Not c/w abscess.  Nonetheless does not look like an infection.  No need for antibiotics from surgery standpoint  Try and advance her diet.  Continue supplemental shakes.  Calorie counts.  See if can wean TNA.  Optimize electrolytes  Switch to a different fiber supplement since she is having problems with FiberCon.  Sometimes probiotics have helped so we will try that.  We will do 1 Lomotil since that tends to be her baseline.  Follow-up.  Patient Active Problem List    Diagnosis Date Noted  . Irritable bowel syndrome with diarrhea 11/23/2020    Priority: High  . Cecal cancer & polyps s/p lap hemicolectomy 10/31/2020 10/31/2020    Priority: High  . High grade dysplasia in colonic adenomas transverse colon & splenic flexure 10/31/2020    Priority: Medium  . Decreased estrogen level 11/23/2020  . Hypertension 11/23/2020  . Kidney stone 11/23/2020  . Solitary pulmonary nodule 11/23/2020  . Vitamin D deficiency 11/23/2020  . Hyperlipidemia 11/23/2020  . Hypercalcemia 11/23/2020  . Pressure injury of skin 11/15/2020  . Protein-calorie malnutrition, severe ([place]) 11/11/2020  . Hypophosphatemia 11/03/2020  . Colon cancer ([place]) 10/31/2020  . Colonic mass   . GI bleed 10/27/2020  . Anemia 10/26/2020  . Chronic anticoagulation 02/12/2019  . Venous insufficiency (chronic) (peripheral) 02/12/2019  . Normal coronary arteries 02/12/2019  . Carotid stenosis 03/02/2017  . Paroxysmal atrial fibrillation ([place]) 08/05/2015  . First degree heart block by electrocardiogram 06/03/2014  . Hypokalemia 03/02/2014  . PAC (premature atrial contraction) 05/05/2012  . Hypercholesterolemia 06/13/2011  . Benign hypertensive heart disease without heart failure 12/11/2010    Past Medical History:  Diagnosis Date  . Anxiety   . AV bloc first degree   . Carotid stenosis 03/02/2017  . Hyperlipidemia   . Hypertension   . Lumbar pain    fell off of her horse and has low back pain at times  . Venous insufficiency of both lower extremities   . Vertigo     Past Surgical History:  Procedure  Laterality Date  . ABDOMINAL HYSTERECTOMY    . BIOPSY  10/28/2020   Procedure: BIOPSY;  Surgeon: Milus Banister, MD;  Location: [facility];  Service: Endoscopy;;  . BREAST BIOPSY Left   . BREAST EXCISIONAL BIOPSY Right   . BREAST LUMPECTOMY WITH RADIOACTIVE SEED LOCALIZATION Left 05/05/2015   Procedure: LEFT BREAST LUMPECTOMY WITH RADIOACTIVE SEED LOCALIZATION;  Surgeon: Autumn Messing  III, MD;  Location: [facility];  Service: General;  Laterality: Left;  . CHOLECYSTECTOMY    . COLON RESECTION N/A 10/31/2020   Procedure: LAPAROSCOPIC RIGHT COLON RESECTION, lysis of adhesions, hemicolectomy, bilateral tap block;  Surgeon: [name], MD;  Location: [facility];  Service: General;  Laterality: N/A;  . COLONOSCOPY WITH PROPOFOL N/A 10/28/2020   Procedure: COLONOSCOPY WITH PROPOFOL;  Surgeon: Milus Banister, MD;  Location: [facility];  Service: Endoscopy;  Laterality: N/A;  . EYE SURGERY     bil cataract surgery  . LUNG SURGERY    . POLYPECTOMY  10/28/2020   Procedure: POLYPECTOMY;  Surgeon: Milus Banister, MD;  Location: [facility];  Service: Endoscopy;;  . SUBMUCOSAL TATTOO INJECTION  10/28/2020   Procedure: SUBMUCOSAL TATTOO INJECTION;  Surgeon: Milus Banister, MD;  Location: [facility];  Service: Endoscopy;;    Social History   Socioeconomic History  . Marital status: Married    Spouse name: Not on file  . Number of children: Not on file  . Years of education: Not on file  . Highest education level: Not on file  Occupational History  . Not on file  Tobacco Use  . Smoking status: Never Smoker  . Smokeless tobacco: Never Used  Vaping Use  . Vaping Use: Never used  Substance and Sexual Activity  . Alcohol use: Yes    Comment: social  . Drug use: No  . Sexual activity: Not on file  Other Topics Concern  . Not on file  Social History Narrative  . Not on file   Social Determinants of Health   Financial Resource Strain: Not on file  Food Insecurity: Not on file  Transportation Needs: Not on file  Physical Activity: Not on file  Stress: Not on file  Social Connections: Not on file  Intimate Partner Violence: Not on file    Family History  Problem Relation Age of Onset  . Hypertension Mother   . Stroke Mother   . Heart disease Mother   . Heart disease Father   . Hypertension Sister   . Cancer Sister   . Hypertension Brother    . Hypertension Sister   . Hypertension Sister   . Breast cancer Neg Hx     Current Facility-Administered Medications  Medication Dose Route Frequency Provider Last Rate Last Admin  . (feeding supplement) PROSource Plus liquid 30 mL  30 mL Oral BID BM Lavina Hamman, MD   30 mL at 11/23/20 3557  . acetaminophen (TYLENOL) suppository 650 mg  650 mg Rectal Q6H PRN [name], MD      . acetaminophen (TYLENOL) tablet 650 mg  650 mg Oral Q6H PRN [name], MD      . acidophilus (RISAQUAD) capsule 2 capsule  2 capsule Oral BID [name], MD   2 capsule at 11/23/20 3220  . alum & mag hydroxide-simeth (MAALOX/MYLANTA) 200-200-20 MG/5ML suspension 30 mL  30 mL Oral Q6H PRN [name], MD      . apixaban Arne Cleveland) tablet 5 mg  5 mg Oral BID [name], MD  5 mg at 11/23/20 4696  . artificial tears (LACRILUBE) ophthalmic ointment   Both Eyes Q4H PRN [name], MD      . bisacodyl (DULCOLAX) suppository 10 mg  10 mg Rectal Daily PRN [name], MD      . Chlorhexidine Gluconate Cloth 2 % PADS 6 each  6 each Topical Daily [name], MD   6 each at 11/23/20 0805  . cholestyramine (QUESTRAN) packet 4 g  4 g Oral Gorden Harms, MD      . diltiazem (CARDIZEM CD) 24 hr capsule 180 mg  180 mg Oral Daily [name], MD   180 mg at 11/23/20 2952  . famotidine (PEPCID) 40 MG/5ML suspension 20 mg  20 mg Oral Daily Lavina Hamman, MD   20 mg at 11/23/20 1131  . feeding supplement (BOOST / RESOURCE BREEZE) liquid 1 Container  1 Container Oral TID BM Lavina Hamman, MD   1 Container at 11/23/20 [phone]  . fentaNYL (SUBLIMAZE) injection 25-50 mcg  25-50 mcg Intravenous Q2H PRN [name], MD      . ferrous sulfate tablet 325 mg  325 mg Oral BID WC [name], MD   325 mg at 11/23/20 2440  . furosemide (LASIX) injection 20 mg  20 mg Intravenous Q12H Domenic Polite, MD   20 mg at 11/23/20 1058  . lactobacillus acidophilus & bulgar (LACTINEX) chewable tablet 1 tablet  1 tablet  Oral TID WC Lavina Hamman, MD   1 tablet at 11/23/20 1131  . lip balm (CARMEX) ointment 1 application  1 application Topical BID [name], MD   1 application at 04/13/24 0805  . liver oil-zinc oxide (DESITIN) 40 % ointment   Topical BID [name], MD   Given at 11/23/20 0805  . LORazepam (ATIVAN) injection 0.25 mg  0.25 mg Intravenous BID PRN [name], MD   0.25 mg at 11/16/20 2135  . magic mouthwash  15 mL Oral QID PRN [name], MD      . menthol-cetylpyridinium (CEPACOL) lozenge 3 mg  1 lozenge Oral PRN [name], MD      . methocarbamol (ROBAXIN) 1,000 mg in dextrose 5 % 100 mL IVPB  1,000 mg Intravenous Q8H PRN [name], MD      . metoCLOPramide (REGLAN) tablet 5 mg  5 mg Oral Q6H PRN [name], MD      . metoprolol succinate (TOPROL-XL) 24 hr tablet 100 mg  100 mg Oral Daily Lavina Hamman, MD   100 mg at 11/23/20 3664  . metoprolol tartrate (LOPRESSOR) injection 2.5 mg  2.5 mg Intravenous Q4H PRN [name], MD   2.5 mg at 11/18/20 1405  . ondansetron (ZOFRAN) tablet 4 mg  4 mg Oral Q6H PRN [name], MD       Or  . ondansetron [facility]) injection 4 mg  4 mg Intravenous Q6H PRN [name], MD   4 mg at 11/22/20 2025  . phenol (CHLORASEPTIC) mouth spray 2 spray  2 spray Mouth/Throat PRN [name], MD      . polycarbophil (FIBERCON) tablet 625 mg  625 mg Oral Daily Romana Juniper A, MD   625 mg at 11/23/20 1058  . potassium PHOSPHATE 20 mmol in dextrose 5 % 500 mL infusion  20 mmol Intravenous Once Dimple Nanas, RPH 84 mL/hr at 11/23/20 1132 20 mmol at 11/23/20 1132  . prochlorperazine (COMPAZINE) tablet 10 mg  10 mg Oral Q6H PRN [name], MD  Or  . prochlorperazine (COMPAZINE) injection 5-10 mg  5-10 mg Intravenous Q6H PRN [name], MD   10 mg at 11/10/20 0854  . protein supplement (RESOURCE BENEPROTEIN) powder packet 6 g  1 Scoop Oral TID WC Lavina Hamman, MD      . simethicone (MYLICON) 40 IR/4.8NI suspension 40 mg   40 mg Oral QID PRN [name], MD      . sodium chloride flush (NS) 0.9 % injection 10-40 mL  10-40 mL Intracatheter Gorden Harms, MD   10 mL at 11/21/20 1100  . sodium chloride flush (NS) 0.9 % injection 10-40 mL  10-40 mL Intracatheter PRN [name], MD   10 mL at 11/23/20 0505  . TPN ADULT (ION)   Intravenous Continuous TPN Dimple Nanas, RPH      . traMADol Veatrice Bourbon) tablet 50-100 mg  50-100 mg Oral Q6H PRN [name], MD         Allergies  Allergen Reactions  . Iodinated Diagnostic Agents Shortness Of Breath  . Amlodipine     Shortness of breath  . Codeine Nausea Only  . Diphenhydramine Other (See Comments)    hypertension  . Diphenhydramine Hcl     Hypertension   . Pravastatin     Muscle aches  . Zetia [Ezetimibe]   . Sulfa Antibiotics Rash  . Sulfa Drugs Cross Reactors Rash    BP (!) 136/59 (BP Location: Left Arm)   Pulse 86   Temp 98.3 F (36.8 C) (Oral)   Resp 18   Ht 5\' 4"  (1.626 m)   Wt 62.2 kg   SpO2 96%   BMI 23.54 kg/m   CT ABDOMEN PELVIS WO CONTRAST  Result Date: 11/21/2020 CLINICAL DATA:  Bowel obstruction suspected. Ileus. Laparoscopic hemicolectomy 10/31/2020. EXAM: CT ABDOMEN AND PELVIS WITHOUT CONTRAST TECHNIQUE: Multidetector CT imaging of the abdomen and pelvis was performed following the standard protocol without IV contrast. COMPARISON:  Postoperative CT 11/10/2020. Preoperative CT 10/28/2020 FINDINGS: Lower chest: Resolved left basilar opacity from prior exam. Similar appearing reticulation and nodularity in the anterior right lower and right middle lobes appear to represent scarring. Hepatobiliary: No evidence of focal liver abnormality on this noncontrast exam. Previous portal venous gas has resolved. Cholecystectomy. No definite biliary dilatation, common bile duct not well visualized. Pancreas: Mild parenchymal atrophy. No ductal dilatation or inflammation. Spleen: Normal in size without focal abnormality. Adrenals/Urinary Tract:  No adrenal nodule. No hydronephrosis or perinephric edema. Left peripelvic and right renal cortical cyst. The urinary bladder is near completely empty and not well assessed. Stomach/Bowel: No abnormal gastric distension. Normal positioning of the duodenum and ligament of Treitz. Status post right hemicolectomy with enteric sutures in the mid transverse colon. Small bowel loops are diffusely dilated and contrast filled, however enteric contrast reaches the rectosigmoid colon, no obstruction occasional areas of small bowel thickening, including in the right lower quadrant adjacent to an area of presumed fat necrosis, for example coronal series 4, image 34. the previous small bowel pneumatosis is no longer seen. Vascular/Lymphatic: Aortic atherosclerosis. No aortic aneurysm. There is no portal venous or mesenteric gas. No bulky abdominopelvic adenopathy. Reproductive: Uterus is surgically absent. There is no adnexal mass. Other: There is a rounded area of fatty inflammation in the pelvis just to the right of midline with small amount of free fluid suspicious for fat necrosis. This spans approximately 6.2 cm, series 2, image 64. This is more coalescent than seen on prior exam. There is a small volume of free  fluid in the posterior pelvis but no loculated collection. No free air. Minimal subcutaneous edema. Musculoskeletal: Chronic T12, L2 and minimal L3 superior endplate compression fractures. Multilevel degenerative change in the spine. No acute osseous abnormalities are seen. IMPRESSION: 1. Post right hemicolectomy with enteric sutures in the mid transverse colon. Small bowel is dilated, however no evidence of obstruction with enteric contrast reaching the rectosigmoid colon. Previous small bowel pneumatosis and portal venous gas has resolved. No evidence of bowel obstruction. 2. Occasional areas of small bowel thickening in the pelvis adjacent to presumed fat necrosis described below. 3. A rounded area of fatty  inflammation in the pelvis just to the right of midline with small amount of free fluid suspicious for fat necrosis. This is more coalescent than on prior exam. 4. Small volume of free fluid in the posterior pelvis but no loculated collection/abscess. No free air. Aortic Atherosclerosis (ICD10-I70.0). Electronically Signed   By: Keith Rake M.D.   On: 11/21/2020 19:32   CT ABDOMEN PELVIS WO CONTRAST  Addendum Date: 11/10/2020   ADDENDUM REPORT: 11/10/2020 16:01 ADDENDUM: Critical Value/emergent results were called by telephone at the time of interpretation on 11/10/2020 at 4:01 pm to provider [facility] , who verbally acknowledged these results. Electronically Signed   By: Marijo Conception M.D.   On: 11/10/2020 16:01   Result Date: 11/10/2020 CLINICAL DATA:  Abdominal distension, vomiting. Status post colon resection. EXAM: CT ABDOMEN AND PELVIS WITHOUT CONTRAST TECHNIQUE: Multidetector CT imaging of the abdomen and pelvis was performed following the standard protocol without IV contrast. COMPARISON:  Oct 28, 2020. FINDINGS: Lower chest: Left basilar subsegmental atelectasis or infiltrate is noted. Hepatobiliary: There appears to be left intrahepatic portal venous gas. Status post cholecystectomy. No biliary dilatation. Pancreas: Unremarkable. No pancreatic ductal dilatation or surrounding inflammatory changes. Spleen: Normal in size without focal abnormality. Adrenals/Urinary Tract: Adrenal glands appear normal. Stable large right renal cyst is noted. No hydronephrosis or renal obstruction is noted. No renal or ureteral calculi are noted. Urinary bladder is unremarkable. Stomach/Bowel: Status post right colectomy. Moderate gastric distention is noted as well as severe small bowel dilatation concerning for distal small bowel obstruction. There appears to be intramural gas involving portions of the dilated small bowel concerning for possible ischemic bowel. Vascular/Lymphatic: Aortic atherosclerosis. No  enlarged abdominal or pelvic lymph nodes. Reproductive: Status post hysterectomy. No adnexal masses. Other: No abdominal wall hernia or abnormality. No abdominopelvic ascites. Musculoskeletal: No acute or significant osseous findings. IMPRESSION: Severely dilated small bowel loops are noted as well as moderate gastric distension concerning for distal small bowel obstruction. There also appears to be intramural gas involving portions of the dilated small bowel concerning for ischemic bowel. Also noted is a small amount of left intrahepatic portal venous gas, also suggesting ischemic bowel. Left basilar subsegmental atelectasis or infiltrate is noted. Status post right colectomy. Aortic Atherosclerosis (ICD10-I70.0). Electronically Signed: By: Marijo Conception M.D. On: 11/10/2020 15:54   DG Chest 2 View  Result Date: 10/26/2020 CLINICAL DATA:  Shortness of breath.  Fatigue. EXAM: CHEST - 2 VIEW COMPARISON:  Radiograph 08/19/2015.  CT 12/16/2017 FINDINGS: Upper normal heart size with normal mediastinal contours. No pulmonary edema, pleural effusion, or pneumothorax. No focal airspace disease. Mild biapical pleuroparenchymal scarring. Pulmonary nodules on prior CT are not well seen by radiograph. Chronic lower thoracic compression fracture, unchanged. IMPRESSION: No acute chest findings. Electronically Signed   By: Keith Rake M.D.   On: 10/26/2020 19:43   DG Abd 1 View  Result Date: 11/11/2020 CLINICAL DATA:  Check gastric catheter placement EXAM: ABDOMEN - 1 VIEW COMPARISON:  11/10/2020 FINDINGS: Gastric catheter is been further advanced into the stomach and is coiled within the stomach. Right-sided PICC line is noted in satisfactory position. Cardiac shadow is stable. Stable airspace opacities in the bases similar to that noted on the prior exam. IMPRESSION: Gastric catheter coiled within the stomach. Electronically Signed   By: Inez Catalina M.D.   On: 11/11/2020 15:23   DG Abd 1 View  Result Date:  11/10/2020 CLINICAL DATA:  NG tube placement. EXAM: ABDOMEN - 1 VIEW COMPARISON:  Earlier same day FINDINGS: 2101 hours. NG tube tip is in the mid stomach. Gaseous distention of bowel noted in the visualized upper abdomen. IMPRESSION: NG tube tip is in the mid stomach. Electronically Signed   By: Misty Stanley M.D.   On: 11/10/2020 21:32   DG Abd 1 View  Result Date: 11/02/2020 CLINICAL DATA:  NG tube placement EXAM: ABDOMEN - 1 VIEW COMPARISON:  11/02/2020 FINDINGS: Esophageal tube tip and side port overlie the proximal stomach. Partially visualized dilated air distended bowel. IMPRESSION: Esophageal tube tip and side port overlie the proximal stomach Electronically Signed   By: Donavan Foil M.D.   On: 11/02/2020 20:20   MR LIVER W WO CONTRAST  Result Date: 10/29/2020 CLINICAL DATA:  Colorectal cancer staging EXAM: MRI ABDOMEN WITHOUT AND WITH CONTRAST TECHNIQUE: Multiplanar multisequence MR imaging of the abdomen was performed both before and after the administration of intravenous contrast. CONTRAST:  69mL GADAVIST GADOBUTROL 1 MMOL/ML IV SOLN COMPARISON:  10/28/2020 FINDINGS: Examination is generally somewhat limited by breath motion artifact throughout. Lower chest: No acute findings. Hepatobiliary: Hepatic iron deposition. No mass or other parenchymal abnormality identified. Status post cholecystectomy. No biliary ductal dilatation. Pancreas: No mass, inflammatory changes, or other parenchymal abnormality identified. Spleen:  Splenic iron deposition. Adrenals/Urinary Tract: No masses identified. Large, benign exophytic cyst arising from the inferior pole of the right kidney measuring at least 11.0 cm (series 5, image 29). Multiple additional small cortical and parapelvic cysts. No hydronephrosis. Stomach/Bowel: Visualized portions within the abdomen are unremarkable. Vascular/Lymphatic: No pathologically enlarged lymph nodes identified. No abdominal aortic aneurysm demonstrated. Aortic atherosclerosis.  Other:  None. Musculoskeletal: No suspicious bone lesions identified. Bone marrow iron deposition. IMPRESSION: 1. Examination is generally somewhat limited by breath motion artifact. Within this limitation, no evidence of lymphadenopathy or metastatic disease in the abdomen. 2. Hepatic, splenic, and bone marrow iron deposition. Correlate for history of transfusion or other etiology of iron overload. 3. Status post cholecystectomy. Electronically Signed   By: Eddie Candle M.D.   On: 10/29/2020 13:11   DG CHEST PORT 1 VIEW  Result Date: 11/13/2020 CLINICAL DATA:  Increased respiratory rate EXAM: PORTABLE CHEST 1 VIEW COMPARISON:  Nov 04, 2020 FINDINGS: The cardiomediastinal silhouette is unchanged in contour.Removal of enteric tube. RIGHT upper extremity PICC tip terminates over the superior cavoatrial junction. Atherosclerotic calcifications. Biapical scarring. No pleural effusion. No pneumothorax. Bilateral hazy opacities, similar in comparison to prior. Visualized abdomen is unremarkable. Multilevel degenerative changes of the thoracic spine. Compression fracture of the inferior thoracic spine, unchanged. IMPRESSION: Similar appearance of hazy bilateral opacities. Differential considerations include infection, pulmonary edema or aspiration Electronically Signed   By: Valentino Saxon MD   On: 11/13/2020 16:16   DG CHEST PORT 1 VIEW  Result Date: 11/12/2020 CLINICAL DATA:  86 year old female with increased work of breathing. EXAM: PORTABLE CHEST - 1 VIEW COMPARISON:  11/11/2020 FINDINGS:  The mediastinal contours are within normal limits. No cardiomegaly. Slight interval increased conspicuity of hazy, streaky opacities in the right upper lobe. Similar appearing patchy, hazy opacities in the right lower lobe. Unchanged lingular opacities. No evidence of significant pleural effusion. No pneumothorax. Right upper extremity PICC terminates at the cavoatrial junction, unchanged. Enteric feeding tube courses off  the inferior aspect of this image, however the tip is visualized in the gastric fundus. Similar-appearing atherosclerotic calcifications of the aortic arch. No acute osseous abnormality. IMPRESSION: Slight interval increased conspicuity of previously visualized right upper and lower lobe hazy pulmonary opacities. These findings could be seen with developing multifocal pneumonia, asymmetric pulmonary edema, or postobstructive atelectasis. Electronically Signed   By: Ruthann Cancer MD   On: 11/12/2020 14:12   DG CHEST PORT 1 VIEW  Result Date: 11/11/2020 CLINICAL DATA:  Status post PICC line placement EXAM: PORTABLE CHEST 1 VIEW COMPARISON:  10/26/2020 chest radiograph and 10/28/2020 CT FINDINGS: Right-sided PICC line terminates at the low SVC. Midline trachea. Mild cardiomegaly. Atherosclerosis in the transverse aorta. No pleural effusion or pneumothorax. Diffuse interstitial thickening. Suspect developing more confluent inferior right upper and bibasilar patchy airspace opacities. IMPRESSION: Right-sided PICC line tip at low SVC, without pneumothorax. Suspicion of developing pulmonary opacities, superimposed upon chronic interstitial thickening. Correlate with infectious symptoms to exclude early pneumonia. Aortic Atherosclerosis (ICD10-I70.0). Electronically Signed   By: Abigail Miyamoto M.D.   On: 11/11/2020 10:55   DG Abd 2 Views  Result Date: 11/21/2020 CLINICAL DATA:  Small-bowel obstruction EXAM: ABDOMEN - 2 VIEW COMPARISON:  November 18, 2020 FINDINGS: Supine and upright images were obtained. There remain multiple loops of dilated small bowel with occasional air-fluid levels. No free air appreciable. Surgical clips in gallbladder fossa region. Lower lung regions are clear. IMPRESSION: Multiple loops of dilated small bowel with occasional air-fluid levels. Differential considerations include bowel obstruction and ileus. No evident free air. Visualized lower lung regions clear. Electronically Signed   By: Lowella Grip III M.D.   On: 11/21/2020 08:02   DG Abd 2 Views  Result Date: 11/10/2020 CLINICAL DATA:  Vomiting, abdominal distension EXAM: ABDOMEN - 2 VIEW COMPARISON:  None. FINDINGS: Two view radiograph the abdomen demonstrates multiple gas-filled dilated loops of small-bowel throughout the abdomen demonstrating air-fluid levels at multiple levels. There is a relative paucity of gas within the colon. Together the findings are in keeping with a distal small bowel obstruction. No free intraperitoneal gas. Cholecystectomy clips seen in the right upper quadrant. IMPRESSION: Distal small bowel obstruction. Electronically Signed   By: Fidela Salisbury MD   On: 11/10/2020 01:15   DG Abd Portable 1V  Result Date: 11/18/2020 CLINICAL DATA:  Ileus following gastrointestinal surgery, continued abdominal pain and distension EXAM: PORTABLE ABDOMEN - 1 VIEW COMPARISON:  11/16/2020 FINDINGS: Persistent marked gaseous distention of small bowel loops favor postoperative ileus though small-bowel obstruction is not entirely excluded. Minimal colonic gas and stool. No bowel wall thickening. Bones demineralized with biconvex thoracolumbar scoliosis. IMPRESSION: Persistent gaseous distension of small bowel loops favor postoperative ileus though obstruction not entirely excluded. Electronically Signed   By: Lavonia Dana M.D.   On: 11/18/2020 14:09   DG Abd Portable 1V  Result Date: 11/16/2020 CLINICAL DATA:  Cecal cancer with hemicolectomy 10/31/2020.  Nausea. EXAM: PORTABLE ABDOMEN - 1 VIEW COMPARISON:  11/11/2020 FINDINGS: NG tube removed. Mildly distended bowel loops predominately small bowel. Upright view not obtained evaluate for free air Surgical clips in the gallbladder fossa. Lumbar scoliosis and disc degeneration.  Mild atherosclerotic aorta. IMPRESSION: Mildly distended small bowel loops may represent small-bowel obstruction or ileus. Electronically Signed   By: Franchot Gallo M.D.   On: 11/16/2020 15:16   DG Abd Portable  1V  Result Date: 11/02/2020 CLINICAL DATA:  Ileus. EXAM: PORTABLE ABDOMEN - 1 VIEW COMPARISON:  Oct 28, 2020. FINDINGS: Status post cholecystectomy. No colonic dilatation. Dilated small bowel loops are seen centrally and in the right abdomen concerning for distal small bowel obstruction or ileus. No abnormal calcifications are noted IMPRESSION: Dilated small bowel loops are noted concerning for distal small bowel obstruction or ileus. Electronically Signed   By: Marijo Conception M.D.   On: 11/02/2020 10:30   ECHOCARDIOGRAM COMPLETE  Result Date: 11/15/2020    ECHOCARDIOGRAM REPORT   Patient Name:   Robin [name] Date of Exam: 11/15/2020 Medical Rec #:  [id number]          Height:       64.0 in Accession #:    [id number]         Weight:       135.1 lb Date of Birth:  [birth year]         BSA:          1.656 m Patient Age:    14 years           BP:           134/62 mmHg Patient Gender: F                  HR:           84 bpm. Exam Location:  Inpatient Procedure: 2D Echo Indications:    CHF-Acute Diastolic H47.42  History:        Patient has prior history of Echocardiogram examinations, most                 recent 08/28/2018. Arrythmias:Atrial Fibrillation; Risk                 Factors:Hypertension and Dyslipidemia. First Degree Heart Block,                 Colon cancer.  Sonographer:    Leavy Cella Referring Phys: [id number] RAVI PAHWANI IMPRESSIONS  1. Left ventricular ejection fraction, by estimation, is 60 to 65%. The left ventricle has normal function. The left ventricle has no regional wall motion abnormalities. Left ventricular diastolic parameters are indeterminate.  2. Right ventricular systolic function is normal. The right ventricular size is normal. There is normal pulmonary artery systolic pressure. The estimated right ventricular systolic pressure is 56.4 mmHg.  3. Right atrial size was mildly dilated.  4. The mitral valve is normal in structure. Trivial mitral valve regurgitation.  5. The aortic  valve was not well visualized. Aortic valve regurgitation is not visualized. No aortic stenosis is present.  6. The inferior vena cava is normal in size with greater than 50% respiratory variability, suggesting right atrial pressure of 3 mmHg. FINDINGS  Left Ventricle: Left ventricular ejection fraction, by estimation, is 60 to 65%. The left ventricle has normal function. The left ventricle has no regional wall motion abnormalities. The left ventricular internal cavity size was small. There is no left ventricular hypertrophy. Left ventricular diastolic parameters are indeterminate. Right Ventricle: The right ventricular size is normal. No increase in right ventricular wall thickness. Right ventricular systolic function is normal. There is normal pulmonary artery systolic pressure. The tricuspid regurgitant velocity is 2.55 m/s, and  with an assumed  right atrial pressure of 3 mmHg, the estimated right ventricular systolic pressure is 09.4 mmHg. Left Atrium: Left atrial size was normal in size. Right Atrium: Right atrial size was mildly dilated. Pericardium: There is no evidence of pericardial effusion. Mitral Valve: The mitral valve is normal in structure. Trivial mitral valve regurgitation. Tricuspid Valve: The tricuspid valve is normal in structure. Tricuspid valve regurgitation is trivial. Aortic Valve: The aortic valve was not well visualized. Aortic valve regurgitation is not visualized. No aortic stenosis is present. Pulmonic Valve: The pulmonic valve was not well visualized. Pulmonic valve regurgitation is not visualized. Aorta: The aortic root and ascending aorta are structurally normal, with no evidence of dilitation. Venous: The inferior vena cava is normal in size with greater than 50% respiratory variability, suggesting right atrial pressure of 3 mmHg. IAS/Shunts: The interatrial septum was not well visualized.  LEFT VENTRICLE PLAX 2D LVIDd:         3.53 cm  Diastology LVIDs:         2.19 cm  LV e'  medial:    8.70 cm/s LV PW:         0.90 cm  LV E/e' medial:  13.1 LV IVS:        0.95 cm  LV e' lateral:   10.80 cm/s LVOT diam:     1.80 cm  LV E/e' lateral: 10.6 LVOT Area:     2.54 cm  RIGHT VENTRICLE RV S prime:     13.70 cm/s TAPSE (M-mode): 1.4 cm LEFT ATRIUM             Index       RIGHT ATRIUM           Index LA diam:        3.50 cm 2.11 cm/m  RA Area:     17.70 cm LA Vol (A2C):   46.1 ml 27.84 ml/m RA Volume:   46.20 ml  27.90 ml/m LA Vol (A4C):   29.1 ml 17.57 ml/m LA Biplane Vol: 37.9 ml 22.88 ml/m   AORTA Ao Root diam: 2.70 cm MITRAL VALVE                TRICUSPID VALVE MV Area (PHT): 3.53 cm     TR Peak grad:   26.0 mmHg MV Decel Time: 215 msec     TR Vmax:        255.00 cm/s MV E velocity: 114.00 cm/s MV A velocity: 43.70 cm/s   SHUNTS MV E/A ratio:  2.61         Systemic Diam: 1.80 cm Oswaldo Milian MD Electronically signed by Oswaldo Milian MD Signature Date/Time: 11/15/2020/2:37:09 PM    Final    [place] EKG SITE RITE  Result Date: 11/10/2020 If Site Rite image not attached, placement could not be confirmed due to current cardiac rhythm.  CT CHEST ABDOMEN PELVIS WO CONTRAST  Result Date: 10/29/2020 CLINICAL DATA:  Colorectal carcinoma.  Staging. EXAM: CT CHEST, ABDOMEN AND PELVIS WITHOUT CONTRAST TECHNIQUE: Multidetector CT imaging of the chest, abdomen and pelvis was performed following the standard protocol without IV contrast. COMPARISON:  CT chest 12/16/2017 and CT AP 03/08/2014 FINDINGS: CT CHEST FINDINGS Cardiovascular: The heart size appears within normal limits. Aortic atherosclerosis. Coronary artery calcifications. No pericardial effusion. Mediastinum/Nodes: No enlarged mediastinal, hilar, or axillary lymph nodes. Thyroid gland, trachea, and esophagus demonstrate no significant findings. Lungs/Pleura: No pleural effusion, airspace consolidation, or pneumothorax. Again seen are chronic parenchymal changes likely due to indolent atypical infection such  as MAI. This  includes bilateral areas scarring, peripheral tree-in-bud nodularity architectural distortion, cylindrical bronchiectasis and volume loss. This predominantly affects the right middle lobe and lingula as well as the anterior right lower lobe. Imaging findings appears similar to the previous exam. Scattered solid round nodules are identified, which, although nonspecific, are likely the sequelae of chronic indolent infection and appears similar to previous exam. These include: -4 mm peripheral nodule in the right lower lobe, image 95/4. This is unchanged from previous exam. Two adjacent solid-appearing nodules are also unchanged from the previous exam, image 93/4. -within the anterolateral left lower lobe there is a nodule measuring 4 mm, image 119/4. Unchanged from previous exam. No new suspicious lung nodules identified. Musculoskeletal: No chest wall mass or suspicious bone lesions identified. There is remote superior endplate deformity involving T11 and T12. Unchanged from previous exam. CT ABDOMEN PELVIS FINDINGS Hepatobiliary: Within the limitations of unenhanced technique there is no focal liver abnormality. Previous cholecystectomy. No biliary dilatation. Pancreas: Unremarkable. No pancreatic ductal dilatation or surrounding inflammatory changes. Spleen: Normal in size without focal abnormality. Adrenals/Urinary Tract: Normal appearance of the adrenal glands. Large right kidney cyst measures 10.5 cm. No hydronephrosis or nephrolithiasis identified bilaterally. Urinary bladder appears normal for degree of distension. Stomach/Bowel: Stomach is within normal limits. The appendix is difficult to visualize within its entirety separate from right lower quadrant bowel loops. No secondary signs of acute appendicitis however no bowel wall thickening, inflammation, or distension. The soft tissue lesion near the ileocecal valve is identified and measures approximately 2.9 cm, image 101/2 and image 31/5. Multiple small  ileocolic lymph nodes are identified which measure up to 7 mm. Vascular/Lymphatic: Aortic atherosclerosis. No enlarged abdominal or pelvic lymph nodes. Reproductive: Status post hysterectomy. No adnexal masses. Other: No free fluid or fluid collections. Musculoskeletal: Degenerative disc disease identified within the thoracolumbar spine. IMPRESSION: 1. Masslike soft tissue lesion near the ileocecal valve is identified compatible with primary colonic neoplasm. Small ileocolic lymph nodes are identified which measure up to 7 mm. No findings of solid organ metastasis or nodal metastasis within the chest, abdomen or pelvis. 2. Chronic parenchymal changes within both lungs likely due to indolent atypical infection such as MAI. 3. Aortic atherosclerosis and coronary artery calcifications. Aortic Atherosclerosis (ICD10-I70.0). Electronically Signed   By: Kerby Moors M.D.   On: 10/29/2020 06:45

## 2020-11-23 NOTE — Plan of Care (Signed)
Plan of care reviewed and discussed with the patient. 

## 2020-11-24 LAB — CBC
HCT: 30.8 % — ABNORMAL LOW (ref 36.0–46.0)
Hemoglobin: 9.4 g/dL — ABNORMAL LOW (ref 12.0–15.0)
MCH: 24.9 pg — ABNORMAL LOW (ref 26.0–34.0)
MCHC: 30.5 g/dL (ref 30.0–36.0)
MCV: 81.5 fL (ref 80.0–100.0)
Platelets: 351 10*3/uL (ref 150–400)
RBC: 3.78 MIL/uL — ABNORMAL LOW (ref 3.87–5.11)
RDW: 27.1 % — ABNORMAL HIGH (ref 11.5–15.5)
WBC: 8 10*3/uL (ref 4.0–10.5)
nRBC: 0 % (ref 0.0–0.2)

## 2020-11-24 LAB — COMPREHENSIVE METABOLIC PANEL
ALT: 21 U/L (ref 0–44)
AST: 20 U/L (ref 15–41)
Albumin: 2.6 g/dL — ABNORMAL LOW (ref 3.5–5.0)
Alkaline Phosphatase: 175 U/L — ABNORMAL HIGH (ref 38–126)
Anion gap: 6 (ref 5–15)
BUN: 21 mg/dL (ref 8–23)
CO2: 25 mmol/L (ref 22–32)
Calcium: 9.2 mg/dL (ref 8.9–10.3)
Chloride: 105 mmol/L (ref 98–111)
Creatinine, Ser: 0.56 mg/dL (ref 0.44–1.00)
GFR, Estimated: >60 mL/min (ref >60)
Glucose, Bld: 126 mg/dL — ABNORMAL HIGH (ref 70–99)
Potassium: 3.7 mmol/L (ref 3.5–5.1)
Sodium: 136 mmol/L (ref 135–145)
Total Bilirubin: 0.9 mg/dL (ref 0.3–1.2)
Total Protein: 5.6 g/dL — ABNORMAL LOW (ref 6.5–8.1)

## 2020-11-24 LAB — GLUCOSE, CAPILLARY
Glucose-Capillary: 130 mg/dL — ABNORMAL HIGH (ref 70–99)
Glucose-Capillary: 115 mg/dL — ABNORMAL HIGH (ref 70–99)
Glucose-Capillary: 121 mg/dL — ABNORMAL HIGH (ref 70–99)
Glucose-Capillary: 97 mg/dL (ref 70–99)

## 2020-11-24 LAB — MAGNESIUM: Magnesium: 2.2 mg/dL (ref 1.7–2.4)

## 2020-11-24 LAB — PHOSPHORUS: Phosphorus: 2.9 mg/dL (ref 2.5–4.6)

## 2020-11-24 MED ORDER — FUROSEMIDE 10 MG/ML IJ SOLN
20.0000 mg | Freq: Once | INTRAMUSCULAR | Status: AC
  Administered 2020-11-24: 20 mg via INTRAVENOUS
  Filled 2020-11-24: qty 2

## 2020-11-24 MED ORDER — K PHOS MONO-SOD PHOS DI & MONO 155-852-130 MG PO TABS
500.0000 mg | ORAL_TABLET | ORAL | Status: AC
  Administered 2020-11-24: 500 mg via ORAL
  Filled 2020-11-24 (×2): qty 2

## 2020-11-24 MED ORDER — ENSURE MAX PROTEIN PO LIQD
11.0000 [oz_av] | Freq: Every day | ORAL | Status: DC
  Administered 2020-11-25 – 2020-11-27 (×3): 11 [oz_av] via ORAL

## 2020-11-24 MED ORDER — POTASSIUM CHLORIDE CRYS ER 20 MEQ PO TBCR
40.0000 meq | EXTENDED_RELEASE_TABLET | Freq: Once | ORAL | Status: AC
  Administered 2020-11-24: 40 meq via ORAL
  Filled 2020-11-24: qty 2

## 2020-11-24 MED ORDER — ENSURE ENLIVE PO LIQD
237.0000 mL | Freq: Two times a day (BID) | ORAL | Status: DC
  Administered 2020-11-25 – 2020-11-27 (×3): 237 mL via ORAL

## 2020-11-24 NOTE — Progress Notes (Signed)
PHARMACY - TOTAL PARENTERAL NUTRITION CONSULT NOTE  Indication: Small bowel obstruction; anticipate prolonged NPO status  Patient Measurements: Height: 5' 4" (162.6 cm) Weight: 63.2 kg (139 lb 5.3 oz) IBW/kg (Calculated) : 54.7 TPN AdjBW (KG): 65.4 Body mass index is 23.92 kg/m. Usual Weight: 66 kg (~10 lb weight loss this admission)  Assessment:  22 yoF with PMH Afib admitted 5/11 with anemia; found to have bleeding colonic mass suspicious for malignancy and underwent hemicolectomy 5/16. Developed ileus POD3 which initially improved, then worsened POD10. CT showed massive SBO and no immediate indication for surgery, so Pharmacy consulted to begin TPN for expected prolonged ileus.  Glucose / Insulin: no hx DM - CBGs well controlled.  SSI D/C'ed. Electrolytes: K 3.7 (goal >/= 4) and Phos up to 2.9 post KPhos 36mol and KCL 471m PO (also received Lasix 209mV), others WNL (Na low normal, CoCa high normal) Renal: SCr < 1, BUN WNL Hepatic: LFTs WNL except alk phos at 175, tbili / TG WNL, albumin 2.6, prealbumin low at 15.1 I/O: no UOP charted, BM x6 GI Imaging: - 5/13 colonoscopy: multiple polyps; larger masses at cecum and transverse colon - 5/14 MRI liver: no metastatic dz - 5/18 AXR: dilated SB loops; possible ileus - 5/26 CTa/p: severely dilated SB loops (not including ileocolonic anastomosis) concerning for distal SBO, also intramural and intrahepatic portal venous gas suggesting possible ischemic bowel - 5/31 mildly distended bowel loops: SBO vs ileus - 6/6 CT A/P: small bowel dilated but no evidence of SBO, prev small bowel pneumatosis resolved Surgeries / Procedures:  - 5/16 ex-lap hemicolectomy/cecectomy; omentopexy of ileocolonic anastomosis  Central access: PICC placed 5/27 TPN start date: 5/27  Nutritional Goals (per RD rec on 6/6): 1700-1900 kCal, 80-95g AA, >/= 2L fluid per day TPN at goal rate of 75 mL/hr provides 90 g of protein and 1818 kcals per day  Current  Nutrition:  TPN - consult to taper 6/8 Heart diet - consume up to 50% of meals Prosource Plus BID - 1 charted given yesterday Resource Breeze TID - 1 charted given yesterday Beneprotein TID - none charted given yesterday  Plan: Stop TPN per Surgery - reduce TPN to 20 ml/hr, then stop at 1800 NeutraPhos 500m77m x 2 doses KCL 40mE29m x 1 D/C TPN labs and nursing care orders   Darran Gabay D. Yaquelin Langelier,Mina MarblermD, BCPS, BCCCPLake Alman[place]2022, 10:05 AM

## 2020-11-24 NOTE — Progress Notes (Signed)
Progress Note  24 Days Post-Op  Subjective: Tolerating soft diet well with minimal nausea only if she eats too much.  Had 6 stools yesterday, but then had 8 apparently from 2am -8am, but none since then.  Walking well.  Objective: Vital signs in last 24 hours: Temp:  [97.5 F (36.4 C)-98.1 F (36.7 C)] 98.1 F (36.7 C) (06/09 0544) Pulse Rate:  [66-89] 89 (06/09 0544) Resp:  [16-18] 18 (06/09 0544) BP: (112-127)/(58-70) 112/70 (06/09 0544) SpO2:  [98 %-100 %] 99 % (06/09 0544) Weight:  [63.2 kg] 63.2 kg (06/09 0500) Last BM Date: 11/23/20  Intake/Output from previous day: 06/08 0701 - 06/09 0700 In: 1509.5 [P.O.:720; I.V.:491.3; IV Piggyback:298.2] Out: 0  Intake/Output this shift: No intake/output data recorded.  PE: Abd: soft, NT, some distention, but less today than previous days, +BS, incisions c/d/i   Lab Results:  Recent Labs    11/23/20 0459 11/24/20 0320  WBC 8.8 8.0  HGB 9.3* 9.4*  HCT 31.2* 30.8*  PLT 364 351   BMET Recent Labs    11/23/20 0459 11/24/20 0320  NA 139 136  K 3.8 3.7  CL 109 105  CO2 25 25  GLUCOSE 132* 126*  BUN 27* 21  CREATININE 0.50 0.56  CALCIUM 8.8* 9.2   PT/INR No results for input(s): LABPROT, INR in the last 72 hours. CMP     Component Value Date/Time   NA 136 11/24/2020 0320   NA 139 02/21/2018 0854   K 3.7 11/24/2020 0320   CL 105 11/24/2020 0320   CO2 25 11/24/2020 0320   GLUCOSE 126 (H) 11/24/2020 0320   BUN 21 11/24/2020 0320   BUN 20 02/21/2018 0854   CREATININE 0.56 11/24/2020 0320   CREATININE 0.54 (L) 11/28/2015 1422   CALCIUM 9.2 11/24/2020 0320   PROT 5.6 (L) 11/24/2020 0320   ALBUMIN 2.6 (L) 11/24/2020 0320   AST 20 11/24/2020 0320   ALT 21 11/24/2020 0320   ALKPHOS 175 (H) 11/24/2020 0320   BILITOT 0.9 11/24/2020 0320   GFRNONAA >60 11/24/2020 0320   GFRAA 78 02/21/2018 0854   Lipase     Component Value Date/Time   LIPASE 31 10/26/2020 1919       Studies/Results: No results  found.   Anti-infectives: Anti-infectives (From admission, onward)    Start     Dose/Rate Route Frequency Ordered Stop   11/16/20 1200  Ampicillin-Sulbactam (UNASYN) 3 g in sodium chloride 0.9 % 100 mL IVPB        3 g 200 mL/hr over 30 Minutes Intravenous Every 6 hours 11/16/20 0755 11/17/20 0840   11/15/20 1200  Ampicillin-Sulbactam (UNASYN) 3 g in sodium chloride 0.9 % 100 mL IVPB  Status:  Discontinued        3 g 200 mL/hr over 30 Minutes Intravenous Every 6 hours 11/15/20 1055 11/16/20 0755   11/11/20 1800  anidulafungin (ERAXIS) 100 mg in sodium chloride 0.9 % 100 mL IVPB  Status:  Discontinued       See Hyperspace for full Linked Orders Report.   100 mg 78 mL/hr over 100 Minutes Intravenous Every 24 hours 11/10/20 1622 11/15/20 0815   11/10/20 2200  piperacillin-tazobactam (ZOSYN) IVPB 3.375 g  Status:  Discontinued        3.375 g 12.5 mL/hr over 240 Minutes Intravenous Every 8 hours 11/10/20 1610 11/15/20 1035   11/10/20 1800  anidulafungin (ERAXIS) 200 mg in sodium chloride 0.9 % 200 mL IVPB  See Hyperspace for full Linked Orders Report.   200 mg 78 mL/hr over 200 Minutes Intravenous  Once 11/10/20 1622 11/11/20 0016   11/10/20 1700  anidulafungin (ERAXIS) 100 mg in sodium chloride 0.9 % 100 mL IVPB  Status:  Discontinued        100 mg 78 mL/hr over 100 Minutes Intravenous Every 24 hours 11/10/20 1610 11/10/20 1621   11/10/20 1700  piperacillin-tazobactam (ZOSYN) IVPB 3.375 g        3.375 g 100 mL/hr over 30 Minutes Intravenous  Once 11/10/20 1618 11/10/20 1720   11/08/20 0045  vancomycin (VANCOCIN) 50 mg/mL oral solution 125 mg  Status:  Discontinued        125 mg Oral Every 6 hours 11/07/20 2354 11/08/20 1304   10/31/20 2200  cefoTEtan (CEFOTAN) 2 g in sodium chloride 0.9 % 100 mL IVPB        2 g 200 mL/hr over 30 Minutes Intravenous Every 12 hours 10/31/20 1744 10/31/20 2205   10/31/20 0915  cefoTEtan (CEFOTAN) 2 g in sodium chloride 0.9 % 100 mL IVPB        2  g 200 mL/hr over 30 Minutes Intravenous On call to O.R. 10/31/20 0824 10/31/20 1312        Assessment/Plan POD#24 s/p lap assisted right hemicolectomy for ascending colon mass (adenocarcinoma) and multiple polyps (likely pre-malignant), Dr. Johney Maine 10/31/20 -CT scan reveals contrast in the colon by the time of the scan suggesting her gut is working, but her bowels remain distended.  No nausea with soft diet -CT with some fat necrosis in the pelvis, but no other complications or abscesses noted. - Wean TNA to off today as she is eating much better thus far. - C. Diff negative 5/23, no leukocytosis  -cont mobilization/IS/pulm toilet -add fiber supplement for diarrhea. No lomotil or anti-motility agents given ileus picture. -suspect if she continues to do well, she will likely be stable for DC to SNF early next week.    Acute blood loss anemia in setting of chronic GI losses from cancer.  Hgb stable   FEN - heart healthy, TPN to wean off today VTE - Eliquis ID - Cefotetan pre-op, PO vancomycin 5/24>5/24, zosyn 5/26>5/31, eraxis 5/26>5/31, unasyn 5/31> 6/1 (for PNA)   - per [facility] -  Atrial fibrillation - stable back on home meds toprol, cardizem, eliquis HTN HLD Diarrhea - c diff negative 5/23, no leukocytosis- adding fiber supplement today Severe malnutrition - prealbumin 5.7>>6.1>>15.1 today, continue TPN for now but hopefully stop soon if CT negative and diarrhea improving  Possible aspiration PNA - completed unasyn  LOS: 28 days    Robin [name], [facility] 11/24/2020, 9:39 AM Please see Amion for pager number during day hours 7:00am-4:30pm

## 2020-11-24 NOTE — TOC Progression Note (Addendum)
Transition of Care [facility]) - Progression Note   Patient Details  Name: Robin [name] MRN: [id number] Date of Birth: 1935-05-28  Transition of Care [facility]) CM/SW [place], LCSW Phone Number: 11/24/2020, 1:37 PM  Clinical Narrative: Initial SNF request for insurance approval was denied. Hospitalist provided peer-to-peer information. TOC to follow.  Addendum: Peer-to-peer has been denied. CSW left VM for daughter regarding denial and that family can call to appeal the denial. Patient's insurance is currently recommending HHPT rather than SNF.  Expected Discharge Plan: Skilled Nursing Facility Barriers to Discharge: Continued Medical Work up  Expected Discharge Plan and Services Expected Discharge Plan: [place] Discharge Planning Services: CM Consult Living arrangements for the past 2 months: Single Family Home  Readmission Risk Interventions Readmission Risk Prevention Plan 11/17/2020  Transportation Screening Complete  HRI or Home Care Consult Complete  Social Work Consult for [place] Planning/Counseling Complete  Palliative Care Screening Not Applicable  Medication Review Press photographer) Complete  Some recent data might be hidden

## 2020-11-24 NOTE — Progress Notes (Signed)
Calorie Count Note  48 hour calorie count ordered.  Diet: Heart Healthy Supplements:  -Boost Breeze po TID, each supplement provides 250 kcal and 9 grams of protein -Prosource Plus PO BID, each provides 100 kcals and 15g protein  6/8: Breakfast: 200 kcals, 6g protein Lunch: 0 Dinner: 85 kcals, 5g protein (1/2 cup of [employer] chicken noodle soup) Supplements: 350 kcal, 13g protein (1 Ensure Plus)  Total intake: 635 kcal (37% of minimum estimated needs)  24g protein (30% of minimum estimated needs)  **Pt reporting better appetite and foods and supplements are tasting better. Pt prefers Ensure and reports tolerating. Will switch to these.  Nutrition Dx: Increased nutrient needs related to acute illness as evidenced by estimated needs.  Goal: Pt to meet >/= 90% of their estimated nutrition needs    Intervention:  -Calorie Count -D/c Boost Breeze and Prosource -Ensure Plus po BID, each supplement provides 350 kcal and 13 grams of protein -Ensure MAX Protein po daily, each supplement provides 150 kcal and 30 grams of protein   Clayton Bibles, MS, RD, LDN Inpatient Clinical Dietitian Contact information available via Amion

## 2020-11-24 NOTE — Progress Notes (Signed)
[place] visited pt. as follow-up from visit earlier this wk.  Pt. sitting in chair eating ice cream when [place] arrived; expresses gratitude for sense of God's presence and peace just after last visit and for subsequent positive results from CT scan; she is encouraged by her regained ability to eat some food and seems to be making progress.  She hopes to be discharged to [facility] rehab this coming Monday.  CH met dtr. [name] who expressed gratitude for chaplains' support for her and pt. while pt. has been admitted this past month.  Pt. and dtr. requested prayer at end of visit.  CH remains available as needed.  Lindaann Pascal PRN Chaplain Pager: [phone]

## 2020-11-24 NOTE — Progress Notes (Signed)
[facility] Progress Note  Patient: Robin [name]    EPP:[id number]  DOA: 10/26/2020     Date of Service: the patient was seen and examined on 11/24/2020  Brief hospital course: 86 year old female with history of paroxysmal atrial fibrillation on Eliquis, hypertension, obesity presented to the ER ED with an deficiency anemia and lower GI bleed . 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 -Has had a prolonged postop course with recurrent postop ileus  -Hospital course was also complicated by aspiration pneumonia, pulmonary edema etc.  -On 5/26 and increasing abdominal distention and vomiting CT was notable for possible pneumatosis started on IV antibiotics and TPN then  -Ileus slowly improving, off antibiotics now  Assessment and Plan:  Adenocarcinoma of ascending colon  Iron deficiency anemia  -Underwent colonoscopy 5/13, biopsy positive for invasive adenocarcinoma. -Underwent laparoscopic proximal hemicolectomy with reanastomosis by Dr. Johney Maine on 5/16, pathology had negative margins -Hemoglobin is stable, will need oncology  Postop ileus, ?  Pneumatosis Diarrhea -Developed prolonged postop ileus requiring NG tube insertion as well as TPN. -Diet being slowly advanced, briefly treated with IV Zosyn as well, now discontinued -Taper off TPN -Avoid antidiarrheals, increase activity as tolerated, fiber added, some of her diarrhea could be from resolving ileus and malabsorption -Hopefully discharge to SNF on Monday  Aspiration pneumonia -Completed antibiotic course for this  Paroxysmal A. fib with RVR Rate currently controlled. -Continue Toprol, Cardizem and Eliquis  Essential hypertension -Stable, continue current regimen.  Acute on chronic diastolic CHF Volume overload from TPN.  Intermittently requiring Lasix. Echocardiogram shows preserved EF without  any wall motion abnormality. -IV Lasix x1 today  Hypokalemia, hypophosphatemia, hypomagnesemia. -Replaced  Body mass index is 23.92 kg/m.  Nutrition Problem: Increased nutrient needs Etiology: acute illness Interventions: Interventions: TPN  Stage II right buttock pressure ulcer. POA status not clear. Continue home dressing. Pressure Injury 11/14/20 Buttocks Right Stage 2 -  Partial thickness loss of dermis presenting as a shallow open injury with a red, pink wound bed without slough. (Active)  11/14/20 2230  Location: Buttocks  Location Orientation: Right  Staging: Stage 2 -  Partial thickness loss of dermis presenting as a shallow open injury with a red, pink wound bed without slough.  Wound Description (Comments):   Present on Admission:     DVT Prophylaxis:   Place TED hose Start: 11/19/20 1210 SCDs Start: 10/26/20 2337 apixaban (ELIQUIS) tablet 5 mg    Advance goals of care discussion: Full code  Family Communication: No family at bedside  Disposition:  Status is: Inpatient  Remains inpatient appropriate because:IV treatments appropriate due to intensity of illness or inability to take PO  Dispo: The patient is from: Home              Anticipated d/c is to: SNF              Patient currently is not medically stable to d/c.   Difficult to place patient No   Physical Exam: General: Chronically ill elderly female, sitting up in bed awake alert oriented x3 CVS: S1-S2, regular rate rhythm Lungs: Decreased breath sounds at both bases Abdomen: Soft, mildly distended, nontender, bowel sounds increased Extremities: 1+ edema, compression stockings on Skin: No rash on exposed skin  Vitals:   11/23/20 1315 11/23/20 2022 11/24/20 0500 11/24/20 0544  BP: 127/62 (!) 117/58  112/70  Pulse: 78 66  89  Resp: 18 16  18  Temp: (!) 97.5 F (36.4 C) 97.9 F (36.6 C)  98.1 F (36.7 C)  TempSrc: Oral Oral  Oral  SpO2: 100% 98%  99%  Weight:   63.2 kg   Height:         Intake/Output Summary (Last 24 hours) at 11/24/2020 1147 Last data filed at 11/24/2020 0600 Gross per 24 hour  Intake 1389.54 ml  Output 0 ml  Net 1389.54 ml   Filed Weights   11/22/20 0500 11/23/20 0510 11/24/20 0500  Weight: 62.9 kg 62.2 kg 63.2 kg   CBC: Recent Labs  Lab 11/18/20 1151 11/19/20 0315 11/21/20 0314 11/23/20 0459 11/24/20 0320  WBC 10.9* 10.2 8.7 8.8 8.0  NEUTROABS  --   --  6.7  --   --   HGB 10.1* 9.4* 9.5* 9.3* 9.4*  HCT 34.1* 32.2* 32.5* 31.2* 30.8*  MCV 82.2 82.1 82.9 82.8 81.5  PLT 447* 414* 387 364 758   Basic Metabolic Panel: Recent Labs  Lab 11/19/20 0315 11/20/20 0304 11/21/20 0314 11/23/20 0459 11/24/20 0320  NA 138 138 138 139 136  K 3.8 4.1 3.9 3.8 3.7  CL 105 105 106 109 105  CO2 26 27 27 25 25   GLUCOSE 132* 128* 137* 132* 126*  BUN 18 21 23  27* 21  CREATININE 0.48 0.53 0.47 0.50 0.56  CALCIUM 9.4 9.5 9.2 8.8* 9.2  MG 1.9 2.3 2.2 2.3 2.2  PHOS 2.8  --  2.6 2.0* 2.9    Studies: No results found.  Scheduled Meds:  (feeding supplement) PROSource Plus  30 mL Oral BID BM   acidophilus  2 capsule Oral BID   apixaban  5 mg Oral BID   Chlorhexidine Gluconate Cloth  6 each Topical Daily   cholestyramine light  4 g Oral Q12H   diltiazem  180 mg Oral Daily   famotidine  20 mg Oral Daily   feeding supplement  1 Container Oral TID BM   ferrous sulfate  325 mg Oral BID WC   lactobacillus acidophilus & bulgar  1 tablet Oral TID WC   lip balm  1 application Topical BID   liver oil-zinc oxide   Topical BID   metoprolol succinate  100 mg Oral Daily   phosphorus  500 mg Oral Q4H   polycarbophil  625 mg Oral Daily   protein supplement  1 Scoop Oral TID WC   sodium chloride flush  10-40 mL Intracatheter Q12H   Continuous Infusions:  methocarbamol (ROBAXIN) IV     TPN ADULT (ION) 40 mL/hr at 11/24/20 0600   PRN Meds: acetaminophen, acetaminophen, alum & mag hydroxide-simeth, artificial tears, bisacodyl, LORazepam, magic mouthwash,  menthol-cetylpyridinium, methocarbamol (ROBAXIN) IV, metoprolol tartrate, ondansetron **OR** ondansetron (ZOFRAN) IV, phenol, prochlorperazine **OR** prochlorperazine, simethicone, sodium chloride flush, traMADol  Time spent: 25 minutes  Domenic Polite, MD [facility] 11/24/2020 11:47 AM

## 2020-11-25 LAB — BASIC METABOLIC PANEL
Anion gap: 7 (ref 5–15)
BUN: 25 mg/dL — ABNORMAL HIGH (ref 8–23)
CO2: 27 mmol/L (ref 22–32)
Calcium: 9.3 mg/dL (ref 8.9–10.3)
Chloride: 105 mmol/L (ref 98–111)
Creatinine, Ser: 0.6 mg/dL (ref 0.44–1.00)
GFR, Estimated: >60 mL/min (ref >60)
Glucose, Bld: 92 mg/dL (ref 70–99)
Potassium: 3.7 mmol/L (ref 3.5–5.1)
Sodium: 139 mmol/L (ref 135–145)

## 2020-11-25 LAB — CBC
HCT: 31.4 % — ABNORMAL LOW (ref 36.0–46.0)
Hemoglobin: 9.4 g/dL — ABNORMAL LOW (ref 12.0–15.0)
MCH: 24.8 pg — ABNORMAL LOW (ref 26.0–34.0)
MCHC: 29.9 g/dL — ABNORMAL LOW (ref 30.0–36.0)
MCV: 82.8 fL (ref 80.0–100.0)
Platelets: 297 10*3/uL (ref 150–400)
RBC: 3.79 MIL/uL — ABNORMAL LOW (ref 3.87–5.11)
RDW: 27.6 % — ABNORMAL HIGH (ref 11.5–15.5)
WBC: 7.3 10*3/uL (ref 4.0–10.5)
nRBC: 0 % (ref 0.0–0.2)

## 2020-11-25 LAB — GLUCOSE, CAPILLARY: Glucose-Capillary: 94 mg/dL (ref 70–99)

## 2020-11-25 MED ORDER — CALCIUM POLYCARBOPHIL 625 MG PO TABS
625.0000 mg | ORAL_TABLET | Freq: Two times a day (BID) | ORAL | Status: DC
  Administered 2020-11-25 – 2020-11-29 (×8): 625 mg via ORAL
  Filled 2020-11-25 (×8): qty 1

## 2020-11-25 NOTE — Progress Notes (Signed)
Occupational Therapy Treatment Patient Details Name: Robin [name] MRN: [id number] DOB: [birth year] Today's Date: 11/25/2020    History of present illness 86 year old F presented to ED for anemia and shortness of breath, and admitted for blood loss anemia due to GI bleed on 10/26/20.  Hgb 8.5 (reportedly 11 in January 2022 per daughter).  Hemoccult positive.  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 s/p laparoscopic hemicolectomy 10/31/20. Pt  with PMH of A. fib on Eliquis and HTN. Hospitilization complicated by ileus.   OT comments  Patient is on her 29th day of hospitalization. Today she reports fatigue due to frequent bowel movements through the night and the necessary clean up. Patient reports fear on returning home stating her daughter would only be present a couple of hours during the day and otherwise she would be alone and she still feels significantly weak. Today treatment focused on activity tolerance. Attempted to perform the 5 x sit to stand test - patient only able to stand (without UE support) twice in one minute and exhibited fatigue and shortness of breath after activity. Patient exhibited poor hip strength with manual muscle testing. Patient stood statically for 17 seconds before needing sitting rest break. Patient overall has been improving with ADLs and functional mobility but her progress has been inconsistent throughout hospitilization. Patient continues to exhibit generalized weakness and poor activity tolerance needed for home tasks and IADLs. Continue to recommend short term rehab at discharge as she is at risk for falls and further injury.      Follow Up Recommendations  SNF    Equipment Recommendations  None recommended by OT    Recommendations for Other Services      Precautions / Restrictions Precautions Precautions: Fall Precaution Comments: abdominal  surgery Restrictions Weight Bearing Restrictions: No       Mobility Bed Mobility                    Transfers   Equipment used: Rolling walker (2 wheeled)             General transfer comment: Min guard/supervision with RW in room.    Balance Overall balance assessment: Mild deficits observed, not formally tested                                         ADL either performed or assessed with clinical judgement   ADL                                               Vision Patient Visual Report: No change from baseline     Perception     Praxis      Cognition Arousal/Alertness: Awake/alert Behavior During Therapy: WFL for tasks assessed/performed Overall Cognitive Status: Within Functional Limits for tasks assessed                                          Exercises Other Exercises Other Exercises: 2 sit to stands without UE assistance Other Exercises: Stood 17.3 seconds before needing to rest   Shoulder Instructions       General Comments  Pertinent Vitals/ Pain       Pain Assessment: No/denies pain  Home Living                                          Prior Functioning/Environment              Frequency  Min 2X/week        Progress Toward Goals  OT Goals(current goals can now be found in the care plan section)  Progress towards OT goals: Progressing toward goals  Acute Rehab OT Goals Patient Stated Goal: to get stronger OT Goal Formulation: With patient Time For Goal Achievement: 11/28/20 Potential to Achieve Goals: Good  Plan Discharge plan needs to be updated    Co-evaluation                 AM-PAC OT "6 Clicks" Daily Activity     Outcome Measure   Help from another person eating meals?: A Little Help from another person taking care of personal grooming?: A Little Help from another person toileting, which includes using toliet, bedpan,  or urinal?: A Little Help from another person bathing (including washing, rinsing, drying)?: A Little Help from another person to put on and taking off regular upper body clothing?: A Little Help from another person to put on and taking off regular lower body clothing?: A Little 6 Click Score: 18    End of Session Equipment Utilized During Treatment: Rolling walker  OT Visit Diagnosis: Other abnormalities of gait and mobility (R26.89)   Activity Tolerance Patient limited by fatigue   Patient Left in chair;with call bell/phone within reach   Nurse Communication Mobility status        Time: 3664-4034 OT Time Calculation (min): 11 min  Charges: OT General Charges $OT Visit: 1 Visit OT Treatments $Therapeutic Activity: 8-22 mins  Wylene Weissman, OTR/L [place]  Office [phone] Pager: [phone]    Lenward Chancellor 11/25/2020, 12:41 PM

## 2020-11-25 NOTE — Progress Notes (Signed)
Nutrition Follow-up  INTERVENTION:   -Ensure Plus po BID, each supplement provides 350 kcal and 13 grams of protein  -Ensure MAX Protein po daily, each supplement provides 150 kcal and 30 grams of protein   NUTRITION DIAGNOSIS:   Increased nutrient needs related to acute illness as evidenced by estimated needs.  Ongoing.  GOAL:   Patient will meet greater than or equal to 90% of their needs  Progressing.  MONITOR:   Diet advancement, Labs, Weight trends, I & O's, Other (Comment) (TPN regimen)   ASSESSMENT:   86 y.o. female with medical history of A.Fib on Eliquis and HTN. She presented to the ED for evaluation of anemia and SOB which has been ongoing since 06/2020.  5/16: s/p lap assisted right hemicolectomy for ascending colon mass  5/27: TPN initiated 6/9: TPN weaned off  Patient very motivated to eat better and feels better today. Ate a good breakfast of 75% of blueberry muffin, eggs and toast. Wants a [place] sandwich for lunch. Is trying to drink Ensure supplements. States she won't eat anymore ice cream as it caused some discomfort and gas for her.   6/9 Calorie Count: B: 200 kcals, 6g protein L: -supplement D: 130 kcals, 7g protein Supplements: 440 kcals, 39g protein (1 Ensure Max, 1 Magic cup) Total: 770 kcals (45% of needs), 52g protein (65% of needs)  Pt can meet needs if she eats lunch and drinks more supplements.   Admission weight: 145 lbs. Current weight: 132 lbs.  Medications: Ferrous sulfate, Lactinex, Fibercon  Labs reviewed: CBGs: 94-130  Diet Order:   Diet Order             Diet Heart Room service appropriate? Yes; Fluid consistency: Thin  Diet effective now                   EDUCATION NEEDS:   No education needs have been identified at this time  Skin:  Skin Assessment: Skin Integrity Issues: Skin Integrity Issues:: Stage II Stage II: right buttocks  Last BM:  6/6 -type 7  Height:   Ht Readings from Last 1 Encounters:   11/05/20 5\' 4"  (1.626 m)    Weight:   Wt Readings from Last 1 Encounters:  11/25/20 60 kg    BMI:  Body mass index is 22.71 kg/m.  Estimated Nutritional Needs:   Kcal:  1700-1900 kcal  Protein:  80-95 grams  Fluid:  >/= 2 L/day   Clayton Bibles, MS, RD, LDN Inpatient Clinical Dietitian Contact information available via Amion

## 2020-11-25 NOTE — TOC Progression Note (Signed)
Transition of Care [facility]) - Progression Note   Patient Details  Name: Robin [name] MRN: [id number] Date of Birth: [birth year]  Transition of [facility]) CM/SW [place], LCSW Phone Number: 11/25/2020, 10:18 AM  Clinical Narrative: CSW provided daughter with appeals information for patient's SNF denial: [phone]. Daughter to assist the patient with filing the appeal today. TOC to follow.  Expected Discharge Plan: Skilled Nursing Facility Barriers to Discharge: Continued Medical Work up  Expected Discharge Plan and Services Expected Discharge Plan: [place] Discharge Planning Services: CM Consult Living arrangements for the past 2 months: Single Family Home  Readmission Risk Interventions Readmission Risk Prevention Plan 11/17/2020  Transportation Screening Complete  HRI or Home Care Consult Complete  Social Work Consult for [place] Planning/Counseling Complete  Palliative Care Screening Not Applicable  Medication Review [employer] photographer) Complete  Some recent data might be hidden

## 2020-11-25 NOTE — Progress Notes (Signed)
[facility] Progress Note  Patient: Robin [name]    GOT:[id number]  DOA: 10/26/2020     Date of Service: the patient was seen and examined on 11/25/2020  Brief hospital course: 86 year old female with history of paroxysmal atrial fibrillation on Eliquis, hypertension, obesity presented to the ER ED with an deficiency anemia and lower GI bleed . 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 -Has had a prolonged postop course with recurrent postop ileus  -Hospital course was also complicated by aspiration pneumonia, pulmonary edema etc.  -On 5/26 and increasing abdominal distention and vomiting CT was notable for possible pneumatosis started on IV antibiotics and TPN then  -Ileus slowly improving, off antibiotics now -Unfortunately having 8-10 BMs per day  Assessment and Plan:  Adenocarcinoma of ascending colon  Iron deficiency anemia  -Underwent colonoscopy 5/13, biopsy positive for invasive adenocarcinoma. -Underwent laparoscopic proximal hemicolectomy with reanastomosis by Dr. Johney Maine on 5/16, pathology had negative margins -Hemoglobin is stable, will need oncology FU  Prolonged postop ileus Diarrhea -Developed prolonged postop ileus requiring NG tube insertion as well as TPN. -Diet being slowly advanced, briefly treated with IV Zosyn as well, now discontinued -Tapered off TPN -Avoid antidiarrheals, increase activity as tolerated, fiber added, some of her diarrhea could be from resolving ileus and malabsorption, had 8-10 BMs since yesterday evening -Hopefully discharge to SNF vs Home w/ [place] on Monday  Aspiration pneumonia -Completed antibiotic course for this  Paroxysmal A. fib with RVR -Rate currently controlled. -Continue Toprol, Cardizem and Eliquis  Essential hypertension -Stable, continue current regimen.  Acute on chronic diastolic CHF Volume  overload from TPN.  Intermittently requiring Lasix. Echocardiogram shows preserved EF without any wall motion abnormality. -Volume status is improved, resume Po lasix tomorrow  Hypokalemia, hypophosphatemia, hypomagnesemia. -Replaced  Body mass index is 22.71 kg/m.  Nutrition Problem: Increased nutrient needs Etiology: acute illness Interventions: Interventions: TPN  Stage II right buttock pressure ulcer. POA status not clear. Continue home dressing. Pressure Injury 11/14/20 Buttocks Right Stage 2 -  Partial thickness loss of dermis presenting as a shallow open injury with a red, pink wound bed without slough. (Active)  11/14/20 2230  Location: Buttocks  Location Orientation: Right  Staging: Stage 2 -  Partial thickness loss of dermis presenting as a shallow open injury with a red, pink wound bed without slough.  Wound Description (Comments):   Present on Admission:     DVT Prophylaxis:   apixaban (ELIQUIS) tablet 5 mg    Advance goals of care discussion: Full code  Family Communication: No family at bedside  Disposition:  Status is: Inpatient  Remains inpatient appropriate because:IV treatments appropriate due to intensity of illness or inability to take PO  Dispo: The patient is from: Home              Anticipated d/c is to: SNF              Patient currently is not medically stable to d/c.   Difficult to place patient No   Physical Exam: General: Chronically ill elderly female sitting up in bed, AAOx3 CVS: S1-S2, regular rate rhythm Lungs: Decreased breath sounds to bases Abdomen: Soft, less distended, not nontender, bowel sounds present Extremities: Trace edema, compression stockings on  Skin: No rash on exposed skin  Vitals:   11/24/20 1433 11/24/20 2104 11/25/20 0424 11/25/20 0500  BP: 120/73 122/62 123/65   Pulse: 92 89  89   Resp: 20 19 20    Temp: 97.8 F (36.6 C) (!) 97.3 F (36.3 C) (!) 97.5 F (36.4 C)   TempSrc: Oral Oral Oral   SpO2: 98% 99%  98%   Weight:    60 kg  Height:        Intake/Output Summary (Last 24 hours) at 11/25/2020 1135 Last data filed at 11/25/2020 1015 Gross per 24 hour  Intake 530.09 ml  Output --  Net 530.09 ml   Filed Weights   11/23/20 0510 11/24/20 0500 11/25/20 0500  Weight: 62.2 kg 63.2 kg 60 kg   CBC: Recent Labs  Lab 11/19/20 0315 11/21/20 0314 11/23/20 0459 11/24/20 0320 11/25/20 0421  WBC 10.2 8.7 8.8 8.0 7.3  NEUTROABS  --  6.7  --   --   --   HGB 9.4* 9.5* 9.3* 9.4* 9.4*  HCT 32.2* 32.5* 31.2* 30.8* 31.4*  MCV 82.1 82.9 82.8 81.5 82.8  PLT 414* 387 364 351 945   Basic Metabolic Panel: Recent Labs  Lab 11/19/20 0315 11/20/20 0304 11/21/20 0314 11/23/20 0459 11/24/20 0320 11/25/20 0421  NA 138 138 138 139 136 139  K 3.8 4.1 3.9 3.8 3.7 3.7  CL 105 105 106 109 105 105  CO2 26 27 27 25 25 27   GLUCOSE 132* 128* 137* 132* 126* 92  BUN 18 21 23  27* 21 25*  CREATININE 0.48 0.53 0.47 0.50 0.56 0.60  CALCIUM 9.4 9.5 9.2 8.8* 9.2 9.3  MG 1.9 2.3 2.2 2.3 2.2  --   PHOS 2.8  --  2.6 2.0* 2.9  --     Studies: No results found.  Scheduled Meds:  acidophilus  2 capsule Oral BID   apixaban  5 mg Oral BID   Chlorhexidine Gluconate Cloth  6 each Topical Daily   cholestyramine light  4 g Oral Q12H   diltiazem  180 mg Oral Daily   famotidine  20 mg Oral Daily   feeding supplement  237 mL Oral BID BM   ferrous sulfate  325 mg Oral BID WC   lactobacillus acidophilus & bulgar  1 tablet Oral TID WC   lip balm  1 application Topical BID   liver oil-zinc oxide   Topical BID   metoprolol succinate  100 mg Oral Daily   polycarbophil  625 mg Oral Daily   Ensure Max Protein  11 oz Oral Daily   sodium chloride flush  10-40 mL Intracatheter Q12H   Continuous Infusions:  methocarbamol (ROBAXIN) IV     PRN Meds: acetaminophen, acetaminophen, alum & mag hydroxide-simeth, artificial tears, bisacodyl, LORazepam, magic mouthwash, menthol-cetylpyridinium, methocarbamol (ROBAXIN) IV,  metoprolol tartrate, ondansetron **OR** ondansetron (ZOFRAN) IV, phenol, prochlorperazine **OR** prochlorperazine, simethicone, sodium chloride flush, traMADol  Time spent: 25 minutes  Domenic Polite, MD [facility] 11/25/2020 11:35 AM

## 2020-11-25 NOTE — Progress Notes (Signed)
Progress Note  25 Days Post-Op  Subjective: Tolerating soft diet well with minimal nausea.  Still having diarrhea and slightly increased overnight and this morning.  Objective: Vital signs in last 24 hours: Temp:  [97.3 F (36.3 C)-97.8 F (36.6 C)] 97.5 F (36.4 C) (06/10 0424) Pulse Rate:  [89-92] 89 (06/10 0424) Resp:  [19-20] 20 (06/10 0424) BP: (120-123)/(62-73) 123/65 (06/10 0424) SpO2:  [98 %-99 %] 98 % (06/10 0424) Weight:  [60 kg] 60 kg (06/10 0500) Last BM Date: 11/24/20  Intake/Output from previous day: 06/09 0701 - 06/10 0700 In: 290.1 [I.V.:290.1] Out: -  Intake/Output this shift: Total I/O In: 240 [P.O.:240] Out: -   PE: Abd: soft, NT, some distention, but improving +BS, incisions c/d/i   Lab Results:  Recent Labs    11/24/20 0320 11/25/20 0421  WBC 8.0 7.3  HGB 9.4* 9.4*  HCT 30.8* 31.4*  PLT 351 297   BMET Recent Labs    11/24/20 0320 11/25/20 0421  NA 136 139  K 3.7 3.7  CL 105 105  CO2 25 27  GLUCOSE 126* 92  BUN 21 25*  CREATININE 0.56 0.60  CALCIUM 9.2 9.3   PT/INR No results for input(s): LABPROT, INR in the last 72 hours. CMP     Component Value Date/Time   NA 139 11/25/2020 0421   NA 139 02/21/2018 0854   K 3.7 11/25/2020 0421   CL 105 11/25/2020 0421   CO2 27 11/25/2020 0421   GLUCOSE 92 11/25/2020 0421   BUN 25 (H) 11/25/2020 0421   BUN 20 02/21/2018 0854   CREATININE 0.60 11/25/2020 0421   CREATININE 0.54 (L) 11/28/2015 1422   CALCIUM 9.3 11/25/2020 0421   PROT 5.6 (L) 11/24/2020 0320   ALBUMIN 2.6 (L) 11/24/2020 0320   AST 20 11/24/2020 0320   ALT 21 11/24/2020 0320   ALKPHOS 175 (H) 11/24/2020 0320   BILITOT 0.9 11/24/2020 0320   GFRNONAA >60 11/25/2020 0421   GFRAA 78 02/21/2018 0854   Lipase     Component Value Date/Time   LIPASE 31 10/26/2020 1919       Studies/Results: No results found.   Anti-infectives: Anti-infectives (From admission, onward)    Start     Dose/Rate Route Frequency  Ordered Stop   11/16/20 1200  Ampicillin-Sulbactam (UNASYN) 3 g in sodium chloride 0.9 % 100 mL IVPB        3 g 200 mL/hr over 30 Minutes Intravenous Every 6 hours 11/16/20 0755 11/17/20 0840   11/15/20 1200  Ampicillin-Sulbactam (UNASYN) 3 g in sodium chloride 0.9 % 100 mL IVPB  Status:  Discontinued        3 g 200 mL/hr over 30 Minutes Intravenous Every 6 hours 11/15/20 1055 11/16/20 0755   11/11/20 1800  anidulafungin (ERAXIS) 100 mg in sodium chloride 0.9 % 100 mL IVPB  Status:  Discontinued       See Hyperspace for full Linked Orders Report.   100 mg 78 mL/hr over 100 Minutes Intravenous Every 24 hours 11/10/20 1622 11/15/20 0815   11/10/20 2200  piperacillin-tazobactam (ZOSYN) IVPB 3.375 g  Status:  Discontinued        3.375 g 12.5 mL/hr over 240 Minutes Intravenous Every 8 hours 11/10/20 1610 11/15/20 1035   11/10/20 1800  anidulafungin (ERAXIS) 200 mg in sodium chloride 0.9 % 200 mL IVPB       See Hyperspace for full Linked Orders Report.   200 mg 78 mL/hr over 200 Minutes Intravenous  Once 11/10/20 1622 11/11/20 0016   11/10/20 1700  anidulafungin (ERAXIS) 100 mg in sodium chloride 0.9 % 100 mL IVPB  Status:  Discontinued        100 mg 78 mL/hr over 100 Minutes Intravenous Every 24 hours 11/10/20 1610 11/10/20 1621   11/10/20 1700  piperacillin-tazobactam (ZOSYN) IVPB 3.375 g        3.375 g 100 mL/hr over 30 Minutes Intravenous  Once 11/10/20 1618 11/10/20 1720   11/08/20 0045  vancomycin (VANCOCIN) 50 mg/mL oral solution 125 mg  Status:  Discontinued        125 mg Oral Every 6 hours 11/07/20 2354 11/08/20 1304   10/31/20 2200  cefoTEtan (CEFOTAN) 2 g in sodium chloride 0.9 % 100 mL IVPB        2 g 200 mL/hr over 30 Minutes Intravenous Every 12 hours 10/31/20 1744 10/31/20 2205   10/31/20 0915  cefoTEtan (CEFOTAN) 2 g in sodium chloride 0.9 % 100 mL IVPB        2 g 200 mL/hr over 30 Minutes Intravenous On call to O.R. 10/31/20 0824 10/31/20 1312         Assessment/Plan POD#25 s/p lap assisted right hemicolectomy for ascending colon mass (adenocarcinoma) and multiple polyps (likely pre-malignant), Dr. Johney Maine 10/31/20 -CT scan reveals contrast in the colon by the time of the scan suggesting her gut is working, but her bowels remain distended.  No nausea with soft diet -CT with some fat necrosis in the pelvis, but no other complications or abscesses noted. - TNA DC on 6/9 - C. Diff negative 5/23, no leukocytosis  -cont mobilization/IS/pulm toilet -increase fiber supplement to BID for diarrhea. No lomotil or anti-motility agents given ileus picture.  As this improves, may be able to try low dose if still having diarrhea -suspect if she continues to do well, she will likely be stable for DC next week    Acute blood loss anemia in setting of chronic GI losses from cancer.  Hgb stable   FEN - heart healthy, fiber supplement BID VTE - Eliquis ID - Cefotetan pre-op, PO vancomycin 5/24>5/24, zosyn 5/26>5/31, eraxis 5/26>5/31, unasyn 5/31> 6/1 (for PNA)   - per [facility] -  Atrial fibrillation - stable back on home meds toprol, cardizem, eliquis HTN HLD Diarrhea - c diff negative 5/23, no leukocytosis- adding fiber supplement today Severe malnutrition - prealbumin 5.7>>6.1>>15.1 today, continue TPN for now but hopefully stop soon if CT negative and diarrhea improving  Possible aspiration PNA - completed unasyn  LOS: 29 days    Henreitta Cea, [facility] 11/25/2020, 12:15 PM Please see Amion for pager number during day hours 7:00am-4:30pm

## 2020-11-25 NOTE — Plan of Care (Signed)
  Problem: Activity: Goal: Risk for activity intolerance will decrease Outcome: Progressing   Problem: Pain Managment: Goal: General experience of comfort will improve Outcome: Progressing   Problem: Safety: Goal: Ability to remain free from injury will improve Outcome: Progressing   

## 2020-11-26 LAB — BASIC METABOLIC PANEL
Anion gap: 8 (ref 5–15)
BUN: 22 mg/dL (ref 8–23)
CO2: 27 mmol/L (ref 22–32)
Calcium: 9.6 mg/dL (ref 8.9–10.3)
Chloride: 104 mmol/L (ref 98–111)
Creatinine, Ser: 0.48 mg/dL (ref 0.44–1.00)
GFR, Estimated: >60 mL/min (ref >60)
Glucose, Bld: 85 mg/dL (ref 70–99)
Potassium: 3.5 mmol/L (ref 3.5–5.1)
Sodium: 139 mmol/L (ref 135–145)

## 2020-11-26 LAB — GLUCOSE, CAPILLARY: Glucose-Capillary: 83 mg/dL (ref 70–99)

## 2020-11-26 MED ORDER — HYDROCHLOROTHIAZIDE 25 MG PO TABS
25.0000 mg | ORAL_TABLET | Freq: Every day | ORAL | Status: DC
  Administered 2020-11-26 – 2020-11-29 (×4): 25 mg via ORAL
  Filled 2020-11-26 (×4): qty 1

## 2020-11-26 MED ORDER — POTASSIUM CHLORIDE CRYS ER 20 MEQ PO TBCR
40.0000 meq | EXTENDED_RELEASE_TABLET | Freq: Every day | ORAL | Status: DC
  Administered 2020-11-26: 40 meq via ORAL
  Filled 2020-11-26: qty 2

## 2020-11-26 NOTE — Progress Notes (Signed)
Pt slightly tachy w/HR 100-110's. Dr Broadus John aware on rounds. Medication for Afib given early. No distress noted.

## 2020-11-26 NOTE — Progress Notes (Signed)
Physical Therapy Treatment Patient Details Name: Robin [name] MRN: [id number] DOB: 1935/06/18 Today's Date: 11/26/2020    History of Present Illness 86 year old F presented to ED for anemia and shortness of breath, and admitted for blood loss anemia due to GI bleed on 10/26/20.  Hgb 8.5 (reportedly 11 in January 2022 per daughter).  Hemoccult positive.  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 s/p laparoscopic hemicolectomy 10/31/20. Pt  with PMH of A. fib on Eliquis and HTN. Hospitilization complicated by ileus.    PT Comments    Instructed pt/daughter in BLE strengthening exercises to be done independently BID. Pt had just walked in the hallway prior to PT session, she was too fatigued to attempt further ambulation.    Follow Up Recommendations  Home health PT     Equipment Recommendations  3in1 (PT)    Recommendations for Other Services       Precautions / Restrictions Precautions Precautions: Fall Precaution Comments: abdominal surgery Restrictions Weight Bearing Restrictions: No    Mobility  Bed Mobility               General bed mobility comments: pt in recliner upon arrival    Transfers Overall transfer level: Needs assistance Equipment used: None Transfers: Sit to/from Stand Sit to Stand: Supervision Stand pivot transfers: Min guard;Supervision       General transfer comment: good safety awareness  Ambulation/Gait Ambulation/Gait assistance: Supervision Gait Distance (Feet): 15 Feet Assistive device: IV Pole Gait Pattern/deviations: Step-through pattern;Decreased stride length Gait velocity: decreased   General Gait Details: pt had just walked in the hallway with her daughter, she was too fatigued to attempt another long walk but did walk to the bathroom without loss of balance   Stairs             Wheelchair Mobility    Modified Rankin (Stroke  Patients Only)       Balance Overall balance assessment: Mild deficits observed, not formally tested Sitting-balance support: No upper extremity supported Sitting balance-Leahy Scale: Good     Standing balance support: No upper extremity supported Standing balance-Leahy Scale: Fair Standing balance comment: Using IV pole to ambulate                            Cognition Arousal/Alertness: Awake/alert Behavior During Therapy: WFL for tasks assessed/performed Overall Cognitive Status: Within Functional Limits for tasks assessed                                        Exercises General Exercises - Lower Extremity Ankle Circles/Pumps: AROM;Both;Seated;15 reps Quad Sets: AROM;Both;5 reps;Supine Gluteal Sets: AROM;Both;5 reps;Supine Long Arc Quad: AROM;Both;10 reps;Seated Hip Flexion/Marching: AROM;Both;Seated;10 reps    General Comments        Pertinent Vitals/Pain Pain Assessment: No/denies pain Pain Score: 0-No pain Pain Intervention(s): Monitored during session;Repositioned    Home Living                      Prior Function            PT Goals (current goals can now be found in the care plan section) Acute Rehab PT Goals Patient Stated Goal: to get stronger PT Goal Formulation: With patient Time For Goal Achievement: 12/05/20 Potential to Achieve Goals: Good Progress towards PT goals: Progressing  toward goals    Frequency    Min 3X/week      PT Plan Discharge plan needs to be updated    Co-evaluation              AM-PAC PT "6 Clicks" Mobility   Outcome Measure  Help needed turning from your back to your side while in a flat bed without using bedrails?: A Little Help needed moving from lying on your back to sitting on the side of a flat bed without using bedrails?: A Little Help needed moving to and from a bed to a chair (including a wheelchair)?: A Little Help needed standing up from a chair using your arms  (e.g., wheelchair or bedside chair)?: None Help needed to walk in hospital room?: None Help needed climbing 3-5 steps with a railing? : A Little 6 Click Score: 20    End of Session Equipment Utilized During Treatment: Gait belt Activity Tolerance: Patient tolerated treatment well Patient left: with call bell/phone within reach;in chair;with family/visitor present (pt has been ambulating to bathroom on her own with IV pole) Nurse Communication: Mobility status PT Visit Diagnosis: Difficulty in walking, not elsewhere classified (R26.2)     Time: 9794-8016 PT Time Calculation (min) (ACUTE ONLY): 12 min  Charges:  $Therapeutic Exercise: 8-22 mins                     Blondell Reveal Kistler PT 11/26/2020  Acute Rehabilitation Services Pager [phone] Office [phone]

## 2020-11-26 NOTE — Progress Notes (Signed)
[facility] Progress Note  Patient: Robin [name]    NLZ:[id number]  DOA: 10/26/2020     Date of Service: the patient was seen and examined on 11/26/2020  Brief hospital course: 86 year old female with history of paroxysmal atrial fibrillation on Eliquis, hypertension, obesity presented to the ER ED with an deficiency anemia and lower GI bleed . 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 -Has had a prolonged postop course with recurrent postop ileus  -Hospital course was also complicated by aspiration pneumonia, pulmonary edema etc.  -On 5/26 and increasing abdominal distention and vomiting CT was notable for possible pneumatosis started on IV antibiotics and TPN then  -Ileus slowly improving, off antibiotics now -Unfortunately having 8-10 BMs per day  Assessment and Plan:  Adenocarcinoma of ascending colon  Iron deficiency anemia  -Underwent colonoscopy 5/13, biopsy positive for invasive adenocarcinoma. -Underwent laparoscopic proximal hemicolectomy with reanastomosis by Dr. Johney Maine on 5/16, pathology had negative margins -Hemoglobin is stable, will need oncology FU  Prolonged postop ileus Diarrhea -Developed prolonged postop ileus requiring NG tube insertion as well as TPN. -Diet being slowly advanced, briefly treated with IV Zosyn as well, now discontinued -Tapered off TPN -Avoid antidiarrheals, increase activity as tolerated, fiber added, some of her diarrhea could be from resolving ileus, hemicolectomy and malabsorption, still having frequent stools, appears to be improving on increased fiber -Hopefully discharge to SNF vs Home w/ [place] on Monday  Aspiration pneumonia -Completed antibiotic course   Paroxysmal A. fib with RVR -Rate currently controlled. -Continue Toprol, Cardizem and Eliquis  Essential hypertension -Stable, continue current  regimen.  Acute on chronic diastolic CHF Volume overload from TPN.  Intermittently requiring Lasix. Echocardiogram shows preserved EF without any wall motion abnormality. -Volume status has improved, resume home dose of HCTZ today  Hypokalemia, hypophosphatemia, hypomagnesemia. -Replaced  Body mass index is 22.48 kg/m.  Nutrition Problem: Increased nutrient needs Etiology: acute illness Interventions: Interventions: TPN  Stage II right buttock pressure ulcer. POA status not clear. Continue home dressing. Pressure Injury 11/14/20 Buttocks Right Stage 2 -  Partial thickness loss of dermis presenting as a shallow open injury with a red, pink wound bed without slough. (Active)  11/14/20 2230  Location: Buttocks  Location Orientation: Right  Staging: Stage 2 -  Partial thickness loss of dermis presenting as a shallow open injury with a red, pink wound bed without slough.  Wound Description (Comments):   Present on Admission:     DVT Prophylaxis:   apixaban (ELIQUIS) tablet 5 mg    Advance goals of care discussion: Full code  Family Communication: No family at bedside  Disposition:  Status is: Inpatient  Remains inpatient appropriate because:IV treatments appropriate due to intensity of illness or inability to take PO  Dispo: The patient is from: Home              Anticipated d/c is to: SNF              Patient currently is not medically stable to d/c.   Difficult to place patient No   Physical Exam: General: Chronically ill elderly female sitting up in bed, AAOx3, no distress CVS: S1-S2, regular rate rhythm Lungs: Decreased breath sounds to bases Abdomen: Soft, less distended, nontender, bowel sounds present Extremities: Trace edema, compression stockings on  Skin: No rash on exposed skin  Vitals:   11/25/20 2128 11/26/20 0500 11/26/20 0521 11/26/20 0813  BP:  117/67  134/76   Pulse: 80  83 (!) 109  Resp: 16  16   Temp: 98.2 F (36.8 C)  98.7 F (37.1 C)    TempSrc: Oral     SpO2: 99%  99%   Weight:  59.4 kg    Height:        Intake/Output Summary (Last 24 hours) at 11/26/2020 1135 Last data filed at 11/26/2020 0900 Gross per 24 hour  Intake 480 ml  Output 0 ml  Net 480 ml   Filed Weights   11/24/20 0500 11/25/20 0500 11/26/20 0500  Weight: 63.2 kg 60 kg 59.4 kg   CBC: Recent Labs  Lab 11/21/20 0314 11/23/20 0459 11/24/20 0320 11/25/20 0421  WBC 8.7 8.8 8.0 7.3  NEUTROABS 6.7  --   --   --   HGB 9.5* 9.3* 9.4* 9.4*  HCT 32.5* 31.2* 30.8* 31.4*  MCV 82.9 82.8 81.5 82.8  PLT 387 364 351 938   Basic Metabolic Panel: Recent Labs  Lab 11/20/20 0304 11/21/20 0314 11/23/20 0459 11/24/20 0320 11/25/20 0421 11/26/20 0350  NA 138 138 139 136 139 139  K 4.1 3.9 3.8 3.7 3.7 3.5  CL 105 106 109 105 105 104  CO2 27 27 25 25 27 27   GLUCOSE 128* 137* 132* 126* 92 85  BUN 21 23 27* 21 25* 22  CREATININE 0.53 0.47 0.50 0.56 0.60 0.48  CALCIUM 9.5 9.2 8.8* 9.2 9.3 9.6  MG 2.3 2.2 2.3 2.2  --   --   PHOS  --  2.6 2.0* 2.9  --   --     Studies: No results found.  Scheduled Meds:  acidophilus  2 capsule Oral BID   apixaban  5 mg Oral BID   Chlorhexidine Gluconate Cloth  6 each Topical Daily   cholestyramine light  4 g Oral Q12H   diltiazem  180 mg Oral Daily   famotidine  20 mg Oral Daily   feeding supplement  237 mL Oral BID BM   ferrous sulfate  325 mg Oral BID WC   hydrochlorothiazide  25 mg Oral Daily   lactobacillus acidophilus & bulgar  1 tablet Oral TID WC   lip balm  1 application Topical BID   liver oil-zinc oxide   Topical BID   metoprolol succinate  100 mg Oral Daily   polycarbophil  625 mg Oral BID   potassium chloride  40 mEq Oral Daily   Ensure Max Protein  11 oz Oral Daily   sodium chloride flush  10-40 mL Intracatheter Q12H   Continuous Infusions:  methocarbamol (ROBAXIN) IV     PRN Meds: acetaminophen, acetaminophen, alum & mag hydroxide-simeth, artificial tears, bisacodyl, LORazepam, magic  mouthwash, menthol-cetylpyridinium, methocarbamol (ROBAXIN) IV, metoprolol tartrate, ondansetron **OR** ondansetron (ZOFRAN) IV, phenol, prochlorperazine **OR** prochlorperazine, simethicone, sodium chloride flush, traMADol  Time spent: 25 minutes  Domenic Polite, MD [facility] 11/26/2020 11:35 AM

## 2020-11-26 NOTE — Progress Notes (Addendum)
Occupational Therapy Treatment Patient Details Name: Robin [name] MRN: [id number] DOB: [birth year] Today's Date: 11/26/2020    History of present illness 86 year old F presented to ED for anemia and shortness of breath, and admitted for blood loss anemia due to GI bleed on 10/26/20.  Hgb 8.5 (reportedly 11 in January 2022 per daughter).  Hemoccult positive.  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 s/p laparoscopic hemicolectomy 10/31/20. Pt  with PMH of A. fib on Eliquis and HTN. Hospitilization complicated by ileus.   OT comments  Pt making good progress with functional goals. Pt in recliner upon arrival. Session focused on functional mobility endurance, toilet transfers, toileting tasks, LB dressing, hygiene standing at sink. Pt reports that she is concerned about not being able to go to SNF for rehab, stating, "the insurance company is giving me a hard time and I need to go because I am so weak". OT will continue to follow acutely to maximize level of function and safety  Follow Up Recommendations  SNF    Equipment Recommendations  Other (comment) Management consultant)    Recommendations for Other Services      Precautions / Restrictions Precautions Precautions: Fall Precaution Comments: abdominal surgery Restrictions Weight Bearing Restrictions: No       Mobility Bed Mobility               General bed mobility comments: pt in recliner upon arrival    Transfers Overall transfer level: Needs assistance Equipment used: 1 person hand held assist;Rolling walker (2 wheeled) Transfers: Sit to/from Stand Sit to Stand: Supervision Stand pivot transfers: Min guard;Supervision       General transfer comment: Min guard/supervision with RW in room.    Balance Overall balance assessment: Mild deficits observed, not formally tested Sitting-balance support: No upper extremity supported Sitting  balance-Leahy Scale: Good     Standing balance support: No upper extremity supported Standing balance-Leahy Scale: Fair Standing balance comment: Using RW or IV pole to ambulate                           ADL either performed or assessed with clinical judgement   ADL Overall ADL's : Needs assistance/impaired             Lower Body Bathing: Min guard;Sit to/from stand       Lower Body Dressing: Minimal assistance;Sit to/from stand Lower Body Dressing Details (indicate cue type and reason): assist with depends at feet to get them over for off and on Toilet Transfer: Supervision/safety;Ambulation   Toileting- Clothing Manipulation and Hygiene: Min guard;Sit to/from stand       Functional mobility during ADLs: Supervision/safety       Vision Patient Visual Report: No change from baseline     Perception     Praxis      Cognition Arousal/Alertness: Awake/alert Behavior During Therapy: WFL for tasks assessed/performed Overall Cognitive Status: Within Functional Limits for tasks assessed                                          Exercises     Shoulder Instructions       General Comments      Pertinent Vitals/ Pain       Pain Assessment: No/denies pain Pain Score: 0-No pain Pain Intervention(s): Monitored during session;Repositioned  Home Living                                          Prior Functioning/Environment              Frequency  Min 2X/week        Progress Toward Goals  OT Goals(current goals can now be found in the care plan section)  Progress towards OT goals: Progressing toward goals  Acute Rehab OT Goals Patient Stated Goal: to get stronger  Plan Discharge plan needs to be updated;Frequency remains appropriate    Co-evaluation                 AM-PAC OT "6 Clicks" Daily Activity     Outcome Measure   Help from another person eating meals?: None Help from another person  taking care of personal grooming?: A Little Help from another person toileting, which includes using toliet, bedpan, or urinal?: A Little Help from another person bathing (including washing, rinsing, drying)?: A Little Help from another person to put on and taking off regular upper body clothing?: None Help from another person to put on and taking off regular lower body clothing?: A Little 6 Click Score: 20    End of Session Equipment Utilized During Treatment: Rolling walker  OT Visit Diagnosis: Other abnormalities of gait and mobility (R26.89)   Activity Tolerance Patient limited by fatigue   Patient Left in chair;with call bell/phone within reach   Nurse Communication          Time: 7017-7939 OT Time Calculation (min): 15 min  Charges: OT General Charges $OT Visit: 1 Visit OT Treatments $Self Care/Home Management : 8-22 mins    Britt Bottom 11/26/2020, 1:46 PM

## 2020-11-26 NOTE — Progress Notes (Signed)
Assessment & Plan: POD#26 - s/p lap assisted right colectomy for ascending colon adenocarcinoma and multiple polyps - Dr. Johney Maine 10/31/20 cont mobilization/IS/pulm toilet increase fiber supplement to BID for diarrhea hopefully stable for DC next week   Acute blood loss anemia in setting of chronic GI losses from cancer.  Hgb stable   FEN - heart healthy, fiber supplement BID VTE - Eliquis ID - Cefotetan pre-op, PO vancomycin 5/24>5/24, zosyn 5/26>5/31, eraxis 5/26>5/31, unasyn 5/31> 6/1 (for PNA)   - per [facility] - Atrial fibrillation - stable back on home meds toprol, cardizem, eliquis HTN HLD Diarrhea - c diff negative 5/23, no leukocytosis- adding fiber supplement Severe malnutrition - prealbumin 5.7>>6.1>>15.1 today Possible aspiration PNA - completed unasyn        [name], MD       [facility]       Office: [phone]   Chief Complaint: Adenocarcinoma of colon  Subjective: Patient up in chair, no complaints.  Diarrhea improved.  Wants some salt with meals.  Objective: Vital signs in last 24 hours: Temp:  [97.4 F (36.3 C)-98.7 F (37.1 C)] 98.7 F (37.1 C) (06/11 0521) Pulse Rate:  [80-109] 109 (06/11 0813) Resp:  [16] 16 (06/11 0521) BP: (117-134)/(67-76) 134/76 (06/11 0521) SpO2:  [99 %-100 %] 99 % (06/11 0521) Weight:  [59.4 kg] 59.4 kg (06/11 0500) Last BM Date: 11/25/20  Intake/Output from previous day: 06/10 0701 - 06/11 0700 In: 480 [P.O.:480] Out: -  Intake/Output this shift: Total I/O In: 240 [P.O.:240] Out: 0   Physical Exam: HEENT - sclerae clear, mucous membranes moist Neck - soft Chest - clear bilaterally Cor - RRR Abdomen - protuberant, soft, non-tender; BS present Ext - no edema, non-tender Neuro - alert & oriented, no focal deficits  Lab Results:  Recent Labs    11/24/20 0320 11/25/20 0421  WBC 8.0 7.3  HGB 9.4* 9.4*  HCT 30.8* 31.4*  PLT 351 297   BMET Recent Labs    11/25/20 0421 11/26/20 0350   NA 139 139  K 3.7 3.5  CL 105 104  CO2 27 27  GLUCOSE 92 85  BUN 25* 22  CREATININE 0.60 0.48  CALCIUM 9.3 9.6   PT/INR No results for input(s): LABPROT, INR in the last 72 hours. Comprehensive Metabolic Panel:    Component Value Date/Time   NA 139 11/26/2020 0350   NA 139 11/25/2020 0421   NA 139 02/21/2018 0854   NA 137 02/27/2017 1205   K 3.5 11/26/2020 0350   K 3.7 11/25/2020 0421   CL 104 11/26/2020 0350   CL 105 11/25/2020 0421   CO2 27 11/26/2020 0350   CO2 27 11/25/2020 0421   BUN 22 11/26/2020 0350   BUN 25 (H) 11/25/2020 0421   BUN 20 02/21/2018 0854   BUN 12 02/27/2017 1205   CREATININE 0.48 11/26/2020 0350   CREATININE 0.60 11/25/2020 0421   CREATININE 0.54 (L) 11/28/2015 1422   CREATININE 0.71 08/19/2015 0905   GLUCOSE 85 11/26/2020 0350   GLUCOSE 92 11/25/2020 0421   CALCIUM 9.6 11/26/2020 0350   CALCIUM 9.3 11/25/2020 0421   AST 20 11/24/2020 0320   AST 36 11/21/2020 0314   ALT 21 11/24/2020 0320   ALT 37 11/21/2020 0314   ALKPHOS 175 (H) 11/24/2020 0320   ALKPHOS 240 (H) 11/21/2020 0314   BILITOT 0.9 11/24/2020 0320   BILITOT 0.7 11/21/2020 0314   PROT 5.6 (L) 11/24/2020 0320   PROT 5.5 (  L) 11/21/2020 0314   ALBUMIN 2.6 (L) 11/24/2020 0320   ALBUMIN 2.4 (L) 11/21/2020 0314    Studies/Results: No results found.    [name] 11/26/2020   Patient ID: Robin [name], female   DOB: 1934/08/21, 86 y.o.   MRN: [id number]

## 2020-11-27 LAB — BASIC METABOLIC PANEL
Anion gap: 7 (ref 5–15)
BUN: 17 mg/dL (ref 8–23)
CO2: 27 mmol/L (ref 22–32)
Calcium: 9.4 mg/dL (ref 8.9–10.3)
Chloride: 103 mmol/L (ref 98–111)
Creatinine, Ser: 0.59 mg/dL (ref 0.44–1.00)
GFR, Estimated: >60 mL/min (ref >60)
Glucose, Bld: 94 mg/dL (ref 70–99)
Potassium: 2.9 mmol/L — ABNORMAL LOW (ref 3.5–5.1)
Sodium: 137 mmol/L (ref 135–145)

## 2020-11-27 MED ORDER — METOPROLOL SUCCINATE ER 25 MG PO TB24
25.0000 mg | ORAL_TABLET | Freq: Every evening | ORAL | Status: DC
  Administered 2020-11-27 – 2020-11-28 (×2): 25 mg via ORAL
  Filled 2020-11-27 (×2): qty 1

## 2020-11-27 MED ORDER — POTASSIUM CHLORIDE CRYS ER 20 MEQ PO TBCR
40.0000 meq | EXTENDED_RELEASE_TABLET | ORAL | Status: AC
  Administered 2020-11-27 (×2): 40 meq via ORAL
  Filled 2020-11-27 (×2): qty 2

## 2020-11-27 NOTE — Plan of Care (Signed)
  Problem: Health Behavior/Discharge Planning: Goal: Ability to manage health-related needs will improve Outcome: Progressing   Problem: Activity: Goal: Risk for activity intolerance will decrease Outcome: Progressing   Problem: Elimination: Goal: Will not experience complications related to bowel motility Outcome: Progressing   Problem: Pain Managment: Goal: General experience of comfort will improve Outcome: Progressing   

## 2020-11-27 NOTE — Progress Notes (Signed)
Assessment & Plan: POD#27 - s/p lap assisted right colectomy for ascending colon adenocarcinoma and multiple polyps - Dr. Johney Maine 10/31/20 mobilization/IS/pulm toilet fiber supplement BID for diarrhea Disposition - home with PT/OT versus in-patient rehab?  TBD   FEN - heart healthy, fiber supplement BID VTE - Eliquis ID - Cefotetan pre-op, PO vancomycin 5/24>5/24, zosyn 5/26>5/31, eraxis 5/26>5/31, unasyn 5/31> 6/1 (for PNA)   - per [facility] - Atrial fibrillation - stable back on home meds toprol, cardizem, eliquis HTN HLD Diarrhea - c diff negative 5/23, no leukocytosis- adding fiber supplement Severe malnutrition - prealbumin 5.7>>6.1>>15.1         [name], MD       [facility]       Office: [phone]   Chief Complaint: Colon cancer  Subjective: Patient up in chair, had breakfast.  Still weak.  Frequent BM's but more formed today.  Objective: Vital signs in last 24 hours: Temp:  [97.4 F (36.3 C)-98.7 F (37.1 C)] 98.7 F (37.1 C) (06/12 0535) Pulse Rate:  [74-106] 106 (06/12 0535) Resp:  [18] 18 (06/12 0535) BP: (120-136)/(68-74) 126/68 (06/12 0535) SpO2:  [97 %-99 %] 97 % (06/12 0535) Last BM Date: 11/27/20  Intake/Output from previous day: 06/11 0701 - 06/12 0700 In: 1080 [P.O.:1080] Out: 0  Intake/Output this shift: No intake/output data recorded.  Physical Exam: HEENT - sclerae clear, mucous membranes moist Neck - soft Abdomen - protuberant, softer; non-tender Ext - no edema, non-tender Neuro - alert & oriented, no focal deficits  Lab Results:  Recent Labs    11/25/20 0421  WBC 7.3  HGB 9.4*  HCT 31.4*  PLT 297   BMET Recent Labs    11/26/20 0350 11/27/20 0324  NA 139 137  K 3.5 2.9*  CL 104 103  CO2 27 27  GLUCOSE 85 94  BUN 22 17  CREATININE 0.48 0.59  CALCIUM 9.6 9.4   PT/INR No results for input(s): LABPROT, INR in the last 72 hours. Comprehensive Metabolic Panel:    Component Value Date/Time   NA  137 11/27/2020 0324   NA 139 11/26/2020 0350   NA 139 02/21/2018 0854   NA 137 02/27/2017 1205   K 2.9 (L) 11/27/2020 0324   K 3.5 11/26/2020 0350   CL 103 11/27/2020 0324   CL 104 11/26/2020 0350   CO2 27 11/27/2020 0324   CO2 27 11/26/2020 0350   BUN 17 11/27/2020 0324   BUN 22 11/26/2020 0350   BUN 20 02/21/2018 0854   BUN 12 02/27/2017 1205   CREATININE 0.59 11/27/2020 0324   CREATININE 0.48 11/26/2020 0350   CREATININE 0.54 (L) 11/28/2015 1422   CREATININE 0.71 08/19/2015 0905   GLUCOSE 94 11/27/2020 0324   GLUCOSE 85 11/26/2020 0350   CALCIUM 9.4 11/27/2020 0324   CALCIUM 9.6 11/26/2020 0350   AST 20 11/24/2020 0320   AST 36 11/21/2020 0314   ALT 21 11/24/2020 0320   ALT 37 11/21/2020 0314   ALKPHOS 175 (H) 11/24/2020 0320   ALKPHOS 240 (H) 11/21/2020 0314   BILITOT 0.9 11/24/2020 0320   BILITOT 0.7 11/21/2020 0314   PROT 5.6 (L) 11/24/2020 0320   PROT 5.5 (L) 11/21/2020 0314   ALBUMIN 2.6 (L) 11/24/2020 0320   ALBUMIN 2.4 (L) 11/21/2020 0314    Studies/Results: No results found.    [name] 11/27/2020   Patient ID: Robin [name], female   DOB: 06/21/1934, 86 y.o.   MRN: [id number]

## 2020-11-27 NOTE — Progress Notes (Signed)
[facility] Progress Note  Patient: Robin [name]    EML:[id number]  DOA: 10/26/2020     Date of Service: the patient was seen and examined on 11/27/2020  Brief hospital course: 86 year old female with history of paroxysmal atrial fibrillation on Eliquis, hypertension, obesity presented to the ER ED with an deficiency anemia and lower GI bleed . 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 -Has had a prolonged postop course with recurrent postop ileus  -Hospital course was also complicated by aspiration pneumonia, pulmonary edema etc.  -On 5/26 and increasing abdominal distention and vomiting CT was notable for possible pneumatosis started on IV antibiotics and TPN then  -Ileus slowly improving, off antibiotics now -Unfortunately having 8-10 BMs per day  Assessment and Plan:  Adenocarcinoma of ascending colon  Iron deficiency anemia  -Underwent colonoscopy 5/13, biopsy positive for invasive adenocarcinoma. -Underwent laparoscopic proximal hemicolectomy with reanastomosis by Dr. Johney Maine on 5/16, pathology had negative margins -Hemoglobin is stable  Prolonged postop ileus Diarrhea -Developed prolonged postop ileus requiring NG tube insertion as well as TPN. -Diet being slowly advanced, briefly treated with IV Zosyn as well, now discontinued -Tapered off TPN -Avoid antidiarrheals, increase activity as tolerated, fiber added, some of her diarrhea could be from resolving ileus, hemicolectomy and malabsorption, still having frequent stools, appears to be improving on increased fiber -Discharge planning  Aspiration pneumonia -Completed antibiotic course   Paroxysmal A. fib with RVR -Rate currently controlled. -Continue Toprol, Cardizem and Eliquis -Add p.m. dose of Toprol  Essential hypertension -Stable, continue current regimen.  Acute on chronic diastolic  CHF Volume overload from TPN.  Intermittently requiring Lasix. Echocardiogram shows preserved EF without any wall motion abnormality. -Volume status improving, still has trace edema, continue HCTZ  Hypokalemia, hypophosphatemia, hypomagnesemia. -Replaced  Body mass index is 22.48 kg/m.  Nutrition Problem: Increased nutrient needs Etiology: acute illness Interventions: Interventions: TPN  Stage II right buttock pressure ulcer. POA status not clear. Continue home dressing. Pressure Injury 11/14/20 Buttocks Right Stage 2 -  Partial thickness loss of dermis presenting as a shallow open injury with a red, pink wound bed without slough. (Active)  11/14/20 2230  Location: Buttocks  Location Orientation: Right  Staging: Stage 2 -  Partial thickness loss of dermis presenting as a shallow open injury with a red, pink wound bed without slough.  Wound Description (Comments):   Present on Admission:     DVT Prophylaxis:   apixaban (ELIQUIS) tablet 5 mg    Advance goals of care discussion: Full code  Family Communication: No family at bedside  Disposition:  Status is: Inpatient  Remains inpatient appropriate because:IV treatments appropriate due to intensity of illness or inability to take PO  Dispo: The patient is from: Home              Anticipated d/c is to: SNF              Patient currently is not medically stable to d/c.   Difficult to place patient No   Physical Exam: General: Chronically ill pleasant elderly female sitting up in bed, AAOx3, nondistressed CVS: S1-S2, regular rate rhythm Lungs: Decreased breath sounds bases Abdomen: Soft, nontender, bowel sounds present, less distended today Extremities: Trace edema  Skin: No rash on exposed skin  Vitals:   11/26/20 0813 11/26/20 1750 11/26/20 2135 11/27/20 0535  BP:  120/71 136/74 126/68  Pulse: (!) 109 88 74 (!)  106  Resp:  18 18 18   Temp:  (!) 97.4 F (36.3 C) 98.6 F (37 C) 98.7 F (37.1 C)  TempSrc:  Oral  Oral Oral  SpO2:  99% 99% 97%  Weight:      Height:        Intake/Output Summary (Last 24 hours) at 11/27/2020 1102 Last data filed at 11/27/2020 0959 Gross per 24 hour  Intake 1080 ml  Output 0 ml  Net 1080 ml   Filed Weights   11/24/20 0500 11/25/20 0500 11/26/20 0500  Weight: 63.2 kg 60 kg 59.4 kg   CBC: Recent Labs  Lab 11/21/20 0314 11/23/20 0459 11/24/20 0320 11/25/20 0421  WBC 8.7 8.8 8.0 7.3  NEUTROABS 6.7  --   --   --   HGB 9.5* 9.3* 9.4* 9.4*  HCT 32.5* 31.2* 30.8* 31.4*  MCV 82.9 82.8 81.5 82.8  PLT 387 364 351 834   Basic Metabolic Panel: Recent Labs  Lab 11/21/20 0314 11/23/20 0459 11/24/20 0320 11/25/20 0421 11/26/20 0350 11/27/20 0324  NA 138 139 136 139 139 137  K 3.9 3.8 3.7 3.7 3.5 2.9*  CL 106 109 105 105 104 103  CO2 27 25 25 27 27 27   GLUCOSE 137* 132* 126* 92 85 94  BUN 23 27* 21 25* 22 17  CREATININE 0.47 0.50 0.56 0.60 0.48 0.59  CALCIUM 9.2 8.8* 9.2 9.3 9.6 9.4  MG 2.2 2.3 2.2  --   --   --   PHOS 2.6 2.0* 2.9  --   --   --     Studies: No results found.  Scheduled Meds:  acidophilus  2 capsule Oral BID   apixaban  5 mg Oral BID   Chlorhexidine Gluconate Cloth  6 each Topical Daily   cholestyramine light  4 g Oral Q12H   diltiazem  180 mg Oral Daily   famotidine  20 mg Oral Daily   feeding supplement  237 mL Oral BID BM   ferrous sulfate  325 mg Oral BID WC   hydrochlorothiazide  25 mg Oral Daily   lactobacillus acidophilus & bulgar  1 tablet Oral TID WC   lip balm  1 application Topical BID   liver oil-zinc oxide   Topical BID   metoprolol succinate  100 mg Oral Daily   metoprolol succinate  25 mg Oral QPM   polycarbophil  625 mg Oral BID   potassium chloride  40 mEq Oral Q4H   Ensure Max Protein  11 oz Oral Daily   sodium chloride flush  10-40 mL Intracatheter Q12H   Continuous Infusions:  methocarbamol (ROBAXIN) IV     PRN Meds: acetaminophen, acetaminophen, alum & mag hydroxide-simeth, artificial tears,  bisacodyl, LORazepam, magic mouthwash, menthol-cetylpyridinium, methocarbamol (ROBAXIN) IV, ondansetron **OR** ondansetron (ZOFRAN) IV, phenol, prochlorperazine **OR** prochlorperazine, simethicone, sodium chloride flush, traMADol  Time spent: 25 minutes  Domenic Polite, MD [facility] 11/27/2020 11:02 AM

## 2020-11-28 LAB — BASIC METABOLIC PANEL
Anion gap: 6 (ref 5–15)
BUN: 16 mg/dL (ref 8–23)
CO2: 27 mmol/L (ref 22–32)
Calcium: 9.5 mg/dL (ref 8.9–10.3)
Chloride: 105 mmol/L (ref 98–111)
Creatinine, Ser: 0.54 mg/dL (ref 0.44–1.00)
GFR, Estimated: >60 mL/min (ref >60)
Glucose, Bld: 94 mg/dL (ref 70–99)
Potassium: 3.2 mmol/L — ABNORMAL LOW (ref 3.5–5.1)
Sodium: 138 mmol/L (ref 135–145)

## 2020-11-28 MED ORDER — LOPERAMIDE HCL 2 MG PO CAPS: 2.0000 mg | ORAL_CAPSULE | ORAL | Status: DC | PRN

## 2020-11-28 MED ORDER — POTASSIUM CHLORIDE CRYS ER 20 MEQ PO TBCR
40.0000 meq | EXTENDED_RELEASE_TABLET | ORAL | Status: AC
  Administered 2020-11-28 (×2): 40 meq via ORAL
  Filled 2020-11-28 (×2): qty 2

## 2020-11-28 NOTE — Progress Notes (Signed)
[facility] Progress Note  Patient: Robin [name]    DDU:[id number]  DOA: 10/26/2020     Date of Service: the patient was seen and examined on 11/28/2020  Brief hospital course: 86 year old female with history of paroxysmal atrial fibrillation on Eliquis, hypertension, obesity presented to the ER ED with an deficiency anemia and lower GI bleed . 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 -Has had a prolonged postop course with recurrent postop ileus  -Hospital course was also complicated by aspiration pneumonia, pulmonary edema etc.  -On 5/26 and increasing abdominal distention and vomiting CT was notable for possible pneumatosis started on IV antibiotics and TPN then  -Ileus slowly improving, off antibiotics  -Having frequent stools, improving with added fiber -Discharge planning, SNF declined, daughter appealing this  Assessment and Plan:  Adenocarcinoma of ascending colon  Iron deficiency anemia  -Underwent colonoscopy 5/13, biopsy positive for invasive adenocarcinoma. -Underwent laparoscopic proximal hemicolectomy with reanastomosis by Dr. Johney Maine on 5/16, pathology had negative margins -Hemoglobin is stable -Discharge planning, family appealing SNF decline  Prolonged postop ileus Diarrhea -Developed prolonged postop ileus requiring NG tube insertion as well as TPN. -Diet being slowly advanced, briefly treated with IV Zosyn as well, now discontinued -Tapered off TPN -increase activity as tolerated, fiber added, some of her diarrhea could be from resolving ileus, hemicolectomy and malabsorption, still having frequent stools, appears to be improving on increased fiber -Discharge planning, remains medically stable for discharge DC, SNF declined by insurance, family appealing this today  Aspiration pneumonia -Completed antibiotic course   Paroxysmal A. fib  with RVR -Rate currently controlled. -Continue Toprol, Cardizem and Eliquis -Added p.m. dose of Toprol  Essential hypertension -Stable, continue current regimen.  Acute on chronic diastolic CHF Volume overload from TPN.  Intermittently requiring Lasix. Echocardiogram shows preserved EF without any wall motion abnormality. -Volume status improving, still has trace edema, continue HCTZ  Hypokalemia, hypophosphatemia, hypomagnesemia. -Replace  Body mass index is 21.23 kg/m.  Nutrition Problem: Increased nutrient needs Etiology: acute illness Interventions: Interventions: TPN  Stage II right buttock pressure ulcer. POA status not clear. Continue home dressing. Pressure Injury 11/14/20 Buttocks Right Stage 2 -  Partial thickness loss of dermis presenting as a shallow open injury with a red, pink wound bed without slough. (Active)  11/14/20 2230  Location: Buttocks  Location Orientation: Right  Staging: Stage 2 -  Partial thickness loss of dermis presenting as a shallow open injury with a red, pink wound bed without slough.  Wound Description (Comments):   Present on Admission:     DVT Prophylaxis:   apixaban (ELIQUIS) tablet 5 mg    Advance goals of care discussion: Full code  Family Communication: No family at bedside, discussed with daughter on speaker phone  Disposition:  Status is: Inpatient  Remains inpatient appropriate because:IV treatments appropriate due to intensity of illness or inability to take PO  Dispo: The patient is from: Home              Anticipated d/c is to: Home health services versus SNF              Patient currently is medically stable to d/c.   Difficult to place patient No   Physical Exam: Gen: Awake, Alert, Oriented X 3, no distress HEENT: no JVD Lungs: Good air movement bilaterally, CTAB CVS: S1S2/RRR Abd: soft, Non tender, slightly distended, BS present, surgical scars unremarkable Extremities:  Trace edema Skin: no new rashes on  exposed skin   Vitals:   11/27/20 1348 11/27/20 2108 11/28/20 0448 11/28/20 0500  BP: 123/62 134/74 (!) 146/75   Pulse: (!) 106 82 89   Resp: 16 16 18    Temp: 97.6 F (36.4 C) (!) 97.4 F (36.3 C) 97.6 F (36.4 C)   TempSrc: Oral Oral Oral   SpO2: 100% 100% 100%   Weight:    56.1 kg  Height:        Intake/Output Summary (Last 24 hours) at 11/28/2020 1048 Last data filed at 11/28/2020 0825 Gross per 24 hour  Intake 720 ml  Output 0 ml  Net 720 ml   Filed Weights   11/25/20 0500 11/26/20 0500 11/28/20 0500  Weight: 60 kg 59.4 kg 56.1 kg   CBC: Recent Labs  Lab 11/23/20 0459 11/24/20 0320 11/25/20 0421  WBC 8.8 8.0 7.3  HGB 9.3* 9.4* 9.4*  HCT 31.2* 30.8* 31.4*  MCV 82.8 81.5 82.8  PLT 364 351 734   Basic Metabolic Panel: Recent Labs  Lab 11/23/20 0459 11/24/20 0320 11/25/20 0421 11/26/20 0350 11/27/20 0324 11/28/20 0328  NA 139 136 139 139 137 138  K 3.8 3.7 3.7 3.5 2.9* 3.2*  CL 109 105 105 104 103 105  CO2 25 25 27 27 27 27   GLUCOSE 132* 126* 92 85 94 94  BUN 27* 21 25* 22 17 16   CREATININE 0.50 0.56 0.60 0.48 0.59 0.54  CALCIUM 8.8* 9.2 9.3 9.6 9.4 9.5  MG 2.3 2.2  --   --   --   --   PHOS 2.0* 2.9  --   --   --   --     Studies: No results found.  Scheduled Meds:  acidophilus  2 capsule Oral BID   apixaban  5 mg Oral BID   Chlorhexidine Gluconate Cloth  6 each Topical Daily   cholestyramine light  4 g Oral Q12H   diltiazem  180 mg Oral Daily   famotidine  20 mg Oral Daily   feeding supplement  237 mL Oral BID BM   ferrous sulfate  325 mg Oral BID WC   hydrochlorothiazide  25 mg Oral Daily   lactobacillus acidophilus & bulgar  1 tablet Oral TID WC   lip balm  1 application Topical BID   liver oil-zinc oxide   Topical BID   metoprolol succinate  100 mg Oral Daily   metoprolol succinate  25 mg Oral QPM   polycarbophil  625 mg Oral BID   potassium chloride  40 mEq Oral Q4H   Ensure Max Protein  11 oz Oral Daily   sodium chloride flush   10-40 mL Intracatheter Q12H   Continuous Infusions:  methocarbamol (ROBAXIN) IV     PRN Meds: acetaminophen, acetaminophen, alum & mag hydroxide-simeth, artificial tears, bisacodyl, LORazepam, magic mouthwash, menthol-cetylpyridinium, methocarbamol (ROBAXIN) IV, ondansetron **OR** ondansetron (ZOFRAN) IV, phenol, prochlorperazine **OR** prochlorperazine, simethicone, sodium chloride flush, traMADol  Time spent: 25 minutes  Domenic Polite, MD [facility] 11/28/2020 10:48 AM

## 2020-11-28 NOTE — Telephone Encounter (Signed)
Called and spoke to patient.

## 2020-11-28 NOTE — Progress Notes (Signed)
Progress Note  28 Days Post-Op  Subjective: Eating well.  Diarrhea still present, but overall improving some.  Objective: Vital signs in last 24 hours: Temp:  [97.4 F (36.3 C)-97.6 F (36.4 C)] 97.6 F (36.4 C) (06/13 0448) Pulse Rate:  [82-106] 89 (06/13 0448) Resp:  [16-18] 18 (06/13 0448) BP: (123-146)/(62-75) 146/75 (06/13 0448) SpO2:  [100 %] 100 % (06/13 0448) Weight:  [56.1 kg] 56.1 kg (06/13 0500) Last BM Date: 11/27/20  Intake/Output from previous day: 06/12 0701 - 06/13 0700 In: 720 [P.O.:720] Out: 0  Intake/Output this shift: Total I/O In: 240 [P.O.:240] Out: -   PE: Abd: soft, NT, much less distended today, incision c/d/i   Lab Results:  No results for input(s): WBC, HGB, HCT, PLT in the last 72 hours.  BMET Recent Labs    11/27/20 0324 11/28/20 0328  NA 137 138  K 2.9* 3.2*  CL 103 105  CO2 27 27  GLUCOSE 94 94  BUN 17 16  CREATININE 0.59 0.54  CALCIUM 9.4 9.5   PT/INR No results for input(s): LABPROT, INR in the last 72 hours. CMP     Component Value Date/Time   NA 138 11/28/2020 0328   NA 139 02/21/2018 0854   K 3.2 (L) 11/28/2020 0328   CL 105 11/28/2020 0328   CO2 27 11/28/2020 0328   GLUCOSE 94 11/28/2020 0328   BUN 16 11/28/2020 0328   BUN 20 02/21/2018 0854   CREATININE 0.54 11/28/2020 0328   CREATININE 0.54 (L) 11/28/2015 1422   CALCIUM 9.5 11/28/2020 0328   PROT 5.6 (L) 11/24/2020 0320   ALBUMIN 2.6 (L) 11/24/2020 0320   AST 20 11/24/2020 0320   ALT 21 11/24/2020 0320   ALKPHOS 175 (H) 11/24/2020 0320   BILITOT 0.9 11/24/2020 0320   GFRNONAA >60 11/28/2020 0328   GFRAA 78 02/21/2018 0854   Lipase     Component Value Date/Time   LIPASE 31 10/26/2020 1919       Studies/Results: No results found.   Anti-infectives: Anti-infectives (From admission, onward)    Start     Dose/Rate Route Frequency Ordered Stop   11/16/20 1200  Ampicillin-Sulbactam (UNASYN) 3 g in sodium chloride 0.9 % 100 mL IVPB        3  g 200 mL/hr over 30 Minutes Intravenous Every 6 hours 11/16/20 0755 11/17/20 0840   11/15/20 1200  Ampicillin-Sulbactam (UNASYN) 3 g in sodium chloride 0.9 % 100 mL IVPB  Status:  Discontinued        3 g 200 mL/hr over 30 Minutes Intravenous Every 6 hours 11/15/20 1055 11/16/20 0755   11/11/20 1800  anidulafungin (ERAXIS) 100 mg in sodium chloride 0.9 % 100 mL IVPB  Status:  Discontinued       See Hyperspace for full Linked Orders Report.   100 mg 78 mL/hr over 100 Minutes Intravenous Every 24 hours 11/10/20 1622 11/15/20 0815   11/10/20 2200  piperacillin-tazobactam (ZOSYN) IVPB 3.375 g  Status:  Discontinued        3.375 g 12.5 mL/hr over 240 Minutes Intravenous Every 8 hours 11/10/20 1610 11/15/20 1035   11/10/20 1800  anidulafungin (ERAXIS) 200 mg in sodium chloride 0.9 % 200 mL IVPB       See Hyperspace for full Linked Orders Report.   200 mg 78 mL/hr over 200 Minutes Intravenous  Once 11/10/20 1622 11/11/20 0016   11/10/20 1700  anidulafungin (ERAXIS) 100 mg in sodium chloride 0.9 % 100 mL IVPB  Status:  Discontinued        100 mg 78 mL/hr over 100 Minutes Intravenous Every 24 hours 11/10/20 1610 11/10/20 1621   11/10/20 1700  piperacillin-tazobactam (ZOSYN) IVPB 3.375 g        3.375 g 100 mL/hr over 30 Minutes Intravenous  Once 11/10/20 1618 11/10/20 1720   11/08/20 0045  vancomycin (VANCOCIN) 50 mg/mL oral solution 125 mg  Status:  Discontinued        125 mg Oral Every 6 hours 11/07/20 2354 11/08/20 1304   10/31/20 2200  cefoTEtan (CEFOTAN) 2 g in sodium chloride 0.9 % 100 mL IVPB        2 g 200 mL/hr over 30 Minutes Intravenous Every 12 hours 10/31/20 1744 10/31/20 2205   10/31/20 0915  cefoTEtan (CEFOTAN) 2 g in sodium chloride 0.9 % 100 mL IVPB        2 g 200 mL/hr over 30 Minutes Intravenous On call to O.R. 10/31/20 0824 10/31/20 1312        Assessment/Plan POD#28 s/p lap assisted right hemicolectomy for ascending colon mass (adenocarcinoma) and multiple polyps (likely  pre-malignant), Dr. Johney Maine 10/31/20 -CT scan reveals contrast in the colon by the time of the scan suggesting her gut is working, but her bowels remain distended.  No nausea with soft diet -CT with some fat necrosis in the pelvis, but no other complications or abscesses noted. - TNA DC on 6/9 - C. Diff negative 5/23, no leukocytosis  -cont mobilization/IS/pulm toilet -iber supplement to BID for diarrhea. PRN low dose imodium daily if needed given her abdomen is much softer and less distended and she is eating well. -surgically stable for DC home once dispo straightened out    Acute blood loss anemia in setting of chronic GI losses from cancer.  Hgb stable   FEN - heart healthy, fiber supplement BID VTE - Eliquis ID - Cefotetan pre-op, PO vancomycin 5/24>5/24, zosyn 5/26>5/31, eraxis 5/26>5/31, unasyn 5/31> 6/1 (for PNA)   - per [facility] -  Atrial fibrillation - stable back on home meds toprol, cardizem, eliquis HTN HLD Diarrhea - c diff negative 5/23, no leukocytosis- adding fiber supplement today Severe malnutrition - prealbumin 5.7>>6.1>>15.1 today, continue TPN for now but hopefully stop soon if CT negative and diarrhea improving  Possible aspiration PNA - completed unasyn  LOS: 32 days    Robin [name], [facility] 11/28/2020, 11:21 AM Please see Amion for pager number during day hours 7:00am-4:30pm

## 2020-11-28 NOTE — TOC Progression Note (Signed)
Transition of Care [facility]) - Progression Note   Patient Details  Name: Robin [name] MRN: [id number] Date of Birth: [birth year]  Transition of Care [facility]) CM/SW [place], LCSW Phone Number: 11/28/2020, 10:25 AM  Clinical Narrative: Clinicals for appeal faxed to [facility] [phone]) per daughter's request. Daughter aware that patient will have to private pay for SNF or private duty RN services if appeal is denied as only [facility] services will be covered by Medicare. TOC awaiting determination for appeal.  Expected Discharge Plan: [place] Barriers to Discharge: Continued Medical Work up  Expected Discharge Plan and Services Expected Discharge Plan: [place] Discharge Planning Services: CM Consult Living arrangements for the past 2 months: Single Family Home  Readmission Risk Interventions Readmission Risk Prevention Plan 11/17/2020  Transportation Screening Complete  HRI or Home Care Consult Complete  Social Work Consult for [place] Planning/Counseling Complete  Palliative Care Screening Not Applicable  Medication Review Press photographer) Complete  Some recent data might be hidden

## 2020-11-28 NOTE — Progress Notes (Signed)
Physical Therapy Treatment Patient Details Name: Robin [name] MRN: [id number] DOB: 1935-05-08 Today's Date: 11/28/2020    History of Present Illness 86 year old F presented to ED for anemia and shortness of breath, and admitted for blood loss anemia due to GI bleed on 10/26/20.  Hgb 8.5 (reportedly 11 in January 2022 per daughter).  Hemoccult positive.  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 s/p laparoscopic hemicolectomy 10/31/20. Pt  with PMH of A. fib on Eliquis and HTN. Hospitilization complicated by ileus.    PT Comments    Pt progressing with PT. Pt feels more confident with RW use, has RW at home. Activity tolerance improving.  Reviewed ascending/descending stairs, pt reported feeling anxious regarding steps--provided with gait belt for home use. Continue to recommend HHPT    Follow Up Recommendations  Home health PT     Equipment Recommendations  3in1 (PT)    Recommendations for Other Services       Precautions / Restrictions Precautions Precautions: Fall Precaution Comments: abdominal surgery Restrictions Weight Bearing Restrictions: No    Mobility  Bed Mobility               General bed mobility comments: pt up in room on arrival    Transfers Overall transfer level: Needs assistance Equipment used: Rolling walker (2 wheeled);None Transfers: Sit to/from Stand Sit to Stand: Modified independent (Device/Increase time)            Ambulation/Gait Ambulation/Gait assistance: Supervision Gait Distance (Feet): 200 Feet (x2) Assistive device: Rolling walker (2 wheeled) Gait Pattern/deviations: Step-through pattern;Decreased stride length     General Gait Details: pt amb short distance in room without RW, feels more confident with UE support/RW when amb greater distance; no LOB, supervision for safety   Stairs Stairs: Yes Stairs assistance: Min assist Stair  Management: With cane;Step to pattern (HHA) Number of Stairs: 3 General stair comments: cues for step to pattern, light assist to steady. pt reports feeling anxious regarding steps   Wheelchair Mobility    Modified Rankin (Stroke Patients Only)       Balance     Sitting balance-Leahy Scale: Good     Standing balance support: No upper extremity supported;During functional activity Standing balance-Leahy Scale: Fair (fair to good)                              Cognition Arousal/Alertness: Awake/alert Behavior During Therapy: WFL for tasks assessed/performed Overall Cognitive Status: Within Functional Limits for tasks assessed                                        Exercises      General Comments        Pertinent Vitals/Pain Pain Assessment: No/denies pain    Home Living                      Prior Function            PT Goals (current goals can now be found in the care plan section) Acute Rehab PT Goals Patient Stated Goal: to get stronger PT Goal Formulation: With patient Time For Goal Achievement: 12/05/20 Potential to Achieve Goals: Good Progress towards PT goals: Progressing toward goals    Frequency    Min 3X/week  PT Plan Discharge plan needs to be updated    Co-evaluation              AM-PAC PT "6 Clicks" Mobility   Outcome Measure  Help needed turning from your back to your side while in a flat bed without using bedrails?: A Little Help needed moving from lying on your back to sitting on the side of a flat bed without using bedrails?: None Help needed moving to and from a bed to a chair (including a wheelchair)?: None Help needed standing up from a chair using your arms (e.g., wheelchair or bedside chair)?: None Help needed to walk in hospital room?: None Help needed climbing 3-5 steps with a railing? : A Little 6 Click Score: 22    End of Session Equipment Utilized During Treatment: Gait  belt Activity Tolerance: Patient tolerated treatment well Patient left: with call bell/phone within reach;in chair   PT Visit Diagnosis: Difficulty in walking, not elsewhere classified (R26.2)     Time: 1003-4961 PT Time Calculation (min) (ACUTE ONLY): 24 min  Charges:  $Gait Training: 23-37 mins                     Baxter Flattery, PT  Acute Rehab Dept ([place]) [phone] Pager [phone]  11/28/2020    [facility] 11/28/2020, 11:54 AM

## 2020-11-29 LAB — RESP PANEL BY RT-PCR (FLU A&B, COVID) ARPGX2
Influenza A by PCR: NEGATIVE
Influenza B by PCR: NEGATIVE
SARS Coronavirus 2 by RT PCR: NEGATIVE

## 2020-11-29 MED ORDER — CALCIUM POLYCARBOPHIL 625 MG PO TABS: 625.0000 mg | ORAL_TABLET | Freq: Two times a day (BID) | ORAL | 10 refills | Status: DC

## 2020-11-29 MED ORDER — CHOLESTYRAMINE LIGHT 4 G PO PACK: 4.0000 g | PACK | Freq: Two times a day (BID) | ORAL | 10 refills | Status: DC

## 2020-11-29 MED ORDER — METOPROLOL SUCCINATE ER 50 MG PO TB24: 25.0000 mg | ORAL_TABLET | Freq: Two times a day (BID) | ORAL | 0 refills | Status: DC

## 2020-11-29 MED ORDER — LOPERAMIDE HCL 2 MG PO CAPS
2.0000 mg | ORAL_CAPSULE | ORAL | Status: DC | PRN
  Administered 2020-11-29: 2 mg via ORAL
  Filled 2020-11-29: qty 1

## 2020-11-29 MED ORDER — ACETAMINOPHEN 325 MG PO TABS: 650.0000 mg | ORAL_TABLET | Freq: Four times a day (QID) | ORAL | Status: AC | PRN

## 2020-11-29 MED ORDER — ONDANSETRON 4 MG PO TBDP: 4.0000 mg | ORAL_TABLET | Freq: Three times a day (TID) | ORAL | 0 refills | Status: AC | PRN

## 2020-11-29 MED ORDER — DIPHENOXYLATE-ATROPINE 2.5-0.025 MG PO TABS: 1.0000 | ORAL_TABLET | Freq: Two times a day (BID) | ORAL | 0 refills | Status: DC | PRN

## 2020-11-29 MED ORDER — FERROUS SULFATE 325 (65 FE) MG PO TABS: 325.0000 mg | ORAL_TABLET | Freq: Two times a day (BID) | ORAL | 0 refills | Status: DC

## 2020-11-29 MED ORDER — POTASSIUM CHLORIDE CRYS ER 20 MEQ PO TBCR: 20.0000 meq | EXTENDED_RELEASE_TABLET | Freq: Two times a day (BID) | ORAL | Status: DC

## 2020-11-29 MED ORDER — LOPERAMIDE HCL 2 MG PO CAPS: 2.0000 mg | ORAL_CAPSULE | ORAL | 0 refills | Status: DC | PRN

## 2020-11-29 MED ORDER — POTASSIUM CHLORIDE CRYS ER 20 MEQ PO TBCR
40.0000 meq | EXTENDED_RELEASE_TABLET | Freq: Every day | ORAL | Status: DC
  Administered 2020-11-29: 40 meq via ORAL
  Filled 2020-11-29: qty 2

## 2020-11-29 MED ORDER — DIPHENOXYLATE-ATROPINE 2.5-0.025 MG PO TABS: 1.0000 | ORAL_TABLET | Freq: Two times a day (BID) | ORAL | Status: DC | PRN

## 2020-11-29 MED ORDER — DIPHENOXYLATE-ATROPINE 2.5-0.025 MG PO TABS: 1.0000 | ORAL_TABLET | Freq: Two times a day (BID) | ORAL | 10 refills | Status: DC | PRN

## 2020-11-29 MED ORDER — LACTINEX PO CHEW: 1.0000 | CHEWABLE_TABLET | Freq: Three times a day (TID) | ORAL | Status: DC

## 2020-11-29 NOTE — Progress Notes (Signed)
Robin [name] [id number] 1934-12-23  CARE TEAM:  PCP: [name], PA-C  Outpatient Care Team: Patient Care Team: [name], PA-C as PCP - General (Family Medicine) Skeet Latch, MD as PCP - Cardiology (Cardiology) Milus Banister, MD as Attending Physician (Gastroenterology) [name], MD as Consulting Physician (General Surgery)  Inpatient Treatment Team: Treatment Team: Attending Provider: Domenic Polite, MD; Rounding Team: Nolon Nations, MD; Consulting Physician: [name], MD; Registered Nurse: Lovina Reach, RN; Technician: Ernest Mallick, [place]; Rounding Team: Redmond Baseman, MD; Technician: Jeris Penta, NT; Registered Nurse: Arminda Resides, RN; Occupational Therapist: Rosemary Holms, OT; Registered Nurse: Orlena Sheldon, RN; Pharmacist: Luiz Ochoa, [facility]   Problem List:   Principal Problem:   Cecal cancer & polyps s/p lap hemicolectomy 10/31/2020 Active Problems:   Irritable bowel syndrome with diarrhea   High grade dysplasia in colonic adenomas transverse colon & splenic flexure   Paroxysmal atrial fibrillation (HCC)   Chronic anticoagulation   Venous insufficiency (chronic) (peripheral)   Anemia   GI bleed   Colonic mass   Colon cancer (HCC)   Hypophosphatemia   Protein-calorie malnutrition, severe (HCC)   Pressure injury of skin   29 Days Post-Op  10/31/2020  POST-OPERATIVE DIAGNOSIS:   CECAL COLON CANCER TRANSVERSE COLON POLYPS WITH HIGH GRADE DYSPLASIA & POSSIBLE ADENOCARCINOMA IRON DEFICIENCY ANEMIA FROM LOWER GI BLOOD LOSS   PROCEDURE:   LAPAROSCOPIC PROXIMAL HEMICOLECTOMY  OMENTOPEXY OF ANASTOMOSIS LAPAROSCOPIC LYSIS OF ADHESIONS TRANSVERSUS ABDOMINIS PLANE (TAP) BLOCK - BILATERAL   SURGEON:  Adin Hector, MD   OR FINDINGS:    CASE DATA:   Type of patient?: LDOW CASE (Surgical Hospitalist [facility] Inpatient)   Status of Case? URGENT Add On   Infection Present At Time Of Surgery (PATOS)?  NO    Patient had moderately significant intra-abdominal adhesions from her prior open cholecystectomy as well as pelvic surgeries.   Obvious bulky mass in the cecum near the ileocecal valve.  Tattooing actually at the mid transverse colon.  She had a rather redundant stretched out colon.  Specimen opened on the back table and clearly the tattooed region had some fibrosis and thickening through the colon wall suspicious for some residual cancer.   No obvious metastatic disease on visceral parietal peritoneum or liver.   It is an isoperistaltic anastomosis between terminal ileum and distal transverse colon resting in the left upper quadrant.     Assessment  ILEUS  [facility] Stay = 33 days)  Assessment/Plan:  Assessment/Plan POD#28 s/p lap assisted right hemicolectomy for ascending colon mass (adenocarcinoma) and multiple polyps (likely pre-malignant), Dr. Johney Maine 10/31/20 -CT scan reveals contrast in the colon by the time of the scan suggesting her gut is working, but her bowels remain distended.  No nausea with soft diet -CT with some fat necrosis in the pelvis, but no other complications or abscesses noted. - TNA DC on 6/9 - C. Diff negative 5/23, no leukocytosis -cont mobilization/IS/pulm toilet -Fiber supplement & Questran BID for diarrhea. PRN low dose lomotil as needed  -surgically stable for DC home once dispo straightened out     Acute blood loss anemia in setting of chronic GI losses from cancer.  Hgb in 9s stable back on full anticoagulation & stable.  Consider IV iron - defer to [facility] since performance status improving   FEN - heart healthy, fiber supplement BID VTE - Eliquis ID - Cefotetan pre-op, PO vancomycin 5/24>5/24, zosyn 5/26>5/31, eraxis 5/26>5/31, unasyn 5/31> 6/1 (for PNA).  No need for Abx from surgery standpoint - Possible aspiration PNA - completed unasyn 6/1- per [facility]   Atrial fibrillation - stable back on home meds toprol, cardizem, eliquis HTN HLD Diarrhea - c  diff negative 5/23, no leukocytosis- adding fiber supplement today  Severe malnutrition - prealbumin 5.7>>6.1>>15.1 today, continue TPN for now but hopefully stop soon if CT negative and diarrhea improving      30 minutes spent in review, evaluation, examination, counseling, and coordination of care.   I have reviewed this patient's available data, including medical history, events of note, physical examination and test results as part of my evaluation.  A significant portion of that time was spent in counseling.  Care during the described time interval was provided by me.  11/29/2020    Subjective: (Chief complaint)  Feeling stronger Tol PO BM more thick Hoping to d/c soon.  Wants to go back to home alone.    Objective:  Vital signs:  Vitals:   11/28/20 1408 11/28/20 2055 11/28/20 2233 11/29/20 0446  BP: 118/71 128/75  125/70  Pulse: 75 85  71  Resp: 16 16  18   Temp: 97.7 F (36.5 C) 98.3 F (36.8 C)  98.1 F (36.7 C)  TempSrc: Oral Oral  Oral  SpO2: 100% 99%  99%  Weight:   59.4 kg   Height:        Last BM Date: 11/28/20  Intake/Output   Yesterday:  06/13 0701 - 06/14 0700 In: 1220 [P.O.:1220] Out: -  This shift:  No intake/output data recorded.  Bowel function:  Flatus: YES  BM:  YES  Drain: (No drain)   Physical Exam:  General: Pt awake/alert in no acute distress.  Alert & bright - this is the best I have seen her. Eyes: PERRL, normal EOM.  Sclera clear.  No icterus Neuro: CN II-XII intact w/o focal sensory/motor deficits. Lymph: No head/neck/groin lymphadenopathy Psych:  No delerium/psychosis/paranoia.  Oriented x 4 HENT: Normocephalic, Mucus membranes moist.  No thrush.  Sore throat Neck: Supple, No tracheal deviation.  No obvious thyromegaly Chest: No pain to chest wall compression.  Good respiratory excursion.  No audible wheezing CV:  Pulses intact.  Regular rhythm.  No major extremity edema MS: Normal AROM mjr joints.  No obvious  deformity  Abdomen: Soft.  Nondistended.  Nontender.  Incisions c/d/I.  No evidence of peritonitis.  No incarcerated hernias.  Ext:  No deformity.  No mjr edema.  No cyanosis Skin: No petechiae / purpurea.  No major sores.  Warm and dry    Results:   Cultures: Recent Results (from the past 720 hour(s))  Surgical pcr screen     Status: None   Collection Time: 10/31/20 10:19 AM   Specimen: Nasal Mucosa; Nasal Swab  Result Value Ref Range Status   MRSA, PCR NEGATIVE NEGATIVE Final   Staphylococcus aureus NEGATIVE NEGATIVE Final    Comment: (NOTE) The Xpert SA Assay (FDA approved for NASAL specimens in patients 41 years of age and older), is one component of a comprehensive surveillance program. It is not intended to diagnose infection nor to guide or monitor treatment. Performed at [facility], [place] [address] [place], [place] [address]   C Difficile Quick Screen (NO PCR Reflex)     Status: None   Collection Time: 11/07/20  2:01 PM   Specimen: STOOL  Result Value Ref Range Status   C Diff antigen NEGATIVE NEGATIVE Final   C Diff toxin NEGATIVE NEGATIVE Final  C Diff interpretation No C. difficile detected.  Final    Comment: Performed at [facility], [place] [address] [place], [place] [address]  Culture, blood (routine x 2)     Status: None   Collection Time: 11/10/20  8:17 AM   Specimen: BLOOD  Result Value Ref Range Status   Specimen Description   Final    BLOOD BLOOD RIGHT FOREARM Performed at [facility] [address] [place], [place] [address]    Special Requests   Final    BOTTLES DRAWN AEROBIC AND ANAEROBIC Blood Culture adequate volume Performed at [facility] [address], [place], [place] [address]    Culture   Final    NO GROWTH 5 DAYS Performed at [facility], [place] [address], [place], [place] [address]    Report Status 11/15/2020 FINAL  Final  Culture, blood  (routine x 2)     Status: None   Collection Time: 11/10/20  8:21 AM   Specimen: BLOOD  Result Value Ref Range Status   Specimen Description   Final    BLOOD BLOOD LEFT FOREARM Performed at [facility] [address] [place], [place] [address]    Special Requests   Final    BOTTLES DRAWN AEROBIC AND ANAEROBIC Blood Culture adequate volume Performed at [facility] [address] [place], [place] [address]    Culture   Final    NO GROWTH 5 DAYS Performed at [facility], [place] [address], [place], [place] [address]    Report Status 11/15/2020 FINAL  Final    Labs: Results for orders placed or performed during the hospital encounter of 10/26/20 (from the past 48 hour(s))  Basic metabolic panel     Status: Abnormal   Collection Time: 11/28/20  3:28 AM  Result Value Ref Range   Sodium 138 135 - 145 mmol/L   Potassium 3.2 (L) 3.5 - 5.1 mmol/L   Chloride 105 98 - 111 mmol/L   CO2 27 22 - 32 mmol/L   Glucose, Bld 94 70 - 99 mg/dL    Comment: Glucose reference range applies only to samples taken after fasting for at least 8 hours.   BUN 16 8 - 23 mg/dL   Creatinine, Ser 0.54 0.44 - 1.00 mg/dL   Calcium 9.5 8.9 - 10.3 mg/dL   GFR, Estimated >60 >60 mL/min    Comment: (NOTE) Calculated using the CKD-EPI Creatinine Equation (2021)    Anion gap 6 5 - 15    Comment: Performed at [facility], [place] [address] [place], [place] [address]    Imaging / Studies: No results found.   Medications / Allergies: per chart  Antibiotics: Anti-infectives (From admission, onward)    Start     Dose/Rate Route Frequency Ordered Stop   11/16/20 1200  Ampicillin-Sulbactam (UNASYN) 3 g in sodium chloride 0.9 % 100 mL IVPB        3 g 200 mL/hr over 30 Minutes Intravenous Every 6 hours 11/16/20 0755 11/17/20 0840   11/15/20 1200  Ampicillin-Sulbactam (UNASYN) 3 g in sodium chloride 0.9 % 100 mL IVPB  Status:  Discontinued        3  g 200 mL/hr over 30 Minutes Intravenous Every 6 hours 11/15/20 1055 11/16/20 0755   11/11/20 1800  anidulafungin (ERAXIS) 100 mg in sodium chloride 0.9 % 100 mL IVPB  Status:  Discontinued       See  Hyperspace for full Linked Orders Report.   100 mg 78 mL/hr over 100 Minutes Intravenous Every 24 hours 11/10/20 1622 11/15/20 0815   11/10/20 2200  piperacillin-tazobactam (ZOSYN) IVPB 3.375 g  Status:  Discontinued        3.375 g 12.5 mL/hr over 240 Minutes Intravenous Every 8 hours 11/10/20 1610 11/15/20 1035   11/10/20 1800  anidulafungin (ERAXIS) 200 mg in sodium chloride 0.9 % 200 mL IVPB       See Hyperspace for full Linked Orders Report.   200 mg 78 mL/hr over 200 Minutes Intravenous  Once 11/10/20 1622 11/11/20 0016   11/10/20 1700  anidulafungin (ERAXIS) 100 mg in sodium chloride 0.9 % 100 mL IVPB  Status:  Discontinued        100 mg 78 mL/hr over 100 Minutes Intravenous Every 24 hours 11/10/20 1610 11/10/20 1621   11/10/20 1700  piperacillin-tazobactam (ZOSYN) IVPB 3.375 g        3.375 g 100 mL/hr over 30 Minutes Intravenous  Once 11/10/20 1618 11/10/20 1720   11/08/20 0045  vancomycin (VANCOCIN) 50 mg/mL oral solution 125 mg  Status:  Discontinued        125 mg Oral Every 6 hours 11/07/20 2354 11/08/20 1304   10/31/20 2200  cefoTEtan (CEFOTAN) 2 g in sodium chloride 0.9 % 100 mL IVPB        2 g 200 mL/hr over 30 Minutes Intravenous Every 12 hours 10/31/20 1744 10/31/20 2205   10/31/20 0915  cefoTEtan (CEFOTAN) 2 g in sodium chloride 0.9 % 100 mL IVPB        2 g 200 mL/hr over 30 Minutes Intravenous On call to O.R. 10/31/20 0824 10/31/20 1312         Note: Portions of this report may have been transcribed using voice recognition software. Every effort was made to ensure accuracy; however, inadvertent computerized transcription errors may be present.   Any transcriptional errors that result from this process are unintentional.    Adin Hector, MD, FACS,  MASCRS  Esophageal, Gastrointestinal & Colorectal Surgery Robotic and Minimally Invasive Surgery [facility] [address], [place], [place] [address] [phone] Fax [phone] Main/Paging  CONTACT INFORMATION: Weekday (9AM-5PM) concerns: Call [facility] main office at [phone] Weeknight (5PM-9AM) or Weekend/Holiday concerns: Check [email or web address] for General Surgery [facility] coverage (Please, do not use SecureChat as it is not reliable communication to operating surgeons for immediate patient care)      11/29/2020  8:10 AM

## 2020-11-29 NOTE — Progress Notes (Signed)
Pt alert and oriented, tolerating diet. D/C instructions given, daughter taking her to the facility.

## 2020-11-29 NOTE — TOC Transition Note (Signed)
Transition of Care [facility]) - CM/SW Discharge Note  Patient Details  Name: Robin [name] MRN: [id number] Date of Birth: Dec 31, 1934  Transition of [facility]) CM/SW Contact:  Sherie Don, LCSW Phone Number: 11/29/2020, 2:04 PM  Clinical Narrative: CSW called [employer] to follow up with patient's appeal. Patient's appeal is still pending. The reference # is [id number] and the case # is [id number]. CSW updated daughter and explained patient is medically stable for discharge. Daughter is aware the patient will be private pay at [facility] unless the denial is overturned. Daughter to transport patient to [name]. CSW called [name] with [name] to confirm patient can come to SNF under private pay pending negative COVID test. Discharge summary, discharge orders, and SNF transfer report faxed to facility in hub. COVID test is negative. TOC signing off.  Final next level of care: Skilled Nursing Facility Barriers to Discharge: Barriers Resolved  Patient Goals and CMS Choice Patient states their goals for this hospitalization and ongoing recovery are:: go home CMS Medicare.gov Compare Post Acute Care list provided to:: Patient Choice offered to / list presented to : Patient  Discharge Placement PASRR number recieved: 11/17/20         Patient chooses bed at: [place] Patient to be transferred to facility by: Daughter Name of family member notified: Salena Saner (daughter) Patient and family notified of of transfer: 11/29/20  Discharge Plan and Services Discharge Planning Services: CM Consult        DME Arranged: N/A DME Agency: NA  Readmission Risk Interventions Readmission Risk Prevention Plan 11/17/2020  Transportation Screening Complete  HRI or Home Care Consult Complete  Social Work Consult for [place] Planning/Counseling Complete  Palliative Care Screening Not Applicable  Medication Review Press photographer) Complete  Some recent data might be hidden

## 2020-11-29 NOTE — Progress Notes (Signed)
Occupational Therapy Treatment Patient Details Name: Robin [name] MRN: [id number] DOB: 18-Jan-1935 Today's Date: 11/29/2020    History of present illness 86 year old F presented to ED for anemia and shortness of breath, and admitted for blood loss anemia due to GI bleed on 10/26/20.  Hgb 8.5 (reportedly 11 in January 2022 per daughter).  Hemoccult positive.  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 s/p laparoscopic hemicolectomy 10/31/20. Pt  with PMH of A. fib on Eliquis and HTN. Hospitilization complicated by ileus.   OT comments  Patient reports discharging today at 1500, discuss remaining questions/concerns regarding ADLs at home. Patient wanting to practice shower transfer, patient supervision level with min cues to problem solve safety with grab bars as patient does have some in shower at home. Patient reports her main concerns is her lower extremity weakness and not wanting to fall. Educated patient on energy conservation techniques such as placing chair in long hallway or use of rollator to ensure having a seat however patient is unsure if would fit through her hallway. Educate patient on working with [facility] for additional home modifications as needed to maximize safety and energy conservation until she feels leg strength has improved.    Follow Up Recommendations  Home health OT    Equipment Recommendations  Other (comment) (reacher)       Precautions / Restrictions Precautions Precaution Comments: abdominal surgery Restrictions Weight Bearing Restrictions: No       Mobility Bed Mobility               General bed mobility comments: OOB    Transfers Overall transfer level: Modified independent               General transfer comment: holds onto furniture to steady while amblating in room    Balance Overall balance assessment: Mild deficits observed, not formally tested                                          ADL either performed or assessed with clinical judgement   ADL Overall ADL's : Needs assistance/impaired Eating/Feeding: Independent   Grooming: Wash/dry face;Wash/dry hands;Independent;Standing                   Toilet Transfer: Independent;Ambulation   Toileting- Clothing Manipulation and Hygiene: Independent;Sitting/lateral lean;Sit to/from stand   Tub/ Shower Transfer: Walk-in Public librarian Details (indicate cue type and reason): educate patient and problem solve how to step in/out of shower at home including safe use of grab bars Functional mobility during ADLs: Independent General ADL Comments: educate patient on home modifcations such as placement of chair in long hallway for energy conservation as patient's main concerns is her LE weakness "I don't want to fall" also educate patient on benefits of rollator and working with [facility] on further home recommendations to maximize safety/energy conservation as she builds strength      Cognition Arousal/Alertness: Awake/alert Behavior During Therapy: WFL for tasks assessed/performed Overall Cognitive Status: Within Functional Limits for tasks assessed                                                     Pertinent Vitals/ Pain  Pain Assessment: No/denies pain         Frequency  Min 2X/week        Progress Toward Goals  OT Goals(current goals can now be found in the care plan section)  Progress towards OT goals: Progressing toward goals  Acute Rehab OT Goals Patient Stated Goal: to get stronger OT Goal Formulation: With patient Time For Goal Achievement: 12/13/20 Potential to Achieve Goals: Good ADL Goals Additional ADL Goal #1: Patient will tolerate 10 minutes of standing activity at mod I level in order to safely participate in daily routine  Plan Discharge plan needs to be updated       AM-PAC OT  "6 Clicks" Daily Activity     Outcome Measure   Help from another person eating meals?: None Help from another person taking care of personal grooming?: None Help from another person toileting, which includes using toliet, bedpan, or urinal?: None Help from another person bathing (including washing, rinsing, drying)?: A Little Help from another person to put on and taking off regular upper body clothing?: None Help from another person to put on and taking off regular lower body clothing?: None 6 Click Score: 23    End of Session  OT Visit Diagnosis: Other abnormalities of gait and mobility (R26.89)   Activity Tolerance Patient tolerated treatment well   Patient Left in chair;with call bell/phone within reach   Nurse Communication Mobility status        Time: 5366-4403 OT Time Calculation (min): 26 min  Charges: OT General Charges $OT Visit: 1 Visit OT Treatments $Self Care/Home Management : 23-37 mins Delbert Phenix OT OT pager: [place] 11/29/2020, 12:16 PM

## 2020-11-29 NOTE — Discharge Summary (Signed)
Physician Discharge Summary  Robin [name] AST:[id number] DOB: 03-29-1935 DOA: 10/26/2020  PCP: [name], PA-C  Admit date: 10/26/2020 Discharge date: 11/29/2020  Time spent: 76minutes  Recommendations for Outpatient Follow-up:  General surgery Dr. Johney Maine on 7/11 PCP in 1 to 2 weeks   Discharge Diagnoses:  Principal Problem:   Cecal cancer & polyps s/p lap hemicolectomy 10/31/2020 Prolonged postop ileus Chronic diarrhea   Paroxysmal atrial fibrillation (HCC)   Chronic anticoagulation   Venous insufficiency (chronic) (peripheral)   Anemia   GI bleed   Colonic mass   High grade dysplasia in colonic adenomas transverse colon & splenic flexure   Colon cancer (HCC)   Hypophosphatemia   Protein-calorie malnutrition, severe (HCC)   Pressure injury of skin   Irritable bowel syndrome with diarrhea   Discharge Condition: Improved  Diet recommendation: Low-sodium  Filed Weights   11/26/20 0500 11/28/20 0500 11/28/20 2233  Weight: 59.4 kg 56.1 kg 59.4 kg    History of present illness:  86 year old female with history of paroxysmal atrial fibrillation on Eliquis, hypertension, obesity presented to the ER ED with an deficiency anemia and lower GI bleed .  Hospital Course:   Adenocarcinoma of ascending colon Iron deficiency anemia -Underwent colonoscopy 5/13, biopsy positive for invasive adenocarcinoma. -Underwent laparoscopic proximal hemicolectomy with reanastomosis by Dr. Johney Maine on 5/16, pathology had negative margins -Hemoglobin is stable -Follow-up with Dr. Johney Maine on 7/11   Prolonged postop ileus Chronic diarrhea -Developed prolonged postop ileus requiring NG tube insertion as well as TPN. -Diet gradually advance -Tapered off TPN - fiber added, some of her diarrhea could be from resolving ileus, hemicolectomy and chronic,  still having frequent stools, but overall much improved on increased fiber -Per surgical team, low-dose Lomotil added as needed to her home  medicines, stable for discharge at this time   Aspiration pneumonia -Completed antibiotic course   Paroxysmal A. fib with RVR -Rate currently controlled. -Continue Toprol-100 mg in a.m., Cardizem and Eliquis -Added p.m. dose of Toprol 25 mg   Essential hypertension -Stable, continue current regimen.   Acute on chronic diastolic CHF Volume overload from TPN.  Intermittently requiring Lasix. Echocardiogram shows preserved EF without any wall motion abnormality. -Volume status improving, still has trace edema, continue HCTZ at discharge   Hypokalemia, hypophosphatemia, hypomagnesemia. -Replaced   Body mass index is 21.23 kg/m.  Nutrition Problem: Increased nutrient needs Etiology: acute illness Interventions: Interventions: TPN   Stage II right buttock pressure ulcer. POA status not clear. Continue home dressing. Pressure Injury 11/14/20 Buttocks Right Stage 2 -  Partial thickness loss of dermis presenting as a shallow open injury with a red, pink wound bed without slough. (Active)  11/14/20 2230  Location: Buttocks  Location Orientation: Right  Staging: Stage 2 -  Partial thickness loss of dermis presenting as a shallow open injury with a red, pink wound bed without slough.  Wound Description (Comments):  Present on Admission:      Discharge Exam: Vitals:   11/28/20 2055 11/29/20 0446  BP: 128/75 125/70  Pulse: 85 71  Resp: 16 18  Temp: 98.3 F (36.8 C) 98.1 F (36.7 C)  SpO2: 99% 99%    General: AAOx3, no distress Cardiovascular: S1-S2, regular rate rhythm Respiratory: Clear  Discharge Instructions    Allergies as of 11/29/2020       Reactions   Iodinated Diagnostic Agents Shortness Of Breath   Amlodipine    Shortness of breath   Codeine Nausea Only   Diphenhydramine Other (See Comments)  hypertension   Diphenhydramine Hcl    Hypertension   Pravastatin    Muscle aches   Zetia [ezetimibe]    Sulfa Antibiotics Rash   Sulfa Drugs Cross Reactors  Rash        Medication List     TAKE these medications    acetaminophen 325 MG tablet Commonly known as: TYLENOL Take 2 tablets (650 mg total) by mouth every 6 (six) hours as needed for mild pain, fever or headache.   atorvastatin 10 MG tablet Commonly known as: LIPITOR Take 0.5 tablets by mouth 3 (three) times a week.   cholestyramine light 4 g packet Commonly known as: PREVALITE Take 1 packet (4 g total) by mouth every 12 (twelve) hours.   CULTURELLE PO Take 1 capsule by mouth every morning.   diphenoxylate-atropine 2.5-0.025 MG tablet Commonly known as: LOMOTIL Take 1 tablet by mouth every 12 (twelve) hours as needed for diarrhea or loose stools (for diarrhea). What changed:  when to take this reasons to take this   diphenoxylate-atropine 2.5-0.025 MG tablet Commonly known as: LOMOTIL Take 1-2 tablets by mouth every 12 (twelve) hours as needed for diarrhea or loose stools. What changed: You were already taking a medication with the same name, and this prescription was added. Make sure you understand how and when to take each.   Eliquis 5 MG Tabs tablet Generic drug: apixaban TAKE 1 TABLET BY MOUTH TWICE DAILY.   estradiol 1 MG tablet Commonly known as: ESTRACE Take 0.5 mg by mouth daily.   ferrous sulfate 325 (65 FE) MG tablet Take 1 tablet (325 mg total) by mouth 2 (two) times daily with a meal.   lactobacillus acidophilus & bulgar chewable tablet Chew 1 tablet by mouth 3 (three) times daily with meals.   loperamide 2 MG capsule Commonly known as: IMODIUM Take 1 capsule (2 mg total) by mouth as needed for diarrhea or loose stools.   Lutein 20 MG Caps Take 1 capsule by mouth every evening.   metoprolol succinate 50 MG 24 hr tablet Commonly known as: TOPROL-XL Take 0.5-2 tablets (25-100 mg total) by mouth 2 (two) times daily. Take 100mg  in am and 25mg  in evening What changed:  how much to take additional instructions   polycarbophil 625 MG  tablet Commonly known as: FIBERCON Take 1 tablet (625 mg total) by mouth 2 (two) times daily.   potassium chloride SA 20 MEQ tablet Commonly known as: KLOR-CON Take 1 tablet (20 mEq total) by mouth 2 (two) times daily. What changed: when to take this   tiZANidine 2 MG tablet Commonly known as: ZANAFLEX Take 2 mg by mouth every 8 (eight) hours as needed for muscle spasms.   triamcinolone cream 0.1 % Commonly known as: KENALOG Apply 1 application topically daily as needed (for rash).   triamterene-hydrochlorothiazide 37.5-25 MG tablet Commonly known as: MAXZIDE-25 Take 1 tablet by mouth daily.   vitamin A 10000 UNIT capsule Take 10,000 Units by mouth daily.   Vitamin D 50 MCG (2000 UT) tablet Take 2,000 Units by mouth at bedtime.       ASK your doctor about these medications    Tiadylt ER 180 MG 24 hr capsule Generic drug: diltiazem TAKE (1) CAPSULE DAILY.               Durable Medical Equipment  (From admission, onward)           Start     Ordered   11/09/20 1548  For home use only  DME 3 n 1  Once        11/09/20 1547           Allergies  Allergen Reactions   Iodinated Diagnostic Agents Shortness Of Breath   Amlodipine     Shortness of breath   Codeine Nausea Only   Diphenhydramine Other (See Comments)    hypertension   Diphenhydramine Hcl     Hypertension    Pravastatin     Muscle aches   Zetia [Ezetimibe]    Sulfa Antibiotics Rash   Sulfa Drugs Cross Reactors Rash    Follow-up Information     [name], MD. Go on 12/26/2020.   Specialties: General Surgery, Colon and Rectal Surgery Why: Your appointment is 7/11 @ 11:45 Please arrive 30 minutes prior to your appointment to check in and fill out paperwork. Bring photo ID and Doctor, general practice information: [facility] [place] [id number] [phone]                  The results of significant diagnostics from this hospitalization (including  imaging, microbiology, ancillary and laboratory) are listed below for reference.    Significant Diagnostic Studies: CT ABDOMEN PELVIS WO CONTRAST  Result Date: 11/21/2020 CLINICAL DATA:  Bowel obstruction suspected. Ileus. Laparoscopic hemicolectomy 10/31/2020. EXAM: CT ABDOMEN AND PELVIS WITHOUT CONTRAST TECHNIQUE: Multidetector CT imaging of the abdomen and pelvis was performed following the standard protocol without IV contrast. COMPARISON:  Postoperative CT 11/10/2020. Preoperative CT 10/28/2020 FINDINGS: Lower chest: Resolved left basilar opacity from prior exam. Similar appearing reticulation and nodularity in the anterior right lower and right middle lobes appear to represent scarring. Hepatobiliary: No evidence of focal liver abnormality on this noncontrast exam. Previous portal venous gas has resolved. Cholecystectomy. No definite biliary dilatation, common bile duct not well visualized. Pancreas: Mild parenchymal atrophy. No ductal dilatation or inflammation. Spleen: Normal in size without focal abnormality. Adrenals/Urinary Tract: No adrenal nodule. No hydronephrosis or perinephric edema. Left peripelvic and right renal cortical cyst. The urinary bladder is near completely empty and not well assessed. Stomach/Bowel: No abnormal gastric distension. Normal positioning of the duodenum and ligament of Treitz. Status post right hemicolectomy with enteric sutures in the mid transverse colon. Small bowel loops are diffusely dilated and contrast filled, however enteric contrast reaches the rectosigmoid colon, no obstruction occasional areas of small bowel thickening, including in the right lower quadrant adjacent to an area of presumed fat necrosis, for example coronal series 4, image 34. the previous small bowel pneumatosis is no longer seen. Vascular/Lymphatic: Aortic atherosclerosis. No aortic aneurysm. There is no portal venous or mesenteric gas. No bulky abdominopelvic adenopathy. Reproductive: Uterus is  surgically absent. There is no adnexal mass. Other: There is a rounded area of fatty inflammation in the pelvis just to the right of midline with small amount of free fluid suspicious for fat necrosis. This spans approximately 6.2 cm, series 2, image 64. This is more coalescent than seen on prior exam. There is a small volume of free fluid in the posterior pelvis but no loculated collection. No free air. Minimal subcutaneous edema. Musculoskeletal: Chronic T12, L2 and minimal L3 superior endplate compression fractures. Multilevel degenerative change in the spine. No acute osseous abnormalities are seen. IMPRESSION: 1. Post right hemicolectomy with enteric sutures in the mid transverse colon. Small bowel is dilated, however no evidence of obstruction with enteric contrast reaching the rectosigmoid colon. Previous small bowel pneumatosis and portal venous gas has resolved. No evidence of bowel  obstruction. 2. Occasional areas of small bowel thickening in the pelvis adjacent to presumed fat necrosis described below. 3. A rounded area of fatty inflammation in the pelvis just to the right of midline with small amount of free fluid suspicious for fat necrosis. This is more coalescent than on prior exam. 4. Small volume of free fluid in the posterior pelvis but no loculated collection/abscess. No free air. Aortic Atherosclerosis (ICD10-I70.0). Electronically Signed   By: Keith Rake M.D.   On: 11/21/2020 19:32   CT ABDOMEN PELVIS WO CONTRAST  Addendum Date: 11/10/2020   ADDENDUM REPORT: 11/10/2020 16:01 ADDENDUM: Critical Value/emergent results were called by telephone at the time of interpretation on 11/10/2020 at 4:01 pm to provider [facility] , who verbally acknowledged these results. Electronically Signed   By: Marijo Conception M.D.   On: 11/10/2020 16:01   Result Date: 11/10/2020 CLINICAL DATA:  Abdominal distension, vomiting. Status post colon resection. EXAM: CT ABDOMEN AND PELVIS WITHOUT CONTRAST  TECHNIQUE: Multidetector CT imaging of the abdomen and pelvis was performed following the standard protocol without IV contrast. COMPARISON:  Oct 28, 2020. FINDINGS: Lower chest: Left basilar subsegmental atelectasis or infiltrate is noted. Hepatobiliary: There appears to be left intrahepatic portal venous gas. Status post cholecystectomy. No biliary dilatation. Pancreas: Unremarkable. No pancreatic ductal dilatation or surrounding inflammatory changes. Spleen: Normal in size without focal abnormality. Adrenals/Urinary Tract: Adrenal glands appear normal. Stable large right renal cyst is noted. No hydronephrosis or renal obstruction is noted. No renal or ureteral calculi are noted. Urinary bladder is unremarkable. Stomach/Bowel: Status post right colectomy. Moderate gastric distention is noted as well as severe small bowel dilatation concerning for distal small bowel obstruction. There appears to be intramural gas involving portions of the dilated small bowel concerning for possible ischemic bowel. Vascular/Lymphatic: Aortic atherosclerosis. No enlarged abdominal or pelvic lymph nodes. Reproductive: Status post hysterectomy. No adnexal masses. Other: No abdominal wall hernia or abnormality. No abdominopelvic ascites. Musculoskeletal: No acute or significant osseous findings. IMPRESSION: Severely dilated small bowel loops are noted as well as moderate gastric distension concerning for distal small bowel obstruction. There also appears to be intramural gas involving portions of the dilated small bowel concerning for ischemic bowel. Also noted is a small amount of left intrahepatic portal venous gas, also suggesting ischemic bowel. Left basilar subsegmental atelectasis or infiltrate is noted. Status post right colectomy. Aortic Atherosclerosis (ICD10-I70.0). Electronically Signed: By: Marijo Conception M.D. On: 11/10/2020 15:54   DG Abd 1 View  Result Date: 11/11/2020 CLINICAL DATA:  Check gastric catheter placement  EXAM: ABDOMEN - 1 VIEW COMPARISON:  11/10/2020 FINDINGS: Gastric catheter is been further advanced into the stomach and is coiled within the stomach. Right-sided PICC line is noted in satisfactory position. Cardiac shadow is stable. Stable airspace opacities in the bases similar to that noted on the prior exam. IMPRESSION: Gastric catheter coiled within the stomach. Electronically Signed   By: Inez Catalina M.D.   On: 11/11/2020 15:23   DG Abd 1 View  Result Date: 11/10/2020 CLINICAL DATA:  NG tube placement. EXAM: ABDOMEN - 1 VIEW COMPARISON:  Earlier same day FINDINGS: 2101 hours. NG tube tip is in the mid stomach. Gaseous distention of bowel noted in the visualized upper abdomen. IMPRESSION: NG tube tip is in the mid stomach. Electronically Signed   By: Misty Stanley M.D.   On: 11/10/2020 21:32   DG Abd 1 View  Result Date: 11/02/2020 CLINICAL DATA:  NG tube placement EXAM: ABDOMEN -  1 VIEW COMPARISON:  11/02/2020 FINDINGS: Esophageal tube tip and side port overlie the proximal stomach. Partially visualized dilated air distended bowel. IMPRESSION: Esophageal tube tip and side port overlie the proximal stomach Electronically Signed   By: Donavan Foil M.D.   On: 11/02/2020 20:20   DG CHEST PORT 1 VIEW  Result Date: 11/13/2020 CLINICAL DATA:  Increased respiratory rate EXAM: PORTABLE CHEST 1 VIEW COMPARISON:  Nov 04, 2020 FINDINGS: The cardiomediastinal silhouette is unchanged in contour.Removal of enteric tube. RIGHT upper extremity PICC tip terminates over the superior cavoatrial junction. Atherosclerotic calcifications. Biapical scarring. No pleural effusion. No pneumothorax. Bilateral hazy opacities, similar in comparison to prior. Visualized abdomen is unremarkable. Multilevel degenerative changes of the thoracic spine. Compression fracture of the inferior thoracic spine, unchanged. IMPRESSION: Similar appearance of hazy bilateral opacities. Differential considerations include infection, pulmonary  edema or aspiration Electronically Signed   By: Valentino Saxon MD   On: 11/13/2020 16:16   DG CHEST PORT 1 VIEW  Result Date: 11/12/2020 CLINICAL DATA:  86 year old female with increased work of breathing. EXAM: PORTABLE CHEST - 1 VIEW COMPARISON:  11/11/2020 FINDINGS: The mediastinal contours are within normal limits. No cardiomegaly. Slight interval increased conspicuity of hazy, streaky opacities in the right upper lobe. Similar appearing patchy, hazy opacities in the right lower lobe. Unchanged lingular opacities. No evidence of significant pleural effusion. No pneumothorax. Right upper extremity PICC terminates at the cavoatrial junction, unchanged. Enteric feeding tube courses off the inferior aspect of this image, however the tip is visualized in the gastric fundus. Similar-appearing atherosclerotic calcifications of the aortic arch. No acute osseous abnormality. IMPRESSION: Slight interval increased conspicuity of previously visualized right upper and lower lobe hazy pulmonary opacities. These findings could be seen with developing multifocal pneumonia, asymmetric pulmonary edema, or postobstructive atelectasis. Electronically Signed   By: Ruthann Cancer MD   On: 11/12/2020 14:12   DG CHEST PORT 1 VIEW  Result Date: 11/11/2020 CLINICAL DATA:  Status post PICC line placement EXAM: PORTABLE CHEST 1 VIEW COMPARISON:  10/26/2020 chest radiograph and 10/28/2020 CT FINDINGS: Right-sided PICC line terminates at the low SVC. Midline trachea. Mild cardiomegaly. Atherosclerosis in the transverse aorta. No pleural effusion or pneumothorax. Diffuse interstitial thickening. Suspect developing more confluent inferior right upper and bibasilar patchy airspace opacities. IMPRESSION: Right-sided PICC line tip at low SVC, without pneumothorax. Suspicion of developing pulmonary opacities, superimposed upon chronic interstitial thickening. Correlate with infectious symptoms to exclude early pneumonia. Aortic  Atherosclerosis (ICD10-I70.0). Electronically Signed   By: Abigail Miyamoto M.D.   On: 11/11/2020 10:55   DG Abd 2 Views  Result Date: 11/21/2020 CLINICAL DATA:  Small-bowel obstruction EXAM: ABDOMEN - 2 VIEW COMPARISON:  November 18, 2020 FINDINGS: Supine and upright images were obtained. There remain multiple loops of dilated small bowel with occasional air-fluid levels. No free air appreciable. Surgical clips in gallbladder fossa region. Lower lung regions are clear. IMPRESSION: Multiple loops of dilated small bowel with occasional air-fluid levels. Differential considerations include bowel obstruction and ileus. No evident free air. Visualized lower lung regions clear. Electronically Signed   By: Lowella Grip III M.D.   On: 11/21/2020 08:02   DG Abd 2 Views  Result Date: 11/10/2020 CLINICAL DATA:  Vomiting, abdominal distension EXAM: ABDOMEN - 2 VIEW COMPARISON:  None. FINDINGS: Two view radiograph the abdomen demonstrates multiple gas-filled dilated loops of small-bowel throughout the abdomen demonstrating air-fluid levels at multiple levels. There is a relative paucity of gas within the colon. Together the findings are in  keeping with a distal small bowel obstruction. No free intraperitoneal gas. Cholecystectomy clips seen in the right upper quadrant. IMPRESSION: Distal small bowel obstruction. Electronically Signed   By: Fidela Salisbury MD   On: 11/10/2020 01:15   DG Abd Portable 1V  Result Date: 11/18/2020 CLINICAL DATA:  Ileus following gastrointestinal surgery, continued abdominal pain and distension EXAM: PORTABLE ABDOMEN - 1 VIEW COMPARISON:  11/16/2020 FINDINGS: Persistent marked gaseous distention of small bowel loops favor postoperative ileus though small-bowel obstruction is not entirely excluded. Minimal colonic gas and stool. No bowel wall thickening. Bones demineralized with biconvex thoracolumbar scoliosis. IMPRESSION: Persistent gaseous distension of small bowel loops favor postoperative  ileus though obstruction not entirely excluded. Electronically Signed   By: Lavonia Dana M.D.   On: 11/18/2020 14:09   DG Abd Portable 1V  Result Date: 11/16/2020 CLINICAL DATA:  Cecal cancer with hemicolectomy 10/31/2020.  Nausea. EXAM: PORTABLE ABDOMEN - 1 VIEW COMPARISON:  11/11/2020 FINDINGS: NG tube removed. Mildly distended bowel loops predominately small bowel. Upright view not obtained evaluate for free air Surgical clips in the gallbladder fossa. Lumbar scoliosis and disc degeneration. Mild atherosclerotic aorta. IMPRESSION: Mildly distended small bowel loops may represent small-bowel obstruction or ileus. Electronically Signed   By: Franchot Gallo M.D.   On: 11/16/2020 15:16   DG Abd Portable 1V  Result Date: 11/02/2020 CLINICAL DATA:  Ileus. EXAM: PORTABLE ABDOMEN - 1 VIEW COMPARISON:  Oct 28, 2020. FINDINGS: Status post cholecystectomy. No colonic dilatation. Dilated small bowel loops are seen centrally and in the right abdomen concerning for distal small bowel obstruction or ileus. No abnormal calcifications are noted IMPRESSION: Dilated small bowel loops are noted concerning for distal small bowel obstruction or ileus. Electronically Signed   By: Marijo Conception M.D.   On: 11/02/2020 10:30   ECHOCARDIOGRAM COMPLETE  Result Date: 11/15/2020    ECHOCARDIOGRAM REPORT   Patient Name:   Robin [name] Date of Exam: 11/15/2020 Medical Rec #:  [id number]          Height:       64.0 in Accession #:    [id number]         Weight:       135.1 lb Date of Birth:  01/26/1935         BSA:          1.656 m Patient Age:    44 years           BP:           134/62 mmHg Patient Gender: F                  HR:           84 bpm. Exam Location:  Inpatient Procedure: 2D Echo Indications:    CHF-Acute Diastolic Q67.61  History:        Patient has prior history of Echocardiogram examinations, most                 recent 08/28/2018. Arrythmias:Atrial Fibrillation; Risk                 Factors:Hypertension and  Dyslipidemia. First Degree Heart Block,                 Colon cancer.  Sonographer:    Leavy Cella Referring Phys: [id number] RAVI PAHWANI IMPRESSIONS  1. Left ventricular ejection fraction, by estimation, is 60 to 65%. The left ventricle has normal function. The left ventricle has no regional  wall motion abnormalities. Left ventricular diastolic parameters are indeterminate.  2. Right ventricular systolic function is normal. The right ventricular size is normal. There is normal pulmonary artery systolic pressure. The estimated right ventricular systolic pressure is 69.4 mmHg.  3. Right atrial size was mildly dilated.  4. The mitral valve is normal in structure. Trivial mitral valve regurgitation.  5. The aortic valve was not well visualized. Aortic valve regurgitation is not visualized. No aortic stenosis is present.  6. The inferior vena cava is normal in size with greater than 50% respiratory variability, suggesting right atrial pressure of 3 mmHg. FINDINGS  Left Ventricle: Left ventricular ejection fraction, by estimation, is 60 to 65%. The left ventricle has normal function. The left ventricle has no regional wall motion abnormalities. The left ventricular internal cavity size was small. There is no left ventricular hypertrophy. Left ventricular diastolic parameters are indeterminate. Right Ventricle: The right ventricular size is normal. No increase in right ventricular wall thickness. Right ventricular systolic function is normal. There is normal pulmonary artery systolic pressure. The tricuspid regurgitant velocity is 2.55 m/s, and  with an assumed right atrial pressure of 3 mmHg, the estimated right ventricular systolic pressure is 85.4 mmHg. Left Atrium: Left atrial size was normal in size. Right Atrium: Right atrial size was mildly dilated. Pericardium: There is no evidence of pericardial effusion. Mitral Valve: The mitral valve is normal in structure. Trivial mitral valve regurgitation. Tricuspid Valve:  The tricuspid valve is normal in structure. Tricuspid valve regurgitation is trivial. Aortic Valve: The aortic valve was not well visualized. Aortic valve regurgitation is not visualized. No aortic stenosis is present. Pulmonic Valve: The pulmonic valve was not well visualized. Pulmonic valve regurgitation is not visualized. Aorta: The aortic root and ascending aorta are structurally normal, with no evidence of dilitation. Venous: The inferior vena cava is normal in size with greater than 50% respiratory variability, suggesting right atrial pressure of 3 mmHg. IAS/Shunts: The interatrial septum was not well visualized.  LEFT VENTRICLE PLAX 2D LVIDd:         3.53 cm  Diastology LVIDs:         2.19 cm  LV e' medial:    8.70 cm/s LV PW:         0.90 cm  LV E/e' medial:  13.1 LV IVS:        0.95 cm  LV e' lateral:   10.80 cm/s LVOT diam:     1.80 cm  LV E/e' lateral: 10.6 LVOT Area:     2.54 cm  RIGHT VENTRICLE RV S prime:     13.70 cm/s TAPSE (M-mode): 1.4 cm LEFT ATRIUM             Index       RIGHT ATRIUM           Index LA diam:        3.50 cm 2.11 cm/m  RA Area:     17.70 cm LA Vol (A2C):   46.1 ml 27.84 ml/m RA Volume:   46.20 ml  27.90 ml/m LA Vol (A4C):   29.1 ml 17.57 ml/m LA Biplane Vol: 37.9 ml 22.88 ml/m   AORTA Ao Root diam: 2.70 cm MITRAL VALVE                TRICUSPID VALVE MV Area (PHT): 3.53 cm     TR Peak grad:   26.0 mmHg MV Decel Time: 215 msec     TR Vmax:  255.00 cm/s MV E velocity: 114.00 cm/s MV A velocity: 43.70 cm/s   SHUNTS MV E/A ratio:  2.61         Systemic Diam: 1.80 cm Oswaldo Milian MD Electronically signed by Oswaldo Milian MD Signature Date/Time: 11/15/2020/2:37:09 PM    Final    [place] EKG SITE RITE  Result Date: 11/10/2020 If Site Rite image not attached, placement could not be confirmed due to current cardiac rhythm.   Microbiology: No results found for this or any previous visit (from the past 240 hour(s)).   Labs: Basic Metabolic Panel: Recent Labs   Lab 11/23/20 0459 11/24/20 0320 11/25/20 0421 11/26/20 0350 11/27/20 0324 11/28/20 0328  NA 139 136 139 139 137 138  K 3.8 3.7 3.7 3.5 2.9* 3.2*  CL 109 105 105 104 103 105  CO2 25 25 27 27 27 27   GLUCOSE 132* 126* 92 85 94 94  BUN 27* 21 25* 22 17 16   CREATININE 0.50 0.56 0.60 0.48 0.59 0.54  CALCIUM 8.8* 9.2 9.3 9.6 9.4 9.5  MG 2.3 2.2  --   --   --   --   PHOS 2.0* 2.9  --   --   --   --    Liver Function Tests: Recent Labs  Lab 11/24/20 0320  AST 20  ALT 21  ALKPHOS 175*  BILITOT 0.9  PROT 5.6*  ALBUMIN 2.6*   No results for input(s): LIPASE, AMYLASE in the last 168 hours. No results for input(s): AMMONIA in the last 168 hours. CBC: Recent Labs  Lab 11/23/20 0459 11/24/20 0320 11/25/20 0421  WBC 8.8 8.0 7.3  HGB 9.3* 9.4* 9.4*  HCT 31.2* 30.8* 31.4*  MCV 82.8 81.5 82.8  PLT 364 351 297   Cardiac Enzymes: No results for input(s): CKTOTAL, CKMB, CKMBINDEX, TROPONINI in the last 168 hours. BNP: BNP (last 3 results) Recent Labs    11/14/20 0855  BNP 143.9*    ProBNP (last 3 results) No results for input(s): PROBNP in the last 8760 hours.  CBG: Recent Labs  Lab 11/24/20 1145 11/24/20 1719 11/24/20 2107 11/25/20 0749 11/26/20 0750  GLUCAP 115* 121* 97 94 83       Signed:  Domenic Polite MD.  [facility] 11/29/2020, 10:52 AM

## 2020-11-30 DIAGNOSIS — C180 Malignant neoplasm of cecum: Secondary | ICD-10-CM | POA: Diagnosis not present

## 2020-11-30 DIAGNOSIS — M19072 Primary osteoarthritis, left ankle and foot: Secondary | ICD-10-CM | POA: Diagnosis not present

## 2020-11-30 DIAGNOSIS — E876 Hypokalemia: Secondary | ICD-10-CM | POA: Diagnosis not present

## 2020-11-30 DIAGNOSIS — R197 Diarrhea, unspecified: Secondary | ICD-10-CM | POA: Diagnosis not present

## 2020-11-30 DIAGNOSIS — R002 Palpitations: Secondary | ICD-10-CM | POA: Diagnosis not present

## 2020-11-30 DIAGNOSIS — I10 Essential (primary) hypertension: Secondary | ICD-10-CM | POA: Diagnosis not present

## 2020-11-30 DIAGNOSIS — R000 Tachycardia, unspecified: Secondary | ICD-10-CM | POA: Diagnosis not present

## 2020-11-30 DIAGNOSIS — M25472 Effusion, left ankle: Secondary | ICD-10-CM | POA: Diagnosis not present

## 2020-11-30 DIAGNOSIS — K625 Hemorrhage of anus and rectum: Secondary | ICD-10-CM | POA: Diagnosis not present

## 2020-11-30 DIAGNOSIS — E559 Vitamin D deficiency, unspecified: Secondary | ICD-10-CM | POA: Diagnosis not present

## 2020-11-30 DIAGNOSIS — C189 Malignant neoplasm of colon, unspecified: Secondary | ICD-10-CM | POA: Diagnosis not present

## 2020-11-30 DIAGNOSIS — E785 Hyperlipidemia, unspecified: Secondary | ICD-10-CM | POA: Diagnosis not present

## 2020-11-30 DIAGNOSIS — K649 Unspecified hemorrhoids: Secondary | ICD-10-CM | POA: Diagnosis not present

## 2020-11-30 DIAGNOSIS — R7989 Other specified abnormal findings of blood chemistry: Secondary | ICD-10-CM | POA: Diagnosis not present

## 2020-11-30 DIAGNOSIS — I4819 Other persistent atrial fibrillation: Secondary | ICD-10-CM | POA: Diagnosis not present

## 2020-11-30 DIAGNOSIS — R911 Solitary pulmonary nodule: Secondary | ICD-10-CM | POA: Diagnosis not present

## 2020-11-30 DIAGNOSIS — R051 Acute cough: Secondary | ICD-10-CM | POA: Diagnosis not present

## 2020-11-30 DIAGNOSIS — R918 Other nonspecific abnormal finding of lung field: Secondary | ICD-10-CM | POA: Diagnosis not present

## 2020-11-30 DIAGNOSIS — E560 Deficiency of vitamin E: Secondary | ICD-10-CM | POA: Diagnosis not present

## 2020-11-30 DIAGNOSIS — D649 Anemia, unspecified: Secondary | ICD-10-CM | POA: Diagnosis not present

## 2020-11-30 DIAGNOSIS — K580 Irritable bowel syndrome with diarrhea: Secondary | ICD-10-CM | POA: Diagnosis not present

## 2020-11-30 DIAGNOSIS — M25572 Pain in left ankle and joints of left foot: Secondary | ICD-10-CM | POA: Diagnosis not present

## 2020-11-30 DIAGNOSIS — E2839 Other primary ovarian failure: Secondary | ICD-10-CM | POA: Diagnosis not present

## 2020-11-30 DIAGNOSIS — H00015 Hordeolum externum left lower eyelid: Secondary | ICD-10-CM | POA: Diagnosis not present

## 2020-11-30 DIAGNOSIS — L8990 Pressure ulcer of unspecified site, unspecified stage: Secondary | ICD-10-CM | POA: Diagnosis not present

## 2020-11-30 DIAGNOSIS — I4811 Longstanding persistent atrial fibrillation: Secondary | ICD-10-CM | POA: Diagnosis not present

## 2020-11-30 DIAGNOSIS — I480 Paroxysmal atrial fibrillation: Secondary | ICD-10-CM | POA: Diagnosis not present

## 2020-11-30 DIAGNOSIS — R0602 Shortness of breath: Secondary | ICD-10-CM | POA: Diagnosis not present

## 2020-11-30 DIAGNOSIS — N200 Calculus of kidney: Secondary | ICD-10-CM | POA: Diagnosis not present

## 2020-11-30 NOTE — Clinical Social Work Note (Signed)
CSW was notified by Bernadene Bell that patient's denial was overturned. Patient has been approved for SNF with a start date of 11/30/20 and the next review date is 12/04/20. Authorization # is: [id number]. Clinicals for continued stay will be faxed to [facility]. CSW updated [name] with [name]. CSW left VM for patient's daughter.

## 2020-12-01 DIAGNOSIS — E876 Hypokalemia: Secondary | ICD-10-CM | POA: Diagnosis not present

## 2020-12-01 DIAGNOSIS — R197 Diarrhea, unspecified: Secondary | ICD-10-CM | POA: Diagnosis not present

## 2020-12-01 DIAGNOSIS — C180 Malignant neoplasm of cecum: Secondary | ICD-10-CM | POA: Diagnosis not present

## 2020-12-01 DIAGNOSIS — I480 Paroxysmal atrial fibrillation: Secondary | ICD-10-CM | POA: Diagnosis not present

## 2020-12-01 DIAGNOSIS — K580 Irritable bowel syndrome with diarrhea: Secondary | ICD-10-CM | POA: Diagnosis not present

## 2020-12-02 DIAGNOSIS — C180 Malignant neoplasm of cecum: Secondary | ICD-10-CM | POA: Diagnosis not present

## 2020-12-02 DIAGNOSIS — M25572 Pain in left ankle and joints of left foot: Secondary | ICD-10-CM | POA: Diagnosis not present

## 2020-12-02 DIAGNOSIS — M25472 Effusion, left ankle: Secondary | ICD-10-CM | POA: Diagnosis not present

## 2020-12-02 DIAGNOSIS — K580 Irritable bowel syndrome with diarrhea: Secondary | ICD-10-CM | POA: Diagnosis not present

## 2020-12-02 DIAGNOSIS — R197 Diarrhea, unspecified: Secondary | ICD-10-CM | POA: Diagnosis not present

## 2020-12-05 DIAGNOSIS — R002 Palpitations: Secondary | ICD-10-CM | POA: Diagnosis not present

## 2020-12-05 DIAGNOSIS — M25572 Pain in left ankle and joints of left foot: Secondary | ICD-10-CM | POA: Diagnosis not present

## 2020-12-05 DIAGNOSIS — I4819 Other persistent atrial fibrillation: Secondary | ICD-10-CM | POA: Diagnosis not present

## 2020-12-05 DIAGNOSIS — M25472 Effusion, left ankle: Secondary | ICD-10-CM | POA: Diagnosis not present

## 2020-12-06 DIAGNOSIS — I480 Paroxysmal atrial fibrillation: Secondary | ICD-10-CM | POA: Diagnosis not present

## 2020-12-06 DIAGNOSIS — I10 Essential (primary) hypertension: Secondary | ICD-10-CM | POA: Diagnosis not present

## 2020-12-06 DIAGNOSIS — C189 Malignant neoplasm of colon, unspecified: Secondary | ICD-10-CM | POA: Diagnosis not present

## 2020-12-06 DIAGNOSIS — K625 Hemorrhage of anus and rectum: Secondary | ICD-10-CM | POA: Diagnosis not present

## 2020-12-07 DIAGNOSIS — K649 Unspecified hemorrhoids: Secondary | ICD-10-CM | POA: Diagnosis not present

## 2020-12-07 DIAGNOSIS — C189 Malignant neoplasm of colon, unspecified: Secondary | ICD-10-CM | POA: Diagnosis not present

## 2020-12-07 DIAGNOSIS — K625 Hemorrhage of anus and rectum: Secondary | ICD-10-CM | POA: Diagnosis not present

## 2020-12-07 DIAGNOSIS — H00015 Hordeolum externum left lower eyelid: Secondary | ICD-10-CM | POA: Diagnosis not present

## 2020-12-09 DIAGNOSIS — R051 Acute cough: Secondary | ICD-10-CM | POA: Diagnosis not present

## 2020-12-09 DIAGNOSIS — I4811 Longstanding persistent atrial fibrillation: Secondary | ICD-10-CM | POA: Diagnosis not present

## 2020-12-09 DIAGNOSIS — R000 Tachycardia, unspecified: Secondary | ICD-10-CM | POA: Diagnosis not present

## 2020-12-09 DIAGNOSIS — K649 Unspecified hemorrhoids: Secondary | ICD-10-CM | POA: Diagnosis not present

## 2020-12-09 DIAGNOSIS — R0602 Shortness of breath: Secondary | ICD-10-CM | POA: Diagnosis not present

## 2020-12-09 DIAGNOSIS — C189 Malignant neoplasm of colon, unspecified: Secondary | ICD-10-CM | POA: Diagnosis not present

## 2020-12-12 DIAGNOSIS — R0602 Shortness of breath: Secondary | ICD-10-CM | POA: Diagnosis not present

## 2020-12-12 DIAGNOSIS — R051 Acute cough: Secondary | ICD-10-CM | POA: Diagnosis not present

## 2020-12-12 DIAGNOSIS — J189 Pneumonia, unspecified organism: Secondary | ICD-10-CM | POA: Diagnosis not present

## 2020-12-14 DIAGNOSIS — Z7901 Long term (current) use of anticoagulants: Secondary | ICD-10-CM | POA: Diagnosis not present

## 2020-12-14 DIAGNOSIS — I4811 Longstanding persistent atrial fibrillation: Secondary | ICD-10-CM | POA: Diagnosis not present

## 2020-12-14 DIAGNOSIS — R051 Acute cough: Secondary | ICD-10-CM | POA: Diagnosis not present

## 2020-12-14 DIAGNOSIS — J189 Pneumonia, unspecified organism: Secondary | ICD-10-CM | POA: Diagnosis not present

## 2020-12-14 DIAGNOSIS — K649 Unspecified hemorrhoids: Secondary | ICD-10-CM | POA: Diagnosis not present

## 2020-12-16 DIAGNOSIS — K649 Unspecified hemorrhoids: Secondary | ICD-10-CM | POA: Diagnosis not present

## 2020-12-16 DIAGNOSIS — C189 Malignant neoplasm of colon, unspecified: Secondary | ICD-10-CM | POA: Diagnosis not present

## 2020-12-16 DIAGNOSIS — J189 Pneumonia, unspecified organism: Secondary | ICD-10-CM | POA: Diagnosis not present

## 2020-12-16 DIAGNOSIS — K580 Irritable bowel syndrome with diarrhea: Secondary | ICD-10-CM | POA: Diagnosis not present

## 2020-12-16 DIAGNOSIS — I4811 Longstanding persistent atrial fibrillation: Secondary | ICD-10-CM | POA: Diagnosis not present

## 2020-12-17 DIAGNOSIS — E46 Unspecified protein-calorie malnutrition: Secondary | ICD-10-CM | POA: Diagnosis not present

## 2020-12-17 DIAGNOSIS — C180 Malignant neoplasm of cecum: Secondary | ICD-10-CM | POA: Diagnosis not present

## 2020-12-17 DIAGNOSIS — E669 Obesity, unspecified: Secondary | ICD-10-CM | POA: Diagnosis not present

## 2020-12-17 DIAGNOSIS — I739 Peripheral vascular disease, unspecified: Secondary | ICD-10-CM | POA: Diagnosis not present

## 2020-12-17 DIAGNOSIS — D630 Anemia in neoplastic disease: Secondary | ICD-10-CM | POA: Diagnosis not present

## 2020-12-17 DIAGNOSIS — K580 Irritable bowel syndrome with diarrhea: Secondary | ICD-10-CM | POA: Diagnosis not present

## 2020-12-17 DIAGNOSIS — I4891 Unspecified atrial fibrillation: Secondary | ICD-10-CM | POA: Diagnosis not present

## 2020-12-17 DIAGNOSIS — I110 Hypertensive heart disease with heart failure: Secondary | ICD-10-CM | POA: Diagnosis not present

## 2020-12-17 DIAGNOSIS — I509 Heart failure, unspecified: Secondary | ICD-10-CM | POA: Diagnosis not present

## 2020-12-19 DIAGNOSIS — E669 Obesity, unspecified: Secondary | ICD-10-CM | POA: Diagnosis not present

## 2020-12-19 DIAGNOSIS — E46 Unspecified protein-calorie malnutrition: Secondary | ICD-10-CM | POA: Diagnosis not present

## 2020-12-19 DIAGNOSIS — K580 Irritable bowel syndrome with diarrhea: Secondary | ICD-10-CM | POA: Diagnosis not present

## 2020-12-19 DIAGNOSIS — I110 Hypertensive heart disease with heart failure: Secondary | ICD-10-CM | POA: Diagnosis not present

## 2020-12-19 DIAGNOSIS — I739 Peripheral vascular disease, unspecified: Secondary | ICD-10-CM | POA: Diagnosis not present

## 2020-12-19 DIAGNOSIS — I509 Heart failure, unspecified: Secondary | ICD-10-CM | POA: Diagnosis not present

## 2020-12-19 DIAGNOSIS — C180 Malignant neoplasm of cecum: Secondary | ICD-10-CM | POA: Diagnosis not present

## 2020-12-19 DIAGNOSIS — D630 Anemia in neoplastic disease: Secondary | ICD-10-CM | POA: Diagnosis not present

## 2020-12-19 DIAGNOSIS — I4891 Unspecified atrial fibrillation: Secondary | ICD-10-CM | POA: Diagnosis not present

## 2020-12-21 DIAGNOSIS — I110 Hypertensive heart disease with heart failure: Secondary | ICD-10-CM | POA: Diagnosis not present

## 2020-12-21 DIAGNOSIS — I4891 Unspecified atrial fibrillation: Secondary | ICD-10-CM | POA: Diagnosis not present

## 2020-12-21 DIAGNOSIS — K580 Irritable bowel syndrome with diarrhea: Secondary | ICD-10-CM | POA: Diagnosis not present

## 2020-12-21 DIAGNOSIS — E46 Unspecified protein-calorie malnutrition: Secondary | ICD-10-CM | POA: Diagnosis not present

## 2020-12-21 DIAGNOSIS — C180 Malignant neoplasm of cecum: Secondary | ICD-10-CM | POA: Diagnosis not present

## 2020-12-21 DIAGNOSIS — E669 Obesity, unspecified: Secondary | ICD-10-CM | POA: Diagnosis not present

## 2020-12-21 DIAGNOSIS — D630 Anemia in neoplastic disease: Secondary | ICD-10-CM | POA: Diagnosis not present

## 2020-12-21 DIAGNOSIS — I739 Peripheral vascular disease, unspecified: Secondary | ICD-10-CM | POA: Diagnosis not present

## 2020-12-21 DIAGNOSIS — I509 Heart failure, unspecified: Secondary | ICD-10-CM | POA: Diagnosis not present

## 2020-12-27 DIAGNOSIS — I4891 Unspecified atrial fibrillation: Secondary | ICD-10-CM | POA: Diagnosis not present

## 2020-12-27 DIAGNOSIS — E669 Obesity, unspecified: Secondary | ICD-10-CM | POA: Diagnosis not present

## 2020-12-27 DIAGNOSIS — I739 Peripheral vascular disease, unspecified: Secondary | ICD-10-CM | POA: Diagnosis not present

## 2020-12-27 DIAGNOSIS — I509 Heart failure, unspecified: Secondary | ICD-10-CM | POA: Diagnosis not present

## 2020-12-27 DIAGNOSIS — D630 Anemia in neoplastic disease: Secondary | ICD-10-CM | POA: Diagnosis not present

## 2020-12-27 DIAGNOSIS — K580 Irritable bowel syndrome with diarrhea: Secondary | ICD-10-CM | POA: Diagnosis not present

## 2020-12-27 DIAGNOSIS — I110 Hypertensive heart disease with heart failure: Secondary | ICD-10-CM | POA: Diagnosis not present

## 2020-12-27 DIAGNOSIS — C180 Malignant neoplasm of cecum: Secondary | ICD-10-CM | POA: Diagnosis not present

## 2020-12-27 DIAGNOSIS — E46 Unspecified protein-calorie malnutrition: Secondary | ICD-10-CM | POA: Diagnosis not present

## 2020-12-28 DIAGNOSIS — C180 Malignant neoplasm of cecum: Secondary | ICD-10-CM | POA: Diagnosis not present

## 2020-12-28 DIAGNOSIS — I4891 Unspecified atrial fibrillation: Secondary | ICD-10-CM | POA: Diagnosis not present

## 2020-12-28 DIAGNOSIS — I509 Heart failure, unspecified: Secondary | ICD-10-CM | POA: Diagnosis not present

## 2020-12-28 DIAGNOSIS — I739 Peripheral vascular disease, unspecified: Secondary | ICD-10-CM | POA: Diagnosis not present

## 2020-12-28 DIAGNOSIS — I110 Hypertensive heart disease with heart failure: Secondary | ICD-10-CM | POA: Diagnosis not present

## 2020-12-28 DIAGNOSIS — D630 Anemia in neoplastic disease: Secondary | ICD-10-CM | POA: Diagnosis not present

## 2020-12-28 DIAGNOSIS — K580 Irritable bowel syndrome with diarrhea: Secondary | ICD-10-CM | POA: Diagnosis not present

## 2020-12-28 DIAGNOSIS — E669 Obesity, unspecified: Secondary | ICD-10-CM | POA: Diagnosis not present

## 2020-12-28 DIAGNOSIS — E46 Unspecified protein-calorie malnutrition: Secondary | ICD-10-CM | POA: Diagnosis not present

## 2020-12-30 ENCOUNTER — Encounter (HOSPITAL_BASED_OUTPATIENT_CLINIC_OR_DEPARTMENT_OTHER): Payer: Self-pay | Admitting: Cardiovascular Disease

## 2020-12-30 ENCOUNTER — Ambulatory Visit (HOSPITAL_BASED_OUTPATIENT_CLINIC_OR_DEPARTMENT_OTHER): Payer: Medicare PPO | Admitting: Cardiovascular Disease

## 2020-12-30 ENCOUNTER — Other Ambulatory Visit: Payer: Self-pay

## 2020-12-30 DIAGNOSIS — I119 Hypertensive heart disease without heart failure: Secondary | ICD-10-CM | POA: Diagnosis not present

## 2020-12-30 DIAGNOSIS — R600 Localized edema: Secondary | ICD-10-CM | POA: Insufficient documentation

## 2020-12-30 DIAGNOSIS — I4819 Other persistent atrial fibrillation: Secondary | ICD-10-CM

## 2020-12-30 DIAGNOSIS — E7800 Pure hypercholesterolemia, unspecified: Secondary | ICD-10-CM

## 2020-12-30 HISTORY — DX: Other persistent atrial fibrillation: I48.19

## 2020-12-30 HISTORY — DX: Localized edema: R60.0

## 2020-12-30 NOTE — Assessment & Plan Note (Signed)
Lipids well-controlled on atorvastatin. 

## 2020-12-30 NOTE — Patient Instructions (Signed)
Medication Instructions:  Your physician recommends that you continue on your current medications as directed. Please refer to the Current Medication list given to you today.   *If you need a refill on your cardiac medications before your next appointment, please call your pharmacy*  Lab Work: NONE   Testing/Procedures: NONE   Follow-Up: At [facility], you and your health needs are our priority.  As part of our continuing mission to provide you with exceptional heart care, we have created designated Provider Care Teams.  These Care Teams include your primary Cardiologist (physician) and Advanced Practice Providers (APPs -  Physician Assistants and Nurse Practitioners) who all work together to provide you with the care you need, when you need it.  We recommend signing up for the patient portal called "MyChart".  Sign up information is provided on this After Visit Summary.  MyChart is used to connect with patients for Virtual Visits (Telemedicine).  Patients are able to view lab/test results, encounter notes, upcoming appointments, etc.  Non-urgent messages can be sent to your provider as well.   To learn more about what you can do with MyChart, go to [email or web address].    Your next appointment:   12 month(s)  The format for your next appointment:   In Person  Provider:   Skeet Latch, MD   Other Instructions  Blood pressure 128/64, pulse 74, height 5' 4.5" (1.638 m), weight 130 lb 12.8 oz (59.3 kg), SpO2 99 %.

## 2020-12-30 NOTE — Progress Notes (Signed)
Cardiology Office Note  Date:  12/30/2020   ID:  AMELY VOORHEIS, DOB May 26, 1935, MRN [id number]  PCP:  [name], PA-C  Cardiologist:   Skeet Latch, MD   No chief complaint on file.    History of Present Illness: Robin [name] is a 86 y.o. female with hypertension, paroxysmal atrial fibrillation, hyperlipidemia and mild carotid stenosis who presents for follow-up.  Robin [name] is a former patient of Dr. Mare Ferrari. She had a heart cath in 2009 that was normal.  Carotid Doppler 08/2013 revealed <50% stenosis bilaterally. She reported recurrent palpitations and wore a 30 day event monitor 10/2015 that showed occasional short episodes of atrial fibrillation.  She has been managed on Eliquis, diltiazem and metoprolol.  She is not interested in statins.  Robin [name] reported episodes of near syncope.  She noted that her heart was irregular but in clinic she was found to have a wandering pacemaker.  She was referred for a Lexiscan Myoview 02/2018 which revealed LVEF 81% and no ischemia.  Since that time she took a trip to the [place] and had a recurrent episode.  She discovered that she was actually having vertigo.  She has intermittently struggled with chest pain that improved with belching. She previously had ankle edema that improved after a vein and vascular procedure. She had colon cancer and a laparoscopic hemicolectomy 10/2020. She followed up with her surgical team earlier this month and was doing well.  Today, she is accompanied by her daughter, who also provides some history. Overall, she is feeling stronger. However, she normally feels worse in the morning and is "shaky" or tremulous. She is also having difficulty with edema in her ankles, and was recently told she may have gout. At night she lies down with a pillow to prop her feet up, and her edema improves by the morning. She was also prescribed Levaquin and states her breathing is good. Of note, her appetite has been  decreased for the past month. She is gradually regaining her appetite and her weight. At home, her blood pressure is typically low, around the 80s. At night, her heart rate averages in the 100s-110s, and she has been told not to take metroprolol if her heart rate is lower than 110. For exercise, she is walking around her house with purpose. The hot weather has prevented her from walking outdoors. Currently she is taking 180 mg diltiazem. She is hesitant to change medications if not absolutely necessary. Also, she is taking a hair, skin, and nails supplement with zinc and vitamin A, as well as a 10k mg Vitamin A supplement. She denies any chest pain, shortness of breath, palpitations, or exertional symptoms. No headaches, lightheadedness, or syncope to report. Also has no orthopnea or PND.  Past Medical History:  Diagnosis Date   Anxiety    AV bloc first degree    Carotid stenosis 03/02/2017   Hyperlipidemia    Hypertension    Lower extremity edema 12/30/2020   Lumbar pain    fell off of her horse and has low back pain at times   Persistent atrial fibrillation ([place]) 12/30/2020   Venous insufficiency of both lower extremities    Vertigo     Past Surgical History:  Procedure Laterality Date   ABDOMINAL HYSTERECTOMY     BIOPSY  10/28/2020   Procedure: BIOPSY;  Surgeon: Milus Banister, MD;  Location: [facility];  Service: Endoscopy;;   BREAST BIOPSY Left    BREAST EXCISIONAL BIOPSY Right  BREAST LUMPECTOMY WITH RADIOACTIVE SEED LOCALIZATION Left 05/05/2015   Procedure: LEFT BREAST LUMPECTOMY WITH RADIOACTIVE SEED LOCALIZATION;  Surgeon: Autumn Messing III, MD;  Location: [facility];  Service: General;  Laterality: Left;   CHOLECYSTECTOMY     COLON RESECTION N/A 10/31/2020   Procedure: LAPAROSCOPIC RIGHT COLON RESECTION, lysis of adhesions, hemicolectomy, bilateral tap block;  Surgeon: [name], MD;  Location: [facility];  Service: General;  Laterality: N/A;   COLONOSCOPY WITH  PROPOFOL N/A 10/28/2020   Procedure: COLONOSCOPY WITH PROPOFOL;  Surgeon: Milus Banister, MD;  Location: [facility];  Service: Endoscopy;  Laterality: N/A;   EYE SURGERY     bil cataract surgery   LUNG SURGERY     POLYPECTOMY  10/28/2020   Procedure: POLYPECTOMY;  Surgeon: Milus Banister, MD;  Location: [facility];  Service: Endoscopy;;   SUBMUCOSAL TATTOO INJECTION  10/28/2020   Procedure: SUBMUCOSAL TATTOO INJECTION;  Surgeon: Milus Banister, MD;  Location: [facility];  Service: Endoscopy;;     Current Outpatient Medications  Medication Sig Dispense Refill   acetaminophen (TYLENOL) 325 MG tablet Take 2 tablets (650 mg total) by mouth every 6 (six) hours as needed for mild pain, fever or headache.     atorvastatin (LIPITOR) 10 MG tablet Take 0.5 tablets by mouth 3 (three) times a week.     Cholecalciferol (VITAMIN D) 50 MCG (2000 UT) tablet Take 2,000 Units by mouth at bedtime.     diphenoxylate-atropine (LOMOTIL) 2.5-0.025 MG tablet Take 1 tablet by mouth every 12 (twelve) hours as needed for diarrhea or loose stools (for diarrhea). 30 tablet 10   diphenoxylate-atropine (LOMOTIL) 2.5-0.025 MG tablet Take 1-2 tablets by mouth every 12 (twelve) hours as needed for diarrhea or loose stools. 30 tablet 0   ELIQUIS 5 MG TABS tablet TAKE 1 TABLET BY MOUTH TWICE DAILY. 180 tablet 1   estradiol (ESTRACE) 1 MG tablet Take 0.5 mg by mouth daily.     ferrous sulfate 325 (65 FE) MG tablet Take 1 tablet (325 mg total) by mouth 2 (two) times daily with a meal. 60 tablet 0   loperamide (IMODIUM) 2 MG capsule Take 1 capsule (2 mg total) by mouth as needed for diarrhea or loose stools. 30 capsule 0   Lutein 20 MG CAPS Take 1 capsule by mouth every evening.     metoprolol succinate (TOPROL-XL) 50 MG 24 hr tablet Take 0.5-2 tablets (25-100 mg total) by mouth 2 (two) times daily. Take 100mg  in am and 25mg  in evening 60 tablet 0   ondansetron (ZOFRAN ODT) 4 MG disintegrating tablet Take 1 tablet (4 mg  total) by mouth every 8 (eight) hours as needed for nausea or vomiting. 20 tablet 0   polycarbophil (FIBERCON) 625 MG tablet Take 1 tablet (625 mg total) by mouth 2 (two) times daily. 60 tablet 10   potassium chloride SA (KLOR-CON) 20 MEQ tablet Take 1 tablet (20 mEq total) by mouth 2 (two) times daily.     TIADYLT ER 180 MG 24 hr capsule TAKE (1) CAPSULE DAILY. (Patient taking differently: Take 180 mg by mouth daily.) 90 capsule 2   tiZANidine (ZANAFLEX) 2 MG tablet Take 2 mg by mouth every 8 (eight) hours as needed for muscle spasms.     triamcinolone cream (KENALOG) 0.1 % Apply 1 application topically daily as needed (for rash).      triamterene-hydrochlorothiazide (MAXZIDE-25) 37.5-25 MG tablet Take 1 tablet by mouth daily.     vitamin A 10000 UNIT  capsule Take 10,000 Units by mouth daily.     No current facility-administered medications for this visit.    Allergies:   Iodinated diagnostic agents, Amlodipine, Codeine, Diphenhydramine, Diphenhydramine hcl, Pravastatin, Zetia [ezetimibe], Sulfa antibiotics, and Sulfa drugs cross reactors    Social History:  The patient  reports that she has never smoked. She has never used smokeless tobacco. She reports current alcohol use. She reports that she does not use drugs.   Family History:  The patient's family history includes Cancer in her sister; Heart disease in her father and mother; Hypertension in her brother, mother, sister, sister, and sister; Stroke in her mother.    ROS:   Please see the history of present illness. (+) Bilateral LE edema, ankles (+) Tremulous, feels "shaky" (+) Loss of appetite All other systems are reviewed and negative.     PHYSICAL EXAM: VS:  BP 128/64 (BP Location: Right Arm, Patient Position: Sitting)   Pulse 74   Ht 5' 4.5" (1.638 m)   Wt 130 lb 12.8 oz (59.3 kg)   SpO2 99%   BMI 22.11 kg/m  , BMI Body mass index is 22.11 kg/m. GENERAL:  Well appearing HEENT: Pupils equal round and reactive, fundi  not visualized, oral mucosa unremarkable NECK:  No jugular venous distention, waveform within normal limits, carotid upstroke brisk and symmetric, no bruits LUNGS:  Clear to auscultation bilaterally HEART:  Irregularly irregular.  PMI not displaced or sustained,S1 and S2 within normal limits, no S3, no S4, no clicks, no rubs, no murmurs ABD:  Flat, positive bowel sounds normal in frequency in pitch, no bruits, no rebound, no guarding, no midline pulsatile mass, no hepatomegaly, no splenomegaly EXT:  2 plus pulses throughout,1+ R LE edema, no cyanosis no clubbing SKIN:  No rashes no nodules NEURO:  Cranial nerves II through XII grossly intact, motor grossly intact throughout PSYCH:  Cognitively intact, oriented to person place and time   EKG:   11/27/16: sinus rhythm rate 68 bpm.  First degree AV block.  02/21/18: Wandering pacemaker.  Rate 67 bpm.  First degree AV block 08/15/18: Atrial fibrillation.  Rate 65 bpm.   03/09/20: Atrial fibrillation.  Rate 81 bpm.   12/30/2020: Atrial fibrillation. Rate 74 bpm. Nonspecific T wave abnormalities.  Lexiscan Myoview 02/2018:   Nuclear stress EF: 81%. The left ventricular ejection fraction is hyperdynamic (>65%). The study is normal. This is a low risk study.   Low risk stress nuclear study with normal perfusion and normal left ventricular regional and global systolic function.    30 day Event Monitor 11/08/15:   Quality: Fair.  Baseline artifact. Predominant rhythm: sinus rhythm at atrial fibrillation both noted Sinus rhythm: Average heart rate: 73 bpm Max heart rate: 123 bpm Min heart rate: 46 bpm  Atrial fibrillation: Rates 58-122 bpm.   <1% atrial fibrillation burden.  Carotid Dopplers 11/19/17: 1-39% ICA stenosis bilaterally.  Recent Labs: 11/14/2020: B Natriuretic Peptide 143.9 11/24/2020: ALT 21; Magnesium 2.2 11/25/2020: Hemoglobin 9.4; Platelets 297 11/28/2020: BUN 16; Creatinine, Ser 0.54; Potassium 3.2; Sodium 138    07/30/16: Sodium 139, potassium 3.6, BUN 13, creatinine 0.69 AST 14, ALT 15 Total cholesterol 198, triglycerides 131, HDL 63, LDL 109  11/01/17:  Total cholesterol 181, triglycerides 80, HDL 64, LDL 101 Creatinine 0.63 TSH 2.24   Lipid Panel    Component Value Date/Time   CHOL 213 (H) 06/14/2011 0832   TRIG 107 11/21/2020 0314   HDL 32.60 (L) 06/14/2011 0832   CHOLHDL 7 06/14/2011 7654  VLDL 17.0 06/14/2011 0832   LDLDIRECT 161.5 06/14/2011 0832      Wt Readings from Last 3 Encounters:  12/30/20 130 lb 12.8 oz (59.3 kg)  11/28/20 130 lb 15.3 oz (59.4 kg)  03/09/20 146 lb 3.2 oz (66.3 kg)      ASSESSMENT AND PLAN: Benign hypertensive heart disease without heart failure Blood pressure is initially elevated but better on repeat.  Continue metoprolol, diltiazem, and HCTZ triamterene.  She has hypokalemia and takes potassium supplements.  She reports that it was within normal limits prior to leaving her assisted living facility.  We did discuss switching to spironolactone but she would like to stay on her current regimen.  Hypercholesterolemia Lipids well-controlled on atorvastatin.  Persistent atrial fibrillation ([place]) Rates well have been controlled on metoprolol and diltiazem.  Continue Eliquis.  Lower extremity edema This has been a chronic issue.  It has been a little worse lately.  We discussed increasing her protein and calorie intake.  She is also had venous insufficiency.  Symptoms improved with elevation and she had a normal echo when last checked 2 months ago.  I do not think this is attributable to heart failure.  Given that she is on Eliquis chronically, she does not have a DVT.   Current medicines are reviewed at length with the patient today.  The patient does not have concerns regarding medicines.  The following changes have been made:  no change  Labs/ tests ordered today include:   No orders of the defined types were placed in this  encounter.     Disposition:   FU with Jadie Comas C. Oval Linsey, MD, [facility] in 1 year.  I,Mathew Stumpf,acting as a Education administrator for Skeet Latch, MD.,have documented all relevant documentation on the behalf of Skeet Latch, MD,as directed by  Skeet Latch, MD while in the presence of Skeet Latch, MD.  I, [place] Oval Linsey, MD have reviewed all documentation for this visit.  The documentation of the exam, diagnosis, procedures, and orders on 12/30/2020 are all accurate and complete.   Signed, Damarian Priola C. Oval Linsey, MD, [facility]  12/30/2020 5:57 PM    [place]

## 2020-12-30 NOTE — Assessment & Plan Note (Signed)
Blood pressure is initially elevated but better on repeat.  Continue metoprolol, diltiazem, and HCTZ triamterene.  She has hypokalemia and takes potassium supplements.  She reports that it was within normal limits prior to leaving her assisted living facility.  We did discuss switching to spironolactone but she would like to stay on her current regimen.

## 2020-12-30 NOTE — Assessment & Plan Note (Addendum)
This has been a chronic issue.  It has been a little worse lately.  We discussed increasing her protein and calorie intake.  She is also had venous insufficiency.  Symptoms improved with elevation and she had a normal echo when last checked 2 months ago.  I do not think this is attributable to heart failure.  Given that she is on Eliquis chronically, she does not have a DVT.

## 2020-12-30 NOTE — Assessment & Plan Note (Signed)
Rates well have been controlled on metoprolol and diltiazem.  Continue Eliquis.

## 2021-01-06 DIAGNOSIS — I10 Essential (primary) hypertension: Secondary | ICD-10-CM | POA: Diagnosis not present

## 2021-01-06 DIAGNOSIS — F430 Acute stress reaction: Secondary | ICD-10-CM | POA: Diagnosis not present

## 2021-01-06 DIAGNOSIS — I700 Atherosclerosis of aorta: Secondary | ICD-10-CM | POA: Diagnosis not present

## 2021-01-06 DIAGNOSIS — I482 Chronic atrial fibrillation, unspecified: Secondary | ICD-10-CM | POA: Diagnosis not present

## 2021-01-06 DIAGNOSIS — I4891 Unspecified atrial fibrillation: Secondary | ICD-10-CM | POA: Diagnosis not present

## 2021-01-06 DIAGNOSIS — I509 Heart failure, unspecified: Secondary | ICD-10-CM | POA: Diagnosis not present

## 2021-01-06 DIAGNOSIS — E785 Hyperlipidemia, unspecified: Secondary | ICD-10-CM | POA: Diagnosis not present

## 2021-01-06 DIAGNOSIS — I739 Peripheral vascular disease, unspecified: Secondary | ICD-10-CM | POA: Diagnosis not present

## 2021-01-06 DIAGNOSIS — E46 Unspecified protein-calorie malnutrition: Secondary | ICD-10-CM | POA: Diagnosis not present

## 2021-01-06 DIAGNOSIS — E669 Obesity, unspecified: Secondary | ICD-10-CM | POA: Diagnosis not present

## 2021-01-06 DIAGNOSIS — D508 Other iron deficiency anemias: Secondary | ICD-10-CM | POA: Diagnosis not present

## 2021-01-06 DIAGNOSIS — K580 Irritable bowel syndrome with diarrhea: Secondary | ICD-10-CM | POA: Diagnosis not present

## 2021-01-06 DIAGNOSIS — C180 Malignant neoplasm of cecum: Secondary | ICD-10-CM | POA: Diagnosis not present

## 2021-01-06 DIAGNOSIS — C189 Malignant neoplasm of colon, unspecified: Secondary | ICD-10-CM | POA: Diagnosis not present

## 2021-01-06 DIAGNOSIS — I110 Hypertensive heart disease with heart failure: Secondary | ICD-10-CM | POA: Diagnosis not present

## 2021-01-06 DIAGNOSIS — D630 Anemia in neoplastic disease: Secondary | ICD-10-CM | POA: Diagnosis not present

## 2021-01-11 DIAGNOSIS — C180 Malignant neoplasm of cecum: Secondary | ICD-10-CM | POA: Diagnosis not present

## 2021-01-11 DIAGNOSIS — I739 Peripheral vascular disease, unspecified: Secondary | ICD-10-CM | POA: Diagnosis not present

## 2021-01-11 DIAGNOSIS — I509 Heart failure, unspecified: Secondary | ICD-10-CM | POA: Diagnosis not present

## 2021-01-11 DIAGNOSIS — K580 Irritable bowel syndrome with diarrhea: Secondary | ICD-10-CM | POA: Diagnosis not present

## 2021-01-11 DIAGNOSIS — I4891 Unspecified atrial fibrillation: Secondary | ICD-10-CM | POA: Diagnosis not present

## 2021-01-11 DIAGNOSIS — E669 Obesity, unspecified: Secondary | ICD-10-CM | POA: Diagnosis not present

## 2021-01-11 DIAGNOSIS — I110 Hypertensive heart disease with heart failure: Secondary | ICD-10-CM | POA: Diagnosis not present

## 2021-01-11 DIAGNOSIS — D630 Anemia in neoplastic disease: Secondary | ICD-10-CM | POA: Diagnosis not present

## 2021-01-11 DIAGNOSIS — E46 Unspecified protein-calorie malnutrition: Secondary | ICD-10-CM | POA: Diagnosis not present

## 2021-01-18 DIAGNOSIS — C180 Malignant neoplasm of cecum: Secondary | ICD-10-CM | POA: Diagnosis not present

## 2021-01-18 DIAGNOSIS — D630 Anemia in neoplastic disease: Secondary | ICD-10-CM | POA: Diagnosis not present

## 2021-01-18 DIAGNOSIS — I110 Hypertensive heart disease with heart failure: Secondary | ICD-10-CM | POA: Diagnosis not present

## 2021-01-18 DIAGNOSIS — I739 Peripheral vascular disease, unspecified: Secondary | ICD-10-CM | POA: Diagnosis not present

## 2021-01-18 DIAGNOSIS — E669 Obesity, unspecified: Secondary | ICD-10-CM | POA: Diagnosis not present

## 2021-01-18 DIAGNOSIS — I4891 Unspecified atrial fibrillation: Secondary | ICD-10-CM | POA: Diagnosis not present

## 2021-01-18 DIAGNOSIS — E46 Unspecified protein-calorie malnutrition: Secondary | ICD-10-CM | POA: Diagnosis not present

## 2021-01-18 DIAGNOSIS — K580 Irritable bowel syndrome with diarrhea: Secondary | ICD-10-CM | POA: Diagnosis not present

## 2021-01-18 DIAGNOSIS — I509 Heart failure, unspecified: Secondary | ICD-10-CM | POA: Diagnosis not present

## 2021-01-24 DIAGNOSIS — I739 Peripheral vascular disease, unspecified: Secondary | ICD-10-CM | POA: Diagnosis not present

## 2021-01-24 DIAGNOSIS — E669 Obesity, unspecified: Secondary | ICD-10-CM | POA: Diagnosis not present

## 2021-01-24 DIAGNOSIS — E46 Unspecified protein-calorie malnutrition: Secondary | ICD-10-CM | POA: Diagnosis not present

## 2021-01-24 DIAGNOSIS — K580 Irritable bowel syndrome with diarrhea: Secondary | ICD-10-CM | POA: Diagnosis not present

## 2021-01-24 DIAGNOSIS — I110 Hypertensive heart disease with heart failure: Secondary | ICD-10-CM | POA: Diagnosis not present

## 2021-01-24 DIAGNOSIS — D630 Anemia in neoplastic disease: Secondary | ICD-10-CM | POA: Diagnosis not present

## 2021-01-24 DIAGNOSIS — I4891 Unspecified atrial fibrillation: Secondary | ICD-10-CM | POA: Diagnosis not present

## 2021-01-24 DIAGNOSIS — C180 Malignant neoplasm of cecum: Secondary | ICD-10-CM | POA: Diagnosis not present

## 2021-01-24 DIAGNOSIS — I509 Heart failure, unspecified: Secondary | ICD-10-CM | POA: Diagnosis not present

## 2021-02-02 DIAGNOSIS — I110 Hypertensive heart disease with heart failure: Secondary | ICD-10-CM | POA: Diagnosis not present

## 2021-02-02 DIAGNOSIS — I4891 Unspecified atrial fibrillation: Secondary | ICD-10-CM | POA: Diagnosis not present

## 2021-02-02 DIAGNOSIS — R197 Diarrhea, unspecified: Secondary | ICD-10-CM | POA: Diagnosis not present

## 2021-02-02 DIAGNOSIS — C4491 Basal cell carcinoma of skin, unspecified: Secondary | ICD-10-CM | POA: Diagnosis not present

## 2021-02-02 DIAGNOSIS — I509 Heart failure, unspecified: Secondary | ICD-10-CM | POA: Diagnosis not present

## 2021-02-02 DIAGNOSIS — R609 Edema, unspecified: Secondary | ICD-10-CM | POA: Diagnosis not present

## 2021-02-02 DIAGNOSIS — Z7901 Long term (current) use of anticoagulants: Secondary | ICD-10-CM | POA: Diagnosis not present

## 2021-02-02 DIAGNOSIS — Z79890 Hormone replacement therapy (postmenopausal): Secondary | ICD-10-CM | POA: Diagnosis not present

## 2021-02-02 DIAGNOSIS — E46 Unspecified protein-calorie malnutrition: Secondary | ICD-10-CM | POA: Diagnosis not present

## 2021-02-02 DIAGNOSIS — Z604 Social exclusion and rejection: Secondary | ICD-10-CM | POA: Diagnosis not present

## 2021-02-02 DIAGNOSIS — D6869 Other thrombophilia: Secondary | ICD-10-CM | POA: Diagnosis not present

## 2021-02-02 DIAGNOSIS — D630 Anemia in neoplastic disease: Secondary | ICD-10-CM | POA: Diagnosis not present

## 2021-02-02 DIAGNOSIS — I10 Essential (primary) hypertension: Secondary | ICD-10-CM | POA: Diagnosis not present

## 2021-02-02 DIAGNOSIS — E669 Obesity, unspecified: Secondary | ICD-10-CM | POA: Diagnosis not present

## 2021-02-02 DIAGNOSIS — K580 Irritable bowel syndrome with diarrhea: Secondary | ICD-10-CM | POA: Diagnosis not present

## 2021-02-02 DIAGNOSIS — I739 Peripheral vascular disease, unspecified: Secondary | ICD-10-CM | POA: Diagnosis not present

## 2021-02-02 DIAGNOSIS — C180 Malignant neoplasm of cecum: Secondary | ICD-10-CM | POA: Diagnosis not present

## 2021-02-10 DIAGNOSIS — R197 Diarrhea, unspecified: Secondary | ICD-10-CM | POA: Diagnosis not present

## 2021-02-13 DIAGNOSIS — R197 Diarrhea, unspecified: Secondary | ICD-10-CM | POA: Diagnosis not present

## 2021-02-13 DIAGNOSIS — D508 Other iron deficiency anemias: Secondary | ICD-10-CM | POA: Diagnosis not present

## 2021-02-24 ENCOUNTER — Ambulatory Visit (HOSPITAL_BASED_OUTPATIENT_CLINIC_OR_DEPARTMENT_OTHER): Payer: Medicare PPO | Admitting: Cardiovascular Disease

## 2021-02-27 ENCOUNTER — Other Ambulatory Visit: Payer: Self-pay | Admitting: Family Medicine

## 2021-02-27 DIAGNOSIS — Z1231 Encounter for screening mammogram for malignant neoplasm of breast: Secondary | ICD-10-CM

## 2021-03-06 ENCOUNTER — Encounter: Payer: Self-pay | Admitting: Physician Assistant

## 2021-03-06 DIAGNOSIS — R197 Diarrhea, unspecified: Secondary | ICD-10-CM | POA: Diagnosis not present

## 2021-03-08 DIAGNOSIS — D508 Other iron deficiency anemias: Secondary | ICD-10-CM | POA: Diagnosis not present

## 2021-03-08 DIAGNOSIS — R197 Diarrhea, unspecified: Secondary | ICD-10-CM | POA: Diagnosis not present

## 2021-03-08 DIAGNOSIS — K589 Irritable bowel syndrome without diarrhea: Secondary | ICD-10-CM | POA: Diagnosis not present

## 2021-03-15 ENCOUNTER — Emergency Department
Admission: EM | Admit: 2021-03-15 | Discharge: 2021-03-15 | Disposition: A | Discharge status: Home / Self Care | Payer: Medicare PPO | Source: Home / Self Care

## 2021-03-15 ENCOUNTER — Inpatient Hospital Stay (HOSPITAL_COMMUNITY): Payer: Medicare PPO

## 2021-03-15 ENCOUNTER — Emergency Department (HOSPITAL_COMMUNITY): Payer: Medicare PPO

## 2021-03-15 ENCOUNTER — Inpatient Hospital Stay (HOSPITAL_COMMUNITY)
Admission: EM | Admit: 2021-03-15 | Discharge: 2021-03-28 | DRG: 335 | Disposition: A | Discharge status: Skilled Nursing Facility | Payer: Medicare PPO | Attending: Internal Medicine | Admitting: Internal Medicine

## 2021-03-15 ENCOUNTER — Other Ambulatory Visit: Payer: Self-pay

## 2021-03-15 ENCOUNTER — Encounter (HOSPITAL_COMMUNITY): Payer: Self-pay

## 2021-03-15 DIAGNOSIS — Z85038 Personal history of other malignant neoplasm of large intestine: Secondary | ICD-10-CM | POA: Diagnosis not present

## 2021-03-15 DIAGNOSIS — I639 Cerebral infarction, unspecified: Secondary | ICD-10-CM | POA: Diagnosis present

## 2021-03-15 DIAGNOSIS — R110 Nausea: Secondary | ICD-10-CM

## 2021-03-15 DIAGNOSIS — R4701 Aphasia: Secondary | ICD-10-CM | POA: Diagnosis not present

## 2021-03-15 DIAGNOSIS — W19XXXA Unspecified fall, initial encounter: Secondary | ICD-10-CM

## 2021-03-15 DIAGNOSIS — Z9049 Acquired absence of other specified parts of digestive tract: Secondary | ICD-10-CM

## 2021-03-15 DIAGNOSIS — N200 Calculus of kidney: Secondary | ICD-10-CM | POA: Diagnosis not present

## 2021-03-15 DIAGNOSIS — K56609 Unspecified intestinal obstruction, unspecified as to partial versus complete obstruction: Secondary | ICD-10-CM | POA: Diagnosis not present

## 2021-03-15 DIAGNOSIS — K580 Irritable bowel syndrome with diarrhea: Secondary | ICD-10-CM | POA: Diagnosis not present

## 2021-03-15 DIAGNOSIS — I700 Atherosclerosis of aorta: Secondary | ICD-10-CM | POA: Diagnosis not present

## 2021-03-15 DIAGNOSIS — I671 Cerebral aneurysm, nonruptured: Secondary | ICD-10-CM | POA: Diagnosis not present

## 2021-03-15 DIAGNOSIS — K529 Noninfective gastroenteritis and colitis, unspecified: Secondary | ICD-10-CM | POA: Diagnosis present

## 2021-03-15 DIAGNOSIS — R197 Diarrhea, unspecified: Secondary | ICD-10-CM | POA: Diagnosis present

## 2021-03-15 DIAGNOSIS — K567 Ileus, unspecified: Secondary | ICD-10-CM | POA: Diagnosis present

## 2021-03-15 DIAGNOSIS — L8990 Pressure ulcer of unspecified site, unspecified stage: Secondary | ICD-10-CM | POA: Diagnosis not present

## 2021-03-15 DIAGNOSIS — K450 Other specified abdominal hernia with obstruction, without gangrene: Secondary | ICD-10-CM

## 2021-03-15 DIAGNOSIS — I63512 Cerebral infarction due to unspecified occlusion or stenosis of left middle cerebral artery: Secondary | ICD-10-CM | POA: Diagnosis not present

## 2021-03-15 DIAGNOSIS — I10 Essential (primary) hypertension: Secondary | ICD-10-CM | POA: Diagnosis not present

## 2021-03-15 DIAGNOSIS — R1030 Lower abdominal pain, unspecified: Secondary | ICD-10-CM | POA: Diagnosis not present

## 2021-03-15 DIAGNOSIS — R1111 Vomiting without nausea: Secondary | ICD-10-CM | POA: Diagnosis not present

## 2021-03-15 DIAGNOSIS — I480 Paroxysmal atrial fibrillation: Secondary | ICD-10-CM | POA: Diagnosis not present

## 2021-03-15 DIAGNOSIS — E785 Hyperlipidemia, unspecified: Secondary | ICD-10-CM | POA: Diagnosis present

## 2021-03-15 DIAGNOSIS — Z20822 Contact with and (suspected) exposure to covid-19: Secondary | ICD-10-CM | POA: Diagnosis present

## 2021-03-15 DIAGNOSIS — E871 Hypo-osmolality and hyponatremia: Secondary | ICD-10-CM | POA: Diagnosis not present

## 2021-03-15 DIAGNOSIS — G319 Degenerative disease of nervous system, unspecified: Secondary | ICD-10-CM | POA: Diagnosis not present

## 2021-03-15 DIAGNOSIS — Z7901 Long term (current) use of anticoagulants: Secondary | ICD-10-CM

## 2021-03-15 DIAGNOSIS — I63412 Cerebral infarction due to embolism of left middle cerebral artery: Secondary | ICD-10-CM | POA: Diagnosis not present

## 2021-03-15 DIAGNOSIS — I6340 Cerebral infarction due to embolism of unspecified cerebral artery: Secondary | ICD-10-CM | POA: Insufficient documentation

## 2021-03-15 DIAGNOSIS — Z043 Encounter for examination and observation following other accident: Secondary | ICD-10-CM | POA: Diagnosis not present

## 2021-03-15 DIAGNOSIS — N281 Cyst of kidney, acquired: Secondary | ICD-10-CM | POA: Diagnosis not present

## 2021-03-15 DIAGNOSIS — R29706 NIHSS score 6: Secondary | ICD-10-CM | POA: Diagnosis not present

## 2021-03-15 DIAGNOSIS — E2839 Other primary ovarian failure: Secondary | ICD-10-CM | POA: Diagnosis not present

## 2021-03-15 DIAGNOSIS — E7800 Pure hypercholesterolemia, unspecified: Secondary | ICD-10-CM | POA: Diagnosis not present

## 2021-03-15 DIAGNOSIS — K5669 Other partial intestinal obstruction: Secondary | ICD-10-CM | POA: Diagnosis not present

## 2021-03-15 DIAGNOSIS — I6389 Other cerebral infarction: Secondary | ICD-10-CM | POA: Diagnosis not present

## 2021-03-15 DIAGNOSIS — I63312 Cerebral infarction due to thrombosis of left middle cerebral artery: Secondary | ICD-10-CM | POA: Diagnosis not present

## 2021-03-15 DIAGNOSIS — R911 Solitary pulmonary nodule: Secondary | ICD-10-CM | POA: Diagnosis not present

## 2021-03-15 DIAGNOSIS — Z91041 Radiographic dye allergy status: Secondary | ICD-10-CM

## 2021-03-15 DIAGNOSIS — I119 Hypertensive heart disease without heart failure: Secondary | ICD-10-CM | POA: Diagnosis not present

## 2021-03-15 DIAGNOSIS — L89151 Pressure ulcer of sacral region, stage 1: Secondary | ICD-10-CM | POA: Diagnosis not present

## 2021-03-15 DIAGNOSIS — E876 Hypokalemia: Secondary | ICD-10-CM | POA: Diagnosis present

## 2021-03-15 DIAGNOSIS — K565 Intestinal adhesions [bands], unspecified as to partial versus complete obstruction: Secondary | ICD-10-CM | POA: Diagnosis not present

## 2021-03-15 DIAGNOSIS — I69320 Aphasia following cerebral infarction: Secondary | ICD-10-CM | POA: Diagnosis not present

## 2021-03-15 DIAGNOSIS — K6389 Other specified diseases of intestine: Secondary | ICD-10-CM | POA: Diagnosis not present

## 2021-03-15 DIAGNOSIS — K5939 Other megacolon: Secondary | ICD-10-CM | POA: Diagnosis not present

## 2021-03-15 DIAGNOSIS — E559 Vitamin D deficiency, unspecified: Secondary | ICD-10-CM | POA: Diagnosis not present

## 2021-03-15 DIAGNOSIS — R188 Other ascites: Secondary | ICD-10-CM | POA: Diagnosis not present

## 2021-03-15 DIAGNOSIS — K5651 Intestinal adhesions [bands], with partial obstruction: Secondary | ICD-10-CM | POA: Diagnosis not present

## 2021-03-15 DIAGNOSIS — C180 Malignant neoplasm of cecum: Secondary | ICD-10-CM | POA: Diagnosis present

## 2021-03-15 DIAGNOSIS — I4819 Other persistent atrial fibrillation: Secondary | ICD-10-CM | POA: Diagnosis not present

## 2021-03-15 DIAGNOSIS — Z4682 Encounter for fitting and adjustment of non-vascular catheter: Secondary | ICD-10-CM | POA: Diagnosis not present

## 2021-03-15 DIAGNOSIS — K5652 Intestinal adhesions [bands] with complete obstruction: Secondary | ICD-10-CM | POA: Diagnosis not present

## 2021-03-15 DIAGNOSIS — I6602 Occlusion and stenosis of left middle cerebral artery: Secondary | ICD-10-CM | POA: Diagnosis present

## 2021-03-15 DIAGNOSIS — I6782 Cerebral ischemia: Secondary | ICD-10-CM | POA: Diagnosis not present

## 2021-03-15 LAB — CBC WITH DIFFERENTIAL/PLATELET
Abs Immature Granulocytes: 0.05 10*3/uL (ref 0.00–0.07)
Basophils Absolute: 0.1 10*3/uL (ref 0.0–0.1)
Basophils Relative: 1 %
Eosinophils Absolute: 0.1 10*3/uL (ref 0.0–0.5)
Eosinophils Relative: 1 %
HCT: 48.7 % — ABNORMAL HIGH (ref 36.0–46.0)
Hemoglobin: 16 g/dL — ABNORMAL HIGH (ref 12.0–15.0)
Immature Granulocytes: 1 %
Lymphocytes Relative: 29 %
Lymphs Abs: 2.7 10*3/uL (ref 0.7–4.0)
MCH: 28.7 pg (ref 26.0–34.0)
MCHC: 32.9 g/dL (ref 30.0–36.0)
MCV: 87.4 fL (ref 80.0–100.0)
Monocytes Absolute: 0.7 10*3/uL (ref 0.1–1.0)
Monocytes Relative: 8 %
Neutro Abs: 5.8 10*3/uL (ref 1.7–7.7)
Neutrophils Relative %: 60 %
Platelets: 315 10*3/uL (ref 150–400)
RBC: 5.57 MIL/uL — ABNORMAL HIGH (ref 3.87–5.11)
RDW: 13.7 % (ref 11.5–15.5)
WBC: 9.4 10*3/uL (ref 4.0–10.5)
nRBC: 0 % (ref 0.0–0.2)

## 2021-03-15 LAB — COMPREHENSIVE METABOLIC PANEL
ALT: 21 U/L (ref 0–44)
AST: 24 U/L (ref 15–41)
Albumin: 4.7 g/dL (ref 3.5–5.0)
Alkaline Phosphatase: 113 U/L (ref 38–126)
Anion gap: 13 (ref 5–15)
BUN: 18 mg/dL (ref 8–23)
CO2: 27 mmol/L (ref 22–32)
Calcium: 11.1 mg/dL — ABNORMAL HIGH (ref 8.9–10.3)
Chloride: 98 mmol/L (ref 98–111)
Creatinine, Ser: 0.71 mg/dL (ref 0.44–1.00)
GFR, Estimated: >60 mL/min (ref >60)
Glucose, Bld: 129 mg/dL — ABNORMAL HIGH (ref 70–99)
Potassium: 3.4 mmol/L — ABNORMAL LOW (ref 3.5–5.1)
Sodium: 138 mmol/L (ref 135–145)
Total Bilirubin: 1.9 mg/dL — ABNORMAL HIGH (ref 0.3–1.2)
Total Protein: 7.7 g/dL (ref 6.5–8.1)

## 2021-03-15 LAB — LACTIC ACID, PLASMA: Lactic Acid, Venous: 1.8 mmol/L (ref 0.5–1.9)

## 2021-03-15 LAB — RESP PANEL BY RT-PCR (FLU A&B, COVID) ARPGX2
Influenza A by PCR: NEGATIVE
Influenza B by PCR: NEGATIVE
SARS Coronavirus 2 by RT PCR: NEGATIVE

## 2021-03-15 MED ORDER — METOPROLOL SUCCINATE ER 50 MG PO TB24
100.0000 mg | ORAL_TABLET | Freq: Every day | ORAL | Status: DC
  Administered 2021-03-16 – 2021-03-17 (×2): 100 mg via ORAL
  Filled 2021-03-15 (×3): qty 2

## 2021-03-15 MED ORDER — DILTIAZEM HCL ER BEADS 180 MG PO CP24: 180.0000 mg | ORAL_CAPSULE | Freq: Every day | ORAL | Status: DC

## 2021-03-15 MED ORDER — DILTIAZEM HCL ER COATED BEADS 180 MG PO CP24
180.0000 mg | ORAL_CAPSULE | Freq: Every day | ORAL | Status: DC
  Administered 2021-03-16: 180 mg via ORAL
  Filled 2021-03-15 (×2): qty 1

## 2021-03-15 MED ORDER — ONDANSETRON HCL 4 MG/2ML IJ SOLN
4.0000 mg | Freq: Four times a day (QID) | INTRAMUSCULAR | Status: DC | PRN
Start: 2021-03-15 — End: 2021-03-17
  Administered 2021-03-16 – 2021-03-17 (×2): 4 mg via INTRAVENOUS
  Filled 2021-03-15 (×3): qty 2

## 2021-03-15 MED ORDER — ACETAMINOPHEN 650 MG RE SUPP: 650.0000 mg | Freq: Four times a day (QID) | RECTAL | Status: DC | PRN

## 2021-03-15 MED ORDER — PROCHLORPERAZINE EDISYLATE 10 MG/2ML IJ SOLN
5.0000 mg | Freq: Once | INTRAMUSCULAR | Status: AC
  Administered 2021-03-15: 5 mg via INTRAVENOUS
  Filled 2021-03-15: qty 2

## 2021-03-15 MED ORDER — LACTATED RINGERS IV SOLN: INTRAVENOUS | Status: DC

## 2021-03-15 MED ORDER — ONDANSETRON HCL 4 MG PO TABS
4.0000 mg | ORAL_TABLET | Freq: Four times a day (QID) | ORAL | Status: DC | PRN
Start: 2021-03-15 — End: 2021-03-17

## 2021-03-15 MED ORDER — SODIUM CHLORIDE 0.9 % IV BOLUS
1000.0000 mL | Freq: Once | INTRAVENOUS | Status: AC
  Administered 2021-03-15: 1000 mL via INTRAVENOUS

## 2021-03-15 MED ORDER — MORPHINE SULFATE (PF) 2 MG/ML IV SOLN
2.0000 mg | INTRAVENOUS | Status: DC | PRN
  Administered 2021-03-16: 2 mg via INTRAVENOUS
  Filled 2021-03-15 (×2): qty 1

## 2021-03-15 MED ORDER — FENTANYL CITRATE PF 50 MCG/ML IJ SOSY
100.0000 ug | PREFILLED_SYRINGE | Freq: Once | INTRAMUSCULAR | Status: AC
Start: 2021-03-15 — End: 2021-03-15
  Administered 2021-03-15: 50 ug via INTRAVENOUS
  Filled 2021-03-15: qty 2

## 2021-03-15 MED ORDER — ACETAMINOPHEN 325 MG PO TABS: 650.0000 mg | ORAL_TABLET | Freq: Four times a day (QID) | ORAL | Status: DC | PRN

## 2021-03-15 NOTE — ED Notes (Signed)
Pt ambulatory without assistance.  

## 2021-03-15 NOTE — Consult Note (Signed)
CC: abd pain  Requesting provider: Dr Alvino Chapel  HPI: Robin [name] is an 86 y.o. female who is here for nausea and vomiting that began yesterday.  She presented to the ED due to persistent symptoms.  Pt had lap R colectomy in May for colon cancer by Dr Johney Maine.  Had a prolonged postop ileus after surgery requiring NG and TPN.  Pt anticoagulated due to A fib.  Last dose of Elliquis was AM of 9/28.  Past Medical History:  Diagnosis Date   Anxiety    AV bloc first degree    Carotid stenosis 03/02/2017   Hyperlipidemia    Hypertension    Lower extremity edema 12/30/2020   Lumbar pain    fell off of her horse and has low back pain at times   Persistent atrial fibrillation ([place]) 12/30/2020   Venous insufficiency of both lower extremities    Vertigo     Past Surgical History:  Procedure Laterality Date   ABDOMINAL HYSTERECTOMY     BIOPSY  10/28/2020   Procedure: BIOPSY;  Surgeon: Milus Banister, MD;  Location: [facility];  Service: Endoscopy;;   BREAST BIOPSY Left    BREAST EXCISIONAL BIOPSY Right    BREAST LUMPECTOMY WITH RADIOACTIVE SEED LOCALIZATION Left 05/05/2015   Procedure: LEFT BREAST LUMPECTOMY WITH RADIOACTIVE SEED LOCALIZATION;  Surgeon: Autumn Messing III, MD;  Location: [place];  Service: General;  Laterality: Left;   CHOLECYSTECTOMY     COLON RESECTION N/A 10/31/2020   Procedure: LAPAROSCOPIC RIGHT COLON RESECTION, lysis of adhesions, hemicolectomy, bilateral tap block;  Surgeon: [name], MD;  Location: [facility];  Service: General;  Laterality: N/A;   COLONOSCOPY WITH PROPOFOL N/A 10/28/2020   Procedure: COLONOSCOPY WITH PROPOFOL;  Surgeon: Milus Banister, MD;  Location[facility];  Service: Endoscopy;  Laterality: N/A;   EYE SURGERY     bil cataract surgery   LUNG SURGERY     POLYPECTOMY  10/28/2020   Procedure: POLYPECTOMY;  Surgeon: Milus Banister, MD;  [facility];  Service: Endoscopy;;   SUBMUCOSAL TATTOO INJECTION   10/28/2020   Procedure: SUBMUCOSAL TATTOO INJECTION;  Surgeon: Milus Banister, MD;  Location[facility];  Service: Endoscopy;;    Family History  Problem Relation Age of Onset   Hypertension Mother    Stroke Mother    Heart disease Mother    Heart disease Father    Hypertension Sister    Cancer Sister    Hypertension Brother    Hypertension Sister    Hypertension Sister    Breast cancer Neg Hx     Social:  reports that she has never smoked. She has never used smokeless tobacco. She reports current alcohol use. She reports that she does not use drugs.  Allergies:  Allergies  Allergen Reactions   Iodinated Diagnostic Agents Shortness Of Breath   Amlodipine     Shortness of breath   Codeine Nausea Only   Diphenhydramine Other (See Comments)    hypertension   Diphenhydramine Hcl     Hypertension    Pravastatin     Muscle aches   Zetia [Ezetimibe]    Sulfa Antibiotics Rash   Sulfa Drugs Cross Reactors Rash    Medications: I have reviewed the patient's current medications.  Results for orders placed or performed during the hospital encounter of 03/15/21 (from the past 48 hour(s))  Resp Panel by RT-PCR (Flu A&B, Covid) Nasopharyngeal Swab     Status: None  Collection Time: 03/15/21  7:21 PM   Specimen: Nasopharyngeal Swab; Nasopharyngeal(NP) swabs in vial transport medium  Result Value Ref Range   SARS Coronavirus 2 by RT PCR NEGATIVE NEGATIVE    Comment: (NOTE) SARS-CoV-2 target nucleic acids are NOT DETECTED.  The SARS-CoV-2 RNA is generally detectable in upper respiratory specimens during the acute phase of infection. The lowest concentration of SARS-CoV-2 viral copies this assay can detect is 138 copies/mL. A negative result does not preclude SARS-Cov-2 infection and should not be used as the sole basis for treatment or other patient management decisions. A negative result may occur with  improper specimen collection/handling, submission of specimen  other than nasopharyngeal swab, presence of viral mutation(s) within the areas targeted by this assay, and inadequate number of viral copies(<138 copies/mL). A negative result must be combined with clinical observations, patient history, and epidemiological information. The expected result is Negative.  Fact Sheet for Patients:  EntrepreneurPulse.com.au  Fact Sheet for Healthcare Providers:  IncredibleEmployment.be  This test is no t yet approved or cleared by the [place] FDA and  has been authorized for detection and/or diagnosis of SARS-CoV-2 by FDA under an Emergency Use Authorization (EUA). This EUA will remain  in effect (meaning this test can be used) for the duration of the COVID-19 declaration under Section 564(b)(1) of the Act, 21 U.S.C.section 360bbb-3(b)(1), unless the authorization is terminated  or revoked sooner.       Influenza A by PCR NEGATIVE NEGATIVE   Influenza B by PCR NEGATIVE NEGATIVE    Comment: (NOTE) The Xpert Xpress SARS-CoV-2/FLU/RSV plus assay is intended as an aid in the diagnosis of influenza from Nasopharyngeal swab specimens and should not be used as a sole basis for treatment. Nasal washings and aspirates are unacceptable for Xpert Xpress SARS-CoV-2/FLU/RSV testing.  Fact Sheet for Patients: EntrepreneurPulse.com.au  Fact Sheet for Healthcare Providers: IncredibleEmployment.be  This test is not yet approved or cleared by the [place] FDA and has been authorized for detection and/or diagnosis of SARS-CoV-2 by FDA under an Emergency Use Authorization (EUA). This EUA will remain in effect (meaning this test can be used) for the duration of the COVID-19 declaration under Section 564(b)(1) of the Act, 21 U.S.C. section 360bbb-3(b)(1), unless the authorization is terminated or revoked.  Performed at [facility], [place] [address] [place], [place] [address]   Comprehensive metabolic panel     Status: Abnormal   Collection Time: 03/15/21  7:25 PM  Result Value Ref Range   Sodium 138 135 - 145 mmol/L   Potassium 3.4 (L) 3.5 - 5.1 mmol/L   Chloride 98 98 - 111 mmol/L   CO2 27 22 - 32 mmol/L   Glucose, Bld 129 (H) 70 - 99 mg/dL    Comment: Glucose reference range applies only to samples taken after fasting for at least 8 hours.   BUN 18 8 - 23 mg/dL   Creatinine, Ser 0.71 0.44 - 1.00 mg/dL   Calcium 11.1 (H) 8.9 - 10.3 mg/dL   Total Protein 7.7 6.5 - 8.1 g/dL   Albumin 4.7 3.5 - 5.0 g/dL   AST 24 15 - 41 U/L   ALT 21 0 - 44 U/L   Alkaline Phosphatase 113 38 - 126 U/L   Total Bilirubin 1.9 (H) 0.3 - 1.2 mg/dL   GFR, Estimated >60 >60 mL/min    Comment: (NOTE) Calculated using the CKD-EPI Creatinine Equation (2021)    Anion gap 13 5 - 15    Comment:  Performed at [facility], [place] [address] [place], [place] [address]  CBC with Differential     Status: Abnormal   Collection Time: 03/15/21  7:25 PM  Result Value Ref Range   WBC 9.4 4.0 - 10.5 K/uL   RBC 5.57 (H) 3.87 - 5.11 MIL/uL   Hemoglobin 16.0 (H) 12.0 - 15.0 g/dL   HCT 48.7 (H) 36.0 - 46.0 %   MCV 87.4 80.0 - 100.0 fL   MCH 28.7 26.0 - 34.0 pg   MCHC 32.9 30.0 - 36.0 g/dL   RDW 13.7 11.5 - 15.5 %   Platelets 315 150 - 400 K/uL   nRBC 0.0 0.0 - 0.2 %   Neutrophils Relative % 60 %   Neutro Abs 5.8 1.7 - 7.7 K/uL   Lymphocytes Relative 29 %   Lymphs Abs 2.7 0.7 - 4.0 K/uL   Monocytes Relative 8 %   Monocytes Absolute 0.7 0.1 - 1.0 K/uL   Eosinophils Relative 1 %   Eosinophils Absolute 0.1 0.0 - 0.5 K/uL   Basophils Relative 1 %   Basophils Absolute 0.1 0.0 - 0.1 K/uL   Immature Granulocytes 1 %   Abs Immature Granulocytes 0.05 0.00 - 0.07 K/uL    Comment: Performed at [facility], [place] [address] [place], [place] [address]  Lactic acid, plasma     Status: None   Collection Time: 03/15/21  7:25 PM  Result  Value Ref Range   Lactic Acid, Venous 1.8 0.5 - 1.9 mmol/L    Comment: Performed at [facility], [place] [address] [place],  [address]    CT ABDOMEN PELVIS WO CONTRAST  Result Date: 03/15/2021 CLINICAL DATA:  Intractable nausea and vomiting EXAM: CT ABDOMEN AND PELVIS WITHOUT CONTRAST TECHNIQUE: Multidetector CT imaging of the abdomen and pelvis was performed following the standard protocol without IV contrast. COMPARISON:  11/23/2020 FINDINGS: Lower chest: The visualized lung bases are clear. The visualized heart and pericardium are unremarkable. Hepatobiliary: No focal liver abnormality is seen. Status post cholecystectomy. No biliary dilatation. Pancreas: Unremarkable Spleen: Unremarkable Adrenals/Urinary Tract: The adrenal glands are unremarkable. The kidneys are normal in size and position. Exophytic 10 cm simple cortical cyst arises from the lower pole the right kidney, unchanged. The kidneys are otherwise unremarkable. The bladder is unremarkable. Stomach/Bowel: Surgical changes of right hemicolectomy are again identified. There are numerous loops of dilated fluid-filled small bowel involving the mid jejunum with decompressed distal colon in keeping with a small-bowel obstruction. Moreover, there are 2 apparent points of transition with bowel pulled across midline into the right upper quadrant, best seen on axial image # 40/2 and 41/2. Moreover, there is focal narrowing of the superior mesenteric venous branches, best seen on image # 42/2 and whorled tethering of the small bowel, best seen on image # 44/2. Altogether, the findings are suggestive of an internal hernia. There is marked mesenteric edema involving the affected segment of mid small bowel suggestive of a closed loop obstruction. Mild ascites is present. No free intraperitoneal gas. No pneumatosis. Previously noted region of peritoneal fat necrosis anterior and superior to the bladder has resolved. Vascular/Lymphatic:  Extensive aortoiliac atherosclerotic calcification. No aortic aneurysm. No pathologic adenopathy within the abdomen and pelvis. Reproductive: Status post hysterectomy. No adnexal masses. Other: No abdominal wall hernia.  Rectum unremarkable. Musculoskeletal: No acute bone abnormality. Osseous structures are diffusely osteopenic. Compression deformities of T12, L2, and L3 are unchanged. No lytic or blastic bone lesion. IMPRESSION: Findings in  keeping with a mid small bowel obstruction, possibly representing a closed loop obstruction secondary to internal hernia, as described above. No pneumatosis or free intraperitoneal gas. Extensive mesenteric edema and trace ascites present. Surgical consultation is advised. Resolved area of fat necrosis noted on prior examination. Stable 10 cm simple exophytic cortical cyst arising from the lower pole the right kidney. Aortic Atherosclerosis (ICD10-I70.0). Electronically Signed   By: Fidela Salisbury M.D.   On: 03/15/2021 21:27   DG Abdomen Acute W/Chest  Result Date: 03/15/2021 CLINICAL DATA:  Vomiting EXAM: DG ABDOMEN ACUTE WITH 1 VIEW CHEST COMPARISON:  CT 11/21/2020, radiograph 11/21/2020, chest x-ray 11/13/2020 FINDINGS: Single-view chest demonstrates no focal opacity or pleural effusion. Normal cardiomediastinal silhouette with aortic atherosclerosis. Questionable nodule in the right mid lung. Supine and upright views of the abdomen demonstrate multiple dilated loops of central small bowel up to 5.2 cm with fluid levels on upright exam and relative paucity of distal gas concerning for a bowel obstruction. Surgical clips in the right upper quadrant. Lucency beneath left diaphragm felt to represent air-fluid level in the stomach. IMPRESSION: 1. No radiographic evidence for acute cardiopulmonary abnormality. Possible right midlung nodule which may be correlated with chest CT 2. Findings concerning for small bowel obstruction Electronically Signed   By: Donavan Foil M.D.    On: 03/15/2021 20:33    ROS - all of the below systems have been reviewed with the patient and positives are indicated with bold text General: chills, fever or night sweats Eyes: blurry vision or double vision ENT: epistaxis or sore throat Allergy/Immunology: itchy/watery eyes or nasal congestion Hematologic/Lymphatic: bleeding problems, blood clots or swollen lymph nodes Endocrine: temperature intolerance or unexpected weight changes Breast: new or changing breast lumps or nipple discharge Resp: cough, shortness of breath, or wheezing CV: chest pain or dyspnea on exertion GI: as per HPI GU: dysuria, trouble voiding, or hematuria MSK: joint pain or joint stiffness Neuro: TIA or stroke symptoms Derm: pruritus and skin lesion changes Psych: anxiety and depression  PE Blood pressure (!) 167/99, pulse 69, temperature (!) 97.3 F (36.3 C), temperature source Oral, resp. rate 17, SpO2 98 %. Constitutional: NAD; conversant; no deformities Eyes: Moist conjunctiva; no lid lag; anicteric; PERRL Neck: Trachea midline; no thyromegaly Lungs: Normal respiratory effort; no tactile fremitus CV: RRR; no palpable thrills; no pitting edema GI: Abd soft,mildly distended; no palpable hepatosplenomegaly MSK: Normal range of motion of extremities; no clubbing/cyanosis Psychiatric: Appropriate affect; alert and oriented x3 Lymphatic: No palpable cervical or axillary lymphadenopathy  Results for orders placed or performed during the hospital encounter of 03/15/21 (from the past 48 hour(s))  Resp Panel by RT-PCR (Flu A&B, Covid) Nasopharyngeal Swab     Status: None   Collection Time: 03/15/21  7:21 PM   Specimen: Nasopharyngeal Swab; Nasopharyngeal(NP) swabs in vial transport medium  Result Value Ref Range   SARS Coronavirus 2 by RT PCR NEGATIVE NEGATIVE    Comment: (NOTE) SARS-CoV-2 target nucleic acids are NOT DETECTED.  The SARS-CoV-2 RNA is generally detectable in upper respiratory specimens  during the acute phase of infection. The lowest concentration of SARS-CoV-2 viral copies this assay can detect is 138 copies/mL. A negative result does not preclude SARS-Cov-2 infection and should not be used as the sole basis for treatment or other patient management decisions. A negative result may occur with  improper specimen collection/handling, submission of specimen other than nasopharyngeal swab, presence of viral mutation(s) within the areas targeted by this assay, and inadequate number of viral  copies(<138 copies/mL). A negative result must be combined with clinical observations, patient history, and epidemiological information. The expected result is Negative.  Fact Sheet for Patients:  EntrepreneurPulse.com.au  Fact Sheet for Healthcare Providers:  IncredibleEmployment.be  This test is no t yet approved or cleared by the [place] FDA and  has been authorized for detection and/or diagnosis of SARS-CoV-2 by FDA under an Emergency Use Authorization (EUA). This EUA will remain  in effect (meaning this test can be used) for the duration of the COVID-19 declaration under Section 564(b)(1) of the Act, 21 U.S.C.section 360bbb-3(b)(1), unless the authorization is terminated  or revoked sooner.       Influenza A by PCR NEGATIVE NEGATIVE   Influenza B by PCR NEGATIVE NEGATIVE    Comment: (NOTE) The Xpert Xpress SARS-CoV-2/FLU/RSV plus assay is intended as an aid in the diagnosis of influenza from Nasopharyngeal swab specimens and should not be used as a sole basis for treatment. Nasal washings and aspirates are unacceptable for Xpert Xpress SARS-CoV-2/FLU/RSV testing.  Fact Sheet for Patients: EntrepreneurPulse.com.au  Fact Sheet for Healthcare Providers: IncredibleEmployment.be  This test is not yet approved or cleared by the [place] FDA and has been authorized for detection and/or diagnosis  of SARS-CoV-2 by FDA under an Emergency Use Authorization (EUA). This EUA will remain in effect (meaning this test can be used) for the duration of the COVID-19 declaration under Section 564(b)(1) of the Act, 21 U.S.C. section 360bbb-3(b)(1), unless the authorization is terminated or revoked.  Performed at [facility], [place] [address] [place], [place] [address]   Comprehensive metabolic panel     Status: Abnormal   Collection Time: 03/15/21  7:25 PM  Result Value Ref Range   Sodium 138 135 - 145 mmol/L   Potassium 3.4 (L) 3.5 - 5.1 mmol/L   Chloride 98 98 - 111 mmol/L   CO2 27 22 - 32 mmol/L   Glucose, Bld 129 (H) 70 - 99 mg/dL    Comment: Glucose reference range applies only to samples taken after fasting for at least 8 hours.   BUN 18 8 - 23 mg/dL   Creatinine, Ser 0.71 0.44 - 1.00 mg/dL   Calcium 11.1 (H) 8.9 - 10.3 mg/dL   Total Protein 7.7 6.5 - 8.1 g/dL   Albumin 4.7 3.5 - 5.0 g/dL   AST 24 15 - 41 U/L   ALT 21 0 - 44 U/L   Alkaline Phosphatase 113 38 - 126 U/L   Total Bilirubin 1.9 (H) 0.3 - 1.2 mg/dL   GFR, Estimated >60 >60 mL/min    Comment: (NOTE) Calculated using the CKD-EPI Creatinine Equation (2021)    Anion gap 13 5 - 15    Comment: Performed at [facility], [place] [address], [place],  [address]  CBC with Differential     Status: Abnormal   Collection Time: 03/15/21  7:25 PM  Result Value Ref Range   WBC 9.4 4.0 - 10.5 K/uL   RBC 5.57 (H) 3.87 - 5.11 MIL/uL   Hemoglobin 16.0 (H) 12.0 - 15.0 g/dL   HCT 48.7 (H) 36.0 - 46.0 %   MCV 87.4 80.0 - 100.0 fL   MCH 28.7 26.0 - 34.0 pg   MCHC 32.9 30.0 - 36.0 g/dL   RDW 13.7 11.5 - 15.5 %   Platelets 315 150 - 400 K/uL   nRBC 0.0 0.0 - 0.2 %   Neutrophils Relative % 60 %   Neutro Abs 5.8 1.7 -  7.7 K/uL   Lymphocytes Relative 29 %   Lymphs Abs 2.7 0.7 - 4.0 K/uL   Monocytes Relative 8 %   Monocytes Absolute 0.7 0.1 - 1.0 K/uL   Eosinophils Relative 1 %    Eosinophils Absolute 0.1 0.0 - 0.5 K/uL   Basophils Relative 1 %   Basophils Absolute 0.1 0.0 - 0.1 K/uL   Immature Granulocytes 1 %   Abs Immature Granulocytes 0.05 0.00 - 0.07 K/uL    Comment: Performed at [facility], [place] [address], [place], [place] [address]  Lactic acid, plasma     Status: None   Collection Time: 03/15/21  7:25 PM  Result Value Ref Range   Lactic Acid, Venous 1.8 0.5 - 1.9 mmol/L    Comment: Performed at [facility], [place] [address], [place], [place] [address]    CT ABDOMEN PELVIS WO CONTRAST  Result Date: 03/15/2021 CLINICAL DATA:  Intractable nausea and vomiting EXAM: CT ABDOMEN AND PELVIS WITHOUT CONTRAST TECHNIQUE: Multidetector CT imaging of the abdomen and pelvis was performed following the standard protocol without IV contrast. COMPARISON:  11/23/2020 FINDINGS: Lower chest: The visualized lung bases are clear. The visualized heart and pericardium are unremarkable. Hepatobiliary: No focal liver abnormality is seen. Status post cholecystectomy. No biliary dilatation. Pancreas: Unremarkable Spleen: Unremarkable Adrenals/Urinary Tract: The adrenal glands are unremarkable. The kidneys are normal in size and position. Exophytic 10 cm simple cortical cyst arises from the lower pole the right kidney, unchanged. The kidneys are otherwise unremarkable. The bladder is unremarkable. Stomach/Bowel: Surgical changes of right hemicolectomy are again identified. There are numerous loops of dilated fluid-filled small bowel involving the mid jejunum with decompressed distal colon in keeping with a small-bowel obstruction. Moreover, there are 2 apparent points of transition with bowel pulled across midline into the right upper quadrant, best seen on axial image # 40/2 and 41/2. Moreover, there is focal narrowing of the superior mesenteric venous branches, best seen on image # 42/2 and whorled tethering of the small bowel, best seen on image # 44/2.  Altogether, the findings are suggestive of an internal hernia. There is marked mesenteric edema involving the affected segment of mid small bowel suggestive of a closed loop obstruction. Mild ascites is present. No free intraperitoneal gas. No pneumatosis. Previously noted region of peritoneal fat necrosis anterior and superior to the bladder has resolved. Vascular/Lymphatic: Extensive aortoiliac atherosclerotic calcification. No aortic aneurysm. No pathologic adenopathy within the abdomen and pelvis. Reproductive: Status post hysterectomy. No adnexal masses. Other: No abdominal wall hernia.  Rectum unremarkable. Musculoskeletal: No acute bone abnormality. Osseous structures are diffusely osteopenic. Compression deformities of T12, L2, and L3 are unchanged. No lytic or blastic bone lesion. IMPRESSION: Findings in keeping with a mid small bowel obstruction, possibly representing a closed loop obstruction secondary to internal hernia, as described above. No pneumatosis or free intraperitoneal gas. Extensive mesenteric edema and trace ascites present. Surgical consultation is advised. Resolved area of fat necrosis noted on prior examination. Stable 10 cm simple exophytic cortical cyst arising from the lower pole the right kidney. Aortic Atherosclerosis (ICD10-I70.0). Electronically Signed   By: Fidela Salisbury M.D.   On: 03/15/2021 21:27   DG Abdomen Acute W/Chest  Result Date: 03/15/2021 CLINICAL DATA:  Vomiting EXAM: DG ABDOMEN ACUTE WITH 1 VIEW CHEST COMPARISON:  CT 11/21/2020, radiograph 11/21/2020, chest x-ray 11/13/2020 FINDINGS: Single-view chest demonstrates no focal opacity or pleural effusion. Normal cardiomediastinal silhouette with aortic atherosclerosis. Questionable nodule in the right mid lung. Supine  and upright views of the abdomen demonstrate multiple dilated loops of central small bowel up to 5.2 cm with fluid levels on upright exam and relative paucity of distal gas concerning for a bowel  obstruction. Surgical clips in the right upper quadrant. Lucency beneath left diaphragm felt to represent air-fluid level in the stomach. IMPRESSION: 1. No radiographic evidence for acute cardiopulmonary abnormality. Possible right midlung nodule which may be correlated with chest CT 2. Findings concerning for small bowel obstruction Electronically Signed   By: Donavan Foil M.D.   On: 03/15/2021 20:33     A/P: Itzayana E Bossard is an 86 y.o. female with SBO without signs of peritonitis.  Insert NG, NPO, hold Elliquis.  Rosario Adie, MD  [facility]

## 2021-03-15 NOTE — ED Triage Notes (Signed)
Pt presents with c/o abdominal pain with nausea since this morning. Pt reports that she has had episodes like this before.

## 2021-03-15 NOTE — ED Triage Notes (Signed)
Pt c/o abd pain and nausea since this am. Similar sxs last week but pain resolved on own. Colon cancer surgery in May. Normal BM this am, pain started after. Pain 8/10

## 2021-03-15 NOTE — Discharge Instructions (Addendum)
Advised/instructed patient to go to [facility] ED now for immediate evaluation of abdominal pain and possibly advanced imaging.  Patient agreed and verbalized understanding of these instructions and this plan of care this evening.

## 2021-03-15 NOTE — H&P (Signed)
History and Physical    Robin [name] LXB:[id number] DOB: July 22, 1934 DOA: 03/15/2021  PCP: [name], PA-C  Patient coming from: Home  I have personally briefly reviewed patient's old medical records in [place]  Chief Complaint: Abd pain  HPI: Robin [name] is a 86 y.o. female with medical history significant of A.Fib on eliquis, HTN.  Pt had GIB in May, colonoscopy revealed site of bleed to be colon CA in cecum.  Pt underwent hemicolectomy -> complete resection of Stage 1 colon CA.  Prolonged post-op course due to ileus requiring TPN.  No subsequent chemotherapy recd due to early stage and high chance of surgical cure.  Pt now presents to ED with c/o abd pain, N/V.  Symptoms onset yesterday, symptoms persistent, worsened today.  Abd pain is diffuse.  Nothing makes better or worse.  No fevers, chills, SOB.  Last took eliquis this AM.   ED Course: CT reveals SBO, looks like possibly closed loop SBO due to internal hernia.   Review of Systems: As per HPI, otherwise all review of systems negative.  Past Medical History:  Diagnosis Date   Anxiety    AV bloc first degree    Carotid stenosis 03/02/2017   Hyperlipidemia    Hypertension    Lower extremity edema 12/30/2020   Lumbar pain    fell off of her horse and has low back pain at times   Persistent atrial fibrillation ([place]) 12/30/2020   Venous insufficiency of both lower extremities    Vertigo     Past Surgical History:  Procedure Laterality Date   ABDOMINAL HYSTERECTOMY     BIOPSY  10/28/2020   Procedure: BIOPSY;  Surgeon: Milus Banister, MD;  Location: [facility];  Service: Endoscopy;;   BREAST BIOPSY Left    BREAST EXCISIONAL BIOPSY Right    BREAST LUMPECTOMY WITH RADIOACTIVE SEED LOCALIZATION Left 05/05/2015   Procedure: LEFT BREAST LUMPECTOMY WITH RADIOACTIVE SEED LOCALIZATION;  Surgeon: Autumn Messing III, MD;  Location: [facility];  Service: General;  Laterality: Left;    CHOLECYSTECTOMY     COLON RESECTION N/A 10/31/2020   Procedure: LAPAROSCOPIC RIGHT COLON RESECTION, lysis of adhesions, hemicolectomy, bilateral tap block;  Surgeon: [name], MD;  Location: [facility];  Service: General;  Laterality: N/A;   COLONOSCOPY WITH PROPOFOL N/A 10/28/2020   Procedure: COLONOSCOPY WITH PROPOFOL;  Surgeon: Milus Banister, MD;  Location: [facility];  Service: Endoscopy;  Laterality: N/A;   EYE SURGERY     bil cataract surgery   LUNG SURGERY     POLYPECTOMY  10/28/2020   Procedure: POLYPECTOMY;  Surgeon: Milus Banister, MD;  Location: [facility];  Service: Endoscopy;;   SUBMUCOSAL TATTOO INJECTION  10/28/2020   Procedure: SUBMUCOSAL TATTOO INJECTION;  Surgeon: Milus Banister, MD;  Location: [facility];  Service: Endoscopy;;     reports that she has never smoked. She has never used smokeless tobacco. She reports current alcohol use. She reports that she does not use drugs.  Allergies  Allergen Reactions   Iodinated Diagnostic Agents Shortness Of Breath   Amlodipine     Shortness of breath   Codeine Nausea Only   Diphenhydramine Other (See Comments)    hypertension   Diphenhydramine Hcl     Hypertension    Pravastatin     Muscle aches   Zetia [Ezetimibe]    Sulfa Antibiotics Rash   Sulfa Drugs Cross Reactors Rash    Family History  Problem Relation Age of  Onset   Hypertension Mother    Stroke Mother    Heart disease Mother    Heart disease Father    Hypertension Sister    Cancer Sister    Hypertension Brother    Hypertension Sister    Hypertension Sister    Breast cancer Neg Hx      Prior to Admission medications   Medication Sig Start Date End Date Taking? Authorizing Provider  acetaminophen (TYLENOL) 325 MG tablet Take 2 tablets (650 mg total) by mouth every 6 (six) hours as needed for mild pain, fever or headache. 11/29/20   Saverio Danker, PA-C  atorvastatin (LIPITOR) 10 MG tablet Take 0.5 tablets by mouth 3 (three) times a week.  09/02/20   [provider]  Cholecalciferol (VITAMIN D) 50 MCG (2000 UT) tablet Take 2,000 Units by mouth at bedtime.    [provider]  diphenoxylate-atropine (LOMOTIL) 2.5-0.025 MG tablet Take 1 tablet by mouth every 12 (twelve) hours as needed for diarrhea or loose stools (for diarrhea). 11/29/20   [name], MD  diphenoxylate-atropine (LOMOTIL) 2.5-0.025 MG tablet Take 1-2 tablets by mouth every 12 (twelve) hours as needed for diarrhea or loose stools. 11/29/20   Saverio Danker, PA-C  ELIQUIS 5 MG TABS tablet TAKE 1 TABLET BY MOUTH TWICE DAILY. 10/06/20   Skeet Latch, MD  estradiol (ESTRACE) 1 MG tablet Take 0.5 mg by mouth daily. 08/24/20   [provider]  ferrous sulfate 325 (65 FE) MG tablet Take 1 tablet (325 mg total) by mouth 2 (two) times daily with a meal. 11/29/20   Domenic Polite, MD  loperamide (IMODIUM) 2 MG capsule Take 1 capsule (2 mg total) by mouth as needed for diarrhea or loose stools. 11/29/20   Saverio Danker, PA-C  Lutein 20 MG CAPS Take 1 capsule by mouth every evening.    [provider]  metoprolol succinate (TOPROL-XL) 50 MG 24 hr tablet Take 0.5-2 tablets (25-100 mg total) by mouth 2 (two) times daily. Take 100mg  in am and 25mg  in evening 11/29/20   Domenic Polite, MD  ondansetron (ZOFRAN ODT) 4 MG disintegrating tablet Take 1 tablet (4 mg total) by mouth every 8 (eight) hours as needed for nausea or vomiting. 11/29/20   Saverio Danker, PA-C  polycarbophil (FIBERCON) 625 MG tablet Take 1 tablet (625 mg total) by mouth 2 (two) times daily. 11/29/20   [name], MD  potassium chloride SA (KLOR-CON) 20 MEQ tablet Take 1 tablet (20 mEq total) by mouth 2 (two) times daily. 11/29/20   Domenic Polite, MD  TIADYLT ER 180 MG 24 hr capsule TAKE (1) CAPSULE DAILY. Patient taking differently: Take 180 mg by mouth daily. 09/07/20   Skeet Latch, MD  tiZANidine (ZANAFLEX) 2 MG tablet Take 2 mg by mouth every 8 (eight) hours as needed for  muscle spasms. 10/03/20   [provider]  triamcinolone cream (KENALOG) 0.1 % Apply 1 application topically daily as needed (for rash).  09/13/14   [provider]  triamterene-hydrochlorothiazide (MAXZIDE-25) 37.5-25 MG tablet Take 1 tablet by mouth daily. 08/17/20   [provider]  vitamin A 10000 UNIT capsule Take 10,000 Units by mouth daily.    [provider]    Physical Exam: Vitals:   03/15/21 1940 03/15/21 1945 03/15/21 2000 03/15/21 2050  BP: (!) 182/96 (!) 177/109 (!) 167/99 (!) 167/99  Pulse: (!) 102 (!) 112 (!) 111 69  Resp: 18 14 16 17   Temp:      TempSrc:  SpO2: 100% 99% 98% 98%    Constitutional: NAD, calm, comfortable Eyes: PERRL, lids and conjunctivae normal ENMT: Mucous membranes are moist. Posterior pharynx clear of any exudate or lesions.Normal dentition.  Neck: normal, supple, no masses, no thyromegaly Respiratory: clear to auscultation bilaterally, no wheezing, no crackles. Normal respiratory effort. No accessory muscle use.  Cardiovascular: Regular rate and rhythm, no murmurs / rubs / gallops. No extremity edema. 2+ pedal pulses. No carotid bruits.  Abdomen: TTP diffusely. Musculoskeletal: no clubbing / cyanosis. No joint deformity upper and lower extremities. Good ROM, no contractures. Normal muscle tone.  Skin: no rashes, lesions, ulcers. No induration Neurologic: CN 2-12 grossly intact. Sensation intact, DTR normal. Strength 5/5 in all 4.  Psychiatric: Normal judgment and insight. Alert and oriented x 3. Normal mood.    Labs on Admission: I have personally reviewed following labs and imaging studies  CBC: Recent Labs  Lab 03/15/21 1925  WBC 9.4  NEUTROABS 5.8  HGB 16.0*  HCT 48.7*  MCV 87.4  PLT 852   Basic Metabolic Panel: Recent Labs  Lab 03/15/21 1925  NA 138  K 3.4*  CL 98  CO2 27  GLUCOSE 129*  BUN 18  CREATININE 0.71  CALCIUM 11.1*   GFR: CrCl cannot be calculated (Unknown ideal  weight.). Liver Function Tests: Recent Labs  Lab 03/15/21 1925  AST 24  ALT 21  ALKPHOS 113  BILITOT 1.9*  PROT 7.7  ALBUMIN 4.7   No results for input(s): LIPASE, AMYLASE in the last 168 hours. No results for input(s): AMMONIA in the last 168 hours. Coagulation Profile: No results for input(s): INR, PROTIME in the last 168 hours. Cardiac Enzymes: No results for input(s): CKTOTAL, CKMB, CKMBINDEX, TROPONINI in the last 168 hours. BNP (last 3 results) No results for input(s): PROBNP in the last 8760 hours. HbA1C: No results for input(s): HGBA1C in the last 72 hours. CBG: No results for input(s): GLUCAP in the last 168 hours. Lipid Profile: No results for input(s): CHOL, HDL, LDLCALC, TRIG, CHOLHDL, LDLDIRECT in the last 72 hours. Thyroid Function Tests: No results for input(s): TSH, T4TOTAL, FREET4, T3FREE, THYROIDAB in the last 72 hours. Anemia Panel: No results for input(s): VITAMINB12, FOLATE, FERRITIN, TIBC, IRON, RETICCTPCT in the last 72 hours. Urine analysis: No results found for: COLORURINE, APPEARANCEUR, LABSPEC, [place], GLUCOSEU, HGBUR, BILIRUBINUR, KETONESUR, PROTEINUR, UROBILINOGEN, NITRITE, LEUKOCYTESUR  Radiological Exams on Admission: CT ABDOMEN PELVIS WO CONTRAST  Result Date: 03/15/2021 CLINICAL DATA:  Intractable nausea and vomiting EXAM: CT ABDOMEN AND PELVIS WITHOUT CONTRAST TECHNIQUE: Multidetector CT imaging of the abdomen and pelvis was performed following the standard protocol without IV contrast. COMPARISON:  11/23/2020 FINDINGS: Lower chest: The visualized lung bases are clear. The visualized heart and pericardium are unremarkable. Hepatobiliary: No focal liver abnormality is seen. Status post cholecystectomy. No biliary dilatation. Pancreas: Unremarkable Spleen: Unremarkable Adrenals/Urinary Tract: The adrenal glands are unremarkable. The kidneys are normal in size and position. Exophytic 10 cm simple cortical cyst arises from the lower pole the right  kidney, unchanged. The kidneys are otherwise unremarkable. The bladder is unremarkable. Stomach/Bowel: Surgical changes of right hemicolectomy are again identified. There are numerous loops of dilated fluid-filled small bowel involving the mid jejunum with decompressed distal colon in keeping with a small-bowel obstruction. Moreover, there are 2 apparent points of transition with bowel pulled across midline into the right upper quadrant, best seen on axial image # 40/2 and 41/2. Moreover, there is focal narrowing of the superior mesenteric venous branches, best seen on  image # 42/2 and whorled tethering of the small bowel, best seen on image # 44/2. Altogether, the findings are suggestive of an internal hernia. There is marked mesenteric edema involving the affected segment of mid small bowel suggestive of a closed loop obstruction. Mild ascites is present. No free intraperitoneal gas. No pneumatosis. Previously noted region of peritoneal fat necrosis anterior and superior to the bladder has resolved. Vascular/Lymphatic: Extensive aortoiliac atherosclerotic calcification. No aortic aneurysm. No pathologic adenopathy within the abdomen and pelvis. Reproductive: Status post hysterectomy. No adnexal masses. Other: No abdominal wall hernia.  Rectum unremarkable. Musculoskeletal: No acute bone abnormality. Osseous structures are diffusely osteopenic. Compression deformities of T12, L2, and L3 are unchanged. No lytic or blastic bone lesion. IMPRESSION: Findings in keeping with a mid small bowel obstruction, possibly representing a closed loop obstruction secondary to internal hernia, as described above. No pneumatosis or free intraperitoneal gas. Extensive mesenteric edema and trace ascites present. Surgical consultation is advised. Resolved area of fat necrosis noted on prior examination. Stable 10 cm simple exophytic cortical cyst arising from the lower pole the right kidney. Aortic Atherosclerosis (ICD10-I70.0).  Electronically Signed   By: Fidela Salisbury M.D.   On: 03/15/2021 21:27   DG Abdomen Acute W/Chest  Result Date: 03/15/2021 CLINICAL DATA:  Vomiting EXAM: DG ABDOMEN ACUTE WITH 1 VIEW CHEST COMPARISON:  CT 11/21/2020, radiograph 11/21/2020, chest x-ray 11/13/2020 FINDINGS: Single-view chest demonstrates no focal opacity or pleural effusion. Normal cardiomediastinal silhouette with aortic atherosclerosis. Questionable nodule in the right mid lung. Supine and upright views of the abdomen demonstrate multiple dilated loops of central small bowel up to 5.2 cm with fluid levels on upright exam and relative paucity of distal gas concerning for a bowel obstruction. Surgical clips in the right upper quadrant. Lucency beneath left diaphragm felt to represent air-fluid level in the stomach. IMPRESSION: 1. No radiographic evidence for acute cardiopulmonary abnormality. Possible right midlung nodule which may be correlated with chest CT 2. Findings concerning for small bowel obstruction Electronically Signed   By: Donavan Foil M.D.   On: 03/15/2021 20:33    EKG: Independently reviewed.  Assessment/Plan Principal Problem:   Obstructed internal hernia Active Problems:   Paroxysmal atrial fibrillation (HCC)   Chronic anticoagulation   Cecal cancer & polyps s/p lap hemicolectomy 10/31/2020   Hypertension   SBO (small bowel obstruction) (HCC)    Closed loop SBO due to internal hernia - NPO NGT IVF: LR at 100 Zofran and morphine PRN Surgery coming to consult: Likely OR tomorrow Hold Eliquis PAF - Hold eliquis Cont BB and CCB PRN metoprolol for RVR HTN - Cont BB and CCB Hold diuretics H/o Iron Def anemia earlier this year - Resolved at this time H/o Colon CA - Stage 1 -> remission post resection Sounds like no chemo due to high chance of surgical cure.  DVT prophylaxis: SCDs Code Status: Full Family Communication: No family in room Disposition Plan: TBD, needs resolution of SBO first (likely  to OR) Consults called: Dr. Marcello Moores Admission status: Admit to inpatient  Severity of Illness: The appropriate patient status for this patient is INPATIENT. Inpatient status is judged to be reasonable and necessary in order to provide the required intensity of service to ensure the patient's safety. The patient's presenting symptoms, physical exam findings, and initial radiographic and laboratory data in the context of their chronic comorbidities is felt to place them at high risk for further clinical deterioration. Furthermore, it is not anticipated that the patient will be medically  stable for discharge from the hospital within 2 midnights of admission. The following factors support the patient status of inpatient.   IP status due to SBO requiring NGT.  Also SBO is likely closed loop SBO from internal hernia that will require surgery.   * I certify that at the point of admission it is my clinical judgment that the patient will require inpatient hospital care spanning beyond 2 midnights from the point of admission due to high intensity of service, high risk for further deterioration and high frequency of surveillance required.*   Emika Tiano M. DO [facility]  How to contact the [facility] Attending or Consulting provider [place] or covering provider during after hours [place], for this patient?  Check the care team in [facility] and look for a) attending/consulting [facility] provider listed and b) the [facility] team listed Log into [email or web address]  Amion Physician Scheduling and messaging for groups and whole hospitals  On call and physician scheduling software for group practices, residents, hospitalists and other medical providers for call, clinic, rotation and shift schedules. On[employer] is a hospital-wide system for scheduling doctors and paging doctors on call. EasyPlot is for scientific plotting and data analysis.  [email or web address]  and use [place]'s universal password to access. If you do not have  the password, please contact the hospital operator.  Locate the [facility] provider you are looking for under [facility] and page to a number that you can be directly reached. If you still have difficulty reaching the provider, please page the [facility] (Director on Call) for the Hospitalists listed on amion for assistance.  03/15/2021, 10:11 PM

## 2021-03-15 NOTE — ED Provider Notes (Signed)
[facility] DEPT Provider Note   CSN: [id number] Arrival date & time: 03/15/21  1835     History Chief Complaint  Patient presents with   Abdominal Pain    Robin [name] is a 86 y.o. female.   Abdominal Pain Associated symptoms: nausea and vomiting   Associated symptoms: no fever and no shortness of breath   Patient presents with abdominal pain.  Diffuse over the area.  Nausea and vomiting.  In May had partial colectomy for colon cancer.  Feeling worse.  Difficulty history now due to active vomiting.  Reportedly began yesterday.    Past Medical History:  Diagnosis Date   Anxiety    AV bloc first degree    Carotid stenosis 03/02/2017   Hyperlipidemia    Hypertension    Lower extremity edema 12/30/2020   Lumbar pain    fell off of her horse and has low back pain at times   Persistent atrial fibrillation ([place]) 12/30/2020   Venous insufficiency of both lower extremities    Vertigo     Patient Active Problem List   Diagnosis Date Noted   Persistent atrial fibrillation ([place]) 12/30/2020   Lower extremity edema 12/30/2020   Irritable bowel syndrome with diarrhea 11/23/2020   Decreased estrogen level 11/23/2020   Hypertension 11/23/2020   Kidney stone 11/23/2020   Solitary pulmonary nodule 11/23/2020   Vitamin D deficiency 11/23/2020   Hyperlipidemia 11/23/2020   Hypercalcemia 11/23/2020   Pressure injury of skin 11/15/2020   Protein-calorie malnutrition, severe ([place]) 11/11/2020   Hypophosphatemia 11/03/2020   Cecal cancer & polyps s/p lap hemicolectomy 10/31/2020 10/31/2020   High grade dysplasia in colonic adenomas transverse colon & splenic flexure 10/31/2020   Colon cancer ([place]) 10/31/2020   Colonic mass    GI bleed 10/27/2020   Anemia 10/26/2020   Chronic anticoagulation 02/12/2019   Venous insufficiency (chronic) (peripheral) 02/12/2019   Normal coronary arteries 02/12/2019   Carotid stenosis 03/02/2017   Paroxysmal atrial  fibrillation ([place]) 08/05/2015   First degree heart block by electrocardiogram 06/03/2014   Hypokalemia 03/02/2014   PAC (premature atrial contraction) 05/05/2012   Hypercholesterolemia 06/13/2011   Benign hypertensive heart disease without heart failure 12/11/2010    Past Surgical History:  Procedure Laterality Date   ABDOMINAL HYSTERECTOMY     BIOPSY  10/28/2020   Procedure: BIOPSY;  Surgeon: Milus Banister, MD;  Location: [facility];  Service: Endoscopy;;   BREAST BIOPSY Left    BREAST EXCISIONAL BIOPSY Right    BREAST LUMPECTOMY WITH RADIOACTIVE SEED LOCALIZATION Left 05/05/2015   Procedure: LEFT BREAST LUMPECTOMY WITH RADIOACTIVE SEED LOCALIZATION;  Surgeon: Autumn Messing III, MD;  Location: [facility];  Service: General;  Laterality: Left;   CHOLECYSTECTOMY     COLON RESECTION N/A 10/31/2020   Procedure: LAPAROSCOPIC RIGHT COLON RESECTION, lysis of adhesions, hemicolectomy, bilateral tap block;  Surgeon: [name], MD;  Location: [facility];  Service: General;  Laterality: N/A;   COLONOSCOPY WITH PROPOFOL N/A 10/28/2020   Procedure: COLONOSCOPY WITH PROPOFOL;  Surgeon: Milus Banister, MD;  Location: [facility];  Service: Endoscopy;  Laterality: N/A;   EYE SURGERY     bil cataract surgery   LUNG SURGERY     POLYPECTOMY  10/28/2020   Procedure: POLYPECTOMY;  Surgeon: Milus Banister, MD;  Location: [facility];  Service: Endoscopy;;   SUBMUCOSAL TATTOO INJECTION  10/28/2020   Procedure: SUBMUCOSAL TATTOO INJECTION;  Surgeon: Milus Banister, MD;  Location: [facility];  Service:  Endoscopy;;     OB History   No obstetric history on file.     Family History  Problem Relation Age of Onset   Hypertension Mother    Stroke Mother    Heart disease Mother    Heart disease Father    Hypertension Sister    Cancer Sister    Hypertension Brother    Hypertension Sister    Hypertension Sister    Breast cancer Neg Hx     Social History   Tobacco Use    Smoking status: Never   Smokeless tobacco: Never  Vaping Use   Vaping Use: Never used  Substance Use Topics   Alcohol use: Yes    Comment: social   Drug use: No    Home Medications Prior to Admission medications   Medication Sig Start Date End Date Taking? Authorizing Provider  acetaminophen (TYLENOL) 325 MG tablet Take 2 tablets (650 mg total) by mouth every 6 (six) hours as needed for mild pain, fever or headache. 11/29/20   Saverio Danker, PA-C  atorvastatin (LIPITOR) 10 MG tablet Take 0.5 tablets by mouth 3 (three) times a week. 09/02/20   [provider]  Cholecalciferol (VITAMIN D) 50 MCG (2000 UT) tablet Take 2,000 Units by mouth at bedtime.    [provider]  diphenoxylate-atropine (LOMOTIL) 2.5-0.025 MG tablet Take 1 tablet by mouth every 12 (twelve) hours as needed for diarrhea or loose stools (for diarrhea). 11/29/20   [name], MD  diphenoxylate-atropine (LOMOTIL) 2.5-0.025 MG tablet Take 1-2 tablets by mouth every 12 (twelve) hours as needed for diarrhea or loose stools. 11/29/20   Saverio Danker, PA-C  ELIQUIS 5 MG TABS tablet TAKE 1 TABLET BY MOUTH TWICE DAILY. 10/06/20   Skeet Latch, MD  estradiol (ESTRACE) 1 MG tablet Take 0.5 mg by mouth daily. 08/24/20   [provider]  ferrous sulfate 325 (65 FE) MG tablet Take 1 tablet (325 mg total) by mouth 2 (two) times daily with a meal. 11/29/20   Domenic Polite, MD  loperamide (IMODIUM) 2 MG capsule Take 1 capsule (2 mg total) by mouth as needed for diarrhea or loose stools. 11/29/20   Saverio Danker, PA-C  Lutein 20 MG CAPS Take 1 capsule by mouth every evening.    [provider]  metoprolol succinate (TOPROL-XL) 50 MG 24 hr tablet Take 0.5-2 tablets (25-100 mg total) by mouth 2 (two) times daily. Take 100mg  in am and 25mg  in evening 11/29/20   Domenic Polite, MD  ondansetron (ZOFRAN ODT) 4 MG disintegrating tablet Take 1 tablet (4 mg total) by mouth every 8 (eight) hours as needed for  nausea or vomiting. 11/29/20   Saverio Danker, PA-C  polycarbophil (FIBERCON) 625 MG tablet Take 1 tablet (625 mg total) by mouth 2 (two) times daily. 11/29/20   [name], MD  potassium chloride SA (KLOR-CON) 20 MEQ tablet Take 1 tablet (20 mEq total) by mouth 2 (two) times daily. 11/29/20   Domenic Polite, MD  TIADYLT ER 180 MG 24 hr capsule TAKE (1) CAPSULE DAILY. Patient taking differently: Take 180 mg by mouth daily. 09/07/20   Skeet Latch, MD  tiZANidine (ZANAFLEX) 2 MG tablet Take 2 mg by mouth every 8 (eight) hours as needed for muscle spasms. 10/03/20   [provider]  triamcinolone cream (KENALOG) 0.1 % Apply 1 application topically daily as needed (for rash).  09/13/14   [provider]  triamterene-hydrochlorothiazide (MAXZIDE-25) 37.5-25 MG tablet Take 1 tablet by mouth daily. 08/17/20  [provider]  vitamin A 10000 UNIT capsule Take 10,000 Units by mouth daily.    [provider]    Allergies    Iodinated diagnostic agents, Amlodipine, Codeine, Diphenhydramine, Diphenhydramine hcl, Pravastatin, Zetia [ezetimibe], Sulfa antibiotics, and Sulfa drugs cross reactors  Review of Systems   Review of Systems  Constitutional:  Positive for appetite change. Negative for fever.  Respiratory:  Negative for shortness of breath.   Gastrointestinal:  Positive for abdominal pain, nausea and vomiting.   Physical Exam Updated Vital Signs BP (!) 167/99   Pulse 69   Temp (!) 97.3 F (36.3 C) (Oral)   Resp 17   SpO2 98%   Physical Exam Vitals and nursing note reviewed.  Constitutional:      Comments: Patient is actively vomiting.  Cardiovascular:     Rate and Rhythm: Tachycardia present.  Pulmonary:     Effort: Pulmonary effort is normal.  Abdominal:     Tenderness: There is abdominal tenderness.     Comments: Diffuse abdominal tenderness may be worse in the lower abdomen.  May have some mild distention.  No definite hernia palpated but  difficult exam as patient is actively vomiting.  Skin:    General: Skin is warm.     Capillary Refill: Capillary refill takes less than 2 seconds.  Neurological:     Mental Status: She is alert and oriented to person, place, and time.    ED Results / Procedures / Treatments   Labs (all labs ordered are listed, but only abnormal results are displayed) Labs Reviewed  COMPREHENSIVE METABOLIC PANEL - Abnormal; Notable for the following components:      Result Value   Potassium 3.4 (*)    Glucose, Bld 129 (*)    Calcium 11.1 (*)    Total Bilirubin 1.9 (*)    All other components within normal limits  CBC WITH DIFFERENTIAL/PLATELET - Abnormal; Notable for the following components:   RBC 5.57 (*)    Hemoglobin 16.0 (*)    HCT 48.7 (*)    All other components within normal limits  RESP PANEL BY RT-PCR (FLU A&B, COVID) [id number]  LACTIC ACID, PLASMA  LACTIC ACID, PLASMA    EKG EKG Interpretation  Date/Time:  Wednesday March 15 2021 19:34:43 EDT Ventricular Rate:  101 PR Interval:    QRS Duration: 80 QT Interval:  349 QTC Calculation: 444 R Axis:   105 Text Interpretation: Atrial fibrillation Multiple ventricular premature complexes Right axis deviation Borderline repolarization abnormality Confirmed by Davonna Belling [id number]) on 03/15/2021 8:00:07 PM  Radiology CT ABDOMEN PELVIS WO CONTRAST  Result Date: 03/15/2021 CLINICAL DATA:  Intractable nausea and vomiting EXAM: CT ABDOMEN AND PELVIS WITHOUT CONTRAST TECHNIQUE: Multidetector CT imaging of the abdomen and pelvis was performed following the standard protocol without IV contrast. COMPARISON:  11/23/2020 FINDINGS: Lower chest: The visualized lung bases are clear. The visualized heart and pericardium are unremarkable. Hepatobiliary: No focal liver abnormality is seen. Status post cholecystectomy. No biliary dilatation. Pancreas: Unremarkable Spleen: Unremarkable Adrenals/Urinary Tract: The adrenal glands are unremarkable. The  kidneys are normal in size and position. Exophytic 10 cm simple cortical cyst arises from the lower pole the right kidney, unchanged. The kidneys are otherwise unremarkable. The bladder is unremarkable. Stomach/Bowel: Surgical changes of right hemicolectomy are again identified. There are numerous loops of dilated fluid-filled small bowel involving the mid jejunum with decompressed distal colon in keeping with a small-bowel obstruction. Moreover, there are 2 apparent points of transition with bowel pulled across  midline into the right upper quadrant, best seen on axial image # 40/2 and 41/2. Moreover, there is focal narrowing of the superior mesenteric venous branches, best seen on image # 42/2 and whorled tethering of the small bowel, best seen on image # 44/2. Altogether, the findings are suggestive of an internal hernia. There is marked mesenteric edema involving the affected segment of mid small bowel suggestive of a closed loop obstruction. Mild ascites is present. No free intraperitoneal gas. No pneumatosis. Previously noted region of peritoneal fat necrosis anterior and superior to the bladder has resolved. Vascular/Lymphatic: Extensive aortoiliac atherosclerotic calcification. No aortic aneurysm. No pathologic adenopathy within the abdomen and pelvis. Reproductive: Status post hysterectomy. No adnexal masses. Other: No abdominal wall hernia.  Rectum unremarkable. Musculoskeletal: No acute bone abnormality. Osseous structures are diffusely osteopenic. Compression deformities of T12, L2, and L3 are unchanged. No lytic or blastic bone lesion. IMPRESSION: Findings in keeping with a mid small bowel obstruction, possibly representing a closed loop obstruction secondary to internal hernia, as described above. No pneumatosis or free intraperitoneal gas. Extensive mesenteric edema and trace ascites present. Surgical consultation is advised. Resolved area of fat necrosis noted on prior examination. Stable 10 cm simple  exophytic cortical cyst arising from the lower pole the right kidney. Aortic Atherosclerosis (ICD10-I70.0). Electronically Signed   By: Fidela Salisbury M.D.   On: 03/15/2021 21:27   DG Abdomen Acute W/Chest  Result Date: 03/15/2021 CLINICAL DATA:  Vomiting EXAM: DG ABDOMEN ACUTE WITH 1 VIEW CHEST COMPARISON:  CT 11/21/2020, radiograph 11/21/2020, chest x-ray 11/13/2020 FINDINGS: Single-view chest demonstrates no focal opacity or pleural effusion. Normal cardiomediastinal silhouette with aortic atherosclerosis. Questionable nodule in the right mid lung. Supine and upright views of the abdomen demonstrate multiple dilated loops of central small bowel up to 5.2 cm with fluid levels on upright exam and relative paucity of distal gas concerning for a bowel obstruction. Surgical clips in the right upper quadrant. Lucency beneath left diaphragm felt to represent air-fluid level in the stomach. IMPRESSION: 1. No radiographic evidence for acute cardiopulmonary abnormality. Possible right midlung nodule which may be correlated with chest CT 2. Findings concerning for small bowel obstruction Electronically Signed   By: Donavan Foil M.D.   On: 03/15/2021 20:33    Procedures Procedures   Medications Ordered in ED Medications  prochlorperazine (COMPAZINE) injection 5 mg (5 mg Intravenous Given 03/15/21 1920)  fentaNYL (SUBLIMAZE) injection 100 mcg (50 mcg Intravenous Given 03/15/21 1938)  sodium chloride 0.9 % bolus 1,000 mL (1,000 mLs Intravenous New Bag/Given 03/15/21 2038)  prochlorperazine (COMPAZINE) injection 5 mg (5 mg Intravenous Given 03/15/21 2051)    ED Course  I have reviewed the triage vital signs and the nursing notes.  Pertinent labs & imaging results that were available during my care of the patient were reviewed by me and considered in my medical decision making (see chart for details).    MDM Rules/Calculators/A&P                          Patient presents with nausea vomit abdominal pain.   Lab work overall reassuring.  Heart rate is improved but does have a history of A. fib chronically.  No fevers.  Acute abdominal series did not show free air.  CT scan done noncontrast due to dye allergy.  Has bowel obstruction.  Possibly closed-loop.  Does have mesenteric edema.  Discussed with Dr. Marcello Moores.  Patient is on anticoagulation and did take it  this morning.  She recommended placement of NG tube and she will come and see patient.  No plans of emergent OR tonight however.  Would like patient medically optimized first.  Will discuss with hospitalist for admission.  CRITICAL CARE Performed by: Davonna Belling Total critical care time: 30 minutes Critical care time was exclusive of separately billable procedures and treating other patients. Critical care was necessary to treat or prevent imminent or life-threatening deterioration. Critical care was time spent personally by me on the following activities: development of treatment plan with patient and/or surrogate as well as nursing, discussions with consultants, evaluation of patient's response to treatment, examination of patient, obtaining history from patient or surrogate, ordering and performing treatments and interventions, ordering and review of laboratory studies, ordering and review of radiographic studies, pulse oximetry and re-evaluation of patient's condition.   Final Clinical Impression(s) / ED Diagnoses Final diagnoses:  Intestinal obstruction, unspecified cause, unspecified whether partial or complete [facility])    Rx / DC Orders ED Discharge Orders     None        Davonna Belling, MD 03/15/21 2150

## 2021-03-15 NOTE — ED Provider Notes (Signed)
[name]    CSN: [id number] Arrival date & time: 03/15/21  1723      History   Chief Complaint Chief Complaint  Patient presents with   Abdominal Pain    HPI Robin [name] is a 86 y.o. female.   HPI 86 year old female presents with abdominal pain since this morning.  Patient reports similar symptoms last week but pain resolved on its own.  Patient reports colon cancer surgery in May of this year.  PMH significant for GI bleed, colonic mass, colon cancer, irritable bowel syndrome with diarrhea  Past Medical History:  Diagnosis Date   Anxiety    AV bloc first degree    Carotid stenosis 03/02/2017   Hyperlipidemia    Hypertension    Lower extremity edema 12/30/2020   Lumbar pain    fell off of her horse and has low back pain at times   Persistent atrial fibrillation ([place]) 12/30/2020   Venous insufficiency of both lower extremities    Vertigo     Patient Active Problem List   Diagnosis Date Noted   Persistent atrial fibrillation ([place]) 12/30/2020   Lower extremity edema 12/30/2020   Irritable bowel syndrome with diarrhea 11/23/2020   Decreased estrogen level 11/23/2020   Hypertension 11/23/2020   Kidney stone 11/23/2020   Solitary pulmonary nodule 11/23/2020   Vitamin D deficiency 11/23/2020   Hyperlipidemia 11/23/2020   Hypercalcemia 11/23/2020   Pressure injury of skin 11/15/2020   Protein-calorie malnutrition, severe ([place]) 11/11/2020   Hypophosphatemia 11/03/2020   Cecal cancer & polyps s/p lap hemicolectomy 10/31/2020 10/31/2020   High grade dysplasia in colonic adenomas transverse colon & splenic flexure 10/31/2020   Colon cancer ([place]) 10/31/2020   Colonic mass    GI bleed 10/27/2020   Anemia 10/26/2020   Chronic anticoagulation 02/12/2019   Venous insufficiency (chronic) (peripheral) 02/12/2019   Normal coronary arteries 02/12/2019   Carotid stenosis 03/02/2017   Paroxysmal atrial fibrillation ([place]) 08/05/2015   First degree heart block  by electrocardiogram 06/03/2014   Hypokalemia 03/02/2014   PAC (premature atrial contraction) 05/05/2012   Hypercholesterolemia 06/13/2011   Benign hypertensive heart disease without heart failure 12/11/2010    Past Surgical History:  Procedure Laterality Date   ABDOMINAL HYSTERECTOMY     BIOPSY  10/28/2020   Procedure: BIOPSY;  Surgeon: Milus Banister, MD;  Location: [facility];  Service: Endoscopy;;   BREAST BIOPSY Left    BREAST EXCISIONAL BIOPSY Right    BREAST LUMPECTOMY WITH RADIOACTIVE SEED LOCALIZATION Left 05/05/2015   Procedure: LEFT BREAST LUMPECTOMY WITH RADIOACTIVE SEED LOCALIZATION;  Surgeon: Autumn Messing III, MD;  Location: [place];  Service: General;  Laterality: Left;   CHOLECYSTECTOMY     COLON RESECTION N/A 10/31/2020   Procedure: LAPAROSCOPIC RIGHT COLON RESECTION, lysis of adhesions, hemicolectomy, bilateral tap block;  Surgeon: Dejuana Weist [place], MD;  Location: [facility];  Service: General;  Laterality: N/A;   COLONOSCOPY WITH PROPOFOL N/A 10/28/2020   Procedure: COLONOSCOPY WITH PROPOFOL;  Surgeon: Milus Banister, MD;  Location: [facility];  Service: Endoscopy;  Laterality: N/A;   EYE SURGERY     bil cataract surgery   LUNG SURGERY     POLYPECTOMY  10/28/2020   Procedure: POLYPECTOMY;  Surgeon: Milus Banister, MD;  Location: [facility];  Service: Endoscopy;;   SUBMUCOSAL TATTOO INJECTION  10/28/2020   Procedure: SUBMUCOSAL TATTOO INJECTION;  Surgeon: Milus Banister, MD;  Location: [facility];  Service: Endoscopy;;    OB History  No obstetric history on file.      Home Medications    Prior to Admission medications   Medication Sig Start Date End Date Taking? Authorizing Provider  acetaminophen (TYLENOL) 325 MG tablet Take 2 tablets (650 mg total) by mouth every 6 (six) hours as needed for mild pain, fever or headache. 11/29/20   Saverio Danker, PA-C  atorvastatin (LIPITOR) 10 MG tablet Take 0.5 tablets by mouth 3 (three) times a week.  09/02/20   [provider]  Cholecalciferol (VITAMIN D) 50 MCG (2000 UT) tablet Take 2,000 Units by mouth at bedtime.    [provider]  diphenoxylate-atropine (LOMOTIL) 2.5-0.025 MG tablet Take 1 tablet by mouth every 12 (twelve) hours as needed for diarrhea or loose stools (for diarrhea). 11/29/20   Xabi Wittler [place], MD  diphenoxylate-atropine (LOMOTIL) 2.5-0.025 MG tablet Take 1-2 tablets by mouth every 12 (twelve) hours as needed for diarrhea or loose stools. 11/29/20   Saverio Danker, PA-C  ELIQUIS 5 MG TABS tablet TAKE 1 TABLET BY MOUTH TWICE DAILY. 10/06/20   Skeet Latch, MD  estradiol (ESTRACE) 1 MG tablet Take 0.5 mg by mouth daily. 08/24/20   [provider]  ferrous sulfate 325 (65 FE) MG tablet Take 1 tablet (325 mg total) by mouth 2 (two) times daily with a meal. 11/29/20   Domenic Polite, MD  loperamide (IMODIUM) 2 MG capsule Take 1 capsule (2 mg total) by mouth as needed for diarrhea or loose stools. 11/29/20   Saverio Danker, PA-C  Lutein 20 MG CAPS Take 1 capsule by mouth every evening.    [provider]  metoprolol succinate (TOPROL-XL) 50 MG 24 hr tablet Take 0.5-2 tablets (25-100 mg total) by mouth 2 (two) times daily. Take 100mg  in am and 25mg  in evening 11/29/20   Domenic Polite, MD  ondansetron (ZOFRAN ODT) 4 MG disintegrating tablet Take 1 tablet (4 mg total) by mouth every 8 (eight) hours as needed for nausea or vomiting. 11/29/20   Saverio Danker, PA-C  polycarbophil (FIBERCON) 625 MG tablet Take 1 tablet (625 mg total) by mouth 2 (two) times daily. 11/29/20   Myria Steenbergen [place], MD  potassium chloride SA (KLOR-CON) 20 MEQ tablet Take 1 tablet (20 mEq total) by mouth 2 (two) times daily. 11/29/20   Domenic Polite, MD  TIADYLT ER 180 MG 24 hr capsule TAKE (1) CAPSULE DAILY. Patient taking differently: Take 180 mg by mouth daily. 09/07/20   Skeet Latch, MD  tiZANidine (ZANAFLEX) 2 MG tablet Take 2 mg by mouth every 8 (eight) hours as needed for  muscle spasms. 10/03/20   [provider]  triamcinolone cream (KENALOG) 0.1 % Apply 1 application topically daily as needed (for rash).  09/13/14   [provider]  triamterene-hydrochlorothiazide (MAXZIDE-25) 37.5-25 MG tablet Take 1 tablet by mouth daily. 08/17/20   [provider]  vitamin A 10000 UNIT capsule Take 10,000 Units by mouth daily.    [provider]    Family History Family History  Problem Relation Age of Onset   Hypertension Mother    Stroke Mother    Heart disease Mother    Heart disease Father    Hypertension Sister    Cancer Sister    Hypertension Brother    Hypertension Sister    Hypertension Sister    Breast cancer Neg Hx     Social History Social History   Tobacco Use   Smoking status: Never   Smokeless tobacco: Never  Vaping Use   Vaping Use:  Never used  Substance Use Topics   Alcohol use: Yes    Comment: social   Drug use: No     Allergies   Iodinated diagnostic agents, Amlodipine, Codeine, Diphenhydramine, Diphenhydramine hcl, Pravastatin, Zetia [ezetimibe], Sulfa antibiotics, and Sulfa drugs cross reactors   Review of Systems Review of Systems  Gastrointestinal:  Positive for abdominal pain and nausea.    Physical Exam Triage Vital Signs ED Triage Vitals  Enc Vitals Group     BP 03/15/21 1735 (!) 194/101     Pulse Rate 03/15/21 1735 (!) 102     Resp 03/15/21 1735 17     Temp 03/15/21 1735 97.8 F (36.6 C)     Temp Source 03/15/21 1735 Oral     SpO2 03/15/21 1735 99 %     Weight --      Height --      Head Circumference --      Peak Flow --      Pain Score 03/15/21 1740 8     Pain Loc --      Pain Edu? --      Excl. in [place]? --    No data found.  Updated Vital Signs BP (!) 173/108 (BP Location: Right Arm)   Pulse (!) 102   Temp 97.8 F (36.6 C) (Oral)   Resp 17   SpO2 99%    2nd manuel BP-173/108 left arm, seated  Physical Exam Vitals and nursing note reviewed.  Constitutional:       General: She is not in acute distress.    Appearance: She is well-developed and normal weight. She is not ill-appearing.  HENT:     Head: Normocephalic and atraumatic.  Eyes:     Extraocular Movements: Extraocular movements intact.     Pupils: Pupils are equal, round, and reactive to light.  Cardiovascular:     Rate and Rhythm: Normal rate and regular rhythm.     Heart sounds: Normal heart sounds. No murmur heard.    Comments: Hypertensive Pulmonary:     Effort: Pulmonary effort is normal.     Breath sounds: Normal breath sounds.     Comments: No adventitious breath sounds noted Abdominal:     Tenderness: There is generalized abdominal tenderness and tenderness in the right lower quadrant and left lower quadrant. There is no right CVA tenderness, left CVA tenderness, guarding or rebound. Negative signs include Murphy's sign, Rovsing's sign, McBurney's sign, psoas sign and obturator sign.  Skin:    General: Skin is warm and dry.  Neurological:     General: No focal deficit present.     Mental Status: She is alert and oriented to person, place, and time.  Psychiatric:        Mood and Affect: Mood normal.        Behavior: Behavior normal.     UC Treatments / Results  Labs (all labs ordered are listed, but only abnormal results are displayed) Labs Reviewed - No data to display  EKG   Radiology No results found.  Procedures Procedures (including critical care time)  Medications Ordered in UC Medications - No data to display  Initial Impression / Assessment and Plan / UC Course  I have reviewed the triage vital signs and the nursing notes.  Pertinent labs & imaging results that were available during my care of the patient were reviewed by me and considered in my medical decision making (see chart for details).     MDM: 1.  Lower abdominal  pain-Advised/instructed patient to go to [facility] ED now for immediate evaluation of abdominal pain and possibly advanced  imaging.  Patient agreed and verbalized understanding of these instructions and this plan of care this evening. 2. Nausea-Zofran 4 mg ODT given once in clinic prior to discharge.  Patient discharged, hemodynamically stable. Final Clinical Impressions(s) / UC Diagnoses   Final diagnoses:  Lower abdominal pain  Nausea     Discharge Instructions      Advised/instructed patient to go to [facility] ED now for immediate evaluation of abdominal pain and possibly advanced imaging.  Patient agreed and verbalized understanding of these instructions and this plan of care this evening.     ED Prescriptions   None    PDMP not reviewed this encounter.   Eliezer Lofts, [place] 03/15/21 [phone]

## 2021-03-15 NOTE — ED Notes (Signed)
Patient is being discharged from the Urgent Care and sent to the Emergency Department via POV driven by daughter. Per Eliezer Lofts NP, patient is in need of higher level of care due to abd pain and need for advance imaging. Patient is aware and verbalizes understanding of plan of care.  Vitals:   03/15/21 1735 03/15/21 1806  BP: (!) 194/101 (!) 173/108  Pulse: (!) 102   Resp: 17   Temp: 97.8 F (36.6 C)   SpO2: 99%

## 2021-03-16 ENCOUNTER — Other Ambulatory Visit: Payer: Self-pay

## 2021-03-16 LAB — BASIC METABOLIC PANEL
Anion gap: 11 (ref 5–15)
BUN: 16 mg/dL (ref 8–23)
CO2: 26 mmol/L (ref 22–32)
Calcium: 10.2 mg/dL (ref 8.9–10.3)
Chloride: 102 mmol/L (ref 98–111)
Creatinine, Ser: 0.63 mg/dL (ref 0.44–1.00)
GFR, Estimated: >60 mL/min (ref >60)
Glucose, Bld: 160 mg/dL — ABNORMAL HIGH (ref 70–99)
Potassium: 3.5 mmol/L (ref 3.5–5.1)
Sodium: 139 mmol/L (ref 135–145)

## 2021-03-16 LAB — CBC
HCT: 44.1 % (ref 36.0–46.0)
Hemoglobin: 14.6 g/dL (ref 12.0–15.0)
MCH: 28.8 pg (ref 26.0–34.0)
MCHC: 33.1 g/dL (ref 30.0–36.0)
MCV: 87 fL (ref 80.0–100.0)
Platelets: 305 10*3/uL (ref 150–400)
RBC: 5.07 MIL/uL (ref 3.87–5.11)
RDW: 13.7 % (ref 11.5–15.5)
WBC: 14.2 10*3/uL — ABNORMAL HIGH (ref 4.0–10.5)
nRBC: 0 % (ref 0.0–0.2)

## 2021-03-16 LAB — LACTIC ACID, PLASMA: Lactic Acid, Venous: 1.9 mmol/L (ref 0.5–1.9)

## 2021-03-16 MED ORDER — LIP MEDEX EX OINT
TOPICAL_OINTMENT | CUTANEOUS | Status: DC | PRN
  Filled 2021-03-16: qty 7

## 2021-03-16 MED ORDER — DIATRIZOATE MEGLUMINE & SODIUM 66-10 % PO SOLN: 90.0000 mL | Freq: Once | ORAL | Status: DC

## 2021-03-16 MED ORDER — METOPROLOL TARTRATE 5 MG/5ML IV SOLN
5.0000 mg | INTRAVENOUS | Status: DC | PRN
  Administered 2021-03-16: 5 mg via INTRAVENOUS
  Filled 2021-03-16: qty 5

## 2021-03-16 MED ORDER — PROCHLORPERAZINE EDISYLATE 10 MG/2ML IJ SOLN
5.0000 mg | Freq: Once | INTRAMUSCULAR | Status: AC
  Administered 2021-03-16: 5 mg via INTRAVENOUS
  Filled 2021-03-16: qty 2

## 2021-03-16 MED ORDER — METOPROLOL TARTRATE 5 MG/5ML IV SOLN
INTRAVENOUS | Status: AC
  Administered 2021-03-16: 5 mg via INTRAVENOUS
  Filled 2021-03-16: qty 5

## 2021-03-16 NOTE — Progress Notes (Signed)
  Progress Note    Robin [name]   KNL:[id number]  DOB: 07-Oct-1934  DOA: 03/15/2021     1 Date of Service: 03/16/2021   Robin [name] is an 86 yo female with PMH persistent afib (on Eliquis), HTN, mild carotid stenosis who presented to the hospital with complaints of abdominal pain. Earlier this year she was treated for a GI bleed in May associated with localized adenocarcinoma of the ascending colon (s/p right colon resection on 10/31/20).  She also developed nausea and vomiting after admission.  An NG tube was placed and she underwent evaluation with surgery as well as CT abdomen/pelvis. Imaging showed a mid small bowel obstruction, possibly representing a closed-loop obstruction due to internal hernia.  Subjective:  Resting in bed comfortably.  Still having some abdominal pain but controlled with current pain regimen.  Denies any further vomiting since NG tube placed.  Hospital Problems * SBO (small bowel obstruction) ([place]) - History of abdominal surgery in May 2022.  Would be at risk for further adhesions.  CT showing concern for possible obstruction involving internal hernia - NG tube currently in place - General surgery following, appreciate assistance - If no improvement, tentative plan is for surgical intervention - Continue fluids and keep n.p.o. -Continue pain control  Paroxysmal atrial fibrillation ([facility]) - Eliquis currently on hold - Continue metoprolol and Cardizem  Cecal cancer & polyps s/p lap hemicolectomy 10/31/2020 - See obstruction.  At risk for development of adhesions  Hypertension - Continue metoprolol and diltiazem   Objective Vital signs were reviewed and unremarkable.  Vitals:   03/16/21 0125 03/16/21 0207 03/16/21 0526 03/16/21 0925  BP: (!) 171/102  (!) 150/88 (!) 183/89  Pulse: (!) 103  (!) 105 (!) 108  Resp: 20  19 16   Temp: 97.9 F (36.6 C)  98.3 F (36.8 C) 98.7 F (37.1 C)  TempSrc:    Oral  SpO2: 99%  98% 99%  Height:  5\' 4"  (1.626 m)         Exam General appearance: alert, cooperative, and no distress Head: Normocephalic, without obvious abnormality, atraumatic, NG tube in place Eyes:  EOMI Lungs: clear to auscultation bilaterally Heart: regular rate and rhythm and S1, S2 normal Abdomen:  Mild nonspecific tenderness throughout with no rebound or guarding.  Bowel sounds present Extremities:  Trace lower extremity edema Skin: mobility and turgor normal Neurologic: Grossly normal    Labs / Other Information My review of labs, imaging, notes and other tests shows no new significant findings.    Time spent: Greater than 50% of the 35 minute visit was spent in counseling/coordination of care for the patient as laid out in the A&P.  [name], MD [facility] 03/16/2021, 12:06 PM

## 2021-03-16 NOTE — Hospital Course (Addendum)
Robin [name] is an 86 yo female with PMH persistent afib (on Eliquis), HTN, mild carotid stenosis who presented to the hospital with complaints of abdominal pain. Earlier this year she was treated for a GI bleed in May associated with localized adenocarcinoma of the ascending colon (s/p right colon resection on 10/31/20).  She also developed nausea and vomiting after admission.  An NG tube was placed and she underwent evaluation with surgery as well as CT abdomen/pelvis. Imaging showed a mid small bowel obstruction, possibly representing a closed-loop obstruction due to internal hernia. She underwent LOA on 03/17/21.

## 2021-03-16 NOTE — Assessment & Plan Note (Addendum)
Blood pressure stable. See afib

## 2021-03-16 NOTE — Assessment & Plan Note (Addendum)
See obstruction.  At risk for development of adhesions (confirmed during ex-lap)

## 2021-03-16 NOTE — Assessment & Plan Note (Addendum)
History of abdominal surgery in May 2022. CT showing concern for possible obstruction involving internal hernia General surgery following, appreciate assistance patient failed to improve with supportive care and taken to OR on 9/30; underwent successful LOA Tolerating regular diet.  Still has some diarrhea. Add Fibercon. Hold lomitol.

## 2021-03-16 NOTE — Plan of Care (Signed)
  Problem: Education: Goal: Knowledge of General Education information will improve Description: Including pain rating scale, medication(s)/side effects and non-pharmacologic comfort measures Outcome: Progressing   Problem: Clinical Measurements: Goal: Will remain free from infection Outcome: Progressing Goal: Diagnostic test results will improve Outcome: Progressing Goal: Cardiovascular complication will be avoided Outcome: Progressing   Problem: Nutrition: Goal: Adequate nutrition will be maintained Outcome: Progressing   Problem: Elimination: Goal: Will not experience complications related to bowel motility Outcome: Progressing   Problem: Pain Managment: Goal: General experience of comfort will improve Outcome: Progressing   Problem: Safety: Goal: Ability to remain free from injury will improve Outcome: Progressing   Problem: Skin Integrity: Goal: Risk for impaired skin integrity will decrease Outcome: Progressing

## 2021-03-16 NOTE — ED Notes (Signed)
Per Dr. Hurley Cisco, pt cleared for transport to 5th floor at this time.

## 2021-03-16 NOTE — Assessment & Plan Note (Addendum)
Eliquis was on hold. Now resume at lower dose adjusted for weight.  Was On Lopressor and Cardizem.  Now on toprol xl Currently rate controlled

## 2021-03-17 ENCOUNTER — Encounter (HOSPITAL_COMMUNITY): Payer: Self-pay | Admitting: Internal Medicine

## 2021-03-17 ENCOUNTER — Inpatient Hospital Stay (HOSPITAL_COMMUNITY): Payer: Medicare PPO

## 2021-03-17 ENCOUNTER — Encounter (HOSPITAL_COMMUNITY)
Admission: EM | Disposition: A | Discharge status: Skilled Nursing Facility | Payer: Self-pay | Source: Home / Self Care | Attending: Internal Medicine

## 2021-03-17 ENCOUNTER — Inpatient Hospital Stay (HOSPITAL_COMMUNITY): Payer: Medicare PPO | Admitting: Registered Nurse

## 2021-03-17 HISTORY — PX: LAPAROTOMY: SHX154

## 2021-03-17 LAB — POCT I-STAT, CHEM 8
BUN: 12 mg/dL (ref 8–23)
Calcium, Ion: 1.16 mmol/L (ref 1.15–1.40)
Chloride: 96 mmol/L — ABNORMAL LOW (ref 98–111)
Creatinine, Ser: 0.5 mg/dL (ref 0.44–1.00)
Glucose, Bld: 124 mg/dL — ABNORMAL HIGH (ref 70–99)
HCT: 42 % (ref 36.0–46.0)
Hemoglobin: 14.3 g/dL (ref 12.0–15.0)
Potassium: 3.1 mmol/L — ABNORMAL LOW (ref 3.5–5.1)
Sodium: 136 mmol/L (ref 135–145)
TCO2: 29 mmol/L (ref 22–32)

## 2021-03-17 LAB — POCT I-STAT EG7
Acid-Base Excess: 1 mmol/L (ref 0.0–2.0)
Bicarbonate: 24.5 mmol/L (ref 20.0–28.0)
Calcium, Ion: 1.21 mmol/L (ref 1.15–1.40)
HCT: 35 % — ABNORMAL LOW (ref 36.0–46.0)
Hemoglobin: 11.9 g/dL — ABNORMAL LOW (ref 12.0–15.0)
O2 Saturation: 99 %
Potassium: 3.1 mmol/L — ABNORMAL LOW (ref 3.5–5.1)
Sodium: 136 mmol/L (ref 135–145)
TCO2: 26 mmol/L (ref 22–32)
pCO2, Ven: 33.6 mmHg — ABNORMAL LOW (ref 44.0–60.0)
pH, Ven: 7.471 — ABNORMAL HIGH (ref 7.250–7.430)
pO2, Ven: 118 mmHg — ABNORMAL HIGH (ref 32.0–45.0)

## 2021-03-17 LAB — CBC WITH DIFFERENTIAL/PLATELET
Abs Immature Granulocytes: 0.06 10*3/uL (ref 0.00–0.07)
Basophils Absolute: 0 10*3/uL (ref 0.0–0.1)
Basophils Relative: 0 %
Eosinophils Absolute: 0 10*3/uL (ref 0.0–0.5)
Eosinophils Relative: 0 %
HCT: 41.8 % (ref 36.0–46.0)
Hemoglobin: 13.9 g/dL (ref 12.0–15.0)
Immature Granulocytes: 0 %
Lymphocytes Relative: 13 %
Lymphs Abs: 1.8 10*3/uL (ref 0.7–4.0)
MCH: 28.9 pg (ref 26.0–34.0)
MCHC: 33.3 g/dL (ref 30.0–36.0)
MCV: 86.9 fL (ref 80.0–100.0)
Monocytes Absolute: 0.9 10*3/uL (ref 0.1–1.0)
Monocytes Relative: 7 %
Neutro Abs: 11.3 10*3/uL — ABNORMAL HIGH (ref 1.7–7.7)
Neutrophils Relative %: 80 %
Platelets: 273 10*3/uL (ref 150–400)
RBC: 4.81 MIL/uL (ref 3.87–5.11)
RDW: 13.8 % (ref 11.5–15.5)
WBC: 14.1 10*3/uL — ABNORMAL HIGH (ref 4.0–10.5)
nRBC: 0 % (ref 0.0–0.2)

## 2021-03-17 LAB — BASIC METABOLIC PANEL
Anion gap: 10 (ref 5–15)
BUN: 12 mg/dL (ref 8–23)
CO2: 27 mmol/L (ref 22–32)
Calcium: 9.8 mg/dL (ref 8.9–10.3)
Chloride: 99 mmol/L (ref 98–111)
Creatinine, Ser: 0.61 mg/dL (ref 0.44–1.00)
GFR, Estimated: >60 mL/min (ref >60)
Glucose, Bld: 117 mg/dL — ABNORMAL HIGH (ref 70–99)
Potassium: 2.6 mmol/L — CL (ref 3.5–5.1)
Sodium: 136 mmol/L (ref 135–145)

## 2021-03-17 LAB — SURGICAL PCR SCREEN
MRSA, PCR: NEGATIVE
Staphylococcus aureus: NEGATIVE

## 2021-03-17 LAB — MAGNESIUM: Magnesium: 1.6 mg/dL — ABNORMAL LOW (ref 1.7–2.4)

## 2021-03-17 SURGERY — LAPAROTOMY, EXPLORATORY
Anesthesia: General | Site: Abdomen

## 2021-03-17 MED ORDER — 0.9 % SODIUM CHLORIDE (POUR BTL) OPTIME
TOPICAL | Status: DC | PRN
  Administered 2021-03-17: 2000 mL

## 2021-03-17 MED ORDER — SODIUM CHLORIDE 0.9% FLUSH: 9.0000 mL | INTRAVENOUS | Status: DC | PRN

## 2021-03-17 MED ORDER — LIDOCAINE 2% (20 MG/ML) 5 ML SYRINGE
INTRAMUSCULAR | Status: DC | PRN
  Administered 2021-03-17: 1.5 mg/kg/h via INTRAVENOUS
  Administered 2021-03-17: 60 mg via INTRAVENOUS

## 2021-03-17 MED ORDER — KCL IN DEXTROSE-NACL 40-5-0.45 MEQ/L-%-% IV SOLN
INTRAVENOUS | Status: DC
  Filled 2021-03-17 (×6): qty 1000

## 2021-03-17 MED ORDER — LACTATED RINGERS IV SOLN: INTRAVENOUS | Status: DC

## 2021-03-17 MED ORDER — FENTANYL CITRATE PF 50 MCG/ML IJ SOSY
PREFILLED_SYRINGE | INTRAMUSCULAR | Status: AC
  Filled 2021-03-17: qty 1

## 2021-03-17 MED ORDER — FENTANYL CITRATE (PF) 100 MCG/2ML IJ SOLN
INTRAMUSCULAR | Status: DC | PRN
  Administered 2021-03-17: 25 ug via INTRAVENOUS
  Administered 2021-03-17: 50 ug via INTRAVENOUS

## 2021-03-17 MED ORDER — ONDANSETRON HCL 4 MG/2ML IJ SOLN
INTRAMUSCULAR | Status: DC | PRN
  Administered 2021-03-17: 4 mg via INTRAVENOUS

## 2021-03-17 MED ORDER — DEXAMETHASONE SODIUM PHOSPHATE 10 MG/ML IJ SOLN
INTRAMUSCULAR | Status: DC | PRN
  Administered 2021-03-17: 10 mg via INTRAVENOUS

## 2021-03-17 MED ORDER — ONDANSETRON 4 MG PO TBDP: 4.0000 mg | ORAL_TABLET | Freq: Four times a day (QID) | ORAL | Status: DC | PRN

## 2021-03-17 MED ORDER — CHLORHEXIDINE GLUCONATE CLOTH 2 % EX PADS
6.0000 | MEDICATED_PAD | Freq: Once | CUTANEOUS | Status: AC
  Administered 2021-03-17: 6 via TOPICAL

## 2021-03-17 MED ORDER — PROPOFOL 10 MG/ML IV BOLUS
INTRAVENOUS | Status: DC | PRN
  Administered 2021-03-17: 50 mg via INTRAVENOUS

## 2021-03-17 MED ORDER — HYDRALAZINE HCL 20 MG/ML IJ SOLN
10.0000 mg | INTRAMUSCULAR | Status: DC | PRN
  Administered 2021-03-20 (×2): 10 mg via INTRAVENOUS
  Filled 2021-03-17: qty 1

## 2021-03-17 MED ORDER — ONDANSETRON HCL 4 MG/2ML IJ SOLN: 4.0000 mg | Freq: Four times a day (QID) | INTRAMUSCULAR | Status: DC | PRN

## 2021-03-17 MED ORDER — METOPROLOL TARTRATE 5 MG/5ML IV SOLN
INTRAVENOUS | Status: DC | PRN
  Administered 2021-03-17: 2 mg via INTRAVENOUS

## 2021-03-17 MED ORDER — POTASSIUM CHLORIDE 10 MEQ/100ML IV SOLN
10.0000 meq | INTRAVENOUS | Status: DC
  Administered 2021-03-17 (×4): 10 meq via INTRAVENOUS
  Filled 2021-03-17 (×5): qty 100

## 2021-03-17 MED ORDER — SODIUM CHLORIDE 0.9 % IV SOLN
1.0000 g | INTRAVENOUS | Status: AC
  Administered 2021-03-17: 1 g via INTRAVENOUS
  Filled 2021-03-17: qty 1

## 2021-03-17 MED ORDER — HYDROMORPHONE 1 MG/ML IV SOLN
INTRAVENOUS | Status: DC
Start: 2021-03-17 — End: 2021-03-20
  Administered 2021-03-17: 0.2 mg via INTRAVENOUS
  Administered 2021-03-17: 30 mg via INTRAVENOUS
  Administered 2021-03-18: 0 mg via INTRAVENOUS
  Administered 2021-03-18: 0.2 mg via INTRAVENOUS
  Administered 2021-03-18: 1 mg via INTRAVENOUS
  Administered 2021-03-18 – 2021-03-19 (×5): 0 mg via INTRAVENOUS
  Administered 2021-03-20: 0.4 mg via INTRAVENOUS
  Administered 2021-03-20: 0 mg via INTRAVENOUS
  Filled 2021-03-17: qty 30

## 2021-03-17 MED ORDER — NALOXONE HCL 0.4 MG/ML IJ SOLN: 0.4000 mg | INTRAMUSCULAR | Status: DC | PRN

## 2021-03-17 MED ORDER — ENOXAPARIN SODIUM 40 MG/0.4ML IJ SOSY
40.0000 mg | PREFILLED_SYRINGE | INTRAMUSCULAR | Status: DC
  Administered 2021-03-18 – 2021-03-22 (×5): 40 mg via SUBCUTANEOUS
  Filled 2021-03-17 (×5): qty 0.4

## 2021-03-17 MED ORDER — PHENYLEPHRINE HCL-NACL 20-0.9 MG/250ML-% IV SOLN
INTRAVENOUS | Status: DC | PRN
  Administered 2021-03-17: 50 ug/min via INTRAVENOUS

## 2021-03-17 MED ORDER — CHLORHEXIDINE GLUCONATE CLOTH 2 % EX PADS
6.0000 | MEDICATED_PAD | Freq: Every day | CUTANEOUS | Status: DC
  Administered 2021-03-18 – 2021-03-24 (×6): 6 via TOPICAL

## 2021-03-17 MED ORDER — DIPHENHYDRAMINE HCL 50 MG/ML IJ SOLN: 12.5000 mg | Freq: Four times a day (QID) | INTRAMUSCULAR | Status: DC | PRN

## 2021-03-17 MED ORDER — CHLORHEXIDINE GLUCONATE 0.12 % MT SOLN
15.0000 mL | Freq: Once | OROMUCOSAL | Status: AC
  Administered 2021-03-17: 15 mL via OROMUCOSAL

## 2021-03-17 MED ORDER — FENTANYL CITRATE PF 50 MCG/ML IJ SOSY
25.0000 ug | PREFILLED_SYRINGE | INTRAMUSCULAR | Status: DC | PRN
  Administered 2021-03-17 (×2): 50 ug via INTRAVENOUS

## 2021-03-17 MED ORDER — FENTANYL CITRATE (PF) 100 MCG/2ML IJ SOLN
INTRAMUSCULAR | Status: AC
  Filled 2021-03-17: qty 2

## 2021-03-17 MED ORDER — MAGNESIUM SULFATE 2 GM/50ML IV SOLN
2.0000 g | Freq: Once | INTRAVENOUS | Status: AC
  Administered 2021-03-17: 2 g via INTRAVENOUS
  Filled 2021-03-17: qty 50

## 2021-03-17 MED ORDER — CHLORHEXIDINE GLUCONATE CLOTH 2 % EX PADS: 6.0000 | MEDICATED_PAD | Freq: Once | CUTANEOUS | Status: AC

## 2021-03-17 MED ORDER — SUGAMMADEX SODIUM 200 MG/2ML IV SOLN
INTRAVENOUS | Status: DC | PRN
  Administered 2021-03-17: 200 mg via INTRAVENOUS

## 2021-03-17 MED ORDER — LABETALOL HCL 5 MG/ML IV SOLN
10.0000 mg | INTRAVENOUS | Status: DC | PRN
  Administered 2021-03-17 – 2021-03-21 (×4): 10 mg via INTRAVENOUS
  Filled 2021-03-17 (×4): qty 4

## 2021-03-17 MED ORDER — PANTOPRAZOLE SODIUM 40 MG IV SOLR
40.0000 mg | Freq: Every day | INTRAVENOUS | Status: DC
  Administered 2021-03-17 – 2021-03-21 (×5): 40 mg via INTRAVENOUS
  Filled 2021-03-17 (×5): qty 40

## 2021-03-17 MED ORDER — POTASSIUM CHLORIDE 10 MEQ/100ML IV SOLN
10.0000 meq | INTRAVENOUS | Status: AC
Start: 2021-03-17 — End: 2021-03-17
  Administered 2021-03-17 (×3): 10 meq via INTRAVENOUS
  Filled 2021-03-17: qty 100

## 2021-03-17 MED ORDER — ACETAMINOPHEN 325 MG PO TABS: 650.0000 mg | ORAL_TABLET | Freq: Four times a day (QID) | ORAL | Status: DC | PRN

## 2021-03-17 MED ORDER — DIPHENHYDRAMINE HCL 12.5 MG/5ML PO ELIX: 12.5000 mg | ORAL_SOLUTION | Freq: Four times a day (QID) | ORAL | Status: DC | PRN

## 2021-03-17 MED ORDER — PHENYLEPHRINE 40 MCG/ML (10ML) SYRINGE FOR IV PUSH (FOR BLOOD PRESSURE SUPPORT)
PREFILLED_SYRINGE | INTRAVENOUS | Status: DC | PRN
  Administered 2021-03-17: 160 ug via INTRAVENOUS

## 2021-03-17 MED ORDER — ROCURONIUM BROMIDE 10 MG/ML (PF) SYRINGE
PREFILLED_SYRINGE | INTRAVENOUS | Status: DC | PRN
  Administered 2021-03-17: 60 mg via INTRAVENOUS

## 2021-03-17 MED ORDER — ACETAMINOPHEN 650 MG RE SUPP: 650.0000 mg | Freq: Four times a day (QID) | RECTAL | Status: DC | PRN

## 2021-03-17 MED ORDER — ONDANSETRON HCL 4 MG/2ML IJ SOLN
4.0000 mg | Freq: Four times a day (QID) | INTRAMUSCULAR | Status: DC | PRN
  Administered 2021-03-19: 4 mg via INTRAVENOUS
  Filled 2021-03-17: qty 2

## 2021-03-17 SURGICAL SUPPLY — 45 items
APL PRP STRL LF DISP 70% ISPRP (MISCELLANEOUS) ×1
APL SWBSTK 6 STRL LF DISP (MISCELLANEOUS) ×1
APPLICATOR COTTON TIP 6 STRL (MISCELLANEOUS) ×1 IMPLANT
APPLICATOR COTTON TIP 6IN STRL (MISCELLANEOUS) ×2 IMPLANT
BAG COUNTER SPONGE SURGICOUNT (BAG) IMPLANT
BAG SPNG CNTER NS LX DISP (BAG)
BLADE EXTENDED COATED 6.5IN (ELECTRODE) IMPLANT
BLADE HEX COATED 2.75 (ELECTRODE) ×2 IMPLANT
CHLORAPREP W/TINT 26 (MISCELLANEOUS) ×2 IMPLANT
COVER MAYO STAND STRL (DRAPES) IMPLANT
COVER SURGICAL LIGHT HANDLE (MISCELLANEOUS) ×2 IMPLANT
DRAPE LAPAROSCOPIC ABDOMINAL (DRAPES) ×2 IMPLANT
DRAPE WARM FLUID 44X44 (DRAPES) IMPLANT
DRSG OPSITE POSTOP 4X8 (GAUZE/BANDAGES/DRESSINGS) ×1 IMPLANT
ELECT REM PT RETURN 15FT ADLT (MISCELLANEOUS) ×2 IMPLANT
GAUZE SPONGE 4X4 12PLY STRL (GAUZE/BANDAGES/DRESSINGS) ×2 IMPLANT
GLOVE SURG ORTHO LTX SZ8 (GLOVE) ×2 IMPLANT
GLOVE SURG SYN 7.5  E (GLOVE) ×2
GLOVE SURG SYN 7.5 E (GLOVE) ×1 IMPLANT
GLOVE SURG SYN 7.5 PF PI (GLOVE) ×1 IMPLANT
GLOVE SURG UNDER POLY LF SZ7 (GLOVE) ×2 IMPLANT
GOWN STRL REUS W/TWL LRG LVL3 (GOWN DISPOSABLE) ×2 IMPLANT
GOWN STRL REUS W/TWL XL LVL3 (GOWN DISPOSABLE) ×4 IMPLANT
HANDLE SUCTION POOLE (INSTRUMENTS) IMPLANT
KIT BASIN OR (CUSTOM PROCEDURE TRAY) ×2 IMPLANT
KIT TURNOVER KIT A (KITS) ×2 IMPLANT
NS IRRIG 1000ML POUR BTL (IV SOLUTION) ×2 IMPLANT
PACK GENERAL/GYN (CUSTOM PROCEDURE TRAY) ×2 IMPLANT
SPONGE T-LAP 18X18 ~~LOC~~+RFID (SPONGE) IMPLANT
STAPLER VISISTAT 35W (STAPLE) ×2 IMPLANT
SUCTION POOLE HANDLE (INSTRUMENTS)
SUT NOV 1 T60/GS (SUTURE) IMPLANT
SUT PDS AB 1 CT1 27 (SUTURE) ×2 IMPLANT
SUT SILK 2 0 (SUTURE)
SUT SILK 2 0 SH CR/8 (SUTURE) IMPLANT
SUT SILK 2-0 18XBRD TIE 12 (SUTURE) IMPLANT
SUT SILK 3 0 (SUTURE)
SUT SILK 3 0 SH CR/8 (SUTURE) IMPLANT
SUT SILK 3-0 18XBRD TIE 12 (SUTURE) IMPLANT
SUT VICRYL 2 0 18  UND BR (SUTURE)
SUT VICRYL 2 0 18 UND BR (SUTURE) IMPLANT
TOWEL OR 17X26 10 PK STRL BLUE (TOWEL DISPOSABLE) ×4 IMPLANT
TRAY FOLEY MTR SLVR 16FR STAT (SET/KITS/TRAYS/PACK) IMPLANT
WATER STERILE IRR 1000ML POUR (IV SOLUTION) ×2 IMPLANT
YANKAUER SUCT BULB TIP NO VENT (SUCTIONS) IMPLANT

## 2021-03-17 NOTE — Anesthesia Procedure Notes (Signed)
Procedure Name: Intubation Date/Time: 03/17/2021 12:34 PM Performed by: Victoriano Lain, CRNA Pre-anesthesia Checklist: Patient identified, Emergency Drugs available, Suction available, Patient being monitored and Timeout performed Patient Re-evaluated:Patient Re-evaluated prior to induction Oxygen Delivery Method: Circle system utilized Preoxygenation: Pre-oxygenation with 100% oxygen Induction Type: IV induction Ventilation: Mask ventilation without difficulty Laryngoscope Size: Mac and 4 Grade View: Grade I Tube type: Oral Tube size: 7.0 mm Number of attempts: 1 Airway Equipment and Method: Stylet Placement Confirmation: ETT inserted through vocal cords under direct vision, positive ETCO2 and breath sounds checked- equal and bilateral Secured at: 21 cm Tube secured with: Tape Dental Injury: Teeth and Oropharynx as per pre-operative assessment

## 2021-03-17 NOTE — Progress Notes (Signed)
Assessment & Plan: HD#2 - small bowel obstruction, potentially closed loop  No improvement with NG decompression, NPO, IV fluid  Persistent nausea, pain  AXR this AM with no improvement  Discussed risk of closed loop obstruction with swirling seen on CT scan with patient and her daughter by telephone this AM.  Mild tenderness on exam.  No return of bowel function.  I have recommended proceeding with exploratory laparotomy and lysis of adhesions today.  Discussed possible bowel resection.  Discussed possibility of recurrent ileus and post op course to be expected.  They understand and agree to proceed.  Will notify OR.        [name], MD       [facility]       Office: [phone]   Chief Complaint: Abdominal pain, SBO  Subjective: Patient in bed, some pain, nausea.  Minimal flatus last night, no BM's.  NG bilious.  Objective: Vital signs in last 24 hours: Temp:  [98.5 F (36.9 C)-98.8 F (37.1 C)] 98.6 F (37 C) (09/30 0300) Pulse Rate:  [100-122] 122 (09/30 0828) Resp:  [16-19] 19 (09/29 2200) BP: (147-183)/(86-91) 159/88 (09/30 0300) SpO2:  [94 %-99 %] 96 % (09/30 0300) Last BM Date: 03/15/21  Intake/Output from previous day: 09/29 0701 - 09/30 0700 In: -  Out: 725 [Emesis/NG output:725] Intake/Output this shift: No intake/output data recorded.  Physical Exam: HEENT - sclerae clear, mucous membranes moist Neck - soft Chest - clear bilaterally Cor - RRR Abdomen - slight distension, mild diffuse tenderness Ext - no edema, non-tender Neuro - alert & oriented, no focal deficits  Lab Results:  Recent Labs    03/16/21 0207 03/17/21 0522  WBC 14.2* 14.1*  HGB 14.6 13.9  HCT 44.1 41.8  PLT 305 273   BMET Recent Labs    03/16/21 0207 03/17/21 0522  NA 139 136  K 3.5 2.6*  CL 102 99  CO2 26 27  GLUCOSE 160* 117*  BUN 16 12  CREATININE 0.63 0.61  CALCIUM 10.2 9.8   PT/INR No results for input(s): LABPROT, INR in the last 72  hours. Comprehensive Metabolic Panel:    Component Value Date/Time   NA 136 03/17/2021 0522   NA 139 03/16/2021 0207   NA 139 02/21/2018 0854   NA 137 02/27/2017 1205   K 2.6 (LL) 03/17/2021 0522   K 3.5 03/16/2021 0207   CL 99 03/17/2021 0522   CL 102 03/16/2021 0207   CO2 27 03/17/2021 0522   CO2 26 03/16/2021 0207   BUN 12 03/17/2021 0522   BUN 16 03/16/2021 0207   BUN 20 02/21/2018 0854   BUN 12 02/27/2017 1205   CREATININE 0.61 03/17/2021 0522   CREATININE 0.63 03/16/2021 0207   CREATININE 0.54 (L) 11/28/2015 1422   CREATININE 0.71 08/19/2015 0905   GLUCOSE 117 (H) 03/17/2021 0522   GLUCOSE 160 (H) 03/16/2021 0207   CALCIUM 9.8 03/17/2021 0522   CALCIUM 10.2 03/16/2021 0207   AST 24 03/15/2021 1925   AST 20 11/24/2020 0320   ALT 21 03/15/2021 1925   ALT 21 11/24/2020 0320   ALKPHOS 113 03/15/2021 1925   ALKPHOS 175 (H) 11/24/2020 0320   BILITOT 1.9 (H) 03/15/2021 1925   BILITOT 0.9 11/24/2020 0320   PROT 7.7 03/15/2021 1925   PROT 5.6 (L) 11/24/2020 0320   ALBUMIN 4.7 03/15/2021 1925   ALBUMIN 2.6 (L) 11/24/2020 0320    Studies/Results: CT ABDOMEN PELVIS WO CONTRAST  Result Date:  03/15/2021 CLINICAL DATA:  Intractable nausea and vomiting EXAM: CT ABDOMEN AND PELVIS WITHOUT CONTRAST TECHNIQUE: Multidetector CT imaging of the abdomen and pelvis was performed following the standard protocol without IV contrast. COMPARISON:  11/23/2020 FINDINGS: Lower chest: The visualized lung bases are clear. The visualized heart and pericardium are unremarkable. Hepatobiliary: No focal liver abnormality is seen. Status post cholecystectomy. No biliary dilatation. Pancreas: Unremarkable Spleen: Unremarkable Adrenals/Urinary Tract: The adrenal glands are unremarkable. The kidneys are normal in size and position. Exophytic 10 cm simple cortical cyst arises from the lower pole the right kidney, unchanged. The kidneys are otherwise unremarkable. The bladder is unremarkable. Stomach/Bowel:  Surgical changes of right hemicolectomy are again identified. There are numerous loops of dilated fluid-filled small bowel involving the mid jejunum with decompressed distal colon in keeping with a small-bowel obstruction. Moreover, there are 2 apparent points of transition with bowel pulled across midline into the right upper quadrant, best seen on axial image # 40/2 and 41/2. Moreover, there is focal narrowing of the superior mesenteric venous branches, best seen on image # 42/2 and whorled tethering of the small bowel, best seen on image # 44/2. Altogether, the findings are suggestive of an internal hernia. There is marked mesenteric edema involving the affected segment of mid small bowel suggestive of a closed loop obstruction. Mild ascites is present. No free intraperitoneal gas. No pneumatosis. Previously noted region of peritoneal fat necrosis anterior and superior to the bladder has resolved. Vascular/Lymphatic: Extensive aortoiliac atherosclerotic calcification. No aortic aneurysm. No pathologic adenopathy within the abdomen and pelvis. Reproductive: Status post hysterectomy. No adnexal masses. Other: No abdominal wall hernia.  Rectum unremarkable. Musculoskeletal: No acute bone abnormality. Osseous structures are diffusely osteopenic. Compression deformities of T12, L2, and L3 are unchanged. No lytic or blastic bone lesion. IMPRESSION: Findings in keeping with a mid small bowel obstruction, possibly representing a closed loop obstruction secondary to internal hernia, as described above. No pneumatosis or free intraperitoneal gas. Extensive mesenteric edema and trace ascites present. Surgical consultation is advised. Resolved area of fat necrosis noted on prior examination. Stable 10 cm simple exophytic cortical cyst arising from the lower pole the right kidney. Aortic Atherosclerosis (ICD10-I70.0). Electronically Signed   By: Fidela Salisbury M.D.   On: 03/15/2021 21:27   DG Abd 1 View  Result Date:  03/17/2021 CLINICAL DATA:  Small bowel obstruction EXAM: ABDOMEN - 1 VIEW COMPARISON:  03/16/2021 FINDINGS: NG tube tip is in the proximal stomach with the side port in the distal esophagus, unchanged. Dilated lower abdominal small bowel loops are unchanged. No free air or organomegaly. Prior cholecystectomy. IMPRESSION: No significant change. Electronically Signed   By: Rolm Baptise M.D.   On: 03/17/2021 07:12   DG Abdomen Acute W/Chest  Result Date: 03/15/2021 CLINICAL DATA:  Vomiting EXAM: DG ABDOMEN ACUTE WITH 1 VIEW CHEST COMPARISON:  CT 11/21/2020, radiograph 11/21/2020, chest x-ray 11/13/2020 FINDINGS: Single-view chest demonstrates no focal opacity or pleural effusion. Normal cardiomediastinal silhouette with aortic atherosclerosis. Questionable nodule in the right mid lung. Supine and upright views of the abdomen demonstrate multiple dilated loops of central small bowel up to 5.2 cm with fluid levels on upright exam and relative paucity of distal gas concerning for a bowel obstruction. Surgical clips in the right upper quadrant. Lucency beneath left diaphragm felt to represent air-fluid level in the stomach. IMPRESSION: 1. No radiographic evidence for acute cardiopulmonary abnormality. Possible right midlung nodule which may be correlated with chest CT 2. Findings concerning for small bowel obstruction  Electronically Signed   By: Donavan Foil M.D.   On: 03/15/2021 20:33   DG Abd Portable 1 View  Result Date: 03/15/2021 CLINICAL DATA:  Nasogastric tube placement EXAM: PORTABLE ABDOMEN - 1 VIEW COMPARISON:  03/15/2021 FINDINGS: Nasogastric tube is been placed with its proximal side hole within the distal esophagus and its tip just within the proximal stomach. Dilated loops of small bowel are seen within the visualized upper abdomen in keeping with underlying small-bowel obstruction. The visualized lung bases are clear. IMPRESSION: Nasogastric tube tip just within the proximal body of the stomach with  the proximal side hole position within the distal esophagus. Advancement of the catheter by 15 cm would more optimally position the catheter Electronically Signed   By: Fidela Salisbury M.D.   On: 03/15/2021 22:45      [name] 03/17/2021   Patient ID: Robin [name], female   DOB: August 19, 1934, 86 y.o.   MRN: [id number]

## 2021-03-17 NOTE — Anesthesia Preprocedure Evaluation (Addendum)
Anesthesia Evaluation  Patient identified by MRN, date of birth, ID band Patient awake    Reviewed: Allergy & Precautions, NPO status , Patient's Chart, lab work & pertinent test results, reviewed documented beta blocker date and time   Airway Mallampati: II  TM Distance: >3 FB Neck ROM: Full    Dental  (+) Teeth Intact, Dental Advisory Given   Pulmonary neg pulmonary ROS,    Pulmonary exam normal breath sounds clear to auscultation       Cardiovascular hypertension, Pt. on home beta blockers and Pt. on medications + Peripheral Vascular Disease  Normal cardiovascular exam+ dysrhythmias Atrial Fibrillation  Rhythm:Irregular Rate:Normal  HLD  TTE 2022 1. Left ventricular ejection fraction, by estimation, is 60 to 65%. The  left ventricle has normal function. The left ventricle has no regional  wall motion abnormalities. Left ventricular diastolic parameters are  indeterminate.  2. Right ventricular systolic function is normal. The right ventricular  size is normal. There is normal pulmonary artery systolic pressure. The  estimated right ventricular systolic pressure is 34.7 mmHg.  3. Right atrial size was mildly dilated.  4. The mitral valve is normal in structure. Trivial mitral valve  regurgitation.  5. The aortic valve was not well visualized. Aortic valve regurgitation  is not visualized. No aortic stenosis is present.  6. The inferior vena cava is normal in size with greater than 50%  respiratory variability, suggesting right atrial pressure of 3 mmHg.   Stress Test 2019 Nuclear stress EF: 81%. The left ventricular ejection fraction is hyperdynamic (>65%). The study is normal. This is a low risk study.    Neuro/Psych PSYCHIATRIC DISORDERS Anxiety negative neurological ROS     GI/Hepatic negative GI ROS, Neg liver ROS,   Endo/Other  negative endocrine ROSK 2.6, on third 90mEq bag of K, repeat K 3.1   Renal/GU negative Renal ROS  negative genitourinary   Musculoskeletal negative musculoskeletal ROS (+)   Abdominal   Peds  Hematology  (+) Blood dyscrasia (on eliquis), ,   Anesthesia Other Findings Ex lap for SBO  Reproductive/Obstetrics                         Anesthesia Physical Anesthesia Plan  ASA: 3  Anesthesia Plan: General   Post-op Pain Management:    Induction: Intravenous  PONV Risk Score and Plan: 3 and Dexamethasone, Ondansetron and Treatment may vary due to age or medical condition  Airway Management Planned: Oral ETT  Additional Equipment:   Intra-op Plan:   Post-operative Plan: Extubation in OR  Informed Consent: I have reviewed the patients History and Physical, chart, labs and discussed the procedure including the risks, benefits and alternatives for the proposed anesthesia with the patient or authorized representative who has indicated his/her understanding and acceptance.     Dental advisory given  Plan Discussed with: CRNA  Anesthesia Plan Comments:         Anesthesia Quick Evaluation

## 2021-03-17 NOTE — Plan of Care (Signed)
  Problem: Education: Goal: Knowledge of General Education information will improve Description: Including pain rating scale, medication(s)/side effects and non-pharmacologic comfort measures Outcome: Progressing   Problem: Clinical Measurements: Goal: Will remain free from infection Outcome: Progressing Goal: Diagnostic test results will improve Outcome: Progressing Goal: Cardiovascular complication will be avoided Outcome: Progressing   Problem: Nutrition: Goal: Adequate nutrition will be maintained Outcome: Progressing   Problem: Coping: Goal: Level of anxiety will decrease Outcome: Progressing   Problem: Elimination: Goal: Will not experience complications related to bowel motility Outcome: Progressing Goal: Will not experience complications related to urinary retention Outcome: Progressing   Problem: Pain Managment: Goal: General experience of comfort will improve Outcome: Progressing   Problem: Safety: Goal: Ability to remain free from injury will improve Outcome: Progressing   Problem: Skin Integrity: Goal: Risk for impaired skin integrity will decrease Outcome: Progressing   

## 2021-03-17 NOTE — Progress Notes (Signed)
  Progress Note    Robin [name]   EEF:[id number]  DOB: 1934/10/27  DOA: 03/15/2021     2 Date of Service: 03/17/2021   Ms. Bir is an 86 yo female with PMH persistent afib (on Eliquis), HTN, mild carotid stenosis who presented to the hospital with complaints of abdominal pain. Earlier this year she was treated for a GI bleed in May associated with localized adenocarcinoma of the ascending colon (s/p right colon resection on 10/31/20).  She also developed nausea and vomiting after admission.  An NG tube was placed and she underwent evaluation with surgery as well as CT abdomen/pelvis. Imaging showed a mid small bowel obstruction, possibly representing a closed-loop obstruction due to internal hernia.  Subjective:  No events overnight.  Still having nausea and had some dry heaving this morning.  Pain unchanged.  Undergoing ex-lap today.   Hospital Problems * SBO (small bowel obstruction) ([place]) - History of abdominal surgery in May 2022.  Would be at risk for further adhesions.  CT showing concern for possible obstruction involving internal hernia - NG tube currently in place - General surgery following, appreciate assistance - patient failed to improve with supportive care and taken to OR on 9/30; underwent successful LOA's - NGT to remain until bowel function returns, then likely clamp trial and slow advancement of diet as per surgery - continuing pain and nausea control as needed  Paroxysmal atrial fibrillation ([place]) - Eliquis currently on hold - Continue metoprolol and Cardizem  Cecal cancer & polyps s/p lap hemicolectomy 10/31/2020 - See obstruction.  At risk for development of adhesions (confirmed during ex-lap)  Hypertension - Continue metoprolol and diltiazem  Objective Vital signs were reviewed and unremarkable.  Vitals:   03/17/21 1400 03/17/21 1410 03/17/21 1415 03/17/21 1423  BP: (!) 181/86  (!) 179/108 (!) 171/93  Pulse: (!) 103 92 99 87  Resp: (!) 23 (!) 22  (!) 21 15  Temp:      TempSrc:      SpO2: 100% 100% 100% 100%  Height:          Exam General appearance: alert, cooperative, and no distress Head: Normocephalic, without obvious abnormality, atraumatic, NG tube in place Eyes:  EOMI Lungs: clear to auscultation bilaterally Heart: regular rate and rhythm and S1, S2 normal Abdomen:  Mild nonspecific tenderness throughout with no rebound or guarding.  Bowel sounds hypoactive Extremities:  Trace lower extremity edema Skin: mobility and turgor normal Neurologic: Grossly normal    Labs / Other Information My review of labs, imaging, notes and other tests shows no new significant findings.    Time spent: Greater than 50% of the 35 minute visit was spent in counseling/coordination of care for the patient as laid out in the A&P.  [name], MD [facility] 03/17/2021, 2:27 PM

## 2021-03-17 NOTE — Progress Notes (Signed)
Consent for procedure obtained and placed in pts chart.

## 2021-03-17 NOTE — Care Management Important Message (Signed)
Medicare IM printed for Social Work at [facility] to give to the patient 

## 2021-03-17 NOTE — Progress Notes (Signed)
Chaplain received a call from Robin [name]'s daughter that her patient was having unexpected surgery today.  She requested prayer for her mother.  Chaplain prayed with daughter, [name], after her mom was taken to surgery and provided a prayer of gratitude when surgery was complete and Reino Bellis had returned to her room.  [place], Bcc Pager, [phone] 3:52 PM

## 2021-03-17 NOTE — Op Note (Signed)
Operative Note  Pre-operative Diagnosis:  small bowel obstruction  Post-operative Diagnosis:  same  Surgeon:  [name], MD  Assistant:  none   Procedure:  exploratory laparotomy, lysis of adhesions  Anesthesia:  general  Estimated Blood Loss:  minimal  Drains: none         Specimen: none  Indications: Patient is a 86 year old female with a history of right colectomy in May 2022.  Patient was admitted 48 hours ago with signs and symptoms of small bowel obstruction.  CT scan of the abdomen and pelvis raise the possibility of a closed-loop obstruction.  Patient was treated with nasogastric decompression and intravenous hydration.  She failed to improve.  She is now prepared and brought to the operating room for laparotomy.  Procedure:  The patient was seen in the pre-op holding area. The risks, benefits, complications, treatment options, and expected outcomes were previously discussed with the patient. The patient agreed with the proposed plan and has signed the informed consent form.  The patient was brought to the operating room by the surgical team, identified as [place] and the procedure verified. A "time out" was completed and the above information confirmed.  Following administration of general anesthesia, the patient was positioned and then prepped and draped in usual aseptic fashion.  After ascertaining that an adequate level of anesthesia been achieved, a midline abdominal incision is made with a #15 blade.  Dissection is carried through subcutaneous tissues using the electrocautery for hemostasis.  Fascia was incised in the midline and the peritoneal cavity was entered cautiously.  There are a few markedly dilated loops of small bowel.  Exploration reveals bandlike adhesions.  There are 2 dense fibrous bands which traverse the small bowel causing obstruction.  Each of these bands is lysed and then the majority of each band is excised.  There is also a band of omentum  stretching from the left upper quadrant to the right lower quadrant which is lysed from the right lower quadrant and then the segment of omentum is resected between Kelly clamps and ligated with 2-0 silk ties.  The small bowel was then run from the ligament of Treitz to the ileocolonic anastomosis in the distal transverse colon.  Adhesions are lysed sharply with the Metzenbaum scissors.  There is no other point of obstruction noted.  Exploration of the abdomen reveals a large exophytic cyst on the inferior right kidney which was known from previous CT scan and was stable.  No other abnormal findings were noted within the abdomen.  Nasogastric tube is properly positioned within the stomach.  Viscera are returned to the peritoneal cavity and oriented properly.  There is no significant remaining omentum.  Midline incision is closed with a running #1 PDS suture.  Subcutaneous tissues are irrigated.  Skin is closed with stainless steel staples.  Honeycomb dressing is placed.  Patient is awakened from anesthesia and transported to the recovery room in stable condition.  The patient tolerated the procedure well.   [name], [name] Office: [phone]

## 2021-03-17 NOTE — Progress Notes (Addendum)
   03/17/21 1134  Mobility  Activity Off unit   Pt off unit currently for surgery, hold mobility.   [facility] Specialist Acute Rehab Services Office: [phone]

## 2021-03-17 NOTE — Anesthesia Postprocedure Evaluation (Signed)
Anesthesia Post Note  Patient: Robin [name]  Procedure(s) Performed: EXPLORATORY LAPAROTOMY/LYSIS OF ADHESIONS (Abdomen)     Patient location during evaluation: PACU Anesthesia Type: General Level of consciousness: awake and alert Pain management: pain level controlled Vital Signs Assessment: post-procedure vital signs reviewed and stable Respiratory status: spontaneous breathing, nonlabored ventilation, respiratory function stable and patient connected to nasal cannula oxygen Cardiovascular status: blood pressure returned to baseline and stable Postop Assessment: no apparent nausea or vomiting Anesthetic complications: no   No notable events documented.  Last Vitals:  Vitals:   03/17/21 1445 03/17/21 1500  BP: (!) 169/80 (!) 177/89  Pulse: (!) 104 93  Resp: 18 (!) 21  Temp: 36.6 C   SpO2: 100% 100%    Last Pain:  Vitals:   03/17/21 1500  TempSrc:   PainSc: 3                  Debby Clyne L Jeffey Janssen

## 2021-03-17 NOTE — Progress Notes (Signed)
Placed patient on bedpan to void.  But states she could not go.

## 2021-03-17 NOTE — Transfer of Care (Signed)
Immediate Anesthesia Transfer of Care Note  Patient: Ardyce E Seat  Procedure(s) Performed: EXPLORATORY LAPAROTOMY/LYSIS OF ADHESIONS (Abdomen)  Patient Location: PACU  Anesthesia Type:General  Level of Consciousness: awake, alert , oriented and patient cooperative  Airway & Oxygen Therapy: Patient Spontanous Breathing and Patient connected to face mask oxygen  Post-op Assessment: Report given to RN, Post -op Vital signs reviewed and stable and Patient moving all extremities  Post vital signs: Reviewed and stable  Last Vitals:  Vitals Value Taken Time  BP 181/86 03/17/21 1400  Temp    Pulse 92 03/17/21 1401  Resp 20 03/17/21 1401  SpO2 100 % 03/17/21 1401  Vitals shown include unvalidated device data.  Last Pain:  Vitals:   03/17/21 1119  TempSrc:   PainSc: 0-No pain      Patients Stated Pain Goal: 3 (79/02/40 9735)  Complications: No notable events documented.

## 2021-03-18 ENCOUNTER — Encounter (HOSPITAL_COMMUNITY): Payer: Self-pay | Admitting: Surgery

## 2021-03-18 LAB — CBC WITH DIFFERENTIAL/PLATELET
Abs Immature Granulocytes: 0.11 10*3/uL — ABNORMAL HIGH (ref 0.00–0.07)
Basophils Absolute: 0 10*3/uL (ref 0.0–0.1)
Basophils Relative: 0 %
Eosinophils Absolute: 0 10*3/uL (ref 0.0–0.5)
Eosinophils Relative: 0 %
HCT: 42.6 % (ref 36.0–46.0)
Hemoglobin: 14.3 g/dL (ref 12.0–15.0)
Immature Granulocytes: 1 %
Lymphocytes Relative: 6 %
Lymphs Abs: 1.1 10*3/uL (ref 0.7–4.0)
MCH: 29 pg (ref 26.0–34.0)
MCHC: 33.6 g/dL (ref 30.0–36.0)
MCV: 86.4 fL (ref 80.0–100.0)
Monocytes Absolute: 1.4 10*3/uL — ABNORMAL HIGH (ref 0.1–1.0)
Monocytes Relative: 8 %
Neutro Abs: 15.7 10*3/uL — ABNORMAL HIGH (ref 1.7–7.7)
Neutrophils Relative %: 85 %
Platelets: 279 10*3/uL (ref 150–400)
RBC: 4.93 MIL/uL (ref 3.87–5.11)
RDW: 13.8 % (ref 11.5–15.5)
WBC: 18.2 10*3/uL — ABNORMAL HIGH (ref 4.0–10.5)
nRBC: 0 % (ref 0.0–0.2)

## 2021-03-18 LAB — BASIC METABOLIC PANEL
Anion gap: 5 (ref 5–15)
BUN: 16 mg/dL (ref 8–23)
CO2: 29 mmol/L (ref 22–32)
Calcium: 9.4 mg/dL (ref 8.9–10.3)
Chloride: 103 mmol/L (ref 98–111)
Creatinine, Ser: 0.59 mg/dL (ref 0.44–1.00)
GFR, Estimated: >60 mL/min (ref >60)
Glucose, Bld: 177 mg/dL — ABNORMAL HIGH (ref 70–99)
Potassium: 4.2 mmol/L (ref 3.5–5.1)
Sodium: 137 mmol/L (ref 135–145)

## 2021-03-18 LAB — MAGNESIUM: Magnesium: 2.2 mg/dL (ref 1.7–2.4)

## 2021-03-18 MED ORDER — METOPROLOL TARTRATE 5 MG/5ML IV SOLN
5.0000 mg | Freq: Two times a day (BID) | INTRAVENOUS | Status: DC
  Administered 2021-03-18 – 2021-03-22 (×9): 5 mg via INTRAVENOUS
  Filled 2021-03-18 (×9): qty 5

## 2021-03-18 NOTE — Progress Notes (Signed)
Foley removed. Pt tolerated well. Peri care provided post removal. Pt able to void on BSC after removal.  Pt ambulated hallway and tolerated well. Pt in chair at this time. Call bell in reach. Chair alarm on. Will continue to monitor,

## 2021-03-18 NOTE — Progress Notes (Signed)
1 Day Post-Op   Subjective/Chief Complaint: Sore Patient sitting up in bed.  Awake and alert.  Sore but in no distress   Objective: Vital signs in last 24 hours: Temp:  [97.4 F (36.3 C)-98.7 F (37.1 C)] 97.5 F (36.4 C) (10/01 0423) Pulse Rate:  [51-104] 96 (10/01 0423) Resp:  [13-27] 17 (10/01 0818) BP: (153-181)/(78-110) 155/89 (10/01 0423) SpO2:  [97 %-100 %] 98 % (10/01 0818) FiO2 (%):  [0 %] 0 % (09/30 1633) Last BM Date: 03/15/21  Intake/Output from previous day: 09/30 0701 - 10/01 0700 In: 2146.4 [I.V.:1829.3; IV Piggyback:317.1] Out: 925 [Urine:700; Emesis/NG output:150; Blood:75] Intake/Output this shift: No intake/output data recorded.  General appearance: alert and cooperative Resp: clear to auscultation bilaterally Cardio: regular rate and rhythm Incision/Wound: Honeycomb dressing in place.  Soft nontender with some mild distention.  Appropriately sore around incision.  Lab Results:  Recent Labs    03/17/21 0522 03/17/21 1122 03/17/21 1313 03/18/21 0552  WBC 14.1*  --   --  18.2*  HGB 13.9   < > 11.9* 14.3  HCT 41.8   < > 35.0* 42.6  PLT 273  --   --  279   < > = values in this interval not displayed.   BMET Recent Labs    03/17/21 0522 03/17/21 1122 03/17/21 1313 03/18/21 0552  NA 136 136 136 137  K 2.6* 3.1* 3.1* 4.2  CL 99 96*  --  103  CO2 27  --   --  29  GLUCOSE 117* 124*  --  177*  BUN 12 12  --  16  CREATININE 0.61 0.50  --  0.59  CALCIUM 9.8  --   --  9.4   PT/INR No results for input(s): LABPROT, INR in the last 72 hours. ABG Recent Labs    03/17/21 1313  HCO3 24.5    Studies/Results: DG Abd 1 View  Result Date: 03/17/2021 CLINICAL DATA:  Small bowel obstruction EXAM: ABDOMEN - 1 VIEW COMPARISON:  03/16/2021 FINDINGS: NG tube tip is in the proximal stomach with the side port in the distal esophagus, unchanged. Dilated lower abdominal small bowel loops are unchanged. No free air or organomegaly. Prior cholecystectomy.  IMPRESSION: No significant change. Electronically Signed   By: Rolm Baptise M.D.   On: 03/17/2021 07:12    Anti-infectives: Anti-infectives (From admission, onward)    Start     Dose/Rate Route Frequency Ordered Stop   03/17/21 1200  ertapenem (INVANZ) 1,000 mg in sodium chloride 0.9 % 100 mL IVPB        1 g 200 mL/hr over 30 Minutes Intravenous On call to O.R. 03/17/21 0936 03/17/21 1307       Assessment/Plan: s/p Procedure(s): EXPLORATORY LAPAROTOMY/LYSIS OF ADHESIONS (N/A) Out of bed  DC Foley  Ambulate if able  Continue supportive care until bowel function returns  LOS: 3 days    Marcello Moores A Swannie Milius 03/18/2021

## 2021-03-18 NOTE — Progress Notes (Signed)
  Progress Note    Robin [name]   FUX:[id number]  DOB: Feb 21, 1935  DOA: 03/15/2021     3 Date of Service: 03/18/2021   [name] is an 86 yo female with PMH persistent afib (on Eliquis), HTN, mild carotid stenosis who presented to the hospital with complaints of abdominal pain. Earlier this year she was treated for a GI bleed in May associated with localized adenocarcinoma of the ascending colon (s/p right colon resection on 10/31/20).  She also developed nausea and vomiting after admission.  An NG tube was placed and she underwent evaluation with surgery as well as CT abdomen/pelvis. Imaging showed a mid small bowel obstruction, possibly representing a closed-loop obstruction due to internal hernia. She underwent LOA on 03/17/21.   Subjective:  No events overnight.  Bowel sounds are slightly improved today compared to yesterday.  Denies any nausea or vomiting since surgery.    Hospital Problems * SBO (small bowel obstruction) ([facility]) - History of abdominal surgery in May 2022.  Would be at risk for further adhesions.  CT showing concern for possible obstruction involving internal hernia - NG tube currently in place - General surgery following, appreciate assistance - patient failed to improve with supportive care and taken to OR on 9/30; underwent successful LOA's - NGT to remain until bowel function returns, then likely clamp trial and slow advancement of diet as per surgery - continuing pain and nausea control as needed - bowel sounds are improved on 10/1  Paroxysmal atrial fibrillation ([facility]) - Eliquis currently on hold; if no return of bowel function soon will consider heparin drip by tomorrow - oral lopressor and cardizem on hold - use IV lopressor for now  Cecal cancer & polyps s/p lap hemicolectomy 10/31/2020 - See obstruction.  At risk for development of adhesions (confirmed during ex-lap)  Hypertension - see afib  Objective Vital signs were reviewed and unremarkable.   Vitals:   03/18/21 0048 03/18/21 0423 03/18/21 0818 03/18/21 1207  BP:  (!) 155/89    Pulse:  96    Resp: 14 16 17 20   Temp:  (!) 97.5 F (36.4 C)    TempSrc:  Oral    SpO2: 98% 100% 98% 95%  Height:          Exam General appearance: alert, cooperative, and no distress Head: Normocephalic, without obvious abnormality, atraumatic, NG tube in place Eyes:  EOMI Lungs: clear to auscultation bilaterally Heart: regular rate and rhythm and S1, S2 normal Abdomen:  Less tender.  Bowel sounds more present throughout.  Soft, nondistended Extremities:  Trace lower extremity edema Skin: mobility and turgor normal Neurologic: Grossly normal    Labs / Other Information My review of labs, imaging, notes and other tests shows no new significant findings.    Time spent: Greater than 50% of the 35 minute visit was spent in counseling/coordination of care for the patient as laid out in the A&P.  [name], MD [facility] 03/18/2021, 12:45 PM

## 2021-03-19 LAB — CBC WITH DIFFERENTIAL/PLATELET
Abs Immature Granulocytes: 0.06 10*3/uL (ref 0.00–0.07)
Basophils Absolute: 0 10*3/uL (ref 0.0–0.1)
Basophils Relative: 0 %
Eosinophils Absolute: 0.1 10*3/uL (ref 0.0–0.5)
Eosinophils Relative: 1 %
HCT: 42 % (ref 36.0–46.0)
Hemoglobin: 13.7 g/dL (ref 12.0–15.0)
Immature Granulocytes: 0 %
Lymphocytes Relative: 14 %
Lymphs Abs: 1.9 10*3/uL (ref 0.7–4.0)
MCH: 29 pg (ref 26.0–34.0)
MCHC: 32.6 g/dL (ref 30.0–36.0)
MCV: 88.8 fL (ref 80.0–100.0)
Monocytes Absolute: 0.9 10*3/uL (ref 0.1–1.0)
Monocytes Relative: 7 %
Neutro Abs: 10.6 10*3/uL — ABNORMAL HIGH (ref 1.7–7.7)
Neutrophils Relative %: 78 %
Platelets: 251 10*3/uL (ref 150–400)
RBC: 4.73 MIL/uL (ref 3.87–5.11)
RDW: 14 % (ref 11.5–15.5)
WBC: 13.6 10*3/uL — ABNORMAL HIGH (ref 4.0–10.5)
nRBC: 0 % (ref 0.0–0.2)

## 2021-03-19 LAB — BASIC METABOLIC PANEL
Anion gap: 9 (ref 5–15)
BUN: 12 mg/dL (ref 8–23)
CO2: 25 mmol/L (ref 22–32)
Calcium: 8.9 mg/dL (ref 8.9–10.3)
Chloride: 101 mmol/L (ref 98–111)
Creatinine, Ser: 0.39 mg/dL — ABNORMAL LOW (ref 0.44–1.00)
GFR, Estimated: >60 mL/min (ref >60)
Glucose, Bld: 122 mg/dL — ABNORMAL HIGH (ref 70–99)
Potassium: 4.1 mmol/L (ref 3.5–5.1)
Sodium: 135 mmol/L (ref 135–145)

## 2021-03-19 LAB — MAGNESIUM: Magnesium: 2 mg/dL (ref 1.7–2.4)

## 2021-03-19 NOTE — Progress Notes (Signed)
2 Days Post-Op   Subjective/Chief Complaint: feels ok  Pt without flatus or BM    Objective: Vital signs in last 24 hours: Temp:  [97.5 F (36.4 C)-98.7 F (37.1 C)] 97.9 F (36.6 C) (10/02 0946) Pulse Rate:  [78-101] 78 (10/02 0946) Resp:  [16-20] 20 (10/02 0946) BP: (143-169)/(70-86) 154/86 (10/02 0946) SpO2:  [93 %-100 %] 100 % (10/02 0941) Last BM Date: 03/15/21  Intake/Output from previous day: 10/01 0701 - 10/02 0700 In: 936.8 [I.V.:906.8; NG/GT:30] Out: 200 [Emesis/NG output:200] Intake/Output this shift: No intake/output data recorded.  General appearance: alert and cooperative Incision/Wound:wound CDI soft NO DRAINAGE   Lab Results:  Recent Labs    03/18/21 0552 03/19/21 0537  WBC 18.2* 13.6*  HGB 14.3 13.7  HCT 42.6 42.0  PLT 279 251   BMET Recent Labs    03/18/21 0552 03/19/21 0537  NA 137 135  K 4.2 4.1  CL 103 101  CO2 29 25  GLUCOSE 177* 122*  BUN 16 12  CREATININE 0.59 0.39*  CALCIUM 9.4 8.9   PT/INR No results for input(s): LABPROT, INR in the last 72 hours. ABG Recent Labs    03/17/21 1313  HCO3 24.5    Studies/Results: No results found.  Anti-infectives: Anti-infectives (From admission, onward)    Start     Dose/Rate Route Frequency Ordered Stop   03/17/21 1200  ertapenem (INVANZ) 1,000 mg in sodium chloride 0.9 % 100 mL IVPB        1 g 200 mL/hr over 30 Minutes Intravenous On call to O.R. 03/17/21 0936 03/17/21 1307       Assessment/Plan: s/p Procedure(s): EXPLORATORY LAPAROTOMY/LYSIS OF ADHESIONS (N/A) CLAMP NGT  AMBULATE IN HALL   LOS: 4 days    Turner Daniels MD 03/19/2021

## 2021-03-19 NOTE — Progress Notes (Signed)
  Progress Note    Robin [name]   ERX:[id number]  DOB: 02-03-1935  DOA: 03/15/2021     4 Date of Service: 03/19/2021   Robin [name] is an 86 yo female with PMH persistent afib (on Eliquis), HTN, mild carotid stenosis who presented to the hospital with complaints of abdominal pain. Earlier this year she was treated for a GI bleed in May associated with localized adenocarcinoma of the ascending colon (s/p right colon resection on 10/31/20).  She also developed nausea and vomiting after admission.  An NG tube was placed and she underwent evaluation with surgery as well as CT abdomen/pelvis. Imaging showed a mid small bowel obstruction, possibly representing a closed-loop obstruction due to internal hernia. She underwent LOA on 03/17/21.   Subjective:  No events overnight.  She feels some rumbling in her stomach but denies any actual flatus or bowel movements.  Starting to have some hunger pain and denies any nausea or vomiting.  She was able to ambulate in the hall yesterday easily also without developing any nausea while NG was clamped.    Hospital Problems * SBO (small bowel obstruction) ([facility]) - History of abdominal surgery in May 2022.  Would be at risk for further adhesions.  CT showing concern for possible obstruction involving internal hernia - NG tube currently in place - General surgery following, appreciate assistance - patient failed to improve with supportive care and taken to OR on 9/30; underwent successful LOA's - she feels "rumbling" but still denies actual flatus or BM; undergoing clamp trial today per surgery; NT output was ~200 cc in past 24 hrs  Paroxysmal atrial fibrillation ([place]) - Eliquis currently on hold; if no return of bowel function soon will consider heparin drip soon (undergoing clamp trial today so might start liquids tomorrow) - oral lopressor and cardizem on hold - use IV lopressor for now  Cecal cancer & polyps s/p lap hemicolectomy 10/31/2020 - See  obstruction.  At risk for development of adhesions (confirmed during ex-lap)  Hypertension - see afib  Objective Vital signs were reviewed and unremarkable.  Vitals:   03/19/21 0427 03/19/21 0429 03/19/21 0941 03/19/21 0946  BP:  (!) 169/78  (!) 154/86  Pulse:  87  78  Resp: 16 16 17 20   Temp:  98.7 F (37.1 C)  97.9 F (36.6 C)  TempSrc:  Oral  Oral  SpO2: 98% 100% 100%   Height:          Exam General appearance: alert, cooperative, and no distress Head: Normocephalic, without obvious abnormality, atraumatic, NG tube in place Eyes:  EOMI Lungs: clear to auscultation bilaterally Heart: regular rate and rhythm and S1, S2 normal Abdomen:  Less tender.  Bowel sounds more present throughout.  Soft, nondistended; staples and surgical dressing noted midline Extremities:  Trace lower extremity edema Skin: mobility and turgor normal Neurologic: Grossly normal    Labs / Other Information My review of labs, imaging, notes and other tests shows no new significant findings.    Time spent: Greater than 50% of the 35 minute visit was spent in counseling/coordination of care for the patient as laid out in the A&P.  [name], MD [facility] 03/19/2021, 12:15 PM

## 2021-03-20 DIAGNOSIS — C180 Malignant neoplasm of cecum: Secondary | ICD-10-CM | POA: Diagnosis not present

## 2021-03-20 DIAGNOSIS — K56609 Unspecified intestinal obstruction, unspecified as to partial versus complete obstruction: Secondary | ICD-10-CM | POA: Diagnosis not present

## 2021-03-20 DIAGNOSIS — I480 Paroxysmal atrial fibrillation: Secondary | ICD-10-CM | POA: Diagnosis not present

## 2021-03-20 DIAGNOSIS — I10 Essential (primary) hypertension: Secondary | ICD-10-CM | POA: Diagnosis not present

## 2021-03-20 LAB — BASIC METABOLIC PANEL
Anion gap: 4 — ABNORMAL LOW (ref 5–15)
BUN: 6 mg/dL — ABNORMAL LOW (ref 8–23)
CO2: 26 mmol/L (ref 22–32)
Calcium: 8.9 mg/dL (ref 8.9–10.3)
Chloride: 101 mmol/L (ref 98–111)
Creatinine, Ser: 0.35 mg/dL — ABNORMAL LOW (ref 0.44–1.00)
GFR, Estimated: >60 mL/min (ref >60)
Glucose, Bld: 131 mg/dL — ABNORMAL HIGH (ref 70–99)
Potassium: 3.7 mmol/L (ref 3.5–5.1)
Sodium: 131 mmol/L — ABNORMAL LOW (ref 135–145)

## 2021-03-20 LAB — CBC WITH DIFFERENTIAL/PLATELET
Abs Immature Granulocytes: 0.06 10*3/uL (ref 0.00–0.07)
Basophils Absolute: 0 10*3/uL (ref 0.0–0.1)
Basophils Relative: 0 %
Eosinophils Absolute: 0.2 10*3/uL (ref 0.0–0.5)
Eosinophils Relative: 2 %
HCT: 43.4 % (ref 36.0–46.0)
Hemoglobin: 14.2 g/dL (ref 12.0–15.0)
Immature Granulocytes: 1 %
Lymphocytes Relative: 14 %
Lymphs Abs: 1.5 10*3/uL (ref 0.7–4.0)
MCH: 28.6 pg (ref 26.0–34.0)
MCHC: 32.7 g/dL (ref 30.0–36.0)
MCV: 87.5 fL (ref 80.0–100.0)
Monocytes Absolute: 0.7 10*3/uL (ref 0.1–1.0)
Monocytes Relative: 6 %
Neutro Abs: 8.5 10*3/uL — ABNORMAL HIGH (ref 1.7–7.7)
Neutrophils Relative %: 77 %
Platelets: 235 10*3/uL (ref 150–400)
RBC: 4.96 MIL/uL (ref 3.87–5.11)
RDW: 13.9 % (ref 11.5–15.5)
WBC: 11 10*3/uL — ABNORMAL HIGH (ref 4.0–10.5)
nRBC: 0 % (ref 0.0–0.2)

## 2021-03-20 LAB — MAGNESIUM: Magnesium: 1.9 mg/dL (ref 1.7–2.4)

## 2021-03-20 MED ORDER — CALCIUM POLYCARBOPHIL 625 MG PO TABS
625.0000 mg | ORAL_TABLET | Freq: Two times a day (BID) | ORAL | Status: DC
  Administered 2021-03-20: 625 mg via ORAL
  Filled 2021-03-20 (×3): qty 1

## 2021-03-20 MED ORDER — ONDANSETRON HCL 4 MG/2ML IJ SOLN: 4.0000 mg | Freq: Four times a day (QID) | INTRAMUSCULAR | Status: DC | PRN

## 2021-03-20 MED ORDER — MAGIC MOUTHWASH
10.0000 mL | Freq: Three times a day (TID) | ORAL | Status: AC
  Administered 2021-03-20 – 2021-03-22 (×4): 10 mL via ORAL
  Filled 2021-03-20 (×9): qty 10

## 2021-03-20 MED ORDER — HYDROMORPHONE HCL 1 MG/ML IJ SOLN: 0.5000 mg | INTRAMUSCULAR | Status: DC | PRN

## 2021-03-20 MED ORDER — ACETAMINOPHEN 500 MG PO TABS
1000.0000 mg | ORAL_TABLET | Freq: Four times a day (QID) | ORAL | Status: DC
  Filled 2021-03-20 (×2): qty 2

## 2021-03-20 MED ORDER — SIMETHICONE 40 MG/0.6ML PO SUSP
80.0000 mg | Freq: Four times a day (QID) | ORAL | Status: DC | PRN
  Filled 2021-03-20: qty 1.2

## 2021-03-20 MED ORDER — LACTATED RINGERS IV SOLN: INTRAVENOUS | Status: DC

## 2021-03-20 MED ORDER — LIP MEDEX EX OINT
1.0000 "application " | TOPICAL_OINTMENT | Freq: Two times a day (BID) | CUTANEOUS | Status: DC
  Administered 2021-03-20 – 2021-03-28 (×15): 1 via TOPICAL
  Filled 2021-03-20 (×5): qty 7

## 2021-03-20 MED ORDER — MENTHOL 3 MG MT LOZG
1.0000 | LOZENGE | OROMUCOSAL | Status: DC | PRN
  Filled 2021-03-20: qty 9

## 2021-03-20 MED ORDER — PHENOL 1.4 % MT LIQD: 2.0000 | OROMUCOSAL | Status: DC | PRN

## 2021-03-20 MED ORDER — METHOCARBAMOL 1000 MG/10ML IJ SOLN
1000.0000 mg | Freq: Four times a day (QID) | INTRAVENOUS | Status: DC | PRN
  Filled 2021-03-20: qty 10

## 2021-03-20 MED ORDER — LACTATED RINGERS IV BOLUS: 1000.0000 mL | Freq: Three times a day (TID) | INTRAVENOUS | Status: DC | PRN

## 2021-03-20 MED ORDER — ALUM & MAG HYDROXIDE-SIMETH 200-200-20 MG/5ML PO SUSP: 30.0000 mL | Freq: Four times a day (QID) | ORAL | Status: DC | PRN

## 2021-03-20 MED ORDER — HYDROMORPHONE HCL 1 MG/ML IJ SOLN
0.5000 mg | INTRAMUSCULAR | Status: DC | PRN
  Administered 2021-03-20: 1 mg via INTRAVENOUS
  Filled 2021-03-20 (×2): qty 1

## 2021-03-20 NOTE — Progress Notes (Signed)
PROGRESS NOTE    Robin [name]  [id number]:[id number] DOB: June 10, 1935 DOA: 03/15/2021 PCP: [name], PA-C    Brief Narrative:  Robin [name] was admitted to the hospital with the working diagnosis of small bowel obstruction due to adhesions Now sp exploratory laparotomy with lysis of adhesions.   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  She underwent hemicolectomy with complete resection of stage I colon carcinoma.  She had a prolonged hospitalization, postoperative ileus requiring TPN. This time patient reported recurrent nausea and vomiting along with abdominal pain.  Her blood pressure was 182/96, heart rate 112, respiratory rate 18, oxygen saturation 98%, her lungs were clear to auscultation bilaterally, heart S1-S2, present, rhythmic, soft abdomen, tender to palpation diffusely, no lower extremity edema.  Sodium 138, potassium 3.4, chloride 98, bicarb 27, glucose 129, BUN 18, creatinine 0.71, lactic acid 1.8, white count 9.4, hemoglobin 16.0, hematocrit 48.7, platelets 315.  SARS COVID-19 negative.  Chest radiograph with hyperinflation, tenting right hemidiaphragm.  No infiltrates.  Positive air-fluid levels in the abdomen.  CT of the abdomen pelvis with small bowel obstruction possibly representing a closed-loop obstruction secondary to internal hernia.  Extensive mesenteric edema and trace ascites.  EKG 101 bpm,, normal axis, normal QTC, atrial fibrillation rhythm, no significant ST segment or T wave changes, positive PVCs.  Patient received supportive medical therapy including nasogastric tube for intermittent suction. Patient failed to improve supportive care, underwent intraoperative lysis of adhesions 9/30. Developed postoperative ileus.  Assessment & Plan:   Principal Problem:   SBO (small bowel obstruction) ([facility]) Active Problems:   Paroxysmal atrial fibrillation ([facility])    Cecal cancer & polyps s/p lap hemicolectomy 10/31/2020   Hypertension   Small bowel obstruction due to adhesions, now sp exploratory laparotomy and lysis of adhesions. Post operative ileus.  Patient continue with NG tube to intermittent suction, no nausea or vomiting, no flatus or bowel movement. She is out of bed to chair.   Plan to continue supportive medical care with IV fluids, antiacids and as needed antiemetics/ analgesics.  Out of bed to chair tid, PT and OT evaluation.   2. Paroxysmal atrial fibrillation. Heart rate has remained well control. Not able to take oral AV blockade due to NPO status. Will add as needed metoprolol IV for heart rate more than 130 bpm. Continue supportive medical care and hold on anticoagulation.  At home on apixaban.   3. HTN. Continue blood pressure monitoring  4. Cecal cancer sp hemicolectomy in 10/2020. Patient to follow up as outpatient.   5. Stage 1 sacrum pressure ulcer. Not present on admission.   Patient continue to be at high risk for worsening ileus.   Status is: Inpatient  Remains inpatient appropriate because:Inpatient level of care appropriate due to severity of illness  Dispo: The patient is from: Home              Anticipated d/c is to: Home              Patient currently is not medically stable to d/c.   Difficult to place patient No   DVT prophylaxis: Enoxaparin   Code Status:    full  Family Communication:  I spoke with patient's daughter at the bedside, we talked in detail about patient's condition, plan of care and prognosis and all questions were addressed.      Nutrition Status:           Skin Documentation: Pressure  Injury 11/14/20 Buttocks Right Stage 2 -  Partial thickness loss of dermis presenting as a shallow open injury with a red, pink wound bed without slough. (Active)  11/14/20 2230  Location: Buttocks  Location Orientation: Right  Staging: Stage 2 -  Partial thickness loss of dermis presenting as a  shallow open injury with a red, pink wound bed without slough.  Wound Description (Comments):   Present on Admission:      Pressure Injury 03/19/21 Sacrum Medial Stage 1 -  Intact skin with non-blanchable redness of a localized area usually over a bony prominence. Stage 1 over coccyx (Active)  03/19/21 0900  Location: Sacrum  Location Orientation: Medial  Staging: Stage 1 -  Intact skin with non-blanchable redness of a localized area usually over a bony prominence.  Wound Description (Comments): Stage 1 over coccyx  Present on Admission: No     Consultants:  Surgery   Procedures:  Exploratory laparotomy with lysis of adhesions.     Subjective: Patient with persistent abdominal distention and pain, no nausea or vomiting, has NG tube in place.   Objective: Vitals:   03/20/21 0405 03/20/21 0454 03/20/21 0845 03/20/21 1017  BP:  140/79  (!) 152/88  Pulse:  (!) 101  93  Resp: 13 16 17 16   Temp:  98.2 F (36.8 C)  98.3 F (36.8 C)  TempSrc:  Oral  Oral  SpO2: 100% 100% 99% 100%  Height:        Intake/Output Summary (Last 24 hours) at 03/20/2021 1316 Last data filed at 03/20/2021 1200 Gross per 24 hour  Intake 0 ml  Output 575 ml  Net -575 ml   There were no vitals filed for this visit.  Examination:   General: Not in pain or dyspnea, deconditioned  Neurology: Awake and alert, non focal  E ENT: mild pallor, no icterus, oral mucosa moist. NG tube in place.  Cardiovascular: No JVD. S1-S2 present, rhythmic, no gallops, rubs, or murmurs. No lower extremity edema. Pulmonary: vesicular breath sounds bilaterally, adequate air movement, no wheezing, rhonchi or rales. Gastrointestinal. Abdomen distended, not tender to superficial palpation, surgical wound with dressing in place Skin. No rashes Musculoskeletal: no joint deformities     Data Reviewed: I have personally reviewed following labs and imaging studies  CBC: Recent Labs  Lab 03/15/21 1925 03/16/21 0207  03/17/21 0522 03/17/21 1122 03/17/21 1313 03/18/21 0552 03/19/21 0537 03/20/21 0445  WBC 9.4 14.2* 14.1*  --   --  18.2* 13.6* 11.0*  NEUTROABS 5.8  --  11.3*  --   --  15.7* 10.6* 8.5*  HGB 16.0* 14.6 13.9 14.3 11.9* 14.3 13.7 14.2  HCT 48.7* 44.1 41.8 42.0 35.0* 42.6 42.0 43.4  MCV 87.4 87.0 86.9  --   --  86.4 88.8 87.5  PLT 315 305 273  --   --  279 251 025   Basic Metabolic Panel: Recent Labs  Lab 03/16/21 0207 03/17/21 0522 03/17/21 1122 03/17/21 1313 03/18/21 0552 03/19/21 0537 03/20/21 0445  NA 139 136 136 136 137 135 131*  K 3.5 2.6* 3.1* 3.1* 4.2 4.1 3.7  CL 102 99 96*  --  103 101 101  CO2 26 27  --   --  29 25 26   GLUCOSE 160* 117* 124*  --  177* 122* 131*  BUN 16 12 12   --  16 12 6*  CREATININE 0.63 0.61 0.50  --  0.59 0.39* 0.35*  CALCIUM 10.2 9.8  --   --  9.4 8.9 8.9  MG  --  1.6*  --   --  2.2 2.0 1.9   GFR: CrCl cannot be calculated (Unknown ideal weight.). Liver Function Tests: Recent Labs  Lab 03/15/21 1925  AST 24  ALT 21  ALKPHOS 113  BILITOT 1.9*  PROT 7.7  ALBUMIN 4.7   No results for input(s): LIPASE, AMYLASE in the last 168 hours. No results for input(s): AMMONIA in the last 168 hours. Coagulation Profile: No results for input(s): INR, PROTIME in the last 168 hours. Cardiac Enzymes: No results for input(s): CKTOTAL, CKMB, CKMBINDEX, TROPONINI in the last 168 hours. BNP (last 3 results) No results for input(s): PROBNP in the last 8760 hours. HbA1C: No results for input(s): HGBA1C in the last 72 hours. CBG: No results for input(s): GLUCAP in the last 168 hours. Lipid Profile: No results for input(s): CHOL, HDL, LDLCALC, TRIG, CHOLHDL, LDLDIRECT in the last 72 hours. Thyroid Function Tests: No results for input(s): TSH, T4TOTAL, FREET4, T3FREE, THYROIDAB in the last 72 hours. Anemia Panel: No results for input(s): VITAMINB12, FOLATE, FERRITIN, TIBC, IRON, RETICCTPCT in the last 72 hours.    Radiology Studies: I have reviewed  all of the imaging during this hospital visit personally     Scheduled Meds:  acetaminophen  1,000 mg Oral Q6H   Chlorhexidine Gluconate Cloth  6 each Topical Daily   enoxaparin (LOVENOX) injection  40 mg Subcutaneous Q24H   lip balm  1 application Topical BID   magic mouthwash  10 mL Oral TID   metoprolol tartrate  5 mg Intravenous BID   pantoprazole (PROTONIX) IV  40 mg Intravenous QHS   polycarbophil  625 mg Oral BID   Continuous Infusions:  lactated ringers     lactated ringers 75 mL/hr at 03/20/21 1154   methocarbamol (ROBAXIN) IV       LOS: 5 days        Rayonna Heldman Gerome Apley, MD

## 2021-03-20 NOTE — Progress Notes (Signed)
Robin [name] [id number] 1934-12-16  CARE TEAM:  PCP: [name], PA-C  Outpatient Care Team: Patient Care Team: [name], PA-C as PCP - General (Family Medicine) Skeet Latch, MD as PCP - Cardiology (Cardiology) Milus Banister, MD as Attending Physician (Gastroenterology) [name], MD as Consulting Physician (General Surgery) Cari Caraway, MD as Consulting Physician (Family Medicine)  Inpatient Treatment Team: Treatment Team: Attending Provider: Tawni Millers, MD; Consulting Physician: Edison Pace, Md, MD; Rounding Team: Ian Bushman, MD; Social Worker: Merri Brunette; Case Manager: Lynnell Catalan, RN; Consulting Physician: [name], MD; Registered Nurse: Wayna Chalet, RN; Pharmacist: Lynelle Doctor, [facility]; Registered Nurse: Virginia Rochester, RN; Utilization Review: Rolland Porter, RN   Problem List:   Principal Problem:   SBO (small bowel obstruction) [facility]) Active Problems:   Cecal cancer & polyps s/p lap hemicolectomy 10/31/2020   Paroxysmal atrial fibrillation ([place])   Hypertension   3 Days Post-Op  03/17/2021  Pre-operative Diagnosis:  small bowel obstruction   Post-operative Diagnosis:  same   Surgeon:  [name], MD    Assessment  Postop ileus status post lysis of adhesions 03/17/2021 for a few bands causing a closed-loop obstruction  [facility] Stay = 5 days)  Plan:  -With significant distention and crampy pain and persistent ileus, return nasogastric tube to low remittent wall suction.  Adjust IV fluids.  IV fluid bolus.  Palliate NG tube better.  Nausea control.  Not using PCA.  Okay to DC to simplify regimen.  Continue IV, p.o., and topical treatments for pain control.  Hold off on much enteral medication  Hypertension and other health issues per primary service  Mobilize.  Consider home health evaluation if needed.  Follow electrolytes.  Stable.  -VTE prophylaxis- SCDs, etc  -mobilize as  tolerated to help recovery  Disposition:  Disposition:  The patient is from: Home  Anticipate discharge to:  Home with Home Health  Anticipated Date of Discharge is:  October 9,2022    Barriers to discharge:  Pending Clinical improvement (more likely than not)  Patient currently is NOT MEDICALLY STABLE for discharge from the hospital from a surgery standpoint.      30 minutes spent in review, evaluation, examination, counseling, and coordination of care.   I have reviewed this patient's available data, including medical history, events of note, physical examination and test results as part of my evaluation.  A significant portion of that time was spent in counseling.  Care during the described time interval was provided by me.  03/20/2021    Subjective: (Chief complaint)  No nausea with NG tube clamped but increased distention and persistent crampiness.  Walked in hallways.  Not using PCA much.  NG tube causing sore throat.  Objective:  Vital signs:  Vitals:   03/20/21 0134 03/20/21 0405 03/20/21 0454 03/20/21 0845  BP: (!) 169/86  140/79   Pulse: 83  (!) 101   Resp: 20 13 16 17   Temp: (!) 97.5 F (36.4 C)  98.2 F (36.8 C)   TempSrc: Oral  Oral   SpO2: 100% 100% 100% 99%  Height:        Last BM Date: 03/15/21  Intake/Output   Yesterday:  10/02 0701 - 10/03 0700 In: 500 [I.V.:500] Out: -  This shift:  No intake/output data recorded.  Bowel function:  Flatus: No  BM:  No  Drain: Bilious   Physical Exam:  General: Pt awake/alert in no acute distress Eyes: PERRL, normal EOM.  Sclera clear.  No icterus Neuro: CN II-XII intact w/o focal sensory/motor deficits. Lymph: No head/neck/groin lymphadenopathy Psych:  No delerium/psychosis/paranoia.  Oriented x 4 HENT: Normocephalic, Mucus membranes moist.  No thrush Neck: Supple, No tracheal deviation.  No obvious thyromegaly Chest: No pain to chest wall compression.  Good respiratory excursion.  No  audible wheezing CV:  Pulses intact.  Regular rhythm.  No major extremity edema MS: Normal AROM mjr joints.  No obvious deformity  Abdomen: Somewhat firm.  Very distended.  Mildly tender at incisions only.  No evidence of peritonitis.  No incarcerated hernias.  Ext:  No deformity.  No mjr edema.  No cyanosis Skin: No petechiae / purpurea.  No major sores.  Warm and dry    Results:   Cultures: Recent Results (from the past 720 hour(s))  Resp Panel by RT-PCR (Flu A&B, Covid) Nasopharyngeal Swab     Status: None   Collection Time: 03/15/21  7:21 PM   Specimen: Nasopharyngeal Swab; Nasopharyngeal(NP) swabs in vial transport medium  Result Value Ref Range Status   SARS Coronavirus 2 by RT PCR NEGATIVE NEGATIVE Final    Comment: (NOTE) SARS-CoV-2 target nucleic acids are NOT DETECTED.  The SARS-CoV-2 RNA is generally detectable in upper respiratory specimens during the acute phase of infection. The lowest concentration of SARS-CoV-2 viral copies this assay can detect is 138 copies/mL. A negative result does not preclude SARS-Cov-2 infection and should not be used as the sole basis for treatment or other patient management decisions. A negative result may occur with  improper specimen collection/handling, submission of specimen other than nasopharyngeal swab, presence of viral mutation(s) within the areas targeted by this assay, and inadequate number of viral copies(<138 copies/mL). A negative result must be combined with clinical observations, patient history, and epidemiological information. The expected result is Negative.  Fact Sheet for Patients:  EntrepreneurPulse.com.au  Fact Sheet for Healthcare Providers:  IncredibleEmployment.be  This test is no t yet approved or cleared by the [place] FDA and  has been authorized for detection and/or diagnosis of SARS-CoV-2 by FDA under an Emergency Use Authorization (EUA). This EUA will remain   in effect (meaning this test can be used) for the duration of the COVID-19 declaration under Section 564(b)(1) of the Act, 21 U.S.C.section 360bbb-3(b)(1), unless the authorization is terminated  or revoked sooner.       Influenza A by PCR NEGATIVE NEGATIVE Final   Influenza B by PCR NEGATIVE NEGATIVE Final    Comment: (NOTE) The Xpert Xpress SARS-CoV-2/FLU/RSV plus assay is intended as an aid in the diagnosis of influenza from Nasopharyngeal swab specimens and should not be used as a sole basis for treatment. Nasal washings and aspirates are unacceptable for Xpert Xpress SARS-CoV-2/FLU/RSV testing.  Fact Sheet for Patients: EntrepreneurPulse.com.au  Fact Sheet for Healthcare Providers: IncredibleEmployment.be  This test is not yet approved or cleared by the [place] FDA and has been authorized for detection and/or diagnosis of SARS-CoV-2 by FDA under an Emergency Use Authorization (EUA). This EUA will remain in effect (meaning this test can be used) for the duration of the COVID-19 declaration under Section 564(b)(1) of the Act, 21 U.S.C. section 360bbb-3(b)(1), unless the authorization is terminated or revoked.  Performed at [facility], [place] [address] [place], [place] [address]   Surgical pcr screen     Status: None   Collection Time: 03/17/21 10:08 AM   Specimen: Nasal Mucosa; Nasal Swab  Result Value Ref Range Status   MRSA, PCR  NEGATIVE NEGATIVE Final   Staphylococcus aureus NEGATIVE NEGATIVE Final    Comment: (NOTE) The Xpert SA Assay (FDA approved for NASAL specimens in patients 79 years of age and older), is one component of a comprehensive surveillance program. It is not intended to diagnose infection nor to guide or monitor treatment. Performed at [facility], [place] [address] [place], [place] [address]     Labs: Results for orders placed or performed during the hospital  encounter of 03/15/21 (from the past 48 hour(s))  Basic metabolic panel     Status: Abnormal   Collection Time: 03/19/21  5:37 AM  Result Value Ref Range   Sodium 135 135 - 145 mmol/L   Potassium 4.1 3.5 - 5.1 mmol/L   Chloride 101 98 - 111 mmol/L   CO2 25 22 - 32 mmol/L   Glucose, Bld 122 (H) 70 - 99 mg/dL    Comment: Glucose reference range applies only to samples taken after fasting for at least 8 hours.   BUN 12 8 - 23 mg/dL   Creatinine, Ser 0.39 (L) 0.44 - 1.00 mg/dL   Calcium 8.9 8.9 - 10.3 mg/dL   GFR, Estimated >60 >60 mL/min    Comment: (NOTE) Calculated using the CKD-EPI Creatinine Equation (2021)    Anion gap 9 5 - 15    Comment: Performed at [facility], [place] [address] [place], [place] [address]  CBC with Differential/Platelet     Status: Abnormal   Collection Time: 03/19/21  5:37 AM  Result Value Ref Range   WBC 13.6 (H) 4.0 - 10.5 K/uL   RBC 4.73 3.87 - 5.11 MIL/uL   Hemoglobin 13.7 12.0 - 15.0 g/dL   HCT 42.0 36.0 - 46.0 %   MCV 88.8 80.0 - 100.0 fL   MCH 29.0 26.0 - 34.0 pg   MCHC 32.6 30.0 - 36.0 g/dL   RDW 14.0 11.5 - 15.5 %   Platelets 251 150 - 400 K/uL   nRBC 0.0 0.0 - 0.2 %   Neutrophils Relative % 78 %   Neutro Abs 10.6 (H) 1.7 - 7.7 K/uL   Lymphocytes Relative 14 %   Lymphs Abs 1.9 0.7 - 4.0 K/uL   Monocytes Relative 7 %   Monocytes Absolute 0.9 0.1 - 1.0 K/uL   Eosinophils Relative 1 %   Eosinophils Absolute 0.1 0.0 - 0.5 K/uL   Basophils Relative 0 %   Basophils Absolute 0.0 0.0 - 0.1 K/uL   Immature Granulocytes 0 %   Abs Immature Granulocytes 0.06 0.00 - 0.07 K/uL    Comment: Performed at [facility], [place] [address] [place], [place] [address]  Magnesium     Status: None   Collection Time: 03/19/21  5:37 AM  Result Value Ref Range   Magnesium 2.0 1.7 - 2.4 mg/dL    Comment: Performed at [facility], [place] [address] [place], [place] [address]  Basic metabolic panel     Status:  Abnormal   Collection Time: 03/20/21  4:45 AM  Result Value Ref Range   Sodium 131 (L) 135 - 145 mmol/L   Potassium 3.7 3.5 - 5.1 mmol/L   Chloride 101 98 - 111 mmol/L   CO2 26 22 - 32 mmol/L   Glucose, Bld 131 (H) 70 - 99 mg/dL    Comment: Glucose reference range applies only to samples taken after fasting for at least 8 hours.   BUN 6 (L) 8 - 23 mg/dL  Creatinine, Ser 0.35 (L) 0.44 - 1.00 mg/dL   Calcium 8.9 8.9 - 10.3 mg/dL   GFR, Estimated >60 >60 mL/min    Comment: (NOTE) Calculated using the CKD-EPI Creatinine Equation (2021)    Anion gap 4 (L) 5 - 15    Comment: Performed at [facility], [place] [address] [place], [place] [address]  CBC with Differential/Platelet     Status: Abnormal   Collection Time: 03/20/21  4:45 AM  Result Value Ref Range   WBC 11.0 (H) 4.0 - 10.5 K/uL   RBC 4.96 3.87 - 5.11 MIL/uL   Hemoglobin 14.2 12.0 - 15.0 g/dL   HCT 43.4 36.0 - 46.0 %   MCV 87.5 80.0 - 100.0 fL   MCH 28.6 26.0 - 34.0 pg   MCHC 32.7 30.0 - 36.0 g/dL   RDW 13.9 11.5 - 15.5 %   Platelets 235 150 - 400 K/uL   nRBC 0.0 0.0 - 0.2 %   Neutrophils Relative % 77 %   Neutro Abs 8.5 (H) 1.7 - 7.7 K/uL   Lymphocytes Relative 14 %   Lymphs Abs 1.5 0.7 - 4.0 K/uL   Monocytes Relative 6 %   Monocytes Absolute 0.7 0.1 - 1.0 K/uL   Eosinophils Relative 2 %   Eosinophils Absolute 0.2 0.0 - 0.5 K/uL   Basophils Relative 0 %   Basophils Absolute 0.0 0.0 - 0.1 K/uL   Immature Granulocytes 1 %   Abs Immature Granulocytes 0.06 0.00 - 0.07 K/uL    Comment: Performed at [facility], [place] [address] [place], [place] [address]  Magnesium     Status: None   Collection Time: 03/20/21  4:45 AM  Result Value Ref Range   Magnesium 1.9 1.7 - 2.4 mg/dL    Comment: Performed at [facility], [place] [address] [place], [place] [address]    Imaging / Studies: No results found.  Medications / Allergies: per  chart  Antibiotics: Anti-infectives (From admission, onward)    Start     Dose/Rate Route Frequency Ordered Stop   03/17/21 1200  ertapenem (INVANZ) 1,000 mg in sodium chloride 0.9 % 100 mL IVPB        1 g 200 mL/hr over 30 Minutes Intravenous On call to O.R. 03/17/21 0936 03/17/21 1307         Note: Portions of this report may have been transcribed using voice recognition software. Every effort was made to ensure accuracy; however, inadvertent computerized transcription errors may be present.   Any transcriptional errors that result from this process are unintentional.    Adin Hector, MD, FACS, MASCRS Esophageal, Gastrointestinal & Colorectal Surgery Robotic and Minimally Invasive Surgery  [facility], [place]  [place]. [address], [place], [place] [address] [phone] Fax [phone] Main  CONTACT INFORMATION:  Weekday (9AM-5PM): Call [facility] main office at [phone]  Weeknight (5PM-9AM) or Weekend/Holiday: Check [email or web address] (password " [email or web address]") for General Surgery [facility] coverage  (Please, do not use SecureChat as it is not reliable communication to operating surgeons for immediate patient care)      03/20/2021  8:48 AM

## 2021-03-20 NOTE — Progress Notes (Signed)
PT Cancellation Note/ Screen  Patient Details Name: Robin [name] MRN: [id number] DOB: May 10, 1935   Cancelled Treatment:    Reason Eval/Treat Not Completed: PT screened, no needs identified, will sign off Pt familiar to this PT from previous admission with long LOS.  Pt's BP currently elevated and RN just medicated.  RN plans to assist pt with ambulating later today when BP improved.  RN agreeable to reorder PT if pt has acute needs (unable to safely mobilize with nursing staff).  PT will sign off at this time.   Myrtis Hopping [place] 03/20/2021, 3:31 PM Jannette Spanner PT, DPT Acute Rehabilitation Services Pager: [phone] Office: [phone]

## 2021-03-21 DIAGNOSIS — I10 Essential (primary) hypertension: Secondary | ICD-10-CM | POA: Diagnosis not present

## 2021-03-21 DIAGNOSIS — I480 Paroxysmal atrial fibrillation: Secondary | ICD-10-CM | POA: Diagnosis not present

## 2021-03-21 DIAGNOSIS — K56609 Unspecified intestinal obstruction, unspecified as to partial versus complete obstruction: Secondary | ICD-10-CM | POA: Diagnosis not present

## 2021-03-21 DIAGNOSIS — C180 Malignant neoplasm of cecum: Secondary | ICD-10-CM | POA: Diagnosis not present

## 2021-03-21 LAB — CBC WITH DIFFERENTIAL/PLATELET
Abs Immature Granulocytes: 0.03 10*3/uL (ref 0.00–0.07)
Basophils Absolute: 0 10*3/uL (ref 0.0–0.1)
Basophils Relative: 0 %
Eosinophils Absolute: 0.2 10*3/uL (ref 0.0–0.5)
Eosinophils Relative: 2 %
HCT: 42.5 % (ref 36.0–46.0)
Hemoglobin: 13.8 g/dL (ref 12.0–15.0)
Immature Granulocytes: 0 %
Lymphocytes Relative: 20 %
Lymphs Abs: 1.8 10*3/uL (ref 0.7–4.0)
MCH: 28.8 pg (ref 26.0–34.0)
MCHC: 32.5 g/dL (ref 30.0–36.0)
MCV: 88.5 fL (ref 80.0–100.0)
Monocytes Absolute: 0.7 10*3/uL (ref 0.1–1.0)
Monocytes Relative: 8 %
Neutro Abs: 6.1 10*3/uL (ref 1.7–7.7)
Neutrophils Relative %: 70 %
Platelets: 252 10*3/uL (ref 150–400)
RBC: 4.8 MIL/uL (ref 3.87–5.11)
RDW: 13.9 % (ref 11.5–15.5)
WBC: 8.8 10*3/uL (ref 4.0–10.5)
nRBC: 0 % (ref 0.0–0.2)

## 2021-03-21 LAB — BASIC METABOLIC PANEL
Anion gap: 6 (ref 5–15)
BUN: 9 mg/dL (ref 8–23)
CO2: 25 mmol/L (ref 22–32)
Calcium: 9.5 mg/dL (ref 8.9–10.3)
Chloride: 103 mmol/L (ref 98–111)
Creatinine, Ser: 0.53 mg/dL (ref 0.44–1.00)
GFR, Estimated: >60 mL/min (ref >60)
Glucose, Bld: 101 mg/dL — ABNORMAL HIGH (ref 70–99)
Potassium: 4.1 mmol/L (ref 3.5–5.1)
Sodium: 134 mmol/L — ABNORMAL LOW (ref 135–145)

## 2021-03-21 LAB — MAGNESIUM: Magnesium: 1.8 mg/dL (ref 1.7–2.4)

## 2021-03-21 MED ORDER — METHOCARBAMOL 1000 MG/10ML IJ SOLN
1000.0000 mg | Freq: Three times a day (TID) | INTRAVENOUS | Status: DC
  Administered 2021-03-21 – 2021-03-22 (×3): 1000 mg via INTRAVENOUS
  Filled 2021-03-21 (×3): qty 1000

## 2021-03-21 NOTE — Evaluation (Signed)
Occupational Therapy Evaluation Patient Details Name: Robin [name] MRN: [id number] DOB: 29-May-1935 Today's Date: 03/21/2021   History of Present Illness patient is a 86 yo female admitted with SBO s/p ex lap with lysis of adhesions 9/30. Hx of Afib   Clinical Impression   Patient is a 86 year old female who was admitted for above. Patient was living at home alone prior level. Patients session was limited due to increased HR with minimal activity. Patient was noted to have decreased activity tolerance, decreased cardiopulmomary tolerance, and decreased standing balance. Patient would continue to benefit from skilled OT services at this time while admitted  to address noted deficits in order to improve overall safety and independence in ADLs.       Recommendations for follow up therapy are one component of a multi-disciplinary discharge planning process, led by the attending physician.  Recommendations may be updated based on patient status, additional functional criteria and insurance authorization.   Follow Up Recommendations  No OT follow up    Equipment Recommendations  None recommended by OT    Recommendations for Other Services       Precautions / Restrictions Precautions Precautions: Fall Precaution Comments: monitor HR Restrictions Weight Bearing Restrictions: No      Mobility Bed Mobility                    Transfers Overall transfer level: Needs assistance Equipment used: None Transfers: Sit to/from Stand Sit to Stand: Min guard         General transfer comment: patient noted to have LOB with attempt to transfer from recliner to bed with min A to maintain standing balance.    Balance Overall balance assessment: Needs assistance Sitting-balance support: No upper extremity supported Sitting balance-Leahy Scale: Good     Standing balance support: During functional activity Standing balance-Leahy Scale: Fair                              ADL either performed or assessed with clinical judgement   ADL Overall ADL's : Needs assistance/impaired Eating/Feeding: NPO   Grooming: Wash/dry face;Sitting;Set up   Upper Body Bathing: Sitting;Set up   Lower Body Bathing: Sitting/lateral leans;Minimal assistance   Upper Body Dressing : Sitting;Set up   Lower Body Dressing: Sitting/lateral leans;Minimal assistance   Toilet Transfer: Min Art therapist Details (indicate cue type and reason): patients HR was noted to bed range from 89-126 bpm sitting in recliner. standing to transfer patient was noted to spike to 146 bpm with ranging around 118-137 bpm. nurse made aware. Toileting- Water quality scientist and Hygiene: Min guard;Sit to/from stand         General ADL Comments: patient was noted to have one loss of balance tranferring from recliner to edge of bed with min A to regain standing balance. patient denies dizziness. functional mobility deferred on this date with increased HR and fatigue from being NPO. nurse made aware.     Vision Patient Visual Report: No change from baseline       Perception     Praxis      Pertinent Vitals/Pain Pain Assessment: Faces Faces Pain Scale: No hurt     Hand Dominance Right   Extremity/Trunk Assessment Upper Extremity Assessment Upper Extremity Assessment: Overall WFL for tasks assessed   Lower Extremity Assessment Lower Extremity Assessment: Defer to PT evaluation   Cervical / Trunk Assessment Cervical / Trunk Assessment: Normal   Communication  Communication Communication: No difficulties   Cognition Arousal/Alertness: Awake/alert Behavior During Therapy: WFL for tasks assessed/performed Overall Cognitive Status: Within Functional Limits for tasks assessed                                     General Comments       Exercises     Shoulder Instructions      Home Living Family/patient expects to be discharged to:: Private  residence Living Arrangements: Alone Available Help at Discharge: Family;Available PRN/intermittently Type of Home: House Home Access: Stairs to enter [employer] of Steps: 3-4 Entrance Stairs-Rails: Right Home Layout: One level     Bathroom Shower/Tub: Occupational psychologist: Handicapped height     Home Equipment: Grab bars - tub/shower;Cane - single point;Walker - 2 wheels;Shower seat - built in   Additional Comments: daughter can assist prn      Prior Functioning/Environment Level of Independence: Independent                 OT Problem List: Impaired balance (sitting and/or standing);Decreased safety awareness;Decreased activity tolerance;Cardiopulmonary status limiting activity      OT Treatment/Interventions: Self-care/ADL training;Therapeutic exercise;DME and/or AE instruction;Therapeutic activities;Balance training;Patient/family education    OT Goals(Current goals can be found in the care plan section) Acute Rehab OT Goals Patient Stated Goal: to walk and get bowels moving OT Goal Formulation: With patient Time For Goal Achievement: 04/04/21 Potential to Achieve Goals: Good  OT Frequency: Min 2X/week   Barriers to D/C:            Co-evaluation              AM-PAC OT "6 Clicks" Daily Activity     Outcome Measure Help from another person eating meals?: Total (NPO) Help from another person taking care of personal grooming?: A Little Help from another person toileting, which includes using toliet, bedpan, or urinal?: A Little Help from another person bathing (including washing, rinsing, drying)?: A Little Help from another person to put on and taking off regular upper body clothing?: None Help from another person to put on and taking off regular lower body clothing?: A Little 6 Click Score: 17   End of Session Nurse Communication: Mobility status  Activity Tolerance: Treatment limited secondary to medical complications  (Comment) Patient left: in bed;with call bell/phone within reach;with nursing/sitter in room  OT Visit Diagnosis: Unsteadiness on feet (R26.81);Muscle weakness (generalized) (M62.81);Dizziness and giddiness (R42)                Time: 1610-9604 OT Time Calculation (min): 19 min Charges:  OT General Charges $OT Visit: 1 Visit OT Evaluation $OT Eval Low Complexity: 1 Low  Jackelyn Poling OTR/L, MS Acute Rehabilitation Department Office# [phone] Pager# [phone]   [place] 03/21/2021, 2:44 PM

## 2021-03-21 NOTE — Progress Notes (Signed)
OT Cancellation Note  Patient Details Name: Robin [name] MRN: [id number] DOB: 1935/02/20   Cancelled Treatment:    Reason Eval/Treat Not Completed: Other (comment) Patient reported having taken muscle relaxer about 30 mins ago with effects not having kicked in. Patient declined to move at this time. Will continue to follow and check back as schedule allows.   Jackelyn Poling OTR/L, [place] Acute Rehabilitation Department Office# [phone] Pager# [phone]    03/21/2021, 1:23 PM

## 2021-03-21 NOTE — Progress Notes (Signed)
PROGRESS NOTE    Robin [name]  UUV:[id number] DOB: [birth year] DOA: 03/15/2021 PCP: [name], PA-C    Brief Narrative:  Robin [name] was admitted to the hospital with the working diagnosis of small bowel obstruction due to adhesions Now sp exploratory laparotomy with lysis of adhesions.    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  She underwent hemicolectomy with complete resection of stage I colon carcinoma.  She had a prolonged hospitalization, postoperative ileus requiring TPN. This time patient reported recurrent nausea and vomiting along with abdominal pain.  Her blood pressure was 182/96, heart rate 112, respiratory rate 18, oxygen saturation 98%, her lungs were clear to auscultation bilaterally, heart S1-S2, present, rhythmic, soft abdomen, tender to palpation diffusely, no lower extremity edema.   Sodium 138, potassium 3.4, chloride 98, bicarb 27, glucose 129, BUN 18, creatinine 0.71, lactic acid 1.8, white count 9.4, hemoglobin 16.0, hematocrit 48.7, platelets 315.  SARS COVID-19 negative.   Chest radiograph with hyperinflation, tenting right hemidiaphragm.  No infiltrates.  Positive air-fluid levels in the abdomen.   CT of the abdomen pelvis with small bowel obstruction possibly representing a closed-loop obstruction secondary to internal hernia.  Extensive mesenteric edema and trace ascites.   EKG 101 bpm,, normal axis, normal QTC, atrial fibrillation rhythm, no significant ST segment or T wave changes, positive PVCs.   Patient received supportive medical therapy including nasogastric tube for intermittent suction. Patient failed to improve supportive care, underwent intraoperative lysis of adhesions 9/30. Developed postoperative ileus.   Assessment & Plan:   Principal Problem:   SBO (small bowel obstruction) (HCC) Active Problems:   Paroxysmal atrial fibrillation  (HCC)   Cecal cancer & polyps s/p lap hemicolectomy 10/31/2020   Hypertension   Small bowel obstruction due to adhesions, now sp exploratory laparotomy and lysis of adhesions. Post operative ileus.  Positive bowel sounds but not flatus or bowel movement. NG tube clamped this am. Continue with supportive IV fluids (LR at 50 ml per H), antiacids and as needed antiemetics/ analgesics.  Continue to encourage out of bed to chair tid, PT and OT evaluation.     2. Paroxysmal atrial fibrillation.  Patient with atrial fibrillation, HR has been up to 113 Will place back telemetry monitoring. On metoprolol 5 mg IV BID Holding anticoagulation for now.    3. HTN. Blood pressure 159/99 today, continue close monitoring.    4. Cecal cancer sp hemicolectomy in 10/2020. Patient to follow up as outpatient.    5. Stage 1 sacrum pressure ulcer. Not present on admission. Continue local skin care.      Patient continue to be at high risk for worsening ileus.   Status is: Inpatient  Remains inpatient appropriate because:Inpatient level of care appropriate due to severity of illness  Dispo: The patient is from: Home              Anticipated d/c is to: Home              Patient currently is not medically stable to d/c.   Difficult to place patient No   DVT prophylaxis: Enoxaparin   Code Status:    full  Family Communication:  I spoke with patient's daughter  at the bedside, we talked in detail about patient's condition, plan of care and prognosis and all questions were addressed.      Nutrition Status:           Skin Documentation: Pressure  Injury 11/14/20 Buttocks Right Stage 2 -  Partial thickness loss of dermis presenting as a shallow open injury with a red, pink wound bed without slough. (Active)  11/14/20 2230  Location: Buttocks  Location Orientation: Right  Staging: Stage 2 -  Partial thickness loss of dermis presenting as a shallow open injury with a red, pink wound bed without  slough.  Wound Description (Comments):   Present on Admission:      Pressure Injury 03/19/21 Sacrum Medial Stage 1 -  Intact skin with non-blanchable redness of a localized area usually over a bony prominence. Stage 1 over coccyx (Active)  03/19/21 0900  Location: Sacrum  Location Orientation: Medial  Staging: Stage 1 -  Intact skin with non-blanchable redness of a localized area usually over a bony prominence.  Wound Description (Comments): Stage 1 over coccyx  Present on Admission: No     Consultants:  Surgery     Subjective: Patient with positive bowel sounds, but no flatus or bowel movements. NG tube has been clamped, no nausea or vomiting.   Objective: Vitals:   03/20/21 2017 03/21/21 0343 03/21/21 1054 03/21/21 1408  BP: (!) 170/89 (!) 158/89  (!) 175/99  Pulse: 96 99 70 (!) 113  Resp: 20 20  18   Temp: 97.7 F (36.5 C) 97.8 F (36.6 C)  98 F (36.7 C)  TempSrc: Axillary Oral  Oral  SpO2: 99% 99% 95% 96%  Height:        Intake/Output Summary (Last 24 hours) at 03/21/2021 1444 Last data filed at 03/21/2021 0313 Gross per 24 hour  Intake 1026.51 ml  Output 150 ml  Net 876.51 ml   There were no vitals filed for this visit.  Examination:   General: Not in pain or dyspnea. Deconditioned  Neurology: Awake and alert, non focal  E ENT:  mild pallor, no icterus, oral mucosa moist NG tube in place.  Cardiovascular: No JVD. S1-S2 present, rhythmic, no gallops, rubs, or murmurs. No lower extremity edema. Pulmonary: positive breath sounds bilaterally, adequate air movement, no wheezing, rhonchi or rales. Gastrointestinal. Abdomen mild distended. Non tender, bowel sounds positive  Skin. No rashes Musculoskeletal: no joint deformities     Data Reviewed: I have personally reviewed following labs and imaging studies  CBC: Recent Labs  Lab 03/17/21 0522 03/17/21 1122 03/17/21 1313 03/18/21 0552 03/19/21 0537 03/20/21 0445 03/21/21 0510  WBC 14.1*  --   --   18.2* 13.6* 11.0* 8.8  NEUTROABS 11.3*  --   --  15.7* 10.6* 8.5* 6.1  HGB 13.9   < > 11.9* 14.3 13.7 14.2 13.8  HCT 41.8   < > 35.0* 42.6 42.0 43.4 42.5  MCV 86.9  --   --  86.4 88.8 87.5 88.5  PLT 273  --   --  279 251 235 252   < > = values in this interval not displayed.   Basic Metabolic Panel: Recent Labs  Lab 03/17/21 0522 03/17/21 1122 03/17/21 1313 03/18/21 0552 03/19/21 0537 03/20/21 0445 03/21/21 0510  NA 136 136 136 137 135 131* 134*  K 2.6* 3.1* 3.1* 4.2 4.1 3.7 4.1  CL 99 96*  --  103 101 101 103  CO2 27  --   --  29 25 26 25   GLUCOSE 117* 124*  --  177* 122* 131* 101*  BUN 12 12  --  16 12 6* 9  CREATININE 0.61 0.50  --  0.59 0.39* 0.35* 0.53  CALCIUM 9.8  --   --  9.4 8.9 8.9 9.5  MG 1.6*  --   --  2.2 2.0 1.9 1.8   GFR: CrCl cannot be calculated (Unknown ideal weight.). Liver Function Tests: Recent Labs  Lab 03/15/21 1925  AST 24  ALT 21  ALKPHOS 113  BILITOT 1.9*  PROT 7.7  ALBUMIN 4.7   No results for input(s): LIPASE, AMYLASE in the last 168 hours. No results for input(s): AMMONIA in the last 168 hours. Coagulation Profile: No results for input(s): INR, PROTIME in the last 168 hours. Cardiac Enzymes: No results for input(s): CKTOTAL, CKMB, CKMBINDEX, TROPONINI in the last 168 hours. BNP (last 3 results) No results for input(s): PROBNP in the last 8760 hours. HbA1C: No results for input(s): HGBA1C in the last 72 hours. CBG: No results for input(s): GLUCAP in the last 168 hours. Lipid Profile: No results for input(s): CHOL, HDL, LDLCALC, TRIG, CHOLHDL, LDLDIRECT in the last 72 hours. Thyroid Function Tests: No results for input(s): TSH, T4TOTAL, FREET4, T3FREE, THYROIDAB in the last 72 hours. Anemia Panel: No results for input(s): VITAMINB12, FOLATE, FERRITIN, TIBC, IRON, RETICCTPCT in the last 72 hours.    Radiology Studies: I have reviewed all of the imaging during this hospital visit personally     Scheduled Meds:   acetaminophen  1,000 mg Oral Q6H   Chlorhexidine Gluconate Cloth  6 each Topical Daily   enoxaparin (LOVENOX) injection  40 mg Subcutaneous Q24H   lip balm  1 application Topical BID   magic mouthwash  10 mL Oral TID   metoprolol tartrate  5 mg Intravenous BID   pantoprazole (PROTONIX) IV  40 mg Intravenous QHS   Continuous Infusions:  lactated ringers 50 mL/hr at 03/21/21 0313   methocarbamol (ROBAXIN) IV 1,000 mg (03/21/21 1232)     LOS: 6 days        Keliyah Spillman Gerome Apley, MD

## 2021-03-21 NOTE — Evaluation (Addendum)
Physical Therapy Evaluation Patient Details Name: Robin [name] MRN: [id number] DOB: 1934/10/02 Today's Date: 03/21/2021  History of Present Illness  86 yo female admitted with SBO s/p ex lap with lysis of adhesions 9/30. Hx of Afib  Clinical Impression  On eval, pt was Min guard for mobility. Pt expressed concern about her heart rate at start of session (hadn't received her meds yet)-HR in 110s. She requested assistance onto bsc. Monitored HR with portable pulse ox--readings fluctuated quite a bit but were around upper 80s bpm/lower 90s bpm after standing from bsc. Pt agreeable to walk while continuing to monitor HR. HR >160 bpm while ambulating so immediately returned to room. Monitored HR and O2 with dynamap once seated in recliner. Once seated in recliner, HR reading 132 bpm, O2 89% on RA. Continued to monitor with dynamap and made RN aware. EOS: HR 116 bpm, O2 95% on RA.  Will plan to follow. Will also see if mobility team can assist with mobilizing her as well.    Recommendations for follow up therapy are one component of a multi-disciplinary discharge planning process, led by the attending physician.  Recommendations may be updated based on patient status, additional functional criteria and insurance authorization.  Follow Up Recommendations Home health PT;No PT follow up (depending on progresss. Pt stated whe will make decision closer to d/c)    Equipment Recommendations  None recommended by PT    Recommendations for Other Services       Precautions / Restrictions Precautions Precaution Comments: monitor HR Restrictions Weight Bearing Restrictions: No      Mobility  Bed Mobility Overal bed mobility: Modified Independent             General bed mobility comments: HOB elevated.    Transfers Overall transfer level: Modified independent                  Ambulation/Gait Ambulation/Gait assistance: Min guard Gait Distance (Feet): 75 Feet Assistive device:  IV Pole Gait Pattern/deviations: Step-through pattern;Decreased stride length     General Gait Details: Ambulation distance limited by elevated HR >160 with ambulation (fluctuated quite a bit so unsure of accuracy of reading). Deferred further ambulation and returned to room  Stairs            Wheelchair Mobility    Modified Rankin (Stroke Patients Only)       Balance Overall balance assessment: Needs assistance         Standing balance support: During functional activity Standing balance-Leahy Scale: Fair                               Pertinent Vitals/Pain Pain Assessment: Faces Faces Pain Scale: Hurts little more Pain Location: abdomen (with cramping/spasms) Pain Descriptors / Indicators: Discomfort;Spasm Pain Intervention(s): Monitored during session    Home Living Family/patient expects to be discharged to:: Private residence Living Arrangements: Alone Available Help at Discharge: Family;Available PRN/intermittently Type of Home: House Home Access: Stairs to enter Entrance Stairs-Rails: Right Entrance Stairs-Number of Steps: 3-4 Home Layout: One level Home Equipment: Grab bars - tub/shower;Cane - single point;Walker - 2 wheels;Shower seat - built in Additional Comments: daughter can assist prn    Prior Function Level of Independence: Independent               Hand Dominance   Dominant Hand: Right    Extremity/Trunk Assessment   Upper Extremity Assessment Upper Extremity Assessment: Defer to OT evaluation  Lower Extremity Assessment Lower Extremity Assessment: Overall WFL for tasks assessed    Cervical / Trunk Assessment Cervical / Trunk Assessment: Normal  Communication   Communication: No difficulties  Cognition Arousal/Alertness: Awake/alert Behavior During Therapy: WFL for tasks assessed/performed Overall Cognitive Status: Within Functional Limits for tasks assessed                                         General Comments      Exercises     Assessment/Plan    PT Assessment Patient needs continued PT services  PT Problem List Decreased mobility;Pain;Decreased activity tolerance;Decreased balance;Decreased strength       PT Treatment Interventions DME instruction;Gait training;Therapeutic exercise;Balance training;Functional mobility training;Therapeutic activities;Patient/family education    PT Goals (Current goals can be found in the Care Plan section)  Acute Rehab PT Goals Patient Stated Goal: to walk and get bowels moving PT Goal Formulation: With patient Time For Goal Achievement: 04/04/21 Potential to Achieve Goals: Good    Frequency Min 3X/week   Barriers to discharge        Co-evaluation               AM-PAC PT "6 Clicks" Mobility  Outcome Measure Help needed turning from your back to your side while in a flat bed without using bedrails?: None Help needed moving from lying on your back to sitting on the side of a flat bed without using bedrails?: None Help needed moving to and from a bed to a chair (including a wheelchair)?: A Little Help needed standing up from a chair using your arms (e.g., wheelchair or bedside chair)?: A Little Help needed to walk in hospital room?: A Little Help needed climbing 3-5 steps with a railing? : A Little 6 Click Score: 20    End of Session   Activity Tolerance:  (Session limited by increased HR/Afib) Patient left: in chair;with call bell/phone within reach   PT Visit Diagnosis: Unsteadiness on feet (R26.81);Pain Pain - part of body:  (abdomen)    Time: 7711-6579 PT Time Calculation (min) (ACUTE ONLY): 31 min   Charges:   PT Evaluation $PT Eval Moderate Complexity: 1 Mod PT Treatments $Gait Training: 8-22 mins         Doreatha Massed, PT Acute Rehabilitation  Office: [phone] Pager: [phone]

## 2021-03-21 NOTE — Progress Notes (Addendum)
    4 Days Post-Op  Subjective: Having some gas pains on both sides of abdomen.  No flatus yet but feels like she needs to pass some.  Staff couldn't get her up and walk her yesterday.  Did sit in the chair.  ROS: See above, otherwise other systems negative  Objective: Vital signs in last 24 hours: Temp:  [97.7 F (36.5 C)-98.4 F (36.9 C)] 97.8 F (36.6 C) (10/04 0343) Pulse Rate:  [91-99] 99 (10/04 0343) Resp:  [16-20] 20 (10/04 0343) BP: (152-178)/(86-94) 158/89 (10/04 0343) SpO2:  [98 %-100 %] 99 % (10/04 0343) Last BM Date: 03/15/21  Intake/Output from previous day: 10/03 0701 - 10/04 0700 In: 1026.5 [I.V.:846.5; NG/GT:180] Out: 725 [Urine:225; Emesis/NG output:500] Intake/Output this shift: No intake/output data recorded.  PE: Abd: soft, appropriately tender, great BS today, slightly poochy, midline incision is c/d/I with staples present.  NGT with minimal output.  Lab Results:  Recent Labs    03/20/21 0445 03/21/21 0510  WBC 11.0* 8.8  HGB 14.2 13.8  HCT 43.4 42.5  PLT 235 252   BMET Recent Labs    03/20/21 0445 03/21/21 0510  NA 131* 134*  K 3.7 4.1  CL 101 103  CO2 26 25  GLUCOSE 131* 101*  BUN 6* 9  CREATININE 0.35* 0.53  CALCIUM 8.9 9.5   PT/INR No results for input(s): LABPROT, INR in the last 72 hours. CMP     Component Value Date/Time   NA 134 (L) 03/21/2021 0510   NA 139 02/21/2018 0854   K 4.1 03/21/2021 0510   CL 103 03/21/2021 0510   CO2 25 03/21/2021 0510   GLUCOSE 101 (H) 03/21/2021 0510   BUN 9 03/21/2021 0510   BUN 20 02/21/2018 0854   CREATININE 0.53 03/21/2021 0510   CREATININE 0.54 (L) 11/28/2015 1422   CALCIUM 9.5 03/21/2021 0510   PROT 7.7 03/15/2021 1925   ALBUMIN 4.7 03/15/2021 1925   AST 24 03/15/2021 1925   ALT 21 03/15/2021 1925   ALKPHOS 113 03/15/2021 1925   BILITOT 1.9 (H) 03/15/2021 1925   GFRNONAA >60 03/21/2021 0510   GFRAA 78 02/21/2018 0854   Lipase     Component Value Date/Time   LIPASE 31  10/26/2020 1919       Studies/Results: No results found.  Anti-infectives: Anti-infectives (From admission, onward)    Start     Dose/Rate Route Frequency Ordered Stop   03/17/21 1200  ertapenem (INVANZ) 1,000 mg in sodium chloride 0.9 % 100 mL IVPB        1 g 200 mL/hr over 30 Minutes Intravenous On call to O.R. 03/17/21 0936 03/17/21 1307        Assessment/Plan POD 4, s/p ex lap with LOA by Dr. Harlow Asa 9/30 for SBO -ileus seems to be improving as she has great BS, but no bowel function yet.  Will clamp NGT today and see how she does.  If she develops N/V return to [facility] -mobilize, needs to ambulate.  Will order PT as well per patient request -multi-modal pain control -IS/pulm toilet  FEN - NPO, NGT clamped/IVFs VTE - Lovenox ID - none needed currently   LOS: 6 days    Henreitta Cea , [facility] Surgery 03/21/2021, 8:15 AM Please see Amion for pager number during day hours 7:00am-4:30pm or 7:00am -11:30am on weekends

## 2021-03-22 ENCOUNTER — Inpatient Hospital Stay (HOSPITAL_COMMUNITY): Payer: Medicare PPO | Admitting: Anesthesiology

## 2021-03-22 ENCOUNTER — Inpatient Hospital Stay (HOSPITAL_COMMUNITY): Payer: Medicare PPO

## 2021-03-22 ENCOUNTER — Encounter (HOSPITAL_COMMUNITY)
Admission: EM | Disposition: A | Discharge status: Skilled Nursing Facility | Payer: Self-pay | Source: Home / Self Care | Attending: Internal Medicine

## 2021-03-22 ENCOUNTER — Encounter (HOSPITAL_COMMUNITY): Payer: Self-pay | Admitting: Internal Medicine

## 2021-03-22 DIAGNOSIS — I63512 Cerebral infarction due to unspecified occlusion or stenosis of left middle cerebral artery: Secondary | ICD-10-CM | POA: Diagnosis present

## 2021-03-22 DIAGNOSIS — I6602 Occlusion and stenosis of left middle cerebral artery: Secondary | ICD-10-CM | POA: Insufficient documentation

## 2021-03-22 DIAGNOSIS — I63412 Cerebral infarction due to embolism of left middle cerebral artery: Secondary | ICD-10-CM | POA: Diagnosis not present

## 2021-03-22 DIAGNOSIS — I480 Paroxysmal atrial fibrillation: Secondary | ICD-10-CM | POA: Diagnosis not present

## 2021-03-22 DIAGNOSIS — I10 Essential (primary) hypertension: Secondary | ICD-10-CM | POA: Diagnosis not present

## 2021-03-22 DIAGNOSIS — I639 Cerebral infarction, unspecified: Secondary | ICD-10-CM

## 2021-03-22 DIAGNOSIS — C180 Malignant neoplasm of cecum: Secondary | ICD-10-CM | POA: Diagnosis not present

## 2021-03-22 DIAGNOSIS — I6340 Cerebral infarction due to embolism of unspecified cerebral artery: Secondary | ICD-10-CM | POA: Insufficient documentation

## 2021-03-22 DIAGNOSIS — K56609 Unspecified intestinal obstruction, unspecified as to partial versus complete obstruction: Secondary | ICD-10-CM | POA: Diagnosis not present

## 2021-03-22 HISTORY — PX: IR ANGIO INTRA EXTRACRAN SEL INTERNAL CAROTID BILAT MOD SED: IMG5363

## 2021-03-22 HISTORY — PX: IR ANGIO VERTEBRAL SEL VERTEBRAL BILAT MOD SED: IMG5369

## 2021-03-22 HISTORY — PX: RADIOLOGY WITH ANESTHESIA: SHX6223

## 2021-03-22 LAB — CBC WITH DIFFERENTIAL/PLATELET
Abs Immature Granulocytes: 0.04 10*3/uL (ref 0.00–0.07)
Basophils Absolute: 0 10*3/uL (ref 0.0–0.1)
Basophils Relative: 0 %
Eosinophils Absolute: 0.1 10*3/uL (ref 0.0–0.5)
Eosinophils Relative: 1 %
HCT: 40.3 % (ref 36.0–46.0)
Hemoglobin: 13.4 g/dL (ref 12.0–15.0)
Immature Granulocytes: 0 %
Lymphocytes Relative: 17 %
Lymphs Abs: 1.6 10*3/uL (ref 0.7–4.0)
MCH: 28.9 pg (ref 26.0–34.0)
MCHC: 33.3 g/dL (ref 30.0–36.0)
MCV: 86.9 fL (ref 80.0–100.0)
Monocytes Absolute: 0.8 10*3/uL (ref 0.1–1.0)
Monocytes Relative: 9 %
Neutro Abs: 6.5 10*3/uL (ref 1.7–7.7)
Neutrophils Relative %: 73 %
Platelets: 225 10*3/uL (ref 150–400)
RBC: 4.64 MIL/uL (ref 3.87–5.11)
RDW: 13.9 % (ref 11.5–15.5)
WBC: 9.1 10*3/uL (ref 4.0–10.5)
nRBC: 0 % (ref 0.0–0.2)

## 2021-03-22 LAB — BASIC METABOLIC PANEL
Anion gap: 10 (ref 5–15)
BUN: 14 mg/dL (ref 8–23)
CO2: 25 mmol/L (ref 22–32)
Calcium: 9.1 mg/dL (ref 8.9–10.3)
Chloride: 98 mmol/L (ref 98–111)
Creatinine, Ser: 0.53 mg/dL (ref 0.44–1.00)
GFR, Estimated: >60 mL/min (ref >60)
Glucose, Bld: 99 mg/dL (ref 70–99)
Potassium: 3.1 mmol/L — ABNORMAL LOW (ref 3.5–5.1)
Sodium: 133 mmol/L — ABNORMAL LOW (ref 135–145)

## 2021-03-22 LAB — GLUCOSE, CAPILLARY: Glucose-Capillary: 107 mg/dL — ABNORMAL HIGH (ref 70–99)

## 2021-03-22 LAB — MAGNESIUM: Magnesium: 1.7 mg/dL (ref 1.7–2.4)

## 2021-03-22 SURGERY — IR WITH ANESTHESIA
Anesthesia: General

## 2021-03-22 MED ORDER — ACETAMINOPHEN 325 MG PO TABS: 650.0000 mg | ORAL_TABLET | ORAL | Status: DC | PRN

## 2021-03-22 MED ORDER — ACETAMINOPHEN 160 MG/5ML PO SOLN: 650.0000 mg | ORAL | Status: DC | PRN

## 2021-03-22 MED ORDER — EPTIFIBATIDE 20 MG/10ML IV SOLN
INTRAVENOUS | Status: AC
  Filled 2021-03-22: qty 10

## 2021-03-22 MED ORDER — DIPHENHYDRAMINE HCL 50 MG/ML IJ SOLN
INTRAMUSCULAR | Status: AC
  Filled 2021-03-22: qty 1

## 2021-03-22 MED ORDER — ACETAMINOPHEN 650 MG RE SUPP: 650.0000 mg | RECTAL | Status: DC | PRN

## 2021-03-22 MED ORDER — DIPHENHYDRAMINE HCL 50 MG/ML IJ SOLN
50.0000 mg | Freq: Once | INTRAMUSCULAR | Status: AC
  Administered 2021-03-22: 50 mg via INTRAVENOUS

## 2021-03-22 MED ORDER — METOPROLOL SUCCINATE ER 25 MG PO TB24
100.0000 mg | ORAL_TABLET | Freq: Every day | ORAL | Status: DC
  Administered 2021-03-22: 100 mg via ORAL
  Filled 2021-03-22: qty 2

## 2021-03-22 MED ORDER — METHYLPREDNISOLONE SODIUM SUCC 125 MG IJ SOLR
INTRAMUSCULAR | Status: AC
  Filled 2021-03-22: qty 2

## 2021-03-22 MED ORDER — LIDOCAINE 2% (20 MG/ML) 5 ML SYRINGE
INTRAMUSCULAR | Status: DC | PRN
  Administered 2021-03-22: 20 mg via INTRAVENOUS

## 2021-03-22 MED ORDER — CEFAZOLIN SODIUM-DEXTROSE 2-3 GM-%(50ML) IV SOLR
INTRAVENOUS | Status: DC | PRN
  Administered 2021-03-22: 2 g via INTRAVENOUS

## 2021-03-22 MED ORDER — SODIUM CHLORIDE 0.9 % IV SOLN: INTRAVENOUS | Status: DC

## 2021-03-22 MED ORDER — IOHEXOL 300 MG/ML  SOLN
50.0000 mL | Freq: Once | INTRAMUSCULAR | Status: AC | PRN
  Administered 2021-03-22: 30 mL via INTRA_ARTERIAL

## 2021-03-22 MED ORDER — PROPOFOL 10 MG/ML IV BOLUS
INTRAVENOUS | Status: DC | PRN
  Administered 2021-03-22: 50 mg via INTRAVENOUS

## 2021-03-22 MED ORDER — APIXABAN 5 MG PO TABS
5.0000 mg | ORAL_TABLET | Freq: Two times a day (BID) | ORAL | Status: DC
  Filled 2021-03-22: qty 1

## 2021-03-22 MED ORDER — TIROFIBAN HCL IN NACL 5-0.9 MG/100ML-% IV SOLN
INTRAVENOUS | Status: AC
  Filled 2021-03-22: qty 100

## 2021-03-22 MED ORDER — TENECTEPLASE FOR STROKE
0.2500 mg/kg | PACK | Freq: Once | INTRAVENOUS | Status: DC
  Filled 2021-03-22: qty 3

## 2021-03-22 MED ORDER — METHYLPREDNISOLONE SODIUM SUCC 125 MG IJ SOLR
125.0000 mg | Freq: Once | INTRAMUSCULAR | Status: AC
  Administered 2021-03-22: 125 mg via INTRAVENOUS

## 2021-03-22 MED ORDER — HEPARIN SODIUM (PORCINE) 5000 UNIT/ML IJ SOLN: 5000.0000 [IU] | Freq: Three times a day (TID) | INTRAMUSCULAR | Status: DC

## 2021-03-22 MED ORDER — VERAPAMIL HCL 2.5 MG/ML IV SOLN
INTRAVENOUS | Status: AC
  Filled 2021-03-22: qty 2

## 2021-03-22 MED ORDER — PANTOPRAZOLE SODIUM 40 MG PO TBEC
40.0000 mg | DELAYED_RELEASE_TABLET | Freq: Every day | ORAL | Status: DC
  Administered 2021-03-22 – 2021-03-28 (×7): 40 mg via ORAL
  Filled 2021-03-22 (×7): qty 1

## 2021-03-22 MED ORDER — PHENYLEPHRINE 40 MCG/ML (10ML) SYRINGE FOR IV PUSH (FOR BLOOD PRESSURE SUPPORT)
PREFILLED_SYRINGE | INTRAVENOUS | Status: DC | PRN
Start: 2021-03-22 — End: 2021-03-22
  Administered 2021-03-22: 200 ug via INTRAVENOUS

## 2021-03-22 MED ORDER — CLOPIDOGREL BISULFATE 300 MG PO TABS
ORAL_TABLET | ORAL | Status: AC
  Filled 2021-03-22: qty 1

## 2021-03-22 MED ORDER — SENNOSIDES-DOCUSATE SODIUM 8.6-50 MG PO TABS: 1.0000 | ORAL_TABLET | Freq: Every evening | ORAL | Status: DC | PRN

## 2021-03-22 MED ORDER — ROCURONIUM BROMIDE 10 MG/ML (PF) SYRINGE
PREFILLED_SYRINGE | INTRAVENOUS | Status: DC | PRN
  Administered 2021-03-22: 80 mg via INTRAVENOUS

## 2021-03-22 MED ORDER — CLEVIDIPINE BUTYRATE 0.5 MG/ML IV EMUL
INTRAVENOUS | Status: DC | PRN
  Administered 2021-03-22: 1 mg/h via INTRAVENOUS
  Administered 2021-03-22: 2 mg/h via INTRAVENOUS

## 2021-03-22 MED ORDER — STROKE: EARLY STAGES OF RECOVERY BOOK
Freq: Once | Status: DC
  Filled 2021-03-22: qty 1

## 2021-03-22 MED ORDER — CLEVIDIPINE BUTYRATE 0.5 MG/ML IV EMUL
INTRAVENOUS | Status: AC
  Filled 2021-03-22: qty 50

## 2021-03-22 MED ORDER — PANTOPRAZOLE SODIUM 40 MG IV SOLR: 40.0000 mg | Freq: Every day | INTRAVENOUS | Status: DC

## 2021-03-22 MED ORDER — POTASSIUM CHLORIDE CRYS ER 20 MEQ PO TBCR
40.0000 meq | EXTENDED_RELEASE_TABLET | ORAL | Status: AC
  Administered 2021-03-22: 40 meq via ORAL
  Filled 2021-03-22: qty 2

## 2021-03-22 MED ORDER — TICAGRELOR 90 MG PO TABS
ORAL_TABLET | ORAL | Status: AC
  Filled 2021-03-22: qty 2

## 2021-03-22 MED ORDER — CLEVIDIPINE BUTYRATE 0.5 MG/ML IV EMUL
0.0000 mg/h | INTRAVENOUS | Status: AC
  Administered 2021-03-22: 10 mg/h via INTRAVENOUS
  Administered 2021-03-22: 6 mg/h via INTRAVENOUS
  Administered 2021-03-22: 20 mg/h via INTRAVENOUS
  Administered 2021-03-23: 6 mg/h via INTRAVENOUS
  Filled 2021-03-22 (×4): qty 50

## 2021-03-22 MED ORDER — SUGAMMADEX SODIUM 200 MG/2ML IV SOLN
INTRAVENOUS | Status: DC | PRN
  Administered 2021-03-22: 400 mg via INTRAVENOUS

## 2021-03-22 MED ORDER — CANGRELOR TETRASODIUM 50 MG IV SOLR
INTRAVENOUS | Status: AC
  Filled 2021-03-22: qty 50

## 2021-03-22 MED ORDER — IOHEXOL 350 MG/ML SOLN
100.0000 mL | Freq: Once | INTRAVENOUS | Status: AC | PRN
  Administered 2021-03-22: 40 mL via INTRA_ARTERIAL

## 2021-03-22 MED ORDER — SODIUM CHLORIDE 0.9 % IV SOLN: INTRAVENOUS | Status: DC | PRN

## 2021-03-22 MED ORDER — CEFAZOLIN SODIUM-DEXTROSE 2-4 GM/100ML-% IV SOLN
INTRAVENOUS | Status: AC
  Filled 2021-03-22: qty 100

## 2021-03-22 MED ORDER — MAGNESIUM SULFATE 2 GM/50ML IV SOLN
2.0000 g | Freq: Once | INTRAVENOUS | Status: DC
  Filled 2021-03-22 (×2): qty 50

## 2021-03-22 MED ORDER — ASPIRIN 81 MG PO CHEW
CHEWABLE_TABLET | ORAL | Status: AC
  Filled 2021-03-22: qty 1

## 2021-03-22 MED ORDER — NITROGLYCERIN 1 MG/10 ML FOR IR/CATH LAB
INTRA_ARTERIAL | Status: AC
  Filled 2021-03-22: qty 10

## 2021-03-22 MED ORDER — IOHEXOL 350 MG/ML SOLN
100.0000 mL | Freq: Once | INTRAVENOUS | Status: AC | PRN
  Administered 2021-03-22: 26 mL via INTRA_ARTERIAL

## 2021-03-22 NOTE — Progress Notes (Signed)
Code stroke called. Spoke with nurse who advised pt. Has venous access. No further access needed at this time.

## 2021-03-22 NOTE — Progress Notes (Signed)
   03/22/21 1252  What Happened  Was fall witnessed? No  Was patient injured? Unsure  Patient found in bathroom  Found by Staff-comment  Stated prior activity other (comment) (leaning against wall, hit wall, not on floor, did not hit the ground)  Follow Up  MD notified Arrien  Time MD notified 35  Family notified Yes - comment  Time family notified 1330 (arrived in room)  Additional tests Yes-comment (CT possible stroke)  Simple treatment Other (comment)  Progress note created (see row info) Yes  Adult Fall Risk Assessment  Risk Factor Category (scoring not indicated) High fall risk per protocol (document High fall risk) (due to this fall)  Age 86  Fall History: Fall within 6 months prior to admission 5  Elimination; Bowel and/or Urine Incontinence 0  Elimination; Bowel and/or Urine Urgency/Frequency 0  Medications: includes PCA/Opiates, Anti-convulsants, Anti-hypertensives, Diuretics, Hypnotics, Laxatives, Sedatives, and Psychotropics 5  Patient Care Equipment 2  Mobility-Assistance 2  Mobility-Gait 0  Mobility-Sensory Deficit 0  Altered awareness of immediate physical environment 0  Impulsiveness 0  Lack of understanding of one's physical/cognitive limitations 0  Total Score 17  Patient Fall Risk Level High fall risk (post fall values)  Adult Fall Risk Interventions  Required Bundle Interventions *See Row Information* High fall risk - low, moderate, and high requirements implemented  Additional Interventions Use of appropriate toileting equipment (bedpan, BSC, etc.)  Screening for Fall Injury Risk (To be completed on HIGH fall risk patients) - Assessing Need for Floor Mats  Risk For Fall Injury- Criteria for Floor Mats None identified - No additional interventions needed  Will Implement Floor Mats Yes  Vitals  Temp 97.6 F (36.4 C)  Temp Source Oral  BP (!) 162/94  MAP (mmHg) 115  BP Method Automatic  Pulse Rate (!) 112  Pulse Rate Source Monitor  Oxygen Therapy   SpO2 100 %  Pain Assessment  Pain Scale 0-10  Pain Score 4 (with movement)  Pain Type Acute pain (rt shoulder)  Pain Location Shoulder  Pain Orientation Right  Pain Descriptors / Indicators Discomfort  Pain Frequency Intermittent  Pain Onset With Activity  Pain Intervention(s) Refused  Multiple Pain Sites Yes  2nd Pain Site  Pain Score 4  Faces Pain Scale 6  Pain Type Acute pain  Pain Location Back  Pain Orientation Right  Pain Descriptors / Indicators Aching  Pain Frequency Other (Comment)  Pain Onset Other (Comment) (with fall)  Neurological  Neuro (WDL) WDL  Level of Consciousness Alert  Orientation Level Oriented X4  Glasgow Coma Scale  Eye Opening 4  Best Verbal Response (NON-intubated) 5  Best Motor Response 6  Glasgow Coma Scale Score 15  Musculoskeletal  Musculoskeletal (WDL) X  Assistive Device Front wheel walker  Generalized Weakness Yes  Weight Bearing Restrictions No  Integumentary  Integumentary (WDL) WDL  Patient fell and hit the wall, not onto the floor, tech came into the bathroom and helped patient onto bed,  patient complaining of back and shoulder pain, tech called the nurse.  Nurse evaluated patient and notified MD at time of fall. Patient not noted to have aphasia at time of fall but when reevaluating at 13:30 patient aphasia noted, code stroke called and CT ordered.  Patient then transferred to ICU.

## 2021-03-22 NOTE — Anesthesia Preprocedure Evaluation (Addendum)
Anesthesia Evaluation  Patient identified by MRN, date of birth, ID band Patient awake and Patient confused    Reviewed: Allergy & Precautions, NPO status , Patient's Chart, lab work & pertinent test results, reviewed documented beta blocker date and time   Airway Mallampati: II  TM Distance: >3 FB Neck ROM: Full    Dental  (+) Teeth Intact, Dental Advisory Given   Pulmonary neg pulmonary ROS,    Pulmonary exam normal breath sounds clear to auscultation       Cardiovascular hypertension, Pt. on home beta blockers and Pt. on medications + Peripheral Vascular Disease  Normal cardiovascular exam+ dysrhythmias Atrial Fibrillation  Rhythm:Irregular Rate:Normal  HLD  TTE 2022 1. Left ventricular ejection fraction, by estimation, is 60 to 65%. The  left ventricle has normal function. The left ventricle has no regional  wall motion abnormalities. Left ventricular diastolic parameters are  indeterminate.  2. Right ventricular systolic function is normal. The right ventricular  size is normal. There is normal pulmonary artery systolic pressure. The  estimated right ventricular systolic pressure is 09.3 mmHg.  3. Right atrial size was mildly dilated.  4. The mitral valve is normal in structure. Trivial mitral valve  regurgitation.  5. The aortic valve was not well visualized. Aortic valve regurgitation  is not visualized. No aortic stenosis is present.  6. The inferior vena cava is normal in size with greater than 50%  respiratory variability, suggesting right atrial pressure of 3 mmHg.   Stress Test 2019 Nuclear stress EF: 81%. The left ventricular ejection fraction is hyperdynamic (>65%). The study is normal. This is a low risk study.    Neuro/Psych PSYCHIATRIC DISORDERS Anxiety CVA, Residual Symptoms    GI/Hepatic negative GI ROS, Neg liver ROS,   Endo/Other  negative endocrine ROS  Renal/GU negative Renal ROS   negative genitourinary   Musculoskeletal negative musculoskeletal ROS (+)   Abdominal   Peds  Hematology  (+) Blood dyscrasia (on eliquis), ,   Anesthesia Other Findings Code Stroke: aphasia and right arm weakness  S/p hemicolectomy for colon CA 10/2020. Most recently s/p ex lap for SBO 02/3021. Eliquis was held for surgery. Still inpatient and found after unwitnessed fall with aphasia. Now presents with acute stroke.   Reproductive/Obstetrics                           Anesthesia Physical  Anesthesia Plan  ASA: 3  Anesthesia Plan: General   Post-op Pain Management:    Induction: Intravenous and Rapid sequence  PONV Risk Score and Plan: 3 and Dexamethasone, Ondansetron and Treatment may vary due to age or medical condition  Airway Management Planned: Oral ETT  Additional Equipment:   Intra-op Plan:   Post-operative Plan: Extubation in OR and Possible Post-op intubation/ventilation  Informed Consent: I have reviewed the patients History and Physical, chart, labs and discussed the procedure including the risks, benefits and alternatives for the proposed anesthesia with the patient or authorized representative who has indicated his/her understanding and acceptance.     Dental advisory given  Plan Discussed with: CRNA  Anesthesia Plan Comments:        Anesthesia Quick Evaluation

## 2021-03-22 NOTE — Consult Note (Signed)
[facility] Telemedicine Consult   Requesting Provider: Dr. Cathlean Sauer Consult Participants: Dr. Jerelyn Charles, Telespecialist RN Leana Roe   Bedside RN -Joylene John Location of the provider: [facility] Location of the patient: [facility]  This consult was provided via telemedicine with 2-way video and audio communication. The patient/family was informed that care would be provided in this way and agreed to receive care in this manner.    Chief Complaint: aphasia, right sided weakness  HPI: 86 year old admitted to the hospital on 03/15/2021 with chief complaint of abdominal pain.  Has a history of atrial fibrillation on Eliquis which was held because she needed exploratory laparotomy, history of hypertension.  She had a GI bleed in May, colonoscopy revealed site of bleed to be a colon cancer in the cecum.  She underwent hemicolectomy with complete resection of stage I colon cancer.  She had a prolonged postop course due to ileus requiring TPN.  No subsequent chemotherapy received due to early stage and high chance of surgical cure.  She presented to the ED with complaints of abdominal pain nausea and vomiting going on for more than a day.  Symptoms were worse.  Her abdominal pain was diffuse with no aggravating or relieving factors.  Her Eliquis was held.  Surgery was consulted, NG tube was inserted due to findings of small bowel obstruction without signs of peritonitis.  Supportive care seemed to be ineffective and she was taken for expiratory laparotomy and intraoperative lysis of additions on 03/17/2021.  She developed postoperative ileus and is also being managed for that. Today she was in her usual state of health walking with physical therapy 12:45 PM and at 12:52 PM was noted to be on the ground with an unwitnessed fall and found to be aphasic-word finding difficulty since then.  She was also having trouble moving her right arm-which was mainly due to pain in the right shoulder. Code stroke was called and  she was evaluated in the [place] [facility]. See detailed exam and NIH stroke scale below. Not a candidate for tPA due to major abdominal surgery done less than a week ago. Severe allergy to iodinated contrast hence vessel imaging by CTA not performed. Stat MRI head, MRA head without contrast, MRA neck without contrast ordered.   Blood pressure systolic 191Y to 782N.  Blood sugar 107.  Past Medical History:  Diagnosis Date   Anxiety    AV bloc first degree    Carotid stenosis 03/02/2017   Hyperlipidemia    Hypertension    Lower extremity edema 12/30/2020   Lumbar pain    fell off of her horse and has low back pain at times   Persistent atrial fibrillation ([place]) 12/30/2020   Venous insufficiency of both lower extremities    Vertigo      Current Facility-Administered Medications:    0.9 %  sodium chloride infusion, , Intravenous, Continuous, Arrien, Jimmy Picket, MD   0.9 %  sodium chloride infusion, , Intravenous, Continuous, Amie Portland, MD   [DISCONTINUED] acetaminophen (TYLENOL) tablet 650 mg, 650 mg, Oral, Q4H PRN **OR** acetaminophen (TYLENOL) 160 MG/5ML solution 650 mg, 650 mg, Per Tube, Q4H PRN **OR** [DISCONTINUED] acetaminophen (TYLENOL) suppository 650 mg, 650 mg, Rectal, Q4H PRN, Amie Portland, MD   acetaminophen (TYLENOL) tablet 1,000 mg, 1,000 mg, Oral, Q6H, Saverio Danker, PA-C   alum & mag hydroxide-simeth (MAALOX/MYLANTA) 200-200-20 MG/5ML suspension 30 mL, 30 mL, Oral, Q6H PRN, [name], MD   Chlorhexidine Gluconate Cloth 2 % PADS 6 each, 6  each, Topical, Daily, [name], MD, 6 each at 03/22/21 0850   hydrALAZINE (APRESOLINE) injection 10 mg, 10 mg, Intravenous, Q4H PRN, [name], MD, 10 mg at 03/20/21 0148   HYDROmorphone (DILAUDID) injection 0.5-1 mg, 0.5-1 mg, Intravenous, Q2H PRN, [name], MD, 1 mg at 03/20/21 2334   labetalol (NORMODYNE) injection 10 mg, 10 mg, Intravenous, Q4H PRN, [name], MD, 10 mg at 03/21/21  1441   lip balm (CARMEX) ointment 1 application, 1 application, Topical, BID, [name], MD, 1 application at 03/00/92 3300   magic mouthwash, 10 mL, Oral, TID, [name], MD, 10 mL at 03/22/21 0851   magnesium sulfate IVPB 2 g 50 mL, 2 g, Intravenous, Once, Arrien, Jimmy Picket, MD   menthol-cetylpyridinium (CEPACOL) lozenge 3 mg, 1 lozenge, Oral, PRN, [name], MD   metoprolol succinate (TOPROL-XL) 24 hr tablet 100 mg, 100 mg, Oral, Daily, Arrien, Jimmy Picket, MD, 100 mg at 03/22/21 1348   ondansetron (ZOFRAN) injection 4 mg, 4 mg, Intravenous, Q6H PRN, Arrien, Jimmy Picket, MD   pantoprazole (PROTONIX) EC tablet 40 mg, 40 mg, Oral, Daily, Arrien, Jimmy Picket, MD, 40 mg at 03/22/21 1349   phenol (CHLORASEPTIC) mouth spray 2 spray, 2 spray, Mouth/Throat, PRN, [name], MD   potassium chloride SA (KLOR-CON) CR tablet 40 mEq, 40 mEq, Oral, Q4H, Arrien, Jimmy Picket, MD, 40 mEq at 03/22/21 1349   senna-docusate (Senokot-S) tablet 1 tablet, 1 tablet, Oral, QHS PRN, Amie Portland, MD   simethicone (MYLICON) 40 TM/2.2QJ suspension 80 mg, 80 mg, Oral, QID PRN, [name], MD    LKW: 3354 hrs tpa given?: No, recent exploratory laparotomy 03/17/2021 IR Thrombectomy? Y Modified Rankin Scale: 1-No significant post stroke disability and can perform usual duties with stroke symptoms Time of teleneurologist evaluation: 1438 hrs  Exam: Vitals:   03/22/21 1252 03/22/21 1337  BP: (!) 162/94 111/62  Pulse: (!) 112 (!) 103  Resp:    Temp: 97.6 F (36.4 C) 97.7 F (36.5 C)  SpO2: 100%     General: Awake, alert, no distress Neurological exam Awake, alert Able to tell me her name Unable to tell me her age Unable to repeat sentences Naming impaired Word finding difficulty and broken speech Cranial nerves: Pupils equal round react light, extraocular movements appear unhindered, blinks to threat and tends to both sides, face appears symmetric. Motor examination:  Difficulty raising the right arm up and keeping it up due to pain in the right shoulder.  Otherwise no other weakness noted. Sensation intact to touch Coordination difficult to assess due to aphasia    NIHSS 1A: Level of Consciousness - 0 1B: Ask Month and Age - 2 1C: 'Blink Eyes' & 'Squeeze Hands' - 0 2: Test Horizontal Extraocular Movements - 0 3: Test Visual Fields - 0 4: Test Facial Palsy - 0 5A: Test Left Arm Motor Drift - 0 5B: Test Right Arm Motor Drift - 2 6A: Test Left Leg Motor Drift - 0 6B: Test Right Leg Motor Drift - 0 7: Test Limb Ataxia - 0 8: Test Sensation - 0 9: Test Language/Aphasia- 2 10: Test Dysarthria - 0 11: Test Extinction/Inattention - 0 NIHSS score: 6   Imaging Reviewed: Stat CT head aspects 10.  No bleed.  Labs reviewed in epic and pertinent values follow: CBC    Component Value Date/Time   WBC 9.1 03/22/2021 0505   RBC 4.64 03/22/2021 0505   HGB 13.4 03/22/2021 0505   HGB 13.7 02/21/2018 0854   HCT 40.3  03/22/2021 0505   HCT 39.6 02/21/2018 0854   PLT 225 03/22/2021 0505   PLT 257 02/21/2018 0854   MCV 86.9 03/22/2021 0505   MCV 90 02/21/2018 0854   MCH 28.9 03/22/2021 0505   MCHC 33.3 03/22/2021 0505   RDW 13.9 03/22/2021 0505   RDW 13.2 02/21/2018 0854   LYMPHSABS 1.6 03/22/2021 0505   LYMPHSABS 1.8 02/21/2018 0854   MONOABS 0.8 03/22/2021 0505   EOSABS 0.1 03/22/2021 0505   EOSABS 0.2 02/21/2018 0854   BASOSABS 0.0 03/22/2021 0505   BASOSABS 0.0 02/21/2018 0854   CMP     Component Value Date/Time   NA 133 (L) 03/22/2021 0505   NA 139 02/21/2018 0854   K 3.1 (L) 03/22/2021 0505   CL 98 03/22/2021 0505   CO2 25 03/22/2021 0505   GLUCOSE 99 03/22/2021 0505   BUN 14 03/22/2021 0505   BUN 20 02/21/2018 0854   CREATININE 0.53 03/22/2021 0505   CREATININE 0.54 (L) 11/28/2015 1422   CALCIUM 9.1 03/22/2021 0505   PROT 7.7 03/15/2021 1925   ALBUMIN 4.7 03/15/2021 1925   AST 24 03/15/2021 1925   ALT 21 03/15/2021 1925    ALKPHOS 113 03/15/2021 1925   BILITOT 1.9 (H) 03/15/2021 1925   GFRNONAA >60 03/22/2021 0505   GFRAA 78 02/21/2018 0854     Assessment:  86 year old with above past medical history and recent exploratory laparotomy, history of atrial fibrillation with Eliquis held due to surgery with plans to resume today at 1 PM had sudden onset of unwitnessed fall followed by word finding difficulty and difficulty moving the right arm-mostly due to pain.  Code stroke was called due to the aphasia. On examination she does appear aphasic with an NIH stroke scale of 6. Not a candidate for intravenous thrombolysis due to recent surgery-although initially I ordered TN K, that ordered was quickly canceled as the chart was being reviewed and the history of recent surgery was noted. Patient has severe allergy that includes airway obstruction/shortness of breath with iodinated contrast hence CTA head and neck was not done. She was sent for emergent MRA head and neck which she is undergoing right now. Likely cardioembolic stroke in the setting of held anticoagulation  Recommendations:  Stat MRI brain, MRA head, MRA neck May be a candidate for thrombectomy if she has a large area at risk based on FLAIR/DWI mismatch. Will follow  Addendum MRI imaging reviewed-bihemispheric embolic looking strokes (LMCA worse than RMCA). MRA head/neck--patient initially refused. Then went through the imaging. MRA neck with no occlusion but MRA head with concern for missing RMCA branches, esp inferior division brainches. Delay in contacting family - multiple calls made that remained unanswered. Finally was able to talk to daughter. Explained high suspicion for LVO in Lt M1/M2 territory. Can attempt diagnostic cerebral angiogram and possible EVT if arteriogram shows clot. Detailed discussion on risk benefit on three way call - Dr. Keturah Barre, daughter and me. Daughter agreed. Emergent IR activated. Plan discussed with neurohospitalist on [facility]. Emergent transfer intiated to [facility]. Hospitalist at [facility] notified.  Due to iodinated contrast allergy-ordered Benadryl 50 mg IV x1 and Solu-Medrol 125 mg IV x1 to be given at [facility] long as she is awaiting transport to [facility]  Further management of contrast allergy related protocol at [facility] [phone]  This patient is receiving care for possible acute neurological changes. There was 80 minutes of care by this provider at the time  of service, including time for direct evaluation via telemedicine, review of medical records, imaging studies and discussion of findings with providers, the patient and/or family.  -- Amie Portland, MD [facility] Pager: [phone] If 7pm to 7am, please call on call as listed on AMION.  CRITICAL CARE ATTESTATION Performed by: Amie Portland, MD Total critical care time: 76 minutes Critical care time was exclusive of separately billable procedures and treating other patients and/or supervising APPs/Residents/Students Critical care was necessary to treat or prevent imminent or life-threatening deterioration due to AIS This patient is critically ill and at significant risk for neurological worsening and/or death and care requires constant monitoring. Critical care was time spent personally by me on the following activities: development of treatment plan with patient and/or surrogate as well as nursing, discussions with consultants, evaluation of patient's response to treatment, examination of patient, obtaining history from patient or surrogate, ordering and performing treatments and interventions, ordering and review of laboratory studies, ordering and review of radiographic studies, pulse oximetry, re-evaluation of patient's condition, participation in multidisciplinary rounds and medical decision making of high complexity in the care of this patient.

## 2021-03-22 NOTE — Procedures (Addendum)
S/P 4 vessel cerebral artreriogram Rt CFA approach  Findings. 1.Occluded angular branch of LT MCA inf division  in the M3/M4 region with robust collaterals. 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 3.Mod FMD changes in the mid third of RT ICA 4.Focal  2.9 mm out pouching of the Lt ICA in the PCOM region ,probable aneurysm. Marland Kitchen Extubated. PUPILS  Rt =Lt  EOMS full. No facial asymmetry. Tongue midline  Responds appropriately to simple commands.  Identifies 2 fingers. Dysarthric. Moves all 4s to command and spontaneously.  46F angioseal for RT CFA hemostasis. Distal pulses all dopplerable. S.Jeniffer Culliver MD

## 2021-03-22 NOTE — Sedation Documentation (Signed)
Patient transported to 4N ICU with CRNA Richard. 4[facility] staff at the bedside to receive patient. No intervention, diagnostic angio only. Groin site assessed. Pink, warm and dry. No drainage noted from dressing. Soft to touch upon palpation, no hematoma noted. Palpable +2 distal pulses intact. Patient is alert to name only, moving all extremities and following simple commands.

## 2021-03-22 NOTE — Progress Notes (Signed)
Chaplain engaged in prayer with Teyonna and her daughter before she was transferred by [facility].  Chaplain, along with other medical staff, offered support to them through this major and fast transition.  Chaplain and nurses were able to provide listening and compassionate presence.     03/22/21 1600  Clinical Encounter Type  Visited With Patient and family together  Visit Type Spiritual support  Referral From Family  Consult/Referral To Chaplain  Spiritual Encounters  Spiritual Needs Prayer;Emotional

## 2021-03-22 NOTE — Significant Event (Signed)
Rapid Response Event Note   Reason for Call : Called at 1412, Code Stroke-MD Arrien at bedside  Called by [facility] staff in regards to patient having stroke like symptoms. Upon arrival to patient's room, patient had unwitnessed fall at 1252. Last known well was approximately 1245. Arrived to bedside 1415. Patient was having expressive aphasia and right arm weakness. CBG at 1417 was 107. Called CT to notify needing STAT Head CT. Transported patient to CT. Notified AC, Dawn, to bring Tele Neuro cart from ED to CT scan.   Initial Focused Assessment:  Neuro: Alert, able to follow basic commands, aphasic expressively, right arm weakness, having  Pain on right arm possibly from fall.  Cardiac: Afib with controlled rate, HR 100s,  BP HTN see vital signs Pulmonary: RA, O2 Sats 100%, no signs respiratory distress.   Interventions:  CBG Obtained STAT CT of head Activated Code Stroke via [phone], and Tele neuro cart -MD Rory Percy with Tele Neuro-ordered MRI of head and MRA Transferred to ICU after MRI -STAT Transfer to [facility] for IR Thrombectomy    Plan of Care:  Transfer to [facility] ICU   Event Summary:   MD Notified: MD Rory Percy via Tele Neuro Call Time: [place] Time: 9476 End Time: [place], RN

## 2021-03-22 NOTE — Progress Notes (Signed)
Robin [name] [id number] 1935-03-27  CARE TEAM:  PCP: [name], PA-C  Outpatient Care Team: Patient Care Team: [name], PA-C as PCP - General (Family Medicine) Skeet Latch, MD as PCP - Cardiology (Cardiology) Milus Banister, MD as Attending Physician (Gastroenterology) [name], MD as Consulting Physician (General Surgery) Cari Caraway, MD as Consulting Physician (Family Medicine)  Inpatient Treatment Team: Treatment Team: Attending Provider: Tawni Millers, MD; Consulting Physician: Edison Pace, Md, MD; Rounding Team: Ian Bushman, MD; Social Worker: Merri Brunette; Consulting Physician: [name], MD; Registered Nurse: Shelton Silvas, RN; Pharmacist: Lynelle Doctor, [facility]; Technician: Beverly Gust, NT; Utilization Review: Rolland Porter, RN   Problem List:   Principal Problem:   SBO (small bowel obstruction) [facility]) Active Problems:   Cecal cancer & polyps s/p lap hemicolectomy 10/31/2020   Paroxysmal atrial fibrillation ([place])   Hypertension   5 Days Post-Op  03/17/2021  Pre-operative Diagnosis:  small bowel obstruction   Post-operative Diagnosis:  same   Surgeon:  [name], MD    Assessment  Postop ileus slowly resolving status post lysis of adhesions 03/17/2021 for a few bands causing a closed-loop obstruction  [facility] Stay = 7 days)  Plan:  Clear liquids with nasogastric tube clamping this morning.  If tolerates, remove nasogastric tube and advance to dysphagia 1/full liquid diet for lunch/dinner.  If worsening nausea, return to suction and try again   [place] IV fluids with as needed boluses as needed   Hypertension and other health issues per primary service  Mobilize.  Consider home health evaluation if needed.  Follow electrolytes.  Stable.  -VTE prophylaxis- SCDs, etc  -mobilize as tolerated to help recovery  Disposition:  Disposition:  The patient is from: Home  Anticipate discharge to:   Home with Home Health  Anticipated Date of Discharge is:  October 9,2022    Barriers to discharge:  Pending Clinical improvement (more likely than not)  Patient currently is NOT MEDICALLY STABLE for discharge from the hospital from a surgery standpoint.      30 minutes spent in review, evaluation, examination, counseling, and coordination of care.   I have reviewed this patient's available data, including medical history, events of note, physical examination and test results as part of my evaluation.  A significant portion of that time was spent in counseling.  Care during the described time interval was provided by me.  03/22/2021    Subjective: (Chief complaint)  Patient with large volume of diarrhea and bowel movements.  Feeling better after 4 bowel movements.  No nausea.  Tolerated nasogastric tube clamping.  Just been doing sips and ice chips low volume.  Walked in hallways  Objective:  Vital signs:  Vitals:   03/21/21 1408 03/21/21 1531 03/21/21 2009 03/22/21 0311  BP: (!) 175/99 (!) 150/87 (!) 166/81 (!) 155/95  Pulse: (!) 113 88 99 99  Resp: 18 20 20 20   Temp: 98 F (36.7 C) 99.1 F (37.3 C) 97.7 F (36.5 C) 97.7 F (36.5 C)  TempSrc: Oral Oral Oral Oral  SpO2: 96% 97% 95% 94%  Height:        Last BM Date: 03/22/21  Intake/Output   Yesterday:  No intake/output data recorded. This shift:  No intake/output data recorded.  Bowel function:  Flatus: YES  BM:  YES  Drain: Bilious - scant   Physical Exam:  General: Pt awake/alert in no acute distress Eyes: PERRL, normal EOM.  Sclera clear.  No icterus Neuro:  CN II-XII intact w/o focal sensory/motor deficits. Lymph: No head/neck/groin lymphadenopathy Psych:  No delerium/psychosis/paranoia.  Oriented x 4 HENT: Normocephalic, Mucus membranes moist.  No thrush.  NGT clamped.  Minimal hoarseness Neck: Supple, No tracheal deviation.  No obvious thyromegaly Chest: No pain to chest wall compression.   Good respiratory excursion.  No audible wheezing CV:  Pulses intact.  Regular rhythm.  No major extremity edema MS: Normal AROM mjr joints.  No obvious deformity  Abdomen: Soft.  Mildy distended.  Nontender.  Decision was staples with minimal ecchymosis.  Clean dry and intact.  No evidence of peritonitis.  No incarcerated hernias.  Ext:  No deformity.  No mjr edema.  No cyanosis Skin: No petechiae / purpurea.  No major sores.  Warm and dry    Results:   Cultures: Recent Results (from the past 720 hour(s))  Resp Panel by RT-PCR (Flu A&B, Covid) Nasopharyngeal Swab     Status: None   Collection Time: 03/15/21  7:21 PM   Specimen: Nasopharyngeal Swab; Nasopharyngeal(NP) swabs in vial transport medium  Result Value Ref Range Status   SARS Coronavirus 2 by RT PCR NEGATIVE NEGATIVE Final    Comment: (NOTE) SARS-CoV-2 target nucleic acids are NOT DETECTED.  The SARS-CoV-2 RNA is generally detectable in upper respiratory specimens during the acute phase of infection. The lowest concentration of SARS-CoV-2 viral copies this assay can detect is 138 copies/mL. A negative result does not preclude SARS-Cov-2 infection and should not be used as the sole basis for treatment or other patient management decisions. A negative result may occur with  improper specimen collection/handling, submission of specimen other than nasopharyngeal swab, presence of viral mutation(s) within the areas targeted by this assay, and inadequate number of viral copies(<138 copies/mL). A negative result must be combined with clinical observations, patient history, and epidemiological information. The expected result is Negative.  Fact Sheet for Patients:  EntrepreneurPulse.com.au  Fact Sheet for Healthcare Providers:  IncredibleEmployment.be  This test is no t yet approved or cleared by the [place] FDA and  has been authorized for detection and/or diagnosis of SARS-CoV-2  by FDA under an Emergency Use Authorization (EUA). This EUA will remain  in effect (meaning this test can be used) for the duration of the COVID-19 declaration under Section 564(b)(1) of the Act, 21 U.S.C.section 360bbb-3(b)(1), unless the authorization is terminated  or revoked sooner.       Influenza A by PCR NEGATIVE NEGATIVE Final   Influenza B by PCR NEGATIVE NEGATIVE Final    Comment: (NOTE) The Xpert Xpress SARS-CoV-2/FLU/RSV plus assay is intended as an aid in the diagnosis of influenza from Nasopharyngeal swab specimens and should not be used as a sole basis for treatment. Nasal washings and aspirates are unacceptable for Xpert Xpress SARS-CoV-2/FLU/RSV testing.  Fact Sheet for Patients: EntrepreneurPulse.com.au  Fact Sheet for Healthcare Providers: IncredibleEmployment.be  This test is not yet approved or cleared by the [place] FDA and has been authorized for detection and/or diagnosis of SARS-CoV-2 by FDA under an Emergency Use Authorization (EUA). This EUA will remain in effect (meaning this test can be used) for the duration of the COVID-19 declaration under Section 564(b)(1) of the Act, 21 U.S.C. section 360bbb-3(b)(1), unless the authorization is terminated or revoked.  Performed at [facility], [place] [address] [place], [place] [address]   Surgical pcr screen     Status: None   Collection Time: 03/17/21 10:08 AM   Specimen: Nasal Mucosa; Nasal Swab  Result Value  Ref Range Status   MRSA, PCR NEGATIVE NEGATIVE Final   Staphylococcus aureus NEGATIVE NEGATIVE Final    Comment: (NOTE) The Xpert SA Assay (FDA approved for NASAL specimens in patients 73 years of age and older), is one component of a comprehensive surveillance program. It is not intended to diagnose infection nor to guide or monitor treatment. Performed at [facility], [place] [address], [place], [place] [address]      Labs: Results for orders placed or performed during the hospital encounter of 03/15/21 (from the past 48 hour(s))  Basic metabolic panel     Status: Abnormal   Collection Time: 03/21/21  5:10 AM  Result Value Ref Range   Sodium 134 (L) 135 - 145 mmol/L   Potassium 4.1 3.5 - 5.1 mmol/L   Chloride 103 98 - 111 mmol/L   CO2 25 22 - 32 mmol/L   Glucose, Bld 101 (H) 70 - 99 mg/dL    Comment: Glucose reference range applies only to samples taken after fasting for at least 8 hours.   BUN 9 8 - 23 mg/dL   Creatinine, Ser 0.53 0.44 - 1.00 mg/dL   Calcium 9.5 8.9 - 10.3 mg/dL   GFR, Estimated >60 >60 mL/min    Comment: (NOTE) Calculated using the CKD-EPI Creatinine Equation (2021)    Anion gap 6 5 - 15    Comment: Performed at [facility], [place] [address], [place], [place] [address]  CBC with Differential/Platelet     Status: None   Collection Time: 03/21/21  5:10 AM  Result Value Ref Range   WBC 8.8 4.0 - 10.5 K/uL   RBC 4.80 3.87 - 5.11 MIL/uL   Hemoglobin 13.8 12.0 - 15.0 g/dL   HCT 42.5 36.0 - 46.0 %   MCV 88.5 80.0 - 100.0 fL   MCH 28.8 26.0 - 34.0 pg   MCHC 32.5 30.0 - 36.0 g/dL   RDW 13.9 11.5 - 15.5 %   Platelets 252 150 - 400 K/uL   nRBC 0.0 0.0 - 0.2 %   Neutrophils Relative % 70 %   Neutro Abs 6.1 1.7 - 7.7 K/uL   Lymphocytes Relative 20 %   Lymphs Abs 1.8 0.7 - 4.0 K/uL   Monocytes Relative 8 %   Monocytes Absolute 0.7 0.1 - 1.0 K/uL   Eosinophils Relative 2 %   Eosinophils Absolute 0.2 0.0 - 0.5 K/uL   Basophils Relative 0 %   Basophils Absolute 0.0 0.0 - 0.1 K/uL   Immature Granulocytes 0 %   Abs Immature Granulocytes 0.03 0.00 - 0.07 K/uL    Comment: Performed at [facility], [place] [address] [place], [place] [address]  Magnesium     Status: None   Collection Time: 03/21/21  5:10 AM  Result Value Ref Range   Magnesium 1.8 1.7 - 2.4 mg/dL    Comment: Performed at [facility], [place] [address]  [place], [place] [address]  Basic metabolic panel     Status: Abnormal   Collection Time: 03/22/21  5:05 AM  Result Value Ref Range   Sodium 133 (L) 135 - 145 mmol/L   Potassium 3.1 (L) 3.5 - 5.1 mmol/L    Comment: DELTA CHECK NOTED   Chloride 98 98 - 111 mmol/L   CO2 25 22 - 32 mmol/L   Glucose, Bld 99 70 - 99 mg/dL    Comment: Glucose reference range applies only to samples taken after fasting for at least  8 hours.   BUN 14 8 - 23 mg/dL   Creatinine, Ser 0.53 0.44 - 1.00 mg/dL   Calcium 9.1 8.9 - 10.3 mg/dL   GFR, Estimated >60 >60 mL/min    Comment: (NOTE) Calculated using the CKD-EPI Creatinine Equation (2021)    Anion gap 10 5 - 15    Comment: Performed at [facility], [place] [address] [place], [place] [address]  CBC with Differential/Platelet     Status: None   Collection Time: 03/22/21  5:05 AM  Result Value Ref Range   WBC 9.1 4.0 - 10.5 K/uL   RBC 4.64 3.87 - 5.11 MIL/uL   Hemoglobin 13.4 12.0 - 15.0 g/dL   HCT 40.3 36.0 - 46.0 %   MCV 86.9 80.0 - 100.0 fL   MCH 28.9 26.0 - 34.0 pg   MCHC 33.3 30.0 - 36.0 g/dL   RDW 13.9 11.5 - 15.5 %   Platelets 225 150 - 400 K/uL   nRBC 0.0 0.0 - 0.2 %   Neutrophils Relative % 73 %   Neutro Abs 6.5 1.7 - 7.7 K/uL   Lymphocytes Relative 17 %   Lymphs Abs 1.6 0.7 - 4.0 K/uL   Monocytes Relative 9 %   Monocytes Absolute 0.8 0.1 - 1.0 K/uL   Eosinophils Relative 1 %   Eosinophils Absolute 0.1 0.0 - 0.5 K/uL   Basophils Relative 0 %   Basophils Absolute 0.0 0.0 - 0.1 K/uL   Immature Granulocytes 0 %   Abs Immature Granulocytes 0.04 0.00 - 0.07 K/uL    Comment: Performed at [facility], [place] [address] [place], [place] [address]  Magnesium     Status: None   Collection Time: 03/22/21  5:05 AM  Result Value Ref Range   Magnesium 1.7 1.7 - 2.4 mg/dL    Comment: Performed at [facility], [place] [address] [place], [place] [address]    Imaging / Studies: No results  found.  Medications / Allergies: per chart  Antibiotics: Anti-infectives (From admission, onward)    Start     Dose/Rate Route Frequency Ordered Stop   03/17/21 1200  ertapenem (INVANZ) 1,000 mg in sodium chloride 0.9 % 100 mL IVPB        1 g 200 mL/hr over 30 Minutes Intravenous On call to O.R. 03/17/21 0936 03/17/21 1307         Note: Portions of this report may have been transcribed using voice recognition software. Every effort was made to ensure accuracy; however, inadvertent computerized transcription errors may be present.   Any transcriptional errors that result from this process are unintentional.    Adin Hector, MD, FACS, MASCRS Esophageal, Gastrointestinal & Colorectal Surgery Robotic and Minimally Invasive Surgery  [facility], [place] [address], [place], [place] [address] [phone] Fax [phone] Main  CONTACT INFORMATION:  Weekday (9AM-5PM): Call [facility] main office at [phone]  Weeknight (5PM-9AM) or Weekend/Holiday: Check [email or web address] (password " [email or web address]") for General Surgery [facility] coverage  (Please, do not use SecureChat as it is not reliable communication to operating surgeons for immediate patient care)      03/22/2021  8:18 AM

## 2021-03-22 NOTE — Anesthesia Postprocedure Evaluation (Signed)
Anesthesia Post Note  Patient: Robin [name]  Procedure(s) Performed: IR WITH ANESTHESIA     Patient location during evaluation: PACU Anesthesia Type: General Level of consciousness: awake Pain management: pain level controlled Vital Signs Assessment: post-procedure vital signs reviewed and stable Respiratory status: spontaneous breathing Cardiovascular status: stable Postop Assessment: no apparent nausea or vomiting Anesthetic complications: no   No notable events documented.  Last Vitals:  Vitals:   03/22/21 2045 03/22/21 2100  BP:  123/67  Pulse: 94 (!) 102  Resp: 18 17  Temp:    SpO2: 93% 94%    Last Pain:  Vitals:   03/22/21 2000  TempSrc: Oral  PainSc: 0-No pain                 Damyia Strider

## 2021-03-22 NOTE — Progress Notes (Signed)
Patient sustained a fall in the bathroom, unclear about head trauma.  After moving her to her back to her bed she complained of right shoulder pain.  She was noted to have difficulty finding words.  No weakness in her upper or lower extremities.  Code stroke called and patient was transported to CT/MRI.  Acute stroke was confirmed, patient will be transferred to [facility] for further neurological care.

## 2021-03-22 NOTE — Sedation Documentation (Signed)
Sean-Bed Control notified of active code stroke on the table

## 2021-03-22 NOTE — Transfer of Care (Addendum)
Immediate Anesthesia Transfer of Care Note  Patient: Margareth E Gertsch  Procedure(s) Performed: IR WITH ANESTHESIA  Patient Location: ICU  Anesthesia Type:General  Level of Consciousness: awake, alert  and patient cooperative  Airway & Oxygen Therapy: Patient Spontanous Breathing and Patient connected to face mask oxygen  Post-op Assessment: Report given to RN and Post -op Vital signs reviewed and stable  Post vital signs: Reviewed and stable  Last Vitals:  Vitals Value Taken Time  BP 165/87   Temp    Pulse 97 03/22/21 1932  Resp 17 03/22/21 1932  SpO2 94 % 03/22/21 1932  Vitals shown include unvalidated device data.  Last Pain:  Vitals:   03/22/21 1815  TempSrc: Oral  PainSc: 0-No pain      Patients Stated Pain Goal: 2 (91/79/15 0569)  Complications: No notable events documented.

## 2021-03-22 NOTE — Progress Notes (Addendum)
PROGRESS NOTE    Robin [name]  YDX:[id number] DOB: October 14, 1934 DOA: 03/15/2021 PCP: [name], PA-C    Brief Narrative:  Robin [name] was admitted to the hospital with the working diagnosis of small bowel obstruction due to adhesions Now sp exploratory laparotomy with lysis of adhesions.    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  She underwent hemicolectomy with complete resection of stage I colon carcinoma.  She had a prolonged hospitalization, postoperative ileus requiring TPN. This time patient reported recurrent nausea and vomiting along with abdominal pain.  Her blood pressure was 182/96, heart rate 112, respiratory rate 18, oxygen saturation 98%, her lungs were clear to auscultation bilaterally, heart S1-S2, present, rhythmic, soft abdomen, tender to palpation diffusely, no lower extremity edema.   Sodium 138, potassium 3.4, chloride 98, bicarb 27, glucose 129, BUN 18, creatinine 0.71, lactic acid 1.8, white count 9.4, hemoglobin 16.0, hematocrit 48.7, platelets 315.  SARS COVID-19 negative.   Chest radiograph with hyperinflation, tenting right hemidiaphragm.  No infiltrates.  Positive air-fluid levels in the abdomen.   CT of the abdomen pelvis with small bowel obstruction possibly representing a closed-loop obstruction secondary to internal hernia.  Extensive mesenteric edema and trace ascites.   EKG 101 bpm,, normal axis, normal QTC, atrial fibrillation rhythm, no significant ST segment or T wave changes, positive PVCs.   Patient received supportive medical therapy including nasogastric tube for intermittent suction. Patient failed to improve supportive care, underwent intraoperative lysis of adhesions 9/30. Developed postoperative ileus.   Assessment & Plan:   Principal Problem:   SBO (small bowel obstruction) (HCC) Active Problems:   Paroxysmal atrial fibrillation  (H[facility])   Cecal cancer & polyps s/p lap hemicolectomy 10/31/2020   Hypertension     Small bowel obstruction due to adhesions, now sp exploratory laparotomy and lysis of adhesions. Post operative ileus.  NG tube has been removed today, after patient tolerating liquids with clamped tube. Positive bowel movement last night.   Discontinue IV fluids, continue to advance diet as tolerated per surgery recommendations.  Pain control with as needed hydromorphone  Out of bed to chair tid with meals. PT and OT.    2. Paroxysmal atrial fibrillation.  Resume metoprolol succinate 100 mg po daily and anticoagulation with apixaban. Continue telemetry monitoring.    3. HTN.  Resumed metoprolol. Continue blood pressure monitoring.    4. Cecal cancer sp hemicolectomy in 10/2020.  Outpatient follow up as scheduled.    5. Stage 1 sacrum pressure ulcer. Not present on admission. Continue local skin care   6. Hyponatremia, hypokalemia and hypomagnesemia. Na is 133, K is 3,1 and Mg 1,7, renal function with serum cr at 0,53 and bicarbonate at 25.  Add 80 meq Kcl in 2 divided doses and 2 g Mag sulfate. Follow up electrolytes in am. Discontinue IV fluids.   Status is: Inpatient  Remains inpatient appropriate because:Inpatient level of care appropriate due to severity of illness  Dispo: The patient is from: Home              Anticipated d/c is to: Home              Patient currently is not medically stable to d/c.   Difficult to place patient No  DVT prophylaxis: Apixaban   Code Status:   full  Family Communication:  No family at the bedside     Consultants:  Surgery   Procedures:   Exploratory  laparotomy with lysis of adhesions     Subjective: Patient is feeling better, no nausea or vomiting, no abdominal pain, NG tube has been removed,. Positive bowel movement last night and during the day today.   Objective: Vitals:   03/21/21 1531 03/21/21 2009 03/22/21 0311 03/22/21 0856  BP: (!)  150/87 (!) 166/81 (!) 155/95 (!) 167/89  Pulse: 88 99 99   Resp: 20 20 20 20   Temp: 99.1 F (37.3 C) 97.7 F (36.5 C) 97.7 F (36.5 C) 97.8 F (36.6 C)  TempSrc: Oral Oral Oral Oral  SpO2: 97% 95% 94% 95%  Height:       No intake or output data in the 24 hours ending 03/22/21 1205 There were no vitals filed for this visit.  Examination:   General: Not in pain or dyspnea, deconditioned  Neurology: Awake and alert, non focal  E ENT: mild pallor, no icterus, oral mucosa moist Cardiovascular: No JVD. S1-S2 present, rhythmic, no gallops, rubs, or murmurs. No lower extremity edema. Pulmonary: positive breath sounds bilaterally, adequate air movement, no wheezing, rhonchi or rales. Gastrointestinal. Abdomen soft and non tender to superficial palpation. Not distended Skin. Left upper extremity ecchymosis.  Musculoskeletal: no joint deformities     Data Reviewed: I have personally reviewed following labs and imaging studies  CBC: Recent Labs  Lab 03/18/21 0552 03/19/21 0537 03/20/21 0445 03/21/21 0510 03/22/21 0505  WBC 18.2* 13.6* 11.0* 8.8 9.1  NEUTROABS 15.7* 10.6* 8.5* 6.1 6.5  HGB 14.3 13.7 14.2 13.8 13.4  HCT 42.6 42.0 43.4 42.5 40.3  MCV 86.4 88.8 87.5 88.5 86.9  PLT 279 251 235 252 081   Basic Metabolic Panel: Recent Labs  Lab 03/18/21 0552 03/19/21 0537 03/20/21 0445 03/21/21 0510 03/22/21 0505  NA 137 135 131* 134* 133*  K 4.2 4.1 3.7 4.1 3.1*  CL 103 101 101 103 98  CO2 29 25 26 25 25   GLUCOSE 177* 122* 131* 101* 99  BUN 16 12 6* 9 14  CREATININE 0.59 0.39* 0.35* 0.53 0.53  CALCIUM 9.4 8.9 8.9 9.5 9.1  MG 2.2 2.0 1.9 1.8 1.7   GFR: CrCl cannot be calculated (Unknown ideal weight.). Liver Function Tests: Recent Labs  Lab 03/15/21 1925  AST 24  ALT 21  ALKPHOS 113  BILITOT 1.9*  PROT 7.7  ALBUMIN 4.7   No results for input(s): LIPASE, AMYLASE in the last 168 hours. No results for input(s): AMMONIA in the last 168 hours. Coagulation  Profile: No results for input(s): INR, PROTIME in the last 168 hours. Cardiac Enzymes: No results for input(s): CKTOTAL, CKMB, CKMBINDEX, TROPONINI in the last 168 hours. BNP (last 3 results) No results for input(s): PROBNP in the last 8760 hours. HbA1C: No results for input(s): HGBA1C in the last 72 hours. CBG: No results for input(s): GLUCAP in the last 168 hours. Lipid Profile: No results for input(s): CHOL, HDL, LDLCALC, TRIG, CHOLHDL, LDLDIRECT in the last 72 hours. Thyroid Function Tests: No results for input(s): TSH, T4TOTAL, FREET4, T3FREE, THYROIDAB in the last 72 hours. Anemia Panel: No results for input(s): VITAMINB12, FOLATE, FERRITIN, TIBC, IRON, RETICCTPCT in the last 72 hours.    Radiology Studies: I have reviewed all of the imaging during this hospital visit personally     Scheduled Meds:  acetaminophen  1,000 mg Oral Q6H   Chlorhexidine Gluconate Cloth  6 each Topical Daily   enoxaparin (LOVENOX) injection  40 mg Subcutaneous Q24H   lip balm  1 application Topical BID  magic mouthwash  10 mL Oral TID   metoprolol tartrate  5 mg Intravenous BID   pantoprazole (PROTONIX) IV  40 mg Intravenous QHS   Continuous Infusions:  lactated ringers 50 mL/hr at 03/22/21 0103   methocarbamol (ROBAXIN) IV 1,000 mg (03/22/21 0506)     LOS: 7 days        Kanyon Bunn Gerome Apley, MD

## 2021-03-22 NOTE — Sedation Documentation (Signed)
Per Dr. Estanislado Pandy, diagnostic angiogram only, no intervention at this time

## 2021-03-22 NOTE — Progress Notes (Signed)
   03/22/21 1100  Mobility  Activity Ambulated in hall;Ambulated to bathroom  Level of Assistance Standby assist, set-up cues, supervision of patient - no hands on  Assistive Device Front wheel walker  Distance Ambulated (ft) 150 ft  Mobility Ambulated with assistance in hallway;Out of bed for toileting  Mobility Response Tolerated well  Mobility performed by Mobility specialist  $Mobility charge 1 Mobility   Pt agreeable to mobilizing today. Pt mentioned she was concerned about her HR secondary to yesterday's session with PT/OT. Continuously monitored her O2 levels and bpm. She sat EOB and practiced pursed lip breathing prior to mobilizing. Ambulated about 12ft in hallway with RW, tolerated well. Took 2 rest breaks and practiced breathing techniques to stabilize HR. Upon returning, pt stated she needed to use the restroom. Left pt in restroom, and instructed her to pull call bell when she was ready to return to bed. RN notified of session.  Mobility Specialist - Progress Note Pre-mobility: HR 93, SpO2 97% During mobility: HR 106-95, SpO2 96% Post-mobility: HR 94, SPO2 96%     Rudell Cobb Mobility Specialist Acute Rehab Services Office: [phone]

## 2021-03-22 NOTE — Progress Notes (Signed)
Patient arrived to unit at 9201 with no complications. No belongings at bedside.

## 2021-03-22 NOTE — H&P (Signed)
Stroke Neurology Admission Note  The history was obtained from the medical chart.  During history and examination, all items were able to obtain unless otherwise noted.  History of Present Illness:  Robin [name] is a 86 y.o. Caucasian female with PMH of HTN, Afib on eliquis, GIB due to colon cancer stage 1 s/p hemicolectomy with post op ileus admitted to [facility] for abdominal pain and N/V. Her eliquis was on hold. Found to have small bowel obstruction, s/p exploratory laparotomy and intraoperative lysis of adhesions on 03/17/21. She again had post op lieus and surgery is following.   She was walking with PT OT this afternoon at 12:45pm but she was found on the floor at 12:52pm with aphasia and right arm weakness (pt does have right shoulder pain). Code stroke activated and CT no acute finding. Not able to have CTA done due to allergy to contrast. MRI done showed left MCA parietal small cortical and subcortical infarct. Right posterior parietal WM infarct. MRA showed left MCA M1 high grade stenosis with M2 superior division occlusion. Right M2 possible occlusion. Family consented for thrombectomy. Pt was sent to [facility] for cerebral angio and intention to treat with thrombectomy.   LSN: 12:45 pm tPA Given: No: recent surgery mRS = 3 IR - yes  Past Medical History:  Diagnosis Date   Anxiety    AV bloc first degree    Carotid stenosis 03/02/2017   Hyperlipidemia    Hypertension    Lower extremity edema 12/30/2020   Lumbar pain    fell off of her horse and has low back pain at times   Persistent atrial fibrillation ([place]) 12/30/2020   Venous insufficiency of both lower extremities    Vertigo     Past Surgical History:  Procedure Laterality Date   ABDOMINAL HYSTERECTOMY     BIOPSY  10/28/2020   Procedure: BIOPSY;  Surgeon: Milus Banister, MD;  Location: [facility];  Service: Endoscopy;;   BREAST BIOPSY Left    BREAST EXCISIONAL BIOPSY Right    BREAST LUMPECTOMY WITH RADIOACTIVE SEED  LOCALIZATION Left 05/05/2015   Procedure: LEFT BREAST LUMPECTOMY WITH RADIOACTIVE SEED LOCALIZATION;  Surgeon: Autumn Messing III, MD;  Location: [facility];  Service: General;  Laterality: Left;   CHOLECYSTECTOMY     COLON RESECTION N/A 10/31/2020   Procedure: LAPAROSCOPIC RIGHT COLON RESECTION, lysis of adhesions, hemicolectomy, bilateral tap block;  Surgeon: [name], MD;  Location: [facility];  Service: General;  Laterality: N/A;   COLONOSCOPY WITH PROPOFOL N/A 10/28/2020   Procedure: COLONOSCOPY WITH PROPOFOL;  Surgeon: Milus Banister, MD;  Location: [facility];  Service: Endoscopy;  Laterality: N/A;   EYE SURGERY     bil cataract surgery   LAPAROTOMY N/A 03/17/2021   Procedure: EXPLORATORY LAPAROTOMY/LYSIS OF ADHESIONS;  Surgeon: [name], MD;  Location: [facility];  Service: General;  Laterality: N/A;   LUNG SURGERY     POLYPECTOMY  10/28/2020   Procedure: POLYPECTOMY;  Surgeon: Milus Banister, MD;  Location: [facility];  Service: Endoscopy;;   SUBMUCOSAL TATTOO INJECTION  10/28/2020   Procedure: SUBMUCOSAL TATTOO INJECTION;  Surgeon: Milus Banister, MD;  Location: [facility];  Service: Endoscopy;;    Family History  Problem Relation Age of Onset   Hypertension Mother    Stroke Mother    Heart disease Mother    Heart disease Father    Hypertension Sister    Cancer Sister    Hypertension Brother    Hypertension Sister  Hypertension Sister    Breast cancer Neg Hx     Social History:  reports that she has never smoked. She has never used smokeless tobacco. She reports current alcohol use. She reports that she does not use drugs.  Allergies:  Allergies  Allergen Reactions   Iodinated Diagnostic Agents Shortness Of Breath   Amlodipine     Shortness of breath   Codeine Nausea Only    Tolerates Dilaudid fine   Diphenhydramine Hcl     Hypertension    Pravastatin     Muscle aches   Zetia [Ezetimibe]    Sulfa Antibiotics Rash   Sulfa Drugs Cross  Reactors Rash    No current facility-administered medications on file prior to encounter.   Current Outpatient Medications on File Prior to Encounter  Medication Sig Dispense Refill   acetaminophen (TYLENOL) 325 MG tablet Take 2 tablets (650 mg total) by mouth every 6 (six) hours as needed for mild pain, fever or headache.     Cholecalciferol (VITAMIN D) 50 MCG (2000 UT) tablet Take 2,000 Units by mouth at bedtime.     diphenoxylate-atropine (LOMOTIL) 2.5-0.025 MG tablet Take 1 tablet by mouth every 12 (twelve) hours as needed for diarrhea or loose stools (for diarrhea). 30 tablet 10   ELIQUIS 5 MG TABS tablet TAKE 1 TABLET BY MOUTH TWICE DAILY. 180 tablet 1   estradiol (ESTRACE) 1 MG tablet Take 0.5 mg by mouth daily.     ferrous sulfate 325 (65 FE) MG tablet Take 1 tablet (325 mg total) by mouth 2 (two) times daily with a meal. (Patient taking differently: Take 325 mg by mouth at bedtime.) 60 tablet 0   Lactobacillus Rhamnosus, GG, (CULTURELLE PO) Take 1 capsule by mouth in the morning.     Lutein 20 MG CAPS Take 1 capsule by mouth every evening.     metoprolol succinate (TOPROL-XL) 50 MG 24 hr tablet Take 0.5-2 tablets (25-100 mg total) by mouth 2 (two) times daily. Take 100mg  in am and 25mg  in evening (Patient taking differently: Take 25-100 mg by mouth 2 (two) times daily. Take 100mg  by mouth in morning and 25mg  in evening as needed for heart rate above 90 BPM) 60 tablet 0   mometasone (ELOCON) 0.1 % lotion Apply 1 drop topically at bedtime. To forehead     ondansetron (ZOFRAN ODT) 4 MG disintegrating tablet Take 1 tablet (4 mg total) by mouth every 8 (eight) hours as needed for nausea or vomiting. 20 tablet 0   polycarbophil (FIBERCON) 625 MG tablet Take 1 tablet (625 mg total) by mouth 2 (two) times daily. 60 tablet 10   potassium chloride SA (KLOR-CON) 20 MEQ tablet Take 1 tablet (20 mEq total) by mouth 2 (two) times daily.     TIADYLT ER 180 MG 24 hr capsule TAKE (1) CAPSULE DAILY.  (Patient taking differently: Take 180 mg by mouth daily.) 90 capsule 2   triamcinolone cream (KENALOG) 0.1 % Apply 1 application topically daily as needed (for rash).      triamterene-hydrochlorothiazide (MAXZIDE-25) 37.5-25 MG tablet Take 1 tablet by mouth daily.     vitamin A 10000 UNIT capsule Take 10,000 Units by mouth daily.      Review of Systems: A full ROS was attempted today and was not able to be performed due to aphasia.  Physical Examination: Temp:  [97.6 F (36.4 C)-97.8 F (36.6 C)] 97.7 F (36.5 C) (10/05 1337) Pulse Rate:  [99-119] 119 (10/05 1604) Resp:  [17-20] 17 (10/05  1604) BP: (111-197)/(62-110) 197/110 (10/05 1604) SpO2:  [94 %-100 %] 100 % (10/05 1604)  General - well nourished, well developed, in no apparent distress.    Ophthalmologic - fundi not visualized due to noncooperation.    Cardiovascular - irregularly irregular heart rate and rhythm.  Neuro - awake, alert, eyes open, word salad, not able to answer orientation questions. No dysarthria but word salad, fluent aphasia. Able to follow eye open closure and mouth open closure but not following peripheral commands. Not able to name or repeat. No gaze palsy, tracking bilaterally, not blinking to visual threat consistently bilaterally, PERRL. No facial droop. Tongue midline. LUE no drift, RUL drift but pt grimace seems in pain with right shoulder. BLE symmetrical at least 3/5. Sensation, coordination not cooperative and gait not tested. Marland Kitchen  NIH Stroke Scale  Level Of Consciousness 0=Alert; keenly responsive 1=Arouse to minor stimulation 2=Requires repeated stimulation to arouse or movements to pain 3=postures or unresponsive 0  LOC Questions to Month and Age 32=Answers both questions correctly 1=Answers one question correctly or dysarthria/intubated/trauma/language barrier 2=Answers neither question correctly or aphasia 2  LOC Commands      -Open/Close eyes     -Open/close grip     -Pantomime commands if  communication barrier 0=Performs both tasks correctly 1=Performs one task correctly 2=Performs neighter task correctly 1  Best Gaze     -Only assess horizontal gaze 0=Normal 1=Partial gaze palsy 2=Forced deviation, or total gaze paresis 0  Visual 0=No visual loss 1=Partial hemianopia 2=Complete hemianopia 3=Bilateral hemianopia (blind including cortical blindness) 0  Facial Palsy     -Use grimace if obtunded 0=Normal symmetrical movement 1=Minor paralysis (asymmetry) 2=Partial paralysis (lower face) 3=Complete paralysis (upper and lower face) 0  Motor  0=No drift for 10/5 seconds 1=Drift, but does not hit bed 2=Some antigravity effort, hits  bed 3=No effort against gravity, limb falls 4=No movement 0=Amputation/joint fusion Right Arm 2     Leg 0    Left Arm 0     Leg 0  Limb Ataxia     - FNT/HTS 0=Absent or does not understand or paralyzed or amputation/joint fusion 1=Present in one limb 2=Present in two limbs 0  Sensory 0=Normal 1=Mild to moderate sensory loss 2=Severe to total sensory loss or coma/unresponsive 0  Best Language 0=No aphasia, normal 1=Mild to moderate aphasia 2=Severe aphasia 3=Mute, global aphasia, or coma/unresponsive 2  Dysarthria 0=Normal 1=Mild to moderate 2=Severe, unintelligible or mute/anarthric 0=intubated/unable to test 1  Extinction/Neglect 0=No abnormality 1=visual/tactile/auditory/spatia/personal inattention/Extinction to bilateral simultaneous stimulation 2=Profound neglect/extinction more than 1 modality  0  Total   8     Data Reviewed: CT ABDOMEN PELVIS WO CONTRAST  Result Date: 03/15/2021 CLINICAL DATA:  Intractable nausea and vomiting EXAM: CT ABDOMEN AND PELVIS WITHOUT CONTRAST TECHNIQUE: Multidetector CT imaging of the abdomen and pelvis was performed following the standard protocol without IV contrast. COMPARISON:  11/23/2020 FINDINGS: Lower chest: The visualized lung bases are clear. The visualized heart and pericardium are  unremarkable. Hepatobiliary: No focal liver abnormality is seen. Status post cholecystectomy. No biliary dilatation. Pancreas: Unremarkable Spleen: Unremarkable Adrenals/Urinary Tract: The adrenal glands are unremarkable. The kidneys are normal in size and position. Exophytic 10 cm simple cortical cyst arises from the lower pole the right kidney, unchanged. The kidneys are otherwise unremarkable. The bladder is unremarkable. Stomach/Bowel: Surgical changes of right hemicolectomy are again identified. There are numerous loops of dilated fluid-filled small bowel involving the mid jejunum with decompressed distal colon in keeping with a small-bowel obstruction.  Moreover, there are 2 apparent points of transition with bowel pulled across midline into the right upper quadrant, best seen on axial image # 40/2 and 41/2. Moreover, there is focal narrowing of the superior mesenteric venous branches, best seen on image # 42/2 and whorled tethering of the small bowel, best seen on image # 44/2. Altogether, the findings are suggestive of an internal hernia. There is marked mesenteric edema involving the affected segment of mid small bowel suggestive of a closed loop obstruction. Mild ascites is present. No free intraperitoneal gas. No pneumatosis. Previously noted region of peritoneal fat necrosis anterior and superior to the bladder has resolved. Vascular/Lymphatic: Extensive aortoiliac atherosclerotic calcification. No aortic aneurysm. No pathologic adenopathy within the abdomen and pelvis. Reproductive: Status post hysterectomy. No adnexal masses. Other: No abdominal wall hernia.  Rectum unremarkable. Musculoskeletal: No acute bone abnormality. Osseous structures are diffusely osteopenic. Compression deformities of T12, L2, and L3 are unchanged. No lytic or blastic bone lesion. IMPRESSION: Findings in keeping with a mid small bowel obstruction, possibly representing a closed loop obstruction secondary to internal hernia, as  described above. No pneumatosis or free intraperitoneal gas. Extensive mesenteric edema and trace ascites present. Surgical consultation is advised. Resolved area of fat necrosis noted on prior examination. Stable 10 cm simple exophytic cortical cyst arising from the lower pole the right kidney. Aortic Atherosclerosis (ICD10-I70.0). Electronically Signed   By: Fidela Salisbury M.D.   On: 03/15/2021 21:27   DG Abd 1 View  Result Date: 03/17/2021 CLINICAL DATA:  Small bowel obstruction EXAM: ABDOMEN - 1 VIEW COMPARISON:  03/16/2021 FINDINGS: NG tube tip is in the proximal stomach with the side port in the distal esophagus, unchanged. Dilated lower abdominal small bowel loops are unchanged. No free air or organomegaly. Prior cholecystectomy. IMPRESSION: No significant change. Electronically Signed   By: Rolm Baptise M.D.   On: 03/17/2021 07:12   MR ANGIO HEAD WO CONTRAST  Result Date: 03/22/2021 CLINICAL DATA:  Neuro deficit, acute, stroke suspected. EXAM: MRI HEAD WITHOUT CONTRAST TECHNIQUE: Multiplanar, multiecho pulse sequences of the brain and surrounding structures were obtained without intravenous contrast. COMPARISON:  Head CT same day FINDINGS: Brain: Diffusion imaging shows an approximally 3 cm region of acute infarction affecting the cortical and subcortical brain of the left parietal lobe. No swelling or hemorrhage visible at this time. There is a focus of what probably represents T2 shine through within the right posterior parietal subcortical white matter. The brainstem appears normal. No focal cerebellar finding. Cerebral hemispheres show moderate chronic small-vessel ischemic changes throughout the deep and subcortical white matter. No mass, hydrocephalus or extra-axial collection. Vascular: Major vessels at the base of the brain show flow. Skull and upper cervical spine: Negative Sinuses/Orbits: Clear/normal Other: None IMPRESSION: Acute infarction affecting a 3 cm region of the cortical and  subcortical brain in the left parietal area. No mass effect or hemorrhage at this time. Probable focus of T2 shine through within the right posterior parietal white matter. Moderate chronic small-vessel ischemic changes of the cerebral hemispheric white matter diffusely. Electronically Signed   By: Nelson Chimes M.D.   On: 03/22/2021 16:18   MR BRAIN WO CONTRAST  Result Date: 03/22/2021 CLINICAL DATA:  Neuro deficit, acute, stroke suspected. EXAM: MRI HEAD WITHOUT CONTRAST TECHNIQUE: Multiplanar, multiecho pulse sequences of the brain and surrounding structures were obtained without intravenous contrast. COMPARISON:  Head CT same day FINDINGS: Brain: Diffusion imaging shows an approximally 3 cm region of acute infarction affecting the cortical and subcortical  brain of the left parietal lobe. No swelling or hemorrhage visible at this time. There is a focus of what probably represents T2 shine through within the right posterior parietal subcortical white matter. The brainstem appears normal. No focal cerebellar finding. Cerebral hemispheres show moderate chronic small-vessel ischemic changes throughout the deep and subcortical white matter. No mass, hydrocephalus or extra-axial collection. Vascular: Major vessels at the base of the brain show flow. Skull and upper cervical spine: Negative Sinuses/Orbits: Clear/normal Other: None IMPRESSION: Acute infarction affecting a 3 cm region of the cortical and subcortical brain in the left parietal area. No mass effect or hemorrhage at this time. Probable focus of T2 shine through within the right posterior parietal white matter. Moderate chronic small-vessel ischemic changes of the cerebral hemispheric white matter diffusely. Electronically Signed   By: Nelson Chimes M.D.   On: 03/22/2021 16:18   DG Abdomen Acute W/Chest  Result Date: 03/15/2021 CLINICAL DATA:  Vomiting EXAM: DG ABDOMEN ACUTE WITH 1 VIEW CHEST COMPARISON:  CT 11/21/2020, radiograph 11/21/2020, chest  x-ray 11/13/2020 FINDINGS: Single-view chest demonstrates no focal opacity or pleural effusion. Normal cardiomediastinal silhouette with aortic atherosclerosis. Questionable nodule in the right mid lung. Supine and upright views of the abdomen demonstrate multiple dilated loops of central small bowel up to 5.2 cm with fluid levels on upright exam and relative paucity of distal gas concerning for a bowel obstruction. Surgical clips in the right upper quadrant. Lucency beneath left diaphragm felt to represent air-fluid level in the stomach. IMPRESSION: 1. No radiographic evidence for acute cardiopulmonary abnormality. Possible right midlung nodule which may be correlated with chest CT 2. Findings concerning for small bowel obstruction Electronically Signed   By: Donavan Foil M.D.   On: 03/15/2021 20:33   DG Abd Portable 1 View  Result Date: 03/15/2021 CLINICAL DATA:  Nasogastric tube placement EXAM: PORTABLE ABDOMEN - 1 VIEW COMPARISON:  03/15/2021 FINDINGS: Nasogastric tube is been placed with its proximal side hole within the distal esophagus and its tip just within the proximal stomach. Dilated loops of small bowel are seen within the visualized upper abdomen in keeping with underlying small-bowel obstruction. The visualized lung bases are clear. IMPRESSION: Nasogastric tube tip just within the proximal body of the stomach with the proximal side hole position within the distal esophagus. Advancement of the catheter by 15 cm would more optimally position the catheter Electronically Signed   By: Fidela Salisbury M.D.   On: 03/15/2021 22:45   CT HEAD CODE STROKE WO CONTRAST  Result Date: 03/22/2021 CLINICAL DATA:  Code stroke. Neuro deficit, acute, stroke suspected. Aphasia. EXAM: CT HEAD WITHOUT CONTRAST TECHNIQUE: Contiguous axial images were obtained from the base of the skull through the vertex without intravenous contrast. COMPARISON:  None. FINDINGS: Brain: Age related atrophy. Chronic small-vessel  ischemic changes of the white matter, most prominent in the left frontal white matter. No sign of acute infarction, mass lesion, hemorrhage, hydrocephalus or extra-axial collection. Vascular: No abnormal vascular finding. Skull: Normal Sinuses/Orbits: Clear/normal Other: None ASPECTS ([place] Stroke Program Early CT Score) - Ganglionic level infarction (caudate, lentiform nuclei, internal capsule, insula, M1-M3 cortex): 7 - Supraganglionic infarction (M4-M6 cortex): 3 Total score (0-10 with 10 being normal): 10 IMPRESSION: 1. No acute finding. Chronic small-vessel ischemic changes of the white matter, more notable in the left frontal region. 2. ASPECTS is 10 3. These results were communicated to Dr. Rory Percy at 2:48 pm on 03/22/2021 by text page via the [facility] messaging system. Electronically Signed   By:  Nelson Chimes M.D.   On: 03/22/2021 14:49    Assessment: 86 y.o. female with PMH of HTN, Afib on eliquis, GIB due to colon cancer stage 1 s/p hemicolectomy with post op ileus admitted to [facility] for abdominal pain and N/V. Her eliquis was on hold. Found to have small bowel obstruction, s/p exploratory laparotomy and intraoperative lysis of adhesions on 03/17/21. She again had post op lieus and surgery is following. Developed acute aphasia. Code stroke activated and CT no acute finding. Not able to have CTA done due to allergy to contrast. MRI done showed left MCA parietal small cortical and subcortical infarct. Right posterior parietal WM infarct. MRA showed left MCA M1 high grade stenosis with M2 superior division occlusion. Right M2 possible occlusion. Family consented for thrombectomy. Pt was sent to [facility] for cerebral angio and intention to treat with thrombectomy.   Stroke Risk Factors - atrial fibrillation  Plan: Urgent thrombectomy with Dr. Estanislado Pandy ICU admission post IR Neuro check and vital sign Q1h MRI brain repeat in am MRA head repeat in am Echocardiogram  Extubate as able after IR CCM consult will be  obtained if remain intubated post IR CBC, BMP, fasting lipid panel and HgbA1C PT/OT/speech consult BP goal 120-140 post IR if successful procedure GI and DVT prophylaxis  Will decide on antithrombotics after IR Discussed with Dr. Estanislado Pandy and Dr. Rory Percy.   This patient is critically ill due to acute stroke with LVO, afib not on AC, small bowel obstruction and at significant risk of neurological worsening, death form recurrent stroke, hemorrhagic conversion, complication from IR, acute abdomen, heart failure. This patient's care requires constant monitoring of vital signs, hemodynamics, respiratory and cardiac monitoring, review of multiple databases, neurological assessment, discussion with family, other specialists and medical decision making of high complexity. I spent 50 minutes of neurocritical care time in the care of this patient. I discussed with Dr. Estanislado Pandy and Dr. Rory Percy.  Rosalin Hawking, MD PhD Stroke Neurology 03/22/2021 5:34 PM

## 2021-03-22 NOTE — Anesthesia Procedure Notes (Signed)
Procedure Name: Intubation Date/Time: 03/22/2021 4:50 PM Performed by: Jenne Campus, CRNA Pre-anesthesia Checklist: Patient identified, Emergency Drugs available, Suction available and Patient being monitored Patient Re-evaluated:Patient Re-evaluated prior to induction Oxygen Delivery Method: Circle System Utilized Preoxygenation: Pre-oxygenation with 100% oxygen Induction Type: IV induction, Rapid sequence and Cricoid Pressure applied Laryngoscope Size: Miller and 2 Grade View: Grade II Tube type: Oral Tube size: 7.0 mm Number of attempts: 1 Airway Equipment and Method: Stylet and Oral airway Placement Confirmation: ETT inserted through vocal cords under direct vision, positive ETCO2 and breath sounds checked- equal and bilateral Secured at: 21 cm Tube secured with: Tape Dental Injury: Teeth and Oropharynx as per pre-operative assessment

## 2021-03-22 NOTE — Sedation Documentation (Signed)
8 Fr angioseal deployed right groin ? ?

## 2021-03-23 ENCOUNTER — Encounter (HOSPITAL_COMMUNITY): Payer: Self-pay | Admitting: Radiology

## 2021-03-23 ENCOUNTER — Inpatient Hospital Stay (HOSPITAL_COMMUNITY): Payer: Medicare PPO

## 2021-03-23 DIAGNOSIS — I6389 Other cerebral infarction: Secondary | ICD-10-CM | POA: Diagnosis not present

## 2021-03-23 LAB — ECHOCARDIOGRAM COMPLETE
AR max vel: 1.8 cm2
AV Area VTI: 1.55 cm2
AV Area mean vel: 1.74 cm2
AV Mean grad: 4 mmHg
AV Peak grad: 7.7 mmHg
Ao pk vel: 1.39 m/s
Height: 64 in
S' Lateral: 2.2 cm

## 2021-03-23 LAB — CBC WITH DIFFERENTIAL/PLATELET
Abs Immature Granulocytes: 0 10*3/uL (ref 0.00–0.07)
Basophils Absolute: 0 10*3/uL (ref 0.0–0.1)
Basophils Relative: 0 %
Eosinophils Absolute: 0 10*3/uL (ref 0.0–0.5)
Eosinophils Relative: 0 %
HCT: 38.9 % (ref 36.0–46.0)
Hemoglobin: 12.7 g/dL (ref 12.0–15.0)
Lymphocytes Relative: 1 %
Lymphs Abs: 0.1 10*3/uL — ABNORMAL LOW (ref 0.7–4.0)
MCH: 28.3 pg (ref 26.0–34.0)
MCHC: 32.6 g/dL (ref 30.0–36.0)
MCV: 86.6 fL (ref 80.0–100.0)
Monocytes Absolute: 0.1 10*3/uL (ref 0.1–1.0)
Monocytes Relative: 1 %
Neutro Abs: 7.5 10*3/uL (ref 1.7–7.7)
Neutrophils Relative %: 98 %
Platelets: 276 10*3/uL (ref 150–400)
RBC: 4.49 MIL/uL (ref 3.87–5.11)
RDW: 13.5 % (ref 11.5–15.5)
WBC: 7.7 10*3/uL (ref 4.0–10.5)
nRBC: 0 % (ref 0.0–0.2)
nRBC: 0 /100 WBC

## 2021-03-23 LAB — BASIC METABOLIC PANEL
Anion gap: 11 (ref 5–15)
BUN: 8 mg/dL (ref 8–23)
CO2: 22 mmol/L (ref 22–32)
Calcium: 9.1 mg/dL (ref 8.9–10.3)
Chloride: 104 mmol/L (ref 98–111)
Creatinine, Ser: 0.63 mg/dL (ref 0.44–1.00)
GFR, Estimated: >60 mL/min (ref >60)
Glucose, Bld: 138 mg/dL — ABNORMAL HIGH (ref 70–99)
Potassium: 3.1 mmol/L — ABNORMAL LOW (ref 3.5–5.1)
Sodium: 137 mmol/L (ref 135–145)

## 2021-03-23 LAB — HEMOGLOBIN A1C
Hgb A1c MFr Bld: 5.3 % (ref 4.8–5.6)
Mean Plasma Glucose: 105.41 mg/dL

## 2021-03-23 LAB — LIPID PANEL
Cholesterol: 141 mg/dL (ref 0–200)
HDL: 41 mg/dL (ref >40)
LDL Cholesterol: 80 mg/dL (ref 0–99)
Total CHOL/HDL Ratio: 3.4 RATIO
Triglycerides: 98 mg/dL (ref <150)
VLDL: 20 mg/dL (ref 0–40)

## 2021-03-23 LAB — PHOSPHORUS: Phosphorus: 2.8 mg/dL (ref 2.5–4.6)

## 2021-03-23 LAB — MAGNESIUM: Magnesium: 1.6 mg/dL — ABNORMAL LOW (ref 1.7–2.4)

## 2021-03-23 MED ORDER — ASPIRIN EC 325 MG PO TBEC
325.0000 mg | DELAYED_RELEASE_TABLET | Freq: Every day | ORAL | Status: DC
  Administered 2021-03-24 (×2): 325 mg via ORAL
  Filled 2021-03-23 (×2): qty 1

## 2021-03-23 MED ORDER — POTASSIUM PHOSPHATES 15 MMOLE/5ML IV SOLN
20.0000 mmol | Freq: Once | INTRAVENOUS | Status: AC
  Administered 2021-03-23: 20 mmol via INTRAVENOUS
  Filled 2021-03-23: qty 6.67

## 2021-03-23 MED ORDER — POTASSIUM CHLORIDE CRYS ER 20 MEQ PO TBCR
20.0000 meq | EXTENDED_RELEASE_TABLET | Freq: Once | ORAL | Status: AC
  Administered 2021-03-23: 20 meq via ORAL
  Filled 2021-03-23: qty 1

## 2021-03-23 MED ORDER — ENSURE ENLIVE PO LIQD
237.0000 mL | Freq: Two times a day (BID) | ORAL | Status: DC
  Administered 2021-03-23 – 2021-03-28 (×10): 237 mL via ORAL

## 2021-03-23 MED ORDER — MAGNESIUM SULFATE 2 GM/50ML IV SOLN
2.0000 g | Freq: Once | INTRAVENOUS | Status: AC
  Administered 2021-03-23: 2 g via INTRAVENOUS

## 2021-03-23 MED ORDER — METOPROLOL SUCCINATE ER 25 MG PO TB24
50.0000 mg | ORAL_TABLET | Freq: Every day | ORAL | Status: DC
  Administered 2021-03-23 – 2021-03-28 (×6): 50 mg via ORAL
  Filled 2021-03-23 (×2): qty 2
  Filled 2021-03-23: qty 1
  Filled 2021-03-23 (×2): qty 2
  Filled 2021-03-23: qty 1
  Filled 2021-03-23: qty 2

## 2021-03-23 MED ORDER — HEPARIN SODIUM (PORCINE) 5000 UNIT/ML IJ SOLN
5000.0000 [IU] | Freq: Three times a day (TID) | INTRAMUSCULAR | Status: DC
  Administered 2021-03-24: 5000 [IU] via SUBCUTANEOUS
  Filled 2021-03-23: qty 1

## 2021-03-23 NOTE — Progress Notes (Addendum)
General Surgery Follow Up Note  Subjective:    Overnight Issues:   Objective:  Vital signs for last 24 hours: Temp:  [97.6 F (36.4 C)-98 F (36.7 C)] 98 F (36.7 C) (10/06 0400) Pulse Rate:  [94-119] 97 (10/06 0630) Resp:  [8-24] 20 (10/06 0630) BP: (109-197)/(56-110) 126/70 (10/06 0600) SpO2:  [92 %-100 %] 94 % (10/06 0630) Arterial Line BP: (91-196)/(49-95) 136/52 (10/06 0630)  Hemodynamic parameters for last 24 hours:    Intake/Output from previous day: 10/05 0701 - 10/06 0700 In: 1764.5 [I.V.:1764.5] Out: 600 [Urine:600]  Intake/Output this shift: No intake/output data recorded.  Vent settings for last 24 hours:    Physical Exam:  Gen: comfortable, no distress Neuro: non-focal exam, not oriented, but conversant HEENT: PERRL Neck: supple CV: RRR Pulm: unlabored breathing Abd: soft, NT GU: clear yellow urine Extr: wwp, no edema   Results for orders placed or performed during the hospital encounter of 03/15/21 (from the past 24 hour(s))  Glucose, capillary     Status: Abnormal   Collection Time: 03/22/21  2:17 PM  Result Value Ref Range   Glucose-Capillary 107 (H) 70 - 99 mg/dL   Comment 1 Notify RN   Magnesium     Status: Abnormal   Collection Time: 03/23/21  3:17 AM  Result Value Ref Range   Magnesium 1.6 (L) 1.7 - 2.4 mg/dL  Hemoglobin A1c     Status: None   Collection Time: 03/23/21  3:17 AM  Result Value Ref Range   Hgb A1c MFr Bld 5.3 4.8 - 5.6 %   Mean Plasma Glucose 105.41 mg/dL  Lipid panel     Status: None   Collection Time: 03/23/21  3:17 AM  Result Value Ref Range   Cholesterol 141 0 - 200 mg/dL   Triglycerides 98 <150 mg/dL   HDL 41 >40 mg/dL   Total CHOL/HDL Ratio 3.4 RATIO   VLDL 20 0 - 40 mg/dL   LDL Cholesterol 80 0 - 99 mg/dL  Basic metabolic panel     Status: Abnormal   Collection Time: 03/23/21  3:17 AM  Result Value Ref Range   Sodium 137 135 - 145 mmol/L   Potassium 3.1 (L) 3.5 - 5.1 mmol/L   Chloride 104 98 - 111  mmol/L   CO2 22 22 - 32 mmol/L   Glucose, Bld 138 (H) 70 - 99 mg/dL   BUN 8 8 - 23 mg/dL   Creatinine, Ser 0.63 0.44 - 1.00 mg/dL   Calcium 9.1 8.9 - 10.3 mg/dL   GFR, Estimated >60 >60 mL/min   Anion gap 11 5 - 15  CBC with Differential/Platelet     Status: Abnormal   Collection Time: 03/23/21  3:17 AM  Result Value Ref Range   WBC 7.7 4.0 - 10.5 K/uL   RBC 4.49 3.87 - 5.11 MIL/uL   Hemoglobin 12.7 12.0 - 15.0 g/dL   HCT 38.9 36.0 - 46.0 %   MCV 86.6 80.0 - 100.0 fL   MCH 28.3 26.0 - 34.0 pg   MCHC 32.6 30.0 - 36.0 g/dL   RDW 13.5 11.5 - 15.5 %   Platelets 276 150 - 400 K/uL   nRBC 0.0 0.0 - 0.2 %   Neutrophils Relative % 98 %   Neutro Abs 7.5 1.7 - 7.7 K/uL   Lymphocytes Relative 1 %   Lymphs Abs 0.1 (L) 0.7 - 4.0 K/uL   Monocytes Relative 1 %   Monocytes Absolute 0.1 0.1 - 1.0 K/uL  Eosinophils Relative 0 %   Eosinophils Absolute 0.0 0.0 - 0.5 K/uL   Basophils Relative 0 %   Basophils Absolute 0.0 0.0 - 0.1 K/uL   nRBC 0 0 /100 WBC   Abs Immature Granulocytes 0.00 0.00 - 0.07 K/uL    Assessment & Plan: The plan of care was discussed with the bedside nurse for the day, Marissa, who is in agreement with this plan and no additional concerns were raised.   Present on Admission:  Paroxysmal atrial fibrillation (HCC)  Hypertension  SBO (small bowel obstruction) (HCC)  Cecal cancer & polyps s/p lap hemicolectomy 10/31/2020  Stroke (cerebrum) ([place])  Middle cerebral artery embolism, left    LOS: 8 days   Additional comments:I reviewed the patient's new clinical lab test results.   and I reviewed the patients new imaging test results.    SBO - s/p exlap, LOA 9/30 by Dr. Harlow Asa. Ileus resolving. +BM o/n. Reg diet started by primary team this AM. Still with some distention and tympany on exam, I would have a low threshold for diet de-escalation. Recommend adding nutritional supplements (ordered). Recommend correction of electrolytes, goals K 4, phos 3, mag 2. Stroke - on  cleviprex, management per primary DVT - SCDs, recommend chemical DVT ppx, preferably LMWH 40 daily, defer decision-making to primary team Dispo - ICU per primary team    Jesusita Oka, MD Trauma & General Surgery Please use [email or web address] to contact on call provider  03/23/2021  *Care during the described time interval was provided by me. I have reviewed this patient's available data, including medical history, events of note, physical examination and test results as part of my evaluation.

## 2021-03-23 NOTE — Evaluation (Signed)
Speech Language Pathology Evaluation Patient Details Name: Robin [name] MRN: [id number] DOB: 1935/04/12 Today's Date: 03/23/2021 Time: 3614-4315 SLP Time Calculation (min) (ACUTE ONLY): 23 min  Problem List:  Patient Active Problem List   Diagnosis Date Noted   Cerebral embolism with cerebral infarction 03/22/2021   Stroke (cerebrum) ([place]) 03/22/2021   Middle cerebral artery embolism, left 03/22/2021   Obstructed internal hernia 03/15/2021   SBO (small bowel obstruction) ([place]) 03/15/2021   Persistent atrial fibrillation ([place]) 12/30/2020   Lower extremity edema 12/30/2020   Irritable bowel syndrome with diarrhea 11/23/2020   Decreased estrogen level 11/23/2020   Hypertension 11/23/2020   Kidney stone 11/23/2020   Solitary pulmonary nodule 11/23/2020   Vitamin D deficiency 11/23/2020   Hyperlipidemia 11/23/2020   Hypercalcemia 11/23/2020   Pressure injury of skin 11/15/2020   Protein-calorie malnutrition, severe ([place]) 11/11/2020   Hypophosphatemia 11/03/2020   Cecal cancer & polyps s/p lap hemicolectomy 10/31/2020 10/31/2020   High grade dysplasia in colonic adenomas transverse colon & splenic flexure 10/31/2020   Colon cancer ([place]) 10/31/2020   Colonic mass    GI bleed 10/27/2020   Anemia 10/26/2020   Chronic anticoagulation 02/12/2019   Venous insufficiency (chronic) (peripheral) 02/12/2019   Normal coronary arteries 02/12/2019   Carotid stenosis 03/02/2017   Paroxysmal atrial fibrillation ([place]) 08/05/2015   First degree heart block by electrocardiogram 06/03/2014   Hypokalemia 03/02/2014   PAC (premature atrial contraction) 05/05/2012   Hypercholesterolemia 06/13/2011   Benign hypertensive heart disease without heart failure 12/11/2010   Past Medical History:  Past Medical History:  Diagnosis Date   Anxiety    AV bloc first degree    Carotid stenosis 03/02/2017   Hyperlipidemia    Hypertension    Lower extremity edema 12/30/2020   Lumbar pain    fell off of  her horse and has low back pain at times   Persistent atrial fibrillation ([place]) 12/30/2020   Venous insufficiency of both lower extremities    Vertigo    Past Surgical History:  Past Surgical History:  Procedure Laterality Date   ABDOMINAL HYSTERECTOMY     BIOPSY  10/28/2020   Procedure: BIOPSY;  Surgeon: Milus Banister, MD;  Location: [facility];  Service: Endoscopy;;   BREAST BIOPSY Left    BREAST EXCISIONAL BIOPSY Right    BREAST LUMPECTOMY WITH RADIOACTIVE SEED LOCALIZATION Left 05/05/2015   Procedure: LEFT BREAST LUMPECTOMY WITH RADIOACTIVE SEED LOCALIZATION;  Surgeon: Autumn Messing III, MD;  Location: [place];  Service: General;  Laterality: Left;   CHOLECYSTECTOMY     COLON RESECTION N/A 10/31/2020   Procedure: LAPAROSCOPIC RIGHT COLON RESECTION, lysis of adhesions, hemicolectomy, bilateral tap block;  Surgeon: [name], MD;  Location: [facility];  Service: General;  Laterality: N/A;   COLONOSCOPY WITH PROPOFOL N/A 10/28/2020   Procedure: COLONOSCOPY WITH PROPOFOL;  Surgeon: Milus Banister, MD;  [facility];  Service: Endoscopy;  Laterality: N/A;   EYE SURGERY     bil cataract surgery   LAPAROTOMY N/A 03/17/2021   Procedure: EXPLORATORY LAPAROTOMY/LYSIS OF ADHESIONS;  Surgeon: [name], MD;  Location: [facility];  Service: General;  Laterality: N/A;   LUNG SURGERY     POLYPECTOMY  10/28/2020   Procedure: POLYPECTOMY;  Surgeon: Milus Banister, MD;  [facility];  Service: Endoscopy;;   RADIOLOGY WITH ANESTHESIA N/A 03/22/2021   Procedure: IR WITH ANESTHESIA;  Surgeon: Radiologist, Medication, MD;  Location: [place];  Service: Radiology;  Laterality: N/A;   SUBMUCOSAL TATTOO  INJECTION  10/28/2020   Procedure: SUBMUCOSAL TATTOO INJECTION;  Surgeon: Milus Banister, MD;  [facility];  Service: Endoscopy;;   HPI:  86 y.o. F with medical history significant of A.Fib on eliquis, HTN.  presented to ED with c/o abd pain, N/V.  Symptoms  onset 9/27. Imaging revealed small bowel obstruction. Since admission to [facility], pt experienced stroke; imaging revealed acute infarction affecting a 3 cm region of the cortical and subcortical brain in the left parietal area with no mass effect or hemorrhage.   Assessment / Plan / Recommendation Clinical Impression  Pt was seen for a speech language evaluation. Overall, pt presents with mild anomic aphasia especially apparent with numbers and c/b semantic and phonemic paraphasias of which pt is aware. Cognition is largely intact and oral motor function WFL. Verbal output is fluent and receptive language largely intact, although comprehension of complex language structures is mildly impaired. Reading aloud is also impacted by both semantic and phonemic paraphasias. Pt benefits from phonemic cues and models to improve verbal output. Vocal quality WFL and pt 100% intelligible across all utterance lengths. Pt and daughter educated re: word finding strategies and likelihood of continued improvment. SLP will continue to follow for linguistic therapy targeting word finding, compensatory strategies, and continued pt/care partner education.    SLP Assessment  SLP Recommendation/Assessment: Patient needs continued Speech [place] [facility] SLP Visit Diagnosis: Aphasia (R47.01)    Recommendations for follow up therapy are one component of a multi-disciplinary discharge planning process, led by the attending physician.  Recommendations may be updated based on patient status, additional functional criteria and insurance authorization.    Follow Up Recommendations  Outpatient SLP    Frequency and Duration min 2x/week  2 weeks      SLP Evaluation Cognition  Overall Cognitive Status: Within Functional Limits for tasks assessed Arousal/Alertness: Awake/alert Orientation Level: Oriented X4 Month: October Day of Week: Correct Attention: Sustained Sustained Attention: Appears intact Memory: Appears  intact Awareness: Appears intact Problem Solving: Appears intact Executive Function: Self Monitoring;Self Correcting Self Monitoring: Appears intact Self Correcting: Appears intact Safety/Judgment: Appears intact       Comprehension  Auditory Comprehension Overall Auditory Comprehension: Appears within functional limits for tasks assessed Yes/No Questions: Within Functional Limits Conversation: Simple Other Conversation Comments: Complex language structures more difficult to comprehend that simple language structures. Visual Recognition/Discrimination Discrimination: Within Function Limits Reading Comprehension Reading Status: Impaired Paragraph Level: Impaired Functional Environmental (signs, name badge): Within functional limits Effective Techniques: Other (comment) (increased time)    Expression Expression Primary Mode of Expression: Verbal Verbal Expression Overall Verbal Expression: Impaired Initiation: No impairment Automatic Speech:  (intact) Level of Generative/Spontaneous Verbalization: Conversation Repetition: No impairment Naming: Impairment Confrontation: Impaired (75-100% acc) Verbal Errors: Semantic paraphasias;Phonemic paraphasias;Aware of errors Pragmatics: No impairment Effective Techniques: Phonemic cues;Semantic cues Non-Verbal Means of Communication: Not applicable Written Expression Dominant Hand: Right Written Expression: Not tested   Oral / Motor  Oral Motor/Sensory Function Overall Oral Motor/Sensory Function: Within functional limits Motor Speech Overall Motor Speech: Appears within functional limits for tasks assessed Respiration: Within functional limits Phonation: Normal;Other (comment) (presbyphonia) Resonance: Within functional limits Articulation: Within functional limitis Intelligibility: Intelligible Motor Planning: Witnin functional limits Motor Speech Errors: Not applicable   GO           Dewitt Rota,  SLP-Student         Nayla Dias 03/23/2021, 1:20 PM

## 2021-03-23 NOTE — Progress Notes (Signed)
Pt ambulating to [facility]. After sitting for about 5 minutes pt groin site began bleeding. Pt immediately placed back to bed and pressure held. Small hematoma present. Dr. Estanislado Pandy paged. Quick clot placed at 1032 by IR staff. Pressure held for 15 minutes.

## 2021-03-23 NOTE — Progress Notes (Signed)
OT Cancellation Note  Patient Details Name: Robin [name] MRN: [id number] DOB: 1934-11-26   Cancelled Treatment:    Reason Eval/Treat Not Completed: Medical issues which prohibited therapy (R groin bleeding; nsg asking to hold and check back this pm)  [place], OT/L   Acute OT Clinical Specialist [place] Pager [phone] Office [phone]  03/23/2021, 10:41 AM

## 2021-03-23 NOTE — TOC CAGE-AID Note (Signed)
Transition of Care [facility]) - CAGE-AID Screening   Patient Details  Name: Robin [name] MRN: [id number] Date of Birth: Aug 27, 1934  Transition of Care [facility] [address] CM/SW Contact:    Liahna Brickner C Tarpley-Carter, [place] Phone Number: 03/23/2021, 3:06 PM   Clinical Narrative: Pt participated in [place].  Pt stated she does not use substance or ETOH.  Pt was not offered resources, due to no usage of substance or ETOH.     Lauree Yurick Tarpley-Carter, MSW, LCSW-A Pronouns:  She/Her/Hers [facility]itions of [facility] Clinical Social Worker Direct Number:  [phone] [email or web address]  CAGE-AID Screening:    Have You Ever Felt You Ought to Cut Down on Your Drinking or Drug Use?: No Have People Annoyed You By [employer] Your Drinking Or Drug Use?: No Have You Felt Bad Or Guilty About Your Drinking Or Drug Use?: No Have You Ever Had a Drink or Used Drugs First Thing In The Morning to Steady Your Nerves or to Get Rid of a Hangover?: No CAGE-AID Score: 0  Substance Abuse Education Offered: No

## 2021-03-23 NOTE — Progress Notes (Signed)
Chief Complaint: Patient was seen today for [facility] CVA post cerebral angiogram   Supervising Physician: [name]  Patient Status: [facility] - In-pt  Subjective: Sitting up in bed, speech much improved this am. Neurology team at bedside as well.   Objective: Physical Exam: BP 118/72 (BP Location: Left Arm)   Pulse 89   Temp 98.7 F (37.1 C) (Axillary)   Resp 18   Ht 5\' 4"  (1.626 m)   SpO2 93%   BMI 22.45 kg/m  Awake and alert. Speech improved compared to pre-op (R)groin soft, NT, no hematoma   Current Facility-Administered Medications:     stroke: mapping our early stages of recovery book, , Does not apply, Once, Rosalin Hawking, MD   0.9 %  sodium chloride infusion, , Intravenous, Continuous, Rosalin Hawking, MD, Last Rate: 75 mL/hr at 03/23/21 0600, Infusion Verify at 03/23/21 0600   acetaminophen (TYLENOL) tablet 650 mg, 650 mg, Oral, Q4H PRN **OR** acetaminophen (TYLENOL) 160 MG/5ML solution 650 mg, 650 mg, Per Tube, Q4H PRN **OR** acetaminophen (TYLENOL) suppository 650 mg, 650 mg, Rectal, Q4H PRN, Deveshwar, Sanjeev, MD   alum & mag hydroxide-simeth (MAALOX/MYLANTA) 200-200-20 MG/5ML suspension 30 mL, 30 mL, Oral, Q6H PRN, [name], MD   Chlorhexidine Gluconate Cloth 2 % PADS 6 each, 6 each, Topical, Daily, [name], MD, 6 each at 03/22/21 1841   clevidipine (CLEVIPREX) infusion 0.5 mg/mL, 0-21 mg/hr, Intravenous, Continuous, Deveshwar, Sanjeev, MD, Last Rate: 4 mL/hr at 03/23/21 0600, 2 mg/hr at 03/23/21 0600   feeding supplement (ENSURE ENLIVE / ENSURE PLUS) liquid 237 mL, 237 mL, Oral, BID BM, Lovick, Montel Culver, MD   hydrALAZINE (APRESOLINE) injection 10 mg, 10 mg, Intravenous, Q4H PRN, [name], MD, 10 mg at 03/20/21 0148   HYDROmorphone (DILAUDID) injection 0.5-1 mg, 0.5-1 mg, Intravenous, Q2H PRN, [name], MD, 1 mg at 03/20/21 2334   labetalol (NORMODYNE) injection 10 mg, 10 mg, Intravenous, Q4H PRN, [name], MD, 10 mg at 03/21/21  1441   lip balm (CARMEX) ointment 1 application, 1 application, Topical, BID, [name], MD, 1 application at 40/98/11 2300   magic mouthwash, 10 mL, Oral, TID, [name], MD, 10 mL at 03/22/21 0851   magnesium sulfate IVPB 2 g 50 mL, 2 g, Intravenous, Once, Arrien, Jimmy Picket, MD   metoprolol succinate (TOPROL-XL) 24 hr tablet 50 mg, 50 mg, Oral, Daily, Williams, Nita Sells, NP   ondansetron (ZOFRAN) injection 4 mg, 4 mg, Intravenous, Q6H PRN, Arrien, Jimmy Picket, MD   pantoprazole (PROTONIX) EC tablet 40 mg, 40 mg, Oral, Daily, Arrien, Jimmy Picket, MD, 40 mg at 03/22/21 1349   senna-docusate (Senokot-S) tablet 1 tablet, 1 tablet, Oral, QHS PRN, Amie Portland, MD   simethicone (MYLICON) 40 BJ/4.7WG suspension 80 mg, 80 mg, Oral, QID PRN, [name], MD  Labs: CBC Recent Labs    03/22/21 0505 03/23/21 0317  WBC 9.1 7.7  HGB 13.4 12.7  HCT 40.3 38.9  PLT 225 276   BMET Recent Labs    03/22/21 0505 03/23/21 0317  NA 133* 137  K 3.1* 3.1*  CL 98 104  CO2 25 22  GLUCOSE 99 138*  BUN 14 8  CREATININE 0.53 0.63  CALCIUM 9.1 9.1   LFT No results for input(s): PROT, ALBUMIN, AST, ALT, ALKPHOS, BILITOT, BILIDIR, IBILI, LIPASE in the last 72 hours. PT/INR No results for input(s): LABPROT, INR in the last 72 hours.   Studies/Results: MR ANGIO HEAD WO CONTRAST  Result Date: 03/22/2021 CLINICAL DATA:  Neuro deficit, acute, stroke suspected. EXAM: MRI HEAD WITHOUT CONTRAST TECHNIQUE: Multiplanar, multiecho pulse sequences of the brain and surrounding structures were obtained without intravenous contrast. COMPARISON:  Head CT same day FINDINGS: Brain: Diffusion imaging shows an approximally 3 cm region of acute infarction affecting the cortical and subcortical brain of the left parietal lobe. No swelling or hemorrhage visible at this time. There is a focus of what probably represents T2 shine through within the right posterior parietal subcortical white matter. The  brainstem appears normal. No focal cerebellar finding. Cerebral hemispheres show moderate chronic small-vessel ischemic changes throughout the deep and subcortical white matter. No mass, hydrocephalus or extra-axial collection. Vascular: Major vessels at the base of the brain show flow. Skull and upper cervical spine: Negative Sinuses/Orbits: Clear/normal Other: None IMPRESSION: Acute infarction affecting a 3 cm region of the cortical and subcortical brain in the left parietal area. No mass effect or hemorrhage at this time. Probable focus of T2 shine through within the right posterior parietal white matter. Moderate chronic small-vessel ischemic changes of the cerebral hemispheric white matter diffusely. Electronically Signed   By: Nelson Chimes M.D.   On: 03/22/2021 16:18   MR BRAIN WO CONTRAST  Result Date: 03/22/2021 CLINICAL DATA:  Neuro deficit, acute, stroke suspected. EXAM: MRI HEAD WITHOUT CONTRAST TECHNIQUE: Multiplanar, multiecho pulse sequences of the brain and surrounding structures were obtained without intravenous contrast. COMPARISON:  Head CT same day FINDINGS: Brain: Diffusion imaging shows an approximally 3 cm region of acute infarction affecting the cortical and subcortical brain of the left parietal lobe. No swelling or hemorrhage visible at this time. There is a focus of what probably represents T2 shine through within the right posterior parietal subcortical white matter. The brainstem appears normal. No focal cerebellar finding. Cerebral hemispheres show moderate chronic small-vessel ischemic changes throughout the deep and subcortical white matter. No mass, hydrocephalus or extra-axial collection. Vascular: Major vessels at the base of the brain show flow. Skull and upper cervical spine: Negative Sinuses/Orbits: Clear/normal Other: None IMPRESSION: Acute infarction affecting a 3 cm region of the cortical and subcortical brain in the left parietal area. No mass effect or hemorrhage at this  time. Probable focus of T2 shine through within the right posterior parietal white matter. Moderate chronic small-vessel ischemic changes of the cerebral hemispheric white matter diffusely. Electronically Signed   By: Nelson Chimes M.D.   On: 03/22/2021 16:18   CT HEAD CODE STROKE WO CONTRAST  Result Date: 03/22/2021 CLINICAL DATA:  Code stroke. Neuro deficit, acute, stroke suspected. Aphasia. EXAM: CT HEAD WITHOUT CONTRAST TECHNIQUE: Contiguous axial images were obtained from the base of the skull through the vertex without intravenous contrast. COMPARISON:  None. FINDINGS: Brain: Age related atrophy. Chronic small-vessel ischemic changes of the white matter, most prominent in the left frontal white matter. No sign of acute infarction, mass lesion, hemorrhage, hydrocephalus or extra-axial collection. Vascular: No abnormal vascular finding. Skull: Normal Sinuses/Orbits: Clear/normal Other: None ASPECTS ([place] Stroke Program Early CT Score) - Ganglionic level infarction (caudate, lentiform nuclei, internal capsule, insula, M1-M3 cortex): 7 - Supraganglionic infarction (M4-M6 cortex): 3 Total score (0-10 with 10 being normal): 10 IMPRESSION: 1. No acute finding. Chronic small-vessel ischemic changes of the white matter, more notable in the left frontal region. 2. ASPECTS is 10 3. These results were communicated to Dr. Rory Percy at 2:48 pm on 03/22/2021 by text page via the [facility] messaging system. Electronically Signed   By: Nelson Chimes M.D.   On: 03/22/2021 14:49  Assessment/Plan: S/P 4 vessel cerebral artreriogram Findings. 1.Occluded angular branch of LT MCA inf division  in the M3/M4 region with robust collaterals. 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 3.Mod FMD changes in the mid third of RT ICA 4.Focal  2.9 mm out pouching of the Lt ICA in the PCOM region ,probable aneurysm  Neuro team to d/w surgery about resuming anticoagulation for  a.fib Can further discuss FMD findings once a little more recovered from all acute issues.   LOS: 8 days   I spent a total of 15 minutes in face to face in clinical consultation, greater than 50% of which was counseling/coordinating care for [facility] CVA  Ascencion Dike PA-C 03/23/2021 9:48 AM

## 2021-03-23 NOTE — Progress Notes (Signed)
  Echocardiogram 2D Echocardiogram has been performed.  Robin [name] F 03/23/2021, 12:08 PM

## 2021-03-23 NOTE — Progress Notes (Signed)
Therapy Note  Patient Details Name: Robin [name] MRN: [id number] DOB: [birth year]   Clarification of events and information in DR. Xu's note.   PT had worked with pt previous day 03/21/2021 and pt did well.   On 03/22/2021 Mobility Specialist had walked with pt in hallway supervision with RW 150 feet at 11:00 am.   PT and OT did not work with pt on 03/22/2021 at 12:45pm, just for clarity. Thanks  Clide Dales 03/23/2021, 2:48 PM Gatha Mayer, PT, MPT Acute Rehabilitation Services Office: [phone] Pager: [phone] 03/23/2021

## 2021-03-23 NOTE — Progress Notes (Addendum)
STROKE TEAM PROGRESS NOTE   ATTENDING NOTE: I reviewed above note and agree with the assessment and plan. Pt was seen and examined.   RN at bedside.  Patient sitting in bed, awake alert, fully orientated, speech much improved, follows all simple commands, with intermittent word finding difficulty, naming 2/4, able to repeat simple sentence but not complicated sentences.  Moving all extremities equal strengths, sensation symmetrical, finger-to-nose intact.    Still has persistent A. fib and complaining of mild abdominal pain, surgery Dr. Bobbye Morton on board, will start clear liquid and ensure.  Patient worked with PT/OT, recommended SNF.  When working with PT, patient had mild groin incision wound bleeding, had pressure compression and bedrest for 4 hours, bleeding resolved, no groin hematoma.  We will repeat CT head, if no large infarct or bleeding in the brain, will consider to start Eliquis tonight.  For detailed assessment and plan, please refer to above as I have made changes wherever appropriate.   Rosalin Hawking, MD PhD Stroke Neurology 03/23/2021 5:32 PM  This patient is critically ill due to left MCA stroke, status post cerebral angiogram, small bowel obstruction, persistent A. fib and at significant risk of neurological worsening, death form recurrent stroke, hemorrhagic conversion, acute abdomen, heart failure. This patient's care requires constant monitoring of vital signs, hemodynamics, respiratory and cardiac monitoring, review of multiple databases, neurological assessment, discussion with family, other specialists and medical decision making of high complexity. I spent 40 minutes of neurocritical care time in the care of this patient.    INTERVAL HISTORY Patient in bed NAD. Cleviprex at 2. Patient is speaking much better, but still with some word finding difficulty. Clear liquid diet. Transfer out of ICU later today if weaned off cleviprex.  Vitals:   03/23/21 0600 03/23/21 0615  03/23/21 0630 03/23/21 0800  BP: 126/70   118/72  Pulse: 94 99 97 89  Resp: 18 15 20 18   Temp:    98.7 F (37.1 C)  TempSrc:    Axillary  SpO2: 94% 95% 94% 93%  Height:       CBC:  Recent Labs  Lab 03/22/21 0505 03/23/21 0317  WBC 9.1 7.7  NEUTROABS 6.5 7.5  HGB 13.4 12.7  HCT 40.3 38.9  MCV 86.9 86.6  PLT 225 765   Basic Metabolic Panel:  Recent Labs  Lab 03/22/21 0505 03/23/21 0317  NA 133* 137  K 3.1* 3.1*  CL 98 104  CO2 25 22  GLUCOSE 99 138*  BUN 14 8  CREATININE 0.53 0.63  CALCIUM 9.1 9.1  MG 1.7 1.6*  PHOS  --  2.8   Lipid Panel:  Recent Labs  Lab 03/23/21 0317  CHOL 141  TRIG 98  HDL 41  CHOLHDL 3.4  VLDL 20  LDLCALC 80   HgbA1c:  Recent Labs  Lab 03/23/21 0317  HGBA1C 5.3    IMAGING past 24 hours MR ANGIO HEAD WO CONTRAST  Result Date: 03/22/2021 CLINICAL DATA:  Neuro deficit, acute, stroke suspected. EXAM: MRI HEAD WITHOUT CONTRAST TECHNIQUE: Multiplanar, multiecho pulse sequences of the brain and surrounding structures were obtained without intravenous contrast. COMPARISON:  Head CT same day FINDINGS: Brain: Diffusion imaging shows an approximally 3 cm region of acute infarction affecting the cortical and subcortical brain of the left parietal lobe. No swelling or hemorrhage visible at this time. There is a focus of what probably represents T2 shine through within the right posterior parietal subcortical white matter. The brainstem appears normal. No focal cerebellar finding.  Cerebral hemispheres show moderate chronic small-vessel ischemic changes throughout the deep and subcortical white matter. No mass, hydrocephalus or extra-axial collection. Vascular: Major vessels at the base of the brain show flow. Skull and upper cervical spine: Negative Sinuses/Orbits: Clear/normal Other: None IMPRESSION: Acute infarction affecting a 3 cm region of the cortical and subcortical brain in the left parietal area. No mass effect or hemorrhage at this time.  Probable focus of T2 shine through within the right posterior parietal white matter. Moderate chronic small-vessel ischemic changes of the cerebral hemispheric white matter diffusely. Electronically Signed   By: Nelson Chimes M.D.   On: 03/22/2021 16:18   MR BRAIN WO CONTRAST  Result Date: 03/22/2021 CLINICAL DATA:  Neuro deficit, acute, stroke suspected. EXAM: MRI HEAD WITHOUT CONTRAST TECHNIQUE: Multiplanar, multiecho pulse sequences of the brain and surrounding structures were obtained without intravenous contrast. COMPARISON:  Head CT same day FINDINGS: Brain: Diffusion imaging shows an approximally 3 cm region of acute infarction affecting the cortical and subcortical brain of the left parietal lobe. No swelling or hemorrhage visible at this time. There is a focus of what probably represents T2 shine through within the right posterior parietal subcortical white matter. The brainstem appears normal. No focal cerebellar finding. Cerebral hemispheres show moderate chronic small-vessel ischemic changes throughout the deep and subcortical white matter. No mass, hydrocephalus or extra-axial collection. Vascular: Major vessels at the base of the brain show flow. Skull and upper cervical spine: Negative Sinuses/Orbits: Clear/normal Other: None IMPRESSION: Acute infarction affecting a 3 cm region of the cortical and subcortical brain in the left parietal area. No mass effect or hemorrhage at this time. Probable focus of T2 shine through within the right posterior parietal white matter. Moderate chronic small-vessel ischemic changes of the cerebral hemispheric white matter diffusely. Electronically Signed   By: Nelson Chimes M.D.   On: 03/22/2021 16:18   CT HEAD CODE STROKE WO CONTRAST  Result Date: 03/22/2021 CLINICAL DATA:  Code stroke. Neuro deficit, acute, stroke suspected. Aphasia. EXAM: CT HEAD WITHOUT CONTRAST TECHNIQUE: Contiguous axial images were obtained from the base of the skull through the vertex  without intravenous contrast. COMPARISON:  None. FINDINGS: Brain: Age related atrophy. Chronic small-vessel ischemic changes of the white matter, most prominent in the left frontal white matter. No sign of acute infarction, mass lesion, hemorrhage, hydrocephalus or extra-axial collection. Vascular: No abnormal vascular finding. Skull: Normal Sinuses/Orbits: Clear/normal Other: None ASPECTS (Worth Stroke Program Early CT Score) - Ganglionic level infarction (caudate, lentiform nuclei, internal capsule, insula, M1-M3 cortex): 7 - Supraganglionic infarction (M4-M6 cortex): 3 Total score (0-10 with 10 being normal): 10 IMPRESSION: 1. No acute finding. Chronic small-vessel ischemic changes of the white matter, more notable in the left frontal region. 2. ASPECTS is 10 3. These results were communicated to Dr. Rory Percy at 2:48 pm on 03/22/2021 by text page via the [facility] messaging system. Electronically Signed   By: Nelson Chimes M.D.   On: 03/22/2021 14:49    PHYSICAL EXAM Physical Exam  Constitutional: Appears well-developed and well-nourished.  Psych: Affect appropriate to situation Eyes: Normal external eye and conjunctiva. HENT: Normocephalic, no lesions, without obvious abnormality.   Musculoskeletal-no joint tenderness, deformity or swelling Cardiovascular: Normal rate and regular rhythm.  Respiratory: Effort normal, non-labored breathing saturations WNL GI: Soft.  No distension. There is no tenderness.  Skin: WDI   Neuro:  Mental Status: Alert, oriented, thought content appropriate.  Speech fluent without evidence of aphasia. Still some word finding difficulty that becomes more  apparent the longer she talks. Able to follow simple commands.  Cranial Nerves: II:  Visual fields grossly normal,  III,IV, VI: ptosis not present, extra-ocular motions intact bilaterally pupils equal, round, reactive to light and accommodation V,VII: smile symmetric, facial light touch sensation normal bilaterally VIII:  hearing normal bilaterally IX,X: uvula rises symmetrically XI: bilateral shoulder shrug XII: midline tongue extension Motor: Right : Upper extremity   4/5  Left:     Upper extremity   4/5  Lower extremity   5/5   Lower extremity   5/5 Tone and bulk:normal tone throughout; no atrophy noted rUE motion limited by shoulder pain.  Sensory:  light touch intact throughout, bilaterally Cerebellar: No ataxia noted Gait: deferred     ASSESSMENT/PLAN [name] is a 86 y.o. female with history of a. Fib,  GI bleed ( may 2022), sbo presenting with aphasia and weakness s/p exploratory lap.   Stroke:  left parietal area infarct embolic secondary to a. Fib and no anticoagulation d/t surgery.  Code Stroke  CT head No acute finding ASPECTS 10.    MRI  Acute infarction affecting a 3 cm region of the cortical and subcortical brain in the left parietal area.  IR: Occluded angular branch of LT MCA inf division  in the M3/M4 region with robust collaterals. Moderate to severe  FMD changes of  the  mid third Lt ICA  associated with 2 pseudoaneurysms. Repeat head ct: pending 2D Echo pending LDL 80 HgbA1c 5.3 VTE prophylaxis - SCd's Eliquis (apixaban) daily prior to admission, now on no anticoagulation. Pending repeat CTH, may restart eliquis today. Discussed with Dr. Bobbye Morton, she is OK with eliquis Therapy recommendations:  SNF Disposition:  pending  Hypertension Home meds:  metoprolol XR Stable Wean off cleviprex today 10/6 Permissive hypertension (OK if < 220/120) but gradually normalize in 5-7 days Long-term BP goal normotensive  Hyperlipidemia Home meds:  none,  LDL 80, goal < 70  Diabetes type II Controlled (no diagnosis) Home meds:  none HgbA1c 5.3, goal < 7.0 CBGs Recent Labs    03/22/21 1417  GLUCAP 107*     Other Stroke Risk Factors Advanced Age >/= 64  Family Hx stroke  Other Active Problems Right shoulder pain from previous fall>>>> shoulder xray ordered  10/6 Small bowel obstruction - surgery on board - on clear liquid  Hospital day # [place], MSN, NP-C [facility] [phone]   To contact Stroke Continuity provider, please refer to [email or web address] After hours, contact General Neurology

## 2021-03-23 NOTE — Evaluation (Signed)
Physical Therapy Re-Evaluation Patient Details Name: Robin [name] MRN: [id number] DOB: [birth year] Today's Date: 03/23/2021  History of Present Illness  patient is a 86 yo female admitted with SBO s/p ex lap with lysis of adhesions 9/30. Hx of Afib. fell on 10/5 and code stroke called, found to have left MCA parietal small cortical and subcortical infarct. Right posterior parietal WM infarct. MRA showed left MCA M1 high grade stenosis with M2 superior division occlusion. Right M2 possible occlusion. Pt was sent to [facility] for cerebral angiogram performed 10/5.  Clinical Impression  Patient presents with new deficits since issues noted above including decreased balance, decreased activity tolerance, decreased safety and R UE pain with decreased ROM and mild R LE weakness.  She lived alone previously and daughter can help intermittently.  She will benefit from continued skilled PT in the acute setting and from SNF level rehab at d/c.        Recommendations for follow up therapy are one component of a multi-disciplinary discharge planning process, led by the attending physician.  Recommendations may be updated based on patient status, additional functional criteria and insurance authorization.  Follow Up Recommendations SNF    Equipment Recommendations  None recommended by PT    Recommendations for Other Services       Precautions / Restrictions Precautions Precautions: Fall      Mobility  Bed Mobility Overal bed mobility: Needs Assistance Bed Mobility: Supine to Sit     Supine to sit: Min guard;HOB elevated     General bed mobility comments: assist for lines and balance, c/o moderate back pain upon sitting up, BP 119/78 sitting    Transfers Overall transfer level: Needs assistance Equipment used: 1 person hand held assist Transfers: Sit to/from [facility] Sit to Stand: Min guard Stand pivot transfers: Min assist       General transfer comment: assist to  recliner by the bedside, limited mobility today due to having to return to bedrest after up on BSC this am and R groin bleeding from angiogram site.  Ambulation/Gait                Stairs            Wheelchair Mobility    Modified Rankin (Stroke Patients Only) Modified Rankin (Stroke Patients Only) Pre-Morbid Rankin Score: No significant disability Modified Rankin: Moderately severe disability     Balance Overall balance assessment: Needs assistance   Sitting balance-Leahy Scale: Good     Standing balance support: Single extremity supported Standing balance-Leahy Scale: Poor                               Pertinent Vitals/Pain Pain Score: 3  Faces Pain Scale: Hurts whole lot Pain Location: R shoulder Pain Descriptors / Indicators: Guarding;Grimacing;Aching;Discomfort Pain Intervention(s): Monitored during session;Repositioned;Limited activity within patient's tolerance    Home Living Family/patient expects to be discharged to:: Private residence Living Arrangements: Alone Available Help at Discharge: Family;Available PRN/intermittently Type of Home: House Home Access: Stairs to enter Entrance Stairs-Rails: Right Entrance Stairs-Number of Steps: 3-4 Home Layout: One level Home Equipment: Grab bars - tub/shower;Cane - single point;Walker - 2 wheels;Shower seat - built in Additional Comments: daughter can assist prn    Prior Function Level of Independence: Independent               Hand Dominance   Dominant Hand: Right    Extremity/Trunk Assessment   Upper  Extremity Assessment Upper Extremity Assessment: RUE deficits/detail RUE Deficits / Details: pain with shoulder elevation AROM and PROM with AROM to about 25 and PROM to about 80, less pain with abduction    Lower Extremity Assessment Lower Extremity Assessment: RLE deficits/detail RLE Deficits / Details: AROM WFL, strength hip flexion about 3+/5 but just had bleeding from  angiogram site this am so limited resistance given, otherwise strength WFL       Communication   Communication: Expressive difficulties  Cognition Arousal/Alertness: Awake/alert Behavior During Therapy: WFL for tasks assessed/performed Overall Cognitive Status: Within Functional Limits for tasks assessed                                        General Comments General comments (skin integrity, edema, etc.): daughter in the room and relates her recent history since surgery in May and prolonged hospitalization needing STSNF 2.5 weeks then had HHPT 7 weeks.  States she could stay couple of days initially, but patient concerned since her daughter has missed so much work.    Exercises     Assessment/Plan    PT Assessment Patient needs continued PT services  PT Problem List Decreased strength;Decreased mobility;Decreased balance;Decreased knowledge of use of DME;Pain;Decreased activity tolerance;Decreased safety awareness;Decreased knowledge of precautions       PT Treatment Interventions DME instruction;Therapeutic activities;Patient/family education;Therapeutic exercise;Gait training;Balance training;Functional mobility training    PT Goals (Current goals can be found in the Care Plan section)  Acute Rehab PT Goals Patient Stated Goal: to return to independent PT Goal Formulation: With patient/family Time For Goal Achievement: 04/06/21 Potential to Achieve Goals: Good    Frequency Min 4X/week   Barriers to discharge Decreased caregiver support      Co-evaluation               AM-PAC PT "6 Clicks" Mobility  Outcome Measure Help needed turning from your back to your side while in a flat bed without using bedrails?: A Little Help needed moving from lying on your back to sitting on the side of a flat bed without using bedrails?: A Little Help needed moving to and from a bed to a chair (including a wheelchair)?: A Little Help needed standing up from a chair  using your arms (e.g., wheelchair or bedside chair)?: A Little Help needed to walk in hospital room?: Total Help needed climbing 3-5 steps with a railing? : Total 6 Click Score: 14    End of Session   Activity Tolerance: Patient limited by fatigue Patient left: in chair;with call bell/phone within reach;with chair alarm set;with family/visitor present;with nursing/sitter in room   PT Visit Diagnosis: Other abnormalities of gait and mobility (R26.89);Muscle weakness (generalized) (M62.81);History of falling (Z91.81);Pain Pain - Right/Left: Right Pain - part of body: Shoulder    Time: 5038-8828 PT Time Calculation (min) (ACUTE ONLY): 24 min   Charges:   PT Evaluation $PT Re-evaluation: 1 Re-eval PT Treatments $Therapeutic Activity: 8-22 mins        Magda Kiel, PT Acute Rehabilitation Services [id number]:[phone] Office:[phone] 03/23/2021   Reginia Naas 03/23/2021, 4:58 PM

## 2021-03-24 ENCOUNTER — Inpatient Hospital Stay (HOSPITAL_COMMUNITY): Payer: Medicare PPO

## 2021-03-24 LAB — BASIC METABOLIC PANEL
Anion gap: 7 (ref 5–15)
BUN: 12 mg/dL (ref 8–23)
CO2: 25 mmol/L (ref 22–32)
Calcium: 8.6 mg/dL — ABNORMAL LOW (ref 8.9–10.3)
Chloride: 102 mmol/L (ref 98–111)
Creatinine, Ser: 0.57 mg/dL (ref 0.44–1.00)
GFR, Estimated: >60 mL/min (ref >60)
Glucose, Bld: 103 mg/dL — ABNORMAL HIGH (ref 70–99)
Potassium: 3 mmol/L — ABNORMAL LOW (ref 3.5–5.1)
Sodium: 134 mmol/L — ABNORMAL LOW (ref 135–145)

## 2021-03-24 LAB — CBC
HCT: 35.1 % — ABNORMAL LOW (ref 36.0–46.0)
Hemoglobin: 11.7 g/dL — ABNORMAL LOW (ref 12.0–15.0)
MCH: 28.7 pg (ref 26.0–34.0)
MCHC: 33.3 g/dL (ref 30.0–36.0)
MCV: 86.2 fL (ref 80.0–100.0)
Platelets: 248 10*3/uL (ref 150–400)
RBC: 4.07 MIL/uL (ref 3.87–5.11)
RDW: 13.8 % (ref 11.5–15.5)
WBC: 9.3 10*3/uL (ref 4.0–10.5)
nRBC: 0 % (ref 0.0–0.2)

## 2021-03-24 MED ORDER — SODIUM CHLORIDE 0.9% FLUSH: 3.0000 mL | INTRAVENOUS | Status: DC | PRN

## 2021-03-24 MED ORDER — POTASSIUM CHLORIDE CRYS ER 20 MEQ PO TBCR
40.0000 meq | EXTENDED_RELEASE_TABLET | Freq: Three times a day (TID) | ORAL | Status: AC
  Administered 2021-03-24 (×3): 40 meq via ORAL
  Filled 2021-03-24 (×3): qty 2

## 2021-03-24 MED ORDER — APIXABAN 5 MG PO TABS
5.0000 mg | ORAL_TABLET | Freq: Two times a day (BID) | ORAL | Status: DC
Start: 2021-03-24 — End: 2021-03-26
  Administered 2021-03-24 – 2021-03-25 (×3): 5 mg via ORAL
  Filled 2021-03-24 (×3): qty 1

## 2021-03-24 MED ORDER — SODIUM CHLORIDE 0.9% FLUSH: 3.0000 mL | Freq: Two times a day (BID) | INTRAVENOUS | Status: DC

## 2021-03-24 MED ORDER — SODIUM CHLORIDE 0.9 % IV SOLN: 250.0000 mL | INTRAVENOUS | Status: DC | PRN

## 2021-03-24 NOTE — Progress Notes (Signed)
Progress Note  2 Days Post-Op  Subjective: Patient denies abdominal pain. She is tolerating liquids and denies n/v. Having bowel function. Working with therapies. Discussed with stroke MD this AM as well who is planning on transferring back to medical service and patient cleared to get out of ICU.   Objective: Vital signs in last 24 hours: Temp:  [97.8 F (36.6 C)-99.7 F (37.6 C)] 99.7 F (37.6 C) (10/07 0800) Pulse Rate:  [70-102] 92 (10/07 1015) Resp:  [13-24] 22 (10/07 1015) BP: (110-156)/(61-116) 147/96 (10/07 1015) SpO2:  [83 %-98 %] 95 % (10/07 1015) Last BM Date: 03/24/21  Intake/Output from previous day: 10/06 0701 - 10/07 0700 In: 1356.7 [I.V.:724.1; IV Piggyback:632.6] Out: 0  Intake/Output this shift: Total I/O In: 100 [I.V.:100] Out: 300 [Urine:300]  PE: General: pleasant, WD, elderly female who is laying in bed in NAD Heart: regular, rate, and rhythm.   Lungs: CTAB, no wheezes, rhonchi, or rales noted.  Respiratory effort nonlabored Abd: soft, NT, minimal distention, +BS, incision c/d/i MS: RUE thrombophlebitis Skin: warm and dry with no masses, lesions, or rashes Neuro: Cranial nerves 2-12 grossly intact, speech clear Psych: A&Ox3 with an appropriate affect.    Lab Results:  Recent Labs    03/23/21 0317 03/24/21 0256  WBC 7.7 9.3  HGB 12.7 11.7*  HCT 38.9 35.1*  PLT 276 248   BMET Recent Labs    03/23/21 0317 03/24/21 0256  NA 137 134*  K 3.1* 3.0*  CL 104 102  CO2 22 25  GLUCOSE 138* 103*  BUN 8 12  CREATININE 0.63 0.57  CALCIUM 9.1 8.6*   PT/INR No results for input(s): LABPROT, INR in the last 72 hours. CMP     Component Value Date/Time   NA 134 (L) 03/24/2021 0256   NA 139 02/21/2018 0854   K 3.0 (L) 03/24/2021 0256   CL 102 03/24/2021 0256   CO2 25 03/24/2021 0256   GLUCOSE 103 (H) 03/24/2021 0256   BUN 12 03/24/2021 0256   BUN 20 02/21/2018 0854   CREATININE 0.57 03/24/2021 0256   CREATININE 0.54 (L) 11/28/2015 1422    CALCIUM 8.6 (L) 03/24/2021 0256   PROT 7.7 03/15/2021 1925   ALBUMIN 4.7 03/15/2021 1925   AST 24 03/15/2021 1925   ALT 21 03/15/2021 1925   ALKPHOS 113 03/15/2021 1925   BILITOT 1.9 (H) 03/15/2021 1925   GFRNONAA >60 03/24/2021 0256   GFRAA 78 02/21/2018 0854   Lipase     Component Value Date/Time   LIPASE 31 10/26/2020 1919       Studies/Results: DG Shoulder Right  Result Date: 03/23/2021 CLINICAL DATA:  Fall. EXAM: RIGHT SHOULDER - 2+ VIEW COMPARISON:  None. FINDINGS: There is no evidence of fracture or dislocation. There is no evidence of arthropathy or other focal bone abnormality. Soft tissues are unremarkable. IMPRESSION: Negative. Electronically Signed   By: Marijo Conception M.D.   On: 03/23/2021 13:19   CT HEAD WO CONTRAST (5MM)  Result Date: 03/24/2021 CLINICAL DATA:  Follow-up examination for stroke. EXAM: CT HEAD WITHOUT CONTRAST TECHNIQUE: Contiguous axial images were obtained from the base of the skull through the vertex without intravenous contrast. COMPARISON:  Prior CT and MRI from 03/22/2021. FINDINGS: Brain: Age-related cerebral atrophy with moderate chronic microvascular ischemic disease. Previously identified acute ischemic infarct involving the posterior cortical and subcortical left parietal lobe again seen, relatively similar in size and distribution as compared to previous MRI. No associated hemorrhage or mass effect. No other  visible acute or evolving large vessel ischemic changes. No mass lesion or midline shift. No hydrocephalus or extra-axial fluid collection. Vascular: No hyperdense vessel. Skull: Scalp soft tissues demonstrate no acute finding. Calvarium intact. Sinuses/Orbits: Globes and orbital soft tissues demonstrate no acute finding. Paranasal sinuses mastoid air cells are clear. Other: None. IMPRESSION: 1. Continued interval evolution in early subacute left posterior MCA distribution infarct, relatively stable in size and appearance as compared to  previous MRI. No significant mass effect or evidence for hemorrhagic transformation. 2. No other new acute intracranial abnormality. 3. Age-related cerebral atrophy with moderate chronic microvascular ischemic disease. Electronically Signed   By: Jeannine Boga M.D.   On: 03/24/2021 00:58   MR ANGIO HEAD WO CONTRAST  Result Date: 03/22/2021 CLINICAL DATA:  Neuro deficit, acute, stroke suspected. EXAM: MRI HEAD WITHOUT CONTRAST TECHNIQUE: Multiplanar, multiecho pulse sequences of the brain and surrounding structures were obtained without intravenous contrast. COMPARISON:  Head CT same day FINDINGS: Brain: Diffusion imaging shows an approximally 3 cm region of acute infarction affecting the cortical and subcortical brain of the left parietal lobe. No swelling or hemorrhage visible at this time. There is a focus of what probably represents T2 shine through within the right posterior parietal subcortical white matter. The brainstem appears normal. No focal cerebellar finding. Cerebral hemispheres show moderate chronic small-vessel ischemic changes throughout the deep and subcortical white matter. No mass, hydrocephalus or extra-axial collection. Vascular: Major vessels at the base of the brain show flow. Skull and upper cervical spine: Negative Sinuses/Orbits: Clear/normal Other: None IMPRESSION: Acute infarction affecting a 3 cm region of the cortical and subcortical brain in the left parietal area. No mass effect or hemorrhage at this time. Probable focus of T2 shine through within the right posterior parietal white matter. Moderate chronic small-vessel ischemic changes of the cerebral hemispheric white matter diffusely. Electronically Signed   By: Nelson Chimes M.D.   On: 03/22/2021 16:18   MR BRAIN WO CONTRAST  Result Date: 03/22/2021 CLINICAL DATA:  Neuro deficit, acute, stroke suspected. EXAM: MRI HEAD WITHOUT CONTRAST TECHNIQUE: Multiplanar, multiecho pulse sequences of the brain and surrounding  structures were obtained without intravenous contrast. COMPARISON:  Head CT same day FINDINGS: Brain: Diffusion imaging shows an approximally 3 cm region of acute infarction affecting the cortical and subcortical brain of the left parietal lobe. No swelling or hemorrhage visible at this time. There is a focus of what probably represents T2 shine through within the right posterior parietal subcortical white matter. The brainstem appears normal. No focal cerebellar finding. Cerebral hemispheres show moderate chronic small-vessel ischemic changes throughout the deep and subcortical white matter. No mass, hydrocephalus or extra-axial collection. Vascular: Major vessels at the base of the brain show flow. Skull and upper cervical spine: Negative Sinuses/Orbits: Clear/normal Other: None IMPRESSION: Acute infarction affecting a 3 cm region of the cortical and subcortical brain in the left parietal area. No mass effect or hemorrhage at this time. Probable focus of T2 shine through within the right posterior parietal white matter. Moderate chronic small-vessel ischemic changes of the cerebral hemispheric white matter diffusely. Electronically Signed   By: Nelson Chimes M.D.   On: 03/22/2021 16:18   ECHOCARDIOGRAM COMPLETE  Result Date: 03/23/2021    ECHOCARDIOGRAM REPORT   Patient Name:   Robin [name] Date of Exam: 03/23/2021 Medical Rec #:  [id number]          Height:       64.0 in Accession #:    [id number]  Weight:       130.8 lb Date of Birth:  July 04, 1934         BSA:          1.633 m Patient Age:    61 years           BP:           148/65 mmHg Patient Gender: F                  HR:           52 bpm. Exam Location:  Inpatient Procedure: 2D Echo, Cardiac Doppler and Color Doppler Indications:    Stroke I63.9  History:        Patient has prior history of Echocardiogram examinations, most                 recent 11/15/2020. Arrythmias:Atrial Fibrillation; Risk                 Factors:Hypertension.  Sonographer:     Merrie Roof RDCS Referring Phys: [id number] ASHISH ARORA IMPRESSIONS  1. Left ventricular ejection fraction, by estimation, is 60 to 65%. The left ventricle has normal function. The left ventricle has no regional wall motion abnormalities. Left ventricular diastolic function could not be evaluated.  2. Right ventricular systolic function is normal. The right ventricular size is normal. There is mildly elevated pulmonary artery systolic pressure.  3. Right atrial size was mild to moderately dilated.  4. The mitral valve is grossly normal. Trivial mitral valve regurgitation.  5. The aortic valve is normal in structure. Aortic valve regurgitation is not visualized. FINDINGS  Left Ventricle: Left ventricular ejection fraction, by estimation, is 60 to 65%. The left ventricle has normal function. The left ventricle has no regional wall motion abnormalities. The left ventricular internal cavity size was normal in size. There is  no left ventricular hypertrophy. Left ventricular diastolic function could not be evaluated due to atrial fibrillation. Left ventricular diastolic function could not be evaluated. Right Ventricle: The right ventricular size is normal. Right vetricular wall thickness was not well visualized. Right ventricular systolic function is normal. There is mildly elevated pulmonary artery systolic pressure. The tricuspid regurgitant velocity  is 2.88 m/s, and with an assumed right atrial pressure of 3 mmHg, the estimated right ventricular systolic pressure is 69.6 mmHg. Left Atrium: Left atrial size was normal in size. Right Atrium: Right atrial size was mild to moderately dilated. Pericardium: There is no evidence of pericardial effusion. Mitral Valve: The mitral valve is grossly normal. Trivial mitral valve regurgitation. Tricuspid Valve: The tricuspid valve is grossly normal. Tricuspid valve regurgitation is mild. Aortic Valve: The aortic valve is normal in structure. Aortic valve regurgitation is not  visualized. Aortic valve mean gradient measures 4.0 mmHg. Aortic valve peak gradient measures 7.7 mmHg. Aortic valve area, by VTI measures 1.55 cm. Pulmonic Valve: The pulmonic valve was grossly normal. Pulmonic valve regurgitation is trivial. Aorta: The aortic root and ascending aorta are structurally normal, with no evidence of dilitation. IAS/Shunts: The atrial septum is grossly normal.  LEFT VENTRICLE PLAX 2D LVIDd:         3.40 cm LVIDs:         2.20 cm LV PW:         1.00 cm LV IVS:        1.00 cm LVOT diam:     1.80 cm LV SV:         44 LV SV Index:  27 LVOT Area:     2.54 cm  RIGHT VENTRICLE RV Basal diam:  2.90 cm LEFT ATRIUM           Index        RIGHT ATRIUM           Index LA diam:      3.60 cm 2.20 cm/m   RA Area:     24.70 cm LA Vol (A2C): 49.1 ml 30.06 ml/m  RA Volume:   78.50 ml  48.06 ml/m LA Vol (A4C): 40.6 ml 24.83 ml/m  AORTIC VALVE AV Area (Vmax):    1.80 cm AV Area (Vmean):   1.74 cm AV Area (VTI):     1.55 cm AV Vmax:           139.00 cm/s AV Vmean:          99.300 cm/s AV VTI:            0.286 m AV Peak Grad:      7.7 mmHg AV Mean Grad:      4.0 mmHg LVOT Vmax:         98.25 cm/s LVOT Vmean:        67.850 cm/s LVOT VTI:          0.174 m LVOT/AV VTI ratio: 0.61  AORTA Ao Root diam: 3.10 cm Ao Asc diam:  3.20 cm TRICUSPID VALVE TR Peak grad:   33.2 mmHg TR Vmax:        288.00 cm/s  SHUNTS Systemic VTI:  0.17 m Systemic Diam: 1.80 cm Mertie Moores MD Electronically signed by Mertie Moores MD Signature Date/Time: 03/23/2021/2:24:18 PM    Final    CT HEAD CODE STROKE WO CONTRAST  Result Date: 03/22/2021 CLINICAL DATA:  Code stroke. Neuro deficit, acute, stroke suspected. Aphasia. EXAM: CT HEAD WITHOUT CONTRAST TECHNIQUE: Contiguous axial images were obtained from the base of the skull through the vertex without intravenous contrast. COMPARISON:  None. FINDINGS: Brain: Age related atrophy. Chronic small-vessel ischemic changes of the white matter, most prominent in the left frontal  white matter. No sign of acute infarction, mass lesion, hemorrhage, hydrocephalus or extra-axial collection. Vascular: No abnormal vascular finding. Skull: Normal Sinuses/Orbits: Clear/normal Other: None ASPECTS ([place] Stroke Program Early CT Score) - Ganglionic level infarction (caudate, lentiform nuclei, internal capsule, insula, M1-M3 cortex): 7 - Supraganglionic infarction (M4-M6 cortex): 3 Total score (0-10 with 10 being normal): 10 IMPRESSION: 1. No acute finding. Chronic small-vessel ischemic changes of the white matter, more notable in the left frontal region. 2. ASPECTS is 10 3. These results were communicated to Dr. Rory Percy at 2:48 pm on 03/22/2021 by text page via the [facility] messaging system. Electronically Signed   By: Nelson Chimes M.D.   On: 03/22/2021 14:49   IR ANGIO INTRA EXTRACRAN SEL INTERNAL CAROTID BILAT MOD SED  Result Date: 03/24/2021 INDICATION: Acute onset of aphasia. EXAM: 1. EMERGENT LARGE VESSEL OCCLUSION THROMBOLYSIS (anterior CIRCULATION) COMPARISON:  CT angiogram of the head and neck of March 22, 2021. MEDICATIONS: Ancef 2 g IV antibiotic was administered within 1 hour of the procedure. ANESTHESIA/SEDATION: General anesthesia. CONTRAST:  Omnipaque 350 approximately 80 cc. FLUOROSCOPY TIME:  Fluoroscopy Time: 14 minutes 30 seconds (1215 mGy). COMPLICATIONS: None immediate. TECHNIQUE: Following a full explanation of the procedure along with the potential associated complications, an informed witnessed consent was obtained. The risks of intracranial hemorrhage of 10%, worsening neurological deficit, ventilator dependency, death and inability to revascularize were all reviewed in detail with the patient's  daughter. The patient was then put under general anesthesia by the Department of Anesthesiology at [facility]. The right groin was prepped and draped in the usual sterile fashion. Thereafter using modified Seldinger technique, transfemoral access into the right common femoral  artery was obtained without difficulty. Over a 0.035 inch guidewire an 8 [place] Pinnacle 25 cm sheath was inserted. Through this, and also over a 0.035 inch guidewire a 5 [place] JB 1 catheter was advanced to the aortic arch region and selectively positioned in the left common carotid artery, the right vertebral artery, the right common carotid artery, the left subclavian artery and the left vertebral artery. FINDINGS: The left common carotid arteriogram demonstrates the left external carotid artery to be mildly narrowed at its origin secondary to a smooth plaque. Its branches are normally opacified. The left internal carotid artery at the bulb demonstrates a smooth shallow plaque along the posterior wall without evidence of significant stenosis or of intraluminal filling defects or of ulcerations. Distal to this the proximal 1/3 left ICA appears widely patent. 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 Distal to this the left ICA appears widely patent. The petrous, the cavernous and the supraclinoid segments are widely patent. There is a focal outpouching of approximately 2.9 mm projecting inferiorly and posteriorly in the region of the left posterior communicating artery without evidence of vessel emanating from it. The left middle cerebral artery opacifies into the capillary and venous phases. Slow flow is noted in the distal M3 M4 region of the angular branch of the inferior division. Numerous collaterals are seen adjacent to this distal branch. The left anterior cerebral artery opacifies into the capillary and venous phases. The right vertebral artery origin is widely patent. The vessel is seen to opacify to the cranial skull base. Patency is seen of the right posterior-inferior cerebellar artery and the right vertebrobasilar junction. The opacified  portions of the basilar artery, the posterior cerebral arteries, the superior cerebellar arteries and the anterior-inferior cerebellar arteries demonstrate patency into the capillary and venous phases. Unopacified blood is seen in the basilar artery and to a lesser degree the posterior cerebral arteries from the contralateral vertebral artery. The right common carotid arteriogram demonstrates mild narrowing at the origin of the right external carotid artery. Its branches are widely patent. The right internal carotid artery at the bulb and its proximal 1/3 is widely patent. Smooth segmental focal area of small outpouching is seen in the middle 1/3 without significant intraluminal stenosis. Distal to this the vessel appears widely patent. The petrous, cavernous and supraclinoid segments are widely patent. The right middle cerebral artery and the right anterior cerebral artery opacify into the capillary and venous phases. Approximally 2 mm infundibulum is seen in the right PCOM region with the vessel emanating at its apex. The right middle cerebral artery and the right anterior cerebral artery opacify into the capillary and venous phases. The left vertebral artery origin demonstrates mild narrowing. The vessel is seen to opacify to the cranial skull base. Wide patency is seen of the left vertebrobasilar junction and the left posterior-inferior cerebellar artery. The basilar artery, the posterior cerebral arteries, the superior cerebellar arteries and the anterior-inferior cerebellar arteries demonstrate wide patency into the capillary and venous phases. The patient was extubated following placement of an 8 French Angio-Seal closure device for hemostasis at the right groin puncture site. Distal pulses were Dopplerable bilaterally.  Upon recovery, the patient moved all her four extremities spontaneously and to command. She continued to have expressive difficulties, however. She was then transferred to the neuro ICU for  further management. PROCEDURE: As above. 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 Moderate to severe segmental fibromuscular dysplastic changes involving the mid 1/3 the left internal carotid artery, and of a moderate degree involving the mid right internal carotid artery. Focal approximately 2.9 mm outpouching in the left posterior communicating artery region without a vessel emanating from the fundus probably representing a small aneurysm. PLAN: Follow-up as per referring MD. Electronically Signed   By: [name] M.D.   On: 03/24/2021 07:45   IR ANGIO VERTEBRAL SEL VERTEBRAL BILAT MOD SED  Result Date: 03/24/2021 INDICATION: Acute onset of aphasia. EXAM: 1. EMERGENT LARGE VESSEL OCCLUSION THROMBOLYSIS (anterior CIRCULATION) COMPARISON:  CT angiogram of the head and neck of March 22, 2021. MEDICATIONS: Ancef 2 g IV antibiotic was administered within 1 hour of the procedure. ANESTHESIA/SEDATION: General anesthesia. CONTRAST:  Omnipaque 350 approximately 80 cc. FLUOROSCOPY TIME:  Fluoroscopy Time: 14 minutes 30 seconds (1215 mGy). COMPLICATIONS: None immediate. TECHNIQUE: Following a full explanation of the procedure along with the potential associated complications, an informed witnessed consent was obtained. The risks of intracranial hemorrhage of 10%, worsening neurological deficit, ventilator dependency, death and inability to revascularize were all reviewed in detail with the patient's daughter. The patient was then put under general anesthesia by the Department of Anesthesiology at [facility]. The right groin was prepped and draped in the usual sterile fashion. Thereafter using modified Seldinger technique, transfemoral access into the right common femoral artery was obtained without difficulty. Over a 0.035 inch guidewire an 8 [place] Pinnacle 25 cm sheath was inserted. Through this,  and also over a 0.035 inch guidewire a 5 [place] JB 1 catheter was advanced to the aortic arch region and selectively positioned in the left common carotid artery, the right vertebral artery, the right common carotid artery, the left subclavian artery and the left vertebral artery. FINDINGS: The left common carotid arteriogram demonstrates the left external carotid artery to be mildly narrowed at its origin secondary to a smooth plaque. Its branches are normally opacified. The left internal carotid artery at the bulb demonstrates a smooth shallow plaque along the posterior wall without evidence of significant stenosis or of intraluminal filling defects or of ulcerations. Distal to this the proximal 1/3 left ICA appears widely patent. 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 Distal to this the left ICA appears widely patent. The petrous, the cavernous and the supraclinoid segments are widely patent. There is a focal outpouching of approximately 2.9 mm projecting inferiorly and posteriorly in the region of the left posterior communicating artery without evidence of vessel emanating from it. The left middle cerebral artery opacifies into the capillary and venous phases. Slow flow is noted in the distal M3 M4 region of the angular branch of the inferior division. Numerous collaterals are seen adjacent to this distal branch. The left anterior cerebral artery opacifies into the capillary and venous phases. The right vertebral artery origin is widely patent. The vessel is seen to opacify to the cranial skull base. Patency is seen of the right posterior-inferior cerebellar artery and the right vertebrobasilar junction. The opacified portions of the  basilar artery, the posterior cerebral arteries, the superior cerebellar arteries and the anterior-inferior cerebellar  arteries demonstrate patency into the capillary and venous phases. Unopacified blood is seen in the basilar artery and to a lesser degree the posterior cerebral arteries from the contralateral vertebral artery. The right common carotid arteriogram demonstrates mild narrowing at the origin of the right external carotid artery. Its branches are widely patent. The right internal carotid artery at the bulb and its proximal 1/3 is widely patent. Smooth segmental focal area of small outpouching is seen in the middle 1/3 without significant intraluminal stenosis. Distal to this the vessel appears widely patent. The petrous, cavernous and supraclinoid segments are widely patent. The right middle cerebral artery and the right anterior cerebral artery opacify into the capillary and venous phases. Approximally 2 mm infundibulum is seen in the right PCOM region with the vessel emanating at its apex. The right middle cerebral artery and the right anterior cerebral artery opacify into the capillary and venous phases. The left vertebral artery origin demonstrates mild narrowing. The vessel is seen to opacify to the cranial skull base. Wide patency is seen of the left vertebrobasilar junction and the left posterior-inferior cerebellar artery. The basilar artery, the posterior cerebral arteries, the superior cerebellar arteries and the anterior-inferior cerebellar arteries demonstrate wide patency into the capillary and venous phases. The patient was extubated following placement of an 8 French Angio-Seal closure device for hemostasis at the right groin puncture site. Distal pulses were Dopplerable bilaterally. Upon recovery, the patient moved all her four extremities spontaneously and to command. She continued to have expressive difficulties, however. She was then transferred to the neuro ICU for further management. PROCEDURE: As above. IMPRESSION: Slow flow with occlusion of the angular branch of the inferior division of the left  middle cerebral artery in the distal M3 M4 region. Moderate to severe segmental fibromuscular dysplastic changes involving the mid 1/3 the left internal carotid artery, and of a moderate degree involving the mid right internal carotid artery. Focal approximately 2.9 mm outpouching in the left posterior communicating artery region without a vessel emanating from the fundus probably representing a small aneurysm. PLAN: Follow-up as per referring MD. Electronically Signed   By: [name] M.D.   On: 03/24/2021 07:45    Anti-infectives: Anti-infectives (From admission, onward)    Start     Dose/Rate Route Frequency Ordered Stop   03/22/21 1650  ceFAZolin (ANCEF) 2-4 GM/100ML-% IVPB       Note to Pharmacy: Domenick Bookbinder   : cabinet override      03/22/21 1650 03/23/21 0459   03/17/21 1200  ertapenem (INVANZ) 1,000 mg in sodium chloride 0.9 % 100 mL IVPB        1 g 200 mL/hr over 30 Minutes Intravenous On call to O.R. 03/17/21 0936 03/17/21 1307        Assessment/Plan Cecal cancer s/p laparoscopic hemicolectomy 10/31/20 POD 7, s/p ex lap with LOA by Dr. Harlow Asa 9/30 for SBO - having bowel function and tolerating liquid diet, advancing to DYS 1 diet this AM - continue therapies, current recs for SNF, may ask what thoughts are on CIR - multi-modal pain control - IS/pulm toilet Hypokalemia, mild - K 3.0, replace to goal of 4.0  Stroke - pt doing well from this standpoint, stroke team has been managing, transferring out of ICU and likely back to [facility] service today  Fall - R shoulder films negative  RUE thrombophlebitis - warm compresses   FEN - DYS  1 diet, replace K PO VTE - SQ heparin , ok to resume DOAC today  ID - none needed currently  Paroxysmal A. Fib - ok to resume eliquis today  HTN  LOS: 9 days    Norm Parcel, [facility] 03/24/2021, 10:52 AM Please see Amion for pager number during day hours 7:00am-4:30pm

## 2021-03-24 NOTE — NC FL2 (Addendum)
[place] LEVEL OF CARE SCREENING TOOL     IDENTIFICATION  Patient Name: Robin [name] Birthdate: [birth year] Sex: female Admission Date (Current Location): 03/15/2021  [facility] and [place] Number:  Herbalist and Address:  [facility] [place] [address] [place], [place] [address]      Provider Number: [id number]  Attending Physician Name and Address:  Stroke, Md, MD  Relative Name and Phone Number:       Current Level of Care: Hospital Recommended Level of Care: [place] Prior Approval Number:    Date Approved/Denied:   PASRR Number: [id number]  Discharge Plan: SNF    Current Diagnoses: Patient Active Problem List   Diagnosis Date Noted   Cerebral embolism with cerebral infarction 03/22/2021   Stroke (cerebrum) ([place]) 03/22/2021   Middle cerebral artery embolism, left 03/22/2021   Obstructed internal hernia 03/15/2021   SBO (small bowel obstruction) ([place]) 03/15/2021   Persistent atrial fibrillation ([place]) 12/30/2020   Lower extremity edema 12/30/2020   Irritable bowel syndrome with diarrhea 11/23/2020   Decreased estrogen level 11/23/2020   Hypertension 11/23/2020   Kidney stone 11/23/2020   Solitary pulmonary nodule 11/23/2020   Vitamin D deficiency 11/23/2020   Hyperlipidemia 11/23/2020   Hypercalcemia 11/23/2020   Pressure injury of skin 11/15/2020   Protein-calorie malnutrition, severe ([place]) 11/11/2020   Hypophosphatemia 11/03/2020   Cecal cancer & polyps s/p lap hemicolectomy 10/31/2020 10/31/2020   High grade dysplasia in colonic adenomas transverse colon & splenic flexure 10/31/2020   Colon cancer ([place]) 10/31/2020   Colonic mass    GI bleed 10/27/2020   Anemia 10/26/2020   Chronic anticoagulation 02/12/2019   Venous insufficiency (chronic) (peripheral) 02/12/2019   Normal coronary arteries 02/12/2019   Carotid stenosis 03/02/2017   Paroxysmal atrial fibrillation ([place]) 08/05/2015    First degree heart block by electrocardiogram 06/03/2014   Hypokalemia 03/02/2014   PAC (premature atrial contraction) 05/05/2012   Hypercholesterolemia 06/13/2011   Benign hypertensive heart disease without heart failure 12/11/2010    Orientation RESPIRATION BLADDER Height & Weight     Self, Time, Situation, Place  Normal Continent Weight:   Height:  5\' 4"  (162.6 cm)  BEHAVIORAL SYMPTOMS/MOOD NEUROLOGICAL BOWEL NUTRITION STATUS      Continent Diet (see DC summary)  AMBULATORY STATUS COMMUNICATION OF NEEDS Skin   Limited Assist Verbally PU Stage and Appropriate Care, Surgical wounds (closed abdomen wound, no dressing; closed groin wound, gauze) PU Stage 1 Dressing:  (sacrum, foam dressing: lift every shift to assess and change every 3 days)                     Personal Care Assistance Level of Assistance  Bathing, Feeding, Dressing Bathing Assistance: Limited assistance Feeding assistance: Limited assistance Dressing Assistance: Limited assistance     Functional Limitations Info             SPECIAL CARE FACTORS FREQUENCY  PT (By licensed PT), OT (By licensed OT)     PT Frequency: 5x/wk OT Frequency: 5x/wk            Contractures Contractures Info: Not present    Additional Factors Info  Code Status, Allergies Code Status Info: Full Allergies Info: Iodinated Diagnostic Agents, Amlodipine, Codeine, Diphenhydramine Hcl, Pravastatin, Zetia (Ezetimibe), Sulfa Antibiotics, Sulfa Drugs Cross Reactors           Current Medications (03/24/2021):  This is the current hospital active medication list Current Facility-Administered Medications  Medication Dose  Route Frequency Provider Last Rate Last Admin    stroke: mapping our early stages of recovery book   Does not apply Once Rosalin Hawking, MD       0.9 %  sodium chloride infusion  250 mL Intravenous PRN Norm Parcel, PA-C       acetaminophen (TYLENOL) tablet 650 mg  650 mg Oral Q4H PRN [name], MD        Or   acetaminophen (TYLENOL) 160 MG/5ML solution 650 mg  650 mg Per Tube Q4H PRN Deveshwar, Willaim Rayas, MD       Or   acetaminophen (TYLENOL) suppository 650 mg  650 mg Rectal Q4H PRN Deveshwar, Willaim Rayas, MD       alum & mag hydroxide-simeth (MAALOX/MYLANTA) 200-200-20 MG/5ML suspension 30 mL  30 mL Oral Q6H PRN [name], MD       apixaban Arne Cleveland) tablet 5 mg  5 mg Oral BID Rosalin Hawking, MD       Chlorhexidine Gluconate Cloth 2 % PADS 6 each  6 each Topical Daily [name], MD   6 each at 03/24/21 1532   feeding supplement (ENSURE ENLIVE / ENSURE PLUS) liquid 237 mL  237 mL Oral BID BM Jesusita Oka, MD   237 mL at 03/24/21 1145   hydrALAZINE (APRESOLINE) injection 10 mg  10 mg Intravenous Q4H PRN [name], MD   10 mg at 03/20/21 0148   HYDROmorphone (DILAUDID) injection 0.5-1 mg  0.5-1 mg Intravenous Q2H PRN [name], MD   1 mg at 03/20/21 2334   labetalol (NORMODYNE) injection 10 mg  10 mg Intravenous Q4H PRN [name], MD   10 mg at 03/21/21 1441   lip balm (CARMEX) ointment 1 application  1 application Topical BID [name], MD   1 application at 28/31/51 1146   magnesium sulfate IVPB 2 g 50 mL  2 g Intravenous Once Arrien, Jimmy Picket, MD       metoprolol succinate (TOPROL-XL) 24 hr tablet 50 mg  50 mg Oral Daily Vonzella Nipple, NP   50 mg at 03/24/21 1137   ondansetron (ZOFRAN) injection 4 mg  4 mg Intravenous Q6H PRN Arrien, Jimmy Picket, MD       pantoprazole (PROTONIX) EC tablet 40 mg  40 mg Oral Daily Arrien, Jimmy Picket, MD   40 mg at 03/24/21 1136   potassium chloride SA (KLOR-CON) CR tablet 40 mEq  40 mEq Oral TID Norm Parcel, PA-C   40 mEq at 03/24/21 1525   senna-docusate (Senokot-S) tablet 1 tablet  1 tablet Oral QHS PRN Amie Portland, MD       simethicone (MYLICON) 40 VO/1.6WV suspension 80 mg  80 mg Oral QID PRN [name], MD       sodium chloride flush (NS) 0.9 % injection 3 mL  3 mL Intravenous Q12H Barkley Boards R,  PA-C       sodium chloride flush (NS) 0.9 % injection 3 mL  3 mL Intravenous PRN Norm Parcel, PA-C         Discharge Medications: Please see discharge summary for a list of discharge medications.  Relevant Imaging Results:  Relevant Lab Results:   Additional Information SS#: [id number]  Geralynn Ochs, LCSW  I have personally obtained history,examined this patient, reviewed notes, independently viewed imaging studies, participated in medical decision making and plan of care.ROS completed by me personally and pertinent positives fully documented  I have made any additions or clarifications directly to the  above note. Agree with note above.    Antony Contras, MD Medical Director [facility] Pager: [phone] 03/24/2021 4:31 PM

## 2021-03-24 NOTE — Progress Notes (Signed)
ANTICOAGULATION CONSULT NOTE - Initial Consult  Pharmacy Consult for apixaban Indication: atrial fibrillation  Allergies  Allergen Reactions   Iodinated Diagnostic Agents Shortness Of Breath   Amlodipine     Shortness of breath   Codeine Nausea Only    Tolerates Dilaudid fine   Diphenhydramine Hcl     Hypertension    Pravastatin     Muscle aches   Zetia [Ezetimibe]    Sulfa Antibiotics Rash   Sulfa Drugs Cross Reactors Rash    Patient Measurements: Height: 5\' 4"  (162.6 cm) IBW/kg (Calculated) : 54.7   Vital Signs: Temp: 98 F (36.7 C) (10/07 1200) Temp Source: Oral (10/07 1200) BP: 139/70 (10/07 1400) Pulse Rate: 87 (10/07 1400)  Labs: Recent Labs    03/22/21 0505 03/23/21 0317 03/24/21 0256  HGB 13.4 12.7 11.7*  HCT 40.3 38.9 35.1*  PLT 225 276 248  CREATININE 0.53 0.63 0.57    CrCl cannot be calculated (Unknown ideal weight.).   Medical History: Past Medical History:  Diagnosis Date   Anxiety    AV bloc first degree    Carotid stenosis 03/02/2017   Hyperlipidemia    Hypertension    Lower extremity edema 12/30/2020   Lumbar pain    fell off of her horse and has low back pain at times   Persistent atrial fibrillation ([place]) 12/30/2020   Venous insufficiency of both lower extremities    Vertigo     Medications:  Medications Prior to Admission  Medication Sig Dispense Refill Last Dose   acetaminophen (TYLENOL) 325 MG tablet Take 2 tablets (650 mg total) by mouth every 6 (six) hours as needed for mild pain, fever or headache.   Past Week   Cholecalciferol (VITAMIN D) 50 MCG (2000 UT) tablet Take 2,000 Units by mouth at bedtime.   03/14/2021 at pm   diphenoxylate-atropine (LOMOTIL) 2.5-0.025 MG tablet Take 1 tablet by mouth every 12 (twelve) hours as needed for diarrhea or loose stools (for diarrhea). 30 tablet 10 03/14/2021 at 1000   ELIQUIS 5 MG TABS tablet TAKE 1 TABLET BY MOUTH TWICE DAILY. 180 tablet 1 03/15/2021 at 0800   estradiol (ESTRACE) 1 MG  tablet Take 0.5 mg by mouth daily.   03/15/2021 at am   ferrous sulfate 325 (65 FE) MG tablet Take 1 tablet (325 mg total) by mouth 2 (two) times daily with a meal. (Patient taking differently: Take 325 mg by mouth at bedtime.) 60 tablet 0 03/14/2021 at pm   Lactobacillus Rhamnosus, GG, (CULTURELLE PO) Take 1 capsule by mouth in the morning.   03/15/2021 at am   Lutein 20 MG CAPS Take 1 capsule by mouth every evening.   03/14/2021 at pm   metoprolol succinate (TOPROL-XL) 50 MG 24 hr tablet Take 0.5-2 tablets (25-100 mg total) by mouth 2 (two) times daily. Take 100mg  in am and 25mg  in evening (Patient taking differently: Take 25-100 mg by mouth 2 (two) times daily. Take 100mg  by mouth in morning and 25mg  in evening as needed for heart rate above 90 BPM) 60 tablet 0 03/15/2021 at 0800   mometasone (ELOCON) 0.1 % lotion Apply 1 drop topically at bedtime. To forehead   03/14/2021 at pm   ondansetron (ZOFRAN ODT) 4 MG disintegrating tablet Take 1 tablet (4 mg total) by mouth every 8 (eight) hours as needed for nausea or vomiting. 20 tablet 0 03/15/2021 at am   polycarbophil (FIBERCON) 625 MG tablet Take 1 tablet (625 mg total) by mouth 2 (two) times daily.  60 tablet 10 03/15/2021   potassium chloride SA (KLOR-CON) 20 MEQ tablet Take 1 tablet (20 mEq total) by mouth 2 (two) times daily.   03/15/2021 at am   TIADYLT ER 180 MG 24 hr capsule TAKE (1) CAPSULE DAILY. (Patient taking differently: Take 180 mg by mouth daily.) 90 capsule 2 03/15/2021 at 1300   triamcinolone cream (KENALOG) 0.1 % Apply 1 application topically daily as needed (for rash).    03/14/2021 at pm   triamterene-hydrochlorothiazide (MAXZIDE-25) 37.5-25 MG tablet Take 1 tablet by mouth daily.   03/15/2021 at am   vitamin A 10000 UNIT capsule Take 10,000 Units by mouth daily.   03/15/2021 at am   Scheduled:    stroke: mapping our early stages of recovery book   Does not apply Once   aspirin EC  325 mg Oral Daily   Chlorhexidine Gluconate Cloth  6 each  Topical Daily   feeding supplement  237 mL Oral BID BM   heparin injection (subcutaneous)  5,000 Units Subcutaneous Q8H   lip balm  1 application Topical BID   metoprolol succinate  50 mg Oral Daily   pantoprazole  40 mg Oral Daily   potassium chloride  40 mEq Oral TID   sodium chloride flush  3 mL Intravenous Q12H    Assessment: 86 yo female with stroke, She was on apixaban PTA for afib and plans are to restart today. -SCr= 0.5 -hg= 11.7   Goal of Therapy:  Monitor platelets by anticoagulation protocol: Yes   Plan:  -discontinue heparin sq -Start apixaban 5mg  po bid  Hildred Laser, PharmD Clinical Pharmacist **Pharmacist phone directory can now be found on [email or web address] ([email or web address]).  Listed under [place].

## 2021-03-24 NOTE — Progress Notes (Signed)
Physical Therapy Treatment Patient Details Name: Robin [name] MRN: [id number] DOB: Nov 03, 1934 Today's Date: 03/24/2021   History of Present Illness patient is a 86 yo female admitted with SBO s/p ex lap with lysis of adhesions 9/30. Hx of Afib. fell on 10/5 and code stroke called, found to have left MCA parietal small cortical and subcortical infarct. Right posterior parietal WM infarct. MRA showed left MCA M1 high grade stenosis with M2 superior division occlusion. Right M2 possible occlusion. Pt was sent to [facility] for cerebral angiogram performed 10/5.    PT Comments    Patient progressing to hallway ambulation, but significantly fatigued after activities in bathroom then walking the hall.  R UE pain continues, but now with edema from IV.  She will continue to benefit from skilled PT and continue to feel appropriate for SNF level rehab at d/c.    Recommendations for follow up therapy are one component of a multi-disciplinary discharge planning process, led by the attending physician.  Recommendations may be updated based on patient status, additional functional criteria and insurance authorization.  Follow Up Recommendations  SNF     Equipment Recommendations  None recommended by PT    Recommendations for Other Services       Precautions / Restrictions Precautions Precautions: Fall     Mobility  Bed Mobility Overal bed mobility: Needs Assistance             General bed mobility comments: up in bathroom with NT    Transfers Overall transfer level: Needs assistance Equipment used: Rolling walker (2 wheeled) Transfers: Sit to/from Stand Sit to Stand: Min guard         General transfer comment: completed toilet hygiene in sitting wtih S, stood to RW with cues for hand placement  Ambulation/Gait Ambulation/Gait assistance: Min Web designer (Feet): 120 Feet Assistive device: Rolling walker (2 wheeled) Gait Pattern/deviations: Step-through  pattern;Decreased stride length     General Gait Details: assist for balance, pt feeling SOB with ambulation with standing rest needed, HR monitor reading erratically, RN reports not reading right most of morning, in to check leads, SpO2 99% after ambulation on RA   Stairs             Wheelchair Mobility    Modified Rankin (Stroke Patients Only) Modified Rankin (Stroke Patients Only) Pre-Morbid Rankin Score: No significant disability Modified Rankin: Moderately severe disability     Balance Overall balance assessment: Needs assistance   Sitting balance-Leahy Scale: Good     Standing balance support: Single extremity supported;During functional activity Standing balance-Leahy Scale: Poor Standing balance comment: washed hands at sink & brushed teeth minguard for balance                            Cognition Arousal/Alertness: Awake/alert Behavior During Therapy: WFL for tasks assessed/performed Overall Cognitive Status: Within Functional Limits for tasks assessed                                        Exercises      General Comments        Pertinent Vitals/Pain Pain Assessment: Faces Faces Pain Scale: Hurts little more Pain Location: R UE with IV infiltrate Pain Descriptors / Indicators: Grimacing;Guarding Pain Intervention(s): Monitored during session;Repositioned (elevated, RN aware and removed IV)    Home Living  Prior Function            PT Goals (current goals can now be found in the care plan section) Progress towards PT goals: Progressing toward goals    Frequency    Min 4X/week      PT Plan Current plan remains appropriate    Co-evaluation              AM-PAC PT "6 Clicks" Mobility   Outcome Measure  Help needed turning from your back to your side while in a flat bed without using bedrails?: A Little Help needed moving from lying on your back to sitting on the side of a  flat bed without using bedrails?: A Little Help needed moving to and from a bed to a chair (including a wheelchair)?: A Little Help needed standing up from a chair using your arms (e.g., wheelchair or bedside chair)?: A Little Help needed to walk in hospital room?: A Little Help needed climbing 3-5 steps with a railing? : Total 6 Click Score: 16    End of Session   Activity Tolerance: Patient limited by fatigue Patient left: in bed;with call bell/phone within reach   PT Visit Diagnosis: Other abnormalities of gait and mobility (R26.89);Muscle weakness (generalized) (M62.81);History of falling (Z91.81);Pain Pain - Right/Left: Right Pain - part of body: Arm     Time: 8546-2703 PT Time Calculation (min) (ACUTE ONLY): 33 min  Charges:  $Gait Training: 8-22 mins $Therapeutic Activity: 8-22 mins                     Robin [name], PT Acute Rehabilitation Services [phone] Office:[phone] 03/24/2021    Robin [name] 03/24/2021, 1:06 PM

## 2021-03-24 NOTE — Evaluation (Signed)
Occupational Therapy Evaluation Patient Details Name: Robin [name] MRN: [id number] DOB: 12/11/1934 Today's Date: 03/24/2021   History of Present Illness patient is a 86 yo female admitted with SBO s/p ex lap with lysis of adhesions 9/30. Hx of Afib. fell on 10/5 and code stroke called, found to have left MCA parietal small cortical and subcortical infarct. Right posterior parietal WM infarct. MRA showed left MCA M1 high grade stenosis with M2 superior division occlusion. Right M2 possible occlusion. Pt was sent to [facility] for cerebral angiogram performed 10/5.   Clinical Impression   PT admitted with L MCA. Pt currently with functional limitiations due to the deficits listed below (see OT problem list). Pt currently complains of pain at the humerus head at 80 degrees ( its a catch in there). Pt noted to have edema to R forearm and given bed level exercises to complete. Pt requesting [facility] as she has gone to this location prior.  Pt will benefit from skilled OT to increase their independence and safety with adls and balance to allow discharge SNF.       Recommendations for follow up therapy are one component of a multi-disciplinary discharge planning process, led by the attending physician.  Recommendations may be updated based on patient status, additional functional criteria and insurance authorization.   Follow Up Recommendations  SNF (wants [facility])    Equipment Recommendations  Other (comment) (RW)    Recommendations for Other Services       Precautions / Restrictions Precautions Precautions: Fall Precaution Comments: monitor HR      Mobility Bed Mobility Overal bed mobility: Needs Assistance Bed Mobility: Supine to Sit     Supine to sit: Min guard     General bed mobility comments: up in bathroom with NT    Transfers Overall transfer level: Needs assistance Equipment used: Rolling walker (2 wheeled) Transfers: Sit to/from Stand Sit to Stand: Min  guard         General transfer comment: holding therapist min (A) for stepping. pt able to static standing min guard. pt with sway with vision occlusion    Balance Overall balance assessment: Needs assistance Sitting-balance support: Bilateral upper extremity supported;Feet supported Sitting balance-Leahy Scale: Good     Standing balance support: Single extremity supported;During functional activity Standing balance-Leahy Scale: Fair Standing balance comment: reliant on bil UE                           ADL either performed or assessed with clinical judgement   ADL Overall ADL's : Needs assistance/impaired     Grooming: Wash/dry face;Minimal assistance;Sitting   Upper Body Bathing: Minimal assistance;Sitting   Lower Body Bathing: Maximal assistance;Sit to/from stand   Upper Body Dressing : Minimal assistance;Sitting   Lower Body Dressing: Maximal assistance;Sit to/from stand   Toilet Transfer: Minimal assistance;BSC             General ADL Comments: pt holding strongly to therapist with R hand due to fear of falling     Vision Patient Visual Report: No change from baseline       Perception     Praxis      Pertinent Vitals/Pain Pain Assessment: Faces Faces Pain Scale: Hurts little more Pain Location: R UE with IV infiltrate Pain Descriptors / Indicators: Grimacing;Guarding Pain Intervention(s): Monitored during session;Repositioned     Hand Dominance Right   Extremity/Trunk Assessment Upper Extremity Assessment Upper Extremity Assessment: RUE deficits/detail RUE Deficits /  Details: pain at the shoulder with any shoulder flexion activation. states 'it has a catch in there" pt once RUE on L UE able to fully raise past 90 degrees but with discomfort at the shoulder joint and 80 degrees flexion RUE Sensation:  (edema throughout forearm) RUE Coordination: decreased fine motor;decreased gross motor       Cervical / Trunk Assessment Cervical /  Trunk Assessment: Normal   Communication Communication Communication: Expressive difficulties   Cognition Arousal/Alertness: Awake/alert Behavior During Therapy: WFL for tasks assessed/performed Overall Cognitive Status: Within Functional Limits for tasks assessed                                     General Comments  VSS    Exercises     Shoulder Instructions      Home Living Family/patient expects to be discharged to:: Private residence Living Arrangements: Alone Available Help at Discharge: Family;Available PRN/intermittently Type of Home: House Home Access: Stairs to enter [employer] of Steps: 3-4 Entrance Stairs-Rails: Right Home Layout: One level     Bathroom Shower/Tub: Occupational psychologist: Handicapped height     Home Equipment: Grab bars - tub/shower;Cane - single point;Walker - 2 wheels;Shower seat - built in   Additional Comments: daughter can assist prn      Prior Functioning/Environment Level of Independence: Independent                 OT Problem List: Impaired balance (sitting and/or standing);Decreased safety awareness;Decreased activity tolerance;Cardiopulmonary status limiting activity      OT Treatment/Interventions: Self-care/ADL training;Therapeutic exercise;DME and/or AE instruction;Therapeutic activities;Balance training;Patient/family education    OT Goals(Current goals can be found in the care plan section) Acute Rehab OT Goals Patient Stated Goal: to return to independent OT Goal Formulation: With patient Time For Goal Achievement: 04/07/21 Potential to Achieve Goals: Good  OT Frequency: Min 2X/week   Barriers to D/C:            Co-evaluation              AM-PAC OT "6 Clicks" Daily Activity     Outcome Measure Help from another person eating meals?: A Lot Help from another person taking care of personal grooming?: A Little Help from another person toileting, which includes  using toliet, bedpan, or urinal?: A Little Help from another person bathing (including washing, rinsing, drying)?: A Little Help from another person to put on and taking off regular upper body clothing?: A Little Help from another person to put on and taking off regular lower body clothing?: A Little 6 Click Score: 17   End of Session Nurse Communication: Mobility status;Precautions  Activity Tolerance: Patient tolerated treatment well Patient left: in bed;with call bell/phone within reach;with bed alarm set  OT Visit Diagnosis: Unsteadiness on feet (R26.81);Muscle weakness (generalized) (M62.81);Dizziness and giddiness (R42)                Time: 9798-9211 OT Time Calculation (min): 25 min Charges:  OT General Charges $OT Visit: 1 Visit OT Evaluation $OT Eval Moderate Complexity: 1 Mod   Robin [name], OTR/L  Acute Rehabilitation Services Pager: [phone] Office: [phone] .   Jeri Modena 03/24/2021, 4:08 PM

## 2021-03-24 NOTE — Progress Notes (Signed)
STROKE TEAM PROGRESS NOTE     INTERVAL HISTORY Patient in bed NAD. Cleviprex drip is off.. Patient is speaking much better, but still with some word finding difficulty.  She has right forearm pain due to IV infiltration swelling She is tolerating clear liquid diet.  Plan to transfer out of ICU later today and transferred back to the medical hospitalist service.  Vital signs stable. Vitals:   03/24/21 0600 03/24/21 0700 03/24/21 0800 03/24/21 0900  BP: 122/68 (!) 143/87 (!) 145/69 (!) 151/75  Pulse: 77 80 70   Resp: 16 13 18 15   Temp:   99.7 F (37.6 C)   TempSrc:   Oral   SpO2: 93% 96% 96%   Height:       CBC:  Recent Labs  Lab 03/22/21 0505 03/23/21 0317 03/24/21 0256  WBC 9.1 7.7 9.3  NEUTROABS 6.5 7.5  --   HGB 13.4 12.7 11.7*  HCT 40.3 38.9 35.1*  MCV 86.9 86.6 86.2  PLT 225 276 469   Basic Metabolic Panel:  Recent Labs  Lab 03/22/21 0505 03/23/21 0317 03/24/21 0256  NA 133* 137 134*  K 3.1* 3.1* 3.0*  CL 98 104 102  CO2 25 22 25   GLUCOSE 99 138* 103*  BUN 14 8 12   CREATININE 0.53 0.63 0.57  CALCIUM 9.1 9.1 8.6*  MG 1.7 1.6*  --   PHOS  --  2.8  --    Lipid Panel:  Recent Labs  Lab 03/23/21 0317  CHOL 141  TRIG 98  HDL 41  CHOLHDL 3.4  VLDL 20  LDLCALC 80   HgbA1c:  Recent Labs  Lab 03/23/21 0317  HGBA1C 5.3    IMAGING past 24 hours CT HEAD WO CONTRAST (5MM)  Result Date: 03/24/2021 CLINICAL DATA:  Follow-up examination for stroke. EXAM: CT HEAD WITHOUT CONTRAST TECHNIQUE: Contiguous axial images were obtained from the base of the skull through the vertex without intravenous contrast. COMPARISON:  Prior CT and MRI from 03/22/2021. FINDINGS: Brain: Age-related cerebral atrophy with moderate chronic microvascular ischemic disease. Previously identified acute ischemic infarct involving the posterior cortical and subcortical left parietal lobe again seen, relatively similar in size and distribution as compared to previous MRI. No associated  hemorrhage or mass effect. No other visible acute or evolving large vessel ischemic changes. No mass lesion or midline shift. No hydrocephalus or extra-axial fluid collection. Vascular: No hyperdense vessel. Skull: Scalp soft tissues demonstrate no acute finding. Calvarium intact. Sinuses/Orbits: Globes and orbital soft tissues demonstrate no acute finding. Paranasal sinuses mastoid air cells are clear. Other: None. IMPRESSION: 1. Continued interval evolution in early subacute left posterior MCA distribution infarct, relatively stable in size and appearance as compared to previous MRI. No significant mass effect or evidence for hemorrhagic transformation. 2. No other new acute intracranial abnormality. 3. Age-related cerebral atrophy with moderate chronic microvascular ischemic disease. Electronically Signed   By: Jeannine Boga M.D.   On: 03/24/2021 00:58   ECHOCARDIOGRAM COMPLETE  Result Date: 03/23/2021    ECHOCARDIOGRAM REPORT   Patient Name:   JAMEILA KEENY Date of Exam: 03/23/2021 Medical Rec #:  [id number]          Height:       64.0 in Accession #:    [id number]         Weight:       130.8 lb Date of Birth:  04-27-1935         BSA:  1.633 m Patient Age:    86 years           BP:           148/65 mmHg Patient Gender: F                  HR:           52 bpm. Exam Location:  Inpatient Procedure: 2D Echo, Cardiac Doppler and Color Doppler Indications:    Stroke I63.9  History:        Patient has prior history of Echocardiogram examinations, most                 recent 11/15/2020. Arrythmias:Atrial Fibrillation; Risk                 Factors:Hypertension.  Sonographer:    Merrie Roof RDCS Referring Phys: [id number] ASHISH ARORA IMPRESSIONS  1. Left ventricular ejection fraction, by estimation, is 60 to 65%. The left ventricle has normal function. The left ventricle has no regional wall motion abnormalities. Left ventricular diastolic function could not be evaluated.  2. Right ventricular systolic  function is normal. The right ventricular size is normal. There is mildly elevated pulmonary artery systolic pressure.  3. Right atrial size was mild to moderately dilated.  4. The mitral valve is grossly normal. Trivial mitral valve regurgitation.  5. The aortic valve is normal in structure. Aortic valve regurgitation is not visualized. FINDINGS  Left Ventricle: Left ventricular ejection fraction, by estimation, is 60 to 65%. The left ventricle has normal function. The left ventricle has no regional wall motion abnormalities. The left ventricular internal cavity size was normal in size. There is  no left ventricular hypertrophy. Left ventricular diastolic function could not be evaluated due to atrial fibrillation. Left ventricular diastolic function could not be evaluated. Right Ventricle: The right ventricular size is normal. Right vetricular wall thickness was not well visualized. Right ventricular systolic function is normal. There is mildly elevated pulmonary artery systolic pressure. The tricuspid regurgitant velocity  is 2.88 m/s, and with an assumed right atrial pressure of 3 mmHg, the estimated right ventricular systolic pressure is 95.1 mmHg. Left Atrium: Left atrial size was normal in size. Right Atrium: Right atrial size was mild to moderately dilated. Pericardium: There is no evidence of pericardial effusion. Mitral Valve: The mitral valve is grossly normal. Trivial mitral valve regurgitation. Tricuspid Valve: The tricuspid valve is grossly normal. Tricuspid valve regurgitation is mild. Aortic Valve: The aortic valve is normal in structure. Aortic valve regurgitation is not visualized. Aortic valve mean gradient measures 4.0 mmHg. Aortic valve peak gradient measures 7.7 mmHg. Aortic valve area, by VTI measures 1.55 cm. Pulmonic Valve: The pulmonic valve was grossly normal. Pulmonic valve regurgitation is trivial. Aorta: The aortic root and ascending aorta are structurally normal, with no evidence of  dilitation. IAS/Shunts: The atrial septum is grossly normal.  LEFT VENTRICLE PLAX 2D LVIDd:         3.40 cm LVIDs:         2.20 cm LV PW:         1.00 cm LV IVS:        1.00 cm LVOT diam:     1.80 cm LV SV:         44 LV SV Index:   27 LVOT Area:     2.54 cm  RIGHT VENTRICLE RV Basal diam:  2.90 cm LEFT ATRIUM           Index  RIGHT ATRIUM           Index LA diam:      3.60 cm 2.20 cm/m   RA Area:     24.70 cm LA Vol (A2C): 49.1 ml 30.06 ml/m  RA Volume:   78.50 ml  48.06 ml/m LA Vol (A4C): 40.6 ml 24.83 ml/m  AORTIC VALVE AV Area (Vmax):    1.80 cm AV Area (Vmean):   1.74 cm AV Area (VTI):     1.55 cm AV Vmax:           139.00 cm/s AV Vmean:          99.300 cm/s AV VTI:            0.286 m AV Peak Grad:      7.7 mmHg AV Mean Grad:      4.0 mmHg LVOT Vmax:         98.25 cm/s LVOT Vmean:        67.850 cm/s LVOT VTI:          0.174 m LVOT/AV VTI ratio: 0.61  AORTA Ao Root diam: 3.10 cm Ao Asc diam:  3.20 cm TRICUSPID VALVE TR Peak grad:   33.2 mmHg TR Vmax:        288.00 cm/s  SHUNTS Systemic VTI:  0.17 m Systemic Diam: 1.80 cm Mertie Moores MD Electronically signed by Mertie Moores MD Signature Date/Time: 03/23/2021/2:24:18 PM    Final     PHYSICAL EXAM Physical Exam  Constitutional: Appears well-developed and well-nourished.  Psych: Affect appropriate to situation Eyes: Normal external eye and conjunctiva. HENT: Normocephalic, no lesions, without obvious abnormality.   Musculoskeletal-no joint tenderness, deformity or swelling Cardiovascular: Normal rate and regular rhythm.  Respiratory: Effort normal, non-labored breathing saturations WNL GI: Soft.  No distension. There is no tenderness.  Skin: WDI right forearm swelling with tenderness and slight redness.   Neuro:  Mental Status: Alert, oriented, thought content appropriate.  Speech fluent without evidence of aphasia.  Very rare word finding difficulty that becomes more apparent the longer she talks. Able to follow simple commands.   Cranial Nerves: II:  Visual fields grossly normal,  III,IV, VI: ptosis not present, extra-ocular motions intact bilaterally pupils equal, round, reactive to light and accommodation V,VII: smile symmetric, facial light touch sensation normal bilaterally VIII: hearing normal bilaterally IX,X: uvula rises symmetrically XI: bilateral shoulder shrug XII: midline tongue extension Motor: Right : Upper extremity   4/5  Left:     Upper extremity   4/5  Lower extremity   5/5   Lower extremity   5/5 Tone and bulk:normal tone throughout; no atrophy noted rUE motion limited by shoulder pain.  Sensory:  light touch intact throughout, bilaterally Cerebellar: No ataxia noted Gait: deferred     ASSESSMENT/PLAN [name] NEEDS is a 86 y.o. female with history of a. Fib,  GI bleed ( may 2022), sbo presenting with aphasia and weakness s/p exploratory lap.   Stroke:  left parietal area infarct embolic secondary to a. Fib and no anticoagulation d/t surgery.  Code Stroke  CT head No acute finding ASPECTS 10.    MRI  Acute infarction affecting a 3 cm region of the cortical and subcortical brain in the left parietal area.  IR: Occluded angular branch of LT MCA inf division  in the M3/M4 region with robust collaterals. Moderate to severe  FMD changes of  the  mid third Lt ICA  associated with 2 pseudoaneurysms. Repeat head ct: 03/24/2021  shows continued evolution of early subacute left posterior MCA branch infarct.  Stable in size.  No hemorrhage. 2D Echo left ventricular ejection fraction 60 to 65%.  No wall motion abnormalities.  LDL 80 HgbA1c 5.3 VTE prophylaxis - SCd's Eliquis (apixaban) daily prior to admission, now on no anticoagulation. Pending repeat CTH, 03/24/21 may restart eliquis today. Discussed with Dr. Bobbye Morton, she is OK with eliquis Therapy recommendations:  SNF Disposition:  pending  Hypertension Home meds:  metoprolol XR Stable Wean off cleviprex today 10/6 Permissive  hypertension (OK if < 220/120) but gradually normalize in 5-7 days Long-term BP goal normotensive  Hyperlipidemia Home meds:  none,  LDL 80, goal < 70  Diabetes type II Controlled (no diagnosis) Home meds:  none HgbA1c 5.3, goal < 7.0 CBGs Recent Labs    03/22/21 1417  GLUCAP 107*     Other Stroke Risk Factors Advanced Age >/= 12  Family Hx stroke  Other Active Problems Right shoulder pain from previous fall>>>> shoulder xray ordered 10/6 Small bowel obstruction - surgery on board - on clear liquid  Hospital day # 9 Continue mobilization out of bed.  Therapy consults.  Hospital floor bed.  Start Eliquis for secondary stroke prevention for A. fib.     We will asked medical hospitalist team to take over.  Resume home medications after approval from general surgery team.  Discussed with general surgery PA.This patient is critically ill and at significant risk of neurological worsening, death and care requires constant monitoring of vital signs, hemodynamics,respiratory and cardiac monitoring, extensive review of multiple databases, frequent neurological assessment, discussion with family, other specialists and medical decision making of high complexity.I have made any additions or clarifications directly to the above note.This critical care time does not reflect procedure time, or teaching time or supervisory time of PA/NP/Med Resident etc but could involve care discussion time.  I spent 30 minutes of neurocritical care time  in the care of  this patient.   Antony Contras MD      To contact Stroke Continuity provider, please refer to [email or web address] After hours, contact General Neurology

## 2021-03-24 NOTE — TOC Initial Note (Signed)
Transition of Care [facility]) - Initial/Assessment Note    Patient Details  Name: Robin [name] MRN: [id number] Date of Birth: 01/11/1935  Transition of Care [facility]) CM/SW Contact:    Geralynn Ochs, LCSW Phone Number: 03/24/2021, 4:12 PM  Clinical Narrative:       CSW notified by OT that patient is requesting [facility] for SNF, as she had been there before. CSW completed referral and sent over to [facility] to review. CSW to follow.            Expected Discharge Plan: Skilled Nursing Facility Barriers to Discharge: Continued Medical Work up, Ship broker   Patient Goals and CMS Choice Patient states their goals for this hospitalization and ongoing recovery are:: to get back home CMS Medicare.gov Compare Post Acute Care list provided to:: Patient Choice offered to / list presented to : Patient  Expected Discharge Plan and Services Expected Discharge Plan: [place] Choice: [place] Living arrangements for the past 2 months: Single Family Home                                      Prior Living Arrangements/Services Living arrangements for the past 2 months: Single Family Home Lives with:: Self Patient language and need for interpreter reviewed:: No Do you feel safe going back to the place where you live?: Yes      Need for Family Participation in Patient Care: No (Comment) Care giver support system in place?: No (comment)   Criminal Activity/Legal Involvement Pertinent to Current Situation/Hospitalization: No - Comment as needed  Activities of Daily Living Home Assistive Devices/Equipment: None ADL Screening (condition at time of admission) Patient's cognitive ability adequate to safely complete daily activities?: Yes Is the patient deaf or have difficulty hearing?: No Does the patient have difficulty seeing, even when wearing glasses/contacts?: No Does the patient have difficulty concentrating,  remembering, or making decisions?: No Patient able to express need for assistance with ADLs?: Yes Does the patient have difficulty dressing or bathing?: No Independently performs ADLs?: Yes (appropriate for developmental age) Does the patient have difficulty walking or climbing stairs?: No Weakness of Legs: None Weakness of Arms/Hands: None  Permission Sought/Granted Permission sought to share information with : Facility Sport and exercise psychologist, Family Supports Permission granted to share information with : Yes, Verbal Permission Granted  Share Information with NAME: [name]  Permission granted to share info w AGENCY: SNF  Permission granted to share info w Relationship: Daughter     Emotional Assessment Appearance:: Appears stated age Attitude/Demeanor/Rapport: Engaged Affect (typically observed): Appropriate Orientation: : Oriented to Self, Oriented to Place, Oriented to  Time, Oriented to Situation Alcohol / Substance Use: Not Applicable Psych Involvement: No (comment)  Admission diagnosis:  Small bowel obstruction ([place]) [K56.609] SBO (small bowel obstruction) ([place]) [K56.609] Stroke (cerebrum) ([place]) [I63.9] Middle cerebral artery embolism, left [I66.02] Intestinal obstruction, unspecified cause, unspecified whether partial or complete ([place]) [K56.609] Patient Active Problem List   Diagnosis Date Noted   Cerebral embolism with cerebral infarction 03/22/2021   Stroke (cerebrum) ([place]) 03/22/2021   Middle cerebral artery embolism, left 03/22/2021   Obstructed internal hernia 03/15/2021   SBO (small bowel obstruction) ([place]) 03/15/2021   Persistent atrial fibrillation ([place]) 12/30/2020   Lower extremity edema 12/30/2020   Irritable bowel syndrome with diarrhea 11/23/2020   Decreased estrogen level 11/23/2020   Hypertension 11/23/2020   Kidney  stone 11/23/2020   Solitary pulmonary nodule 11/23/2020   Vitamin D deficiency 11/23/2020   Hyperlipidemia 11/23/2020   Hypercalcemia  11/23/2020   Pressure injury of skin 11/15/2020   Protein-calorie malnutrition, severe ([place]) 11/11/2020   Hypophosphatemia 11/03/2020   Cecal cancer & polyps s/p lap hemicolectomy 10/31/2020 10/31/2020   High grade dysplasia in colonic adenomas transverse colon & splenic flexure 10/31/2020   Colon cancer ([place]) 10/31/2020   Colonic mass    GI bleed 10/27/2020   Anemia 10/26/2020   Chronic anticoagulation 02/12/2019   Venous insufficiency (chronic) (peripheral) 02/12/2019   Normal coronary arteries 02/12/2019   Carotid stenosis 03/02/2017   Paroxysmal atrial fibrillation ([place]) 08/05/2015   First degree heart block by electrocardiogram 06/03/2014   Hypokalemia 03/02/2014   PAC (premature atrial contraction) 05/05/2012   Hypercholesterolemia 06/13/2011   Benign hypertensive heart disease without heart failure 12/11/2010   PCP:  [name], PA-C Pharmacy:   [facility], [place] [address] Phone: [phone] Fax: [phone]     Social Determinants of Health (SDOH) Interventions    Readmission Risk Interventions Readmission Risk Prevention Plan 11/17/2020  Transportation Screening Complete  HRI or Home Care Consult Complete  Social Work Consult for [place] Planning/Counseling Complete  Palliative Care Screening Not Applicable  Medication Review Press photographer) Complete  Some recent data might be hidden

## 2021-03-24 NOTE — Plan of Care (Signed)
  Problem: Clinical Measurements: Goal: Will remain free from infection Outcome: Progressing Goal: Respiratory complications will improve Outcome: Progressing   Problem: Activity: Goal: Risk for activity intolerance will decrease Outcome: Progressing   Problem: Nutrition: Goal: Adequate nutrition will be maintained Outcome: Progressing   Problem: Elimination: Goal: Will not experience complications related to bowel motility Outcome: Progressing Goal: Will not experience complications related to urinary retention Outcome: Progressing   Problem: Pain Managment: Goal: General experience of comfort will improve Outcome: Progressing   Problem: Skin Integrity: Goal: Risk for impaired skin integrity will decrease Outcome: Progressing

## 2021-03-24 NOTE — Progress Notes (Signed)
ANTICOAGULATION CONSULT NOTE - Initial Consult  Pharmacy Consult for apixaban Indication: atrial fibrillation  Allergies  Allergen Reactions   Iodinated Diagnostic Agents Shortness Of Breath   Amlodipine     Shortness of breath   Codeine Nausea Only    Tolerates Dilaudid fine   Diphenhydramine Hcl     Hypertension    Pravastatin     Muscle aches   Zetia [Ezetimibe]    Sulfa Antibiotics Rash   Sulfa Drugs Cross Reactors Rash    Patient Measurements: Height: 5\' 4"  (162.6 cm) IBW/kg (Calculated) : 54.7   Vital Signs: Temp: 98 F (36.7 C) (10/07 1200) Temp Source: Oral (10/07 1200) BP: 139/70 (10/07 1400) Pulse Rate: 87 (10/07 1400)  Labs: Recent Labs    03/22/21 0505 03/23/21 0317 03/24/21 0256  HGB 13.4 12.7 11.7*  HCT 40.3 38.9 35.1*  PLT 225 276 248  CREATININE 0.53 0.63 0.57     CrCl cannot be calculated (Unknown ideal weight.).   Medical History: Past Medical History:  Diagnosis Date   Anxiety    AV bloc first degree    Carotid stenosis 03/02/2017   Hyperlipidemia    Hypertension    Lower extremity edema 12/30/2020   Lumbar pain    fell off of her horse and has low back pain at times   Persistent atrial fibrillation ([place]) 12/30/2020   Venous insufficiency of both lower extremities    Vertigo     Medications:  Medications Prior to Admission  Medication Sig Dispense Refill Last Dose   acetaminophen (TYLENOL) 325 MG tablet Take 2 tablets (650 mg total) by mouth every 6 (six) hours as needed for mild pain, fever or headache.   Past Week   Cholecalciferol (VITAMIN D) 50 MCG (2000 UT) tablet Take 2,000 Units by mouth at bedtime.   03/14/2021 at pm   diphenoxylate-atropine (LOMOTIL) 2.5-0.025 MG tablet Take 1 tablet by mouth every 12 (twelve) hours as needed for diarrhea or loose stools (for diarrhea). 30 tablet 10 03/14/2021 at 1000   ELIQUIS 5 MG TABS tablet TAKE 1 TABLET BY MOUTH TWICE DAILY. 180 tablet 1 03/15/2021 at 0800   estradiol (ESTRACE) 1 MG  tablet Take 0.5 mg by mouth daily.   03/15/2021 at am   ferrous sulfate 325 (65 FE) MG tablet Take 1 tablet (325 mg total) by mouth 2 (two) times daily with a meal. (Patient taking differently: Take 325 mg by mouth at bedtime.) 60 tablet 0 03/14/2021 at pm   Lactobacillus Rhamnosus, GG, (CULTURELLE PO) Take 1 capsule by mouth in the morning.   03/15/2021 at am   Lutein 20 MG CAPS Take 1 capsule by mouth every evening.   03/14/2021 at pm   metoprolol succinate (TOPROL-XL) 50 MG 24 hr tablet Take 0.5-2 tablets (25-100 mg total) by mouth 2 (two) times daily. Take 100mg  in am and 25mg  in evening (Patient taking differently: Take 25-100 mg by mouth 2 (two) times daily. Take 100mg  by mouth in morning and 25mg  in evening as needed for heart rate above 90 BPM) 60 tablet 0 03/15/2021 at 0800   mometasone (ELOCON) 0.1 % lotion Apply 1 drop topically at bedtime. To forehead   03/14/2021 at pm   ondansetron (ZOFRAN ODT) 4 MG disintegrating tablet Take 1 tablet (4 mg total) by mouth every 8 (eight) hours as needed for nausea or vomiting. 20 tablet 0 03/15/2021 at am   polycarbophil (FIBERCON) 625 MG tablet Take 1 tablet (625 mg total) by mouth 2 (two) times  daily. 60 tablet 10 03/15/2021   potassium chloride SA (KLOR-CON) 20 MEQ tablet Take 1 tablet (20 mEq total) by mouth 2 (two) times daily.   03/15/2021 at am   TIADYLT ER 180 MG 24 hr capsule TAKE (1) CAPSULE DAILY. (Patient taking differently: Take 180 mg by mouth daily.) 90 capsule 2 03/15/2021 at 1300   triamcinolone cream (KENALOG) 0.1 % Apply 1 application topically daily as needed (for rash).    03/14/2021 at pm   triamterene-hydrochlorothiazide (MAXZIDE-25) 37.5-25 MG tablet Take 1 tablet by mouth daily.   03/15/2021 at am   vitamin A 10000 UNIT capsule Take 10,000 Units by mouth daily.   03/15/2021 at am   Scheduled:    stroke: mapping our early stages of recovery book   Does not apply Once   apixaban  5 mg Oral BID   aspirin EC  325 mg Oral Daily   Chlorhexidine  Gluconate Cloth  6 each Topical Daily   feeding supplement  237 mL Oral BID BM   lip balm  1 application Topical BID   metoprolol succinate  50 mg Oral Daily   pantoprazole  40 mg Oral Daily   potassium chloride  40 mEq Oral TID   sodium chloride flush  3 mL Intravenous Q12H    Assessment: 86 yo female with stroke, She was on apixaban PTA for afib and plans are to restart today. -SCr= 0.5 -hg= 11.7   Goal of Therapy:  Monitor platelets by anticoagulation protocol: Yes   Plan:  -discontinue heparin sq -Start apixaban 5mg  po bid -Per Dr. Leonie Man: aspirin can be discontinued  Hildred Laser, PharmD Clinical Pharmacist **Pharmacist phone directory can now be found on [email or web address] ([email or web address]).  Listed under [place].

## 2021-03-25 DIAGNOSIS — R197 Diarrhea, unspecified: Secondary | ICD-10-CM | POA: Diagnosis present

## 2021-03-25 LAB — CBC
HCT: 37.1 % (ref 36.0–46.0)
Hemoglobin: 12.1 g/dL (ref 12.0–15.0)
MCH: 28.5 pg (ref 26.0–34.0)
MCHC: 32.6 g/dL (ref 30.0–36.0)
MCV: 87.3 fL (ref 80.0–100.0)
Platelets: 292 10*3/uL (ref 150–400)
RBC: 4.25 MIL/uL (ref 3.87–5.11)
RDW: 13.9 % (ref 11.5–15.5)
WBC: 8.4 10*3/uL (ref 4.0–10.5)
nRBC: 0 % (ref 0.0–0.2)

## 2021-03-25 LAB — BASIC METABOLIC PANEL
Anion gap: 4 — ABNORMAL LOW (ref 5–15)
BUN: 14 mg/dL (ref 8–23)
CO2: 26 mmol/L (ref 22–32)
Calcium: 8.9 mg/dL (ref 8.9–10.3)
Chloride: 105 mmol/L (ref 98–111)
Creatinine, Ser: 0.57 mg/dL (ref 0.44–1.00)
GFR, Estimated: >60 mL/min (ref >60)
Glucose, Bld: 107 mg/dL — ABNORMAL HIGH (ref 70–99)
Potassium: 4 mmol/L (ref 3.5–5.1)
Sodium: 135 mmol/L (ref 135–145)

## 2021-03-25 NOTE — Assessment & Plan Note (Addendum)
Code Stroke  CT head No acute finding MRI  Acute infarction affecting a 3 cm region of the cortical and subcortical brain in the left parietal area.  IR: Occluded angular branch of LT MCA inf division in the M3/M4 region with robust collaterals. Moderate to severe FMD changes of the mid third Lt ICA associated with 2 pseudoaneurysms. Repeat head ct: 03/24/2021 shows continued evolution of early subacute left posterior MCA branch infarct.  Stable in size.  No hemorrhage. 2D Echo left ventricular ejection fraction 60 to 65%.  No wall motion abnormalities.  LDL 80 HgbA1c 5.3 Eliquis (apixaban) daily prior to admission, On eliquis 2.5mg  BID due to weight and age.  Therapy recommendations:  SNF IR recommends 2 to 3-week outpatient follow-up.

## 2021-03-25 NOTE — Progress Notes (Signed)
STROKE TEAM PROGRESS NOTE   Doing well. Transferred to hospitalist service with surgery following.   Doing well from stroke standpoint, feels symptoms are almost gone. Has some trouble with thinking/memory.  Vitals:   03/24/21 2300 03/25/21 0000 03/25/21 0042 03/25/21 0749  BP: (!) 148/67 (!) 148/71 (!) 144/74 (!) 146/83  Pulse: 88 92 92 95  Resp: (!) 27 (!) 22 17 17   Temp:   (!) 97.5 F (36.4 C) (!) 97.5 F (36.4 C)  TempSrc:   Oral Oral  SpO2: 95% 96% 97% 99%  Height:       CBC:  Recent Labs  Lab 03/22/21 0505 03/23/21 0317 03/24/21 0256 03/25/21 0047  WBC 9.1 7.7 9.3 8.4  NEUTROABS 6.5 7.5  --   --   HGB 13.4 12.7 11.7* 12.1  HCT 40.3 38.9 35.1* 37.1  MCV 86.9 86.6 86.2 87.3  PLT 225 276 248 622   Basic Metabolic Panel:  Recent Labs  Lab 03/22/21 0505 03/23/21 0317 03/24/21 0256 03/25/21 0047  NA 133* 137 134* 135  K 3.1* 3.1* 3.0* 4.0  CL 98 104 102 105  CO2 25 22 25 26   GLUCOSE 99 138* 103* 107*  BUN 14 8 12 14   CREATININE 0.53 0.63 0.57 0.57  CALCIUM 9.1 9.1 8.6* 8.9  MG 1.7 1.6*  --   --   PHOS  --  2.8  --   --    Lipid Panel:  Recent Labs  Lab 03/23/21 0317  CHOL 141  TRIG 98  HDL 41  CHOLHDL 3.4  VLDL 20  LDLCALC 80   HgbA1c:  Recent Labs  Lab 03/23/21 0317  HGBA1C 5.3    IMAGING past 24 hours No results found.  PHYSICAL EXAM  Neuro:  Mental Status: Alert, oriented, thought content appropriate.  Speech fluent without evidence of aphasia.  follow commands.  Cranial Nerves: II:  Visual fields grossly normal,  III,IV, VI: ptosis not present, extra-ocular motions intact bilaterally pupils equal, round, reactive to light and accommodation V,VII: smile symmetric, facial light touch sensation normal bilaterally VIII: hearing normal bilaterally XII: midline tongue extension Motor: Right Upper extremity   5/5 distally. Not testing proximally due to shoulder pain.  Left: Upper extremity   4+/5 Lower extremity   5/5 Lower extremity    5/5 Tone and bulk:normal tone throughout; no atrophy noted rUE motion limited by shoulder pain.  Sensory:  light touch intact throughout, bilaterally Cerebellar: No ataxia noted. FTN normal. Gait: deferred     ASSESSMENT/PLAN [name] is a 86 y.o. female with history of a. Fib,  GI bleed ( may 2022), sbo presenting with aphasia and weakness s/p exploratory lap.   Stroke:  left parietal area infarct embolic secondary to a. Fib and no anticoagulation d/t surgery.  Code Stroke  CT head No acute finding ASPECTS 10.    MRI  Acute infarction affecting a 3 cm region of the cortical and subcortical brain in the left parietal area.  IR: Occluded angular branch of LT MCA inf division  in the M3/M4 region with robust collaterals. Moderate to severe  FMD changes of  the  mid third Lt ICA  associated with 2 pseudoaneurysms. Repeat head ct: 03/24/2021 shows continued evolution of early subacute left posterior MCA branch infarct.  Stable in size.  No hemorrhage. 2D Echo left ventricular ejection fraction 60 to 65%.  No wall motion abnormalities.  LDL 80 HgbA1c 5.3 VTE prophylaxis - SCd's Eliquis (apixaban) daily prior to admission,  now on no anticoagulation. Pending repeat CTH, 03/24/21 may restart eliquis. On eliquis 5mg  BID. Therapy recommendations:  SNF Disposition:  pending  Hypertension Home meds:  metoprolol XR Stable Permissive hypertension (OK if < 220/120) but gradually normalize in 5-7 days Long-term BP goal normotensive  Hyperlipidemia Home meds:  none,  LDL 80, goal < 70  Diabetes type II Controlled (no diagnosis) Home meds:  none HgbA1c 5.3, goal < 7.0 CBGs Recent Labs    03/22/21 1417  GLUCAP 107*     Other Stroke Risk Factors Advanced Age >/= 67  Family Hx stroke    Hospital day # 10  Total of 30 mins spent reviewing chart, discussion with patient on prognosis, Dx and plan. Discussed case with patient's nurse. Reviewed Imaging personally.    To  contact Stroke Continuity provider, please refer to [email or web address] After hours, contact General Neurology

## 2021-03-25 NOTE — Progress Notes (Signed)
Assessment & Plan: POD#8 - s/p ex lap with LOA by Dr. Harlow Asa 03/17/2021 for SBO - DYS 1 diet - continue therapies, current recs for SNF, may go to CIR - staples may be removed and steri-strips applied at discharge Hypokalemia, mild - resolved Stroke - pt doing well from this standpoint, stroke team has been managing Fall - R shoulder films negative; ecchymosis right wrist RUE thrombophlebitis - warm compresses   FEN - DYS 1 diet VTE - SQ heparin , ok to resume DOAC ID - none needed currently   Paroxysmal A. Fib - ok to resume eliquis HTN        Robin Gemma, MD       [facility]       Office: [phone]   Chief Complaint: SBO  Subjective: Patient in bed, comfortable.  Speech near normal.  No complaints.  Tolerating diet.  Objective: Vital signs in last 24 hours: Temp:  [97.5 F (36.4 C)-98.6 F (37 C)] 97.5 F (36.4 C) (10/08 0749) Pulse Rate:  [76-106] 95 (10/08 0749) Resp:  [14-27] 17 (10/08 0749) BP: (122-176)/(66-142) 146/83 (10/08 0749) SpO2:  [80 %-99 %] 99 % (10/08 0749) Last BM Date: 03/24/21  Intake/Output from previous day: 10/07 0701 - 10/08 0700 In: 141.7 [I.V.:141.7] Out: 300 [Urine:300] Intake/Output this shift: Total I/O In: 240 [P.O.:240] Out: -   Physical Exam: HEENT - sclerae clear, mucous membranes moist Neck - soft Chest - clear bilaterally Abdomen - soft, protuberant; midline wound dry and intact with staples in place Ext - no edema, non-tender; ecchymosis right wrist Neuro - alert & oriented  Lab Results:  Recent Labs    03/24/21 0256 03/25/21 0047  WBC 9.3 8.4  HGB 11.7* 12.1  HCT 35.1* 37.1  PLT 248 292   BMET Recent Labs    03/24/21 0256 03/25/21 0047  NA 134* 135  K 3.0* 4.0  CL 102 105  CO2 25 26  GLUCOSE 103* 107*  BUN 12 14  CREATININE 0.57 0.57  CALCIUM 8.6* 8.9   PT/INR No results for input(s): LABPROT, INR in the last 72 hours. Comprehensive Metabolic Panel:    Component Value  Date/Time   NA 135 03/25/2021 0047   NA 134 (L) 03/24/2021 0256   NA 139 02/21/2018 0854   NA 137 02/27/2017 1205   K 4.0 03/25/2021 0047   K 3.0 (L) 03/24/2021 0256   CL 105 03/25/2021 0047   CL 102 03/24/2021 0256   CO2 26 03/25/2021 0047   CO2 25 03/24/2021 0256   BUN 14 03/25/2021 0047   BUN 12 03/24/2021 0256   BUN 20 02/21/2018 0854   BUN 12 02/27/2017 1205   CREATININE 0.57 03/25/2021 0047   CREATININE 0.57 03/24/2021 0256   CREATININE 0.54 (L) 11/28/2015 1422   CREATININE 0.71 08/19/2015 0905   GLUCOSE 107 (H) 03/25/2021 0047   GLUCOSE 103 (H) 03/24/2021 0256   CALCIUM 8.9 03/25/2021 0047   CALCIUM 8.6 (L) 03/24/2021 0256   AST 24 03/15/2021 1925   AST 20 11/24/2020 0320   ALT 21 03/15/2021 1925   ALT 21 11/24/2020 0320   ALKPHOS 113 03/15/2021 1925   ALKPHOS 175 (H) 11/24/2020 0320   BILITOT 1.9 (H) 03/15/2021 1925   BILITOT 0.9 11/24/2020 0320   PROT 7.7 03/15/2021 1925   PROT 5.6 (L) 11/24/2020 0320   ALBUMIN 4.7 03/15/2021 1925   ALBUMIN 2.6 (L) 11/24/2020 0320    Studies/Results: CT HEAD WO CONTRAST (5MM)  Result Date: 03/24/2021 CLINICAL DATA:  Follow-up examination for stroke. EXAM: CT HEAD WITHOUT CONTRAST TECHNIQUE: Contiguous axial images were obtained from the base of the skull through the vertex without intravenous contrast. COMPARISON:  Prior CT and MRI from 03/22/2021. FINDINGS: Brain: Age-related cerebral atrophy with moderate chronic microvascular ischemic disease. Previously identified acute ischemic infarct involving the posterior cortical and subcortical left parietal lobe again seen, relatively similar in size and distribution as compared to previous MRI. No associated hemorrhage or mass effect. No other visible acute or evolving large vessel ischemic changes. No mass lesion or midline shift. No hydrocephalus or extra-axial fluid collection. Vascular: No hyperdense vessel. Skull: Scalp soft tissues demonstrate no acute finding. Calvarium intact.  Sinuses/Orbits: Globes and orbital soft tissues demonstrate no acute finding. Paranasal sinuses mastoid air cells are clear. Other: None. IMPRESSION: 1. Continued interval evolution in early subacute left posterior MCA distribution infarct, relatively stable in size and appearance as compared to previous MRI. No significant mass effect or evidence for hemorrhagic transformation. 2. No other new acute intracranial abnormality. 3. Age-related cerebral atrophy with moderate chronic microvascular ischemic disease. Electronically Signed   By: Jeannine Boga M.D.   On: 03/24/2021 00:58   ECHOCARDIOGRAM COMPLETE  Result Date: 03/23/2021    ECHOCARDIOGRAM REPORT   Patient Name:   Robin [name] Date of Exam: 03/23/2021 Medical Rec #:  [id number]          Height:       64.0 in Accession #:    [id number]         Weight:       130.8 lb Date of Birth:  Oct 16, 1934         BSA:          1.633 m Patient Age:    86 years           BP:           148/65 mmHg Patient Gender: F                  HR:           52 bpm. Exam Location:  Inpatient Procedure: 2D Echo, Cardiac Doppler and Color Doppler Indications:    Stroke I63.9  History:        Patient has prior history of Echocardiogram examinations, most                 recent 11/15/2020. Arrythmias:Atrial Fibrillation; Risk                 Factors:Hypertension.  Sonographer:    Merrie Roof RDCS Referring Phys: [id number] ASHISH ARORA IMPRESSIONS  1. Left ventricular ejection fraction, by estimation, is 60 to 65%. The left ventricle has normal function. The left ventricle has no regional wall motion abnormalities. Left ventricular diastolic function could not be evaluated.  2. Right ventricular systolic function is normal. The right ventricular size is normal. There is mildly elevated pulmonary artery systolic pressure.  3. Right atrial size was mild to moderately dilated.  4. The mitral valve is grossly normal. Trivial mitral valve regurgitation.  5. The aortic valve is normal  in structure. Aortic valve regurgitation is not visualized. FINDINGS  Left Ventricle: Left ventricular ejection fraction, by estimation, is 60 to 65%. The left ventricle has normal function. The left ventricle has no regional wall motion abnormalities. The left ventricular internal cavity size was normal in size. There is  no left ventricular hypertrophy. Left ventricular diastolic function could not be  evaluated due to atrial fibrillation. Left ventricular diastolic function could not be evaluated. Right Ventricle: The right ventricular size is normal. Right vetricular wall thickness was not well visualized. Right ventricular systolic function is normal. There is mildly elevated pulmonary artery systolic pressure. The tricuspid regurgitant velocity  is 2.88 m/s, and with an assumed right atrial pressure of 3 mmHg, the estimated right ventricular systolic pressure is 93.8 mmHg. Left Atrium: Left atrial size was normal in size. Right Atrium: Right atrial size was mild to moderately dilated. Pericardium: There is no evidence of pericardial effusion. Mitral Valve: The mitral valve is grossly normal. Trivial mitral valve regurgitation. Tricuspid Valve: The tricuspid valve is grossly normal. Tricuspid valve regurgitation is mild. Aortic Valve: The aortic valve is normal in structure. Aortic valve regurgitation is not visualized. Aortic valve mean gradient measures 4.0 mmHg. Aortic valve peak gradient measures 7.7 mmHg. Aortic valve area, by VTI measures 1.55 cm. Pulmonic Valve: The pulmonic valve was grossly normal. Pulmonic valve regurgitation is trivial. Aorta: The aortic root and ascending aorta are structurally normal, with no evidence of dilitation. IAS/Shunts: The atrial septum is grossly normal.  LEFT VENTRICLE PLAX 2D LVIDd:         3.40 cm LVIDs:         2.20 cm LV PW:         1.00 cm LV IVS:        1.00 cm LVOT diam:     1.80 cm LV SV:         44 LV SV Index:   27 LVOT Area:     2.54 cm  RIGHT VENTRICLE RV  Basal diam:  2.90 cm LEFT ATRIUM           Index        RIGHT ATRIUM           Index LA diam:      3.60 cm 2.20 cm/m   RA Area:     24.70 cm LA Vol (A2C): 49.1 ml 30.06 ml/m  RA Volume:   78.50 ml  48.06 ml/m LA Vol (A4C): 40.6 ml 24.83 ml/m  AORTIC VALVE AV Area (Vmax):    1.80 cm AV Area (Vmean):   1.74 cm AV Area (VTI):     1.55 cm AV Vmax:           139.00 cm/s AV Vmean:          99.300 cm/s AV VTI:            0.286 m AV Peak Grad:      7.7 mmHg AV Mean Grad:      4.0 mmHg LVOT Vmax:         98.25 cm/s LVOT Vmean:        67.850 cm/s LVOT VTI:          0.174 m LVOT/AV VTI ratio: 0.61  AORTA Ao Root diam: 3.10 cm Ao Asc diam:  3.20 cm TRICUSPID VALVE TR Peak grad:   33.2 mmHg TR Vmax:        288.00 cm/s  SHUNTS Systemic VTI:  0.17 m Systemic Diam: 1.80 cm Mertie Moores MD Electronically signed by Mertie Moores MD Signature Date/Time: 03/23/2021/2:24:18 PM    Final       Robin [name] 03/25/2021   Patient ID: Robin [name], female   DOB: [birth year], 86 y.o.   MRN: [id number]

## 2021-03-25 NOTE — Progress Notes (Addendum)
  Progress Note    Robin [name]   VEL:[id number]  DOB: 05/17/1935  DOA: 03/15/2021     10 Date of Service: 03/25/2021    Subjective:  Reports multiple episodes of diarrhea.  No nausea no vomiting but no fever no chills.  Hospital Problems * SBO (small bowel obstruction) (HCC) History of abdominal surgery in May 2022. CT showing concern for possible obstruction involving internal hernia General surgery following, appreciate assistance patient failed to improve with supportive care and taken to OR on 9/30; underwent successful LOA's Currently on dysphagia 1 diet. Reports frequent diarrhea.  Will discuss with surgery regarding initiation of FiberCon.   Acute ischemic left MCA stroke ([place]) Code Stroke  CT head No acute finding ASPECTS 10.    MRI  Acute infarction affecting a 3 cm region of the cortical and subcortical brain in the left parietal area.  IR: Occluded angular branch of LT MCA inf division  in the M3/M4 region with robust collaterals. Moderate to severe  FMD changes of  the  mid third Lt ICA  associated with 2 pseudoaneurysms. Repeat head ct: 03/24/2021 shows continued evolution of early subacute left posterior MCA branch infarct.  Stable in size.  No hemorrhage. 2D Echo left ventricular ejection fraction 60 to 65%.  No wall motion abnormalities.  LDL 80 HgbA1c 5.3 Eliquis (apixaban) daily prior to admission, On eliquis 5mg  BID. Therapy recommendations:  SNF  Hypertension Blood pressure stable. See afib  Paroxysmal atrial fibrillation (HCC) - Eliquis was on hold; now resumed  - rate controlled.  chronic Diarrhea Will discuss with General surgery regarding resumption of FiberCon.  Cecal cancer & polyps s/p lap hemicolectomy 10/31/2020 - See obstruction.  At risk for development of adhesions (confirmed during ex-lap)     Objective Vital signs were reviewed and unremarkable.  Vitals:   03/25/21 0000 03/25/21 0042 03/25/21 0749 03/25/21 1943  BP: (!) 148/71  (!) 144/74 (!) 146/83 (!) 153/84  Pulse: 92 92 95 83  Resp: (!) 22 17 17 17   Temp:  (!) 97.5 F (36.4 C) (!) 97.5 F (36.4 C) 97.8 F (36.6 C)  TempSrc:  Oral Oral   SpO2: 96% 97% 99% 99%  Height:          Exam General: Appear in mild distress, no Rash; Oral Mucosa Clear, moist. no Abnormal Neck Mass Or lumps, Conjunctiva normal  Cardiovascular: S1 and S2 Present, no Murmur, Respiratory: good respiratory effort, Bilateral Air entry present and CTA, no Crackles, no wheezes Abdomen: Bowel Sound present, Soft and no tenderness Extremities: no Pedal edema Neurology: alert and oriented to time, place, and person affect appropriate. no new focal deficit Gait not checked due to patient safety concerns    Labs / Other Information My review of labs, imaging, notes and other tests shows no new significant findings.     Time spent: 35 mins  [facility] 03/25/2021, 8:29 PM

## 2021-03-25 NOTE — Assessment & Plan Note (Addendum)
On Fibercon and lomotil at home. Presented with SBO needing Surgery for LOA.  Will stop lomotil. Resume fibercon

## 2021-03-25 NOTE — Progress Notes (Signed)
Occupational Therapy Treatment Patient Details Name: Robin [name] MRN: [id number] DOB: 1935-06-05 Today's Date: 03/25/2021   History of present illness patient is a 86 yo female admitted with SBO s/p ex lap with lysis of adhesions 9/30. Hx of Afib. fell on 10/5 and code stroke called, found to have left MCA parietal small cortical and subcortical infarct. Right posterior parietal WM infarct. MRA showed left MCA M1 high grade stenosis with M2 superior division occlusion. Right M2 possible occlusion. Pt was sent to [facility] for cerebral angiogram performed 10/5.   OT comments  Pt reports she is using her Rt UE better today and was able to feed herself with her Rt hand.  Her Rt shoulder is still limited by pain - able to reproduce  the pain with palpation over the bicep tendon.  Pt instructed in initial HEP for Rt shoulder with focus on scapula adduction to improve alignment of shoulder.  If she "sets" her scapula to improve alignment of her shoulder, she was able to achieve ~100* shoulder flexion without pain.  Will continue to follow.    Recommendations for follow up therapy are one component of a multi-disciplinary discharge planning process, led by the attending physician.  Recommendations may be updated based on patient status, additional functional criteria and insurance authorization.    Follow Up Recommendations  SNF    Equipment Recommendations  None recommended by OT    Recommendations for Other Services      Precautions / Restrictions Precautions Precautions: Fall Precaution Comments: monitor HR       Mobility Bed Mobility                    Transfers                      Balance                                           ADL either performed or assessed with clinical judgement   ADL Overall ADL's : Needs assistance/impaired Eating/Feeding: Modified independent                                           Vision        Perception     Praxis      Cognition Arousal/Alertness: Awake/alert Behavior During Therapy: WFL for tasks assessed/performed Overall Cognitive Status: Within Functional Limits for tasks assessed                                          Exercises Exercises: Other exercises Other Exercises Other Exercises: Pt instructed in scapular adduction exercises and instructed to perform 10-15 reps/hour and was able to return demonstration Other Exercises: If pt "sets" her scapula on her rib cage, she was able to achieve ~100* of Active assisted shoulder flexion without c/o pain Other Exercises: encouraged elevation of Rt UE due to edema.  Rt UE elevated on two pillows   Shoulder Instructions       General Comments      Pertinent Vitals/ Pain       Pain Assessment: Faces Faces Pain Scale: Hurts little more Pain Location:  Rt shoulder, over biceps tendon, with shoulder elevation Pain Descriptors / Indicators: Grimacing;Guarding Pain Intervention(s): Limited activity within patient's tolerance;Repositioned  Home Living                                          Prior Functioning/Environment              Frequency  Min 2X/week        Progress Toward Goals  OT Goals(current goals can now be found in the care plan section)  Progress towards OT goals: Progressing toward goals (add goal)  Acute Rehab OT Goals Patient Stated Goal: to return to independent OT Goal Formulation: With patient Time For Goal Achievement: 04/07/21 Potential to Achieve Goals: Good ADL Goals Additional ADL Goal #2: Pt will be independent with HEP for Rt UE  Plan      Co-evaluation                 AM-PAC OT "6 Clicks" Daily Activity     Outcome Measure   Help from another person eating meals?: None Help from another person taking care of personal grooming?: A Little Help from another person toileting, which includes using toliet, bedpan, or urinal?:  A Little Help from another person bathing (including washing, rinsing, drying)?: A Little Help from another person to put on and taking off regular upper body clothing?: A Little Help from another person to put on and taking off regular lower body clothing?: A Little 6 Click Score: 19    End of Session    OT Visit Diagnosis: Unsteadiness on feet (R26.81);Muscle weakness (generalized) (M62.81);Dizziness and giddiness (R42);Pain Pain - Right/Left: Right Pain - part of body: Shoulder   Activity Tolerance Patient tolerated treatment well   Patient Left in bed;with call bell/phone within reach;with bed alarm set   Nurse Communication Mobility status;Precautions        Time: 9574-7340 OT Time Calculation (min): 16 min  Charges: OT General Charges $OT Visit: 1 Visit OT Treatments $Therapeutic Exercise: 8-22 mins  Nilsa Nutting., OTR/L Acute Rehabilitation Services Pager [phone] Office [phone]   Lucille Passy M 03/25/2021, 2:18 PM

## 2021-03-26 LAB — CBC
HCT: 34 % — ABNORMAL LOW (ref 36.0–46.0)
Hemoglobin: 11.2 g/dL — ABNORMAL LOW (ref 12.0–15.0)
MCH: 29 pg (ref 26.0–34.0)
MCHC: 32.9 g/dL (ref 30.0–36.0)
MCV: 88.1 fL (ref 80.0–100.0)
Platelets: 271 10*3/uL (ref 150–400)
RBC: 3.86 MIL/uL — ABNORMAL LOW (ref 3.87–5.11)
RDW: 13.7 % (ref 11.5–15.5)
WBC: 6.7 10*3/uL (ref 4.0–10.5)
nRBC: 0 % (ref 0.0–0.2)

## 2021-03-26 LAB — BASIC METABOLIC PANEL
Anion gap: 8 (ref 5–15)
BUN: 13 mg/dL (ref 8–23)
CO2: 25 mmol/L (ref 22–32)
Calcium: 8.9 mg/dL (ref 8.9–10.3)
Chloride: 102 mmol/L (ref 98–111)
Creatinine, Ser: 0.47 mg/dL (ref 0.44–1.00)
GFR, Estimated: >60 mL/min (ref >60)
Glucose, Bld: 104 mg/dL — ABNORMAL HIGH (ref 70–99)
Potassium: 3.3 mmol/L — ABNORMAL LOW (ref 3.5–5.1)
Sodium: 135 mmol/L (ref 135–145)

## 2021-03-26 MED ORDER — APIXABAN 2.5 MG PO TABS
2.5000 mg | ORAL_TABLET | Freq: Two times a day (BID) | ORAL | Status: DC
  Administered 2021-03-26 – 2021-03-28 (×5): 2.5 mg via ORAL
  Filled 2021-03-26 (×6): qty 1

## 2021-03-26 NOTE — Assessment & Plan Note (Signed)
Stage I medial, present on admission.  Continue foam dressing.

## 2021-03-26 NOTE — Progress Notes (Signed)
  Progress Note    Robin [name]   [id number]:[id number]  DOB: May 27, 1935  DOA: 03/15/2021     11 Date of Service: 03/26/2021     Subjective:  Diarrhea improving.  No abdominal pain.  Hospital Problems * SBO (small bowel obstruction) (HCC) History of abdominal surgery in May 2022. CT showing concern for possible obstruction involving internal hernia General surgery following, appreciate assistance patient failed to improve with supportive care and taken to OR on 9/30; underwent successful LOA's Currently on dysphagia 1 diet.  Advance to soft low fiber diet.   Acute ischemic left MCA stroke ([place]) Code Stroke  CT head No acute finding ASPECTS 10.    MRI  Acute infarction affecting a 3 cm region of the cortical and subcortical brain in the left parietal area.  IR: Occluded angular branch of LT MCA inf division  in the M3/M4 region with robust collaterals. Moderate to severe  FMD changes of  the  mid third Lt ICA  associated with 2 pseudoaneurysms. Repeat head ct: 03/24/2021 shows continued evolution of early subacute left posterior MCA branch infarct.  Stable in size.  No hemorrhage. 2D Echo left ventricular ejection fraction 60 to 65%.  No wall motion abnormalities.  LDL 80 HgbA1c 5.3 Eliquis (apixaban) daily prior to admission, On eliquis 5mg  BID. Therapy recommendations:  SNF  Hypertension Blood pressure stable. See afib  Paroxysmal atrial fibrillation (HCC) Eliquis was on hold. Now resume. On Lopressor and Cardizem.  Currently rate controlled without any medication.  chronic Diarrhea Will discuss with neurosurgery regarding resumption of FiberCon.  Pressure injury of skin Stage I medial, present on admission.  Continue foam dressing.  Cecal cancer & polyps s/p lap hemicolectomy 10/31/2020 See obstruction.  At risk for development of adhesions (confirmed during ex-lap)     Objective Vital signs were reviewed and unremarkable.  Vitals:   03/25/21 1943 03/26/21 0412  03/26/21 0800 03/26/21 1837  BP: (!) 153/84 (!) 152/80 127/65 139/78  Pulse: 83 92 92 89  Resp: 17 17 16 18   Temp: 97.8 F (36.6 C) 97.8 F (36.6 C) 97.6 F (36.4 C) 98.6 F (37 C)  TempSrc:  Oral Oral Oral  SpO2: 99% 100% 99% 99%  Height:          Exam  General: Appear in mild distress, no Rash; Oral Mucosa Clear, moist. no Abnormal Neck Mass Or lumps, Conjunctiva normal  Cardiovascular: S1 and S2 Present, no Murmur, Respiratory: good respiratory effort, Bilateral Air entry present and CTA, no Crackles, no wheezes Abdomen: Bowel Sound present, Soft and no tenderness Extremities: no Pedal edema Neurology: alert and oriented to time, place, and person affect appropriate. no new focal deficit Gait not checked due to patient safety concerns   Labs / Other Information My review of labs, imaging, notes and other tests shows no new significant findings.     Time spent: 35 mins [facility] 03/26/2021, 6:44 PM

## 2021-03-26 NOTE — Plan of Care (Signed)
  Problem: Pain Managment: Goal: General experience of comfort will improve Outcome: Progressing   Problem: Nutrition: Goal: Adequate nutrition will be maintained Outcome: Progressing   

## 2021-03-26 NOTE — Progress Notes (Addendum)
STROKE TEAM PROGRESS NOTE    Doing well from stroke standpoint. She may go home tomorrow Vitals:   03/25/21 0749 03/25/21 1943 03/26/21 0412 03/26/21 0800  BP: (!) 146/83 (!) 153/84 (!) 152/80 127/65  Pulse: 95 83 92 92  Resp: 17 17 17 16   Temp: (!) 97.5 F (36.4 C) 97.8 F (36.6 C) 97.8 F (36.6 C) 97.6 F (36.4 C)  TempSrc: Oral  Oral Oral  SpO2: 99% 99% 100% 99%  Height:       CBC:  Recent Labs  Lab 03/22/21 0505 03/23/21 0317 03/24/21 0256 03/25/21 0047 03/26/21 0134  WBC 9.1 7.7   < > 8.4 6.7  NEUTROABS 6.5 7.5  --   --   --   HGB 13.4 12.7   < > 12.1 11.2*  HCT 40.3 38.9   < > 37.1 34.0*  MCV 86.9 86.6   < > 87.3 88.1  PLT 225 276   < > 292 271   < > = values in this interval not displayed.   Basic Metabolic Panel:  Recent Labs  Lab 03/22/21 0505 03/23/21 0317 03/24/21 0256 03/25/21 0047 03/26/21 0134  NA 133* 137   < > 135 135  K 3.1* 3.1*   < > 4.0 3.3*  CL 98 104   < > 105 102  CO2 25 22   < > 26 25  GLUCOSE 99 138*   < > 107* 104*  BUN 14 8   < > 14 13  CREATININE 0.53 0.63   < > 0.57 0.47  CALCIUM 9.1 9.1   < > 8.9 8.9  MG 1.7 1.6*  --   --   --   PHOS  --  2.8  --   --   --    < > = values in this interval not displayed.   Lipid Panel:  Recent Labs  Lab 03/23/21 0317  CHOL 141  TRIG 98  HDL 41  CHOLHDL 3.4  VLDL 20  LDLCALC 80   HgbA1c:  Recent Labs  Lab 03/23/21 0317  HGBA1C 5.3    IMAGING past 24 hours No results found.  PHYSICAL EXAM  Neuro:  Mental Status: Alert, oriented, thought content appropriate.  Speech fluent without evidence of aphasia.  follow commands.  Cranial Nerves: II:  Visual fields grossly normal,  III,IV, VI: ptosis not present, extra-ocular motions intact bilaterally pupils equal, round, reactive to light and accommodation V,VII: smile symmetric, facial light touch sensation normal bilaterally VIII: hearing normal bilaterally XII: midline tongue extension Motor: Right Upper extremity   5/5  distally. Not testing proximally due to shoulder pain.  Left: Upper extremity   4+/5 Lower extremity   5/5 Lower extremity   5/5 Tone and bulk:normal tone throughout; no atrophy noted rUE motion limited by shoulder pain.   Sensory:  light touch intact throughout, bilaterally Cerebellar: No ataxia noted.  Gait: deferred     ASSESSMENT/PLAN [name] is a 86 y.o. female with history of a. Fib,  GI bleed ( may 2022), sbo presenting with aphasia and weakness s/p exploratory lap.   Stroke:  left parietal area infarct embolic secondary to a. Fib and no anticoagulation d/t surgery.  Code Stroke  CT head No acute finding ASPECTS 10.    MRI  Acute infarction affecting a 3 cm region of the cortical and subcortical brain in the left parietal area.  IR: Occluded angular branch of LT MCA inf division  in the M3/M4 region  with robust collaterals. Moderate to severe  FMD changes of  the  mid third Lt ICA  associated with 2 pseudoaneurysms. Repeat head ct: 03/24/2021 shows continued evolution of early subacute left posterior MCA branch infarct.  Stable in size.  No hemorrhage. 2D Echo left ventricular ejection fraction 60 to 65%.  No wall motion abnormalities.  LDL 80 HgbA1c 5.3 VTE prophylaxis - SCd's Eliquis (apixaban) daily prior to admission, now on no anticoagulation. Pending repeat CTH, 03/24/21 may restart eliquis. On eliquis 2.5mg  BID.(Reduced dose based on her weight) Therapy recommendations:  SNF Disposition:  pending  Hypertension Home meds:  metoprolol XR Stable Permissive hypertension (OK if < 220/120) but gradually normalize in 5-7 days Long-term BP goal normotensive  Hyperlipidemia Home meds:  none,  LDL 80, goal < 70  Diabetes type II Controlled (no diagnosis) Home meds:  none HgbA1c 5.3, goal < 7.0 CBGs No results for input(s): GLUCAP in the last 72 hours.    Other Stroke Risk Factors Advanced Age >/= 88  Family Hx stroke   Will sign off.  She can follow  up with Stroke Clinic with Dr. Leonie Man after discharge in 4-8 weeks.  Hospital day # 11  Total of 30 mins spent reviewing chart, discussion with patient on prognosis, Dx and plan. Discussed case with patient's nurse. Reviewed Imaging personally.    To contact Stroke Continuity provider, please refer to [email or web address] After hours, contact General Neurology

## 2021-03-26 NOTE — Progress Notes (Signed)
    Assessment & Plan: POD#9 - s/p ex lap with LOA by Dr. Harlow Asa 03/17/2021 for SBO - DYS 1 diet - hopefully able to advance today - continue therapies, anticipating CIR - staples may be removed and steri-strips applied at discharge Stroke - pt doing well from this standpoint, stroke team managing Fall - R shoulder films negative; ecchymosis right wrist RUE thrombophlebitis - warm compresses   FEN - DYS 1 diet VTE - SQ heparin , Eliquis ID - none needed currently   Paroxysmal A. Fib - Eliquis HTN        [name], MD       [facility]       Office: [phone]   Chief Complaint: SBO  Subjective: Patient up in chair, hoping to advance diet today.  Denies abd pain, nausea.  Objective: Vital signs in last 24 hours: Temp:  [97.6 F (36.4 C)-97.8 F (36.6 C)] 97.6 F (36.4 C) (10/09 0800) Pulse Rate:  [83-92] 92 (10/09 0800) Resp:  [16-17] 16 (10/09 0800) BP: (127-153)/(65-84) 127/65 (10/09 0800) SpO2:  [99 %-100 %] 99 % (10/09 0800) Last BM Date: 04/23/21  Intake/Output from previous day: 10/08 0701 - 10/09 0700 In: 1060 [P.O.:1060] Out: -  Intake/Output this shift: No intake/output data recorded.  Physical Exam: HEENT - sclerae clear, mucous membranes moist Neck - soft Abdomen - soft, slightly protuberant; non-tender Ext - no edema, non-tender; ecchymosis right wrist Neuro - alert & oriented  Lab Results:  Recent Labs    03/25/21 0047 03/26/21 0134  WBC 8.4 6.7  HGB 12.1 11.2*  HCT 37.1 34.0*  PLT 292 271   BMET Recent Labs    03/25/21 0047 03/26/21 0134  NA 135 135  K 4.0 3.3*  CL 105 102  CO2 26 25  GLUCOSE 107* 104*  BUN 14 13  CREATININE 0.57 0.47  CALCIUM 8.9 8.9   PT/INR No results for input(s): LABPROT, INR in the last 72 hours. Comprehensive Metabolic Panel:    Component Value Date/Time   NA 135 03/26/2021 0134   NA 135 03/25/2021 0047   NA 139 02/21/2018 0854   NA 137 02/27/2017 1205   K 3.3 (L)  03/26/2021 0134   K 4.0 03/25/2021 0047   CL 102 03/26/2021 0134   CL 105 03/25/2021 0047   CO2 25 03/26/2021 0134   CO2 26 03/25/2021 0047   BUN 13 03/26/2021 0134   BUN 14 03/25/2021 0047   BUN 20 02/21/2018 0854   BUN 12 02/27/2017 1205   CREATININE 0.47 03/26/2021 0134   CREATININE 0.57 03/25/2021 0047   CREATININE 0.54 (L) 11/28/2015 1422   CREATININE 0.71 08/19/2015 0905   GLUCOSE 104 (H) 03/26/2021 0134   GLUCOSE 107 (H) 03/25/2021 0047   CALCIUM 8.9 03/26/2021 0134   CALCIUM 8.9 03/25/2021 0047   AST 24 03/15/2021 1925   AST 20 11/24/2020 0320   ALT 21 03/15/2021 1925   ALT 21 11/24/2020 0320   ALKPHOS 113 03/15/2021 1925   ALKPHOS 175 (H) 11/24/2020 0320   BILITOT 1.9 (H) 03/15/2021 1925   BILITOT 0.9 11/24/2020 0320   PROT 7.7 03/15/2021 1925   PROT 5.6 (L) 11/24/2020 0320   ALBUMIN 4.7 03/15/2021 1925   ALBUMIN 2.6 (L) 11/24/2020 0320    Studies/Results: No results found.    [name] 03/26/2021   Patient ID: Robin [name], female   DOB: [birth year], 86 y.o.   MRN: [id number]

## 2021-03-27 LAB — BASIC METABOLIC PANEL
Anion gap: 6 (ref 5–15)
BUN: 16 mg/dL (ref 8–23)
CO2: 26 mmol/L (ref 22–32)
Calcium: 9 mg/dL (ref 8.9–10.3)
Chloride: 103 mmol/L (ref 98–111)
Creatinine, Ser: 0.51 mg/dL (ref 0.44–1.00)
GFR, Estimated: >60 mL/min (ref >60)
Glucose, Bld: 111 mg/dL — ABNORMAL HIGH (ref 70–99)
Potassium: 3.2 mmol/L — ABNORMAL LOW (ref 3.5–5.1)
Sodium: 135 mmol/L (ref 135–145)

## 2021-03-27 LAB — CBC
HCT: 34.4 % — ABNORMAL LOW (ref 36.0–46.0)
Hemoglobin: 11.3 g/dL — ABNORMAL LOW (ref 12.0–15.0)
MCH: 28.6 pg (ref 26.0–34.0)
MCHC: 32.8 g/dL (ref 30.0–36.0)
MCV: 87.1 fL (ref 80.0–100.0)
Platelets: 263 10*3/uL (ref 150–400)
RBC: 3.95 MIL/uL (ref 3.87–5.11)
RDW: 13.6 % (ref 11.5–15.5)
WBC: 6.2 10*3/uL (ref 4.0–10.5)
nRBC: 0 % (ref 0.0–0.2)

## 2021-03-27 MED ORDER — POTASSIUM CHLORIDE CRYS ER 20 MEQ PO TBCR
40.0000 meq | EXTENDED_RELEASE_TABLET | Freq: Once | ORAL | Status: AC
  Administered 2021-03-27: 40 meq via ORAL
  Filled 2021-03-27: qty 2

## 2021-03-27 NOTE — Progress Notes (Signed)
Patient is s/p diagnostic cerebral angiogram with Dr. Estanislado Pandy on 03/22/21.   Cerebral angiogram showed bilateral FMD changes in the mid third Lt ICA  and the mid third of RT ICA.   Dr. Estanislado Pandy recommends 2-3 week outpatient follow up/consultation visit after discharge.  F/U order placed.   NIR will sign off, please call NIR for questions and concerns.   Armando Gang Rosary Filosa PA-C 03/27/2021 2:59 PM

## 2021-03-27 NOTE — Plan of Care (Signed)
  Problem: Education: Goal: Knowledge of disease or condition will improve Outcome: Progressing Goal: Knowledge of patient specific risk factors addressed and post discharge goals established will improve Outcome: Progressing   

## 2021-03-27 NOTE — Progress Notes (Signed)
Occupational Therapy Treatment Patient Details Name: Robin [name] MRN: [id number] DOB: [birth year] Today's Date: 03/27/2021   History of present illness patient is a 86 yo female admitted with SBO s/p ex lap with lysis of adhesions 9/30. Hx of Afib. fell on 10/5 and code stroke called, found to have left MCA parietal small cortical and subcortical infarct. Right posterior parietal WM infarct. MRA showed left MCA M1 high grade stenosis with M2 superior division occlusion. Right M2 possible occlusion. Pt was sent to [facility] for cerebral angiogram performed 10/5.   OT comments  Pt is slowly progressing toward OT goals. Pt reports increased ability to use RUE on this date. OT encouraged pt to incorporate RUE into functional activity. Pt did well using RUE to assist with grooming and dressing. Pt continues to appropriate for SNF upon d/c. Will follow acutely.   Recommendations for follow up therapy are one component of a multi-disciplinary discharge planning process, led by the attending physician.  Recommendations may be updated based on patient status, additional functional criteria and insurance authorization.    Follow Up Recommendations  SNF    Equipment Recommendations  None recommended by OT    Recommendations for Other Services      Precautions / Restrictions Precautions Precautions: Fall Precaution Comments: monitor HR Restrictions Weight Bearing Restrictions: No       Mobility Bed Mobility               General bed mobility comments: up in chair on arrival    Transfers     Balance Overall balance assessment: Needs assistance Sitting-balance support: Feet supported;Bilateral upper extremity supported Sitting balance-Leahy Scale: Good                                ADL either performed or assessed with clinical judgement   ADL Overall ADL's : Needs assistance/impaired     Grooming: Wash/dry face;Set up;Sitting Grooming Details (indicate cue  type and reason): Using effected RUE             Lower Body Dressing: Min guard;Sitting/lateral leans Lower Body Dressing Details (indicate cue type and reason): To don/doff socks while sitting in chair.               General ADL Comments: OT encouraged pt to incorporate effected RUE into functional activity. Pt did well using RUE to assist with grooming and dressing.     Vision       Perception     Praxis      Cognition Arousal/Alertness: Awake/alert Behavior During Therapy: WFL for tasks assessed/performed Overall Cognitive Status: Within Functional Limits for tasks assessed                                          Exercises Other Exercises: Reiterated scapular shoulder exercises that pt was educated in during previous session. Pt continues to report pain in R shoudler with movement. Pt reported she was having difficulty performing AAROM exercises over weekend due to pain and weakness, but has been doing better today. Pt able to use LUE to assist RUE into approx. 90* of shoulder flexion before pain becomes too intense.   Shoulder Instructions       General Comments HR incr with activity.    Pertinent Vitals/ Pain       Pain Assessment: 0-10  Pain Score: 7  Faces Pain Scale: Hurts little more Pain Location: Rt shoulder, over biceps tendon, with shoulder elevation Pain Descriptors / Indicators: Grimacing;Guarding Pain Intervention(s): Limited activity within patient's tolerance;Repositioned;Monitored during session  Home Living                                          Prior Functioning/Environment              Frequency  Min 2X/week        Progress Toward Goals  OT Goals(current goals can now be found in the care plan section)  Progress towards OT goals: Progressing toward goals  Acute Rehab OT Goals Patient Stated Goal: to return to independent OT Goal Formulation: With patient Time For Goal Achievement:  04/07/21 Potential to Achieve Goals: Good ADL Goals Pt Will Perform Lower Body Dressing: with modified independence;sit to/from stand Pt Will Transfer to Toilet: with min guard assist;bedside commode;ambulating Pt Will Perform Toileting - Clothing Manipulation and hygiene: with min guard assist;sit to/from stand Additional ADL Goal #1: pt will complete sink level standing adl task min guard (A) Additional ADL Goal #2: Pt will be independent with HEP for Rt UE  Plan Discharge plan remains appropriate;Frequency remains appropriate    Co-evaluation                 AM-PAC OT "6 Clicks" Daily Activity     Outcome Measure   Help from another person eating meals?: None Help from another person taking care of personal grooming?: A Little Help from another person toileting, which includes using toliet, bedpan, or urinal?: A Little Help from another person bathing (including washing, rinsing, drying)?: A Little Help from another person to put on and taking off regular upper body clothing?: A Little Help from another person to put on and taking off regular lower body clothing?: A Little 6 Click Score: 19    End of Session    OT Visit Diagnosis: Unsteadiness on feet (R26.81);Muscle weakness (generalized) (M62.81);Dizziness and giddiness (R42);Pain Pain - Right/Left: Right Pain - part of body: Shoulder   Activity Tolerance Patient tolerated treatment well   Patient Left in chair;with call bell/phone within reach   Nurse Communication Mobility status;Precautions        Time: 8590-9311 OT Time Calculation (min): 21 min  Charges: OT General Charges $OT Visit: 1 Visit OT Treatments $Self Care/Home Management : 8-22 mins  Galilee Pierron C, OT/L  Acute Rehab [place] 03/27/2021, 2:34 PM

## 2021-03-27 NOTE — Progress Notes (Signed)
Progress Note  5 Days Post-Op  Subjective: Feeling well this am. Having loose bowel movements but she states this has been going on since her first surgery. She has no more nausea or emesis or abdominal pain and tolerating soft diet well. Ambulating.   Objective: Vital signs in last 24 hours: Temp:  [98 F (36.7 C)-98.6 F (37 C)] 98 F (36.7 C) (10/10 0534) Pulse Rate:  [86-89] 86 (10/10 0534) Resp:  [17-18] 17 (10/10 0534) BP: (139-166)/(63-85) 166/85 (10/10 0534) SpO2:  [99 %-100 %] 100 % (10/10 0534) Last BM Date: 04/23/21  Intake/Output from previous day: 10/09 0701 - 10/10 0700 In: 660 [P.O.:660] Out: -  Intake/Output this shift: No intake/output data recorded.  PE: General: pleasant, WD, female who is laying in bed in NAD HEENT: head is normocephalic, atraumatic.  Mouth is pink and moist Heart: regular, rate, and rhythm.  Palpable radial and pedal pulses bilaterally Lungs: CTAB, no wheezes, rhonchi, or rales noted.  Respiratory effort nonlabored Abd: soft, NT, ND, +BS, midline incision with staples removed. Incision intact without discharge or erythema. Steri strips applied MSK: all 4 extremities are symmetrical with no cyanosis, clubbing, or edema. No calf TTP Skin: warm and dry Psych: A&Ox3 with an appropriate affect.    Lab Results:  Recent Labs    03/26/21 0134 03/27/21 0101  WBC 6.7 6.2  HGB 11.2* 11.3*  HCT 34.0* 34.4*  PLT 271 263   BMET Recent Labs    03/26/21 0134 03/27/21 0101  NA 135 135  K 3.3* 3.2*  CL 102 103  CO2 25 26  GLUCOSE 104* 111*  BUN 13 16  CREATININE 0.47 0.51  CALCIUM 8.9 9.0   PT/INR No results for input(s): LABPROT, INR in the last 72 hours. CMP     Component Value Date/Time   NA 135 03/27/2021 0101   NA 139 02/21/2018 0854   K 3.2 (L) 03/27/2021 0101   CL 103 03/27/2021 0101   CO2 26 03/27/2021 0101   GLUCOSE 111 (H) 03/27/2021 0101   BUN 16 03/27/2021 0101   BUN 20 02/21/2018 0854   CREATININE 0.51  03/27/2021 0101   CREATININE 0.54 (L) 11/28/2015 1422   CALCIUM 9.0 03/27/2021 0101   PROT 7.7 03/15/2021 1925   ALBUMIN 4.7 03/15/2021 1925   AST 24 03/15/2021 1925   ALT 21 03/15/2021 1925   ALKPHOS 113 03/15/2021 1925   BILITOT 1.9 (H) 03/15/2021 1925   GFRNONAA >60 03/27/2021 0101   GFRAA 78 02/21/2018 0854   Lipase     Component Value Date/Time   LIPASE 31 10/26/2020 1919       Studies/Results: No results found.  Anti-infectives: Anti-infectives (From admission, onward)    Start     Dose/Rate Route Frequency Ordered Stop   03/22/21 1650  ceFAZolin (ANCEF) 2-4 GM/100ML-% IVPB       Note to Pharmacy: Domenick Bookbinder   : cabinet override      03/22/21 1650 03/23/21 0459   03/17/21 1200  ertapenem (INVANZ) 1,000 mg in sodium chloride 0.9 % 100 mL IVPB        1 g 200 mL/hr over 30 Minutes Intravenous On call to O.R. 03/17/21 0936 03/17/21 1307        Assessment/Plan  POD#10 - s/p ex lap with LOA by Dr. Harlow Asa 03/17/2021 for SBO - advanced to soft diet yesterday and tolerating well - continue therapies, anticipating CIR - staples removed by me today and steri strips placed Stroke - pt  doing well from this standpoint, stroke team managing Fall - R shoulder films negative; ecchymosis right wrist RUE thrombophlebitis - warm compresses   FEN - soft diet VTE - Eliquis ID - none needed currently   Paroxysmal A. Fib - Eliquis HTN  Disposition: stable for discharge to CIR from general surgery perspective. I will arrange follow up with Dr. Johney Maine and we will sign off. Please do not hesitate to contact us with any further needs     LOS: 12 days    Winferd Humphrey, [facility] 03/27/2021, 8:06 AM Please see Amion for pager number during day hours 7:00am-4:30pm

## 2021-03-27 NOTE — TOC Progression Note (Signed)
Transition of Care [facility]) - Progression Note    Patient Details  Name: Robin [name] MRN: [id number] Date of Birth: August 09, 1934  Transition of Care [facility]) CM/SW Contact  Emeterio Reeve, [place] Phone Number: 03/27/2021, 2:09 PM  Clinical Narrative:     Pt has been accepted for SNF at [facility]. CSW started insurance auth and is waiting for decision.   Expected Discharge Plan: [place] Barriers to Discharge: Continued Medical Work up, Ship broker  Expected Discharge Plan and Services Expected Discharge Plan: [place] Choice: [place] arrangements for the past 2 months: Single Family Home                                       Social Determinants of Health (SDOH) Interventions    Readmission Risk Interventions Readmission Risk Prevention Plan 11/17/2020  Transportation Screening Complete  HRI or [place] Complete  Social Work Consult for [place] Planning/Counseling Complete  Palliative Care Screening Not Applicable  Medication Review [employer]) Complete  Some recent data might be hidden   Emeterio Reeve, [employer] Clinical Social Worker

## 2021-03-27 NOTE — Progress Notes (Signed)
  Progress Note    Robin [name]   GNO:[id number]  DOB: Aug 20, 1934  DOA: 03/15/2021     12 Date of Service: 03/27/2021   Subjective:  3 loose BM today.  No nausea no vomiting.  No abdominal pain.  Hospital Problems * SBO (small bowel obstruction) (HCC) History of abdominal surgery in May 2022. CT showing concern for possible obstruction involving internal hernia General surgery following, appreciate assistance patient failed to improve with supportive care and taken to OR on 9/30; underwent successful LOA's Tolerating soft diet.  Advance to regular diet.  Still has some diarrhea.  Observe overnight.   Acute ischemic left MCA stroke ([place]) Code Stroke  CT head No acute finding ASPECTS 10.    MRI  Acute infarction affecting a 3 cm region of the cortical and subcortical brain in the left parietal area.  IR: Occluded angular branch of LT MCA inf division  in the M3/M4 region with robust collaterals. Moderate to severe  FMD changes of  the  mid third Lt ICA  associated with 2 pseudoaneurysms. Repeat head ct: 03/24/2021 shows continued evolution of early subacute left posterior MCA branch infarct.  Stable in size.  No hemorrhage. 2D Echo left ventricular ejection fraction 60 to 65%.  No wall motion abnormalities.  LDL 80 HgbA1c 5.3 Eliquis (apixaban) daily prior to admission, On eliquis 5mg  BID. Therapy recommendations:  SNF IR recommends 2 to 3-week outpatient follow-up.  Hypertension Blood pressure stable. See afib  Paroxysmal atrial fibrillation (HCC) Eliquis was on hold. Now resume. On Lopressor and Cardizem.  Currently rate controlled without any medication.  chronic Diarrhea Will discuss with neurosurgery regarding resumption of FiberCon.  Pressure injury of skin Stage I medial, present on admission.  Continue foam dressing.  Cecal cancer & polyps s/p lap hemicolectomy 10/31/2020 See obstruction.  At risk for development of adhesions (confirmed during  ex-lap)     Objective Vital signs were reviewed and unremarkable.  Vitals:   03/27/21 1011 03/27/21 1651 03/27/21 1748 03/27/21 1900  BP: (!) 143/90 137/62 137/62 (!) 158/86  Pulse: (!) 101 76 76 82  Resp:  18 18 18   Temp:  97.7 F (36.5 C) 97.7 F (36.5 C) 97.8 F (36.6 C)  TempSrc:  Oral Oral Oral  SpO2:  99%  100%  Weight:   56 kg   Height:   5\' 4"  (1.626 m)    56 kg  Exam General: Appear in mild distress, no Rash; Oral Mucosa Clear, moist. no Abnormal Neck Mass Or lumps, Conjunctiva normal  Cardiovascular: S1 and S2 Present, no Murmur, Respiratory: good respiratory effort, Bilateral Air entry present and CTA, no Crackles, no wheezes Abdomen: Bowel Sound present, Soft and no tenderness Extremities: no Pedal edema Neurology: alert and oriented to time, place, and person affect appropriate. no new focal deficit, right-sided deficit from stroke. Gait not checked due to patient safety concerns    Labs / Other Information Stable hemoglobin.  Stable creatinine.    Time spent: 25 minutes [facility] 03/27/2021, 8:52 PM

## 2021-03-27 NOTE — Progress Notes (Signed)
Physical Therapy Treatment Patient Details Name: Robin [name] MRN: [id number] DOB: 02/15/1935 Today's Date: 03/27/2021   History of Present Illness patient is a 86 yo female admitted with SBO s/p ex lap with lysis of adhesions 9/30. Hx of Afib. fell on 10/5 and code stroke called, found to have left MCA parietal small cortical and subcortical infarct. Right posterior parietal WM infarct. MRA showed left MCA M1 high grade stenosis with M2 superior division occlusion. Right M2 possible occlusion. Pt was sent to [facility] for cerebral angiogram performed 10/5.    PT Comments    Pt admitted with above diagnosis. Pt was able to ambulate in hallway with RW with min guard for safety.  Attempted to walk without RW and pt with impaired balance needing min assist. When pt fatigues with and without RW has more difficulty with balance. Pt also has significant weakness in right UE and cannot raise it without assisting it with her other UE. Feel that she needs SNF prior to d/c home as she has deficits to work on before she goes home alone. Will continue to folow acutely.  Pt currently with functional limitations due to balance and endurance deficits. Pt will benefit from skilled PT to increase their independence and safety with mobility to allow discharge to the venue listed below.      Recommendations for follow up therapy are one component of a multi-disciplinary discharge planning process, led by the attending physician.  Recommendations may be updated based on patient status, additional functional criteria and insurance authorization.  Follow Up Recommendations  SNF     Equipment Recommendations  None recommended by PT    Recommendations for Other Services       Precautions / Restrictions Precautions Precautions: Fall Precaution Comments: monitor HR Restrictions Weight Bearing Restrictions: No     Mobility  Bed Mobility               General bed mobility comments: up in chair on  arrival    Transfers Overall transfer level: Needs assistance Equipment used: Rolling walker (2 wheeled) Transfers: Sit to/from Stand Sit to Stand: Min guard         General transfer comment: Needed cues for hand placement. Min guard for safety.  Ambulation/Gait Ambulation/Gait assistance: Min assist Gait Distance (Feet): 125 Feet Assistive device: Rolling walker (2 wheeled) Gait Pattern/deviations: Step-through pattern;Decreased stride length;Drifts right/left   Gait velocity interpretation: <1.31 ft/sec, indicative of household ambulator General Gait Details: Pt doing fairly well with RW but as she fatigues pt is slightly unsteady even with rW and needs cues to keep it close to her. Pt needs assist for balance when she ambulated withoout the RW as she needs at least HHA and was veering to her right quite a bit.  Pts heart rate 108-125 bpm with activity. SpO2 99% after ambulation on RA   Stairs             Wheelchair Mobility    Modified Rankin (Stroke Patients Only) Modified Rankin (Stroke Patients Only) Pre-Morbid Rankin Score: No significant disability Modified Rankin: Moderately severe disability     Balance Overall balance assessment: Needs assistance Sitting-balance support: Bilateral upper extremity supported;Feet supported Sitting balance-Leahy Scale: Good     Standing balance support: Single extremity supported;During functional activity Standing balance-Leahy Scale: Fair Standing balance comment: reliant on bil UE  Cognition Arousal/Alertness: Awake/alert Behavior During Therapy: WFL for tasks assessed/performed Overall Cognitive Status: Within Functional Limits for tasks assessed                                        Exercises General Exercises - Lower Extremity Ankle Circles/Pumps: AROM;Both;10 reps;Seated Long Arc Quad: AROM;Both;10 reps;Seated Hip ABduction/ADduction: AROM;Both;10  reps;Seated Hip Flexion/Marching: AROM;Both;10 reps;Seated Other Exercises Other Exercises: encouraged elevation of Rt UE due to edema.  Rt UE elevated on two pillows    General Comments General comments (skin integrity, edema, etc.): HR incr with activity.      Pertinent Vitals/Pain Pain Assessment: Faces Faces Pain Scale: Hurts little more Pain Location: Rt shoulder, over biceps tendon, with shoulder elevation Pain Descriptors / Indicators: Grimacing;Guarding Pain Intervention(s): Limited activity within patient's tolerance;Monitored during session;Repositioned    Home Living                      Prior Function            PT Goals (current goals can now be found in the care plan section) Acute Rehab PT Goals Patient Stated Goal: to return to independent Progress towards PT goals: Progressing toward goals    Frequency    Min 4X/week      PT Plan Current plan remains appropriate    Co-evaluation              AM-PAC PT "6 Clicks" Mobility   Outcome Measure  Help needed turning from your back to your side while in a flat bed without using bedrails?: A Little Help needed moving from lying on your back to sitting on the side of a flat bed without using bedrails?: A Little Help needed moving to and from a bed to a chair (including a wheelchair)?: A Little Help needed standing up from a chair using your arms (e.g., wheelchair or bedside chair)?: A Little Help needed to walk in hospital room?: A Little Help needed climbing 3-5 steps with a railing? : A Lot 6 Click Score: 17    End of Session Equipment Utilized During Treatment: Gait belt Activity Tolerance: Patient limited by fatigue Patient left: with call bell/phone within reach;in chair;with chair alarm set Nurse Communication: Mobility status PT Visit Diagnosis: Other abnormalities of gait and mobility (R26.89);Muscle weakness (generalized) (M62.81);History of falling (Z91.81);Pain Pain -  Right/Left: Right Pain - part of body: Arm     Time: 6948-5462 PT Time Calculation (min) (ACUTE ONLY): 24 min  Charges:  $Gait Training: 8-22 mins $Therapeutic Exercise: 8-22 mins                     Robin [name],PT Acute Rehab Services [phone] (pager)    Alvira Philips 03/27/2021, 12:34 PM

## 2021-03-27 NOTE — Progress Notes (Signed)
Speech Language Pathology Treatment: Cognitive-Linquistic  Patient Details Name: Robin [name] MRN: [id number] DOB: [birth year] Today's Date: 03/27/2021 Time: 0915-0930 SLP Time Calculation (min) (ACUTE ONLY): 15 min  Assessment / Plan / Recommendation Clinical Impression  Pt was seen at bedside for skilled ST intervention targeting goals for expressive aphasia. Pt was noted to communicate effectively on a variety of topics with only one instance of word retrieval difficulty. Pt reports she tells her family to "give me a minute - I want to work it out for myself". She agrees that her communication has improved, but is not quite back to normal. She is using strategies to work through the now very minimal word retrieval issues, and has shown significant improvement in communicative effectiveness. Continued ST intervention may not be necessary after DC.   HPI HPI: 86yo female admitted 03/16/21 with n/v and abdominal pain due to SBO. PMH: anxiety, carotid stenosis, HLD, HTN, AFib, GIB, colon cancer s/p hemicolectomy w/ post-op ileus* 10/5 she was found with aphasia and RUE weakness MRI = right posterior parietal infarct. SLE 10/6      SLP Plan  Continue with current plan of care      Recommendations for follow up therapy are one component of a multi-disciplinary discharge planning process, led by the attending physician.  Recommendations may be updated based on patient status, additional functional criteria and insurance authorization.    Recommendations  Medication Administration: Whole meds with liquid Compensations: Small sips/bites;Slow rate                Oral Care Recommendations: Oral care BID Follow up Recommendations: None SLP Visit Diagnosis: Aphasia (R47.01) Plan: Continue with current plan of care       GO               Angela Vazguez B. Quentin Ore, [facility], [place] Speech Language Pathologist Office: [phone]  Shonna Chock  03/27/2021, 9:43 AM

## 2021-03-27 NOTE — Evaluation (Signed)
Clinical/Bedside Swallow Evaluation Patient Details  Name: Robin [name] MRN: [id number] Date of Birth: 25-Jul-1934  Today's Date: 03/27/2021 Time: SLP Start Time (ACUTE ONLY): 8469 SLP Stop Time (ACUTE ONLY): 0915 SLP Time Calculation (min) (ACUTE ONLY): 22 min  Past Medical History:  Past Medical History:  Diagnosis Date   Anxiety    AV bloc first degree    Carotid stenosis 03/02/2017   Hyperlipidemia    Hypertension    Lower extremity edema 12/30/2020   Lumbar pain    fell off of her horse and has low back pain at times   Persistent atrial fibrillation ([place]) 12/30/2020   Venous insufficiency of both lower extremities    Vertigo    Past Surgical History:  Past Surgical History:  Procedure Laterality Date   ABDOMINAL HYSTERECTOMY     BIOPSY  10/28/2020   Procedure: BIOPSY;  Surgeon: Milus Banister, MD;  Location: [facility];  Service: Endoscopy;;   BREAST BIOPSY Left    BREAST EXCISIONAL BIOPSY Right    BREAST LUMPECTOMY WITH RADIOACTIVE SEED LOCALIZATION Left 05/05/2015   Procedure: LEFT BREAST LUMPECTOMY WITH RADIOACTIVE SEED LOCALIZATION;  Surgeon: Autumn Messing III, MD;  Location: [place];  Service: General;  Laterality: Left;   CHOLECYSTECTOMY     COLON RESECTION N/A 10/31/2020   Procedure: LAPAROSCOPIC RIGHT COLON RESECTION, lysis of adhesions, hemicolectomy, bilateral tap block;  Surgeon: [name], MD;  Location: [facility];  Service: General;  Laterality: N/A;   COLONOSCOPY WITH PROPOFOL N/A 10/28/2020   Procedure: COLONOSCOPY WITH PROPOFOL;  Surgeon: Milus Banister, MD;  Location: [facility];  Service: Endoscopy;  Laterality: N/A;   EYE SURGERY     bil cataract surgery   IR ANGIO INTRA EXTRACRAN SEL INTERNAL CAROTID BILAT MOD SED  03/22/2021   IR ANGIO VERTEBRAL SEL VERTEBRAL BILAT MOD SED  03/22/2021   LAPAROTOMY N/A 03/17/2021   Procedure: EXPLORATORY LAPAROTOMY/LYSIS OF ADHESIONS;  Surgeon: [name], MD;  Location: [facility];  Service:  General;  Laterality: N/A;   LUNG SURGERY     POLYPECTOMY  10/28/2020   Procedure: POLYPECTOMY;  Surgeon: Milus Banister, MD;  Location: [facility];  Service: Endoscopy;;   RADIOLOGY WITH ANESTHESIA N/A 03/22/2021   Procedure: IR WITH ANESTHESIA;  Surgeon: Radiologist, Medication, MD;  Location: [place];  Service: Radiology;  Laterality: N/A;   SUBMUCOSAL TATTOO INJECTION  10/28/2020   Procedure: SUBMUCOSAL TATTOO INJECTION;  Surgeon: Milus Banister, MD;  Location: [facility];  Service: Endoscopy;;   HPI:  86yo female admitted 03/16/21 with n/v and abdominal pain due to SBO. PMH: anxiety, carotid stenosis, HLD, HTN, AFib, GIB, colon cancer s/p hemicolectomy w/ post-op ileus* 10/5 she was found with aphasia and RUE weakness MRI = right posterior parietal infarct. SLE 10/6    Assessment / Plan / Recommendation  Clinical Impression  Pt seen at bedside for assessment of swallow function and safety. She was sitting up in recliner finishing her breakfast. Pt presents with adequate natural dentition. CN exam WFL. Volitional cough weak. Voice quality is breathy/slightly harsh, which she attributes to allergies. Pt was observed with thin, puree, and solid textures. No obvious oral issues or delays. No overt s/s aspiration on any texture given. Recommend advancing to regular texture solids when ok with surgery, to allow full range of PO choices. Also recommend consideration of ENT consult if voice quality does not return to normal in a timely fashion. No further ST intervention for dysphagia.  SLP Visit Diagnosis: Dysphagia,  unspecified (R13.10)    Aspiration Risk  Mild aspiration risk    Diet Recommendation Regular;Thin liquid   Liquid Administration via: Cup;Straw Medication Administration: Whole meds with liquid Supervision: Patient able to self feed Compensations: Small sips/bites;Slow rate Postural Changes: Seated upright at 90 degrees    Other  Recommendations Oral Care Recommendations:  Oral care BID    Recommendations for follow up therapy are one component of a multi-disciplinary discharge planning process, led by the attending physician.  Recommendations may be updated based on patient status, additional functional criteria and insurance authorization.  Follow up Recommendations None          Prognosis Prognosis for Safe Diet Advancement: Good      Swallow Study   General Date of Onset: 03/16/21 HPI: 86yo female admitted 03/16/21 with n/v and abdominal pain due to SBO. PMH: anxiety, carotid stenosis, HLD, HTN, AFib, GIB, colon cancer s/p hemicolectomy w/ post-op ileus* 10/5 she was found with aphasia and RUE weakness MRI = right posterior parietal infarct. SLE 10/6 Type of Study: Bedside Swallow Evaluation Previous Swallow Assessment: none Diet Prior to this Study: Dysphagia 3 (soft) Temperature Spikes Noted: No Respiratory Status: Room air History of Recent Intubation: No Behavior/Cognition: Alert;Cooperative;Pleasant mood Oral Cavity Assessment: Within Functional Limits Oral Care Completed by SLP: No Oral Cavity - Dentition: Adequate natural dentition Vision: Functional for self-feeding Self-Feeding Abilities: Able to feed self Patient Positioning: Upright in chair Baseline Vocal Quality: Breathy Volitional Cough: Weak Volitional Swallow: Able to elicit    Oral/Motor/Sensory Function Overall Oral Motor/Sensory Function: Within functional limits   Ice Chips Ice chips: Not tested   Thin Liquid Thin Liquid: Within functional limits Presentation: Cup    Nectar Thick Nectar Thick Liquid: Not tested   Honey Thick Honey Thick Liquid: Not tested   Puree Puree: Within functional limits Presentation: Self Fed   Solid     Solid: Within functional limits Presentation: Shasta B. Quentin Ore, [facility], [place] Speech Language Pathologist Office: [phone]  Shonna Chock 03/27/2021,9:41 AM

## 2021-03-28 DIAGNOSIS — I4811 Longstanding persistent atrial fibrillation: Secondary | ICD-10-CM | POA: Diagnosis not present

## 2021-03-28 DIAGNOSIS — K580 Irritable bowel syndrome with diarrhea: Secondary | ICD-10-CM | POA: Diagnosis not present

## 2021-03-28 DIAGNOSIS — N390 Urinary tract infection, site not specified: Secondary | ICD-10-CM | POA: Diagnosis not present

## 2021-03-28 DIAGNOSIS — K56609 Unspecified intestinal obstruction, unspecified as to partial versus complete obstruction: Secondary | ICD-10-CM | POA: Diagnosis not present

## 2021-03-28 DIAGNOSIS — M79674 Pain in right toe(s): Secondary | ICD-10-CM | POA: Diagnosis not present

## 2021-03-28 DIAGNOSIS — J90 Pleural effusion, not elsewhere classified: Secondary | ICD-10-CM | POA: Diagnosis not present

## 2021-03-28 DIAGNOSIS — E2839 Other primary ovarian failure: Secondary | ICD-10-CM | POA: Diagnosis not present

## 2021-03-28 DIAGNOSIS — E785 Hyperlipidemia, unspecified: Secondary | ICD-10-CM | POA: Diagnosis not present

## 2021-03-28 DIAGNOSIS — R058 Other specified cough: Secondary | ICD-10-CM | POA: Diagnosis not present

## 2021-03-28 DIAGNOSIS — I480 Paroxysmal atrial fibrillation: Secondary | ICD-10-CM | POA: Diagnosis not present

## 2021-03-28 DIAGNOSIS — M545 Low back pain, unspecified: Secondary | ICD-10-CM | POA: Diagnosis not present

## 2021-03-28 DIAGNOSIS — I10 Essential (primary) hypertension: Secondary | ICD-10-CM | POA: Diagnosis not present

## 2021-03-28 DIAGNOSIS — R002 Palpitations: Secondary | ICD-10-CM | POA: Diagnosis not present

## 2021-03-28 DIAGNOSIS — E559 Vitamin D deficiency, unspecified: Secondary | ICD-10-CM | POA: Diagnosis not present

## 2021-03-28 DIAGNOSIS — E790 Hyperuricemia without signs of inflammatory arthritis and tophaceous disease: Secondary | ICD-10-CM | POA: Diagnosis not present

## 2021-03-28 DIAGNOSIS — R911 Solitary pulmonary nodule: Secondary | ICD-10-CM | POA: Diagnosis not present

## 2021-03-28 DIAGNOSIS — N200 Calculus of kidney: Secondary | ICD-10-CM | POA: Diagnosis not present

## 2021-03-28 DIAGNOSIS — K529 Noninfective gastroenteritis and colitis, unspecified: Secondary | ICD-10-CM | POA: Diagnosis not present

## 2021-03-28 DIAGNOSIS — S7011XA Contusion of right thigh, initial encounter: Secondary | ICD-10-CM | POA: Diagnosis not present

## 2021-03-28 DIAGNOSIS — L8990 Pressure ulcer of unspecified site, unspecified stage: Secondary | ICD-10-CM | POA: Diagnosis not present

## 2021-03-28 DIAGNOSIS — I639 Cerebral infarction, unspecified: Secondary | ICD-10-CM | POA: Diagnosis not present

## 2021-03-28 DIAGNOSIS — C189 Malignant neoplasm of colon, unspecified: Secondary | ICD-10-CM | POA: Diagnosis not present

## 2021-03-28 DIAGNOSIS — R7989 Other specified abnormal findings of blood chemistry: Secondary | ICD-10-CM | POA: Diagnosis not present

## 2021-03-28 DIAGNOSIS — D649 Anemia, unspecified: Secondary | ICD-10-CM | POA: Diagnosis not present

## 2021-03-28 DIAGNOSIS — J9811 Atelectasis: Secondary | ICD-10-CM | POA: Diagnosis not present

## 2021-03-28 DIAGNOSIS — R600 Localized edema: Secondary | ICD-10-CM | POA: Diagnosis not present

## 2021-03-28 DIAGNOSIS — R0602 Shortness of breath: Secondary | ICD-10-CM | POA: Diagnosis not present

## 2021-03-28 DIAGNOSIS — R58 Hemorrhage, not elsewhere classified: Secondary | ICD-10-CM | POA: Diagnosis not present

## 2021-03-28 DIAGNOSIS — R109 Unspecified abdominal pain: Secondary | ICD-10-CM | POA: Diagnosis not present

## 2021-03-28 DIAGNOSIS — M109 Gout, unspecified: Secondary | ICD-10-CM | POA: Diagnosis not present

## 2021-03-28 LAB — BASIC METABOLIC PANEL
Anion gap: 4 — ABNORMAL LOW (ref 5–15)
BUN: 18 mg/dL (ref 8–23)
CO2: 27 mmol/L (ref 22–32)
Calcium: 9.3 mg/dL (ref 8.9–10.3)
Chloride: 104 mmol/L (ref 98–111)
Creatinine, Ser: 0.66 mg/dL (ref 0.44–1.00)
GFR, Estimated: >60 mL/min (ref >60)
Glucose, Bld: 111 mg/dL — ABNORMAL HIGH (ref 70–99)
Potassium: 3.3 mmol/L — ABNORMAL LOW (ref 3.5–5.1)
Sodium: 135 mmol/L (ref 135–145)

## 2021-03-28 LAB — RESP PANEL BY RT-PCR (FLU A&B, COVID) ARPGX2
Influenza A by PCR: NEGATIVE
Influenza B by PCR: NEGATIVE
SARS Coronavirus 2 by RT PCR: NEGATIVE

## 2021-03-28 MED ORDER — PANTOPRAZOLE SODIUM 40 MG PO TBEC: 40.0000 mg | DELAYED_RELEASE_TABLET | Freq: Every day | ORAL | 0 refills | Status: AC

## 2021-03-28 MED ORDER — APIXABAN 2.5 MG PO TABS: 2.5000 mg | ORAL_TABLET | Freq: Two times a day (BID) | ORAL | Status: DC

## 2021-03-28 MED ORDER — MENTHOL 3 MG MT LOZG: 1.0000 | LOZENGE | OROMUCOSAL | Status: DC | PRN

## 2021-03-28 MED ORDER — ENSURE ENLIVE PO LIQD: 237.0000 mL | Freq: Two times a day (BID) | ORAL | 12 refills | Status: DC

## 2021-03-28 MED ORDER — MENTHOL 3 MG MT LOZG: 1.0000 | LOZENGE | OROMUCOSAL | 12 refills | Status: DC | PRN

## 2021-03-28 MED ORDER — METOPROLOL SUCCINATE ER 50 MG PO TB24: 50.0000 mg | ORAL_TABLET | Freq: Every day | ORAL | 0 refills | Status: DC

## 2021-03-28 MED ORDER — CALCIUM POLYCARBOPHIL 625 MG PO TABS
625.0000 mg | ORAL_TABLET | Freq: Every day | ORAL | Status: DC
  Administered 2021-03-28: 625 mg via ORAL
  Filled 2021-03-28: qty 1

## 2021-03-28 MED ORDER — CALCIUM POLYCARBOPHIL 625 MG PO TABS: 625.0000 mg | ORAL_TABLET | Freq: Every day | ORAL | 10 refills | Status: AC

## 2021-03-28 MED ORDER — FERROUS SULFATE 325 (65 FE) MG PO TABS: 325.0000 mg | ORAL_TABLET | Freq: Every evening | ORAL | Status: DC

## 2021-03-28 NOTE — Social Work (Signed)
[employer] is requesting a peer to peer to be completed by 11am. The number to call is [phone], option 5. Pts subscriber number is [id number]. MD is aware.  Emeterio Reeve, LCSW Clinical Social Worker

## 2021-03-28 NOTE — TOC Transition Note (Addendum)
Transition of Care [facility]) - CM/SW Discharge Note   Patient Details  Name: Robin [name] MRN: [id number] Date of Birth: [birth year]  [facility] CM/SW Contact:  Emeterio Reeve, LCSW Phone Number: 03/28/2021, 11:31 AM   Clinical Narrative:     Patient will DC to: [name] Anticipated DC date: 03/28/21 Family notified: daughter Transport by: Family car     Per MD patient ready for DC to [name]. RN, patient, patient's family, and facility notified of DC. Discharge Summary and FL2 sent to facility. DC packet on chart. Insurance Josem Kaufmann has been received and pt is covid negative. Ptd daughter [name] will transport by family car.   RN to call report to [phone].  CSW will sign off for now as social work intervention is no longer needed. Please consult [place] again if new needs arise.   Final next level of care: Skilled Nursing Facility Barriers to Discharge: Barriers Resolved   Patient Goals and CMS Choice Patient states their goals for this hospitalization and ongoing recovery are:: to get back home CMS Medicare.gov Compare Post Acute Care list provided to:: Patient Choice offered to / list presented to : Patient  Discharge Placement              Patient chooses bed at: [name] Patient to be transferred to facility by: [facility] Name of family member notified: daughter Patient and family notified of of transfer: 03/28/21  Discharge Plan and Services     Post Acute Care Choice: [name]                               Social Determinants of Health (SDOH) Interventions     Readmission Risk Interventions Readmission Risk Prevention Plan 11/17/2020  Transportation Screening Complete  HRI or [place] Complete  Social Work Consult for [place] Planning/Counseling Complete  Palliative Care Screening Not Applicable  Medication Review Press photographer) Complete  Some recent data might be hidden    Emeterio Reeve,  LCSW Clinical Social Worker

## 2021-03-28 NOTE — Addendum Note (Signed)
Addendum  created 03/28/21 0820 by Belinda Block, MD   Attestation recorded in [place], [place] filed

## 2021-03-28 NOTE — Discharge Summary (Addendum)
Physician Discharge Summary   Patient name: Robin [name]  Admit date:     03/15/2021  Discharge date: 03/28/2021  Attending Physician:   Discharge Physician: Berle Mull   PCP: [name], PA-C    Follow-up Information     [name], MD Follow up.   Specialties: Interventional Radiology, Radiology Why: 2-3 week consultation visit for  bilateral FMD changes with Dr. Estanislado Pandy. Our schedulers will call you to set up the appointment. Contact information: [name] [address] [phone]         [name], MD. Go on 04/19/2021.   Specialties: General Surgery, Colon and Rectal Surgery Why: at 1:30 PM. please arrive by 1:00 PM., For post-operative follow up Contact information: [place] [address] [phone]         [name], [place]. Schedule an appointment as soon as possible for a visit in 1 week(s).   Specialty: Family Medicine Contact information: [place] [address] [phone]                 Recommendations at discharge: follow up as recommended  Discharge Diagnoses Principal Problem:   SBO (small bowel obstruction) ([name]) Active Problems:   Acute ischemic left MCA stroke (HCC)   Paroxysmal atrial fibrillation (HCC)   Hypertension   Cecal cancer & polyps s/p lap hemicolectomy 10/31/2020   Pressure injury of skin   chronic Diarrhea   Resolved Diagnoses Resolved Problems:   * No resolved hospital problems. [facility] Course   Robin [name] is an 86 yo female with PMH persistent afib (on Eliquis), HTN, mild carotid stenosis who presented to the hospital with complaints of abdominal pain. Earlier this year she was treated for a GI bleed in May associated with localized adenocarcinoma of the ascending colon (s/p right colon resection on 10/31/20).  She also developed nausea and vomiting after admission.  An NG tube was placed and she underwent  evaluation with surgery as well as CT abdomen/pelvis. Imaging showed a mid small bowel obstruction, possibly representing a closed-loop obstruction due to internal hernia. She underwent LOA on 03/17/21.    * SBO (small bowel obstruction) ([name]) History of abdominal surgery in May 2022. CT showing concern for possible obstruction involving internal hernia General surgery following, appreciate assistance patient failed to improve with supportive care and taken to OR on 9/30; underwent successful LOA Tolerating regular diet.  Still has some diarrhea. Add Fibercon. Hold lomitol.    Acute ischemic left MCA stroke ([name]) Code Stroke  CT head No acute finding MRI  Acute infarction affecting a 3 cm region of the cortical and subcortical brain in the left parietal area.  IR: Occluded angular branch of LT MCA inf division  in the M3/M4 region with robust collaterals. Moderate to severe  FMD changes of  the mid third Lt ICA  associated with 2 pseudoaneurysms. Repeat head ct: 03/24/2021 shows continued evolution of early subacute left posterior MCA branch infarct.  Stable in size.  No hemorrhage. 2D Echo left ventricular ejection fraction 60 to 65%.  No wall motion abnormalities.  LDL 80 HgbA1c 5.3 Eliquis (apixaban) daily prior to admission, On eliquis 2.5mg  BID due to weight and age.  Therapy recommendations:  SNF IR recommends 2 to 3-week outpatient follow-up.  Hypertension Blood pressure stable. See afib  Paroxysmal atrial fibrillation (HCC) Eliquis was on hold. Now resume at lower dose adjusted for weight.  Was On Lopressor and  Cardizem.  Now on toprol xl Currently rate controlled  chronic Diarrhea On Fibercon and lomotil at home. Presented with SBO needing Surgery for LOA.  Will stop lomotil. Resume fibercon  Pressure injury of skin Stage I medial, present on admission.  Continue foam dressing.  Cecal cancer & polyps s/p lap hemicolectomy 10/31/2020 See obstruction.  At risk for  development of adhesions (confirmed during ex-lap)     Procedures performed: 9/30 exploratory laparotomy, lysis of adhesions   Condition at discharge: good  Exam General: Appear in mild distress, no Rash; Oral Mucosa Clear, moist. no Abnormal Neck Mass Or lumps, Conjunctiva normal  Cardiovascular: S1 and S2 Present, no Murmur, Respiratory: good respiratory effort, Bilateral Air entry present and CTA, no Crackles, no wheezes Abdomen: Bowel Sound present, Soft and no tenderness Extremities: no Pedal edema Neurology: alert and oriented to time, place, and person affect appropriate. no new focal deficit Gait not checked due to patient safety concerns   Disposition: Skilled nursing facility  Discharge time: greater than 30 minutes. Allergies as of 03/28/2021       Reactions   Iodinated Diagnostic Agents Shortness Of Breath   Amlodipine    Shortness of breath   Codeine Nausea Only   Tolerates Dilaudid fine   Diphenhydramine Hcl    Hypertension   Pravastatin    Muscle aches   Zetia [ezetimibe]    Sulfa Antibiotics Rash   Sulfa Drugs Cross Reactors Rash        Medication List     STOP taking these medications    diphenoxylate-atropine 2.5-0.025 MG tablet Commonly known as: LOMOTIL   Tiadylt ER 180 MG 24 hr capsule Generic drug: diltiazem   triamterene-hydrochlorothiazide 37.5-25 MG tablet Commonly known as: MAXZIDE-25       TAKE these medications    acetaminophen 325 MG tablet Commonly known as: TYLENOL Take 2 tablets (650 mg total) by mouth every 6 (six) hours as needed for mild pain, fever or headache.   apixaban 2.5 MG Tabs tablet Commonly known as: ELIQUIS Take 1 tablet (2.5 mg total) by mouth 2 (two) times daily. What changed:  medication strength how much to take   CULTURELLE PO Take 1 capsule by mouth in the morning.   estradiol 1 MG tablet Commonly known as: ESTRACE Take 0.5 mg by mouth daily.   feeding supplement Liqd Take 237 mLs by  mouth 2 (two) times daily between meals.   ferrous sulfate 325 (65 FE) MG tablet Take 1 tablet (325 mg total) by mouth at bedtime.   Lutein 20 MG Caps Take 1 capsule by mouth every evening.   menthol-cetylpyridinium 3 MG lozenge Commonly known as: CEPACOL Take 1 lozenge (3 mg total) by mouth as needed for sore throat.   metoprolol succinate 50 MG 24 hr tablet Commonly known as: TOPROL-XL Take 1 tablet (50 mg total) by mouth daily. Start taking on: March 29, 2021 What changed:  how much to take when to take this additional instructions   mometasone 0.1 % lotion Commonly known as: ELOCON Apply 1 drop topically at bedtime. To forehead   ondansetron 4 MG disintegrating tablet Commonly known as: Zofran ODT Take 1 tablet (4 mg total) by mouth every 8 (eight) hours as needed for nausea or vomiting.   pantoprazole 40 MG tablet Commonly known as: PROTONIX Take 1 tablet (40 mg total) by mouth daily. Start taking on: March 29, 2021   polycarbophil 625 MG tablet Commonly known as: FIBERCON Take 1 tablet (625 mg total)  by mouth daily. What changed: when to take this   potassium chloride SA 20 MEQ tablet Commonly known as: KLOR-CON Take 1 tablet (20 mEq total) by mouth 2 (two) times daily.   triamcinolone cream 0.1 % Commonly known as: KENALOG Apply 1 application topically daily as needed (for rash).   vitamin A 10000 UNIT capsule Take 10,000 Units by mouth daily.   Vitamin D 50 MCG (2000 UT) tablet Take 2,000 Units by mouth at bedtime.               Discharge Care Instructions  (From admission, onward)           Start     Ordered   03/28/21 0000  Leave dressing on - Keep it clean, dry, and intact until clinic visit        03/28/21 1035            CT ABDOMEN PELVIS WO CONTRAST  Result Date: 03/15/2021 CLINICAL DATA:  Intractable nausea and vomiting EXAM: CT ABDOMEN AND PELVIS WITHOUT CONTRAST TECHNIQUE: Multidetector CT imaging of the abdomen and  pelvis was performed following the standard protocol without IV contrast. COMPARISON:  11/23/2020 FINDINGS: Lower chest: The visualized lung bases are clear. The visualized heart and pericardium are unremarkable. Hepatobiliary: No focal liver abnormality is seen. Status post cholecystectomy. No biliary dilatation. Pancreas: Unremarkable Spleen: Unremarkable Adrenals/Urinary Tract: The adrenal glands are unremarkable. The kidneys are normal in size and position. Exophytic 10 cm simple cortical cyst arises from the lower pole the right kidney, unchanged. The kidneys are otherwise unremarkable. The bladder is unremarkable. Stomach/Bowel: Surgical changes of right hemicolectomy are again identified. There are numerous loops of dilated fluid-filled small bowel involving the mid jejunum with decompressed distal colon in keeping with a small-bowel obstruction. Moreover, there are 2 apparent points of transition with bowel pulled across midline into the right upper quadrant, best seen on axial image # 40/2 and 41/2. Moreover, there is focal narrowing of the superior mesenteric venous branches, best seen on image # 42/2 and whorled tethering of the small bowel, best seen on image # 44/2. Altogether, the findings are suggestive of an internal hernia. There is marked mesenteric edema involving the affected segment of mid small bowel suggestive of a closed loop obstruction. Mild ascites is present. No free intraperitoneal gas. No pneumatosis. Previously noted region of peritoneal fat necrosis anterior and superior to the bladder has resolved. Vascular/Lymphatic: Extensive aortoiliac atherosclerotic calcification. No aortic aneurysm. No pathologic adenopathy within the abdomen and pelvis. Reproductive: Status post hysterectomy. No adnexal masses. Other: No abdominal wall hernia.  Rectum unremarkable. Musculoskeletal: No acute bone abnormality. Osseous structures are diffusely osteopenic. Compression deformities of T12, L2, and  L3 are unchanged. No lytic or blastic bone lesion. IMPRESSION: Findings in keeping with a mid small bowel obstruction, possibly representing a closed loop obstruction secondary to internal hernia, as described above. No pneumatosis or free intraperitoneal gas. Extensive mesenteric edema and trace ascites present. Surgical consultation is advised. Resolved area of fat necrosis noted on prior examination. Stable 10 cm simple exophytic cortical cyst arising from the lower pole the right kidney. Aortic Atherosclerosis (ICD10-I70.0). Electronically Signed   By: Fidela Salisbury M.D.   On: 03/15/2021 21:27   DG Shoulder Right  Result Date: 03/23/2021 CLINICAL DATA:  Fall. EXAM: RIGHT SHOULDER - 2+ VIEW COMPARISON:  None. FINDINGS: There is no evidence of fracture or dislocation. There is no evidence of arthropathy or other focal bone abnormality. Soft tissues are unremarkable. IMPRESSION:  Negative. Electronically Signed   By: Marijo Conception M.D.   On: 03/23/2021 13:19   DG Abd 1 View  Result Date: 03/17/2021 CLINICAL DATA:  Small bowel obstruction EXAM: ABDOMEN - 1 VIEW COMPARISON:  03/16/2021 FINDINGS: NG tube tip is in the proximal stomach with the side port in the distal esophagus, unchanged. Dilated lower abdominal small bowel loops are unchanged. No free air or organomegaly. Prior cholecystectomy. IMPRESSION: No significant change. Electronically Signed   By: Rolm Baptise M.D.   On: 03/17/2021 07:12   CT HEAD WO CONTRAST (5MM)  Result Date: 03/24/2021 CLINICAL DATA:  Follow-up examination for stroke. EXAM: CT HEAD WITHOUT CONTRAST TECHNIQUE: Contiguous axial images were obtained from the base of the skull through the vertex without intravenous contrast. COMPARISON:  Prior CT and MRI from 03/22/2021. FINDINGS: Brain: Age-related cerebral atrophy with moderate chronic microvascular ischemic disease. Previously identified acute ischemic infarct involving the posterior cortical and subcortical left parietal  lobe again seen, relatively similar in size and distribution as compared to previous MRI. No associated hemorrhage or mass effect. No other visible acute or evolving large vessel ischemic changes. No mass lesion or midline shift. No hydrocephalus or extra-axial fluid collection. Vascular: No hyperdense vessel. Skull: Scalp soft tissues demonstrate no acute finding. Calvarium intact. Sinuses/Orbits: Globes and orbital soft tissues demonstrate no acute finding. Paranasal sinuses mastoid air cells are clear. Other: None. IMPRESSION: 1. Continued interval evolution in early subacute left posterior MCA distribution infarct, relatively stable in size and appearance as compared to previous MRI. No significant mass effect or evidence for hemorrhagic transformation. 2. No other new acute intracranial abnormality. 3. Age-related cerebral atrophy with moderate chronic microvascular ischemic disease. Electronically Signed   By: Jeannine Boga M.D.   On: 03/24/2021 00:58   MR ANGIO HEAD WO CONTRAST  Result Date: 03/22/2021 CLINICAL DATA:  Neuro deficit, acute, stroke suspected. EXAM: MRI HEAD WITHOUT CONTRAST TECHNIQUE: Multiplanar, multiecho pulse sequences of the brain and surrounding structures were obtained without intravenous contrast. COMPARISON:  Head CT same day FINDINGS: Brain: Diffusion imaging shows an approximally 3 cm region of acute infarction affecting the cortical and subcortical brain of the left parietal lobe. No swelling or hemorrhage visible at this time. There is a focus of what probably represents T2 shine through within the right posterior parietal subcortical white matter. The brainstem appears normal. No focal cerebellar finding. Cerebral hemispheres show moderate chronic small-vessel ischemic changes throughout the deep and subcortical white matter. No mass, hydrocephalus or extra-axial collection. Vascular: Major vessels at the base of the brain show flow. Skull and upper cervical spine:  Negative Sinuses/Orbits: Clear/normal Other: None IMPRESSION: Acute infarction affecting a 3 cm region of the cortical and subcortical brain in the left parietal area. No mass effect or hemorrhage at this time. Probable focus of T2 shine through within the right posterior parietal white matter. Moderate chronic small-vessel ischemic changes of the cerebral hemispheric white matter diffusely. Electronically Signed   By: Nelson Chimes M.D.   On: 03/22/2021 16:18   MR BRAIN WO CONTRAST  Result Date: 03/22/2021 CLINICAL DATA:  Neuro deficit, acute, stroke suspected. EXAM: MRI HEAD WITHOUT CONTRAST TECHNIQUE: Multiplanar, multiecho pulse sequences of the brain and surrounding structures were obtained without intravenous contrast. COMPARISON:  Head CT same day FINDINGS: Brain: Diffusion imaging shows an approximally 3 cm region of acute infarction affecting the cortical and subcortical brain of the left parietal lobe. No swelling or hemorrhage visible at this time. There is a focus of  what probably represents T2 shine through within the right posterior parietal subcortical white matter. The brainstem appears normal. No focal cerebellar finding. Cerebral hemispheres show moderate chronic small-vessel ischemic changes throughout the deep and subcortical white matter. No mass, hydrocephalus or extra-axial collection. Vascular: Major vessels at the base of the brain show flow. Skull and upper cervical spine: Negative Sinuses/Orbits: Clear/normal Other: None IMPRESSION: Acute infarction affecting a 3 cm region of the cortical and subcortical brain in the left parietal area. No mass effect or hemorrhage at this time. Probable focus of T2 shine through within the right posterior parietal white matter. Moderate chronic small-vessel ischemic changes of the cerebral hemispheric white matter diffusely. Electronically Signed   By: Nelson Chimes M.D.   On: 03/22/2021 16:18   DG Abdomen Acute W/Chest  Result Date:  03/15/2021 CLINICAL DATA:  Vomiting EXAM: DG ABDOMEN ACUTE WITH 1 VIEW CHEST COMPARISON:  CT 11/21/2020, radiograph 11/21/2020, chest x-ray 11/13/2020 FINDINGS: Single-view chest demonstrates no focal opacity or pleural effusion. Normal cardiomediastinal silhouette with aortic atherosclerosis. Questionable nodule in the right mid lung. Supine and upright views of the abdomen demonstrate multiple dilated loops of central small bowel up to 5.2 cm with fluid levels on upright exam and relative paucity of distal gas concerning for a bowel obstruction. Surgical clips in the right upper quadrant. Lucency beneath left diaphragm felt to represent air-fluid level in the stomach. IMPRESSION: 1. No radiographic evidence for acute cardiopulmonary abnormality. Possible right midlung nodule which may be correlated with chest CT 2. Findings concerning for small bowel obstruction Electronically Signed   By: Donavan Foil M.D.   On: 03/15/2021 20:33   DG Abd Portable 1 View  Result Date: 03/15/2021 CLINICAL DATA:  Nasogastric tube placement EXAM: PORTABLE ABDOMEN - 1 VIEW COMPARISON:  03/15/2021 FINDINGS: Nasogastric tube is been placed with its proximal side hole within the distal esophagus and its tip just within the proximal stomach. Dilated loops of small bowel are seen within the visualized upper abdomen in keeping with underlying small-bowel obstruction. The visualized lung bases are clear. IMPRESSION: Nasogastric tube tip just within the proximal body of the stomach with the proximal side hole position within the distal esophagus. Advancement of the catheter by 15 cm would more optimally position the catheter Electronically Signed   By: Fidela Salisbury M.D.   On: 03/15/2021 22:45   ECHOCARDIOGRAM COMPLETE  Result Date: 03/23/2021    ECHOCARDIOGRAM REPORT   Patient Name:   SALLY-ANNE WAMBLE Date of Exam: 03/23/2021 Medical Rec #:  [id number]          Height:       64.0 in Accession #:    [id number]         Weight:        130.8 lb Date of Birth:  04-Feb-1935         BSA:          1.633 m Patient Age:    86 years           BP:           148/65 mmHg Patient Gender: F                  HR:           52 bpm. Exam Location:  Inpatient Procedure: 2D Echo, Cardiac Doppler and Color Doppler Indications:    Stroke I63.9  History:        Patient has prior history of Echocardiogram examinations, most  recent 11/15/2020. Arrythmias:Atrial Fibrillation; Risk                 Factors:Hypertension.  Sonographer:    Merrie Roof RDCS Referring Phys: [id number] ASHISH ARORA IMPRESSIONS  1. Left ventricular ejection fraction, by estimation, is 60 to 65%. The left ventricle has normal function. The left ventricle has no regional wall motion abnormalities. Left ventricular diastolic function could not be evaluated.  2. Right ventricular systolic function is normal. The right ventricular size is normal. There is mildly elevated pulmonary artery systolic pressure.  3. Right atrial size was mild to moderately dilated.  4. The mitral valve is grossly normal. Trivial mitral valve regurgitation.  5. The aortic valve is normal in structure. Aortic valve regurgitation is not visualized. FINDINGS  Left Ventricle: Left ventricular ejection fraction, by estimation, is 60 to 65%. The left ventricle has normal function. The left ventricle has no regional wall motion abnormalities. The left ventricular internal cavity size was normal in size. There is  no left ventricular hypertrophy. Left ventricular diastolic function could not be evaluated due to atrial fibrillation. Left ventricular diastolic function could not be evaluated. Right Ventricle: The right ventricular size is normal. Right vetricular wall thickness was not well visualized. Right ventricular systolic function is normal. There is mildly elevated pulmonary artery systolic pressure. The tricuspid regurgitant velocity  is 2.88 m/s, and with an assumed right atrial pressure of 3 mmHg, the estimated  right ventricular systolic pressure is 31.5 mmHg. Left Atrium: Left atrial size was normal in size. Right Atrium: Right atrial size was mild to moderately dilated. Pericardium: There is no evidence of pericardial effusion. Mitral Valve: The mitral valve is grossly normal. Trivial mitral valve regurgitation. Tricuspid Valve: The tricuspid valve is grossly normal. Tricuspid valve regurgitation is mild. Aortic Valve: The aortic valve is normal in structure. Aortic valve regurgitation is not visualized. Aortic valve mean gradient measures 4.0 mmHg. Aortic valve peak gradient measures 7.7 mmHg. Aortic valve area, by VTI measures 1.55 cm. Pulmonic Valve: The pulmonic valve was grossly normal. Pulmonic valve regurgitation is trivial. Aorta: The aortic root and ascending aorta are structurally normal, with no evidence of dilitation. IAS/Shunts: The atrial septum is grossly normal.  LEFT VENTRICLE PLAX 2D LVIDd:         3.40 cm LVIDs:         2.20 cm LV PW:         1.00 cm LV IVS:        1.00 cm LVOT diam:     1.80 cm LV SV:         44 LV SV Index:   27 LVOT Area:     2.54 cm  RIGHT VENTRICLE RV Basal diam:  2.90 cm LEFT ATRIUM           Index        RIGHT ATRIUM           Index LA diam:      3.60 cm 2.20 cm/m   RA Area:     24.70 cm LA Vol (A2C): 49.1 ml 30.06 ml/m  RA Volume:   78.50 ml  48.06 ml/m LA Vol (A4C): 40.6 ml 24.83 ml/m  AORTIC VALVE AV Area (Vmax):    1.80 cm AV Area (Vmean):   1.74 cm AV Area (VTI):     1.55 cm AV Vmax:           139.00 cm/s AV Vmean:  99.300 cm/s AV VTI:            0.286 m AV Peak Grad:      7.7 mmHg AV Mean Grad:      4.0 mmHg LVOT Vmax:         98.25 cm/s LVOT Vmean:        67.850 cm/s LVOT VTI:          0.174 m LVOT/AV VTI ratio: 0.61  AORTA Ao Root diam: 3.10 cm Ao Asc diam:  3.20 cm TRICUSPID VALVE TR Peak grad:   33.2 mmHg TR Vmax:        288.00 cm/s  SHUNTS Systemic VTI:  0.17 m Systemic Diam: 1.80 cm Mertie Moores MD Electronically signed by Mertie Moores MD  Signature Date/Time: 03/23/2021/2:24:18 PM    Final    CT HEAD CODE STROKE WO CONTRAST  Result Date: 03/22/2021 CLINICAL DATA:  Code stroke. Neuro deficit, acute, stroke suspected. Aphasia. EXAM: CT HEAD WITHOUT CONTRAST TECHNIQUE: Contiguous axial images were obtained from the base of the skull through the vertex without intravenous contrast. COMPARISON:  None. FINDINGS: Brain: Age related atrophy. Chronic small-vessel ischemic changes of the white matter, most prominent in the left frontal white matter. No sign of acute infarction, mass lesion, hemorrhage, hydrocephalus or extra-axial collection. Vascular: No abnormal vascular finding. Skull: Normal Sinuses/Orbits: Clear/normal Other: None ASPECTS ([place] Stroke Program Early CT Score) - Ganglionic level infarction (caudate, lentiform nuclei, internal capsule, insula, M1-M3 cortex): 7 - Supraganglionic infarction (M4-M6 cortex): 3 Total score (0-10 with 10 being normal): 10 IMPRESSION: 1. No acute finding. Chronic small-vessel ischemic changes of the white matter, more notable in the left frontal region. 2. ASPECTS is 10 3. These results were communicated to Dr. Rory Percy at 2:48 pm on 03/22/2021 by text page via the [facility] messaging system. Electronically Signed   By: Nelson Chimes M.D.   On: 03/22/2021 14:49   IR ANGIO INTRA EXTRACRAN SEL INTERNAL CAROTID BILAT MOD SED  Result Date: 03/24/2021 INDICATION: Acute onset of aphasia. EXAM: 1. EMERGENT LARGE VESSEL OCCLUSION THROMBOLYSIS (anterior CIRCULATION) COMPARISON:  CT angiogram of the head and neck of March 22, 2021. MEDICATIONS: Ancef 2 g IV antibiotic was administered within 1 hour of the procedure. ANESTHESIA/SEDATION: General anesthesia. CONTRAST:  Omnipaque 350 approximately 80 cc. FLUOROSCOPY TIME:  Fluoroscopy Time: 14 minutes 30 seconds (1215 mGy). COMPLICATIONS: None immediate. TECHNIQUE: Following a full explanation of the procedure along with the potential associated complications, an informed  witnessed consent was obtained. The risks of intracranial hemorrhage of 10%, worsening neurological deficit, ventilator dependency, death and inability to revascularize were all reviewed in detail with the patient's daughter. The patient was then put under general anesthesia by the Department of Anesthesiology at [facility]. The right groin was prepped and draped in the usual sterile fashion. Thereafter using modified Seldinger technique, transfemoral access into the right common femoral artery was obtained without difficulty. Over a 0.035 inch guidewire an 8 [place] Pinnacle 25 cm sheath was inserted. Through this, and also over a 0.035 inch guidewire a 5 [place] JB 1 catheter was advanced to the aortic arch region and selectively positioned in the left common carotid artery, the right vertebral artery, the right common carotid artery, the left subclavian artery and the left vertebral artery. FINDINGS: The left common carotid arteriogram demonstrates the left external carotid artery to be mildly narrowed at its origin secondary to a smooth plaque. Its branches are normally opacified. The left internal carotid artery at the bulb  demonstrates a smooth shallow plaque along the posterior wall without evidence of significant stenosis or of intraluminal filling defects or of ulcerations. Distal to this the proximal 1/3 left ICA appears widely patent. 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 Distal to this the left ICA appears widely patent. The petrous, the cavernous and the supraclinoid segments are widely patent. There is a focal outpouching of approximately 2.9 mm projecting inferiorly and posteriorly in the region of the left posterior communicating artery without evidence of vessel emanating from it. The left middle cerebral artery opacifies into  the capillary and venous phases. Slow flow is noted in the distal M3 M4 region of the angular branch of the inferior division. Numerous collaterals are seen adjacent to this distal branch. The left anterior cerebral artery opacifies into the capillary and venous phases. The right vertebral artery origin is widely patent. The vessel is seen to opacify to the cranial skull base. Patency is seen of the right posterior-inferior cerebellar artery and the right vertebrobasilar junction. The opacified portions of the basilar artery, the posterior cerebral arteries, the superior cerebellar arteries and the anterior-inferior cerebellar arteries demonstrate patency into the capillary and venous phases. Unopacified blood is seen in the basilar artery and to a lesser degree the posterior cerebral arteries from the contralateral vertebral artery. The right common carotid arteriogram demonstrates mild narrowing at the origin of the right external carotid artery. Its branches are widely patent. The right internal carotid artery at the bulb and its proximal 1/3 is widely patent. Smooth segmental focal area of small outpouching is seen in the middle 1/3 without significant intraluminal stenosis. Distal to this the vessel appears widely patent. The petrous, cavernous and supraclinoid segments are widely patent. The right middle cerebral artery and the right anterior cerebral artery opacify into the capillary and venous phases. Approximally 2 mm infundibulum is seen in the right PCOM region with the vessel emanating at its apex. The right middle cerebral artery and the right anterior cerebral artery opacify into the capillary and venous phases. The left vertebral artery origin demonstrates mild narrowing. The vessel is seen to opacify to the cranial skull base. Wide patency is seen of the left vertebrobasilar junction and the left posterior-inferior cerebellar artery. The basilar artery, the posterior cerebral arteries, the superior  cerebellar arteries and the anterior-inferior cerebellar arteries demonstrate wide patency into the capillary and venous phases. The patient was extubated following placement of an 8 French Angio-Seal closure device for hemostasis at the right groin puncture site. Distal pulses were Dopplerable bilaterally. Upon recovery, the patient moved all her four extremities spontaneously and to command. She continued to have expressive difficulties, however. She was then transferred to the neuro ICU for further management. PROCEDURE: As above. 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 Moderate to severe segmental fibromuscular dysplastic changes involving the mid 1/3 the left internal carotid artery, and of a moderate degree involving the mid right internal carotid artery. Focal approximately 2.9 mm outpouching in the left posterior communicating artery region without a vessel emanating from the fundus probably representing a small aneurysm. PLAN: Follow-up as per referring MD. Electronically Signed   By: [name] M.D.   On: 03/24/2021 07:45   IR ANGIO VERTEBRAL SEL VERTEBRAL BILAT MOD SED  Result Date: 03/24/2021 INDICATION: Acute  onset of aphasia. EXAM: 1. EMERGENT LARGE VESSEL OCCLUSION THROMBOLYSIS (anterior CIRCULATION) COMPARISON:  CT angiogram of the head and neck of March 22, 2021. MEDICATIONS: Ancef 2 g IV antibiotic was administered within 1 hour of the procedure. ANESTHESIA/SEDATION: General anesthesia. CONTRAST:  Omnipaque 350 approximately 80 cc. FLUOROSCOPY TIME:  Fluoroscopy Time: 14 minutes 30 seconds (1215 mGy). COMPLICATIONS: None immediate. TECHNIQUE: Following a full explanation of the procedure along with the potential associated complications, an informed witnessed consent was obtained. The risks of intracranial hemorrhage of 10%, worsening neurological deficit, ventilator dependency,  death and inability to revascularize were all reviewed in detail with the patient's daughter. The patient was then put under general anesthesia by the Department of Anesthesiology at [facility]. The right groin was prepped and draped in the usual sterile fashion. Thereafter using modified Seldinger technique, transfemoral access into the right common femoral artery was obtained without difficulty. Over a 0.035 inch guidewire an 8 [place] Pinnacle 25 cm sheath was inserted. Through this, and also over a 0.035 inch guidewire a 5 [place] JB 1 catheter was advanced to the aortic arch region and selectively positioned in the left common carotid artery, the right vertebral artery, the right common carotid artery, the left subclavian artery and the left vertebral artery. FINDINGS: The left common carotid arteriogram demonstrates the left external carotid artery to be mildly narrowed at its origin secondary to a smooth plaque. Its branches are normally opacified. The left internal carotid artery at the bulb demonstrates a smooth shallow plaque along the posterior wall without evidence of significant stenosis or of intraluminal filling defects or of ulcerations. Distal to this the proximal 1/3 left ICA appears widely patent. 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 Distal to this the left ICA appears widely patent. The petrous, the cavernous and the supraclinoid segments are widely patent. There is a focal outpouching of approximately 2.9 mm projecting inferiorly and posteriorly in the region of the left posterior communicating artery without evidence of vessel emanating from it. The left middle cerebral artery opacifies into the capillary and venous phases. Slow flow is noted in the distal M3 M4 region of the angular branch of the inferior division.  Numerous collaterals are seen adjacent to this distal branch. The left anterior cerebral artery opacifies into the capillary and venous phases. The right vertebral artery origin is widely patent. The vessel is seen to opacify to the cranial skull base. Patency is seen of the right posterior-inferior cerebellar artery and the right vertebrobasilar junction. The opacified portions of the basilar artery, the posterior cerebral arteries, the superior cerebellar arteries and the anterior-inferior cerebellar arteries demonstrate patency into the capillary and venous phases. Unopacified blood is seen in the basilar artery and to a lesser degree the posterior cerebral arteries from the contralateral vertebral artery. The right common carotid arteriogram demonstrates mild narrowing at the origin of the right external carotid artery. Its branches are widely patent. The right internal carotid artery at the bulb and its proximal 1/3 is widely patent. Smooth segmental focal area of small outpouching is seen in the middle 1/3 without significant intraluminal stenosis. Distal to this the vessel appears widely patent. The petrous, cavernous and supraclinoid segments are widely patent. The right middle cerebral artery and the right anterior cerebral artery opacify into the capillary and venous phases. Approximally 2 mm infundibulum is seen in the right PCOM region with the  vessel emanating at its apex. The right middle cerebral artery and the right anterior cerebral artery opacify into the capillary and venous phases. The left vertebral artery origin demonstrates mild narrowing. The vessel is seen to opacify to the cranial skull base. Wide patency is seen of the left vertebrobasilar junction and the left posterior-inferior cerebellar artery. The basilar artery, the posterior cerebral arteries, the superior cerebellar arteries and the anterior-inferior cerebellar arteries demonstrate wide patency into the capillary and venous phases.  The patient was extubated following placement of an 8 French Angio-Seal closure device for hemostasis at the right groin puncture site. Distal pulses were Dopplerable bilaterally. Upon recovery, the patient moved all her four extremities spontaneously and to command. She continued to have expressive difficulties, however. She was then transferred to the neuro ICU for further management. PROCEDURE: As above. 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 Moderate to severe segmental fibromuscular dysplastic changes involving the mid 1/3 the left internal carotid artery, and of a moderate degree involving the mid right internal carotid artery. Focal approximately 2.9 mm outpouching in the left posterior communicating artery region without a vessel emanating from the fundus probably representing a small aneurysm. PLAN: Follow-up as per referring MD. Electronically Signed   By: [name] M.D.   On: 03/24/2021 07:45   Results for orders placed or performed during the hospital encounter of 03/15/21  Resp Panel by RT-PCR (Flu A&B, Covid) Nasopharyngeal Swab     Status: None   Collection Time: 03/15/21  7:21 PM   Specimen: Nasopharyngeal Swab; Nasopharyngeal(NP) swabs in vial transport medium  Result Value Ref Range Status   SARS Coronavirus 2 by RT PCR NEGATIVE NEGATIVE Final    Comment: (NOTE) SARS-CoV-2 target nucleic acids are NOT DETECTED.  The SARS-CoV-2 RNA is generally detectable in upper respiratory specimens during the acute phase of infection. The lowest concentration of SARS-CoV-2 viral copies this assay can detect is 138 copies/mL. A negative result does not preclude SARS-Cov-2 infection and should not be used as the sole basis for treatment or other patient management decisions. A negative result may occur with  improper specimen collection/handling, submission of specimen other than  nasopharyngeal swab, presence of viral mutation(s) within the areas targeted by this assay, and inadequate number of viral copies(<138 copies/mL). A negative result must be combined with clinical observations, patient history, and epidemiological information. The expected result is Negative.  Fact Sheet for Patients:  EntrepreneurPulse.com.au  Fact Sheet for Healthcare Providers:  IncredibleEmployment.be  This test is no t yet approved or cleared by the [place] FDA and  has been authorized for detection and/or diagnosis of SARS-CoV-2 by FDA under an Emergency Use Authorization (EUA). This EUA will remain  in effect (meaning this test can be used) for the duration of the COVID-19 declaration under Section 564(b)(1) of the Act, 21 U.S.C.section 360bbb-3(b)(1), unless the authorization is terminated  or revoked sooner.       Influenza A by PCR NEGATIVE NEGATIVE Final   Influenza B by PCR NEGATIVE NEGATIVE Final    Comment: (NOTE) The Xpert Xpress SARS-CoV-2/FLU/RSV plus assay is intended as an aid in the diagnosis of influenza from Nasopharyngeal swab specimens and should not be used as a sole basis for treatment. Nasal washings and aspirates are unacceptable for Xpert Xpress SARS-CoV-2/FLU/RSV testing.  Fact Sheet for Patients: EntrepreneurPulse.com.au  Fact Sheet for Healthcare Providers: IncredibleEmployment.be  This test  is not yet approved or cleared by the [place] and has been authorized for detection and/or diagnosis of SARS-CoV-2 by FDA under an Emergency Use Authorization (EUA). This EUA will remain in effect (meaning this test can be used) for the duration of the COVID-19 declaration under Section 564(b)(1) of the Act, 21 U.S.C. section 360bbb-3(b)(1), unless the authorization is terminated or revoked.  Performed at [facility] [place] [address] [place], [place] [address]   Surgical pcr screen     Status: None   Collection Time: 03/17/21 10:08 AM   Specimen: Nasal Mucosa; Nasal Swab  Result Value Ref Range Status   MRSA, PCR NEGATIVE NEGATIVE Final   Staphylococcus aureus NEGATIVE NEGATIVE Final    Comment: (NOTE) The Xpert SA Assay (FDA approved for NASAL specimens in patients 66 years of age and older), is one component of a comprehensive surveillance program. It is not intended to diagnose infection nor to guide or monitor treatment. Performed at [facility], [place] [address], [place], [place] [address]     Signed:  Berle Mull MD.  [facility] 03/28/2021, 10:47 AM

## 2021-03-28 NOTE — Plan of Care (Signed)
  Problem: Education: Goal: Knowledge of General Education information will improve Description: Including pain rating scale, medication(s)/side effects and non-pharmacologic comfort measures Outcome: Progressing Note: Stroke booklet given instructed about FAST   Problem: Health Behavior/Discharge Planning: Goal: Ability to manage health-related needs will improve Outcome: Progressing

## 2021-03-28 NOTE — Progress Notes (Signed)
Mobility Specialist Progress Note:   03/28/21 0940  Mobility  Activity Ambulated in hall  Level of Assistance Standby assist, set-up cues, supervision of patient - no hands on  Assistive Device Front wheel walker  Distance Ambulated (ft) 176 ft  Mobility Ambulated with assistance in hallway  Mobility Response Tolerated well  Mobility performed by Mobility specialist  Bed Position Chair  $Mobility charge 1 Mobility   Pre- Mobility:  99 HR During Mobility: 108 HR Post Mobility:   92 HR  Pt received in bathroom willing to participate in mobility. No complaints of pain and asx throughout ambulation. Returned to chair with call bell in reach and all needs met.   Robin [name] Mobility Specialist Phone [phone]    

## 2021-03-28 NOTE — Progress Notes (Signed)
RN called and gave report to Osmond at [facility]. She stated understanding, pt does not have an IV and has been notified. AVS on chart.

## 2021-03-28 NOTE — Progress Notes (Signed)
RN called pt's daughter and let her know that her mother was ready for Discharge. Pt's daughter stated she would come and bring her clothes and transport her to [name].

## 2021-03-29 DIAGNOSIS — K56609 Unspecified intestinal obstruction, unspecified as to partial versus complete obstruction: Secondary | ICD-10-CM | POA: Diagnosis not present

## 2021-03-29 DIAGNOSIS — I4811 Longstanding persistent atrial fibrillation: Secondary | ICD-10-CM | POA: Diagnosis not present

## 2021-03-29 DIAGNOSIS — R002 Palpitations: Secondary | ICD-10-CM | POA: Diagnosis not present

## 2021-03-29 DIAGNOSIS — I639 Cerebral infarction, unspecified: Secondary | ICD-10-CM | POA: Diagnosis not present

## 2021-03-31 ENCOUNTER — Other Ambulatory Visit: Payer: Self-pay

## 2021-03-31 NOTE — Patient Outreach (Signed)
[facility]) Care Management  03/31/2021  Robin [name] 23-Nov-1934 [id number]   Received EMMI Stroke. Paitent discharged to [facility]. Spoke with Charmaine at [name]. Patient admitted there currently.   Plan: RN CM will close case and have EMMI calls stopped.    Jone Baseman, RN, MSN [place] Management Care Management Coordinator Direct Line [phone] Cell [phone] Toll Free: [phone]  Fax: [phone]

## 2021-04-03 DIAGNOSIS — S7011XA Contusion of right thigh, initial encounter: Secondary | ICD-10-CM | POA: Diagnosis not present

## 2021-04-03 DIAGNOSIS — I639 Cerebral infarction, unspecified: Secondary | ICD-10-CM | POA: Diagnosis not present

## 2021-04-03 DIAGNOSIS — R109 Unspecified abdominal pain: Secondary | ICD-10-CM | POA: Diagnosis not present

## 2021-04-03 DIAGNOSIS — R600 Localized edema: Secondary | ICD-10-CM | POA: Diagnosis not present

## 2021-04-03 DIAGNOSIS — R0602 Shortness of breath: Secondary | ICD-10-CM | POA: Diagnosis not present

## 2021-04-04 DIAGNOSIS — K529 Noninfective gastroenteritis and colitis, unspecified: Secondary | ICD-10-CM | POA: Diagnosis not present

## 2021-04-04 DIAGNOSIS — I480 Paroxysmal atrial fibrillation: Secondary | ICD-10-CM | POA: Diagnosis not present

## 2021-04-04 DIAGNOSIS — I10 Essential (primary) hypertension: Secondary | ICD-10-CM | POA: Diagnosis not present

## 2021-04-05 ENCOUNTER — Ambulatory Visit: Payer: Medicare PPO | Admitting: Physician Assistant

## 2021-04-05 DIAGNOSIS — J9811 Atelectasis: Secondary | ICD-10-CM | POA: Diagnosis not present

## 2021-04-05 DIAGNOSIS — J90 Pleural effusion, not elsewhere classified: Secondary | ICD-10-CM | POA: Diagnosis not present

## 2021-04-05 DIAGNOSIS — C189 Malignant neoplasm of colon, unspecified: Secondary | ICD-10-CM | POA: Diagnosis not present

## 2021-04-05 DIAGNOSIS — R0602 Shortness of breath: Secondary | ICD-10-CM | POA: Diagnosis not present

## 2021-04-05 DIAGNOSIS — R600 Localized edema: Secondary | ICD-10-CM | POA: Diagnosis not present

## 2021-04-06 ENCOUNTER — Ambulatory Visit: Payer: Medicare PPO

## 2021-04-07 DIAGNOSIS — M545 Low back pain, unspecified: Secondary | ICD-10-CM | POA: Diagnosis not present

## 2021-04-07 DIAGNOSIS — J90 Pleural effusion, not elsewhere classified: Secondary | ICD-10-CM | POA: Diagnosis not present

## 2021-04-07 DIAGNOSIS — C189 Malignant neoplasm of colon, unspecified: Secondary | ICD-10-CM | POA: Diagnosis not present

## 2021-04-07 DIAGNOSIS — R0602 Shortness of breath: Secondary | ICD-10-CM | POA: Diagnosis not present

## 2021-04-07 DIAGNOSIS — R600 Localized edema: Secondary | ICD-10-CM | POA: Diagnosis not present

## 2021-04-10 DIAGNOSIS — C189 Malignant neoplasm of colon, unspecified: Secondary | ICD-10-CM | POA: Diagnosis not present

## 2021-04-10 DIAGNOSIS — R58 Hemorrhage, not elsewhere classified: Secondary | ICD-10-CM | POA: Diagnosis not present

## 2021-04-10 DIAGNOSIS — R600 Localized edema: Secondary | ICD-10-CM | POA: Diagnosis not present

## 2021-04-10 DIAGNOSIS — M79674 Pain in right toe(s): Secondary | ICD-10-CM | POA: Diagnosis not present

## 2021-04-11 DIAGNOSIS — R600 Localized edema: Secondary | ICD-10-CM | POA: Diagnosis not present

## 2021-04-11 DIAGNOSIS — M109 Gout, unspecified: Secondary | ICD-10-CM | POA: Diagnosis not present

## 2021-04-11 DIAGNOSIS — E790 Hyperuricemia without signs of inflammatory arthritis and tophaceous disease: Secondary | ICD-10-CM | POA: Diagnosis not present

## 2021-04-11 DIAGNOSIS — R58 Hemorrhage, not elsewhere classified: Secondary | ICD-10-CM | POA: Diagnosis not present

## 2021-04-12 DIAGNOSIS — J90 Pleural effusion, not elsewhere classified: Secondary | ICD-10-CM | POA: Diagnosis not present

## 2021-04-12 DIAGNOSIS — R0602 Shortness of breath: Secondary | ICD-10-CM | POA: Diagnosis not present

## 2021-04-12 DIAGNOSIS — J9811 Atelectasis: Secondary | ICD-10-CM | POA: Diagnosis not present

## 2021-04-12 DIAGNOSIS — R600 Localized edema: Secondary | ICD-10-CM | POA: Diagnosis not present

## 2021-04-13 DIAGNOSIS — R0602 Shortness of breath: Secondary | ICD-10-CM | POA: Diagnosis not present

## 2021-04-13 DIAGNOSIS — J90 Pleural effusion, not elsewhere classified: Secondary | ICD-10-CM | POA: Diagnosis not present

## 2021-04-13 DIAGNOSIS — R600 Localized edema: Secondary | ICD-10-CM | POA: Diagnosis not present

## 2021-04-13 DIAGNOSIS — M545 Low back pain, unspecified: Secondary | ICD-10-CM | POA: Diagnosis not present

## 2021-04-17 DIAGNOSIS — E876 Hypokalemia: Secondary | ICD-10-CM | POA: Diagnosis not present

## 2021-04-17 DIAGNOSIS — K591 Functional diarrhea: Secondary | ICD-10-CM | POA: Diagnosis not present

## 2021-04-17 DIAGNOSIS — Z8719 Personal history of other diseases of the digestive system: Secondary | ICD-10-CM | POA: Diagnosis not present

## 2021-04-18 DIAGNOSIS — C180 Malignant neoplasm of cecum: Secondary | ICD-10-CM | POA: Diagnosis not present

## 2021-04-18 DIAGNOSIS — J90 Pleural effusion, not elsewhere classified: Secondary | ICD-10-CM | POA: Diagnosis not present

## 2021-04-18 DIAGNOSIS — R600 Localized edema: Secondary | ICD-10-CM | POA: Diagnosis not present

## 2021-04-18 DIAGNOSIS — R0602 Shortness of breath: Secondary | ICD-10-CM | POA: Diagnosis not present

## 2021-04-18 DIAGNOSIS — I480 Paroxysmal atrial fibrillation: Secondary | ICD-10-CM | POA: Diagnosis not present

## 2021-04-18 DIAGNOSIS — I639 Cerebral infarction, unspecified: Secondary | ICD-10-CM | POA: Diagnosis not present

## 2021-04-18 DIAGNOSIS — M545 Low back pain, unspecified: Secondary | ICD-10-CM | POA: Diagnosis not present

## 2021-04-18 DIAGNOSIS — K580 Irritable bowel syndrome with diarrhea: Secondary | ICD-10-CM | POA: Diagnosis not present

## 2021-04-20 DIAGNOSIS — R197 Diarrhea, unspecified: Secondary | ICD-10-CM | POA: Diagnosis not present

## 2021-04-20 DIAGNOSIS — E43 Unspecified severe protein-calorie malnutrition: Secondary | ICD-10-CM | POA: Diagnosis not present

## 2021-04-20 DIAGNOSIS — E876 Hypokalemia: Secondary | ICD-10-CM | POA: Diagnosis not present

## 2021-04-20 DIAGNOSIS — I480 Paroxysmal atrial fibrillation: Secondary | ICD-10-CM | POA: Diagnosis not present

## 2021-04-20 DIAGNOSIS — I63512 Cerebral infarction due to unspecified occlusion or stenosis of left middle cerebral artery: Secondary | ICD-10-CM | POA: Diagnosis not present

## 2021-04-20 DIAGNOSIS — D630 Anemia in neoplastic disease: Secondary | ICD-10-CM | POA: Diagnosis not present

## 2021-04-20 DIAGNOSIS — I5033 Acute on chronic diastolic (congestive) heart failure: Secondary | ICD-10-CM | POA: Diagnosis not present

## 2021-04-20 DIAGNOSIS — C180 Malignant neoplasm of cecum: Secondary | ICD-10-CM | POA: Diagnosis not present

## 2021-04-20 DIAGNOSIS — I69351 Hemiplegia and hemiparesis following cerebral infarction affecting right dominant side: Secondary | ICD-10-CM | POA: Diagnosis not present

## 2021-04-20 DIAGNOSIS — C189 Malignant neoplasm of colon, unspecified: Secondary | ICD-10-CM | POA: Diagnosis not present

## 2021-04-20 DIAGNOSIS — D508 Other iron deficiency anemias: Secondary | ICD-10-CM | POA: Diagnosis not present

## 2021-04-20 DIAGNOSIS — I110 Hypertensive heart disease with heart failure: Secondary | ICD-10-CM | POA: Diagnosis not present

## 2021-04-20 DIAGNOSIS — K589 Irritable bowel syndrome without diarrhea: Secondary | ICD-10-CM | POA: Diagnosis not present

## 2021-04-20 DIAGNOSIS — I739 Peripheral vascular disease, unspecified: Secondary | ICD-10-CM | POA: Diagnosis not present

## 2021-04-28 ENCOUNTER — Ambulatory Visit (HOSPITAL_COMMUNITY): Admission: RE | Admit: 2021-04-28 | Payer: Medicare PPO | Source: Ambulatory Visit

## 2021-05-30 DIAGNOSIS — Z9049 Acquired absence of other specified parts of digestive tract: Secondary | ICD-10-CM | POA: Diagnosis not present

## 2021-05-30 DIAGNOSIS — Z85038 Personal history of other malignant neoplasm of large intestine: Secondary | ICD-10-CM | POA: Diagnosis not present

## 2021-05-30 DIAGNOSIS — R197 Diarrhea, unspecified: Secondary | ICD-10-CM | POA: Diagnosis not present

## 2021-06-13 ENCOUNTER — Other Ambulatory Visit: Payer: Self-pay | Admitting: Cardiovascular Disease

## 2021-06-13 DIAGNOSIS — I4819 Other persistent atrial fibrillation: Secondary | ICD-10-CM

## 2021-06-13 NOTE — Telephone Encounter (Signed)
Eliquis Refill  

## 2021-06-13 NOTE — Telephone Encounter (Signed)
Prescription refill request for Eliquis received. Indication: a fib Last office visit: 12/30/20 Scr: 0.66 Age: 86 Weight:  59kg

## 2021-06-28 DIAGNOSIS — I63512 Cerebral infarction due to unspecified occlusion or stenosis of left middle cerebral artery: Secondary | ICD-10-CM | POA: Diagnosis not present

## 2021-06-28 DIAGNOSIS — I700 Atherosclerosis of aorta: Secondary | ICD-10-CM | POA: Diagnosis not present

## 2021-06-28 DIAGNOSIS — I482 Chronic atrial fibrillation, unspecified: Secondary | ICD-10-CM | POA: Diagnosis not present

## 2021-06-28 DIAGNOSIS — D508 Other iron deficiency anemias: Secondary | ICD-10-CM | POA: Diagnosis not present

## 2021-06-28 DIAGNOSIS — I10 Essential (primary) hypertension: Secondary | ICD-10-CM | POA: Diagnosis not present

## 2021-06-28 DIAGNOSIS — K589 Irritable bowel syndrome without diarrhea: Secondary | ICD-10-CM | POA: Diagnosis not present

## 2021-06-28 DIAGNOSIS — E559 Vitamin D deficiency, unspecified: Secondary | ICD-10-CM | POA: Diagnosis not present

## 2021-06-28 DIAGNOSIS — R899 Unspecified abnormal finding in specimens from other organs, systems and tissues: Secondary | ICD-10-CM | POA: Diagnosis not present

## 2021-06-28 DIAGNOSIS — C189 Malignant neoplasm of colon, unspecified: Secondary | ICD-10-CM | POA: Diagnosis not present

## 2021-06-28 DIAGNOSIS — R197 Diarrhea, unspecified: Secondary | ICD-10-CM | POA: Diagnosis not present

## 2021-07-06 ENCOUNTER — Other Ambulatory Visit: Payer: Self-pay

## 2021-07-06 NOTE — Patient Outreach (Signed)
[facility] [facility]) Care Management  07/06/2021  Robin [name] 1934-11-23 [id number]   Telephone outreach to patient to obtain mRS was successfully completed. MRS= 1   [place] Care Management Assistant

## 2021-07-09 ENCOUNTER — Other Ambulatory Visit: Payer: Self-pay | Admitting: Cardiovascular Disease

## 2021-07-09 DIAGNOSIS — I4819 Other persistent atrial fibrillation: Secondary | ICD-10-CM

## 2021-07-10 ENCOUNTER — Telehealth: Payer: Self-pay | Admitting: Cardiovascular Disease

## 2021-07-10 DIAGNOSIS — I482 Chronic atrial fibrillation, unspecified: Secondary | ICD-10-CM | POA: Diagnosis not present

## 2021-07-10 DIAGNOSIS — U071 COVID-19: Secondary | ICD-10-CM | POA: Diagnosis not present

## 2021-07-10 NOTE — Telephone Encounter (Signed)
Returned call to patient to discuss her prescriptions    Consulted with Laurann Montana, NP who calculated correct dose for the patient to be 2.5mg  twice daily.    Patient also had other medication questions.    Got patient scheduled for an appointment with Laurann Montana, NP on 1/30

## 2021-07-10 NOTE — Telephone Encounter (Signed)
Case discussed with Daphene Jaeger, RN.  Patient reports weight of 130lbs today. She is routinely <60kg. She meets dose reduction criteria with age >87 yo and weight <60kg. She should continue Eliquis at dose of 2.5mg  BID.   Loel Dubonnet, NP

## 2021-07-10 NOTE — Telephone Encounter (Signed)
Eliquis refill please °

## 2021-07-10 NOTE — Telephone Encounter (Signed)
Pt c/o medication issue:  1. Name of Medication: apixaban (ELIQUIS) 2.5 MG TABS tablet  2. How are you currently taking this medication (dosage and times per day)? Take 1 tablet (2.5 mg total) by mouth 2 (two) times daily.  3. Are you having a reaction (difficulty breathing--STAT)? no  4. What is your medication issue? Pt states she should be taking this medication Take 1 tablet (5mg  total) by mouth 2 (two) times daily. Rather than Take 1 tablet (2.5 mg total) by mouth 2 (two) times daily. Please advise

## 2021-07-13 NOTE — Telephone Encounter (Signed)
Prescription refill request for Eliquis received. Indication:Afib Last office visit:7/22 Scr:0.6 Age: 87 Weight:56 kg  Prescription refilled

## 2021-07-17 ENCOUNTER — Other Ambulatory Visit: Payer: Self-pay

## 2021-07-17 ENCOUNTER — Encounter (HOSPITAL_BASED_OUTPATIENT_CLINIC_OR_DEPARTMENT_OTHER): Payer: Self-pay

## 2021-07-17 ENCOUNTER — Ambulatory Visit (HOSPITAL_BASED_OUTPATIENT_CLINIC_OR_DEPARTMENT_OTHER): Payer: Medicare PPO | Admitting: Family

## 2021-07-17 ENCOUNTER — Encounter (HOSPITAL_BASED_OUTPATIENT_CLINIC_OR_DEPARTMENT_OTHER): Payer: Self-pay | Admitting: Family

## 2021-07-17 VITALS — BP 144/76 | HR 76 | Ht 64.0 in | Wt 135.0 lb

## 2021-07-17 DIAGNOSIS — I4819 Other persistent atrial fibrillation: Secondary | ICD-10-CM

## 2021-07-17 DIAGNOSIS — D6859 Other primary thrombophilia: Secondary | ICD-10-CM

## 2021-07-17 DIAGNOSIS — R600 Localized edema: Secondary | ICD-10-CM | POA: Diagnosis not present

## 2021-07-17 DIAGNOSIS — E782 Mixed hyperlipidemia: Secondary | ICD-10-CM

## 2021-07-17 DIAGNOSIS — I119 Hypertensive heart disease without heart failure: Secondary | ICD-10-CM

## 2021-07-17 MED ORDER — METOPROLOL SUCCINATE ER 50 MG PO TB24: 100.0000 mg | ORAL_TABLET | Freq: Every day | ORAL | 3 refills | Status: DC

## 2021-07-17 NOTE — Patient Instructions (Signed)
Medication Instructions:  Continue our current medications.   If your weight is consistently less than 133 lbs then please call us and we will transition to the lower dose of Eliquis.   From a cardiac perspective you do not have to be on the Pantoprazole but there are no significant risks of taking it long term. Recommend discussing with Dr. Alfredo Martinez.  Continue your same blood pressure medications. If your blood pressure is consistently more than 130/80, please call and let us know.   *If you need a refill on your cardiac medications before your next appointment, please call your pharmacy*   Lab Work: None ordered today. Your recent lab work with primary care looked good! Your cholesterol numbers looked fantastic.    Testing/Procedures: None ordered today.    Follow-Up: At [facility], you and your health needs are our priority.  As part of our continuing mission to provide you with exceptional heart care, we have created designated Provider Care Teams.  These Care Teams include your primary Cardiologist (physician) and Advanced Practice Providers (APPs -  Physician Assistants and Nurse Practitioners) who all work together to provide you with the care you need, when you need it.  We recommend signing up for the patient portal called "MyChart".  Sign up information is provided on this After Visit Summary.  MyChart is used to connect with patients for Virtual Visits (Telemedicine).  Patients are able to view lab/test results, encounter notes, upcoming appointments, etc.  Non-urgent messages can be sent to your provider as well.   To learn more about what you can do with MyChart, go to [email or web address].    Your next appointment:   April 17th at 3pm with Dr. Oval Linsey   Other Instructions   Exercises to do While Sitting Exercises that you do while sitting (chair exercises) can give you many of the same benefits as full exercise. Benefits include strengthening your heart, burning  calories, and keeping muscles and joints healthy. Exercise can also improve your mood and help with depression and anxiety. You may benefit from chair exercises if you are unable to do standing exercises due to: Diabetic foot pain. Obesity. Illness. Arthritis. Recovery from surgery or injury. Breathing problems. Balance problems. Another type of disability. Before starting chair exercises, check with your health care provider or a physical therapist to find out how much exercise you can tolerate and which exercises are safe for you. If your health care provider approves: Start out slowly and build up over time. Aim to work up to about 10-20 minutes for each exercise session. Make exercise part of your daily routine. Drink water when you exercise. Do not wait until you are thirsty. Drink every 10-15 minutes. Stop exercising right away if you have pain, nausea, shortness of breath, or dizziness. If you are exercising in a wheelchair, make sure to lock the wheels. Ask your health care provider whether you can do tai chi or yoga. Many positions in these mind-body exercises can be modified to do while seated. Warm-up Before starting other exercises: Sit up as straight as you can. Have your knees bent at 90 degrees, which is the shape of the capital letter "L." Keep your feet flat on the floor. Sit at the front edge of your chair, if you can. Pull in (tighten) the muscles in your abdomen and stretch your spine and neck as straight as you can. Hold this position for a few minutes. Breathe in and out evenly. Try to concentrate on your breathing, and  relax your mind. Stretching Exercise A: Arm stretch Hold your arms out straight in front of your body. Bend your hands at the wrist with your fingers pointing up, as if signaling someone to stop. Notice the slight tension in your forearms as you hold the position. Keeping your arms out and your hands bent, rotate your hands outward as far as you can  and hold this stretch. Aim to have your thumbs pointing up and your pinkie fingers pointing down. Slowly repeat arm stretches for one minute as tolerated. Exercise B: Leg stretch If you can move your legs, try to "draw" letters on the floor with the toes of your foot. Write your name with one foot. Write your name with the toes of your other foot. Slowly repeat the movements for one minute as tolerated. Exercise C: Reach for the sky Reach your hands as far over your head as you can to stretch your spine. Move your hands and arms as if you are climbing a rope. Slowly repeat the movements for one minute as tolerated. Range of motion exercises Exercise A: Shoulder roll Let your arms hang loosely at your sides. Lift just your shoulders up toward your ears, then let them relax back down. When your shoulders feel loose, rotate your shoulders in backward and forward circles. Do shoulder rolls slowly for one minute as tolerated. Exercise B: March in place As if you are marching, pump your arms and lift your legs up and down. Lift your knees as high as you can. If you are unable to lift your knees, just pump your arms and move your ankles and feet up and down. March in place for one minute as tolerated. Exercise C: Seated jumping jacks Let your arms hang down straight. Keeping your arms straight, lift them up over your head. Aim to point your fingers to the ceiling. While you lift your arms, straighten your legs and slide your heels along the floor to your sides, as wide as you can. As you bring your arms back down to your sides, slide your legs back together. If you are unable to use your legs, just move your arms. Slowly repeat seated jumping jacks for one minute as tolerated. Strengthening exercises Exercise A: Shoulder squeeze Hold your arms straight out from your body to your sides, with your elbows bent and your fists pointed at the ceiling. Keeping your arms in the bent position, move  them forward so your elbows and forearms meet in front of your face. Open your arms back out as wide as you can with your elbows still bent, until you feel your shoulder blades squeezing together. Hold for 5 seconds. Slowly repeat the movements forward and backward for one minute as tolerated. Contact a health care provider if: You have to stop exercising due to any of the following: Pain. Nausea. Shortness of breath. Dizziness. Fatigue. You have significant pain or soreness after exercising. Get help right away if: You have chest pain. You have difficulty breathing. These symptoms may represent a serious problem that is an emergency. Do not wait to see if the symptoms will go away. Get medical help right away. Call your local emergency services (911 in the [place] Do not drive yourself to the hospital. Summary Exercises that you do while sitting (chair exercises) can strengthen your heart, burn calories, and keep muscles and joints healthy. You may benefit from chair exercises if you are unable to do standing exercises due to diabetic foot pain, obesity, recovery from surgery  or injury, or other conditions. Before starting chair exercises, check with your health care provider or a physical therapist to find out how much exercise you can tolerate and which exercises are safe for you. This information is not intended to replace advice given to you by your health care provider. Make sure you discuss any questions you have with your health care provider. Document Revised: 07/31/2020 Document Reviewed: 07/31/2020 Elsevier Patient Education  2022 [employer].

## 2021-07-17 NOTE — Progress Notes (Signed)
Office Visit    Patient Name: Robin [name] Date of Encounter: 07/17/2021  PCP:  [name], [place]  Cardiologist:  Skeet Latch, MD  Advanced Practice Provider:  No care team member to display Electrophysiologist:  None   Chief Complaint    Robin [name] is a 87 y.o. female with a hx of hypertension, paroxysmal atrial fibrillation, hyperlipidemia, mild carotid stenosis presents today for medication management  Past Medical History    Past Medical History:  Diagnosis Date   Anxiety    AV bloc first degree    Carotid stenosis 03/02/2017   Hyperlipidemia    Hypertension    Lower extremity edema 12/30/2020   Lumbar pain    fell off of her horse and has low back pain at times   Persistent atrial fibrillation ([place]) 12/30/2020   Venous insufficiency of both lower extremities    Vertigo    Past Surgical History:  Procedure Laterality Date   ABDOMINAL HYSTERECTOMY     BIOPSY  10/28/2020   Procedure: BIOPSY;  Surgeon: Milus Banister, MD;  Location: [facility];  Service: Endoscopy;;   BREAST BIOPSY Left    BREAST EXCISIONAL BIOPSY Right    BREAST LUMPECTOMY WITH RADIOACTIVE SEED LOCALIZATION Left 05/05/2015   Procedure: LEFT BREAST LUMPECTOMY WITH RADIOACTIVE SEED LOCALIZATION;  Surgeon: Autumn Messing III, MD;  Location: [facility];  Service: General;  Laterality: Left;   CHOLECYSTECTOMY     COLON RESECTION N/A 10/31/2020   Procedure: LAPAROSCOPIC RIGHT COLON RESECTION, lysis of adhesions, hemicolectomy, bilateral tap block;  Surgeon: [name], MD;  Location: [facility];  Service: General;  Laterality: N/A;   COLONOSCOPY WITH PROPOFOL N/A 10/28/2020   Procedure: COLONOSCOPY WITH PROPOFOL;  Surgeon: Milus Banister, MD;  Location: [facility];  Service: Endoscopy;  Laterality: N/A;   EYE SURGERY     bil cataract surgery   IR ANGIO INTRA EXTRACRAN SEL INTERNAL CAROTID BILAT MOD SED  03/22/2021   IR ANGIO  VERTEBRAL SEL VERTEBRAL BILAT MOD SED  03/22/2021   LAPAROTOMY N/A 03/17/2021   Procedure: EXPLORATORY LAPAROTOMY/LYSIS OF ADHESIONS;  Surgeon: [name], MD;  Location: [facility];  Service: General;  Laterality: N/A;   LUNG SURGERY     POLYPECTOMY  10/28/2020   Procedure: POLYPECTOMY;  Surgeon: Milus Banister, MD;  Location: [facility];  Service: Endoscopy;;   RADIOLOGY WITH ANESTHESIA N/A 03/22/2021   Procedure: IR WITH ANESTHESIA;  Surgeon: Radiologist, Medication, MD;  Location: [place];  Service: Radiology;  Laterality: N/A;   SUBMUCOSAL TATTOO INJECTION  10/28/2020   Procedure: SUBMUCOSAL TATTOO INJECTION;  Surgeon: Milus Banister, MD;  Location: [facility];  Service: Endoscopy;;    Allergies  Allergies  Allergen Reactions   Iodinated Contrast Media Shortness Of Breath   Amlodipine     Shortness of breath   Codeine Nausea Only    Tolerates Dilaudid fine   Diphenhydramine Hcl     Hypertension    Pravastatin     Muscle aches   Zetia [Ezetimibe]    Sulfa Antibiotics Rash   Sulfa Drugs Cross Reactors Rash    History of Present Illness    Robin [name] is a 87 y.o. female with a hx of hypertension, paroxysmal atrial fibrillation, hyperlipidemia, mild carotid stenosis  last seen 12/30/2020 by Dr. Oval Linsey.  She previously underwent robotic proximal colectomy 10/31/2020 for cecal cancer and open lysis of adhesions for small bowel closed-loop obstruction 03/17/2021.  She underwent laparotomy 05/01/2021.  She contact the office 07/09/2021 with questions regarding her Eliquis dosing.  She was given the impression that her dosing was 5 mg twice daily also had 2.5 mg tablets.  Prior records note 5 mg dosing.  Labs via PCP 06/30/21 Vit D 29.2 Hb 12.4, Hct 37.8 Creatinine 0.8, gfr 73, ast 18, alt 13 Total cholesterol 166, triglycerides 116, ldl 88, hdl 57  She presents today for follow-up.  Shares with me that she had COVID 06/28/2021 still with some residual fatigue but overall  improving.  Blood pressure at home most often less than 130/80 (recent readings 125/80, 115/70). Heart rate routinely 60 to 80 bpm. She has an occasional systolic BP reading in the 140s.   EKGs/Labs/Other Studies Reviewed:   The following studies were reviewed today:  EKG:  No EKG today.   Recent Labs: 11/14/2020: B Natriuretic Peptide 143.9 03/15/2021: ALT 21 03/23/2021: Magnesium 1.6 03/27/2021: Hemoglobin 11.3; Platelets 263 03/28/2021: BUN 18; Creatinine, Ser 0.66; Potassium 3.3; Sodium 135  Recent Lipid Panel    Component Value Date/Time   CHOL 141 03/23/2021 0317   TRIG 98 03/23/2021 0317   HDL 41 03/23/2021 0317   CHOLHDL 3.4 03/23/2021 0317   VLDL 20 03/23/2021 0317   LDLCALC 80 03/23/2021 0317   LDLDIRECT 161.5 06/14/2011 0832   Risk Assessment/Calculations:   CHA2DS2-VASc Score = 4   This indicates a 4.8% annual risk of stroke. The patient's score is based upon: CHF History: 0 HTN History: 1 Diabetes History: 0 Stroke History: 0 Vascular Disease History: 0 Age Score: 2 Gender Score: 1   Home Medications   Current Meds  Medication Sig   acetaminophen (TYLENOL) 325 MG tablet Take 2 tablets (650 mg total) by mouth every 6 (six) hours as needed for mild pain, fever or headache.   Cholecalciferol (VITAMIN D) 50 MCG (2000 UT) tablet Take 2,000 Units by mouth at bedtime.   diltiazem (TIAZAC) 180 MG 24 hr capsule Take 1 capsule by mouth daily.   ELIQUIS 5 MG TABS tablet TAKE 1 TABLET BY MOUTH TWICE DAILY.   estradiol (ESTRACE) 1 MG tablet Take 0.5 mg by mouth daily.   feeding supplement (ENSURE ENLIVE / ENSURE PLUS) LIQD Take 237 mLs by mouth 2 (two) times daily between meals.   ferrous sulfate 325 (65 FE) MG tablet Take 1 tablet (325 mg total) by mouth at bedtime.   hydrochlorothiazide (HYDRODIURIL) 25 MG tablet Take 1 tablet by mouth daily.   Lactobacillus Rhamnosus, GG, (CULTURELLE PO) Take 1 capsule by mouth in the morning.   Lutein 20 MG CAPS Take 1 capsule by  mouth every evening.   metoprolol succinate (TOPROL-XL) 50 MG 24 hr tablet Take 1 tablet (50 mg total) by mouth daily.   mometasone (ELOCON) 0.1 % lotion Apply 1 drop topically at bedtime. To forehead   ondansetron (ZOFRAN ODT) 4 MG disintegrating tablet Take 1 tablet (4 mg total) by mouth every 8 (eight) hours as needed for nausea or vomiting.   pantoprazole (PROTONIX) 40 MG tablet Take 1 tablet (40 mg total) by mouth daily.   polycarbophil (FIBERCON) 625 MG tablet Take 1 tablet (625 mg total) by mouth daily.   potassium chloride SA (KLOR-CON) 20 MEQ tablet Take 1 tablet (20 mEq total) by mouth 2 (two) times daily.   triamcinolone cream (KENALOG) 0.1 % Apply 1 application topically daily as needed (for rash).    vitamin A 10000 UNIT capsule Take 10,000 Units by mouth daily.   [  DISCONTINUED] menthol-cetylpyridinium (CEPACOL) 3 MG lozenge Take 1 lozenge (3 mg total) by mouth as needed for sore throat.     Review of Systems   All other systems reviewed and are otherwise negative except as noted above.  Physical Exam    VS:  BP (!) 144/76 (BP Location: Left Arm, Patient Position: Sitting, Cuff Size: Normal)    Pulse 76    Ht 5\' 4"  (1.626 m)    Wt 135 lb (61.2 kg)    SpO2 99%    BMI 23.17 kg/m  , BMI Body mass index is 23.17 kg/m.  Wt Readings from Last 3 Encounters:  07/17/21 135 lb (61.2 kg)  03/27/21 123 lb 7.3 oz (56 kg)  12/30/20 130 lb 12.8 oz (59.3 kg)    GEN: Well nourished, well developed, in no acute distress. HEENT: normal. Neck: Supple, no JVD, carotid bruits, or masses. Cardiac: irregularly irregular, no murmurs, rubs, or gallops. No clubbing, cyanosis, edema.  Radials/PT 2+ and equal bilaterally.  Respiratory:  Respirations regular and unlabored, clear to auscultation bilaterally. GI: Soft, nontender, nondistended. MS: No deformity or atrophy. Skin: Warm and dry, no rash. Neuro:  Strength and sensation are intact. Psych: Normal affect.  Assessment & Plan     Hypertensive heart disease without heart failure - BP elevated today though routinely at goal <130/80 at home. Continue present antihypertensive regimen.   Hyperlipidemia- Lipid panel 06/30/21 total cholesterol 166, triglycerides 116, HDL 57, LDL 88. Continue atorvastatin. Denies myalgias.   Persistent atrial fibrillation /chronic anticoagulation - Rate controlled today. Continue Diltiazem and Toprol at present doses.  She is on Eliquis 5 mg twice daily.  She previously had a weight less than 133 pounds after her surgery May through September of last year and was on reduced dose but has since consistently been above 133 lbs. As such, continue Eliquis 5mg  BID. She will report weight gain consistently <133 lbs.   Lower extremity edema -stable at baseline.  Continue to elevate lower extremities when sitting.  Disposition: Follow up  April 2023  with Skeet Latch, MD. She prefers to keep her follow up as scheduled.  Signed, Loel Dubonnet, NP 07/17/2021, 3:01 PM [place]

## 2021-07-20 DIAGNOSIS — R197 Diarrhea, unspecified: Secondary | ICD-10-CM | POA: Diagnosis not present

## 2021-07-20 DIAGNOSIS — Z85038 Personal history of other malignant neoplasm of large intestine: Secondary | ICD-10-CM | POA: Diagnosis not present

## 2021-07-20 DIAGNOSIS — Z9049 Acquired absence of other specified parts of digestive tract: Secondary | ICD-10-CM | POA: Diagnosis not present

## 2021-08-07 DIAGNOSIS — K5900 Constipation, unspecified: Secondary | ICD-10-CM | POA: Diagnosis not present

## 2021-08-07 DIAGNOSIS — I4891 Unspecified atrial fibrillation: Secondary | ICD-10-CM | POA: Diagnosis not present

## 2021-08-07 DIAGNOSIS — I10 Essential (primary) hypertension: Secondary | ICD-10-CM | POA: Diagnosis not present

## 2021-08-07 DIAGNOSIS — M8580 Other specified disorders of bone density and structure, unspecified site: Secondary | ICD-10-CM | POA: Diagnosis not present

## 2021-08-07 DIAGNOSIS — M1990 Unspecified osteoarthritis, unspecified site: Secondary | ICD-10-CM | POA: Diagnosis not present

## 2021-08-07 DIAGNOSIS — Z7722 Contact with and (suspected) exposure to environmental tobacco smoke (acute) (chronic): Secondary | ICD-10-CM | POA: Diagnosis not present

## 2021-08-07 DIAGNOSIS — K219 Gastro-esophageal reflux disease without esophagitis: Secondary | ICD-10-CM | POA: Diagnosis not present

## 2021-08-07 DIAGNOSIS — D6869 Other thrombophilia: Secondary | ICD-10-CM | POA: Diagnosis not present

## 2021-08-07 DIAGNOSIS — I69351 Hemiplegia and hemiparesis following cerebral infarction affecting right dominant side: Secondary | ICD-10-CM | POA: Diagnosis not present

## 2021-08-09 ENCOUNTER — Telehealth (HOSPITAL_BASED_OUTPATIENT_CLINIC_OR_DEPARTMENT_OTHER): Payer: Self-pay | Admitting: Cardiovascular Disease

## 2021-08-09 NOTE — Telephone Encounter (Signed)
Spoke with patient regarding dizziness she has been having Did start while she was at [facility] however was thought to be secondary to her stroke She overall is feeling much better except for dizziness which subsides after about an hour, happens after am medications Does not check her blood pressure daily  Discussed with Urban Gibson who suggested medication changes, patient apprehensive about making changes  Blood pressure today 142/78 and 145/? HR 85 Dizziness not related to movement, h/o of vertigo. Yesterday was like her vertigo in past Scheduled appointment with Urban Gibson next week  Will continue current regimen and check blood pressure daily  Bring medications to follow up visit

## 2021-08-09 NOTE — Telephone Encounter (Signed)
Patient wanted to talk to [facility]

## 2021-08-10 ENCOUNTER — Telehealth: Payer: Self-pay | Admitting: Cardiovascular Disease

## 2021-08-10 NOTE — Telephone Encounter (Signed)
Attempted to call patient, no answer [id number]

## 2021-08-10 NOTE — Telephone Encounter (Signed)
Spoke with patient regarding blood pressures today  This morning when she got dizzy she took her blood pressure and was 142/75 She took blood pressure again after she had been getting ready to go to pick up friend, blood pressure 165/96  She was not sure if being up related to her rushing around to get ready or what  Stated she feels fine now and is not going to take blood pressure again tomorrow when she is dizzy in case it is anxiety related Patient will continue to monitor Advised when checking blood pressure to sit and rest for 5-10  prior to checking  Will forward to Overton Mam NP who is seeing patient next week

## 2021-08-10 NOTE — Telephone Encounter (Signed)
Patient called requesting to speak with Dr. Oval Linsey, Laurann Montana, or nurse. Please call back

## 2021-08-10 NOTE — Telephone Encounter (Signed)
Patient returning call.

## 2021-08-10 NOTE — Telephone Encounter (Signed)
Here ya go!  

## 2021-08-11 NOTE — Telephone Encounter (Signed)
Agree with recommendations as provided. Will address at upcoming follow up.   Loel Dubonnet, NP

## 2021-08-14 NOTE — Progress Notes (Signed)
Office Visit    Patient Name: Robin [name] Date of Encounter: 08/15/2021  PCP:  Robin [name], [place]  Cardiologist:  Robin Latch, [name]  Advanced Practice Provider:  No care team member to display Electrophysiologist:  None   Chief Complaint    Robin [name] is a 87 y.o. female with a hx of hypertension, paroxysmal atrial fibrillation, hyperlipidemia, mild carotid stenosis presents today for medication management  Past Medical History    Past Medical History:  Diagnosis Date   Anxiety    AV bloc first degree    Carotid stenosis 03/02/2017   Hyperlipidemia    Hypertension    Lower extremity edema 12/30/2020   Lumbar pain    fell off of her horse and has low back pain at times   Persistent atrial fibrillation ([place]) 12/30/2020   Venous insufficiency of both lower extremities    Vertigo    Past Surgical History:  Procedure Laterality Date   ABDOMINAL HYSTERECTOMY     BIOPSY  10/28/2020   Procedure: BIOPSY;  Surgeon: Robin Banister, [name];  Location: [facility];  Service: Endoscopy;;   BREAST BIOPSY Left    BREAST EXCISIONAL BIOPSY Right    BREAST LUMPECTOMY WITH RADIOACTIVE SEED LOCALIZATION Left 05/05/2015   Procedure: LEFT BREAST LUMPECTOMY WITH RADIOACTIVE SEED LOCALIZATION;  Surgeon: Robin Messing III, [name];  Location: [place];  Service: General;  Laterality: Left;   CHOLECYSTECTOMY     COLON RESECTION N/A 10/31/2020   Procedure: LAPAROSCOPIC RIGHT COLON RESECTION, lysis of adhesions, hemicolectomy, bilateral tap block;  Surgeon: Robin Boston, [name];  [facility]: [facility];  Service: General;  Laterality: N/A;   COLONOSCOPY WITH PROPOFOL N/A 10/28/2020   Procedure: COLONOSCOPY WITH PROPOFOL;  Surgeon: Robin Banister, [name];  [facility]: [facility];  Service: Endoscopy;  Laterality: N/A;   EYE SURGERY     bil cataract surgery   IR ANGIO INTRA EXTRACRAN SEL INTERNAL CAROTID BILAT MOD SED  03/22/2021   IR ANGIO  VERTEBRAL SEL VERTEBRAL BILAT MOD SED  03/22/2021   LAPAROTOMY N/A 03/17/2021   Procedure: EXPLORATORY LAPAROTOMY/LYSIS OF ADHESIONS;  Surgeon: Robin Gemma, [name];  Location: [facility];  Service: General;  Laterality: N/A;   LUNG SURGERY     POLYPECTOMY  10/28/2020   Procedure: POLYPECTOMY;  Surgeon: Robin Banister, [name];  [facility];  Service: Endoscopy;;   RADIOLOGY WITH ANESTHESIA N/A 03/22/2021   Procedure: IR WITH ANESTHESIA;  Surgeon: Robin [name];  Location: [place];  Service: Radiology;  Laterality: N/A;   SUBMUCOSAL TATTOO INJECTION  10/28/2020   Procedure: SUBMUCOSAL TATTOO INJECTION;  Surgeon: Robin Banister, [name];  [facility];  Service: Endoscopy;;    Allergies  Allergies  Allergen Reactions   Iodinated Contrast Media Shortness Of Breath   Amlodipine     Shortness of breath   Codeine Nausea Only    Tolerates Dilaudid fine   Diphenhydramine Hcl     Hypertension    Pravastatin     Muscle aches   Zetia [Ezetimibe]    Sulfa Antibiotics Rash   Sulfa Drugs Cross Reactors Rash    History of Present Illness    Robin [name] is a 87 y.o. female with a hx of hypertension, paroxysmal atrial fibrillation, hyperlipidemia, mild carotid stenosis  last seen 07/17/21.  She previously underwent robotic proximal colectomy 10/31/2020 for cecal cancer and open lysis of adhesions for small bowel closed-loop obstruction 03/17/2021.  She underwent laparotomy  05/01/2021.  She was seen 07/17/21 for medication management. She was continuing to recover from fatigue due to COVID-19 06/28/21. BP at home most often <130/80 and HR 60-80 bpm. As her weight had been above 133 lbs Eliquis 5mg  dosing was continued.    Labs via PCP 06/30/21 Vit D 29.2 Hb 12.4, Hct 37.8 Creatinine 0.8, gfr 73, ast 18, alt 13 Total cholesterol 166, triglycerides 116, ldl 88, hdl 57  She presents today for follow-up.  Shares with me that she had COVID 06/28/2021 still with some residual  fatigue but overall improving.  Blood pressure at home most often less than 130/80 (recent readings 125/80, 115/70). Heart rate routinely 60 to 80 bpm. She has an occasional systolic BP reading in the 140s.   She contacted the office 08/09/21 noting dizziness most often after medication. BP while on phone with RN 142/75. Felt similar to her previous vertigo. She was taking Triamterene-HCTZ though by our records Triamterene previously discontinued. Recommended to follow up in clinic and bring medications with her.   She presents today for follow up independently. During previous admission 03/2021 triamterene-HCTZ was transitioned to HCTZ alone but has since  been returned to triamterene-HCTZ, unclear when. Tells me she changed her potassium to the evening time and this has improved her dizziness. Notes she previously had  difficulty with potassium upsetting her stomach. Notes "dizzy head" most often in the morning after medications which is short lived and improves without intervention. Did feel similar to previous vertigo but Meclizine makes her feel tired, ill. Drinks coffee, water, milk with her cereal prior with her medications. Encouraged to wait 30 minutes after breakfast to take her medications.   Date BP HR  2/23 142/75 87  2/24 130/70 82  2/25 116/67 78  2/26 114/65 73  2/27 111/66 71  2/28 133/76 80   EKGs/Labs/Other Studies Reviewed:   The following studies were reviewed today:  EKG:  No EKG today.   Recent Labs: 11/14/2020: B Natriuretic Peptide 143.9 03/15/2021: ALT 21 03/23/2021: Magnesium 1.6 03/27/2021: Hemoglobin 11.3; Platelets 263 03/28/2021: BUN 18; Creatinine, Ser 0.66; Potassium 3.3; Sodium 135  Recent Lipid Panel    Component Value Date/Time   CHOL 141 03/23/2021 0317   TRIG 98 03/23/2021 0317   HDL 41 03/23/2021 0317   CHOLHDL 3.4 03/23/2021 0317   VLDL 20 03/23/2021 0317   LDLCALC 80 03/23/2021 0317   LDLDIRECT 161.5 06/14/2011 0832   Risk  Assessment/Calculations:   CHA2DS2-VASc Score = 4   This indicates a 4.8% annual risk of stroke. The patient's score is based upon: CHF History: 0 HTN History: 1 Diabetes History: 0 Stroke History: 0 Vascular Disease History: 0 Age Score: 2 Gender Score: 1   Home Medications   Current Meds  Medication Sig   acetaminophen (TYLENOL) 325 MG tablet Take 2 tablets (650 mg total) by mouth every 6 (six) hours as needed for mild pain, fever or headache.   Cholecalciferol (VITAMIN D) 50 MCG (2000 UT) tablet Take 2,000 Units by mouth at bedtime.   diltiazem (TIAZAC) 180 MG 24 hr capsule Take 1 capsule by mouth daily.   diphenoxylate-atropine (LOMOTIL) 2.5-0.025 MG tablet Take 1 tablet by mouth as needed.   ELIQUIS 5 MG TABS tablet TAKE 1 TABLET BY MOUTH TWICE DAILY.   estradiol (ESTRACE) 1 MG tablet Take 0.5 mg by mouth daily.   ferrous sulfate 325 (65 FE) MG tablet Take 1 tablet (325 mg total) by mouth at bedtime.   Lactobacillus Rhamnosus, GG, (  CULTURELLE PO) Take 1 capsule by mouth in the morning.   Lutein 20 MG CAPS Take 1 capsule by mouth every evening.   metoprolol succinate (TOPROL-XL) 50 MG 24 hr tablet Take 2 tablets (100 mg total) by mouth daily.   mometasone (ELOCON) 0.1 % lotion Apply 1 drop topically at bedtime. To forehead   ondansetron (ZOFRAN ODT) 4 MG disintegrating tablet Take 1 tablet (4 mg total) by mouth every 8 (eight) hours as needed for nausea or vomiting.   pantoprazole (PROTONIX) 40 MG tablet Take 1 tablet (40 mg total) by mouth daily.   polycarbophil (FIBERCON) 625 MG tablet Take 1 tablet (625 mg total) by mouth daily.   potassium chloride SA (KLOR-CON) 20 MEQ tablet Take 1 tablet (20 mEq total) by mouth 2 (two) times daily.   triamcinolone cream (KENALOG) 0.1 % Apply 1 application topically daily as needed (for rash).    triamterene-hydrochlorothiazide (MAXZIDE-25) 37.5-25 MG tablet Take 1 tablet by mouth daily.   Vitamins A & D (VITAMIN A & D) 10000-400 units TABS  Take 1 capsule by mouth daily at 12 noon.     Review of Systems   All other systems reviewed and are otherwise negative except as noted above.  Physical Exam    VS:  BP (!) 148/90 (BP Location: Left Arm, Patient Position: Sitting, Cuff Size: Normal)    Pulse 88    Ht 5\' 4"  (1.626 m)    Wt 135 lb 9.6 oz (61.5 kg)    BMI 23.28 kg/m  , BMI Body mass index is 23.28 kg/m.  Wt Readings from Last 3 Encounters:  08/15/21 135 lb 9.6 oz (61.5 kg)  07/17/21 135 lb (61.2 kg)  03/27/21 123 lb 7.3 oz (56 kg)    GEN: Well nourished, well developed, in no acute distress. HEENT: normal. Neck: Supple, no JVD, carotid bruits, or masses. Cardiac: irregularly irregular, no murmurs, rubs, or gallops. No clubbing, cyanosis, edema.  Radials/PT 2+ and equal bilaterally.  Respiratory:  Respirations regular and unlabored, clear to auscultation bilaterally. GI: Soft, nontender, nondistended. MS: No deformity or atrophy. Skin: Warm and dry, no rash. Neuro:  Strength and sensation are intact. Psych: Normal affect.  Assessment & Plan    Hypertensive heart disease without heart failure - Euvolemic on exam. Reports 3 day history of dizziness most consistent with vertigo. Previously intolerant of meclizine. Educational handout provided. BP mildly elevated today though recently SBP 110s at home. Continue present antihypertensive regimen. Discussed possible discontinuation of HCTZ but she is concerned about edema. If persistent hypotension, consider reduced dose Diltiazem.  Hyperlipidemia- Lipid panel 06/30/21 total cholesterol 166, triglycerides 116, HDL 57, LDL 88. Continue atorvastatin. Denies myalgias.   Persistent atrial fibrillation /chronic anticoagulation - Rate controlled today. Continue Diltiazem and Toprol at present doses.  She is on Eliquis 5 mg twice daily.  She previously had a weight less than 133 pounds after her surgery May through September of last year and was on reduced dose but has since  consistently been above 133 lbs. As such, continue Eliquis 5mg  BID. She will report weight gain consistently <133 lbs.   Lower extremity edema -stable at baseline.  Continue to elevate lower extremities when sitting. Prefers to remain on her HCTZ.   Disposition: Follow up in April with Robin Latch, [name]. She prefers to keep her follow up as scheduled.  Signed, Loel Dubonnet, NP 08/15/2021, 2:24 PM [place]

## 2021-08-15 ENCOUNTER — Ambulatory Visit (HOSPITAL_BASED_OUTPATIENT_CLINIC_OR_DEPARTMENT_OTHER): Payer: Medicare PPO | Admitting: Family

## 2021-08-15 ENCOUNTER — Encounter (HOSPITAL_BASED_OUTPATIENT_CLINIC_OR_DEPARTMENT_OTHER): Payer: Self-pay | Admitting: Family

## 2021-08-15 ENCOUNTER — Other Ambulatory Visit: Payer: Self-pay

## 2021-08-15 VITALS — BP 148/90 | HR 88 | Ht 64.0 in | Wt 135.6 lb

## 2021-08-15 DIAGNOSIS — I4819 Other persistent atrial fibrillation: Secondary | ICD-10-CM | POA: Diagnosis not present

## 2021-08-15 DIAGNOSIS — C44519 Basal cell carcinoma of skin of other part of trunk: Secondary | ICD-10-CM | POA: Diagnosis not present

## 2021-08-15 DIAGNOSIS — E782 Mixed hyperlipidemia: Secondary | ICD-10-CM | POA: Diagnosis not present

## 2021-08-15 DIAGNOSIS — D6859 Other primary thrombophilia: Secondary | ICD-10-CM | POA: Diagnosis not present

## 2021-08-15 DIAGNOSIS — D485 Neoplasm of uncertain behavior of skin: Secondary | ICD-10-CM | POA: Diagnosis not present

## 2021-08-15 DIAGNOSIS — I10 Essential (primary) hypertension: Secondary | ICD-10-CM

## 2021-08-15 DIAGNOSIS — L570 Actinic keratosis: Secondary | ICD-10-CM | POA: Diagnosis not present

## 2021-08-15 DIAGNOSIS — C44319 Basal cell carcinoma of skin of other parts of face: Secondary | ICD-10-CM | POA: Diagnosis not present

## 2021-08-15 NOTE — Patient Instructions (Addendum)
Medication Instructions:  Continue your current medications.   Recommend taking your morning medications at least 30 minutes after you finish your breakfast and have had at least 16 oz of fluid.   If you continue to have more episodes of dizziness or your blood pressure is persistently less than 100/60, please let us know and we may consider reducing your dose of Diltiazem to prevent your blood pressure from dropping low.   *If you need a refill on your cardiac medications before your next appointment, please call your pharmacy*  Lab Work: None ordered today.   Testing/Procedures: None ordered today.   Follow-Up: At [facility], you and your health needs are our priority.  As part of our continuing mission to provide you with exceptional heart care, we have created designated Provider Care Teams.  These Care Teams include your primary Cardiologist (physician) and Advanced Practice Providers (APPs -  Physician Assistants and Nurse Practitioners) who all work together to provide you with the care you need, when you need it.  We recommend signing up for the patient portal called "MyChart".  Sign up information is provided on this After Visit Summary.  MyChart is used to connect with patients for Virtual Visits (Telemedicine).  Patients are able to view lab/test results, encounter notes, upcoming appointments, etc.  Non-urgent messages can be sent to your provider as well.   To learn more about what you can do with MyChart, go to [email or web address].    Your next appointment:   As scheduled in April with Dr. Oval Linsey   Other Instructions  Your dizziness sounds similar to vertigo. You could ask your family member to help you with what is called the Epley maneuver (you can look up a Youtube video) if you have vertigo. Additional information about vertigo is included below.  Prevent lightheadedness by staying well hydrated, eating regular meals, staying well hydrated.   [facility]  Stroke Support Group: Contact: Ms. Page Spiro, DPT [phone] [email or web address] Meeting on the 2nd Thursday of each month 4pm-5pm at [facility] at [place] [phone])   Vertigo Vertigo is the feeling that you or the things around you are moving when they are not. This feeling can come and go at any time. Vertigo often goes away on its own. This condition can be dangerous if it happens when you are doing activities like driving or working with machines. Your doctor will do tests to find the cause of your vertigo. These tests will also help your doctor decide on the best treatment for you. Follow these instructions at home: Eating and drinking Drink enough fluid to keep your pee (urine) pale yellow. Do not drink alcohol. Activity Return to your normal activities when your doctor says that it is safe. In the morning, first sit up on the side of the bed. When you feel okay, stand slowly while you hold onto something until you know that your balance is fine. Move slowly. Avoid sudden body or head movements or certain positions, as told by your doctor. Use a cane if you have trouble standing or walking. Sit down right away if you feel dizzy. Avoid doing any tasks or activities that can cause danger to you or others if you get dizzy. Avoid bending down if you feel dizzy. Place items in your home so that they are easy for you to reach without bending or leaning over. Do not drive or use machinery if you feel dizzy. General instructions Take over-the-counter and prescription medicines only  as told by your doctor. Keep all follow-up visits. Contact a doctor if: Your medicine does not help your vertigo. Your problems get worse or you have new symptoms. You have a fever. You feel like you may vomit (nauseous), or this feeling gets worse. You start to vomit. Your family or friends see changes in how you act. You lose feeling (have numbness) in part of your  body. You feel prickling and tingling in a part of your body. Get help right away if: You are always dizzy. You faint. You get very bad headaches. You get a stiff neck. Bright light starts to bother you. You have trouble moving or talking. You feel weak in your hands, arms, or legs. You have changes in your hearing or in how you see (vision). These symptoms may be an emergency. Get help right away. Call your local emergency services (911 in the [place] Do not wait to see if the symptoms will go away. Do not drive yourself to the hospital. Summary Vertigo is the feeling that you or the things around you are moving when they are not. Your doctor will do tests to find the cause of your vertigo. You may be told to avoid some tasks, positions, or movements. Contact a doctor if your medicine is not helping, or if you have a fever, new symptoms, or a change in how you act. Get help right away if you get very bad headaches, or if you have changes in how you speak, hear, or see. This information is not intended to replace advice given to you by your health care provider. Make sure you discuss any questions you have with your health care provider. Document Revised: 05/04/2020 Document Reviewed: 05/04/2020 Elsevier Patient Education  2022 [employer].

## 2021-08-22 ENCOUNTER — Other Ambulatory Visit: Payer: Self-pay | Admitting: Cardiovascular Disease

## 2021-08-22 NOTE — Telephone Encounter (Signed)
Rx(s) sent to pharmacy electronically.  

## 2021-08-30 DIAGNOSIS — H401131 Primary open-angle glaucoma, bilateral, mild stage: Secondary | ICD-10-CM | POA: Diagnosis not present

## 2021-09-11 DIAGNOSIS — C44319 Basal cell carcinoma of skin of other parts of face: Secondary | ICD-10-CM | POA: Diagnosis not present

## 2021-09-20 ENCOUNTER — Other Ambulatory Visit (HOSPITAL_BASED_OUTPATIENT_CLINIC_OR_DEPARTMENT_OTHER): Payer: Self-pay | Admitting: Cardiovascular Disease

## 2021-09-20 NOTE — Telephone Encounter (Signed)
Rx(s) sent to pharmacy electronically.  

## 2021-09-26 ENCOUNTER — Other Ambulatory Visit: Payer: Self-pay | Admitting: Ophthalmology

## 2021-09-26 DIAGNOSIS — D485 Neoplasm of uncertain behavior of skin: Secondary | ICD-10-CM | POA: Diagnosis not present

## 2021-09-26 DIAGNOSIS — D23122 Other benign neoplasm of skin of left lower eyelid, including canthus: Secondary | ICD-10-CM | POA: Diagnosis not present

## 2021-09-26 DIAGNOSIS — D0439 Carcinoma in situ of skin of other parts of face: Secondary | ICD-10-CM | POA: Diagnosis not present

## 2021-09-26 DIAGNOSIS — C44329 Squamous cell carcinoma of skin of other parts of face: Secondary | ICD-10-CM | POA: Diagnosis not present

## 2021-10-02 ENCOUNTER — Encounter (HOSPITAL_BASED_OUTPATIENT_CLINIC_OR_DEPARTMENT_OTHER): Payer: Self-pay | Admitting: Cardiovascular Disease

## 2021-10-02 ENCOUNTER — Ambulatory Visit (HOSPITAL_BASED_OUTPATIENT_CLINIC_OR_DEPARTMENT_OTHER): Payer: Medicare PPO | Admitting: Cardiovascular Disease

## 2021-10-02 VITALS — BP 134/78 | HR 83 | Ht 64.0 in | Wt 136.7 lb

## 2021-10-02 DIAGNOSIS — E7800 Pure hypercholesterolemia, unspecified: Secondary | ICD-10-CM | POA: Diagnosis not present

## 2021-10-02 DIAGNOSIS — I480 Paroxysmal atrial fibrillation: Secondary | ICD-10-CM | POA: Diagnosis not present

## 2021-10-02 DIAGNOSIS — I10 Essential (primary) hypertension: Secondary | ICD-10-CM

## 2021-10-02 NOTE — Assessment & Plan Note (Addendum)
Currently in sinus rhythm.  Continue Eliquis, diltiazem and metoprolol.  She is going to have a colonoscopy in a couple months.  She is concerned about holding Eliquis prior to colonoscopy due to having a stroke while her anticoagulation was held in the past.  She would prefer to be bridged with Lovenox.  This is reasonable.  When we get the request we will have Pharm.D. advised her on Lovenox bridging. ?

## 2021-10-02 NOTE — Progress Notes (Signed)
? ?Cardiology Office Note ? ?Date:  10/02/2021  ? ?ID:  Robin [name], DOB Dec 21, 1934, MRN [id number] ? ?PCP:  [name], PA-C  ?Cardiologist:   Skeet Latch, MD  ? ?No chief complaint on file. ? ?History of Present Illness: ?Robin [name] is a 87 y.o. female with hypertension, paroxysmal atrial fibrillation, hyperlipidemia and mild carotid stenosis who presents for follow-up.  Robin [name] is a former patient of Dr. Mare Ferrari. She had a heart cath in 2009 that was normal.  Carotid Doppler 08/2013 revealed <50% stenosis bilaterally. She reported recurrent palpitations and wore a 30 day event monitor 10/2015 that showed occasional short episodes of atrial fibrillation.  She has been managed on Eliquis, diltiazem and metoprolol.  She is not interested in statins.  Robin [name] reported episodes of near syncope.  She noted that her heart was irregular but in clinic she was found to have a wandering pacemaker.  She was referred for a Lexiscan Myoview 02/2018 which revealed LVEF 81% and no ischemia.  Since that time she took a trip to the [place] and had a recurrent episode.  She discovered that she was actually having vertigo. She has intermittently struggled with chest pain that improved with belching. She previously had ankle edema that improved after a vein and vascular procedure. She had colon cancer and a laparoscopic hemicolectomy 10/2020. She followed up with her surgical team and was doing well. ? ?At her last appointment she had gradually been feeling stronger, with increased appetites and regaining some weight. She had started Levaquin. At home her blood pressure was averaging in the 90W systolic. She followed up with Laurann Montana, NP on 08/15/2021 and reported residual fatigue from her COVID infection 06/28/2021. Her dizziness had improved after starting to take her potassium in the evenings. It was noted that she was back on triamterene-HCTZ, although this had been switched to HCTZ alone  during her admission in 03/2021.  ? ?Today, she presents a BP log showing average readings of 120s-130s at home. Most of the time she is unaware of her atrial fibrillation unless she checks her pulse. She was struggling with dizziness after taking her morning medications. She had started taking potassium in the evening and stopped taking Vitamin A. Lately she has not experienced any dizziness. She does complain of some central chest discomfort, which may or may not be related to indigestion. When she has indigestion, most recently last night due to eating certain foods, she will belch frequently. She endorses seasonal allergies, and bronchial issues. Additionally she has noticed some bilateral LE weakness since her COVID infection. She has tried wearing her compression hose again. She complains of a "white spot" in her left eye. She denies any palpitations, or peripheral edema. No headaches, syncope, orthopnea, or PND. ? ? ?Past Medical History:  ?Diagnosis Date  ? Anxiety   ? AV bloc first degree   ? Carotid stenosis 03/02/2017  ? Hyperlipidemia   ? Hypertension   ? Lower extremity edema 12/30/2020  ? Lumbar pain   ? fell off of her horse and has low back pain at times  ? Persistent atrial fibrillation ([place]) 12/30/2020  ? Venous insufficiency of both lower extremities   ? Vertigo   ? ? ?Past Surgical History:  ?Procedure Laterality Date  ? ABDOMINAL HYSTERECTOMY    ? BIOPSY  10/28/2020  ? Procedure: BIOPSY;  Surgeon: Milus Banister, MD;  Location: [facility];  Service: Endoscopy;;  ? BREAST BIOPSY Left   ? BREAST EXCISIONAL  BIOPSY Right   ? BREAST LUMPECTOMY WITH RADIOACTIVE SEED LOCALIZATION Left 05/05/2015  ? Procedure: LEFT BREAST LUMPECTOMY WITH RADIOACTIVE SEED LOCALIZATION;  Surgeon: Autumn Messing III, MD;  Location: [place];  Service: General;  Laterality: Left;  ? CHOLECYSTECTOMY    ? COLON RESECTION N/A 10/31/2020  ? Procedure: LAPAROSCOPIC RIGHT COLON RESECTION, lysis of adhesions,  hemicolectomy, bilateral tap block;  Surgeon: [name], MD;  [facility];  Service: General;  Laterality: N/A;  ? COLONOSCOPY WITH PROPOFOL N/A 10/28/2020  ? Procedure: COLONOSCOPY WITH PROPOFOL;  Surgeon: Milus Banister, MD;  [facility];  Service: Endoscopy;  Laterality: N/A;  ? EYE SURGERY    ? bil cataract surgery  ? IR ANGIO INTRA EXTRACRAN SEL INTERNAL CAROTID BILAT MOD SED  03/22/2021  ? IR ANGIO VERTEBRAL SEL VERTEBRAL BILAT MOD SED  03/22/2021  ? LAPAROTOMY N/A 03/17/2021  ? Procedure: EXPLORATORY LAPAROTOMY/LYSIS OF ADHESIONS;  Surgeon: [name], MD;  [facility];  Service: General;  Laterality: N/A;  ? LUNG SURGERY    ? POLYPECTOMY  10/28/2020  ? Procedure: POLYPECTOMY;  Surgeon: Milus Banister, MD;  Location: [facility];  Service: Endoscopy;;  ? RADIOLOGY WITH ANESTHESIA N/A 03/22/2021  ? Procedure: IR WITH ANESTHESIA;  Surgeon: Radiologist, Medication, MD;  Location: [place];  Service: Radiology;  Laterality: N/A;  ? SUBMUCOSAL TATTOO INJECTION  10/28/2020  ? Procedure: SUBMUCOSAL TATTOO INJECTION;  Surgeon: Milus Banister, MD;  Location: [facility];  Service: Endoscopy;;  ? ? ?Current Outpatient Medications  ?Medication Sig Dispense Refill  ? acetaminophen (TYLENOL) 325 MG tablet Take 2 tablets (650 mg total) by mouth every 6 (six) hours as needed for mild pain, fever or headache.    ? Cholecalciferol (VITAMIN D) 50 MCG (2000 UT) tablet Take 2,000 Units by mouth at bedtime.    ? diphenoxylate-atropine (LOMOTIL) 2.5-0.025 MG tablet Take 1 tablet by mouth as needed.    ? ELIQUIS 5 MG TABS tablet TAKE 1 TABLET BY MOUTH TWICE DAILY. 180 tablet 1  ? erythromycin ophthalmic ointment Place into the left eye 3 (three) times daily.    ? estradiol (ESTRACE) 1 MG tablet Take 0.5 mg by mouth daily.    ? ferrous sulfate 325 (65 FE) MG tablet Take 1 tablet (325 mg total) by mouth at bedtime.    ? Lactobacillus Rhamnosus, GG, (CULTURELLE PO) Take 1 capsule by mouth in the morning.    ?  Lutein 20 MG CAPS Take 1 capsule by mouth every evening.    ? metoprolol succinate (TOPROL-XL) 50 MG 24 hr tablet Take 2 tablets (100 mg total) by mouth daily. 180 tablet 3  ? mometasone (ELOCON) 0.1 % lotion Apply 1 drop topically at bedtime. To forehead    ? ondansetron (ZOFRAN ODT) 4 MG disintegrating tablet Take 1 tablet (4 mg total) by mouth every 8 (eight) hours as needed for nausea or vomiting. 20 tablet 0  ? pantoprazole (PROTONIX) 40 MG tablet Take 1 tablet (40 mg total) by mouth daily. 30 tablet 0  ? polycarbophil (FIBERCON) 625 MG tablet Take 1 tablet (625 mg total) by mouth daily. 60 tablet 10  ? potassium chloride SA (KLOR-CON M) 20 MEQ tablet TAKE 1 TABLET BY MOUTH TWICE DAILY. 180 tablet 2  ? TIADYLT ER 180 MG 24 hr capsule TAKE (1) CAPSULE DAILY. 90 capsule 2  ? triamcinolone cream (KENALOG) 0.1 % Apply 1 application topically daily as needed (for rash).     ? triamterene-hydrochlorothiazide (MAXZIDE-25)  37.5-25 MG tablet Take 1 tablet by mouth daily.    ? Vitamins A & D (VITAMIN A & D) 10000-400 units TABS Take 1 capsule by mouth daily at 12 noon.    ? ?No current facility-administered medications for this visit.  ? ? ?Allergies:   Iodinated contrast media, Amlodipine, Codeine, Diphenhydramine hcl, Pravastatin, Zetia [ezetimibe], Sulfa antibiotics, and Sulfa drugs cross reactors  ? ? ?Social History:  The patient  reports that she has never smoked. She has never used smokeless tobacco. She reports current alcohol use. She reports that she does not use drugs.  ? ?Family History:  The patient's family history includes Cancer in her sister; Heart disease in her father and mother; Hypertension in her brother, mother, sister, sister, and sister; Stroke in her mother.  ? ? ?ROS:   ?Please see the history of present illness. ?(+) Chest discomfort ?(+) Indigestion ?(+) Bilateral LE weakness ?(+) Seasonal allergies ?All other systems are reviewed and negative.  ? ? ?PHYSICAL EXAM: ?VS:  BP 134/78 (BP  Location: Right Arm, Patient Position: Sitting, Cuff Size: Normal)   Pulse 83   Ht '5\' 4"'$  (1.626 m)   Wt 136 lb 11.2 oz (62 kg)   SpO2 99%   BMI 23.46 kg/m?  , BMI Body mass index is 23.46 kg/m?. ?GENERAL:  Well appearing

## 2021-10-02 NOTE — Patient Instructions (Signed)
Medication Instructions:  ?Your Physician recommend you continue on your current medication as directed.   ? ?*If you need a refill on your cardiac medications before your next appointment, please call your pharmacy* ? ? ?Lab Work: ?None ordered today ? ? ?Testing/Procedures: ?None ordered today ? ? ?Follow-Up: ?At [facility], you and your health needs are our priority.  As part of our continuing mission to provide you with exceptional heart care, we have created designated Provider Care Teams.  These Care Teams include your primary Cardiologist (physician) and Advanced Practice Providers (APPs -  Physician Assistants and Nurse Practitioners) who all work together to provide you with the care you need, when you need it. ? ?We recommend signing up for the patient portal called "MyChart".  Sign up information is provided on this After Visit Summary.  MyChart is used to connect with patients for Virtual Visits (Telemedicine).  Patients are able to view lab/test results, encounter notes, upcoming appointments, etc.  Non-urgent messages can be sent to your provider as well.   ?To learn more about what you can do with MyChart, go to [email or web address].   ? ?Your next appointment:   ?6 month(s) ? ?The format for your next appointment:   ?In Person ? ?Provider:   ?Skeet Latch, MD{ ? ? ?Important Information About Sugar ? ? ? ? ? ? ?

## 2021-10-02 NOTE — Assessment & Plan Note (Signed)
Blood pressure is controlled at home.  She has superimposed white coat hypertension.  It was better on repeat.  Continue HCTZ-triamterene, diltiazem and metoprolol.   ?

## 2021-10-06 ENCOUNTER — Telehealth: Payer: Self-pay

## 2021-10-06 NOTE — Telephone Encounter (Signed)
Called to discuss PREP program; Prefers [place], but class times not convenient as she lives in [place]; plan is for her to drive to [place] next week, so she can determine how long the drive is for her, then may attend 5/9 class that starts at 10am if she is comfortable with the drive.  ?

## 2021-10-09 ENCOUNTER — Other Ambulatory Visit: Payer: Self-pay | Admitting: Cardiovascular Disease

## 2021-10-09 DIAGNOSIS — I4819 Other persistent atrial fibrillation: Secondary | ICD-10-CM

## 2021-10-10 NOTE — Telephone Encounter (Signed)
Prescription refill request for Eliquis received. ?Indication: PAF ?Last office visit: 10/02/21  Berneice Gandy MD ?Scr: 0.80 on 06/28/21 ?Age: 88 ?Weight: 62kg ? ?Based on above findings Eliquis '5mg'$  twice daily is the appropriate dose.  Refill approved. ? ?

## 2021-10-16 ENCOUNTER — Telehealth: Payer: Self-pay

## 2021-10-16 NOTE — Telephone Encounter (Signed)
Called to discuss PREP class, thinks 10am class is too early, offered next class that starts May 22 12-1:15 which works well for; will contact later this month to set up assessment visit week of May 15. ?

## 2021-10-17 ENCOUNTER — Telehealth (HOSPITAL_BASED_OUTPATIENT_CLINIC_OR_DEPARTMENT_OTHER): Payer: Self-pay

## 2021-10-17 DIAGNOSIS — I4819 Other persistent atrial fibrillation: Secondary | ICD-10-CM

## 2021-10-17 NOTE — Telephone Encounter (Signed)
Patient requesting Eliquis refill please to [place]  ?

## 2021-10-18 MED ORDER — APIXABAN 5 MG PO TABS: 5.0000 mg | ORAL_TABLET | Freq: Two times a day (BID) | ORAL | 0 refills | Status: DC

## 2021-10-18 NOTE — Addendum Note (Signed)
Addended by: Rollen Sox on: 10/18/2021 07:35 AM ? ? Modules accepted: Orders ? ?

## 2021-10-18 NOTE — Telephone Encounter (Signed)
Rx sent to [facility] on 4/25.  Will send to [facility]. ?

## 2021-10-19 ENCOUNTER — Other Ambulatory Visit: Payer: Self-pay

## 2021-10-19 DIAGNOSIS — I4819 Other persistent atrial fibrillation: Secondary | ICD-10-CM

## 2021-10-19 MED ORDER — APIXABAN 5 MG PO TABS: 5.0000 mg | ORAL_TABLET | Freq: Two times a day (BID) | ORAL | 1 refills | Status: DC

## 2021-10-30 ENCOUNTER — Telehealth: Payer: Self-pay

## 2021-10-30 NOTE — Telephone Encounter (Signed)
Called to discuss schedule change for next PREP class; is able to attend class starting June 5, every M/W 12-1:115p. Will contact end of May to set up assessment visit. ?

## 2021-11-03 DIAGNOSIS — R350 Frequency of micturition: Secondary | ICD-10-CM | POA: Diagnosis not present

## 2021-11-03 DIAGNOSIS — E559 Vitamin D deficiency, unspecified: Secondary | ICD-10-CM | POA: Diagnosis not present

## 2021-11-03 DIAGNOSIS — R109 Unspecified abdominal pain: Secondary | ICD-10-CM | POA: Diagnosis not present

## 2021-11-03 DIAGNOSIS — D508 Other iron deficiency anemias: Secondary | ICD-10-CM | POA: Diagnosis not present

## 2021-11-08 ENCOUNTER — Telehealth: Payer: Self-pay

## 2021-11-08 NOTE — Telephone Encounter (Signed)
Called to confirm participation in [place] on June 5, assessment visit scheduled for Thursday June 1 at 12 noon.

## 2021-11-16 NOTE — Progress Notes (Signed)
[facility] PREP Evaluation  Patient Details  Name: Robin [name] MRN: [id number] Date of Birth: 1935/06/10 Age: 87 y.o. PCP: [name], PA-C  Vitals:   11/16/21 1229  BP: 140/80  Pulse: 85  SpO2: 99%  Weight: 137 lb 3.2 oz (62.2 kg)     [facility] Eval - 11/16/21 1200       [facility] "PREP" Location   [facility] "PREP" Location [place]      Referral    Referring Provider Oval Linsey    Reason for referral Hypertension;Inactivity;Stroke    Program Start Date 11/20/21      Measurement   Waist Circumference 33.5 inches    Hip Circumference 39.5 inches    Body fat 36.9 percent      Information for Trainer   Goals --   Establish safe exercise program; walking 5000 steps a day by end of program   Current Exercise walking    Orthopedic Concerns --   R sided weakness; arthritis in back   Pertinent Medical History --   Cancer, CVA, HTN, Afib   Current Barriers --   driving distance from home >20 miles   Medications that affect exercise Beta blocker      Timed Up and Go (TUGS)   Timed Up and Go Moderate risk 10-12 seconds      Mobility and Daily Activities   I find it easy to walk up or down two or more flights of stairs. 1    I have no trouble taking out the trash. 4    I do housework such as vacuuming and dusting on my own without difficulty. 4    I can easily lift a gallon of milk (8lbs). 1    I can easily walk a mile. 1    I have no trouble reaching into high cupboards or reaching down to pick up something from the floor. 1    I do not have trouble doing out-door work such as Armed forces logistics/support/administrative officer, raking leaves, or gardening. 1      Mobility and Daily Activities   I feel younger than my age. 3    I feel independent. 4    I feel energetic. 3    I live an active life.  1    I feel strong. 2    I feel healthy. 2    I feel active as other people my age. 3      How fit and strong are you.   Fit and Strong Total Score 31            Past Medical History:  Diagnosis  Date   Anxiety    AV bloc first degree    Carotid stenosis 03/02/2017   Hyperlipidemia    Hypertension    Lower extremity edema 12/30/2020   Lumbar pain    fell off of her horse and has low back pain at times   Persistent atrial fibrillation ([place]) 12/30/2020   Venous insufficiency of both lower extremities    Vertigo    Past Surgical History:  Procedure Laterality Date   ABDOMINAL HYSTERECTOMY     BIOPSY  10/28/2020   Procedure: BIOPSY;  Surgeon: Milus Banister, MD;  [facility];  Service: Endoscopy;;   BREAST BIOPSY Left    BREAST EXCISIONAL BIOPSY Right    BREAST LUMPECTOMY WITH RADIOACTIVE SEED LOCALIZATION Left 05/05/2015   Procedure: LEFT BREAST LUMPECTOMY WITH RADIOACTIVE SEED LOCALIZATION;  Surgeon: Autumn Messing III, MD;  Location: [facility];  Service: General;  Laterality: Left;   CHOLECYSTECTOMY     COLON RESECTION N/A 10/31/2020   Procedure: LAPAROSCOPIC RIGHT COLON RESECTION, lysis of adhesions, hemicolectomy, bilateral tap block;  Surgeon: [name], MD;  [facility];  Service: General;  Laterality: N/A;   COLONOSCOPY WITH PROPOFOL N/A 10/28/2020   Procedure: COLONOSCOPY WITH PROPOFOL;  Surgeon: Milus Banister, MD;  [facility];  Service: Endoscopy;  Laterality: N/A;   EYE SURGERY     bil cataract surgery   IR ANGIO INTRA EXTRACRAN SEL INTERNAL CAROTID BILAT MOD SED  03/22/2021   IR ANGIO VERTEBRAL SEL VERTEBRAL BILAT MOD SED  03/22/2021   LAPAROTOMY N/A 03/17/2021   Procedure: EXPLORATORY LAPAROTOMY/LYSIS OF ADHESIONS;  Surgeon: [name], MD;  [facility];  Service: General;  Laterality: N/A;   LUNG SURGERY     POLYPECTOMY  10/28/2020   Procedure: POLYPECTOMY;  Surgeon: Milus Banister, MD;  [facility];  Service: Endoscopy;;   RADIOLOGY WITH ANESTHESIA N/A 03/22/2021   Procedure: IR WITH ANESTHESIA;  Surgeon: Radiologist, Medication, MD;  Location: [place];  Service: Radiology;  Laterality: N/A;   SUBMUCOSAL  TATTOO INJECTION  10/28/2020   Procedure: SUBMUCOSAL TATTOO INJECTION;  Surgeon: Milus Banister, MD;  [facility];  Service: Endoscopy;;   Social History   Tobacco Use  Smoking Status Never  Smokeless Tobacco Never  To begin PREP class June 5, every M/W 12-1:15  Yevonne Aline 11/16/2021, 12:34 PM

## 2021-11-20 NOTE — Progress Notes (Signed)
[facility] PREP Weekly Session  Patient Details  Name: GWENNETH WHITEMAN MRN: [id number] Date of Birth: 1935-04-16 Age: 87 y.o. PCP: [name], PA-C  There were no vitals filed for this visit.   [facility] Weekly seesion - 11/20/21 1300       [facility] "PREP" Location   [facility] "PREP" Location [name]      Weekly Session   Topic Discussed Goal setting and welcome to the program   Review of workbook, tour of facility, option for intro to cardio workout on machine            Rmoni Keplinger B Aaliyana Fredericks 11/20/2021, 1:15 PM

## 2021-11-23 DIAGNOSIS — C44519 Basal cell carcinoma of skin of other part of trunk: Secondary | ICD-10-CM | POA: Diagnosis not present

## 2021-11-23 DIAGNOSIS — L989 Disorder of the skin and subcutaneous tissue, unspecified: Secondary | ICD-10-CM | POA: Diagnosis not present

## 2021-12-05 DIAGNOSIS — C44629 Squamous cell carcinoma of skin of left upper limb, including shoulder: Secondary | ICD-10-CM | POA: Diagnosis not present

## 2021-12-05 DIAGNOSIS — D485 Neoplasm of uncertain behavior of skin: Secondary | ICD-10-CM | POA: Diagnosis not present

## 2021-12-05 DIAGNOSIS — D0439 Carcinoma in situ of skin of other parts of face: Secondary | ICD-10-CM | POA: Diagnosis not present

## 2021-12-16 ENCOUNTER — Other Ambulatory Visit: Payer: Self-pay | Admitting: Neurology

## 2021-12-26 DIAGNOSIS — R197 Diarrhea, unspecified: Secondary | ICD-10-CM | POA: Diagnosis not present

## 2021-12-26 DIAGNOSIS — D509 Iron deficiency anemia, unspecified: Secondary | ICD-10-CM | POA: Diagnosis not present

## 2021-12-26 DIAGNOSIS — Z85038 Personal history of other malignant neoplasm of large intestine: Secondary | ICD-10-CM | POA: Diagnosis not present

## 2022-01-08 ENCOUNTER — Encounter: Payer: Self-pay | Admitting: *Deleted

## 2022-01-08 NOTE — Progress Notes (Signed)
[facility] PREP Weekly Session  Patient Details  Name: Robin [name] MRN: [id number] Date of Birth: May 07, 1935 Age: 87 y.o. PCP: [name], PA-C  Vitals:   01/08/22 1258  Weight: 137 lb (62.1 kg)     [facility] Weekly seesion - 01/08/22 1200       [facility] "PREP" Location   [facility] "PREP" Location [name]      Weekly Session   Topic Discussed Expectations and non-scale victories   Half way through program. Revisit/restate goals.Discuss challenges and non-scale victories.   Minutes exercised this week 70 minutes    Classes attended to date [name], [name] 01/08/2022, 1:00 PM

## 2022-01-15 ENCOUNTER — Encounter: Payer: Self-pay | Admitting: *Deleted

## 2022-01-15 NOTE — Progress Notes (Signed)
[facility] PREP Weekly Session  Patient Details  Name: DEVEN AUDI MRN: [id number] Date of Birth: 02/01/1935 Age: 87 y.o. PCP: [name], PA-C  Vitals:   01/15/22 1319  Weight: 137 lb 8 oz (62.4 kg)     [facility] Weekly seesion - 01/15/22 1300       [facility] "PREP" Location   [facility] "PREP" Location [name]      Weekly Session   Topic Discussed Other   Portion control, label review, visualize your portion control demo   Minutes exercised this week 90 minutes    Classes attended to date [place], [place] 01/15/2022, 1:22 PM

## 2022-01-16 DIAGNOSIS — L989 Disorder of the skin and subcutaneous tissue, unspecified: Secondary | ICD-10-CM | POA: Diagnosis not present

## 2022-01-16 DIAGNOSIS — D0462 Carcinoma in situ of skin of left upper limb, including shoulder: Secondary | ICD-10-CM | POA: Diagnosis not present

## 2022-01-16 DIAGNOSIS — D485 Neoplasm of uncertain behavior of skin: Secondary | ICD-10-CM | POA: Diagnosis not present

## 2022-01-22 NOTE — Progress Notes (Signed)
[facility] PREP Weekly Session  Patient Details  Name: Robin [name] MRN: [id number] Date of Birth: 12/06/1934 Age: 87 y.o. PCP: [name], PA-C  Vitals:   01/22/22 1312  Weight: 134 lb 6.4 oz (61 kg)     [facility] Weekly seesion - 01/22/22 1300       [facility] "PREP" Location   [facility] "PREP" Location [name]      Weekly Session   Topic Discussed Finding support   Group Review of labels from foods brought in from home   Classes attended to date [place] 01/22/2022, 1:13 PM

## 2022-01-29 NOTE — Progress Notes (Signed)
[facility] PREP Weekly Session  Patient Details  Name: Robin [name] MRN: [id number] Date of Birth: 01-07-1935 Age: 87 y.o. PCP: [name], PA-C  Vitals:   01/29/22 1317  Weight: 136 lb 12.8 oz (62.1 kg)     [facility] Weekly seesion - 01/29/22 1300       [facility] "PREP" Location   [facility] "PREP" Location [name]      Weekly Session   Topic Discussed Calorie breakdown   Carbs (simple vs. complex), fats and proteins guidelines, nutrient dense foods.   Minutes exercised this week 175 minutes    Classes attended to date [place] 01/29/2022, 1:20 PM

## 2022-02-05 NOTE — Progress Notes (Signed)
[facility] PREP Weekly Session  Patient Details  Name: Robin [name] MRN: [id number] Date of Birth: May 03, 1935 Age: 87 y.o. PCP: [name], PA-C  Vitals:   02/05/22 1328  Weight: 137 lb 12.8 oz (62.5 kg)     [facility] Weekly seesion - 02/05/22 1300       [facility] "PREP" Location   [facility] "PREP" Location [name]      Weekly Session   Topic Discussed Hitting roadblocks   Final assessment visit scheduled for 8/30; review of goals and activity plan for next 3 months after PREP   Minutes exercised this week 200 minutes    Classes attended to date [place] 02/05/2022, 1:29 PM

## 2022-02-09 DIAGNOSIS — C44519 Basal cell carcinoma of skin of other part of trunk: Secondary | ICD-10-CM | POA: Diagnosis not present

## 2022-02-09 DIAGNOSIS — L989 Disorder of the skin and subcutaneous tissue, unspecified: Secondary | ICD-10-CM | POA: Diagnosis not present

## 2022-02-12 NOTE — Progress Notes (Signed)
[facility] PREP Weekly Session  Patient Details  Name: Robin [name] MRN: [id number] Date of Birth: 1934/06/27 Age: 87 y.o. PCP: [name], PA-C  Vitals:   02/12/22 1341  Weight: 138 lb 3.2 oz (62.7 kg)     [facility] Weekly seesion - 02/12/22 1300       [facility] "PREP" Location   [facility] "PREP" Location [name]      Weekly Session   Topic Discussed Other   How fit and strong survey and completed; fit testing completed; final assessment visit scheduled for Wednesday   Minutes exercised this week 70 minutes    Classes attended to date [place] 02/12/2022, 1:42 PM

## 2022-02-14 NOTE — Progress Notes (Signed)
[facility] PREP Evaluation  Patient Details  Name: Robin [name] MRN: [id number] Date of Birth: [birth year] Age: 87 y.o. PCP: [name], PA-C  Vitals:   02/14/22 1145  BP: (!) 148/62  Pulse: 91  SpO2: 99%  Weight: 139 lb 3.2 oz (63.1 kg)     [facility] Eval - 02/14/22 1100       [facility] "PREP" Location   [facility] "PREP" Location [place]      Referral    Program Start Date 02/14/22   Program end date     Measurement   Waist Circumference 35 inches    Hip Circumference 41 inches    Body fat 37.1 percent      Mobility and Daily Activities   I find it easy to walk up or down two or more flights of stairs. 1    I have no trouble taking out the trash. 4    I do housework such as vacuuming and dusting on my own without difficulty. 2    I can easily lift a gallon of milk (8lbs). 1    I can easily walk a mile. 3    I have no trouble reaching into high cupboards or reaching down to pick up something from the floor. 4    I do not have trouble doing out-door work such as Armed forces logistics/support/administrative officer, raking leaves, or gardening. 2      Mobility and Daily Activities   I feel younger than my age. 3    I feel independent. 4    I feel energetic. 3    I live an active life.  4    I feel strong. 3    I feel healthy. 3    I feel active as other people my age. 4      How fit and strong are you.   Fit and Strong Total Score 41            Past Medical History:  Diagnosis Date   Anxiety    AV bloc first degree    Carotid stenosis 03/02/2017   Hyperlipidemia    Hypertension    Lower extremity edema 12/30/2020   Lumbar pain    fell off of her horse and has low back pain at times   Persistent atrial fibrillation ([place]) 12/30/2020   Venous insufficiency of both lower extremities    Vertigo    Past Surgical History:  Procedure Laterality Date   ABDOMINAL HYSTERECTOMY     BIOPSY  10/28/2020   Procedure: BIOPSY;  Surgeon: Milus Banister, MD;  [facility];  Service:  Endoscopy;;   BREAST BIOPSY Left    BREAST EXCISIONAL BIOPSY Right    BREAST LUMPECTOMY WITH RADIOACTIVE SEED LOCALIZATION Left 05/05/2015   Procedure: LEFT BREAST LUMPECTOMY WITH RADIOACTIVE SEED LOCALIZATION;  Surgeon: Autumn Messing III, MD;  Location: [place];  Service: General;  Laterality: Left;   CHOLECYSTECTOMY     COLON RESECTION N/A 10/31/2020   Procedure: LAPAROSCOPIC RIGHT COLON RESECTION, lysis of adhesions, hemicolectomy, bilateral tap block;  Surgeon: [name], MD;  [facility];  Service: General;  Laterality: N/A;   COLONOSCOPY WITH PROPOFOL N/A 10/28/2020   Procedure: COLONOSCOPY WITH PROPOFOL;  Surgeon: Milus Banister, MD;  [facility];  Service: Endoscopy;  Laterality: N/A;   EYE SURGERY     bil cataract surgery   IR ANGIO INTRA EXTRACRAN SEL INTERNAL CAROTID BILAT MOD SED  03/22/2021   IR ANGIO VERTEBRAL  SEL VERTEBRAL BILAT MOD SED  03/22/2021   LAPAROTOMY N/A 03/17/2021   Procedure: EXPLORATORY LAPAROTOMY/LYSIS OF ADHESIONS;  Surgeon: [name], MD;  [facility];  Service: General;  Laterality: N/A;   LUNG SURGERY     POLYPECTOMY  10/28/2020   Procedure: POLYPECTOMY;  Surgeon: Milus Banister, MD;  [facility];  Service: Endoscopy;;   RADIOLOGY WITH ANESTHESIA N/A 03/22/2021   Procedure: IR WITH ANESTHESIA;  Surgeon: Radiologist, Medication, MD;  Location: [place];  Service: Radiology;  Laterality: N/A;   SUBMUCOSAL TATTOO INJECTION  10/28/2020   Procedure: SUBMUCOSAL TATTOO INJECTION;  Surgeon: Milus Banister, MD;  [facility];  Service: Endoscopy;;   Social History   Tobacco Use  Smoking Status Never  Smokeless Tobacco Never  Final Assessment Visit---no longer using a cane How fit and strong:  11/16/21: 31 02/14/22: 41 Workout sessions attended: 3 Education sessions completed: 9   Khaleed Holan B Orvis Stann 02/14/2022, 11:47 AM

## 2022-02-24 DIAGNOSIS — F419 Anxiety disorder, unspecified: Secondary | ICD-10-CM | POA: Diagnosis not present

## 2022-02-24 DIAGNOSIS — E876 Hypokalemia: Secondary | ICD-10-CM | POA: Diagnosis not present

## 2022-02-24 DIAGNOSIS — I700 Atherosclerosis of aorta: Secondary | ICD-10-CM | POA: Diagnosis not present

## 2022-02-24 DIAGNOSIS — E860 Dehydration: Secondary | ICD-10-CM | POA: Diagnosis not present

## 2022-02-24 DIAGNOSIS — K760 Fatty (change of) liver, not elsewhere classified: Secondary | ICD-10-CM | POA: Diagnosis not present

## 2022-02-24 DIAGNOSIS — R109 Unspecified abdominal pain: Secondary | ICD-10-CM | POA: Diagnosis not present

## 2022-02-24 DIAGNOSIS — I10 Essential (primary) hypertension: Secondary | ICD-10-CM | POA: Diagnosis not present

## 2022-02-24 DIAGNOSIS — R197 Diarrhea, unspecified: Secondary | ICD-10-CM | POA: Diagnosis not present

## 2022-02-24 DIAGNOSIS — K449 Diaphragmatic hernia without obstruction or gangrene: Secondary | ICD-10-CM | POA: Diagnosis not present

## 2022-02-24 DIAGNOSIS — K6389 Other specified diseases of intestine: Secondary | ICD-10-CM | POA: Diagnosis not present

## 2022-02-24 DIAGNOSIS — R112 Nausea with vomiting, unspecified: Secondary | ICD-10-CM | POA: Diagnosis not present

## 2022-02-24 DIAGNOSIS — N281 Cyst of kidney, acquired: Secondary | ICD-10-CM | POA: Diagnosis not present

## 2022-02-25 DIAGNOSIS — I700 Atherosclerosis of aorta: Secondary | ICD-10-CM | POA: Diagnosis not present

## 2022-02-25 DIAGNOSIS — K6389 Other specified diseases of intestine: Secondary | ICD-10-CM | POA: Diagnosis not present

## 2022-02-25 DIAGNOSIS — K449 Diaphragmatic hernia without obstruction or gangrene: Secondary | ICD-10-CM | POA: Diagnosis not present

## 2022-02-25 DIAGNOSIS — N281 Cyst of kidney, acquired: Secondary | ICD-10-CM | POA: Diagnosis not present

## 2022-03-14 DIAGNOSIS — L814 Other melanin hyperpigmentation: Secondary | ICD-10-CM | POA: Diagnosis not present

## 2022-03-14 DIAGNOSIS — Z86007 Personal history of in-situ neoplasm of skin: Secondary | ICD-10-CM | POA: Diagnosis not present

## 2022-03-14 DIAGNOSIS — L821 Other seborrheic keratosis: Secondary | ICD-10-CM | POA: Diagnosis not present

## 2022-03-14 DIAGNOSIS — Z85828 Personal history of other malignant neoplasm of skin: Secondary | ICD-10-CM | POA: Diagnosis not present

## 2022-03-14 DIAGNOSIS — L820 Inflamed seborrheic keratosis: Secondary | ICD-10-CM | POA: Diagnosis not present

## 2022-03-14 DIAGNOSIS — L570 Actinic keratosis: Secondary | ICD-10-CM | POA: Diagnosis not present

## 2022-03-14 DIAGNOSIS — Z4802 Encounter for removal of sutures: Secondary | ICD-10-CM | POA: Diagnosis not present

## 2022-03-14 DIAGNOSIS — L538 Other specified erythematous conditions: Secondary | ICD-10-CM | POA: Diagnosis not present

## 2022-03-14 DIAGNOSIS — D1801 Hemangioma of skin and subcutaneous tissue: Secondary | ICD-10-CM | POA: Diagnosis not present

## 2022-03-14 DIAGNOSIS — C44529 Squamous cell carcinoma of skin of other part of trunk: Secondary | ICD-10-CM | POA: Diagnosis not present

## 2022-03-14 DIAGNOSIS — Z08 Encounter for follow-up examination after completed treatment for malignant neoplasm: Secondary | ICD-10-CM | POA: Diagnosis not present

## 2022-03-15 DIAGNOSIS — F430 Acute stress reaction: Secondary | ICD-10-CM | POA: Diagnosis not present

## 2022-03-15 DIAGNOSIS — I10 Essential (primary) hypertension: Secondary | ICD-10-CM | POA: Diagnosis not present

## 2022-03-15 DIAGNOSIS — R112 Nausea with vomiting, unspecified: Secondary | ICD-10-CM | POA: Diagnosis not present

## 2022-03-15 DIAGNOSIS — E876 Hypokalemia: Secondary | ICD-10-CM | POA: Diagnosis not present

## 2022-03-15 DIAGNOSIS — Z6822 Body mass index (BMI) 22.0-22.9, adult: Secondary | ICD-10-CM | POA: Diagnosis not present

## 2022-03-15 DIAGNOSIS — I482 Chronic atrial fibrillation, unspecified: Secondary | ICD-10-CM | POA: Diagnosis not present

## 2022-03-26 ENCOUNTER — Telehealth: Payer: Self-pay | Admitting: Cardiovascular Disease

## 2022-03-26 ENCOUNTER — Telehealth (HOSPITAL_BASED_OUTPATIENT_CLINIC_OR_DEPARTMENT_OTHER): Payer: Self-pay | Admitting: Cardiovascular Disease

## 2022-03-26 ENCOUNTER — Telehealth: Payer: Self-pay | Admitting: Cardiology

## 2022-03-26 NOTE — Telephone Encounter (Signed)
Returned call to patient and provided the following recommendations, patient states a hesitancy to take another tablet due to the fact that it may make her use the bathroom more. Patient does endorse having a bug bag in september and has felt off ever since then. Reassured patient that taking one extra tablet today would be okay. Patient rechecked her blood pressure while on the phone and it is 172/102. Patient wanting to be seen today, patient informed that we do not have openings today, but got her scheduled for Wednesday with Criteria for presenting to the ED if needed.     "Take an extra Maxzide.  Keep tracking BP.  If this continues to be an issue she will need an appointment to be evaluated.   TCR "

## 2022-03-26 NOTE — Telephone Encounter (Signed)
Follow Up:      Patient said she talked to talk to [facility] please. Shee said she needs her anxiety medicine called in, She is upset about her blood pressure.

## 2022-03-26 NOTE — Telephone Encounter (Signed)
Returned call to patient who states that she is very tense about her blood pressure which is making it go higher. Patient requesting Lorazepam to help with her anxiety around her blood pressure. Again advised patient on conditions in which she should go to the urgent care or ED. Advised patient that she would need to call her primary care provider to have that prescription filled as we do not have a current prescription for this and cannot fill without patient being seen in the office.

## 2022-03-26 NOTE — Telephone Encounter (Signed)
Transferred call from call center. Patient stattes that she felt like her BP was elevated when she got up and she checked it and it was 171/92, took her '100mg'$  toprol and it came down to 159/83, this was about 30 minutes after medications. It has now been about an hour since she took her meds, she rechecked it while on the phone and it was 171/91. Patient reports no headache, blurry vision, chest pain, SHOB or dizziness.   Routing to Dr. Oval Linsey and PharmD for review for potential medication changes.

## 2022-03-26 NOTE — Telephone Encounter (Signed)
Pt called with continued elevated BP after taking extra maxide.  I asked her to take half of 50 mg toprol and she agreed.  BP 153/98 P in 80-90s.    Triage please check with pt on 03/27/22 to re-eval

## 2022-03-26 NOTE — Telephone Encounter (Signed)
Pt c/o BP issue: STAT if pt c/o blurred vision, one-sided weakness or slurred speech  1. What are your last 5 BP readings?   171/92   159/83  2. Are you having any other symptoms (ex. Dizziness, headache, blurred vision, passed out)?   No  3. What is your BP issue?   Patient stated her medication was changed but she is having elevated readings.  Patient stated her BP in the morning is usually 125.

## 2022-03-27 NOTE — Telephone Encounter (Addendum)
Was told last night if blood pressure over 90 take 1/2 metoprolol, she did  This am 172/100 HR 90 before am mediations   146/86 HR 86 146/90  HR 88 after taking meds  88 Takes Toprol 50 mg 2 tablets and Maxzide every morning Take Diltiazem 180 mg daily with lunch   Saw PCP and SBP in 160's, about 3 weeks ago Started checking blood pressure couple days ago, didn't feel good Sunday   Has appointment with [place] Mam NP tomorrow  Will forward to Dr Oval Linsey for review

## 2022-03-28 ENCOUNTER — Encounter (HOSPITAL_BASED_OUTPATIENT_CLINIC_OR_DEPARTMENT_OTHER): Payer: Self-pay | Admitting: Family

## 2022-03-28 ENCOUNTER — Ambulatory Visit (HOSPITAL_BASED_OUTPATIENT_CLINIC_OR_DEPARTMENT_OTHER): Payer: Medicare PPO | Admitting: Family

## 2022-03-28 VITALS — BP 182/92 | HR 101 | Ht 64.0 in | Wt 135.0 lb

## 2022-03-28 DIAGNOSIS — D6859 Other primary thrombophilia: Secondary | ICD-10-CM

## 2022-03-28 DIAGNOSIS — E782 Mixed hyperlipidemia: Secondary | ICD-10-CM

## 2022-03-28 DIAGNOSIS — I4819 Other persistent atrial fibrillation: Secondary | ICD-10-CM

## 2022-03-28 DIAGNOSIS — R600 Localized edema: Secondary | ICD-10-CM

## 2022-03-28 DIAGNOSIS — R0989 Other specified symptoms and signs involving the circulatory and respiratory systems: Secondary | ICD-10-CM

## 2022-03-28 MED ORDER — OLMESARTAN MEDOXOMIL 20 MG PO TABS: 10.0000 mg | ORAL_TABLET | Freq: Every day | ORAL | 2 refills | Status: DC

## 2022-03-28 NOTE — Patient Instructions (Addendum)
Medication Instructions:  Your physician has recommended you make the following change in your medication:   START Olmesartan one '10mg'$  table in the evening  *hold if blood pressure is less than 100 for the top number, call and let us know  *If you need a refill on your cardiac medications before your next appointment, please call your pharmacy*  Lab Work: Your physician recommends that you return for lab work in one week for BMP, thyroid panel  Please return for Lab work. You may come to the...   [facility] (3rd floor) [address], [place], [place] [address]  Open: 8am-Noon and 1pm-4:30pm  Please ring the doorbell on the small table when you exit the elevator and the Lab Tech will come get you  [facility] [address] [place], [place], [place] [address] Open: 8am-1pm, then 2pm-4:30pm   [place]- Please see attached locations sheet stapled to your lab work with address and hours.    If you have labs (blood work) drawn today and your tests are completely normal, you will receive your results only by: [place] (if you have MyChart) OR A paper copy in the mail If you have any lab test that is abnormal or we need to change your treatment, we will call you to review the results.   Testing/Procedures: Recommend 24 hour blood pressure monitor. The nurse navigator will reach out to you to schedule. It usually takes about a month to get it scheduled.   Follow-Up: At [facility], you and your health needs are our priority.  As part of our continuing mission to provide you with exceptional heart care, we have created designated Provider Care Teams.  These Care Teams include your primary Cardiologist (physician) and Advanced Practice Providers (APPs -  Physician Assistants and Nurse Practitioners) who all work together to provide you with the care you need, when you need it.  We recommend signing up for the patient  portal called "MyChart".  Sign up information is provided on this After Visit Summary.  MyChart is used to connect with patients for Virtual Visits (Telemedicine).  Patients are able to view lab/test results, encounter notes, upcoming appointments, etc.  Non-urgent messages can be sent to your provider as well.   To learn more about what you can do with MyChart, go to [email or web address].    Your next appointment:   6 week(s)  The format for your next appointment:   In Person  Provider:   Skeet Latch, MD or Laurann Montana, NP    Other Instructions  Tips to Measure your Blood Pressure Correctly  Here's what you can do to ensure a correct reading:  Don't drink a caffeinated beverage or smoke during the 30 minutes before the test.  Sit quietly for five minutes before the test begins.  During the measurement, sit in a chair with your feet on the floor and your arm supported so your elbow is at about heart level.  The inflatable part of the cuff should completely cover at least 80% of your upper arm, and the cuff should be placed on bare skin, not over a shirt.  Don't talk during the measurement.   Blood pressure categories  Blood pressure category SYSTOLIC (upper number)  DIASTOLIC (lower number)  Normal Less than 120 mm Hg and Less than 80 mm Hg  Elevated 120-129 mm Hg and Less than 80 mm Hg  High blood pressure: Stage 1 hypertension 130-139 mm Hg or  80-89 mm Hg  High blood pressure: Stage 2 hypertension 140 mm Hg or higher or 90 mm Hg or higher  Hypertensive crisis (consult your doctor immediately) Higher than 180 mm Hg and/or Higher than 120 mm Hg  Source: American Heart Association and American Stroke Association. For more on getting your blood pressure under control, buy Controlling Your Blood Pressure, a Special Health Report from [facility].   Blood Pressure Log   Date   Time  Blood Pressure  Example: Nov 1 9 AM 124/78                                                Heart Healthy Diet Recommendations: A low-salt diet is recommended. Meats should be grilled, baked, or boiled. Avoid fried foods. Focus on lean protein sources like fish or chicken with vegetables and fruits. The American Heart Association is a Microbiologist!  American Heart Association Diet and Lifeystyle Recommendations   Exercise recommendations: The American Heart Association recommends 150 minutes of moderate intensity exercise weekly. Try 30 minutes of moderate intensity exercise 4-5 times per week. This could include walking, jogging, or swimming.  Important Information About Sugar

## 2022-03-28 NOTE — Progress Notes (Signed)
Office Visit    Patient Name: Robin [name] Date of Encounter: 03/28/2022  PCP:  [name], [place]  Cardiologist:  Skeet Latch, MD  Advanced Practice Provider:  No care team member to display Electrophysiologist:  None   Chief Complaint    Robin [name] is a 87 y.o. female presents today for hypertension  Past Medical History    Past Medical History:  Diagnosis Date   Anxiety    AV bloc first degree    Carotid stenosis 03/02/2017   Hyperlipidemia    Hypertension    Lower extremity edema 12/30/2020   Lumbar pain    fell off of her horse and has low back pain at times   Persistent atrial fibrillation ([place]) 12/30/2020   Venous insufficiency of both lower extremities    Vertigo    Past Surgical History:  Procedure Laterality Date   ABDOMINAL HYSTERECTOMY     BIOPSY  10/28/2020   Procedure: BIOPSY;  Surgeon: Milus Banister, MD;  Location: [facility];  Service: Endoscopy;;   BREAST BIOPSY Left    BREAST EXCISIONAL BIOPSY Right    BREAST LUMPECTOMY WITH RADIOACTIVE SEED LOCALIZATION Left 05/05/2015   Procedure: LEFT BREAST LUMPECTOMY WITH RADIOACTIVE SEED LOCALIZATION;  Surgeon: Autumn Messing III, MD;  Location: [place];  Service: General;  Laterality: Left;   CHOLECYSTECTOMY     COLON RESECTION N/A 10/31/2020   Procedure: LAPAROSCOPIC RIGHT COLON RESECTION, lysis of adhesions, hemicolectomy, bilateral tap block;  Surgeon: [name], MD;  [facility]: [facility];  Service: General;  Laterality: N/A;   COLONOSCOPY WITH PROPOFOL N/A 10/28/2020   Procedure: COLONOSCOPY WITH PROPOFOL;  Surgeon: Milus Banister, MD;  [facility];  Service: Endoscopy;  Laterality: N/A;   EYE SURGERY     bil cataract surgery   IR ANGIO INTRA EXTRACRAN SEL INTERNAL CAROTID BILAT MOD SED  03/22/2021   IR ANGIO VERTEBRAL SEL VERTEBRAL BILAT MOD SED  03/22/2021   LAPAROTOMY N/A 03/17/2021   Procedure: EXPLORATORY  LAPAROTOMY/LYSIS OF ADHESIONS;  Surgeon: [name], MD;  [facility];  Service: General;  Laterality: N/A;   LUNG SURGERY     POLYPECTOMY  10/28/2020   Procedure: POLYPECTOMY;  Surgeon: Milus Banister, MD;  [facility];  Service: Endoscopy;;   RADIOLOGY WITH ANESTHESIA N/A 03/22/2021   Procedure: IR WITH ANESTHESIA;  Surgeon: Radiologist, Medication, MD;  Location: [place];  Service: Radiology;  Laterality: N/A;   SUBMUCOSAL TATTOO INJECTION  10/28/2020   Procedure: SUBMUCOSAL TATTOO INJECTION;  Surgeon: Milus Banister, MD;  [facility];  Service: Endoscopy;;    Allergies  Allergies  Allergen Reactions   Iodinated Contrast Media Shortness Of Breath   Amlodipine     Shortness of breath   Codeine Nausea Only    Tolerates Dilaudid fine   Diphenhydramine Hcl     Hypertension    Pravastatin     Muscle aches   Zetia [Ezetimibe]    Sulfa Antibiotics Rash   Sulfa Drugs Cross Reactors Rash    History of Present Illness    Robin [name] is a 87 y.o. female with a hx of hypertension, paroxysmal atrial fibrillation, hyperlipidemia, mild carotid stenosis  last seen 10/02/2021  She previously underwent robotic proximal colectomy 10/31/2020 for cecal cancer and open lysis of adhesions for small bowel closed-loop obstruction 03/17/2021.  She underwent laparotomy 05/01/2021.  She was seen 07/17/21 for medication management. She was continuing to  recover from fatigue due to COVID-19 06/28/21. BP at home most often <130/80 and HR 60-80 bpm. As her weight had been above 133 lbs Eliquis '5mg'$  dosing was continued.   Had COVID 06/28/21 not requiring admission. Seen 08/07/2021 noting 3-day history of dizziness consistent with vertigo.  Previously intolerant of meclizine.  She was provided handout on Epley maneuver.  Follow-up 10/02/2021 with Dr. Oval Linsey with average reading at home 120s-130s.  She was going to require colonoscopy in a couple months and was recommended for  bridging due to history of CVA while off anticoagulation.  She contacted the office 03/26/2022 noting elevated blood pressure readings 171/92, 159/83. She took an extra Maxide one day at direction of Dr. Oval Linsey. She took and extra Metoprolol another day at direction of on call provider.  She presents today for follow up with her daughter. Tells me she had some sort of virus. Went to her primary care provider who noted her blood pressure was high. Notes the past few weeks hve been a bit stressful with a new dog. Daughter notes Sunday afternoon she wasn't acting right. Notes words were slurring a bit. No missed doses of Eliquis. Denies melena, hematuria. Reports occasional left sided chest discomfort that self resolves after a few moments and does not occur particularly with activity. She attributes it to indigestion. Had not been checking her blood pressure routinely until the past few days. Checking multiple times per day. Notes her diastolic readings have been higher than usual. She checks her blood pressure prior or after medications. She does try to sit and rest 5-10 minutes. Takes Metoprolol and Maxzide in the morning, Diltiazem at lunchtime.   Home BP readings: 143/84, 129/78, 140/90, 157/91, 176/119  EKGs/Labs/Other Studies Reviewed:   The following studies were reviewed today:  EKG:  No EKG today.   Recent Labs: No results found for requested labs within last 365 days.  Recent Lipid Panel    Component Value Date/Time   CHOL 141 03/23/2021 0317   TRIG 98 03/23/2021 0317   HDL 41 03/23/2021 0317   CHOLHDL 3.4 03/23/2021 0317   VLDL 20 03/23/2021 0317   LDLCALC 80 03/23/2021 0317   LDLDIRECT 161.5 06/14/2011 0832   Risk Assessment/Calculations:   CHA2DS2-VASc Score = 4   This indicates a 4.8% annual risk of stroke. The patient's score is based upon: CHF History: 0 HTN History: 1 Diabetes History: 0 Stroke History: 0 Vascular Disease History: 0 Age Score: 2 Gender Score: 1    Home Medications   Current Meds  Medication Sig   acetaminophen (TYLENOL) 325 MG tablet Take 2 tablets (650 mg total) by mouth every 6 (six) hours as needed for mild pain, fever or headache.   apixaban (ELIQUIS) 5 MG TABS tablet Take 1 tablet (5 mg total) by mouth 2 (two) times daily.   Cholecalciferol (VITAMIN D) 50 MCG (2000 UT) tablet Take 2,000 Units by mouth at bedtime.   diltiazem (CARDIZEM CD) 180 MG 24 hr capsule Take 180 mg by mouth daily. With lunch   diphenoxylate-atropine (LOMOTIL) 2.5-0.025 MG tablet Take 1 tablet by mouth as needed.   erythromycin ophthalmic ointment Place into the left eye 3 (three) times daily.   estradiol (ESTRACE) 1 MG tablet Take 0.5 mg by mouth daily.   ferrous sulfate 325 (65 FE) MG tablet Take 1 tablet (325 mg total) by mouth at bedtime.   Lactobacillus Rhamnosus, GG, (CULTURELLE PO) Take 1 capsule by mouth in the morning.   Lutein 20 MG CAPS  Take 1 capsule by mouth every evening.   metoprolol succinate (TOPROL-XL) 50 MG 24 hr tablet Take 2 tablets (100 mg total) by mouth daily.   mometasone (ELOCON) 0.1 % lotion Apply 1 drop topically at bedtime. To forehead   ondansetron (ZOFRAN ODT) 4 MG disintegrating tablet Take 1 tablet (4 mg total) by mouth every 8 (eight) hours as needed for nausea or vomiting.   pantoprazole (PROTONIX) 40 MG tablet Take 1 tablet (40 mg total) by mouth daily.   polycarbophil (FIBERCON) 625 MG tablet Take 1 tablet (625 mg total) by mouth daily.   potassium chloride SA (KLOR-CON M) 20 MEQ tablet TAKE 1 TABLET BY MOUTH TWICE DAILY.   triamcinolone cream (KENALOG) 0.1 % Apply 1 application topically daily as needed (for rash).    triamterene-hydrochlorothiazide (MAXZIDE-25) 37.5-25 MG tablet Take 1 tablet by mouth daily.   Vitamins A & D (VITAMIN A & D) 10000-400 units TABS Take 1 capsule by mouth daily at 12 noon.   [DISCONTINUED] TIADYLT ER 180 MG 24 hr capsule TAKE (1) CAPSULE DAILY.     Review of Systems   All other systems  reviewed and are otherwise negative except as noted above.  Physical Exam    VS:  BP (!) 182/92   Pulse (!) 101   Ht '5\' 4"'$  (1.626 m)   Wt 135 lb (61.2 kg)   BMI 23.17 kg/m  , BMI Body mass index is 23.17 kg/m.  Wt Readings from Last 3 Encounters:  03/28/22 135 lb (61.2 kg)  02/14/22 139 lb 3.2 oz (63.1 kg)  02/12/22 138 lb 3.2 oz (62.7 kg)    GEN: Well nourished, well developed, in no acute distress. HEENT: normal. Neck: Supple, no JVD, carotid bruits, or masses. Cardiac: irregularly irregular, no murmurs, rubs, or gallops. No clubbing, cyanosis, edema.  Radials/PT 2+ and equal bilaterally.  Respiratory:  Respirations regular and unlabored, clear to auscultation bilaterally. GI: Soft, nontender, nondistended. MS: No deformity or atrophy. Skin: Warm and dry, no rash. Neuro:  Strength and sensation are intact. Psych: Normal affect.  Assessment & Plan    Hypertensive heart disease without heart failure - Euvolemic on exam. BP has been elevated. Continue Triamterene-HCTZ, Metoprolol. Start Olmesartan '10mg'$  QHS.BMP/TSH 1 week. Discussed to monitor BP at home at least 2 hours after medications and sitting for 5-10 minutes. 24 hr blood pressure monitor ordered due to labile BP.  Hyperlipidemia- Lipid panel 06/30/21 total cholesterol 166, triglycerides 116, HDL 57, LDL 88. Continue atorvastatin. Denies myalgias.   Persistent atrial fibrillation /chronic anticoagulation - Rate controlled today. Continue Diltiazem and Toprol at present doses.  She is on Eliquis 5 mg twice daily. CHA2DS2-VASc Score = 4 [CHF History: 0, HTN History: 1, Diabetes History: 0, Stroke History: 0, Vascular Disease History: 0, Age Score: 2, Gender Score: 1].  Therefore, the patient's annual risk of stroke is 4.8 %.      Lower extremity edema -stable at baseline.  Continue to elevate lower extremities when sitting.   Disposition: Follow up in 6 weeks with Skeet Latch, MD.   Signed, Loel Dubonnet,  NP 03/28/2022, 11:40 AM [place]

## 2022-03-29 ENCOUNTER — Encounter (HOSPITAL_BASED_OUTPATIENT_CLINIC_OR_DEPARTMENT_OTHER): Payer: Self-pay | Admitting: Family

## 2022-04-04 ENCOUNTER — Telehealth (HOSPITAL_BASED_OUTPATIENT_CLINIC_OR_DEPARTMENT_OTHER): Payer: Self-pay | Admitting: Family

## 2022-04-04 NOTE — Telephone Encounter (Signed)
Follow Up:     Patient called and said her blood pressure have gone back down., SHe wants to know if she still needs to come in this to still get it checked?.   Pt c/o BP issue: STAT if pt c/o blurred vision, one-sided weakness or slurred speech  1. What are your last 5 BP readings? 108/61 pulse-03-29-22-   last night 1116/74 pulse 61 128/77 pulse was 66- yesterday morning  2. Are you having any other symptoms (ex. Dizziness, headache, blurred vision, passed out)?   3. What is your BP issue? Blood pressure is down at this tim

## 2022-04-04 NOTE — Telephone Encounter (Signed)
Appt not until 05/08/22. Recommend she call us 1 week prior to that appointment and if BP still well controlled, can delay to a 3-4 mos f/u.   Loel Dubonnet, NP

## 2022-04-04 NOTE — Telephone Encounter (Signed)
Pt updated and verbalized understanding.  

## 2022-04-09 DIAGNOSIS — R0989 Other specified symptoms and signs involving the circulatory and respiratory systems: Secondary | ICD-10-CM | POA: Diagnosis not present

## 2022-04-10 LAB — BASIC METABOLIC PANEL
BUN/Creatinine Ratio: 26 (ref 12–28)
BUN: 22 mg/dL (ref 8–27)
CO2: 20 mmol/L (ref 20–29)
Calcium: 10.8 mg/dL — ABNORMAL HIGH (ref 8.7–10.3)
Chloride: 99 mmol/L (ref 96–106)
Creatinine, Ser: 0.86 mg/dL (ref 0.57–1.00)
Glucose: 83 mg/dL (ref 70–99)
Potassium: 4.1 mmol/L (ref 3.5–5.2)
Sodium: 139 mmol/L (ref 134–144)
eGFR: 67 mL/min/{1.73_m2} (ref >59)

## 2022-04-10 LAB — THYROID PANEL WITH TSH
Free Thyroxine Index: 2.8 (ref 1.2–4.9)
T3 Uptake Ratio: 29 % (ref 24–39)
T4, Total: 9.7 ug/dL (ref 4.5–12.0)
TSH: 3.3 u[IU]/mL (ref 0.450–4.500)

## 2022-04-26 ENCOUNTER — Other Ambulatory Visit: Payer: Self-pay | Admitting: Cardiovascular Disease

## 2022-04-26 DIAGNOSIS — I4819 Other persistent atrial fibrillation: Secondary | ICD-10-CM

## 2022-04-26 NOTE — Telephone Encounter (Signed)
Please review for refill. Thank you! 

## 2022-04-26 NOTE — Telephone Encounter (Signed)
Eliquis '5mg'$  refill request received. Patient is 87 years old, weight-61.2kg, Crea-0.86 on 04/09/2022, Diagnosis-Afib, and last seen by Laurann Montana on 03/28/2022. Dose is appropriate based on dosing criteria. Will send in refill to requested pharmacy.

## 2022-05-02 ENCOUNTER — Other Ambulatory Visit (HOSPITAL_BASED_OUTPATIENT_CLINIC_OR_DEPARTMENT_OTHER): Payer: Self-pay | Admitting: Family

## 2022-05-02 DIAGNOSIS — R0989 Other specified symptoms and signs involving the circulatory and respiratory systems: Secondary | ICD-10-CM

## 2022-05-02 DIAGNOSIS — E782 Mixed hyperlipidemia: Secondary | ICD-10-CM

## 2022-05-02 DIAGNOSIS — R600 Localized edema: Secondary | ICD-10-CM

## 2022-05-02 DIAGNOSIS — R030 Elevated blood-pressure reading, without diagnosis of hypertension: Secondary | ICD-10-CM

## 2022-05-02 DIAGNOSIS — D6859 Other primary thrombophilia: Secondary | ICD-10-CM

## 2022-05-02 DIAGNOSIS — I4819 Other persistent atrial fibrillation: Secondary | ICD-10-CM

## 2022-05-03 ENCOUNTER — Other Ambulatory Visit: Payer: Self-pay | Admitting: Cardiovascular Disease

## 2022-05-08 ENCOUNTER — Ambulatory Visit (HOSPITAL_BASED_OUTPATIENT_CLINIC_OR_DEPARTMENT_OTHER): Payer: Medicare PPO | Admitting: Family

## 2022-05-16 ENCOUNTER — Ambulatory Visit: Payer: Medicare PPO

## 2022-06-08 DIAGNOSIS — H401132 Primary open-angle glaucoma, bilateral, moderate stage: Secondary | ICD-10-CM | POA: Diagnosis not present

## 2022-06-13 ENCOUNTER — Ambulatory Visit (HOSPITAL_BASED_OUTPATIENT_CLINIC_OR_DEPARTMENT_OTHER): Payer: Medicare PPO | Admitting: Family

## 2022-06-13 ENCOUNTER — Encounter (HOSPITAL_BASED_OUTPATIENT_CLINIC_OR_DEPARTMENT_OTHER): Payer: Self-pay | Admitting: Family

## 2022-06-13 VITALS — BP 126/63 | HR 72 | Ht 64.0 in | Wt 138.5 lb

## 2022-06-13 DIAGNOSIS — I4819 Other persistent atrial fibrillation: Secondary | ICD-10-CM | POA: Diagnosis not present

## 2022-06-13 DIAGNOSIS — E782 Mixed hyperlipidemia: Secondary | ICD-10-CM

## 2022-06-13 DIAGNOSIS — D6859 Other primary thrombophilia: Secondary | ICD-10-CM | POA: Diagnosis not present

## 2022-06-13 DIAGNOSIS — I119 Hypertensive heart disease without heart failure: Secondary | ICD-10-CM

## 2022-06-13 DIAGNOSIS — R0989 Other specified symptoms and signs involving the circulatory and respiratory systems: Secondary | ICD-10-CM | POA: Diagnosis not present

## 2022-06-13 NOTE — Progress Notes (Signed)
Office Visit    Patient Name: Robin [name] Date of Encounter: 06/13/2022  PCP:  [name], [place]  Cardiologist:  Skeet Latch, MD  Advanced Practice Provider:  No care team member to display Electrophysiologist:  None   Chief Complaint    Robin [name] is a 87 y.o. female presents today for hypertension  Past Medical History    Past Medical History:  Diagnosis Date   Anxiety    AV bloc first degree    Carotid stenosis 03/02/2017   Hyperlipidemia    Hypertension    Lower extremity edema 12/30/2020   Lumbar pain    fell off of her horse and has low back pain at times   Persistent atrial fibrillation ([place]) 12/30/2020   Venous insufficiency of both lower extremities    Vertigo    Past Surgical History:  Procedure Laterality Date   ABDOMINAL HYSTERECTOMY     BIOPSY  10/28/2020   Procedure: BIOPSY;  Surgeon: Milus Banister, MD;  [facility];  Service: Endoscopy;;   BREAST BIOPSY Left    BREAST EXCISIONAL BIOPSY Right    BREAST LUMPECTOMY WITH RADIOACTIVE SEED LOCALIZATION Left 05/05/2015   Procedure: LEFT BREAST LUMPECTOMY WITH RADIOACTIVE SEED LOCALIZATION;  Surgeon: Autumn Messing III, MD;  Location: [place];  Service: General;  Laterality: Left;   CHOLECYSTECTOMY     COLON RESECTION N/A 10/31/2020   Procedure: LAPAROSCOPIC RIGHT COLON RESECTION, lysis of adhesions, hemicolectomy, bilateral tap block;  Surgeon: [name], MD;  [facility];  Service: General;  Laterality: N/A;   COLONOSCOPY WITH PROPOFOL N/A 10/28/2020   Procedure: COLONOSCOPY WITH PROPOFOL;  Surgeon: Milus Banister, MD;  [facility];  Service: Endoscopy;  Laterality: N/A;   EYE SURGERY     bil cataract surgery   IR ANGIO INTRA EXTRACRAN SEL INTERNAL CAROTID BILAT MOD SED  03/22/2021   IR ANGIO VERTEBRAL SEL VERTEBRAL BILAT MOD SED  03/22/2021   LAPAROTOMY N/A 03/17/2021   Procedure: EXPLORATORY  LAPAROTOMY/LYSIS OF ADHESIONS;  Surgeon: [name], MD;  [facility];  Service: General;  Laterality: N/A;   LUNG SURGERY     POLYPECTOMY  10/28/2020   Procedure: POLYPECTOMY;  Surgeon: Milus Banister, MD;  [facility];  Service: Endoscopy;;   RADIOLOGY WITH ANESTHESIA N/A 03/22/2021   Procedure: IR WITH ANESTHESIA;  Surgeon: Radiologist, Medication, MD;  Location: [place];  Service: Radiology;  Laterality: N/A;   SUBMUCOSAL TATTOO INJECTION  10/28/2020   Procedure: SUBMUCOSAL TATTOO INJECTION;  Surgeon: Milus Banister, MD;  [facility];  Service: Endoscopy;;    Allergies  Allergies  Allergen Reactions   Iodinated Contrast Media Shortness Of Breath   Amlodipine     Shortness of breath   Codeine Nausea Only    Tolerates Dilaudid fine   Diphenhydramine Hcl     Hypertension    Pravastatin     Muscle aches   Zetia [Ezetimibe]    Sulfa Antibiotics Rash   Sulfa Drugs Cross Reactors Rash    History of Present Illness    Robin [name] is a 87 y.o. female with a hx of hypertension, paroxysmal atrial fibrillation, hyperlipidemia, mild carotid stenosis  last seen 10/02/2021  She previously underwent robotic proximal colectomy 10/31/2020 for cecal cancer and open lysis of adhesions for small bowel closed-loop obstruction 03/17/2021.  She underwent laparotomy 05/01/2021.  She was seen 07/17/21 for medication management. She was continuing to  recover from fatigue due to COVID-19 06/28/21. BP at home most often <130/80 and HR 60-80 bpm. As her weight had been above 133 lbs Eliquis '5mg'$  dosing was continued.   Had COVID 06/28/21 not requiring admission. Seen 08/07/2021 noting 3-day history of dizziness consistent with vertigo.  Previously intolerant of meclizine.  She was provided handout on Epley maneuver.  Follow-up 10/02/2021 with Dr. Oval Linsey with average reading at home 120s-130s.  She was going to require colonoscopy in a couple months and was recommended for  bridging due to history of CVA while off anticoagulation.  She contacted the office 03/26/2022 noting elevated blood pressure readings 171/92, 159/83. She took an extra Maxide one day at direction of Dr. Oval Linsey. She took and extra Metoprolol another day at direction of on call provider.  At her last visit 03/28/22 she was started on Olmesartan '10mg'$  QHS.  However her blood pressure in proved prior to starting medication she opted not to start.  She was ordered for a 24 hour blood pressure monitor but opted not to pursue as her blood pressure was better controlled.   She presents today for follow up independently. Enjoyed Christmas brunch with her daughter and grandchildren. Tells me she had a mechanical fall 2 weeks ago as she missed a grab handle walking into her house from her garage. No lightheadedness, syncope, nor significant injury. She saw eye doctor recently who started her on steroid eye drops which she reported caused dizziness with new drops started which are working. Her blood pressure at home has been well controlled. She has a dog at home who keeps her active and she thinks has helped her blood pressure. Will have rare escalated reading which improves with rest.. Takes Metoprolol and Maxzide in the morning, Diltiazem at lunchtime.   Home BP readings: 112/63 (HR 58), 126/63 (HR 81)  EKGs/Labs/Other Studies Reviewed:   The following studies were reviewed today:  EKG:  EKG ordered today. EKG performed today demonstrates rate controlled atrial fibrillation 72 bpm.   Recent Labs: 04/09/2022: BUN 22; Creatinine, Ser 0.86; Potassium 4.1; Sodium 139; TSH 3.300  Recent Lipid Panel    Component Value Date/Time   CHOL 141 03/23/2021 0317   TRIG 98 03/23/2021 0317   HDL 41 03/23/2021 0317   CHOLHDL 3.4 03/23/2021 0317   VLDL 20 03/23/2021 0317   LDLCALC 80 03/23/2021 0317   LDLDIRECT 161.5 06/14/2011 0832   Risk Assessment/Calculations:   CHA2DS2-VASc Score = 4   This indicates a  4.8% annual risk of stroke. The patient's score is based upon: CHF History: 0 HTN History: 1 Diabetes History: 0 Stroke History: 0 Vascular Disease History: 0 Age Score: 2 Gender Score: 1   Home Medications   Current Meds  Medication Sig   acetaminophen (TYLENOL) 325 MG tablet Take 2 tablets (650 mg total) by mouth every 6 (six) hours as needed for mild pain, fever or headache.   apixaban (ELIQUIS) 5 MG TABS tablet TAKE 1 TABLET TWICE DAILY   Cholecalciferol (VITAMIN D) 50 MCG (2000 UT) tablet Take 2,000 Units by mouth at bedtime.   diltiazem (CARDIZEM CD) 180 MG 24 hr capsule Take 180 mg by mouth daily. With lunch   diphenoxylate-atropine (LOMOTIL) 2.5-0.025 MG tablet Take 1 tablet by mouth as needed.   estradiol (ESTRACE) 1 MG tablet Take 0.5 mg by mouth daily.   ferrous sulfate 325 (65 FE) MG tablet Take 1 tablet (325 mg total) by mouth at bedtime.   Lactobacillus Rhamnosus, GG, (CULTURELLE PO) Take  1 capsule by mouth in the morning.   Lutein 20 MG CAPS Take 1 capsule by mouth every evening.   metoprolol succinate (TOPROL-XL) 50 MG 24 hr tablet Take 2 tablets (100 mg total) by mouth daily.   mometasone (ELOCON) 0.1 % lotion Apply 1 drop topically at bedtime. To forehead   ondansetron (ZOFRAN ODT) 4 MG disintegrating tablet Take 1 tablet (4 mg total) by mouth every 8 (eight) hours as needed for nausea or vomiting.   pantoprazole (PROTONIX) 40 MG tablet Take 1 tablet (40 mg total) by mouth daily.   polycarbophil (FIBERCON) 625 MG tablet Take 1 tablet (625 mg total) by mouth daily.   potassium chloride SA (KLOR-CON M) 20 MEQ tablet TAKE 1 TABLET BY MOUTH TWICE DAILY.   TIADYLT ER 180 MG 24 hr capsule TAKE ONE CAPSULE DAILY   triamcinolone cream (KENALOG) 0.1 % Apply 1 application topically daily as needed (for rash).    triamterene-hydrochlorothiazide (MAXZIDE-25) 37.5-25 MG tablet Take 1 tablet by mouth daily.     Review of Systems   All other systems reviewed and are otherwise  negative except as noted above.  Physical Exam    VS:  BP 126/63 Comment: home BP  Pulse 72   Ht '5\' 4"'$  (1.626 m)   Wt 138 lb 8 oz (62.8 kg)   SpO2 100%   BMI 23.77 kg/m  , BMI Body mass index is 23.77 kg/m.  Wt Readings from Last 3 Encounters:  06/13/22 138 lb 8 oz (62.8 kg)  03/28/22 135 lb (61.2 kg)  02/14/22 139 lb 3.2 oz (63.1 kg)    GEN: Well nourished, well developed, in no acute distress. HEENT: normal. Neck: Supple, no JVD, carotid bruits, or masses. Cardiac: irregularly irregular, no murmurs, rubs, or gallops. No clubbing, cyanosis, edema.  Radials/PT 2+ and equal bilaterally.  Respiratory:  Respirations regular and unlabored, clear to auscultation bilaterally. GI: Soft, nontender, nondistended. MS: No deformity or atrophy. Skin: Warm and dry, no rash. Neuro:  Strength and sensation are intact. Psych: Normal affect.  Assessment & Plan    Hypertensive heart disease without heart failure - Known white coat hypertension. Euvolemic on exam. BP has been controlled at home. She did not have to start Olmesartan and will remain off. Continue Triamterene-HCTZ, Metoprolol. Discussed to monitor BP at home at least 2 hours after medications and sitting for 5-10 minutes.   Hyperlipidemia- Lipid panel 06/30/21 total cholesterol 166, triglycerides 116, HDL 57, LDL 88. Continue atorvastatin. Denies myalgias.   Persistent atrial fibrillation /chronic anticoagulation - Rate controlled today. Continue Diltiazem and Toprol at present doses.  She is on Eliquis 5 mg twice daily. Does not meet dose reduction criteria. CHA2DS2-VASc Score = 4 [CHF History: 0, HTN History: 1, Diabetes History: 0, Stroke History: 0, Vascular Disease History: 0, Age Score: 2, Gender Score: 1].  Therefore, the patient's annual risk of stroke is 4.8 %.      Lower extremity edema - Stable at baseline.  Continue to elevate lower extremities when sitting.   Disposition: Follow up in 6 months with Skeet Latch, MD.    Signed, Loel Dubonnet, NP 06/13/2022, 12:51 PM [place]

## 2022-06-13 NOTE — Patient Instructions (Addendum)
Medication Instructions:  Continue your current medications.   *If you need a refill on your cardiac medications before your next appointment, please call your pharmacy*   Lab Work/Testing/Procedures: None ordered today.   Follow-Up: At [facility], you and your health needs are our priority.  As part of our continuing mission to provide you with exceptional heart care, we have created designated Provider Care Teams.  These Care Teams include your primary Cardiologist (physician) and Advanced Practice Providers (APPs -  Physician Assistants and Nurse Practitioners) who all work together to provide you with the care you need, when you need it.  We recommend signing up for the patient portal called "MyChart".  Sign up information is provided on this After Visit Summary.  MyChart is used to connect with patients for Virtual Visits (Telemedicine).  Patients are able to view lab/test results, encounter notes, upcoming appointments, etc.  Non-urgent messages can be sent to your provider as well.   To learn more about what you can do with MyChart, go to [email or web address].    Your next appointment:   6 month(s)  The format for your next appointment:   In Person  Provider:   Skeet Latch, MD or Laurann Montana, NP    Other Instructions  Remain off Olmesartan  If your blood pressure is consistently more than 130/80 by your home readings please call and let us know.

## 2022-06-30 ENCOUNTER — Other Ambulatory Visit (HOSPITAL_BASED_OUTPATIENT_CLINIC_OR_DEPARTMENT_OTHER): Payer: Self-pay | Admitting: Family

## 2022-06-30 DIAGNOSIS — I4819 Other persistent atrial fibrillation: Secondary | ICD-10-CM

## 2022-07-02 NOTE — Telephone Encounter (Signed)
Rx request sent to pharmacy.  

## 2022-07-20 DIAGNOSIS — E559 Vitamin D deficiency, unspecified: Secondary | ICD-10-CM | POA: Diagnosis not present

## 2022-07-20 DIAGNOSIS — Z23 Encounter for immunization: Secondary | ICD-10-CM | POA: Diagnosis not present

## 2022-07-20 DIAGNOSIS — I10 Essential (primary) hypertension: Secondary | ICD-10-CM | POA: Diagnosis not present

## 2022-07-20 DIAGNOSIS — Z0000 Encounter for general adult medical examination without abnormal findings: Secondary | ICD-10-CM | POA: Diagnosis not present

## 2022-07-20 DIAGNOSIS — Z6823 Body mass index (BMI) 23.0-23.9, adult: Secondary | ICD-10-CM | POA: Diagnosis not present

## 2022-07-20 DIAGNOSIS — I482 Chronic atrial fibrillation, unspecified: Secondary | ICD-10-CM | POA: Diagnosis not present

## 2022-07-20 DIAGNOSIS — E7800 Pure hypercholesterolemia, unspecified: Secondary | ICD-10-CM | POA: Diagnosis not present

## 2022-07-20 DIAGNOSIS — I700 Atherosclerosis of aorta: Secondary | ICD-10-CM | POA: Diagnosis not present

## 2022-08-06 ENCOUNTER — Encounter (HOSPITAL_BASED_OUTPATIENT_CLINIC_OR_DEPARTMENT_OTHER): Payer: Self-pay

## 2022-08-06 DIAGNOSIS — N959 Unspecified menopausal and perimenopausal disorder: Secondary | ICD-10-CM | POA: Diagnosis not present

## 2022-09-12 DIAGNOSIS — L814 Other melanin hyperpigmentation: Secondary | ICD-10-CM | POA: Diagnosis not present

## 2022-09-12 DIAGNOSIS — C44722 Squamous cell carcinoma of skin of right lower limb, including hip: Secondary | ICD-10-CM | POA: Diagnosis not present

## 2022-09-12 DIAGNOSIS — R208 Other disturbances of skin sensation: Secondary | ICD-10-CM | POA: Diagnosis not present

## 2022-09-12 DIAGNOSIS — D1801 Hemangioma of skin and subcutaneous tissue: Secondary | ICD-10-CM | POA: Diagnosis not present

## 2022-09-12 DIAGNOSIS — L298 Other pruritus: Secondary | ICD-10-CM | POA: Diagnosis not present

## 2022-09-12 DIAGNOSIS — Z789 Other specified health status: Secondary | ICD-10-CM | POA: Diagnosis not present

## 2022-09-12 DIAGNOSIS — L821 Other seborrheic keratosis: Secondary | ICD-10-CM | POA: Diagnosis not present

## 2022-09-12 DIAGNOSIS — L820 Inflamed seborrheic keratosis: Secondary | ICD-10-CM | POA: Diagnosis not present

## 2022-09-12 DIAGNOSIS — C44729 Squamous cell carcinoma of skin of left lower limb, including hip: Secondary | ICD-10-CM | POA: Diagnosis not present

## 2022-09-12 DIAGNOSIS — L538 Other specified erythematous conditions: Secondary | ICD-10-CM | POA: Diagnosis not present

## 2022-09-12 DIAGNOSIS — D045 Carcinoma in situ of skin of trunk: Secondary | ICD-10-CM | POA: Diagnosis not present

## 2022-09-17 DIAGNOSIS — Z48817 Encounter for surgical aftercare following surgery on the skin and subcutaneous tissue: Secondary | ICD-10-CM | POA: Diagnosis not present

## 2022-09-26 IMAGING — DX DG ABD PORTABLE 1V
2 series · 2 of 2 positions shown · non-contrast
Comparison: 11/11/2020

CLINICAL DATA: Cecal cancer with hemicolectomy 10/31/2020.  Nausea.

EXAM:
PORTABLE ABDOMEN - 1 VIEW

[abdomen kub (1 of 2)]
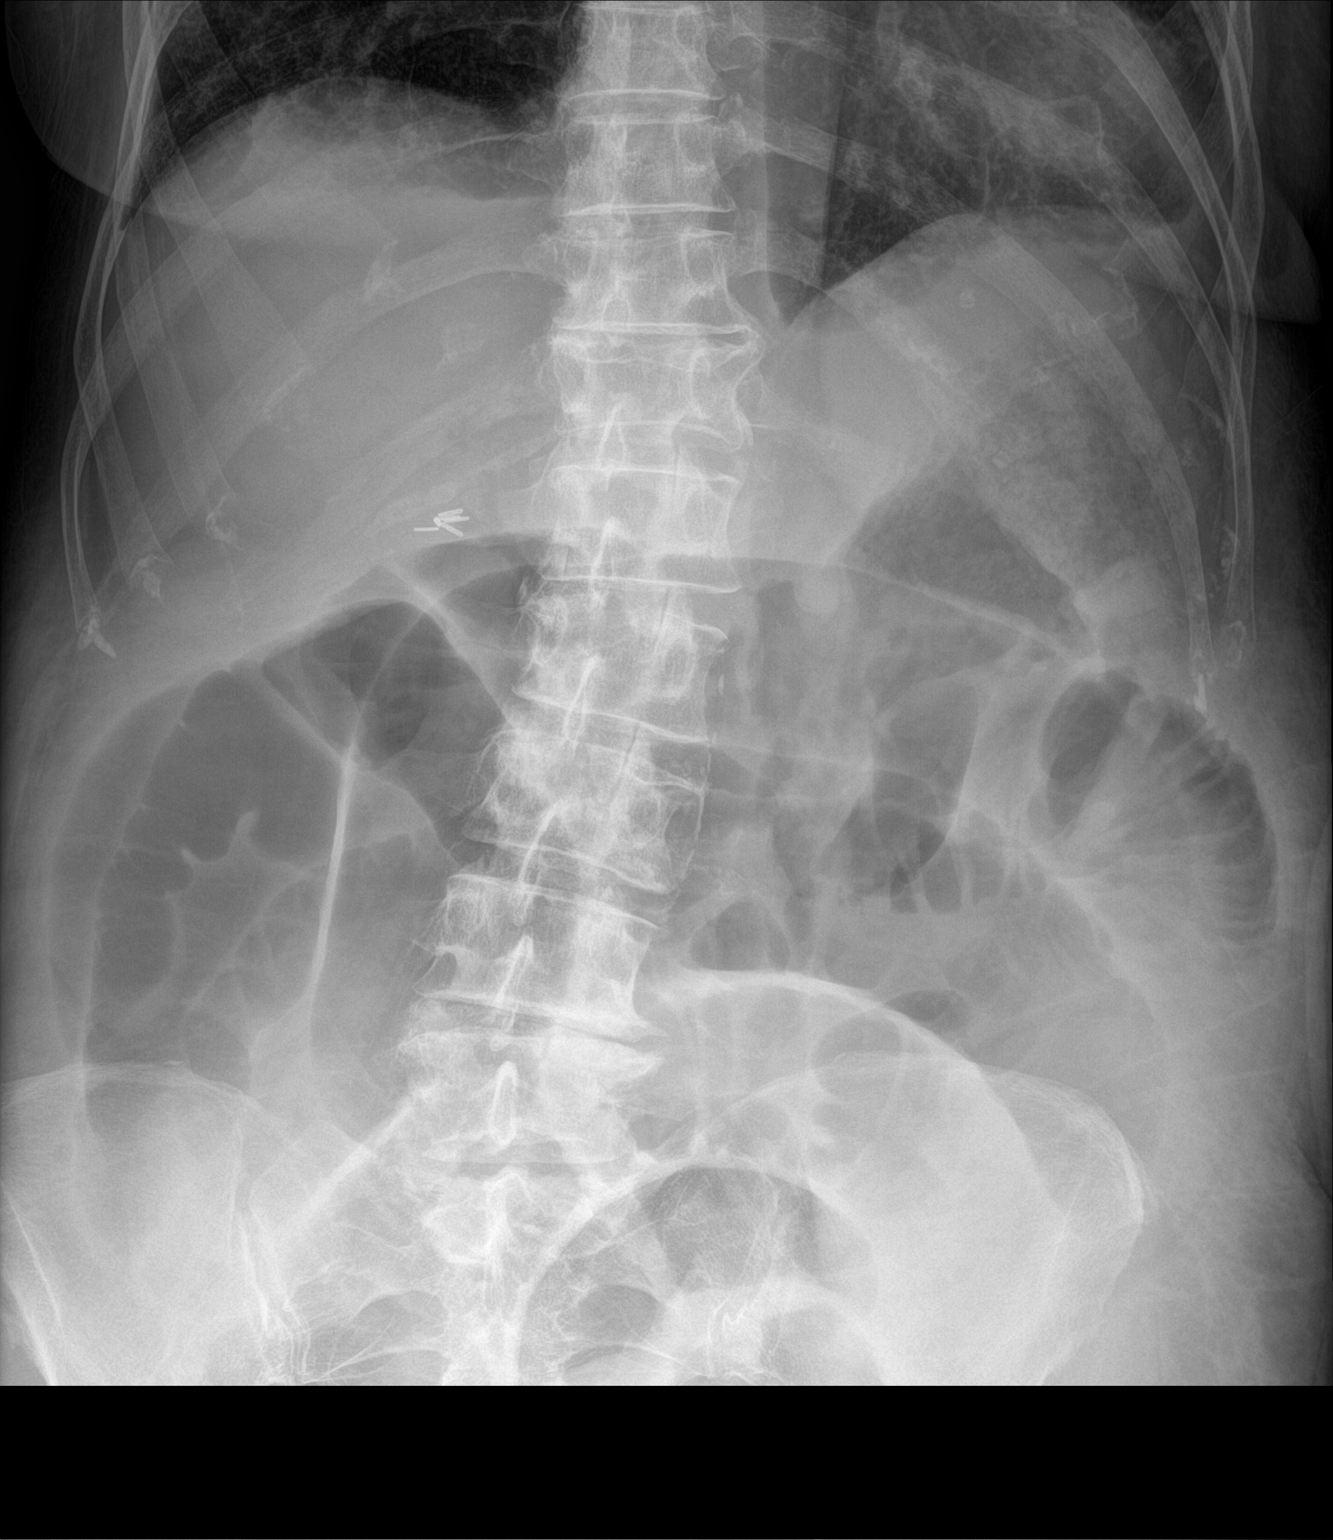

[abdomen kub (2 of 2)]
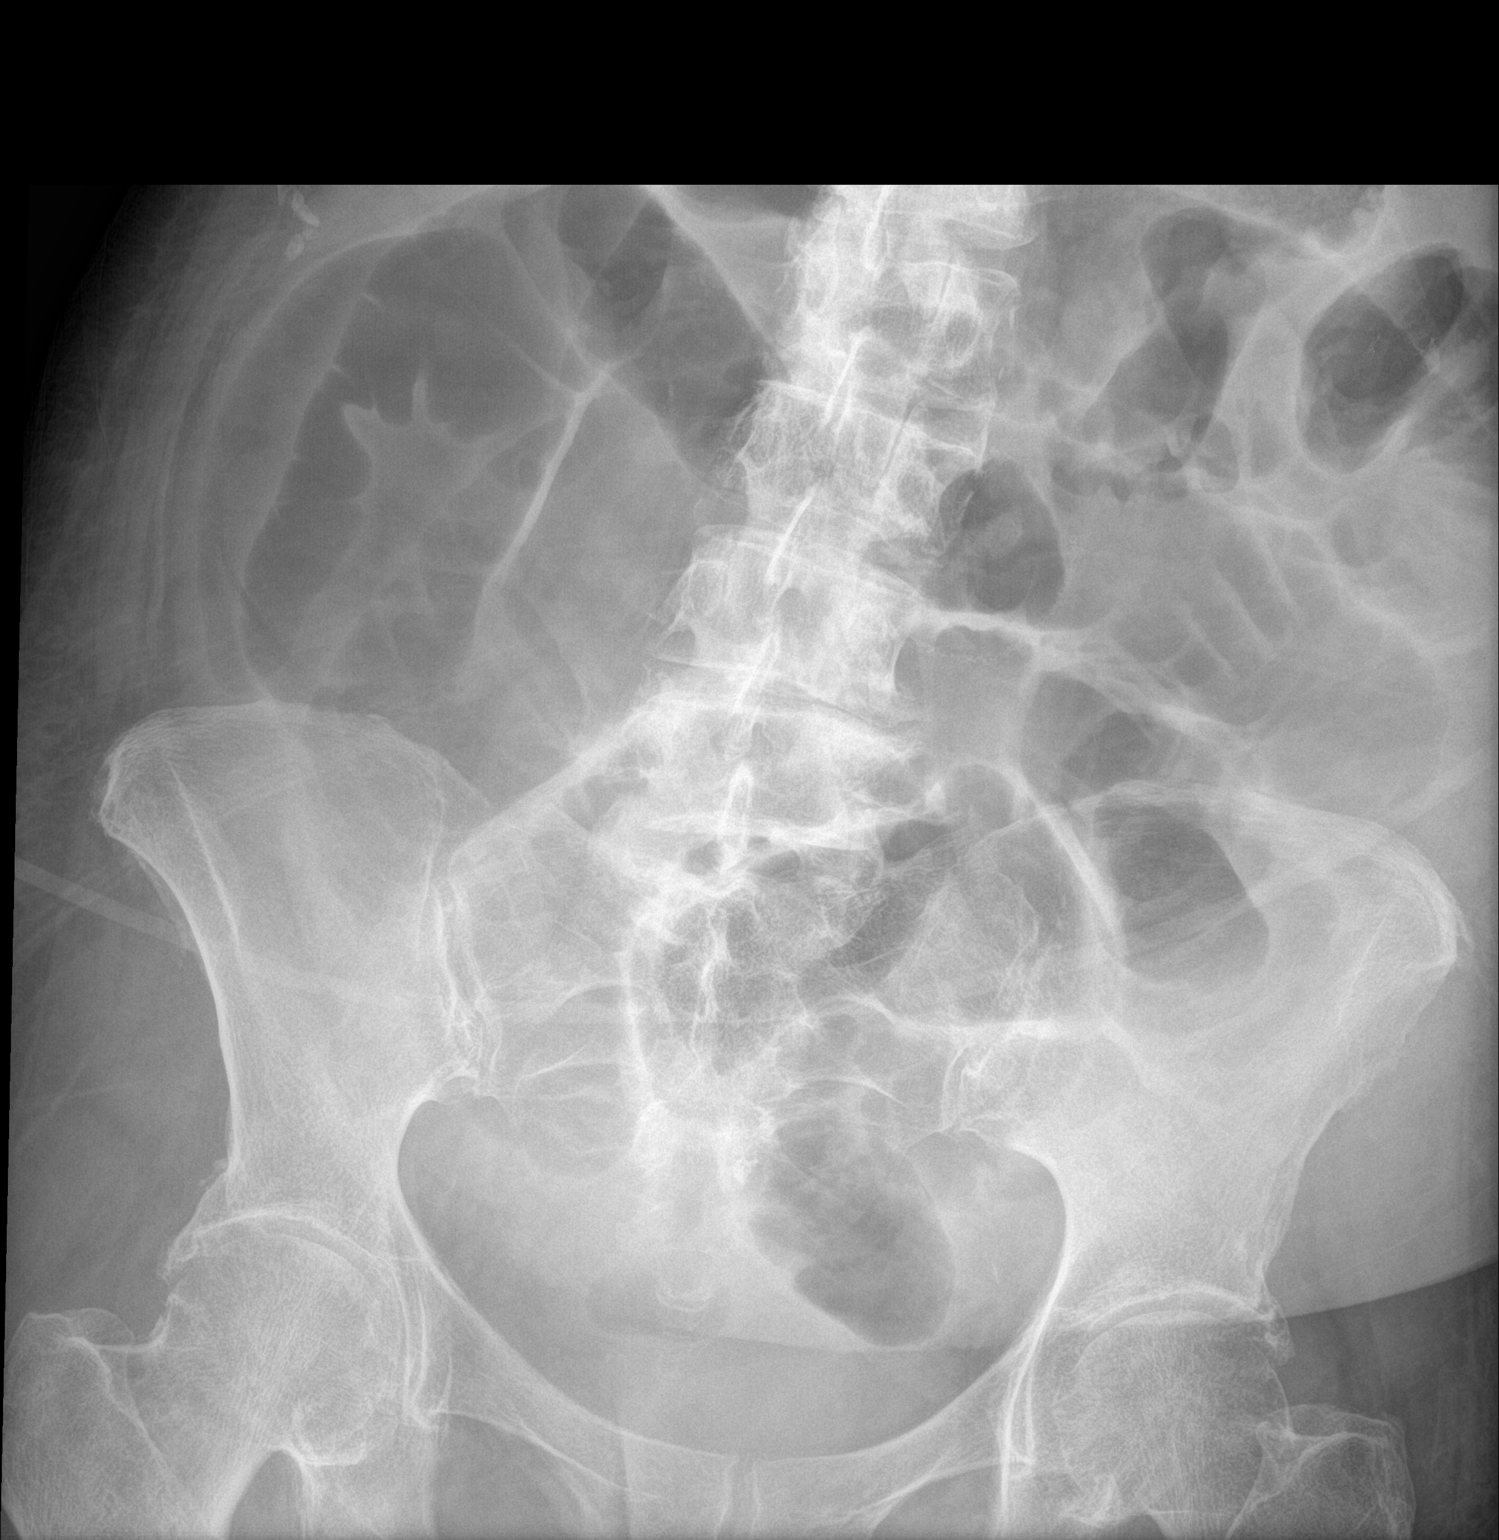

[2 of 2 positions shown; findings below may reference images not displayed]

FINDINGS: NG tube removed. Mildly distended bowel loops predominately small
bowel. Upright view not obtained evaluate for free air

Surgical clips in the gallbladder fossa. Lumbar scoliosis and disc
degeneration. Mild atherosclerotic aorta.
IMPRESSION: Mildly distended small bowel loops may represent small-bowel
obstruction or ileus.

## 2022-10-01 IMAGING — CT CT ABD-PELV W/O CM
2 of 4 series · 15 of 46 positions shown, 17 images · non-contrast
Comparison: Postoperative CT 11/10/2020. Preoperative CT 10/28/2020

CLINICAL DATA: Bowel obstruction suspected. Ileus. Laparoscopic
hemicolectomy 10/31/2020.

EXAM:
CT ABDOMEN AND PELVIS WITHOUT CONTRAST
TECHNIQUE: Multidetector CT imaging of the abdomen and pelvis was performed
following the standard protocol without IV contrast.

[Series 2: axial st · axial · 0.88mm/px · z∈[-670,-290]mm · 12 of 86 slices shown, 14 images]
[im 5/86  soft-tissue]
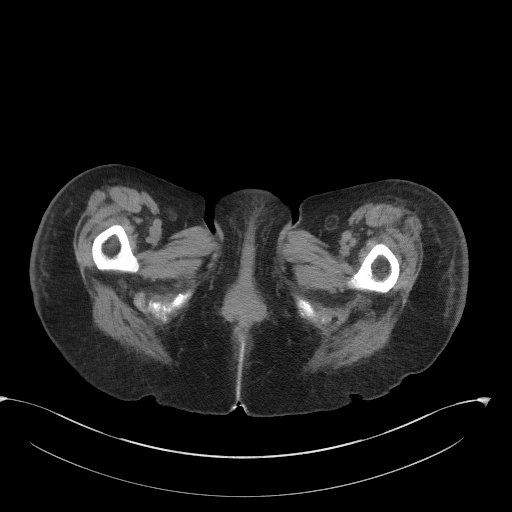
[im 5/86  bone]
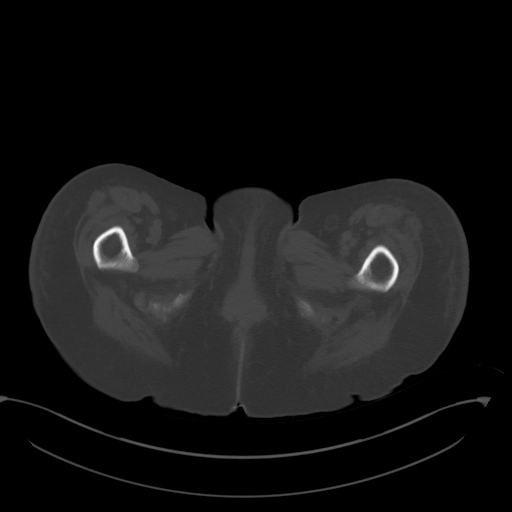
[im 13/86  soft-tissue]
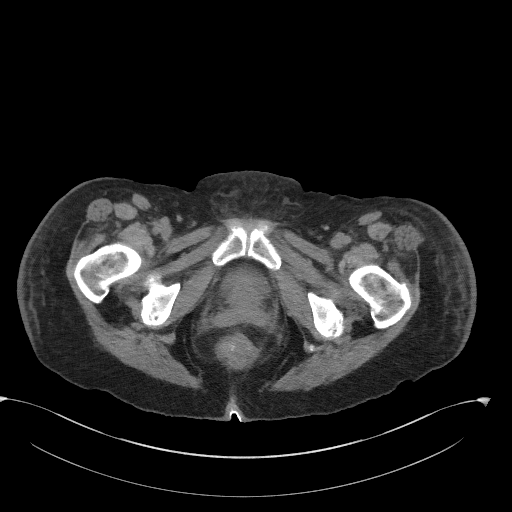
[im 18/86  soft-tissue]
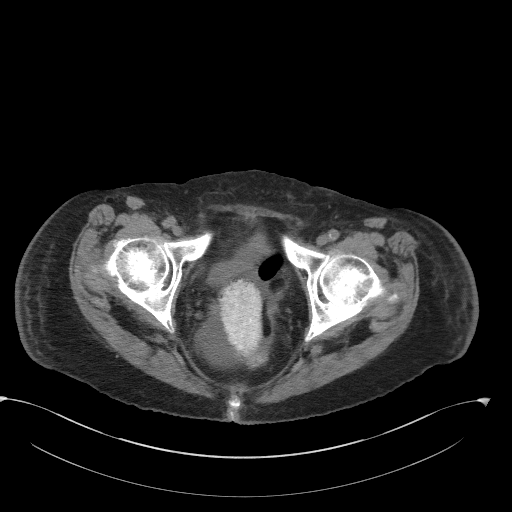
[im 26/86  soft-tissue]
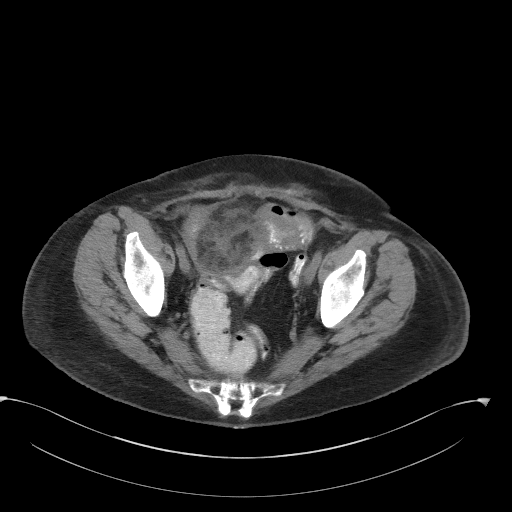
[im 35/86  soft-tissue]
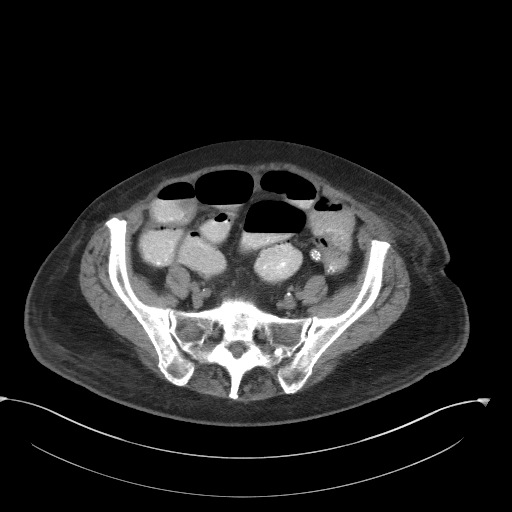
[im 39/86  soft-tissue]
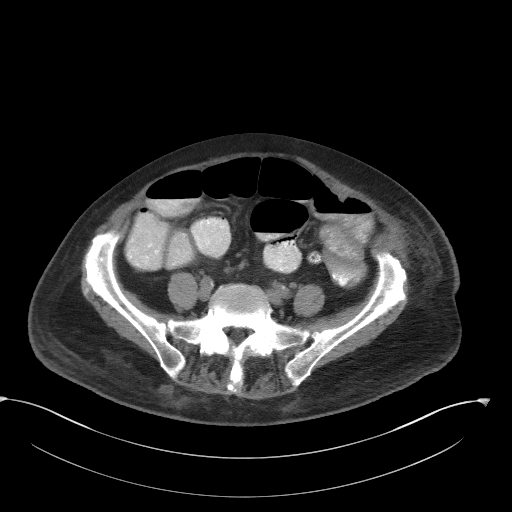
[im 47/86  soft-tissue]
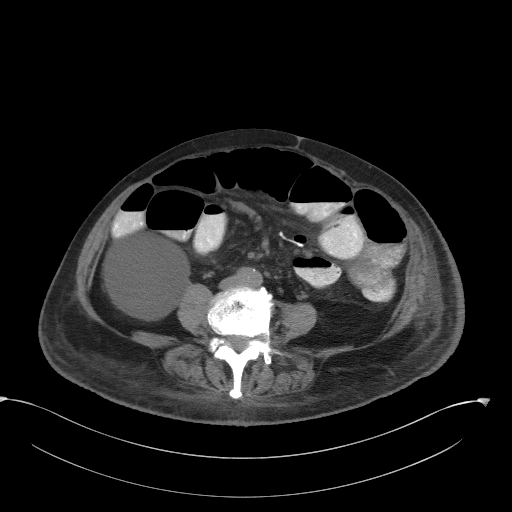
[im 52/86  soft-tissue]
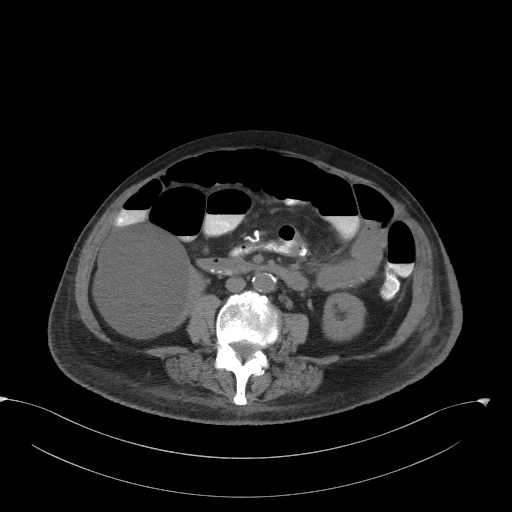
[im 60/86  soft-tissue]
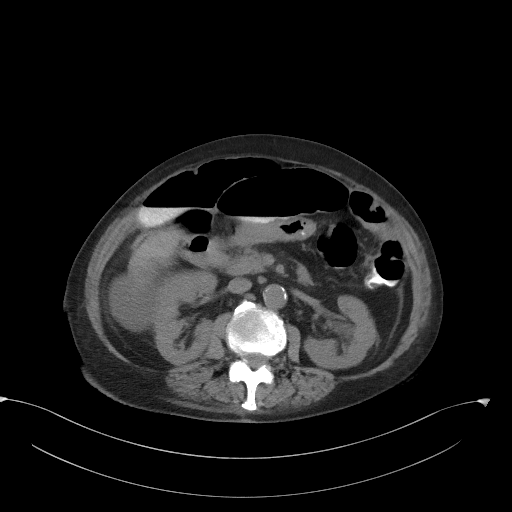
[im 60/86  bone]
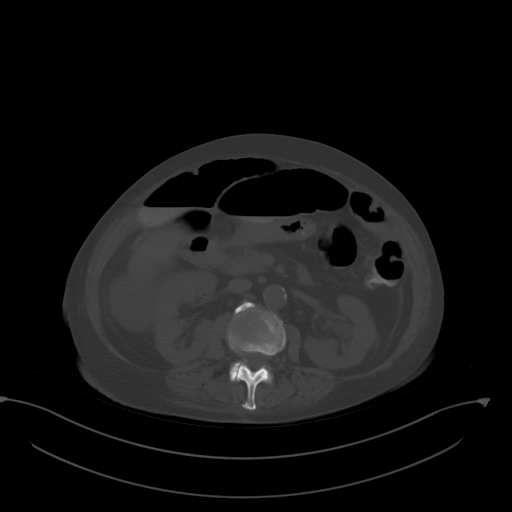
[im 69/86  soft-tissue]
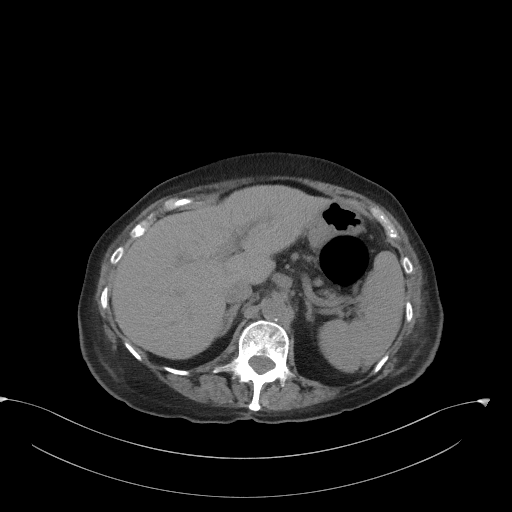
[im 73/86  soft-tissue]
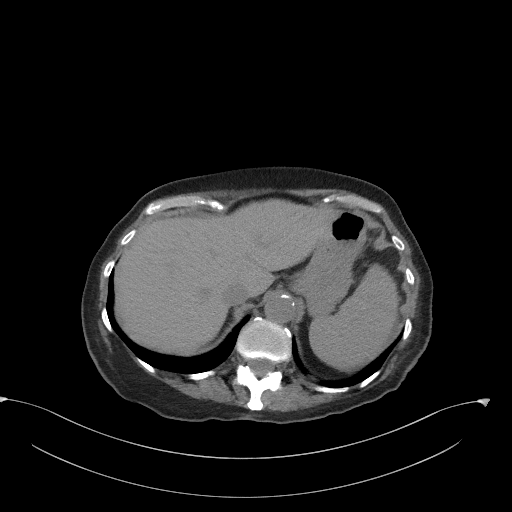
[im 81/86  soft-tissue]
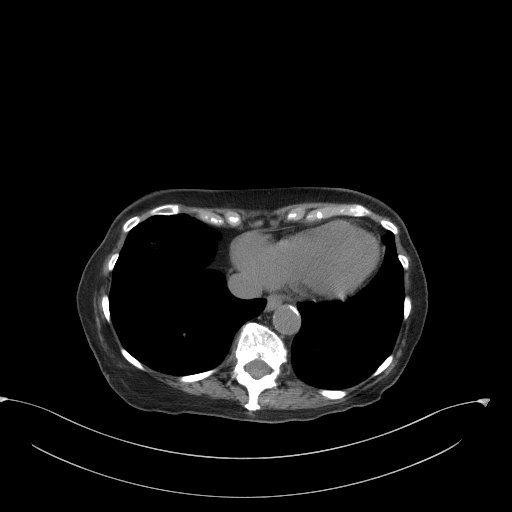

[Series 4: coronal st · coronal · 0.76mm/px · 3 of 91 slices shown]
[im 31/91  soft-tissue]
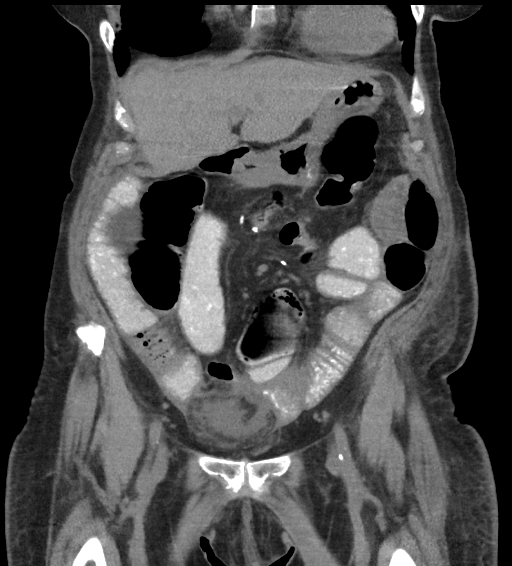
[im 41/91  soft-tissue]
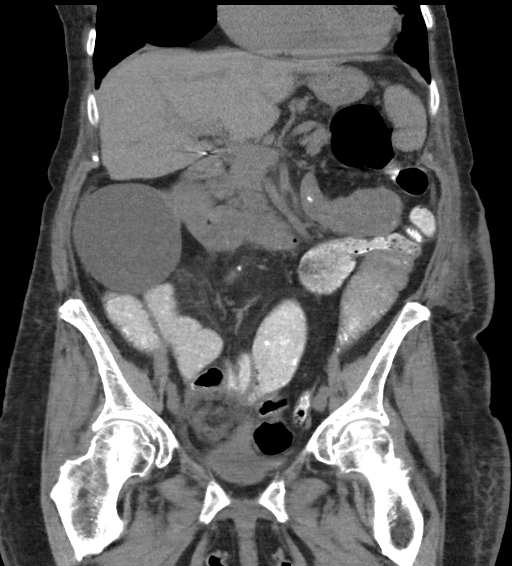
[im 51/91  soft-tissue]
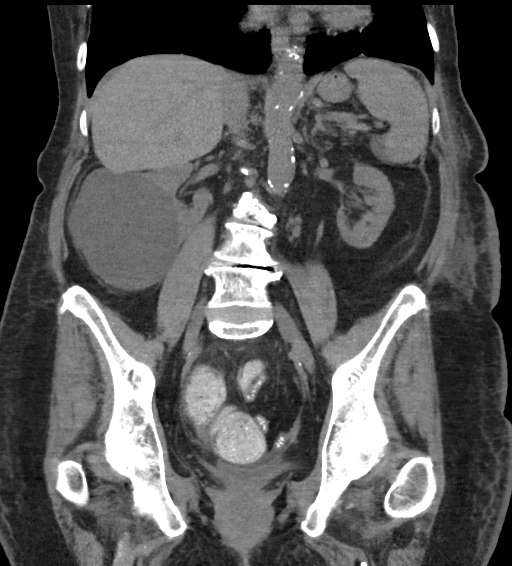

[15 of 46 positions shown; findings below may reference images not displayed]

FINDINGS: Lower chest: Resolved left basilar opacity from prior exam. Similar
appearing reticulation and nodularity in the anterior right lower
and right middle lobes appear to represent scarring.

Hepatobiliary: No evidence of focal liver abnormality on this
noncontrast exam. Previous portal venous gas has resolved.
Cholecystectomy. No definite biliary dilatation, common bile duct
not well visualized.

Pancreas: Mild parenchymal atrophy. No ductal dilatation or
inflammation.

Spleen: Normal in size without focal abnormality.

Adrenals/Urinary Tract: No adrenal nodule. No hydronephrosis or
perinephric edema. Left peripelvic and right renal cortical cyst.
The urinary bladder is near completely empty and not well assessed.

Stomach/Bowel: No abnormal gastric distension. Normal positioning of
the duodenum and ligament of Treitz. Status post right hemicolectomy
with enteric sutures in the mid transverse colon. Small bowel loops
are diffusely dilated and contrast filled, however enteric contrast
reaches the rectosigmoid colon, no obstruction occasional areas of
small bowel thickening, including in the right lower quadrant
adjacent to an area of presumed fat necrosis, for example coronal
series 4, image 34. the previous small bowel pneumatosis is no
longer seen.

Vascular/Lymphatic: Aortic atherosclerosis. No aortic aneurysm.
There is no portal venous or mesenteric gas. No bulky abdominopelvic
adenopathy.

Reproductive: Uterus is surgically absent. There is no adnexal mass.

Other: There is a rounded area of fatty inflammation in the pelvis
just to the right of midline with small amount of free fluid
suspicious for fat necrosis. This spans approximately 6.2 cm, series
2, image 64. This is more coalescent than seen on prior exam. There
is a small volume of free fluid in the posterior pelvis but no
loculated collection. No free air. Minimal subcutaneous edema.

Musculoskeletal: Chronic T12, L2 and minimal L3 superior endplate
compression fractures. Multilevel degenerative change in the spine.
No acute osseous abnormalities are seen.
IMPRESSION: 1. Post right hemicolectomy with enteric sutures in the mid
transverse colon. Small bowel is dilated, however no evidence of
obstruction with enteric contrast reaching the rectosigmoid colon.
Previous small bowel pneumatosis and portal venous gas has resolved.
No evidence of bowel obstruction.
2. Occasional areas of small bowel thickening in the pelvis adjacent
to presumed fat necrosis described below.
3. A rounded area of fatty inflammation in the pelvis just to the
right of midline with small amount of free fluid suspicious for fat
necrosis. This is more coalescent than on prior exam.
4. Small volume of free fluid in the posterior pelvis but no
loculated collection/abscess. No free air.

Aortic Atherosclerosis ([id number].O).

## 2022-10-01 IMAGING — DX DG ABDOMEN 2V
2 series · 2 of 2 positions shown · non-contrast
Comparison: November 18, 2020

CLINICAL DATA: Small-bowel obstruction

EXAM:
ABDOMEN - 2 VIEW

[abdomen erect]
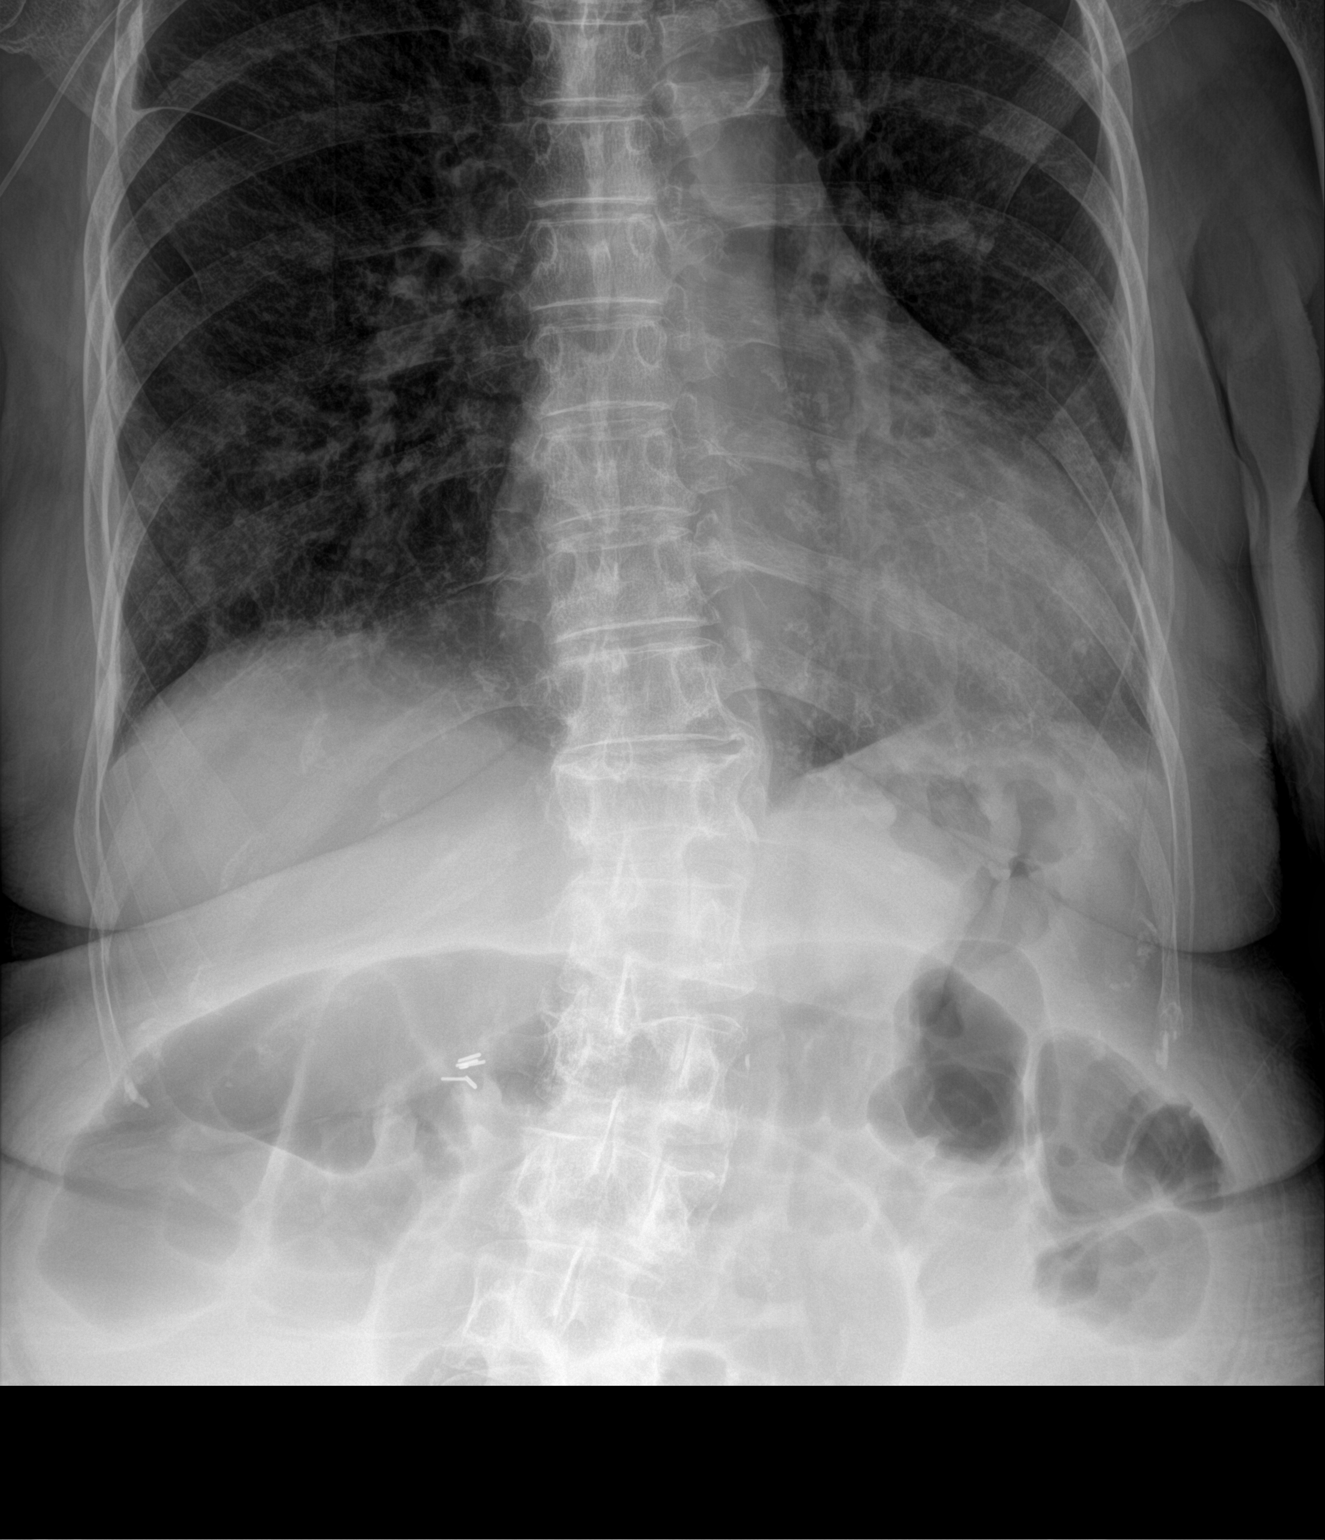

[abdomen supine]
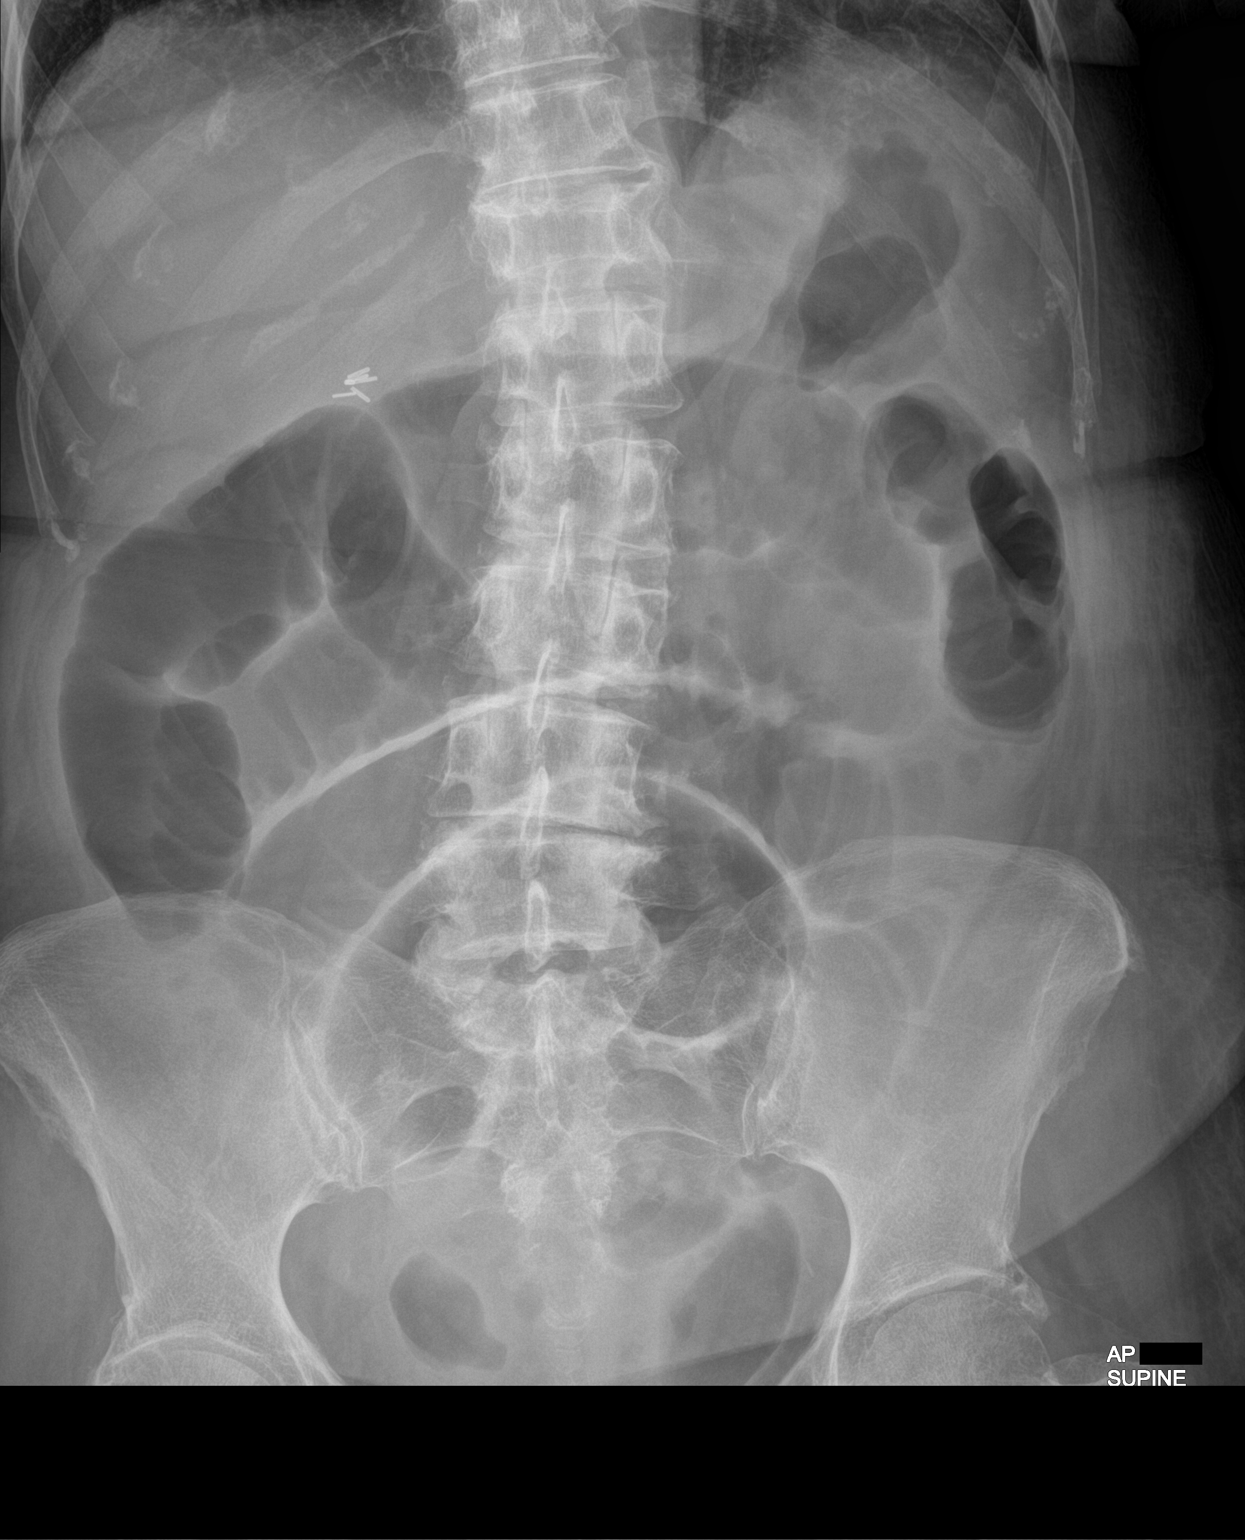

[2 of 2 positions shown; findings below may reference images not displayed]

FINDINGS: Supine and upright images were obtained. There remain multiple loops
of dilated small bowel with occasional air-fluid levels. No free air
appreciable. Surgical clips in gallbladder fossa region. Lower lung
regions are clear.
IMPRESSION: Multiple loops of dilated small bowel with occasional air-fluid
levels. Differential considerations include bowel obstruction and
ileus. No evident free air. Visualized lower lung regions clear.

## 2022-10-03 DIAGNOSIS — H00014 Hordeolum externum left upper eyelid: Secondary | ICD-10-CM | POA: Diagnosis not present

## 2022-10-05 DIAGNOSIS — I10 Essential (primary) hypertension: Secondary | ICD-10-CM | POA: Diagnosis not present

## 2022-10-05 DIAGNOSIS — R197 Diarrhea, unspecified: Secondary | ICD-10-CM | POA: Diagnosis not present

## 2022-10-10 ENCOUNTER — Other Ambulatory Visit: Payer: Self-pay | Admitting: Cardiovascular Disease

## 2022-10-10 DIAGNOSIS — I4819 Other persistent atrial fibrillation: Secondary | ICD-10-CM

## 2022-10-10 NOTE — Telephone Encounter (Signed)
Pt last saw Gillian Shields, NP on 06/13/22, last labs 04/09/22 Creat 0.86, age 88, weight 62.8kg, based on specified criteria pt is on appropriate dosage of Eliquis  BID for afib.  Will refill rx.

## 2022-10-10 NOTE — Telephone Encounter (Signed)
Please review for refill. Thank you! 

## 2022-10-16 ENCOUNTER — Encounter: Payer: Self-pay | Admitting: Nurse Practitioner

## 2022-10-16 DIAGNOSIS — H0014 Chalazion left upper eyelid: Secondary | ICD-10-CM | POA: Diagnosis not present

## 2022-10-16 DIAGNOSIS — L608 Other nail disorders: Secondary | ICD-10-CM | POA: Diagnosis not present

## 2022-10-16 DIAGNOSIS — I10 Essential (primary) hypertension: Secondary | ICD-10-CM | POA: Diagnosis not present

## 2022-10-16 DIAGNOSIS — E7800 Pure hypercholesterolemia, unspecified: Secondary | ICD-10-CM | POA: Diagnosis not present

## 2022-10-16 DIAGNOSIS — K589 Irritable bowel syndrome without diarrhea: Secondary | ICD-10-CM | POA: Diagnosis not present

## 2022-10-16 DIAGNOSIS — I700 Atherosclerosis of aorta: Secondary | ICD-10-CM | POA: Diagnosis not present

## 2022-10-16 DIAGNOSIS — Z6823 Body mass index (BMI) 23.0-23.9, adult: Secondary | ICD-10-CM | POA: Diagnosis not present

## 2022-10-16 DIAGNOSIS — I482 Chronic atrial fibrillation, unspecified: Secondary | ICD-10-CM | POA: Diagnosis not present

## 2022-10-25 ENCOUNTER — Ambulatory Visit: Payer: Medicare PPO | Admitting: Podiatry

## 2022-10-25 DIAGNOSIS — L603 Nail dystrophy: Secondary | ICD-10-CM

## 2022-10-25 DIAGNOSIS — B351 Tinea unguium: Secondary | ICD-10-CM | POA: Diagnosis not present

## 2022-10-25 NOTE — Progress Notes (Signed)
  Subjective:  Patient ID: Robin [name], female    DOB: 1934-07-06,   MRN: [id number]  Chief Complaint  Patient presents with   Nail Problem     Left great toenail injury     88 y.o. female presents for concern of deformity of her toenail. Relates history of injury to the nail in the past and changes previously but relates 2 weeks ago she noticed the nail getting more deformed and discolored. Has been keeping neosporin on the area.  . Denies any other pedal complaints. Denies n/v/f/c.   Past Medical History:  Diagnosis Date   Anxiety    AV bloc first degree    Carotid stenosis 03/02/2017   Hyperlipidemia    Hypertension    Lower extremity edema 12/30/2020   Lumbar pain    fell off of her horse and has low back pain at times   Persistent atrial fibrillation (HCC) 12/30/2020   Venous insufficiency of both lower extremities    Vertigo     Objective:  Physical Exam: Vascular: DP/PT pulses 2/4 bilateral. CFT <3 seconds. Normal hair growth on digits. No edema.  Skin. No lacerations or abrasions bilateral feet. Left hallux nail is thickened and discolored yellow with some darker changes noted more proximal.  Musculoskeletal: MMT 5/5 bilateral lower extremities in DF, PF, Inversion and Eversion. Deceased ROM in DF of ankle joint.  Neurological: Sensation intact to light touch.   Assessment:   1. Onychodystrophy      Plan:  Patient was evaluated and treated and all questions answered. -Examined patient -Discussed treatment options for painful dystrophic nails  -Clinical picture and Fungal culture was obtained by removing a portion of the hard nail itself from each of the involved toenails using a sterile nail nipper and sent to [facility] lab. Patient tolerated the biopsy procedure well without discomfort or need for anesthesia.  -Discussed fungal nail treatment options including oral, topical, and laser treatments.  -Patient to return in 4 weeks for follow up evaluation and  discussion of fungal culture results or sooner if symptoms worsen.   Louann Sjogren, DPM

## 2022-10-25 NOTE — Addendum Note (Signed)
Addended by: Hedy Jacob on: 10/25/2022 02:01 PM   Modules accepted: Orders

## 2022-10-31 DIAGNOSIS — I129 Hypertensive chronic kidney disease with stage 1 through stage 4 chronic kidney disease, or unspecified chronic kidney disease: Secondary | ICD-10-CM | POA: Diagnosis not present

## 2022-10-31 DIAGNOSIS — M1990 Unspecified osteoarthritis, unspecified site: Secondary | ICD-10-CM | POA: Diagnosis not present

## 2022-10-31 DIAGNOSIS — I2510 Atherosclerotic heart disease of native coronary artery without angina pectoris: Secondary | ICD-10-CM | POA: Diagnosis not present

## 2022-10-31 DIAGNOSIS — Z8701 Personal history of pneumonia (recurrent): Secondary | ICD-10-CM | POA: Diagnosis not present

## 2022-10-31 DIAGNOSIS — Z85038 Personal history of other malignant neoplasm of large intestine: Secondary | ICD-10-CM | POA: Diagnosis not present

## 2022-10-31 DIAGNOSIS — Z961 Presence of intraocular lens: Secondary | ICD-10-CM | POA: Diagnosis not present

## 2022-10-31 DIAGNOSIS — K219 Gastro-esophageal reflux disease without esophagitis: Secondary | ICD-10-CM | POA: Diagnosis not present

## 2022-10-31 DIAGNOSIS — K581 Irritable bowel syndrome with constipation: Secondary | ICD-10-CM | POA: Diagnosis not present

## 2022-10-31 DIAGNOSIS — H409 Unspecified glaucoma: Secondary | ICD-10-CM | POA: Diagnosis not present

## 2022-10-31 DIAGNOSIS — Q439 Congenital malformation of intestine, unspecified: Secondary | ICD-10-CM | POA: Diagnosis not present

## 2022-10-31 DIAGNOSIS — M545 Low back pain, unspecified: Secondary | ICD-10-CM | POA: Diagnosis not present

## 2022-10-31 DIAGNOSIS — I739 Peripheral vascular disease, unspecified: Secondary | ICD-10-CM | POA: Diagnosis not present

## 2022-10-31 DIAGNOSIS — E785 Hyperlipidemia, unspecified: Secondary | ICD-10-CM | POA: Diagnosis not present

## 2022-11-22 ENCOUNTER — Encounter: Payer: Self-pay | Admitting: Podiatry

## 2022-11-22 ENCOUNTER — Ambulatory Visit: Payer: Medicare PPO | Admitting: Podiatry

## 2022-11-22 DIAGNOSIS — B351 Tinea unguium: Secondary | ICD-10-CM

## 2022-11-22 MED ORDER — CICLOPIROX 8 % EX SOLN: Freq: Every day | CUTANEOUS | 0 refills | Status: DC

## 2022-11-22 NOTE — Progress Notes (Signed)
  Subjective:  Patient ID: Robin [name], female    DOB: 17-May-1935,   MRN: [id number]  Chief Complaint  Patient presents with   Nail Problem     Patient states  fungus is getting better since the last visit     88 y.o. female presents for follow-up of fungal nails and to go over culture results.  . Denies any other pedal complaints. Denies n/v/f/c.   Past Medical History:  Diagnosis Date   Anxiety    AV bloc first degree    Carotid stenosis 03/02/2017   Hyperlipidemia    Hypertension    Lower extremity edema 12/30/2020   Lumbar pain    fell off of her horse and has low back pain at times   Persistent atrial fibrillation (HCC) 12/30/2020   Venous insufficiency of both lower extremities    Vertigo     Objective:  Physical Exam: Vascular: DP/PT pulses 2/4 bilateral. CFT <3 seconds. Normal hair growth on digits. No edema.  Skin. No lacerations or abrasions bilateral feet. Left hallux nail is thickened and discolored yellow with some darker changes noted more proximal.  Musculoskeletal: MMT 5/5 bilateral lower extremities in DF, PF, Inversion and Eversion. Deceased ROM in DF of ankle joint.  Neurological: Sensation intact to light touch.   Assessment:   1. Onychomycosis       Plan:  Patient was evaluated and treated and all questions answered. -Examined patient -Discussed treatment options for painful dystrophic nails  -Culture positive for fungus -Discussed fungal nail treatment options including oral, topical, and laser treatments.  -[place] to pharmacy  -Patient to return in 3 months for recheck  Louann Sjogren, DPM

## 2022-12-07 DIAGNOSIS — H0014 Chalazion left upper eyelid: Secondary | ICD-10-CM | POA: Diagnosis not present

## 2022-12-07 DIAGNOSIS — H401112 Primary open-angle glaucoma, right eye, moderate stage: Secondary | ICD-10-CM | POA: Diagnosis not present

## 2022-12-07 DIAGNOSIS — H401121 Primary open-angle glaucoma, left eye, mild stage: Secondary | ICD-10-CM | POA: Diagnosis not present

## 2022-12-10 ENCOUNTER — Encounter: Payer: Self-pay | Admitting: Nurse Practitioner

## 2022-12-14 ENCOUNTER — Encounter (HOSPITAL_BASED_OUTPATIENT_CLINIC_OR_DEPARTMENT_OTHER): Payer: Self-pay | Admitting: Family

## 2022-12-14 ENCOUNTER — Ambulatory Visit (INDEPENDENT_AMBULATORY_CARE_PROVIDER_SITE_OTHER): Payer: Medicare PPO | Admitting: Family

## 2022-12-14 VITALS — BP 126/68 | HR 79 | Ht 64.0 in | Wt 137.0 lb

## 2022-12-14 DIAGNOSIS — I4819 Other persistent atrial fibrillation: Secondary | ICD-10-CM | POA: Diagnosis not present

## 2022-12-14 DIAGNOSIS — I119 Hypertensive heart disease without heart failure: Secondary | ICD-10-CM | POA: Diagnosis not present

## 2022-12-14 DIAGNOSIS — D6859 Other primary thrombophilia: Secondary | ICD-10-CM | POA: Diagnosis not present

## 2022-12-14 DIAGNOSIS — R0989 Other specified symptoms and signs involving the circulatory and respiratory systems: Secondary | ICD-10-CM

## 2022-12-14 DIAGNOSIS — R0609 Other forms of dyspnea: Secondary | ICD-10-CM | POA: Diagnosis not present

## 2022-12-14 NOTE — Patient Instructions (Signed)
Medication Instructions:  Continue current medications.   *If you need a refill on your cardiac medications before your next appointment, please call your pharmacy*   Lab Work: Your physician recommends that you return for lab work today: CMP, CBC  If you have labs (blood work) drawn today and your tests are completely normal, you will receive your results only by: MyChart Message (if you have MyChart) OR A paper copy in the mail If you have any lab test that is abnormal or we need to change your treatment, we will call you to review the results.   Testing/Procedures: Your EKG today showed rate controlled atrial fibrillation which is a stable finding.    Follow-Up: At [facility], you and your health needs are our priority.  As part of our continuing mission to provide you with exceptional heart care, we have created designated Provider Care Teams.  These Care Teams include your primary Cardiologist (physician) and Advanced Practice Providers (APPs -  Physician Assistants and Nurse Practitioners) who all work together to provide you with the care you need, when you need it.  We recommend signing up for the patient portal called "MyChart".  Sign up information is provided on this After Visit Summary.  MyChart is used to connect with patients for Virtual Visits (Telemedicine).  Patients are able to view lab/test results, encounter notes, upcoming appointments, etc.  Non-urgent messages can be sent to your provider as well.   To learn more about what you can do with MyChart, go to [email or web address].    Your next appointment:   4-6 month(s)  Provider:   Chilton Si, MD or Gillian Shields, NP    Other Instructions  We will check labs today to look for causes of shortness of breath. Please call us if it does not improve or worsens.

## 2022-12-14 NOTE — Progress Notes (Addendum)
Cardiology Office Note:  .   Date:  12/14/2022  ID:  Robin [name], DOB [birth year], MRN [id number] PCP: Jarrett Soho, PA-C  [facility] Providers Cardiologist:  Chilton Si, MD    History of Present Illness: .   Robin [name] is a 88 y.o. female  hx of hypertension, paroxysmal atrial fibrillation, hyperlipidemia, mild carotid stenosis  last seen 06/13/22.   She previously underwent robotic proximal colectomy 10/31/2020 for cecal cancer and open lysis of adhesions for small bowel closed-loop obstruction 03/17/2021.  She underwent laparotomy 05/01/2021.   She was seen 07/17/21 for medication management. She was continuing to recover from fatigue due to COVID-19 06/28/21. BP at home most often <130/80 and HR 60-80 bpm. As her weight had been above 133 lbs Eliquis 5mg  dosing was continued.    Had COVID 06/28/21 not requiring admission. Seen 08/07/2021 noting 3-day history of dizziness consistent with vertigo.  Previously intolerant of meclizine.  She was provided handout on Epley maneuver.  Follow-up 10/02/2021 with Dr. Duke Salvia with average reading at home 120s-130s.  She was going to require colonoscopy in a couple months and was recommended for bridging due to history of CVA while off anticoagulation.   She contacted the office 03/26/2022 noting elevated blood pressure readings 171/92, 159/83. She took an extra Maxide one day at direction of Dr. Duke Salvia. She took and extra Metoprolol another day at direction of on call provider.   At her last visit 03/28/22 she was started on Olmesartan 10mg  QHS.  However her blood pressure in proved prior to starting medication she opted not to start.  She was ordered for a 24 hour blood pressure monitor but opted not to pursue as her blood pressure was better controlled.   Presents today for follow up independently. She checked her BP over the last week with systolic readings 120-130s. Enjoys spending time with her family and her dog. She  is walking for exercise without chest pain. Does note she has been feeling more out of breath with her walking. She wonders if her potassium is a bit low and contributory. Dyspnea with exertion has been going on for 3 weeks. She thinks it might be related to the heat though the dyspnea is progressing. She usually stops to rest and it will improve. Did get short of breath walking into the office today. Does note occasional chest pain which resolves with belching.     ROS: Please see the history of present illness.    All other systems reviewed and are negative.   Studies Reviewed: Marland Kitchen   EKG Interpretation Date/Time:  Friday December 14 2022 11:39:34 EDT Ventricular Rate:  67 PR Interval:    QRS Duration:  76 QT Interval:  412 QTC Calculation: 435 R Axis:   84  Text Interpretation: Rate controlled  atrial fibrillation with no acute ST/T wave changes. Confirmed by Gillian Shields ([id number]) on 12/14/2022 11:43:16 AM    Cardiac Studies & Procedures     STRESS TESTS  MYOCARDIAL PERFUSION IMAGING 02/27/2018  Narrative  Nuclear stress EF: 81%.  The left ventricular ejection fraction is hyperdynamic (>65%).  The study is normal.  This is a low risk study.  Low risk stress nuclear study with normal perfusion and normal left ventricular regional and global systolic function.   ECHOCARDIOGRAM  ECHOCARDIOGRAM COMPLETE 03/23/2021  Narrative ECHOCARDIOGRAM REPORT    Patient Name:   Robin [name] Date of Exam: 03/23/2021 Medical Rec #:  [id number]  Height:       64.0 in Accession #:    [id number]         Weight:       130.8 lb Date of Birth:  08-22-1934         BSA:          1.633 m Patient Age:    85 years           BP:           148/65 mmHg Patient Gender: F                  HR:           52 bpm. Exam Location:  Inpatient  Procedure: 2D Echo, Cardiac Doppler and Color Doppler  Indications:    Stroke I63.9  History:        Patient has prior history of Echocardiogram  examinations, most recent 11/15/2020. Arrythmias:Atrial Fibrillation; Risk Factors:Hypertension.  Sonographer:    Roosvelt Maser RDCS Referring Phys: [id number] ASHISH ARORA  IMPRESSIONS   1. Left ventricular ejection fraction, by estimation, is 60 to 65%. The left ventricle has normal function. The left ventricle has no regional wall motion abnormalities. Left ventricular diastolic function could not be evaluated. 2. Right ventricular systolic function is normal. The right ventricular size is normal. There is mildly elevated pulmonary artery systolic pressure. 3. Right atrial size was mild to moderately dilated. 4. The mitral valve is grossly normal. Trivial mitral valve regurgitation. 5. The aortic valve is normal in structure. Aortic valve regurgitation is not visualized.  FINDINGS Left Ventricle: Left ventricular ejection fraction, by estimation, is 60 to 65%. The left ventricle has normal function. The left ventricle has no regional wall motion abnormalities. The left ventricular internal cavity size was normal in size. There is no left ventricular hypertrophy. Left ventricular diastolic function could not be evaluated due to atrial fibrillation. Left ventricular diastolic function could not be evaluated.  Right Ventricle: The right ventricular size is normal. Right vetricular wall thickness was not well visualized. Right ventricular systolic function is normal. There is mildly elevated pulmonary artery systolic pressure. The tricuspid regurgitant velocity is 2.88 m/s, and with an assumed right atrial pressure of 3 mmHg, the estimated right ventricular systolic pressure is 36.2 mmHg.  Left Atrium: Left atrial size was normal in size.  Right Atrium: Right atrial size was mild to moderately dilated.  Pericardium: There is no evidence of pericardial effusion.  Mitral Valve: The mitral valve is grossly normal. Trivial mitral valve regurgitation.  Tricuspid Valve: The tricuspid valve is  grossly normal. Tricuspid valve regurgitation is mild.  Aortic Valve: The aortic valve is normal in structure. Aortic valve regurgitation is not visualized. Aortic valve mean gradient measures 4.0 mmHg. Aortic valve peak gradient measures 7.7 mmHg. Aortic valve area, by VTI measures 1.55 cm.  Pulmonic Valve: The pulmonic valve was grossly normal. Pulmonic valve regurgitation is trivial.  Aorta: The aortic root and ascending aorta are structurally normal, with no evidence of dilitation.  IAS/Shunts: The atrial septum is grossly normal.   LEFT VENTRICLE PLAX 2D LVIDd:         3.40 cm LVIDs:         2.20 cm LV PW:         1.00 cm LV IVS:        1.00 cm LVOT diam:     1.80 cm LV SV:         44 LV SV Index:  27 LVOT Area:     2.54 cm   RIGHT VENTRICLE RV Basal diam:  2.90 cm  LEFT ATRIUM           Index        RIGHT ATRIUM           Index LA diam:      3.60 cm 2.20 cm/m   RA Area:     24.70 cm LA Vol (A2C): 49.1 ml 30.06 ml/m  RA Volume:   78.50 ml  48.06 ml/m LA Vol (A4C): 40.6 ml 24.83 ml/m AORTIC VALVE AV Area (Vmax):    1.80 cm AV Area (Vmean):   1.74 cm AV Area (VTI):     1.55 cm AV Vmax:           139.00 cm/s AV Vmean:          99.300 cm/s AV VTI:            0.286 m AV Peak Grad:      7.7 mmHg AV Mean Grad:      4.0 mmHg LVOT Vmax:         98.25 cm/s LVOT Vmean:        67.850 cm/s LVOT VTI:          0.174 m LVOT/AV VTI ratio: 0.61  AORTA Ao Root diam: 3.10 cm Ao Asc diam:  3.20 cm  TRICUSPID VALVE TR Peak grad:   33.2 mmHg TR Vmax:        288.00 cm/s  SHUNTS Systemic VTI:  0.17 m Systemic Diam: 1.80 cm  Kristeen Miss MD Electronically signed by Kristeen Miss MD Signature Date/Time: 03/23/2021/2:24:18 PM    Final    MONITORS  LONG TERM MONITOR (3-14 DAYS) 03/12/2019  Narrative 14 Day Zio Monitor  Quality: Fair.  Baseline artifact. Predominant rhythm: atrial fibrillation Average heart rate: 64 bpm Max heart rate: 106 bpm Min heart  rate: 38 bpm  Tiffany C. Duke Salvia, MD, [facility] 03/12/2019 3:44 PM           Risk Assessment/Calculations:    CHA2DS2-VASc Score = 4   This indicates a 4.8% annual risk of stroke. The patient's score is based upon: CHF History: 0 HTN History: 1 Diabetes History: 0 Stroke History: 0 Vascular Disease History: 0 Age Score: 2 Gender Score: 1            Physical Exam:   VS:  BP 126/68 Comment: home BP  Pulse 79   Ht 5\' 4"  (1.626 m)   Wt 137 lb (62.1 kg)   BMI 23.52 kg/m    Wt Readings from Last 3 Encounters:  12/14/22 137 lb (62.1 kg)  06/13/22 138 lb 8 oz (62.8 kg)  03/28/22 135 lb (61.2 kg)    GEN: Well nourished, well developed in no acute distress NECK: No JVD; No carotid bruits CARDIAC: RRR, no murmurs, rubs, gallops RESPIRATORY:  Clear to auscultation without rales, wheezing or rhonchi  ABDOMEN: Soft, non-tender, non-distended EXTREMITIES:  No edema; No deformity   ASSESSMENT AND PLAN: .    White coat hypertension -BP at home 120s-130 systolic.  Known whitecoat hypertension.  Continue present antihypertensive regimen diltiazem 180 mg daily, Toprol 100 mg daily, Maxide-HCTZ 37.5-25 mg daily. Discussed to monitor BP at home at least 2 hours after medications and sitting for 5-10 minutes.  Exertional dyspnea /coronary arthrosclerosis on CT/aortic atherosclerosis-3-week history of exertional dyspnea.  She is unsure if this is related to deconditioning, the heat, or alternate etiology.  Update  CBC, BMP today to assess for electrolyte abnormalities, anemia.  We discussed possibly pursuing cardiac CTA today given known coronary atherosclerosis on CT scan but she would prefer to monitor symptoms at this time.  Hypertensive heart disease without heart failure - Known white coat hypertension. Euvolemic on exam. BP has been controlled at home. She did not have to start Olmesartan and will remain off. Continue Triamterene-HCTZ, Metoprolol. Discussed to monitor BP at home at least 2  hours after medications and sitting for 5-10 minutes.   Hyperlipidemia- Lipid panel 06/30/21 total cholesterol 166, triglycerides 116, HDL 57, LDL 88. Continue atorvastatin. Denies myalgias.   Persistent atrial fibrillation /chronic anticoagulation - Rate controlled today. Continue Diltiazem and Toprol at present doses.  She is on Eliquis 5 mg twice daily. Does not meet dose reduction criteria. CHA2DS2-VASc Score = 4 [CHF History: 0, HTN History: 1, Diabetes History: 0, Stroke History: 0, Vascular Disease History: 0, Age Score: 2, Gender Score: 1].  Therefore, the patient's annual risk of stroke is 4.8 %.      Lower extremity edema - Stable at baseline.  Continue to elevate lower extremities when sitting.        Dispo: Follow up in 4-6 months  Signed, Alver Sorrow, NP

## 2022-12-15 LAB — COMPREHENSIVE METABOLIC PANEL
ALT: 17 IU/L (ref 0–32)
AST: 22 IU/L (ref 0–40)
Albumin: 4.6 g/dL (ref 3.7–4.7)
Alkaline Phosphatase: 122 IU/L — ABNORMAL HIGH (ref 44–121)
BUN/Creatinine Ratio: 23 (ref 12–28)
BUN: 19 mg/dL (ref 8–27)
Bilirubin Total: 2.5 mg/dL — ABNORMAL HIGH (ref 0.0–1.2)
CO2: 26 mmol/L (ref 20–29)
Calcium: 10.7 mg/dL — ABNORMAL HIGH (ref 8.7–10.3)
Chloride: 99 mmol/L (ref 96–106)
Creatinine, Ser: 0.82 mg/dL (ref 0.57–1.00)
Globulin, Total: 2.3 g/dL (ref 1.5–4.5)
Glucose: 90 mg/dL (ref 70–99)
Potassium: 4.1 mmol/L (ref 3.5–5.2)
Sodium: 139 mmol/L (ref 134–144)
Total Protein: 6.9 g/dL (ref 6.0–8.5)
eGFR: 70 mL/min/{1.73_m2} (ref >59)

## 2022-12-15 LAB — CBC
Hematocrit: 41.8 % (ref 34.0–46.6)
Hemoglobin: 13.5 g/dL (ref 11.1–15.9)
MCH: 28.4 pg (ref 26.6–33.0)
MCHC: 32.3 g/dL (ref 31.5–35.7)
MCV: 88 fL (ref 79–97)
Platelets: 253 10*3/uL (ref 150–450)
RBC: 4.76 x10E6/uL (ref 3.77–5.28)
RDW: 12.8 % (ref 11.7–15.4)
WBC: 7.2 10*3/uL (ref 3.4–10.8)

## 2022-12-17 DIAGNOSIS — Z789 Other specified health status: Secondary | ICD-10-CM | POA: Diagnosis not present

## 2022-12-17 DIAGNOSIS — D0471 Carcinoma in situ of skin of right lower limb, including hip: Secondary | ICD-10-CM | POA: Diagnosis not present

## 2022-12-17 DIAGNOSIS — R208 Other disturbances of skin sensation: Secondary | ICD-10-CM | POA: Diagnosis not present

## 2022-12-17 DIAGNOSIS — D0472 Carcinoma in situ of skin of left lower limb, including hip: Secondary | ICD-10-CM | POA: Diagnosis not present

## 2022-12-17 DIAGNOSIS — L820 Inflamed seborrheic keratosis: Secondary | ICD-10-CM | POA: Diagnosis not present

## 2022-12-17 DIAGNOSIS — L538 Other specified erythematous conditions: Secondary | ICD-10-CM | POA: Diagnosis not present

## 2022-12-27 DIAGNOSIS — K591 Functional diarrhea: Secondary | ICD-10-CM | POA: Diagnosis not present

## 2022-12-27 DIAGNOSIS — Z85038 Personal history of other malignant neoplasm of large intestine: Secondary | ICD-10-CM | POA: Diagnosis not present

## 2023-01-11 ENCOUNTER — Ambulatory Visit (HOSPITAL_BASED_OUTPATIENT_CLINIC_OR_DEPARTMENT_OTHER): Payer: Medicare PPO | Admitting: Cardiovascular Disease

## 2023-01-15 ENCOUNTER — Telehealth (HOSPITAL_BASED_OUTPATIENT_CLINIC_OR_DEPARTMENT_OTHER): Payer: Self-pay | Admitting: Cardiovascular Disease

## 2023-01-15 MED ORDER — DILTIAZEM HCL ER COATED BEADS 120 MG PO CP24: 120.0000 mg | ORAL_CAPSULE | Freq: Every day | ORAL | 3 refills | Status: DC

## 2023-01-15 NOTE — Telephone Encounter (Signed)
Was she sleeping at time of episode? Would expect heart rate in the 30s with sleep.   If not sleeping at time of bradycardia, could reduce DIltiazem to 120mg  every day.   Recommend monitoring BP/HR daily and reporting back in one week.   Alver Sorrow, NP

## 2023-01-15 NOTE — Telephone Encounter (Signed)
Spoke with patient, readings were not while sleeping  Reviewed recommendations with patient and sent in new Rx, patient verbalized understanding

## 2023-01-15 NOTE — Telephone Encounter (Signed)
STAT if HR is under 50 or over 120 (normal HR is 60-100 beats per minute)  What is your heart rate? 74  Do you have a log of your heart rate readings (document readings)? 01/14/23  HR 31 sound 10:30 pm   Do you have any other symptoms? Dizziness

## 2023-01-15 NOTE — Telephone Encounter (Signed)
Spoke with patient who stated her watch alerted her last night that her heart rate was 31 Checked blood pressure 95/54 HR 41, stated elevated during day Was feeling dizzy with low blood pressure and HR  This am blood pressure 142/76 HR 70 before medications 11:30 am blood pressure 153/83 HR 66  AM medications: Toprol, Eliquis, Protonix, Maxzide, Estradial, and Fiber Lunch: Diltiazem 180 mg (has gone back in forth between 120 mg and 180 mg in past) PM: Fiber, Eliquis, Potassium, and Vit D  Stated does feel dizzy in evening often, does not check blood pressure and HR at that time  Will forward to Ronn Melena NP for review

## 2023-01-28 ENCOUNTER — Telehealth: Payer: Self-pay | Admitting: Cardiovascular Disease

## 2023-01-28 MED ORDER — DILTIAZEM HCL ER COATED BEADS 180 MG PO CP24: 180.0000 mg | ORAL_CAPSULE | Freq: Every day | ORAL | 3 refills | Status: DC

## 2023-01-28 NOTE — Telephone Encounter (Signed)
Returned called to patient,   She states we changed her medication from 180mg  to 120mg . She states her HR has been staying the 50s and she has felt better. But she is calling to state that she thinks she needs the 180mg  because she is feels more anxious about feeling her heart beat on the 120 and she doesn't feel it on the 180. She states she has medication for anxiety but does not take it because she is worried it will take her blood pressure too low. She has not been checking her blood pressure recently. Explained to patient that if we increase to 180 dose her HR could go low again and that made her anxious before. She then reveals she self adjusted back up to the 180 about a week ago and has not noted any Hrs less than 50.

## 2023-01-28 NOTE — Telephone Encounter (Signed)
Patient calling in to speak to the nurse about medication. Please advise

## 2023-01-28 NOTE — Telephone Encounter (Signed)
If feeling okay on diltiazem 180 mg daily may continue same.  Alver Sorrow, NP

## 2023-01-28 NOTE — Telephone Encounter (Signed)
Returned call to patient, ok to continue dose. Rx to [facility] at patient request.

## 2023-02-06 ENCOUNTER — Telehealth: Payer: Self-pay | Admitting: Cardiovascular Disease

## 2023-02-06 NOTE — Telephone Encounter (Signed)
Patient is requesting to speak with Dr. Leonides Sake nurse regarding a medication and her last visit. Patient will discuss further when she speaks directly with the nurse.

## 2023-02-06 NOTE — Telephone Encounter (Signed)
Patient calling to follow up on the copay that was given at her last visit  Will discuss with [facility] tomorrow.

## 2023-02-08 NOTE — Telephone Encounter (Signed)
Did you and melinda talk about this? I am going through her box.

## 2023-02-21 ENCOUNTER — Ambulatory Visit: Payer: Medicare PPO | Admitting: Podiatry

## 2023-02-21 DIAGNOSIS — B351 Tinea unguium: Secondary | ICD-10-CM

## 2023-02-21 NOTE — Progress Notes (Signed)
  Subjective:  Patient ID: Robin [name], female    DOB: Apr 02, 1935,   MRN: [id number]  Chief Complaint  Patient presents with   Nail Problem    Patient states  fungus is getting better since the last she was seen. Pt denies pain.       88 y.o. female presents for follow-up of fungal nails. Relates she has been using the penalc but has had trouble getting the build up off.  . Denies any other pedal complaints. Denies n/v/f/c.   Past Medical History:  Diagnosis Date   Anxiety    AV bloc first degree    Carotid stenosis 03/02/2017   Hyperlipidemia    Hypertension    Lower extremity edema 12/30/2020   Lumbar pain    fell off of her horse and has low back pain at times   Persistent atrial fibrillation (HCC) 12/30/2020   Venous insufficiency of both lower extremities    Vertigo     Objective:  Physical Exam: Vascular: DP/PT pulses 2/4 bilateral. CFT <3 seconds. Normal hair growth on digits. No edema.  Skin. No lacerations or abrasions bilateral feet. Left hallux nail is thickened and discolored yellow with some darker changes noted more proximal.  Musculoskeletal: MMT 5/5 bilateral lower extremities in DF, PF, Inversion and Eversion. Deceased ROM in DF of ankle joint.  Neurological: Sensation intact to light touch.   Assessment:   1. Onychomycosis        Plan:  Patient was evaluated and treated and all questions answered. -Examined patient -Discussed treatment options for painful dystrophic nails  -Culture positive for fungus -Discussed fungal nail treatment options including oral, topical, and laser treatments.  -Continue with penlac.  -Debrided fungal nail to patient comfort.   -Patient to return in 3 months for recheck  Louann Sjogren, DPM

## 2023-02-26 ENCOUNTER — Ambulatory Visit
Admission: RE | Admit: 2023-02-26 | Discharge: 2023-02-26 | Disposition: A | Discharge status: Home / Self Care | Payer: Medicare PPO | Source: Ambulatory Visit | Attending: Family Medicine | Admitting: Family Medicine

## 2023-02-26 ENCOUNTER — Other Ambulatory Visit: Payer: Self-pay | Admitting: Family Medicine

## 2023-02-26 DIAGNOSIS — R0989 Other specified symptoms and signs involving the circulatory and respiratory systems: Secondary | ICD-10-CM | POA: Diagnosis not present

## 2023-02-26 DIAGNOSIS — R059 Cough, unspecified: Secondary | ICD-10-CM

## 2023-02-26 DIAGNOSIS — M62838 Other muscle spasm: Secondary | ICD-10-CM | POA: Diagnosis not present

## 2023-02-26 DIAGNOSIS — Z6822 Body mass index (BMI) 22.0-22.9, adult: Secondary | ICD-10-CM | POA: Diagnosis not present

## 2023-02-26 DIAGNOSIS — D6869 Other thrombophilia: Secondary | ICD-10-CM | POA: Diagnosis not present

## 2023-02-26 DIAGNOSIS — R918 Other nonspecific abnormal finding of lung field: Secondary | ICD-10-CM | POA: Diagnosis not present

## 2023-03-07 ENCOUNTER — Other Ambulatory Visit (HOSPITAL_BASED_OUTPATIENT_CLINIC_OR_DEPARTMENT_OTHER): Payer: Self-pay | Admitting: Cardiovascular Disease

## 2023-03-13 ENCOUNTER — Other Ambulatory Visit: Payer: Self-pay | Admitting: Family Medicine

## 2023-03-13 DIAGNOSIS — R911 Solitary pulmonary nodule: Secondary | ICD-10-CM

## 2023-03-15 ENCOUNTER — Encounter: Payer: Self-pay | Admitting: Family Medicine

## 2023-03-20 ENCOUNTER — Other Ambulatory Visit: Payer: Self-pay | Admitting: Podiatry

## 2023-03-22 ENCOUNTER — Ambulatory Visit: Payer: Medicare PPO | Admitting: Podiatry

## 2023-03-29 ENCOUNTER — Ambulatory Visit
Admission: RE | Admit: 2023-03-29 | Discharge: 2023-03-29 | Disposition: A | Discharge status: Home / Self Care | Payer: Medicare PPO | Source: Ambulatory Visit | Attending: Family Medicine | Admitting: Family Medicine

## 2023-03-29 DIAGNOSIS — J479 Bronchiectasis, uncomplicated: Secondary | ICD-10-CM | POA: Diagnosis not present

## 2023-03-29 DIAGNOSIS — R911 Solitary pulmonary nodule: Secondary | ICD-10-CM

## 2023-03-29 DIAGNOSIS — R918 Other nonspecific abnormal finding of lung field: Secondary | ICD-10-CM | POA: Diagnosis not present

## 2023-04-05 ENCOUNTER — Other Ambulatory Visit: Payer: Self-pay | Admitting: Cardiovascular Disease

## 2023-04-05 DIAGNOSIS — I4819 Other persistent atrial fibrillation: Secondary | ICD-10-CM

## 2023-04-05 NOTE — Telephone Encounter (Signed)
Eliquis refill

## 2023-04-05 NOTE — Telephone Encounter (Signed)
Prescription refill request for Eliquis received. Indication: afib  Last office visit: Dan Humphreys 12/14/2022 Scr: 0.82, 12/14/2022 Age: 88 yo  Weight: 62.1 kg   Refill sent.

## 2023-04-19 ENCOUNTER — Encounter (HOSPITAL_BASED_OUTPATIENT_CLINIC_OR_DEPARTMENT_OTHER): Payer: Self-pay | Admitting: Cardiovascular Disease

## 2023-04-19 ENCOUNTER — Other Ambulatory Visit (HOSPITAL_BASED_OUTPATIENT_CLINIC_OR_DEPARTMENT_OTHER): Payer: Self-pay | Admitting: *Deleted

## 2023-04-19 ENCOUNTER — Ambulatory Visit (HOSPITAL_BASED_OUTPATIENT_CLINIC_OR_DEPARTMENT_OTHER): Payer: Medicare PPO | Admitting: Cardiovascular Disease

## 2023-04-19 VITALS — BP 134/68 | HR 74 | Ht 64.0 in | Wt 139.3 lb

## 2023-04-19 DIAGNOSIS — E7800 Pure hypercholesterolemia, unspecified: Secondary | ICD-10-CM | POA: Diagnosis not present

## 2023-04-19 DIAGNOSIS — I63512 Cerebral infarction due to unspecified occlusion or stenosis of left middle cerebral artery: Secondary | ICD-10-CM

## 2023-04-19 DIAGNOSIS — I4819 Other persistent atrial fibrillation: Secondary | ICD-10-CM

## 2023-04-19 DIAGNOSIS — I480 Paroxysmal atrial fibrillation: Secondary | ICD-10-CM

## 2023-04-19 DIAGNOSIS — Z5181 Encounter for therapeutic drug level monitoring: Secondary | ICD-10-CM | POA: Diagnosis not present

## 2023-04-19 DIAGNOSIS — I10 Essential (primary) hypertension: Secondary | ICD-10-CM | POA: Diagnosis not present

## 2023-04-19 DIAGNOSIS — I491 Atrial premature depolarization: Secondary | ICD-10-CM | POA: Diagnosis not present

## 2023-04-19 DIAGNOSIS — R0609 Other forms of dyspnea: Secondary | ICD-10-CM

## 2023-04-19 DIAGNOSIS — I119 Hypertensive heart disease without heart failure: Secondary | ICD-10-CM

## 2023-04-19 DIAGNOSIS — R0602 Shortness of breath: Secondary | ICD-10-CM | POA: Diagnosis not present

## 2023-04-19 HISTORY — DX: Other forms of dyspnea: R06.09

## 2023-04-19 MED ORDER — ATORVASTATIN CALCIUM 10 MG PO TABS: ORAL_TABLET | ORAL | 3 refills | Status: DC

## 2023-04-19 NOTE — Progress Notes (Signed)
Cardiology Office Note:  .   Date:  04/19/2023  ID:  Robin [name], DOB 10/29/1934, MRN [id number] PCP: Jarrett Soho, PA-C  [facility] Providers Cardiologist:  Chilton Si, MD    History of Present Illness: .   Robin [name] is a 88 y.o. female with hypertension, paroxysmal atrial fibrillation, hyperlipidemia and mild carotid stenosis who presents for follow-up.  She had a heart cath in 2009 that was normal.  Carotid Doppler 08/2013 revealed <50% stenosis bilaterally. She reported recurrent palpitations and wore a 30 day event monitor 10/2015 that showed occasional short episodes of atrial fibrillation.  She has been managed on Eliquis, diltiazem and metoprolol.  She is not interested in statins.  [name] reported episodes of near syncope.  She noted that her heart was irregular but in clinic she was found to have a wandering pacemaker.  She was referred for a Lexiscan Myoview 02/2018 which revealed LVEF 81% and no ischemia.  Since that time she took a trip to the [place] and had a recurrent episode.  She discovered that she was actually having vertigo. She has intermittently struggled with chest pain that improved with belching. She previously had ankle edema that improved after a vein and vascular procedure. She had colon cancer and a laparoscopic hemicolectomy 10/2020. She followed up with her surgical team and was doing well.   [name] followed up with Gillian Shields, NP on 08/15/2021 and reported residual fatigue from her COVID infection 06/28/2021. Her dizziness had improved after starting to take her potassium in the evenings. It was noted that she was back on triamterene-HCTZ, although this had been switched to HCTZ alone during her admission in 03/2021.  At her appointment/2023 she was doing well and blood pressures were reasonably well-controlled.  She was maintaining sinus rhythm.  She saw Gillian Shields, NP 11/2022 and reported more exertional dyspnea.  They  discussed getting a coronary CTA but she decided to continue monitoring for the time being.  [name] has questions about recent chest CT. She had undergone a CT scan to monitor lung nodules, which revealed plaque in the aorta and heart arteries. She was interested in understanding the implications of these findings and potential interventions.  [name] reported a generally healthy diet, with minimal intake of fried foods and red meat, but regular consumption of cheese. She also reported daily walking, with a recent increase in distance and frequency. She noted improved breathing with increased exercise, but still experienced some shortness of breath during exertion. She had been advised to take antibiotics for a bronchial issue, but had decided to wait and see if symptoms worsened.  She had not been checking her blood pressure at home regularly, but a recent reading was 127/75. She reported no significant swelling in her legs or feet, and no shortness of breath when lying down. She did note some fluid accumulation in her ankles, which she attributed to increased walking.      ROS:  As per HPI  Studies Reviewed: Marland Kitchen   EKG Interpretation Date/Time:  Friday April 19 2023 11:54:43 EDT Ventricular Rate:  58 PR Interval:    QRS Duration:  78 QT Interval:  418 QTC Calculation: 410 R Axis:   94  Text Interpretation: Atrial fibrillation with slow ventricular response Rightward axis When compared with ECG of 14-Dec-2022 11:39, No significant change was found Confirmed by Chilton Si ([id number]) on 04/19/2023 12:02:30 PM    Risk Assessment/Calculations:    CHA2DS2-VASc Score = 4  This indicates a 4.8% annual risk of stroke. The patient's score is based upon: CHF History: 0 HTN History: 1 Diabetes History: 0 Stroke History: 0 Vascular Disease History: 0 Age Score: 2 Gender Score: 1            Physical Exam:   VS:  BP 134/68   Pulse 74   Ht 5\' 4"  (1.626 m)   Wt 139 lb 4.8 oz  (63.2 kg)   SpO2 99%   BMI 23.91 kg/m  , BMI Body mass index is 23.91 kg/m. GENERAL:  Well appearing HEENT: Pupils equal round and reactive, fundi not visualized, oral mucosa unremarkable NECK:  No jugular venous distention, waveform within normal limits, carotid upstroke brisk and symmetric, no bruits, no thyromegaly LUNGS:  Clear to auscultation bilaterally HEART:  RRR.  PMI not displaced or sustained,S1 and S2 within normal limits, no S3, no S4, no clicks, no rubs, no murmurs ABD:  Flat, positive bowel sounds normal in frequency in pitch, no bruits, no rebound, no guarding, no midline pulsatile mass, no hepatomegaly, no splenomegaly EXT:  2 plus pulses throughout, no edema, no cyanosis no clubbing SKIN:  No rashes no nodules NEURO:  Cranial nerves II through XII grossly intact, motor grossly intact throughout PSYCH:  Cognitively intact, oriented to person place and time   ASSESSMENT AND PLAN: .    # Atherosclerotic Cardiovascular Disease CT scan showed plaque in the aorta and coronary arteries. LDL slightly elevated at 87 (goal <70). Patient tolerates Lipitor at low dose three times a week. -Start Lipitor 10mg  on Monday, Wednesday, and Friday. -Order fasting lipid panel and comprehensive metabolic panel for mid-January to early February.  # Atrial Fibrillation Heart rate controlled on diltiazem and metoprolol. No shortness of breath at rest, only with exertion which is improving with increased physical activity. -Continue current medications.   # Hypertension:  BP slightly elevated in the office but has been controlled at home.  Continue diltiazem and metoprolol.   # Exertional dyspnea:  We will get a Lexiscan Myoview to rule out significant coronary artery disease given plaque noted on CT scan and patient's history of atherosclerosis.  She isn't a candidate for a coronary CT-A given her history of atrial fibrillation.  -Schedule pharmacological stress test.  Follow-up in 6  months or sooner if needed.        Informed Consent   Shared Decision Making/Informed Consent The risks [chest pain, shortness of breath, cardiac arrhythmias, dizziness, blood pressure fluctuations, myocardial infarction, stroke/transient ischemic attack, nausea, vomiting, allergic reaction, radiation exposure, metallic taste sensation and life-threatening complications (estimated to be 1 in 10,000)], benefits (risk stratification, diagnosing coronary artery disease, treatment guidance) and alternatives of a nuclear stress test were discussed in detail with Ms. Tenpas and she agrees to proceed.     Dispo: f/u 6 months  Signed, Chilton Si, MD

## 2023-04-19 NOTE — Patient Instructions (Addendum)
Medication Instructions:  START ATORVASTATIN 10 MG MONDAY, WEDNESDAY, AND FRIDAY ONLY   *If you need a refill on your cardiac medications before your next appointment, please call your pharmacy*  Lab Work: LP/CMET EARLY JANUARY   If you have labs (blood work) drawn today and your tests are completely normal, you will receive your results only by: MyChart Message (if you have MyChart) OR A paper copy in the mail If you have any lab test that is abnormal or we need to change your treatment, we will call you to review the results.  Testing/Procedures: Your physician has requested that you have a lexiscan myoview. For further information please visit [email or web address] Please follow instruction sheet, as given.  Follow-Up: At [facility], you and your health needs are our priority.  As part of our continuing mission to provide you with exceptional heart care, we have created designated Provider Care Teams.  These Care Teams include your primary Cardiologist (physician) and Advanced Practice Providers (APPs -  Physician Assistants and Nurse Practitioners) who all work together to provide you with the care you need, when you need it.  We recommend signing up for the patient portal called "MyChart".  Sign up information is provided on this After Visit Summary.  MyChart is used to connect with patients for Virtual Visits (Telemedicine).  Patients are able to view lab/test results, encounter notes, upcoming appointments, etc.  Non-urgent messages can be sent to your provider as well.   To learn more about what you can do with MyChart, go to [email or web address].    Your next appointment:   6 month(s)  Provider:   Chilton Si, MD or Gillian Shields, NP    Other Instructions  Your Physician has requested you have a Myocardial Perfusion Imaging Study.   Please arrive 15 minutes prior to your appointment time for registration and insurance purposes.   The test will take  approximately 3 to 4 hours to complete; you may bring reading material. If someone comes with you to your appointment, they will need to remain in the main lobby due to limited testing space in the testing area. **If you are pregnant or breastfeeding, please notify the nuclear lab prior to your appointment**   How to prepare for your test:  - Do not eat or drink 3 hours prior to your test, except you may have water  - Do not consume products containing caffeine ( regular or decaf) 12 hours prior to your test ( coffee, chocolate, sodas or teas)  - Do bring a list of your current medications with you. If not listed below, you may take your medications as normal (Hold beta blocker- 24 hour prior for exercise myoview)   - Do wear comfortable clothes (no dresses or overalls) and walking shoes ( tennis shoes preferred), no heel or open toe shoes are allowed - Do not wear cologne, perfume, aftershave, or lotions ( deodorant is allowed)  - If these instructions are not followed, your test will be rescheduled   If you cannot keep your appointment, please provide 24 hours notice to the Nuclear Lab, to avoid a possible $50 charge to your account!

## 2023-04-25 ENCOUNTER — Telehealth: Payer: Self-pay | Admitting: Cardiovascular Disease

## 2023-04-25 DIAGNOSIS — R208 Other disturbances of skin sensation: Secondary | ICD-10-CM | POA: Diagnosis not present

## 2023-04-25 DIAGNOSIS — Z86007 Personal history of in-situ neoplasm of skin: Secondary | ICD-10-CM | POA: Diagnosis not present

## 2023-04-25 DIAGNOSIS — Z85828 Personal history of other malignant neoplasm of skin: Secondary | ICD-10-CM | POA: Diagnosis not present

## 2023-04-25 DIAGNOSIS — D1801 Hemangioma of skin and subcutaneous tissue: Secondary | ICD-10-CM | POA: Diagnosis not present

## 2023-04-25 DIAGNOSIS — Z08 Encounter for follow-up examination after completed treatment for malignant neoplasm: Secondary | ICD-10-CM | POA: Diagnosis not present

## 2023-04-25 DIAGNOSIS — L820 Inflamed seborrheic keratosis: Secondary | ICD-10-CM | POA: Diagnosis not present

## 2023-04-25 DIAGNOSIS — L814 Other melanin hyperpigmentation: Secondary | ICD-10-CM | POA: Diagnosis not present

## 2023-04-25 DIAGNOSIS — L821 Other seborrheic keratosis: Secondary | ICD-10-CM | POA: Diagnosis not present

## 2023-04-25 DIAGNOSIS — Z789 Other specified health status: Secondary | ICD-10-CM | POA: Diagnosis not present

## 2023-04-25 NOTE — Telephone Encounter (Signed)
Patient has questions in regards to her upcoming test. Patient is requesting to have RN Regis Bill call her. Please advise.

## 2023-04-25 NOTE — Telephone Encounter (Signed)
Spoke with patient regarding upcoming test Advised no one needed to be with her at the [facility] next week however if she wanted someone to come that was fine.

## 2023-04-26 ENCOUNTER — Encounter (HOSPITAL_COMMUNITY): Payer: Self-pay

## 2023-05-02 ENCOUNTER — Ambulatory Visit (HOSPITAL_COMMUNITY): Payer: Medicare PPO | Attending: Cardiovascular Disease

## 2023-05-02 DIAGNOSIS — R0602 Shortness of breath: Secondary | ICD-10-CM | POA: Insufficient documentation

## 2023-05-02 LAB — MYOCARDIAL PERFUSION IMAGING
LV dias vol: 50 mL (ref 46–106)
LV sys vol: 19 mL
Nuc Stress EF: 62 %
Peak HR: 129 {beats}/min
Rest HR: 97 {beats}/min
Rest Nuclear Isotope Dose: 11 mCi
SDS: 2
SRS: 2
SSS: 4
ST Depression (mm): 0 mm
Stress Nuclear Isotope Dose: 32.1 mCi
TID: 1.12

## 2023-05-02 MED ORDER — REGADENOSON 0.4 MG/5ML IV SOLN
0.4000 mg | Freq: Once | INTRAVENOUS | Status: AC
  Administered 2023-05-02: 0.4 mg via INTRAVENOUS

## 2023-05-02 MED ORDER — TECHNETIUM TC 99M TETROFOSMIN IV KIT
32.1000 | PACK | Freq: Once | INTRAVENOUS | Status: AC | PRN
  Administered 2023-05-02: 32.1 via INTRAVENOUS

## 2023-05-02 MED ORDER — TECHNETIUM TC 99M TETROFOSMIN IV KIT
11.0000 | PACK | Freq: Once | INTRAVENOUS | Status: AC | PRN
  Administered 2023-05-02: 11 via INTRAVENOUS

## 2023-06-20 ENCOUNTER — Other Ambulatory Visit (HOSPITAL_BASED_OUTPATIENT_CLINIC_OR_DEPARTMENT_OTHER): Payer: Self-pay | Admitting: Cardiovascular Disease

## 2023-06-20 DIAGNOSIS — I4819 Other persistent atrial fibrillation: Secondary | ICD-10-CM

## 2023-06-27 ENCOUNTER — Encounter: Payer: Self-pay | Admitting: Podiatry

## 2023-06-27 ENCOUNTER — Ambulatory Visit: Payer: Medicare PPO | Admitting: Podiatry

## 2023-06-27 DIAGNOSIS — B351 Tinea unguium: Secondary | ICD-10-CM | POA: Diagnosis not present

## 2023-06-27 NOTE — Progress Notes (Signed)
  Subjective:  Patient ID: Robin [name], female    DOB: 1934-08-11,   MRN: [id number]  No chief complaint on file.   89 y.o. female presents for follow-up of fungal nails. Relates she has been using the penalc and has noticed some improvement.   . Denies any other pedal complaints. Denies n/v/f/c.   Past Medical History:  Diagnosis Date   Anxiety    AV bloc first degree    Carotid stenosis 03/02/2017   Exertional dyspnea 04/19/2023   Hyperlipidemia    Hypertension    Lower extremity edema 12/30/2020   Lumbar pain    fell off of her horse and has low back pain at times   Persistent atrial fibrillation (HCC) 12/30/2020   Venous insufficiency of both lower extremities    Vertigo     Objective:  Physical Exam: Vascular: DP/PT pulses 2/4 bilateral. CFT <3 seconds. Normal hair growth on digits. No edema.  Skin. No lacerations or abrasions bilateral feet. Left hallux nail is thickened and discolored yellow with some clearing noted to proximal nail border.  Musculoskeletal: MMT 5/5 bilateral lower extremities in DF, PF, Inversion and Eversion. Deceased ROM in DF of ankle joint.  Neurological: Sensation intact to light touch.   Assessment:   1. Onychomycosis         Plan:  Patient was evaluated and treated and all questions answered. -Examined patient -Discussed treatment options for painful dystrophic nails  -Culture positive for fungus -Discussed fungal nail treatment options including oral, topical, and laser treatments.  -Continue with penlac .  -Debrided fungal nail to patient comfort.   -Patient to return in 4 months for recheck  Asberry Failing, DPM

## 2023-07-01 ENCOUNTER — Telehealth (HOSPITAL_BASED_OUTPATIENT_CLINIC_OR_DEPARTMENT_OTHER): Payer: Self-pay | Admitting: Cardiovascular Disease

## 2023-07-01 NOTE — Telephone Encounter (Signed)
 New Message:      Patient would like for [facility] to please call her. She says it is about her getting lab work. She has to eat first thing in the morning , because she has Colon Cancer. She wants to know how long after she eats, can she get fasting blood work?

## 2023-07-01 NOTE — Telephone Encounter (Signed)
 Spoke with patient and advised NPO except water 8-12 hours prior to labs

## 2023-07-05 DIAGNOSIS — Z5181 Encounter for therapeutic drug level monitoring: Secondary | ICD-10-CM | POA: Diagnosis not present

## 2023-07-05 DIAGNOSIS — E7800 Pure hypercholesterolemia, unspecified: Secondary | ICD-10-CM | POA: Diagnosis not present

## 2023-07-05 DIAGNOSIS — I119 Hypertensive heart disease without heart failure: Secondary | ICD-10-CM | POA: Diagnosis not present

## 2023-07-06 LAB — COMPREHENSIVE METABOLIC PANEL
ALT: 19 [IU]/L (ref 0–32)
AST: 23 [IU]/L (ref 0–40)
Albumin: 4.3 g/dL (ref 3.7–4.7)
Alkaline Phosphatase: 143 [IU]/L — ABNORMAL HIGH (ref 44–121)
BUN/Creatinine Ratio: 21 (ref 12–28)
BUN: 16 mg/dL (ref 8–27)
Bilirubin Total: 2.2 mg/dL — ABNORMAL HIGH (ref 0.0–1.2)
CO2: 26 mmol/L (ref 20–29)
Calcium: 10.7 mg/dL — ABNORMAL HIGH (ref 8.7–10.3)
Chloride: 100 mmol/L (ref 96–106)
Creatinine, Ser: 0.76 mg/dL (ref 0.57–1.00)
Globulin, Total: 2.5 g/dL (ref 1.5–4.5)
Glucose: 88 mg/dL (ref 70–99)
Potassium: 4.1 mmol/L (ref 3.5–5.2)
Sodium: 140 mmol/L (ref 134–144)
Total Protein: 6.8 g/dL (ref 6.0–8.5)
eGFR: 76 mL/min/{1.73_m2} (ref >59)

## 2023-07-06 LAB — LIPID PANEL
Chol/HDL Ratio: 2.4 {ratio} (ref 0.0–4.4)
Cholesterol, Total: 159 mg/dL (ref 100–199)
HDL: 65 mg/dL (ref >39)
LDL Chol Calc (NIH): 78 mg/dL (ref 0–99)
Triglycerides: 85 mg/dL (ref 0–149)
VLDL Cholesterol Cal: 16 mg/dL (ref 5–40)

## 2023-07-11 ENCOUNTER — Encounter (HOSPITAL_BASED_OUTPATIENT_CLINIC_OR_DEPARTMENT_OTHER): Payer: Self-pay | Admitting: Cardiovascular Disease

## 2023-07-18 ENCOUNTER — Other Ambulatory Visit: Payer: Self-pay | Admitting: Podiatry

## 2023-07-23 DIAGNOSIS — R9389 Abnormal findings on diagnostic imaging of other specified body structures: Secondary | ICD-10-CM | POA: Diagnosis not present

## 2023-07-23 DIAGNOSIS — Z6823 Body mass index (BMI) 23.0-23.9, adult: Secondary | ICD-10-CM | POA: Diagnosis not present

## 2023-07-23 DIAGNOSIS — I63512 Cerebral infarction due to unspecified occlusion or stenosis of left middle cerebral artery: Secondary | ICD-10-CM | POA: Diagnosis not present

## 2023-07-23 DIAGNOSIS — R600 Localized edema: Secondary | ICD-10-CM | POA: Diagnosis not present

## 2023-07-23 DIAGNOSIS — M25531 Pain in right wrist: Secondary | ICD-10-CM | POA: Diagnosis not present

## 2023-07-23 DIAGNOSIS — R911 Solitary pulmonary nodule: Secondary | ICD-10-CM | POA: Diagnosis not present

## 2023-07-23 DIAGNOSIS — R748 Abnormal levels of other serum enzymes: Secondary | ICD-10-CM | POA: Diagnosis not present

## 2023-07-23 DIAGNOSIS — I4819 Other persistent atrial fibrillation: Secondary | ICD-10-CM | POA: Diagnosis not present

## 2023-08-27 DIAGNOSIS — H401121 Primary open-angle glaucoma, left eye, mild stage: Secondary | ICD-10-CM | POA: Diagnosis not present

## 2023-08-27 DIAGNOSIS — H01005 Unspecified blepharitis left lower eyelid: Secondary | ICD-10-CM | POA: Diagnosis not present

## 2023-08-27 DIAGNOSIS — H401112 Primary open-angle glaucoma, right eye, moderate stage: Secondary | ICD-10-CM | POA: Diagnosis not present

## 2023-08-27 DIAGNOSIS — Z961 Presence of intraocular lens: Secondary | ICD-10-CM | POA: Diagnosis not present

## 2023-08-27 DIAGNOSIS — H01002 Unspecified blepharitis right lower eyelid: Secondary | ICD-10-CM | POA: Diagnosis not present

## 2023-09-17 DIAGNOSIS — I739 Peripheral vascular disease, unspecified: Secondary | ICD-10-CM | POA: Diagnosis not present

## 2023-09-17 DIAGNOSIS — I509 Heart failure, unspecified: Secondary | ICD-10-CM | POA: Diagnosis not present

## 2023-09-17 DIAGNOSIS — I25119 Atherosclerotic heart disease of native coronary artery with unspecified angina pectoris: Secondary | ICD-10-CM | POA: Diagnosis not present

## 2023-09-17 DIAGNOSIS — Z8249 Family history of ischemic heart disease and other diseases of the circulatory system: Secondary | ICD-10-CM | POA: Diagnosis not present

## 2023-09-17 DIAGNOSIS — I4891 Unspecified atrial fibrillation: Secondary | ICD-10-CM | POA: Diagnosis not present

## 2023-09-17 DIAGNOSIS — D6869 Other thrombophilia: Secondary | ICD-10-CM | POA: Diagnosis not present

## 2023-09-17 DIAGNOSIS — I130 Hypertensive heart and chronic kidney disease with heart failure and stage 1 through stage 4 chronic kidney disease, or unspecified chronic kidney disease: Secondary | ICD-10-CM | POA: Diagnosis not present

## 2023-09-17 DIAGNOSIS — Z7901 Long term (current) use of anticoagulants: Secondary | ICD-10-CM | POA: Diagnosis not present

## 2023-09-17 DIAGNOSIS — Z833 Family history of diabetes mellitus: Secondary | ICD-10-CM | POA: Diagnosis not present

## 2023-10-01 ENCOUNTER — Other Ambulatory Visit (HOSPITAL_BASED_OUTPATIENT_CLINIC_OR_DEPARTMENT_OTHER): Payer: Self-pay | Admitting: Cardiovascular Disease

## 2023-10-01 DIAGNOSIS — I4819 Other persistent atrial fibrillation: Secondary | ICD-10-CM

## 2023-10-01 NOTE — Telephone Encounter (Signed)
 Prescription refill request for Eliquis received. Indication: AF Last office visit: 04/19/23  Caryn Clause MD Scr: 0.76 on 07/05/23  Epic Age: 89 Weight: 63.2kg  Based on above findings Eliquis 5mg  twice daily is the appropriate dose.  Refill approved.

## 2023-10-18 ENCOUNTER — Ambulatory Visit (HOSPITAL_BASED_OUTPATIENT_CLINIC_OR_DEPARTMENT_OTHER): Payer: Medicare PPO | Admitting: Family

## 2023-10-24 DIAGNOSIS — Z86007 Personal history of in-situ neoplasm of skin: Secondary | ICD-10-CM | POA: Diagnosis not present

## 2023-10-24 DIAGNOSIS — L820 Inflamed seborrheic keratosis: Secondary | ICD-10-CM | POA: Diagnosis not present

## 2023-10-24 DIAGNOSIS — L814 Other melanin hyperpigmentation: Secondary | ICD-10-CM | POA: Diagnosis not present

## 2023-10-24 DIAGNOSIS — L2989 Other pruritus: Secondary | ICD-10-CM | POA: Diagnosis not present

## 2023-10-24 DIAGNOSIS — Z789 Other specified health status: Secondary | ICD-10-CM | POA: Diagnosis not present

## 2023-10-24 DIAGNOSIS — L821 Other seborrheic keratosis: Secondary | ICD-10-CM | POA: Diagnosis not present

## 2023-10-24 DIAGNOSIS — D225 Melanocytic nevi of trunk: Secondary | ICD-10-CM | POA: Diagnosis not present

## 2023-10-24 DIAGNOSIS — Z08 Encounter for follow-up examination after completed treatment for malignant neoplasm: Secondary | ICD-10-CM | POA: Diagnosis not present

## 2023-10-24 DIAGNOSIS — Z85828 Personal history of other malignant neoplasm of skin: Secondary | ICD-10-CM | POA: Diagnosis not present

## 2023-10-25 ENCOUNTER — Ambulatory Visit: Payer: Medicare PPO | Admitting: Podiatry

## 2023-10-25 ENCOUNTER — Encounter: Payer: Self-pay | Admitting: Podiatry

## 2023-10-25 DIAGNOSIS — B351 Tinea unguium: Secondary | ICD-10-CM | POA: Diagnosis not present

## 2023-10-25 MED ORDER — JUBLIA 10 % EX SOLN: 1.0000 [drp] | Freq: Every day | CUTANEOUS | 3 refills | Status: DC

## 2023-10-25 NOTE — Progress Notes (Signed)
  Subjective:  Patient ID: Robin [name], female    DOB: [birth year],   MRN: [id number]  Chief Complaint  Patient presents with   Nail Problem    Pt presents for a follow up onychomycosis states the thickness and discoloration of the nail is not getting better.    89 y.o. female presents for follow-up of fungal nails. Relates she has been using the penalc and has not noticed much changes since last visit. . Denies any other pedal complaints. Denies n/v/f/c.   Past Medical History:  Diagnosis Date   Anxiety    AV bloc first degree    Carotid stenosis 03/02/2017   Exertional dyspnea 04/19/2023   Hyperlipidemia    Hypertension    Lower extremity edema 12/30/2020   Lumbar pain    fell off of her horse and has low back pain at times   Persistent atrial fibrillation (HCC) 12/30/2020   Venous insufficiency of both lower extremities    Vertigo     Objective:  Physical Exam: Vascular: DP/PT pulses 2/4 bilateral. CFT <3 seconds. Normal hair growth on digits. No edema.  Skin. No lacerations or abrasions bilateral feet. Left hallux nail is thickened and discolored yellow with some clearing noted to proximal nail border.  Musculoskeletal: MMT 5/5 bilateral lower extremities in DF, PF, Inversion and Eversion. Deceased ROM in DF of ankle joint.  Neurological: Sensation intact to light touch.   Assessment:   1. Onychomycosis          Plan:  Patient was evaluated and treated and all questions answered. -Examined patient -Discussed treatment options for painful dystrophic nails  -Culture positive for fungus -Discussed fungal nail treatment options including oral, topical, and laser treatments.  -Will try to swtich to jublia and see if this does better.  -Debrided fungal nail to patient comfort.   -Patient to return in 4 months for recheck  Jennefer Moats, DPM

## 2023-10-29 ENCOUNTER — Telehealth: Payer: Self-pay | Admitting: Podiatry

## 2023-10-29 NOTE — Telephone Encounter (Signed)
 Pt hasn't heard from [facility] . She needs to know if the jubilia has been ordered and the price.

## 2023-11-12 ENCOUNTER — Other Ambulatory Visit: Payer: Self-pay | Admitting: Podiatry

## 2023-11-12 MED ORDER — JUBLIA 10 % EX SOLN: 1.0000 [drp] | Freq: Every day | CUTANEOUS | 3 refills | Status: DC

## 2023-11-13 ENCOUNTER — Telehealth: Payer: Self-pay

## 2023-11-13 NOTE — Telephone Encounter (Signed)
 PA request received from [facility] pharmacy for Jublia  10% solution. PA submitted through covermymeds and waiting on response.  Robin [name]  (Key: [id number]) PA Case ID #: [id number] Rx #: [id number]

## 2023-11-14 NOTE — Telephone Encounter (Signed)
 PA for Jublia  was denied. It was denied Onychomycosis was not cause by Trichophyton rubrum or Trichophyton mentagrophytes.

## 2023-11-20 DIAGNOSIS — E559 Vitamin D deficiency, unspecified: Secondary | ICD-10-CM | POA: Diagnosis not present

## 2023-11-20 DIAGNOSIS — R9389 Abnormal findings on diagnostic imaging of other specified body structures: Secondary | ICD-10-CM | POA: Diagnosis not present

## 2023-11-20 DIAGNOSIS — M8580 Other specified disorders of bone density and structure, unspecified site: Secondary | ICD-10-CM | POA: Diagnosis not present

## 2023-11-20 DIAGNOSIS — I4819 Other persistent atrial fibrillation: Secondary | ICD-10-CM | POA: Diagnosis not present

## 2023-11-20 DIAGNOSIS — R748 Abnormal levels of other serum enzymes: Secondary | ICD-10-CM | POA: Diagnosis not present

## 2023-11-20 DIAGNOSIS — Z0000 Encounter for general adult medical examination without abnormal findings: Secondary | ICD-10-CM | POA: Diagnosis not present

## 2023-11-20 DIAGNOSIS — I63512 Cerebral infarction due to unspecified occlusion or stenosis of left middle cerebral artery: Secondary | ICD-10-CM | POA: Diagnosis not present

## 2023-11-20 DIAGNOSIS — R911 Solitary pulmonary nodule: Secondary | ICD-10-CM | POA: Diagnosis not present

## 2023-11-20 DIAGNOSIS — I700 Atherosclerosis of aorta: Secondary | ICD-10-CM | POA: Diagnosis not present

## 2023-11-20 LAB — LAB REPORT - SCANNED: EGFR: 75

## 2023-11-21 ENCOUNTER — Encounter: Payer: Self-pay | Admitting: Nurse Practitioner

## 2023-11-21 ENCOUNTER — Other Ambulatory Visit: Payer: Self-pay | Admitting: Family Medicine

## 2023-11-21 DIAGNOSIS — M8580 Other specified disorders of bone density and structure, unspecified site: Secondary | ICD-10-CM

## 2023-11-26 DIAGNOSIS — Z79890 Hormone replacement therapy (postmenopausal): Secondary | ICD-10-CM | POA: Diagnosis not present

## 2023-11-26 DIAGNOSIS — Z01419 Encounter for gynecological examination (general) (routine) without abnormal findings: Secondary | ICD-10-CM | POA: Diagnosis not present

## 2023-11-26 DIAGNOSIS — Z6823 Body mass index (BMI) 23.0-23.9, adult: Secondary | ICD-10-CM | POA: Diagnosis not present

## 2023-12-02 ENCOUNTER — Ambulatory Visit (INDEPENDENT_AMBULATORY_CARE_PROVIDER_SITE_OTHER)

## 2023-12-02 DIAGNOSIS — Z13820 Encounter for screening for osteoporosis: Secondary | ICD-10-CM | POA: Diagnosis not present

## 2023-12-02 DIAGNOSIS — M8580 Other specified disorders of bone density and structure, unspecified site: Secondary | ICD-10-CM

## 2023-12-02 DIAGNOSIS — M810 Age-related osteoporosis without current pathological fracture: Secondary | ICD-10-CM

## 2023-12-02 DIAGNOSIS — Z780 Asymptomatic menopausal state: Secondary | ICD-10-CM | POA: Diagnosis not present

## 2023-12-03 ENCOUNTER — Ambulatory Visit (HOSPITAL_BASED_OUTPATIENT_CLINIC_OR_DEPARTMENT_OTHER): Payer: Medicare PPO | Admitting: Cardiovascular Disease

## 2023-12-03 VITALS — BP 148/60 | HR 53 | Ht 64.0 in | Wt 135.4 lb

## 2023-12-03 DIAGNOSIS — I480 Paroxysmal atrial fibrillation: Secondary | ICD-10-CM | POA: Diagnosis not present

## 2023-12-03 DIAGNOSIS — I10 Essential (primary) hypertension: Secondary | ICD-10-CM

## 2023-12-03 DIAGNOSIS — Z5181 Encounter for therapeutic drug level monitoring: Secondary | ICD-10-CM | POA: Diagnosis not present

## 2023-12-03 DIAGNOSIS — E7800 Pure hypercholesterolemia, unspecified: Secondary | ICD-10-CM | POA: Diagnosis not present

## 2023-12-03 NOTE — Patient Instructions (Signed)
 Medication Instructions:  Your physician recommends that you continue on your current medications as directed. Please refer to the Current Medication list given to you today.   *If you need a refill on your cardiac medications before your next appointment, please call your pharmacy*  Lab Work: FASTING LP/CMET IN 6 MONTHS PRIOR TO FOLLOW UP   If you have labs (blood work) drawn today and your tests are completely normal, you will receive your results only by: MyChart Message (if you have MyChart) OR A paper copy in the mail If you have any lab test that is abnormal or we need to change your treatment, we will call you to review the results.  Testing/Procedures: Your physician has requested that you have an echocardiogram. Echocardiography is a painless test that uses sound waves to create images of your heart. It provides your doctor with information about the size and shape of your heart and how well your heart's chambers and valves are working. This procedure takes approximately one hour. There are no restrictions for this procedure. Please do NOT wear cologne, perfume, aftershave, or lotions (deodorant is allowed). Please arrive 15 minutes prior to your appointment time.  Please note: We ask at that you not bring children with you during ultrasound (echo/ vascular) testing. Due to room size and safety concerns, children are not allowed in the ultrasound rooms during exams. Our front office staff cannot provide observation of children in our lobby area while testing is being conducted. An adult accompanying a patient to their appointment will only be allowed in the ultrasound room at the discretion of the ultrasound technician under special circumstances. We apologize for any inconvenience  Follow-Up: At [facility], you and your health needs are our priority.  As part of our continuing mission to provide you with exceptional heart care, our providers are all part of one team.  This  team includes your primary Cardiologist (physician) and Advanced Practice Providers or APPs (Physician Assistants and Nurse Practitioners) who all work together to provide you with the care you need, when you need it.  Your next appointment:   6 month(s)  Provider:   Maudine Sos, MD, Slater Duncan, NP, or Neomi Banks, NP     We recommend signing up for the patient portal called MyChart.  Sign up information is provided on this After Visit Summary.  MyChart is used to connect with patients for Virtual Visits (Telemedicine).  Patients are able to view lab/test results, encounter notes, upcoming appointments, etc.  Non-urgent messages can be sent to your provider as well.   To learn more about what you can do with MyChart, go to [email or web address].

## 2023-12-03 NOTE — Progress Notes (Signed)
 " Cardiology Office Note:  .   Date:  12/22/2023  ID:  Robin [name], DOB [birth year], MRN [id number] PCP: Robin Pfeiffer, Robin [name]  [facility] Providers Cardiologist:  Robin Scarce, MD    History of Present Illness: .    Robin [name] is a 89 y.o. female with CVA,  hypertension, paroxysmal atrial fibrillation, hyperlipidemia and mild carotid stenosis who presents for follow-up.  She had a heart cath in 2009 that was normal.  Carotid Doppler 08/2013 revealed <50% stenosis bilaterally. She reported recurrent palpitations and wore a 30 day event monitor 10/2015 that showed occasional short episodes of atrial fibrillation.  She has been managed on Eliquis , diltiazem  and metoprolol .  She is not interested in statins.  Robin [name] reported episodes of near syncope.  She noted that her heart was irregular but in clinic she was found to have a wandering pacemaker.  She was referred for a Lexiscan  Myoview  02/2018 which revealed LVEF 81% and no ischemia.  Since that time she took a trip to the [place] and had a recurrent episode.  She discovered that she was actually having vertigo. She has intermittently struggled with chest pain that improved with belching. She previously had ankle edema that improved after a vein and vascular procedure. She had colon cancer and a laparoscopic hemicolectomy 10/2020. She followed up with her surgical team and was doing well.   Robin [name] followed up with Robin Finder, Robin [name] on 08/15/2021 and reported residual fatigue from her COVID infection 06/28/2021. Her dizziness had improved after starting to take her potassium in the evenings. It was noted that she was back on triamterene -HCTZ, although this had been switched to HCTZ alone during her admission in 03/2021.  At her appointment/2023 she was doing well and blood pressures were reasonably well-controlled.  She was maintaining sinus rhythm.  She saw Robin Finder, Robin [name] 11/2022 and reported more exertional dyspnea.   They discussed getting a coronary CTA but she decided to continue monitoring for the time being.   Robin [name] has questions about recent chest CT. She had undergone a CT scan to monitor lung nodules, which revealed plaque in the aorta and heart arteries. She was interested in understanding the implications of these findings and potential interventions.   Robin [name] reported a generally healthy diet, with minimal intake of fried foods and red meat, but regular consumption of cheese. She also reported daily walking, with a recent increase in distance and frequency. She noted improved breathing with increased exercise, but still experienced some shortness of breath during exertion. She had been advised to take antibiotics for a bronchial issue, but had decided to wait and see if symptoms worsened.   She had not been checking her blood pressure at home regularly, but a recent reading was 127/75. She reported no significant swelling in her legs or feet, and no shortness of breath when lying down. She did note some fluid accumulation in her ankles, which she attributed to increased walking.    Discussed the use of AI scribe software for clinical note transcription with the patient, who gave verbal consent to proceed.  History of Present Illness Robin [name] generally feels well and maintains an active lifestyle by walking daily, which she believes has contributed to her improvement. She experiences some difficulty with speech and word recall, attributing these issues to her previous stroke, though she notes that most people do not notice her difficulties.  Approximately one month ago, she experienced a nightmare during which her heart  rate increased to 145 bpm, as indicated by her watch. She also recalls a separate instance when her heart rate dropped to 30 bpm. Since then, her heart rate has been stable, ranging from 64 to 80 bpm. Her watch indicates she is in atrial fibrillation 98-99% of the time. She is  currently taking metoprolol , diltiazem , and Eliquis .  She is not currently taking atorvastatin  due to experiencing increased atrial fibrillation symptoms when on the medication. She is attempting to manage her cholesterol through diet and exercise.  Her blood pressure is generally low in the morning and at night, but has improved since getting a dog, which keeps her more active. Her blood pressure was 72 mmHg this morning. No chest pain or pressure. Occasional shortness of breath, which has improved with exercise. No recent episodes of atrial fibrillation that she is aware of, except for the one associated with a nightmare.  ROS:  As per HPI  Studies Reviewed: .       Lexiscan  Myoview  04/2023:   LV perfusion is normal. There is no evidence of ischemia. There is no evidence of infarction.   Left ventricular function is normal. Nuclear stress EF: 62%. The left ventricular ejection fraction is normal (55-65%). End diastolic cavity size is normal.   The study is normal. The study is low risk.  Echo 03/2021;  1. Left ventricular ejection fraction, by estimation, is 60 to 65%. The  left ventricle has normal function. The left ventricle has no regional  wall motion abnormalities. Left ventricular diastolic function could not  be evaluated.   2. Right ventricular systolic function is normal. The right ventricular  size is normal. There is mildly elevated pulmonary artery systolic  pressure.   3. Right atrial size was mild to moderately dilated.   4. The mitral valve is grossly normal. Trivial mitral valve  regurgitation.   5. The aortic valve is normal in structure. Aortic valve regurgitation is  not visualized.   Risk Assessment/Calculations:    CHA2DS2-VASc Score = 8   This indicates a 10.8% annual risk of stroke.  The patient's score is based upon: CHF History: 1 HTN History: 1 Diabetes History: 0 Stroke History: 2 Vascular Disease History: 1 Age Score: 2 Gender Score: 1        Physical Exam:   VS:  BP (!) 148/60   Pulse (!) 53   Ht 5' 4 (1.626 m)   Wt 135 lb 6.4 oz (61.4 kg)   SpO2 97%   BMI 23.24 kg/m  , BMI Body mass index is 23.24 kg/m. GENERAL:  Well appearing HEENT: Pupils equal round and reactive, fundi not visualized, oral mucosa unremarkable NECK:  No jugular venous distention, waveform within normal limits, carotid upstroke brisk and symmetric, no bruits, no thyromegaly LUNGS:  Clear to auscultation bilaterally HEART:  Irregularly irregular.  PMI not displaced or sustained,S1 and S2 within normal limits, no S3, no S4, no clicks, no rubs, no murmurs ABD:  Flat, positive bowel sounds normal in frequency in pitch, no bruits, no rebound, no guarding, no midline pulsatile mass, no hepatomegaly, no splenomegaly EXT:  2 plus pulses throughout, no edema, no cyanosis no clubbing SKIN:  No rashes no nodules NEURO:  Cranial nerves II through XII grossly intact, motor grossly intact throughout PSYCH:  Cognitively intact, oriented to person place and time   ASSESSMENT AND PLAN: .    Assessment & Plan # Atrial Fibrillation Chronic AFib with controlled heart rate, improved with exercise, and minimal symptoms. High  AFib burden noted on watch. She is minimally symptomatic.  No heart failure signs.  - Continue metoprolol  and diltiazem  for rate control. - Continue Eliquis  for anticoagulation. - Order echocardiogram to assess cardiac function.  # Stroke Residual mild speech difficulties. Emphasized aggressive cardiovascular risk management to prevent recurrence.  # Hyperlipidemia LDL at 81 mg/dL, close to target. Not on atorvastatin  due to AFib symptom concerns. Discussed non-statin options if needed. Aggressive lipid management crucial due to stroke history. - Recheck lipid panel in 4 months. - Consider non-statin lipid-lowering therapy if LDL remains elevated.         Dispo: F/U 6 Months  Signed, Robin Scarce, MD   "

## 2023-12-14 ENCOUNTER — Other Ambulatory Visit (HOSPITAL_BASED_OUTPATIENT_CLINIC_OR_DEPARTMENT_OTHER): Payer: Self-pay | Admitting: Cardiovascular Disease

## 2023-12-14 DIAGNOSIS — I4819 Other persistent atrial fibrillation: Secondary | ICD-10-CM

## 2023-12-22 ENCOUNTER — Encounter (HOSPITAL_BASED_OUTPATIENT_CLINIC_OR_DEPARTMENT_OTHER): Payer: Self-pay | Admitting: Cardiovascular Disease

## 2023-12-25 ENCOUNTER — Encounter: Payer: Self-pay | Admitting: Nurse Practitioner

## 2023-12-27 ENCOUNTER — Encounter: Payer: Self-pay | Admitting: Nurse Practitioner

## 2023-12-30 DIAGNOSIS — K591 Functional diarrhea: Secondary | ICD-10-CM | POA: Diagnosis not present

## 2023-12-30 DIAGNOSIS — Z85038 Personal history of other malignant neoplasm of large intestine: Secondary | ICD-10-CM | POA: Diagnosis not present

## 2024-01-01 ENCOUNTER — Other Ambulatory Visit (HOSPITAL_BASED_OUTPATIENT_CLINIC_OR_DEPARTMENT_OTHER)

## 2024-01-01 DIAGNOSIS — I480 Paroxysmal atrial fibrillation: Secondary | ICD-10-CM

## 2024-01-01 DIAGNOSIS — I10 Essential (primary) hypertension: Secondary | ICD-10-CM | POA: Diagnosis not present

## 2024-01-01 LAB — ECHOCARDIOGRAM COMPLETE
Area-P 1/2: 4.31 cm2
S' Lateral: 2.33 cm

## 2024-01-06 ENCOUNTER — Ambulatory Visit: Payer: Self-pay | Admitting: Cardiovascular Disease

## 2024-01-20 ENCOUNTER — Other Ambulatory Visit (HOSPITAL_BASED_OUTPATIENT_CLINIC_OR_DEPARTMENT_OTHER): Payer: Self-pay | Admitting: Family

## 2024-01-24 ENCOUNTER — Ambulatory Visit: Admitting: Podiatry

## 2024-01-24 ENCOUNTER — Encounter: Payer: Self-pay | Admitting: Podiatry

## 2024-01-24 DIAGNOSIS — B351 Tinea unguium: Secondary | ICD-10-CM | POA: Diagnosis not present

## 2024-01-24 MED ORDER — CICLOPIROX 8 % EX SOLN: Freq: Every day | CUTANEOUS | 0 refills | Status: AC

## 2024-01-24 NOTE — Progress Notes (Signed)
  Subjective:  Patient ID: Robin [name], female    DOB: Sep 07, 1934,   MRN: [id number]  Chief Complaint  Patient presents with   Nail Problem    It just looks the same.  I want to see if we can just give it some time and  see what it does on it's own.    89 y.o. female presents for follow-up of fungal nails. Relates she has been using the penlac   . Denies any other pedal complaints. Denies n/v/f/c.   Past Medical History:  Diagnosis Date   Anxiety    AV bloc first degree    Carotid stenosis 03/02/2017   Exertional dyspnea 04/19/2023   Hyperlipidemia    Hypertension    Lower extremity edema 12/30/2020   Lumbar pain    fell off of her horse and has low back pain at times   Persistent atrial fibrillation (HCC) 12/30/2020   Venous insufficiency of both lower extremities    Vertigo     Objective:  Physical Exam: Vascular: DP/PT pulses 2/4 bilateral. CFT <3 seconds. Normal hair growth on digits. No edema.  Skin. No lacerations or abrasions bilateral feet. Left hallux nail is thickened and discolored yellow with some clearing noted to proximal nail borderabout 1/2 way.  Musculoskeletal: MMT 5/5 bilateral lower extremities in DF, PF, Inversion and Eversion. Deceased ROM in DF of ankle joint.  Neurological: Sensation intact to light touch.   Assessment:   1. Onychomycosis           Plan:  Patient was evaluated and treated and all questions answered. -Examined patient -Discussed treatment options for painful dystrophic nails  -Culture positive for fungus -Discussed fungal nail treatment options including oral, topical, and laser treatments.  -Unable to get jublia  covered. Will continue with penlac .  -Debrided fungal nail to patient comfort.   -Patient to return in 4 months for recheck  Asberry Failing, DPM

## 2024-02-25 ENCOUNTER — Other Ambulatory Visit: Payer: Self-pay | Admitting: Family

## 2024-02-28 DIAGNOSIS — H401121 Primary open-angle glaucoma, left eye, mild stage: Secondary | ICD-10-CM | POA: Diagnosis not present

## 2024-02-28 DIAGNOSIS — H401112 Primary open-angle glaucoma, right eye, moderate stage: Secondary | ICD-10-CM | POA: Diagnosis not present

## 2024-04-13 ENCOUNTER — Other Ambulatory Visit (HOSPITAL_BASED_OUTPATIENT_CLINIC_OR_DEPARTMENT_OTHER): Payer: Self-pay | Admitting: Cardiovascular Disease

## 2024-04-13 DIAGNOSIS — I4819 Other persistent atrial fibrillation: Secondary | ICD-10-CM

## 2024-04-13 NOTE — Telephone Encounter (Signed)
 Prescription refill request for Eliquis  received. Indication: PAF Last office visit: 12/03/23  ONEIDA Scarce MD Scr: 0.77 on 11/20/23  Epic Age: 89 Weight: 61.4kg  Based on above findings Eliquis  5mg  twice daily is the appropriate dose.  Refill approved.

## 2024-04-30 DIAGNOSIS — L820 Inflamed seborrheic keratosis: Secondary | ICD-10-CM | POA: Diagnosis not present

## 2024-04-30 DIAGNOSIS — L538 Other specified erythematous conditions: Secondary | ICD-10-CM | POA: Diagnosis not present

## 2024-04-30 DIAGNOSIS — R208 Other disturbances of skin sensation: Secondary | ICD-10-CM | POA: Diagnosis not present

## 2024-04-30 DIAGNOSIS — Z85828 Personal history of other malignant neoplasm of skin: Secondary | ICD-10-CM | POA: Diagnosis not present

## 2024-04-30 DIAGNOSIS — Z789 Other specified health status: Secondary | ICD-10-CM | POA: Diagnosis not present

## 2024-04-30 DIAGNOSIS — Z08 Encounter for follow-up examination after completed treatment for malignant neoplasm: Secondary | ICD-10-CM | POA: Diagnosis not present

## 2024-04-30 DIAGNOSIS — Z86007 Personal history of in-situ neoplasm of skin: Secondary | ICD-10-CM | POA: Diagnosis not present

## 2024-05-29 ENCOUNTER — Ambulatory Visit: Admitting: Podiatry

## 2024-06-08 ENCOUNTER — Ambulatory Visit (HOSPITAL_BASED_OUTPATIENT_CLINIC_OR_DEPARTMENT_OTHER): Admitting: Cardiovascular Disease

## 2024-06-22 ENCOUNTER — Ambulatory Visit: Admitting: Podiatry

## 2024-06-24 LAB — LAB REPORT - SCANNED: EGFR: 82

## 2024-06-30 ENCOUNTER — Telehealth: Payer: Self-pay | Admitting: Cardiovascular Disease

## 2024-06-30 NOTE — Telephone Encounter (Signed)
 Pt states just recovered from pneumonia and PCP wants to know if Dr. Raford wants to see her sooner than 3/5. Pt would like a c/b regarding this matter, please advise.

## 2024-06-30 NOTE — Telephone Encounter (Signed)
 Left message for patient to call back or send an update in patient portal.  Scheduled now for 3/5 and on wait list.

## 2024-06-30 NOTE — Telephone Encounter (Signed)
 Patient returned RN's call.

## 2024-07-01 NOTE — Telephone Encounter (Signed)
 Patient recently treated for pneumonia  Had labs done 1/12 by PCP  BNP was 131.5, was told she may need to be seen sooner for follow up Has follow up scheduled 3/5 Did have some swelling in feet, resolved with Lasix  Still having some shortness of breath however stated is much better than was.  Advised patient to keep visit as scheduled, call back if worsening shortness of breath or swelling returns.   Will forward to Dr Raford for review

## 2024-07-01 NOTE — Telephone Encounter (Signed)
 Patient is calling to check status of concerns

## 2024-07-06 ENCOUNTER — Ambulatory Visit: Admitting: Podiatry

## 2024-07-06 ENCOUNTER — Ambulatory Visit: Payer: Self-pay | Admitting: Cardiovascular Disease

## 2024-07-06 DIAGNOSIS — B351 Tinea unguium: Secondary | ICD-10-CM | POA: Diagnosis not present

## 2024-07-06 NOTE — Addendum Note (Signed)
 Addended by: RINALDO DELON KIDD on: 07/06/2024 03:07 PM   Modules accepted: Orders

## 2024-07-06 NOTE — Progress Notes (Signed)
"  °  Subjective:  Patient ID: Robin [name], female    DOB: [birth year],   MRN: [id number]  Chief Complaint  Patient presents with   Nail Problem    L great toe nail has become very brittle and breaking off. It does not seem to be growing. Not diabetic. Eliquis     89 y.o. female presents for follow-up of fungal nails. Relates she has been using the penlac  and nail was doing fine now having trouble again. Here to re-evaluate.   . Denies any other pedal complaints. Denies n/v/f/c.   Past Medical History:  Diagnosis Date   Anxiety    AV bloc first degree    Carotid stenosis 03/02/2017   Exertional dyspnea 04/19/2023   Hyperlipidemia    Hypertension    Lower extremity edema 12/30/2020   Lumbar pain    fell off of her horse and has low back pain at times   Persistent atrial fibrillation (HCC) 12/30/2020   Venous insufficiency of both lower extremities    Vertigo     Objective:  Physical Exam: Vascular: DP/PT pulses 2/4 bilateral. CFT <3 seconds. Normal hair growth on digits. No edema.  Skin. No lacerations or abrasions bilateral feet. Left hallux nail is thickened and discolored yellow with some clearing noted to proximal nail borderabout 1/2 way.  Musculoskeletal: MMT 5/5 bilateral lower extremities in DF, PF, Inversion and Eversion. Deceased ROM in DF of ankle joint.  Neurological: Sensation intact to light touch.   Assessment:   1. Onychomycosis            Plan:  Patient was evaluated and treated and all questions answered. -Examined patient -Discussed treatment options for painful dystrophic nails  -Given continued issues and a years since last check will plan for culture to re-evaluate -Clinical picture and Fungal culture was obtained by removing a portion of the hard nail itself from each of the involved toenails using a sterile nail nipper and sent to [facility] lab. Patient tolerated the biopsy procedure well without discomfort or need for anesthesia.  -Discussed  fungal nail treatment options including oral, topical, and laser treatments.  -Patient to return in 4 weeks for follow up evaluation and discussion of fungal culture results or sooner if symptoms worsen.   Asberry Failing, DPM   "

## 2024-08-07 ENCOUNTER — Ambulatory Visit: Admitting: Podiatry

## 2024-08-20 ENCOUNTER — Ambulatory Visit (HOSPITAL_BASED_OUTPATIENT_CLINIC_OR_DEPARTMENT_OTHER): Admitting: Cardiovascular Disease
